# Patient Record
Sex: Male | Born: 1945
Health system: Southern US, Community
[De-identification: ages and names within clinical notes are randomized; demographics above are authoritative.]

## PROBLEM LIST (undated history)

## (undated) DIAGNOSIS — Z8739 Personal history of other diseases of the musculoskeletal system and connective tissue: Secondary | ICD-10-CM

## (undated) DIAGNOSIS — R109 Unspecified abdominal pain: Secondary | ICD-10-CM

## (undated) DIAGNOSIS — I10 Essential (primary) hypertension: Secondary | ICD-10-CM

## (undated) DIAGNOSIS — M5136 Other intervertebral disc degeneration, lumbar region: Secondary | ICD-10-CM

## (undated) DIAGNOSIS — C801 Malignant (primary) neoplasm, unspecified: Secondary | ICD-10-CM

## (undated) DIAGNOSIS — K219 Gastro-esophageal reflux disease without esophagitis: Secondary | ICD-10-CM

## (undated) DIAGNOSIS — I2510 Atherosclerotic heart disease of native coronary artery without angina pectoris: Secondary | ICD-10-CM

## (undated) DIAGNOSIS — N201 Calculus of ureter: Secondary | ICD-10-CM

## (undated) DIAGNOSIS — H9190 Unspecified hearing loss, unspecified ear: Secondary | ICD-10-CM

## (undated) DIAGNOSIS — M545 Low back pain, unspecified: Secondary | ICD-10-CM

## (undated) DIAGNOSIS — H269 Unspecified cataract: Secondary | ICD-10-CM

## (undated) DIAGNOSIS — R0781 Pleurodynia: Secondary | ICD-10-CM

## (undated) DIAGNOSIS — M1990 Unspecified osteoarthritis, unspecified site: Secondary | ICD-10-CM

## (undated) DIAGNOSIS — Z973 Presence of spectacles and contact lenses: Secondary | ICD-10-CM

## (undated) DIAGNOSIS — K649 Unspecified hemorrhoids: Secondary | ICD-10-CM

## (undated) DIAGNOSIS — D649 Anemia, unspecified: Secondary | ICD-10-CM

## (undated) DIAGNOSIS — Z87442 Personal history of urinary calculi: Secondary | ICD-10-CM

## (undated) DIAGNOSIS — G7000 Myasthenia gravis without (acute) exacerbation: Secondary | ICD-10-CM

## (undated) DIAGNOSIS — N200 Calculus of kidney: Secondary | ICD-10-CM

## (undated) DIAGNOSIS — E782 Mixed hyperlipidemia: Secondary | ICD-10-CM

## (undated) DIAGNOSIS — I119 Hypertensive heart disease without heart failure: Secondary | ICD-10-CM

## (undated) DIAGNOSIS — K921 Melena: Secondary | ICD-10-CM

## (undated) DIAGNOSIS — E785 Hyperlipidemia, unspecified: Secondary | ICD-10-CM

## (undated) DIAGNOSIS — M51369 Other intervertebral disc degeneration, lumbar region without mention of lumbar back pain or lower extremity pain: Secondary | ICD-10-CM

## (undated) DIAGNOSIS — I4891 Unspecified atrial fibrillation: Secondary | ICD-10-CM

## (undated) DIAGNOSIS — Z8619 Personal history of other infectious and parasitic diseases: Secondary | ICD-10-CM

## (undated) DIAGNOSIS — M722 Plantar fascial fibromatosis: Secondary | ICD-10-CM

## (undated) HISTORY — PX: VASECTOMY: SHX75

## (undated) HISTORY — DX: Unspecified atrial fibrillation: I48.91

## (undated) HISTORY — DX: Plantar fascial fibromatosis: M72.2

## (undated) HISTORY — PX: JOINT REPLACEMENT: SHX530

## (undated) HISTORY — PX: COLONOSCOPY: SHX174

## (undated) HISTORY — PX: OTHER SURGICAL HISTORY: SHX169

## (undated) HISTORY — DX: Low back pain: M54.5

## (undated) HISTORY — DX: Hypertensive heart disease without heart failure: I11.9

## (undated) HISTORY — DX: Myasthenia gravis without (acute) exacerbation: G70.00

## (undated) HISTORY — DX: Pleurodynia: R07.81

## (undated) HISTORY — DX: Mixed hyperlipidemia: E78.2

## (undated) HISTORY — PX: TOTAL KNEE ARTHROPLASTY: SHX125

## (undated) HISTORY — PX: BACK SURGERY: SHX140

## (undated) HISTORY — DX: Melena: K92.1

## (undated) HISTORY — DX: Personal history of other infectious and parasitic diseases: Z86.19

## (undated) HISTORY — DX: Essential (primary) hypertension: I10

## (undated) HISTORY — PX: LITHOTRIPSY: SUR834

## (undated) HISTORY — DX: Atherosclerotic heart disease of native coronary artery without angina pectoris: I25.10

## (undated) HISTORY — DX: Low back pain, unspecified: M54.50

---
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
## 1998-09-06 ENCOUNTER — Ambulatory Visit (HOSPITAL_COMMUNITY): Admission: RE | Admit: 1998-09-06 | Discharge: 1998-09-06 | Payer: Self-pay | Admitting: *Deleted

## 1999-12-24 ENCOUNTER — Ambulatory Visit (HOSPITAL_COMMUNITY): Admission: RE | Admit: 1999-12-24 | Discharge: 1999-12-24 | Payer: Self-pay | Admitting: Urology

## 1999-12-24 ENCOUNTER — Encounter: Payer: Self-pay | Admitting: Urology

## 2001-04-13 ENCOUNTER — Encounter: Payer: Self-pay | Admitting: Orthopedic Surgery

## 2001-04-18 ENCOUNTER — Inpatient Hospital Stay (HOSPITAL_COMMUNITY): Admission: RE | Admit: 2001-04-18 | Discharge: 2001-04-28 | Payer: Self-pay | Admitting: Orthopedic Surgery

## 2001-04-18 HISTORY — PX: TOTAL KNEE ARTHROPLASTY: SHX125

## 2001-04-23 ENCOUNTER — Encounter: Payer: Self-pay | Admitting: Orthopedic Surgery

## 2001-04-24 ENCOUNTER — Encounter: Payer: Self-pay | Admitting: Interventional Cardiology

## 2001-05-15 ENCOUNTER — Encounter: Admission: RE | Admit: 2001-05-15 | Discharge: 2001-06-14 | Payer: Self-pay | Admitting: Orthopedic Surgery

## 2004-10-26 ENCOUNTER — Ambulatory Visit (HOSPITAL_COMMUNITY): Admission: RE | Admit: 2004-10-26 | Discharge: 2004-10-26 | Payer: Self-pay | Admitting: Internal Medicine

## 2004-10-26 ENCOUNTER — Ambulatory Visit: Payer: Self-pay | Admitting: Internal Medicine

## 2005-01-13 ENCOUNTER — Ambulatory Visit (HOSPITAL_COMMUNITY): Admission: RE | Admit: 2005-01-13 | Discharge: 2005-01-13 | Payer: Self-pay | Admitting: Pulmonary Disease

## 2005-10-18 ENCOUNTER — Encounter: Admission: RE | Admit: 2005-10-18 | Discharge: 2005-10-18 | Payer: Self-pay | Admitting: Surgery

## 2009-07-09 ENCOUNTER — Ambulatory Visit (HOSPITAL_COMMUNITY): Admission: RE | Admit: 2009-07-09 | Discharge: 2009-07-09 | Payer: Self-pay | Admitting: Pulmonary Disease

## 2010-09-06 ENCOUNTER — Encounter: Payer: Self-pay | Admitting: Pulmonary Disease

## 2011-01-01 NOTE — Op Note (Signed)
[place]  Patient:    Brandon [name], MESA Visit Number: [id number] MRN: [id number]          Service Type: SUR Location: RCRM 2550 03 Attending Physician:  Cornell Barman Proc. Date: 04/18/01 Admit Date:  04/18/2001                             Operative Report  PREOPERATIVE DIAGNOSIS:  Severe osteoarthritis left knee.  POSTOPERATIVE DIAGNOSIS:  Severe osteoarthritis left knee.  PROCEDURE PERFORMED:  Left total knee replacement on the left using an LCS large size femoral component with a size 5 cemented keel tray with a 10 mm poly insert and a three peg cemented rotating patella large size.  SURGEON:  Lenard Galloway. Mortenson, M.D.  ASSISTANT:  ANESTHESIA:  General.  DESCRIPTION OF PROCEDURE:  After satisfactory general anesthesia, the patient was placed on the operating table in the supine position with a pneumatic tourniquet to the left upper thigh.  The entire left lower extremity was prepped with Duraprep and draped in the usual manner.  The leg was wrapped with an Esmarch and the tourniquet elevated.  A straight incision was made starting above the patella and carried down to the tibial tubercle.  Skin edges were retracted.  Bleeders were coagulated.  A long medium parapatellar incision was made with the Bovie.  The patella was everted and bone spurs removed from both femoral condyles and enlarged inside the patella.  The knee was then flexed at 90 degrees.  Both medial and lateral meniscus were excised. A tibial guide #1 was passed over the anterior aspect of the tibia and this was felt to be the proper cutting level.  The cutting guide was then fixed with fixation pins.  The external tunneler was applied and excellent alignment on the tibia was achieved.  At this point, the capture guide was used and the proximal tibia was amputated.  Excellent cut of the tibia was achieved.  All bone was removed.  The meniscus, anterior, and  posterior cruciate ligaments were totally released.  Excellent access was achieved.  Attention was then turned to the femur.  A local edge synovectomy with severe pouch was done.  The notch was cut over the anterior aspect of the femur and a femoral guide #1 was placed over the anterior aspect of the femur with the distal end.  This was held in position and a drill hole was made.  The intermedullary rod was inserted.  A C clamp was inserted and the knee flexed to 90 degrees and this set up a proper rotation of the femoral component. This was then locked into position with fixation pins.  Using the capture guide anterior and posterior cuts on the distal femur was achieved.  At this point, a 10 mm spacer block was inserted between the femur and the tibia with the knee flexed and this seated very nicely with excellent balancing of both medial and lateral collateral ligaments.  Guide #2 was placed over the distal end of the femur and held in position with the intermedullary rod.  Once this was seated, fixation pins were inserted.  The intermedullary rod was removed, the knee flexed, the capture guide applied and the femur was amputated.  With the knee fully extended, a spacer block was inserted and in full extension, there was excellent balancing of both the medial and lateral collateral ligaments, and they balanced nicely with the ligaments in  flexion.  Femoral guide #3 was placed over the distal end of the femur, fixed in place with fixation pins, and Chamfer cuts were made and drill holes made.  The guide was then removed and all loose bone was removed.  A McKale retractor was then used to sublux the tibia forward and placed laterally.  This gave excellent access to the proximal tibia.  A size 5 tibial tray seemed to fit very nicely.  This was held in position and fixed with fixation pins.  Power was applied and impacted.  Using the verse drill, a hole was made for the keel.  The  tunneler was removed and this Finn device was inserted and placement made before the finn.  The trial poly insert was inserted in place.  A femoral trial was placed over this to end the femur and this seated very nicely.  The knee was then put through a full range of motion.  There was excellent stability, flexion, and extension.  No drawer was noted.  Excellent balance of the femoral ligaments.  On the posterior aspect of the patella, which was amputated using the patella guide.  Three drill holes were placed in the posterior aspect of the patella and patella trial was inserted.  The knee was then put through a range of motion once again.  A lateral release was not done as the patella tracked very nicely and there was no lateral tilt.  All the components were removed.  Saline lavage was used in the anterior solution. All debris was removed.  Superficial bleeders were coagulated.  Glue was mixed.  Each component was then sequentially inserted starting with the tibia then trial tibial tray and glue was placed over the distal end of the femur and the femoral component was inserted.  All excess glue was removed.  With the fully bended, glue was placed on the posterior aspect of the patella.  The patella prosthesis was placed with the clamp.  All excess glue was removed from all the components.  Once the glue had hardened, a small osteotome was used to remove excess glue.  Excellent position and balancing of all the ligaments and the components was achieved.  The trial poly was then removed. Tourniquet dropped and all bleeders coagulated.  The knee was then irrigated again with pulse saline lavage and antibiotic solution.  The final poly was then snap fitted in place and the knee put through a full range of motion.  a Hemovac drain was inserted.  With the knee flexed 90 degrees, the long median parapatellar incision was closed with interrupted Ethibond sutures.  Vicryl was used to close the  subcutaneous tissue and stainless steel staples were used to close the skin.  Drains were one Hemovac.  Complications none. Attending Physician:  Cornell Barman DD:  04/18/01 TD:  04/18/01 Job: [id number]  [id number]
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
## 2011-01-08 ENCOUNTER — Other Ambulatory Visit: Payer: Self-pay | Admitting: Neurosurgery

## 2011-01-08 DIAGNOSIS — M47816 Spondylosis without myelopathy or radiculopathy, lumbar region: Secondary | ICD-10-CM

## 2011-01-13 ENCOUNTER — Ambulatory Visit
Admission: RE | Admit: 2011-01-13 | Discharge: 2011-01-13 | Disposition: A | Discharge status: Home / Self Care | Payer: Medicare Other | Source: Ambulatory Visit | Attending: Neurosurgery | Admitting: Neurosurgery

## 2011-01-13 DIAGNOSIS — M47816 Spondylosis without myelopathy or radiculopathy, lumbar region: Secondary | ICD-10-CM

## 2011-03-09 ENCOUNTER — Other Ambulatory Visit: Payer: Self-pay | Admitting: Neurosurgery

## 2011-03-09 DIAGNOSIS — M47816 Spondylosis without myelopathy or radiculopathy, lumbar region: Secondary | ICD-10-CM

## 2011-04-26 ENCOUNTER — Ambulatory Visit
Admission: RE | Admit: 2011-04-26 | Discharge: 2011-04-26 | Disposition: A | Discharge status: Home / Self Care | Payer: Medicare Other | Source: Ambulatory Visit | Attending: Neurosurgery | Admitting: Neurosurgery

## 2011-04-26 DIAGNOSIS — M47816 Spondylosis without myelopathy or radiculopathy, lumbar region: Secondary | ICD-10-CM

## 2011-04-26 MED ORDER — IOHEXOL 180 MG/ML  SOLN
1.0000 mL | Freq: Once | INTRAMUSCULAR | Status: AC | PRN
  Administered 2011-04-26: 1 mL via EPIDURAL

## 2011-04-26 MED ORDER — METHYLPREDNISOLONE ACETATE 40 MG/ML INJ SUSP (RADIOLOG
120.0000 mg | Freq: Once | INTRAMUSCULAR | Status: AC
  Administered 2011-04-26: 120 mg via EPIDURAL

## 2011-08-25 DIAGNOSIS — Z0000 Encounter for general adult medical examination without abnormal findings: Secondary | ICD-10-CM | POA: Diagnosis not present

## 2011-08-25 DIAGNOSIS — Z125 Encounter for screening for malignant neoplasm of prostate: Secondary | ICD-10-CM | POA: Diagnosis not present

## 2011-08-30 DIAGNOSIS — M25539 Pain in unspecified wrist: Secondary | ICD-10-CM | POA: Diagnosis not present

## 2011-08-30 DIAGNOSIS — M542 Cervicalgia: Secondary | ICD-10-CM | POA: Diagnosis not present

## 2011-08-30 DIAGNOSIS — G5600 Carpal tunnel syndrome, unspecified upper limb: Secondary | ICD-10-CM | POA: Diagnosis not present

## 2011-09-07 DIAGNOSIS — M47817 Spondylosis without myelopathy or radiculopathy, lumbosacral region: Secondary | ICD-10-CM | POA: Diagnosis not present

## 2011-09-07 DIAGNOSIS — M4310 Spondylolisthesis, site unspecified: Secondary | ICD-10-CM | POA: Diagnosis not present

## 2011-09-16 DIAGNOSIS — G5600 Carpal tunnel syndrome, unspecified upper limb: Secondary | ICD-10-CM | POA: Diagnosis not present

## 2011-11-09 DIAGNOSIS — M25519 Pain in unspecified shoulder: Secondary | ICD-10-CM | POA: Diagnosis not present

## 2011-11-09 DIAGNOSIS — S4350XA Sprain of unspecified acromioclavicular joint, initial encounter: Secondary | ICD-10-CM | POA: Diagnosis not present

## 2011-11-10 DIAGNOSIS — S4350XA Sprain of unspecified acromioclavicular joint, initial encounter: Secondary | ICD-10-CM | POA: Diagnosis not present

## 2011-11-15 ENCOUNTER — Encounter (HOSPITAL_COMMUNITY): Payer: Self-pay

## 2011-11-23 NOTE — Pre-Procedure Instructions (Signed)
20 Brandon [name]   11/23/2011   Your procedure is scheduled on:  Friday, April 12th   Report to [facility] at  10:30  AM.   Call this number if you have problems the morning of surgery: [phone]   Remember:   Do not eat food:After Midnight Thursday .  May have clear liquids: up to 4 Hours before arrival time.Marland Kitchen6:30 AM.  Clear liquids include soda, tea, black coffee, apple or grape juice, broth.   Take these medicines the morning of surgery with A SIP OF WATER:  Lopressor, omeprazole   Do not wear jewelry, make-up or nail polish.   Do not wear lotions, powders, or perfumes. You may wear deodorant.  Do not shave 48 hours prior to surgery.  Do not bring valuables to the hospital.   Contacts, dentures or bridgework may not be worn into surgery.  Leave suitcase in the car. After surgery it may be brought to your room.  For patients admitted to the hospital, checkout time is 11:00 AM the day of discharge.   Patients discharged the day of surgery will not be allowed to drive home.  Name and phone number of your driver:   Special Instructions: CHG Shower Use Special Wash: 1/2 bottle night before surgery and 1/2 bottle morning of surgery.   Please read over the following fact sheets that you were given: Pain Booklet, MRSA Information and Surgical Site Infection Prevention

## 2011-11-23 NOTE — H&P (Signed)
CC: left shoulder pain and weakness HPI: 66 y/o male with worsening left shoulder pain and weakness due to torn rotator cuff, pt has elected for a left shoulder scope and attempt for cuff repair PMH: hypertension, gout, kidney stones, GERD Allergy: NKDA Meds: HCTZ, lopressor, pravachol, ompeprazole, vitamins, aspirin, fish oil, potassium, allopurinol, mobic, tylenol  Family: cancer, chf, coronary heart disease Social: occasional etoh, no illicit drugs, former tobacco ROS: pain and weakness left shoulder s/p cuff tear PE: alert and appropriate 65 y/o male in no acute distress Cervical: full rom cranial nerves 2-12 intact Left shoulder: FF 90 ER 30 nv intact distally, weakness ER 3/5 as compared to right  X-rays: left shoulder rotator cuff tear Assessment: left shoulder rotator cuff tear Plan: left shoulder scope and attempt at rotator cuff repair

## 2011-11-24 ENCOUNTER — Encounter (HOSPITAL_COMMUNITY)
Admission: RE | Admit: 2011-11-24 | Discharge: 2011-11-24 | Disposition: A | Discharge status: Home / Self Care | Payer: Medicare Other | Source: Ambulatory Visit | Attending: Orthopedic Surgery | Admitting: Orthopedic Surgery

## 2011-11-24 ENCOUNTER — Encounter (HOSPITAL_COMMUNITY): Payer: Self-pay

## 2011-11-24 ENCOUNTER — Encounter (HOSPITAL_COMMUNITY)
Admission: RE | Admit: 2011-11-24 | Discharge: 2011-11-24 | Disposition: A | Discharge status: Home / Self Care | Payer: Medicare Other | Source: Ambulatory Visit | Attending: Anesthesiology | Admitting: Anesthesiology

## 2011-11-24 DIAGNOSIS — Z01812 Encounter for preprocedural laboratory examination: Secondary | ICD-10-CM | POA: Diagnosis not present

## 2011-11-24 DIAGNOSIS — Z01818 Encounter for other preprocedural examination: Secondary | ICD-10-CM | POA: Diagnosis not present

## 2011-11-24 DIAGNOSIS — M25819 Other specified joint disorders, unspecified shoulder: Secondary | ICD-10-CM | POA: Diagnosis not present

## 2011-11-24 DIAGNOSIS — IMO0001 Reserved for inherently not codable concepts without codable children: Secondary | ICD-10-CM | POA: Diagnosis not present

## 2011-11-24 DIAGNOSIS — M719 Bursopathy, unspecified: Secondary | ICD-10-CM | POA: Diagnosis not present

## 2011-11-24 DIAGNOSIS — M67919 Unspecified disorder of synovium and tendon, unspecified shoulder: Secondary | ICD-10-CM | POA: Diagnosis not present

## 2011-11-24 DIAGNOSIS — Z01810 Encounter for preprocedural cardiovascular examination: Secondary | ICD-10-CM | POA: Diagnosis not present

## 2011-11-24 DIAGNOSIS — Z5333 Arthroscopic surgical procedure converted to open procedure: Secondary | ICD-10-CM | POA: Diagnosis not present

## 2011-11-24 DIAGNOSIS — M19019 Primary osteoarthritis, unspecified shoulder: Secondary | ICD-10-CM | POA: Diagnosis not present

## 2011-11-24 DIAGNOSIS — I10 Essential (primary) hypertension: Secondary | ICD-10-CM | POA: Diagnosis not present

## 2011-11-24 DIAGNOSIS — Z01811 Encounter for preprocedural respiratory examination: Secondary | ICD-10-CM | POA: Diagnosis not present

## 2011-11-24 DIAGNOSIS — K219 Gastro-esophageal reflux disease without esophagitis: Secondary | ICD-10-CM | POA: Diagnosis not present

## 2011-11-24 DIAGNOSIS — I7090 Unspecified atherosclerosis: Secondary | ICD-10-CM | POA: Diagnosis not present

## 2011-11-24 HISTORY — DX: Other intervertebral disc degeneration, lumbar region: M51.36

## 2011-11-24 HISTORY — DX: Unspecified osteoarthritis, unspecified site: M19.90

## 2011-11-24 HISTORY — DX: Other intervertebral disc degeneration, lumbar region without mention of lumbar back pain or lower extremity pain: M51.369

## 2011-11-24 HISTORY — DX: Gastro-esophageal reflux disease without esophagitis: K21.9

## 2011-11-24 LAB — BASIC METABOLIC PANEL
BUN: 21 mg/dL (ref 6–23)
CO2: 28 mEq/L (ref 19–32)
Calcium: 9.6 mg/dL (ref 8.4–10.5)
Chloride: 103 mEq/L (ref 96–112)
Creatinine, Ser: 0.8 mg/dL (ref 0.50–1.35)
GFR calc Af Amer: >90 mL/min (ref >90)
GFR calc non Af Amer: >90 mL/min (ref >90)
Glucose, Bld: 109 mg/dL — ABNORMAL HIGH (ref 70–99)
Potassium: 3.2 mEq/L — ABNORMAL LOW (ref 3.5–5.1)
Sodium: 141 mEq/L (ref 135–145)

## 2011-11-24 LAB — CBC
HCT: 44.1 % (ref 39.0–52.0)
Hemoglobin: 15.1 g/dL (ref 13.0–17.0)
MCH: 31 pg (ref 26.0–34.0)
MCHC: 34.2 g/dL (ref 30.0–36.0)
MCV: 90.6 fL (ref 78.0–100.0)
Platelets: 188 10*3/uL (ref 150–400)
RBC: 4.87 MIL/uL (ref 4.22–5.81)
RDW: 13.5 % (ref 11.5–15.5)
WBC: 5.3 10*3/uL (ref 4.0–10.5)

## 2011-11-24 LAB — SURGICAL PCR SCREEN
MRSA, PCR: NEGATIVE
Staphylococcus aureus: POSITIVE — AB

## 2011-11-24 MED ORDER — CHLORHEXIDINE GLUCONATE 4 % EX LIQD: 60.0000 mL | Freq: Once | CUTANEOUS | Status: DC

## 2011-11-25 MED ORDER — CEFAZOLIN SODIUM-DEXTROSE 2-3 GM-% IV SOLR
2.0000 g | INTRAVENOUS | Status: DC
  Filled 2011-11-25: qty 50

## 2011-11-25 NOTE — Consult Note (Addendum)
Anesthesia:  Patient is a 66 year old male scheduled for a left shoulder arthroscopy, rotator cuff repair on 11/26/11. History includes former smoker, CKD (with normal BUN/CR 11/24/11), kidney stones, HTN, GERD, DDD, arthritis.  Labs acceptable.  CXR from 11/24/11 shows: No acute infiltrate or pleural effusion. No pulmonary edema.  There is probable pleural scarring or rib calcification in the  right upper lobe. I cannot exclude prior injury of the right first  rib. Clinical correlation is necessary. Follow-up examination in  2 months is recommended to assure stability. Mild atherosclerotic calcifications of the thoracic aorta. Defer instructions for further evaluation to Dr. Ranell Patrick.  EKG from 11/24/11 shows SB @ 52bpm, possible inferior infarct.  Inferior changes have been present since at least 2002, and had a normal stress test at that time, EF 58%.  No CV symptoms reported.  No documented history of CAD/CHF or DM.  Anticipate he can proceed as planned.

## 2011-11-26 ENCOUNTER — Encounter (HOSPITAL_COMMUNITY)
Admission: RE | Disposition: A | Discharge status: Home / Self Care | Payer: Self-pay | Source: Ambulatory Visit | Attending: Orthopedic Surgery

## 2011-11-26 ENCOUNTER — Encounter (HOSPITAL_COMMUNITY): Payer: Self-pay | Admitting: Vascular Surgery

## 2011-11-26 ENCOUNTER — Ambulatory Visit (HOSPITAL_COMMUNITY): Payer: Medicare Other | Admitting: Vascular Surgery

## 2011-11-26 ENCOUNTER — Ambulatory Visit (HOSPITAL_COMMUNITY)
Admission: RE | Admit: 2011-11-26 | Discharge: 2011-11-26 | Disposition: A | Discharge status: Home / Self Care | Payer: Medicare Other | Source: Ambulatory Visit | Attending: Orthopedic Surgery | Admitting: Orthopedic Surgery

## 2011-11-26 DIAGNOSIS — X58XXXS Exposure to other specified factors, sequela: Secondary | ICD-10-CM | POA: Insufficient documentation

## 2011-11-26 DIAGNOSIS — M719 Bursopathy, unspecified: Secondary | ICD-10-CM | POA: Diagnosis not present

## 2011-11-26 DIAGNOSIS — M19019 Primary osteoarthritis, unspecified shoulder: Secondary | ICD-10-CM | POA: Insufficient documentation

## 2011-11-26 DIAGNOSIS — Z01811 Encounter for preprocedural respiratory examination: Secondary | ICD-10-CM | POA: Insufficient documentation

## 2011-11-26 DIAGNOSIS — Z5333 Arthroscopic surgical procedure converted to open procedure: Secondary | ICD-10-CM | POA: Insufficient documentation

## 2011-11-26 DIAGNOSIS — Z01810 Encounter for preprocedural cardiovascular examination: Secondary | ICD-10-CM | POA: Insufficient documentation

## 2011-11-26 DIAGNOSIS — Z01812 Encounter for preprocedural laboratory examination: Secondary | ICD-10-CM | POA: Insufficient documentation

## 2011-11-26 DIAGNOSIS — M75110 Incomplete rotator cuff tear or rupture of unspecified shoulder, not specified as traumatic: Secondary | ICD-10-CM | POA: Diagnosis not present

## 2011-11-26 DIAGNOSIS — S43439A Superior glenoid labrum lesion of unspecified shoulder, initial encounter: Secondary | ICD-10-CM | POA: Diagnosis not present

## 2011-11-26 DIAGNOSIS — G8918 Other acute postprocedural pain: Secondary | ICD-10-CM | POA: Diagnosis not present

## 2011-11-26 DIAGNOSIS — K219 Gastro-esophageal reflux disease without esophagitis: Secondary | ICD-10-CM | POA: Insufficient documentation

## 2011-11-26 DIAGNOSIS — Z01818 Encounter for other preprocedural examination: Secondary | ICD-10-CM | POA: Insufficient documentation

## 2011-11-26 DIAGNOSIS — M67919 Unspecified disorder of synovium and tendon, unspecified shoulder: Secondary | ICD-10-CM | POA: Diagnosis not present

## 2011-11-26 DIAGNOSIS — M7500 Adhesive capsulitis of unspecified shoulder: Secondary | ICD-10-CM | POA: Diagnosis not present

## 2011-11-26 DIAGNOSIS — M25819 Other specified joint disorders, unspecified shoulder: Secondary | ICD-10-CM | POA: Insufficient documentation

## 2011-11-26 DIAGNOSIS — IMO0001 Reserved for inherently not codable concepts without codable children: Secondary | ICD-10-CM | POA: Insufficient documentation

## 2011-11-26 DIAGNOSIS — M66329 Spontaneous rupture of flexor tendons, unspecified upper arm: Secondary | ICD-10-CM | POA: Diagnosis not present

## 2011-11-26 DIAGNOSIS — I10 Essential (primary) hypertension: Secondary | ICD-10-CM | POA: Insufficient documentation

## 2011-11-26 HISTORY — PX: BICEPT TENODESIS: SHX5116

## 2011-11-26 SURGERY — SHOULDER ARTHROSCOPY WITH OPEN ROTATOR CUFF REPAIR AND DISTAL CLAVICLE ACROMINECTOMY
Anesthesia: General | Site: Shoulder | Laterality: Left | Wound class: Clean

## 2011-11-26 MED ORDER — NEOSTIGMINE METHYLSULFATE 1 MG/ML IJ SOLN
INTRAMUSCULAR | Status: DC | PRN
  Administered 2011-11-26: 2.5 mg via INTRAVENOUS

## 2011-11-26 MED ORDER — BUPIVACAINE-EPINEPHRINE 0.25% -1:200000 IJ SOLN
INTRAMUSCULAR | Status: DC | PRN
  Administered 2011-11-26: 6 mL

## 2011-11-26 MED ORDER — METHOCARBAMOL 500 MG PO TABS: 500.0000 mg | ORAL_TABLET | Freq: Three times a day (TID) | ORAL | Status: AC | PRN

## 2011-11-26 MED ORDER — LACTATED RINGERS IV SOLN
INTRAVENOUS | Status: DC
  Administered 2011-11-26: 12:00:00 via INTRAVENOUS

## 2011-11-26 MED ORDER — FENTANYL CITRATE 0.05 MG/ML IJ SOLN
INTRAMUSCULAR | Status: AC
  Filled 2011-11-26: qty 2

## 2011-11-26 MED ORDER — ONDANSETRON HCL 4 MG/2ML IJ SOLN
INTRAMUSCULAR | Status: DC | PRN
  Administered 2011-11-26: 4 mg via INTRAVENOUS

## 2011-11-26 MED ORDER — SODIUM CHLORIDE 0.9 % IV SOLN: INTRAVENOUS | Status: DC

## 2011-11-26 MED ORDER — SUFENTANIL CITRATE 50 MCG/ML IV SOLN
INTRAVENOUS | Status: DC | PRN
  Administered 2011-11-26: 10 ug via INTRAVENOUS
  Administered 2011-11-26: 20 ug via INTRAVENOUS

## 2011-11-26 MED ORDER — SODIUM CHLORIDE 0.9 % IR SOLN
Status: DC | PRN
  Administered 2011-11-26 (×2): 3000 mL
  Administered 2011-11-26: 1000 mL
  Administered 2011-11-26: 3000 mL

## 2011-11-26 MED ORDER — MIDAZOLAM HCL 2 MG/2ML IJ SOLN
INTRAMUSCULAR | Status: AC
  Filled 2011-11-26: qty 2

## 2011-11-26 MED ORDER — HYDROMORPHONE HCL PF 1 MG/ML IJ SOLN: 0.2500 mg | INTRAMUSCULAR | Status: DC | PRN

## 2011-11-26 MED ORDER — ONDANSETRON HCL 4 MG/2ML IJ SOLN: 4.0000 mg | Freq: Once | INTRAMUSCULAR | Status: DC | PRN

## 2011-11-26 MED ORDER — ROCURONIUM BROMIDE 100 MG/10ML IV SOLN
INTRAVENOUS | Status: DC | PRN
  Administered 2011-11-26: 50 mg via INTRAVENOUS

## 2011-11-26 MED ORDER — HYDRALAZINE HCL 20 MG/ML IJ SOLN
10.0000 mg | Freq: Once | INTRAMUSCULAR | Status: AC
  Administered 2011-11-26: 10 mg via INTRAVENOUS
  Filled 2011-11-26 (×2): qty 0.5

## 2011-11-26 MED ORDER — PHENYLEPHRINE HCL 10 MG/ML IJ SOLN
INTRAMUSCULAR | Status: DC | PRN
  Administered 2011-11-26 (×2): 40 ug via INTRAVENOUS
  Administered 2011-11-26: 80 ug via INTRAVENOUS

## 2011-11-26 MED ORDER — EPHEDRINE SULFATE 50 MG/ML IJ SOLN
INTRAMUSCULAR | Status: DC | PRN
  Administered 2011-11-26: 5 mg via INTRAVENOUS
  Administered 2011-11-26: 15 mg via INTRAVENOUS
  Administered 2011-11-26 (×2): 5 mg via INTRAVENOUS

## 2011-11-26 MED ORDER — PROPOFOL 10 MG/ML IV EMUL
INTRAVENOUS | Status: DC | PRN
  Administered 2011-11-26: 150 mg via INTRAVENOUS
  Administered 2011-11-26: 50 mg via INTRAVENOUS

## 2011-11-26 MED ORDER — MIDAZOLAM HCL 2 MG/2ML IJ SOLN
1.0000 mg | INTRAMUSCULAR | Status: DC | PRN
  Administered 2011-11-26: 2 mg via INTRAVENOUS

## 2011-11-26 MED ORDER — OXYCODONE-ACETAMINOPHEN 5-325 MG PO TABS: 1.0000 | ORAL_TABLET | ORAL | Status: AC | PRN

## 2011-11-26 MED ORDER — FENTANYL CITRATE 0.05 MG/ML IJ SOLN
50.0000 ug | INTRAMUSCULAR | Status: DC | PRN
  Administered 2011-11-26: 100 ug via INTRAVENOUS

## 2011-11-26 MED ORDER — GLYCOPYRROLATE 0.2 MG/ML IJ SOLN
INTRAMUSCULAR | Status: DC | PRN
  Administered 2011-11-26: .4 mg via INTRAVENOUS

## 2011-11-26 MED ORDER — CEFAZOLIN SODIUM 1-5 GM-% IV SOLN
INTRAVENOUS | Status: DC | PRN
  Administered 2011-11-26: 2 g via INTRAVENOUS

## 2011-11-26 MED ORDER — LACTATED RINGERS IV SOLN
INTRAVENOUS | Status: DC | PRN
  Administered 2011-11-26 (×3): via INTRAVENOUS

## 2011-11-26 SURGICAL SUPPLY — 75 items
ANCH SUT 1.4 1 LD SFT TIS (Anchor) ×1 IMPLANT
ANCHOR JUGGERKNOT SZ1 (Anchor) ×1 IMPLANT
ANCHOR SUT CROSSFT 4.5 #2 (Anchor) ×1 IMPLANT
BLADE LONG MED 31X9 (MISCELLANEOUS) ×1 IMPLANT
BLADE SURG 11 STRL SS (BLADE) ×2 IMPLANT
BUR OVAL 4.0 (BURR) ×1 IMPLANT
CLOTH BEACON ORANGE TIMEOUT ST (SAFETY) ×2 IMPLANT
CLSR STERI-STRIP ANTIMIC 1/2X4 (GAUZE/BANDAGES/DRESSINGS) ×2 IMPLANT
COVER SURGICAL LIGHT HANDLE (MISCELLANEOUS) ×2 IMPLANT
DRAPE INCISE IOBAN 66X45 STRL (DRAPES) ×2 IMPLANT
DRAPE STERI 35X30 U-POUCH (DRAPES) ×2 IMPLANT
DRAPE U-SHAPE 47X51 STRL (DRAPES) ×2 IMPLANT
DRILL BIT 5/64 (BIT) ×2 IMPLANT
DRSG EMULSION OIL 3X3 NADH (GAUZE/BANDAGES/DRESSINGS) ×3 IMPLANT
DRSG PAD ABDOMINAL 8X10 ST (GAUZE/BANDAGES/DRESSINGS) ×4 IMPLANT
DURAPREP 26ML APPLICATOR (WOUND CARE) ×2 IMPLANT
ELECT NDL TIP 2.8 STRL (NEEDLE) ×1 IMPLANT
ELECT NEEDLE TIP 2.8 STRL (NEEDLE) ×2 IMPLANT
ELECT REM PT RETURN 9FT ADLT (ELECTROSURGICAL) ×2
ELECTRODE REM PT RTRN 9FT ADLT (ELECTROSURGICAL) IMPLANT
FLUID NSS /IRRIG 3000 ML XXX (IV SOLUTION) ×3 IMPLANT
GLOVE BIOGEL PI ORTHO PRO 7.5 (GLOVE) ×1
GLOVE BIOGEL PI ORTHO PRO SZ7 (GLOVE) ×1
GLOVE BIOGEL PI ORTHO PRO SZ8 (GLOVE) ×2
GLOVE ORTHO TXT STRL SZ7.5 (GLOVE) ×2 IMPLANT
GLOVE PI ORTHO PRO STRL 7.5 (GLOVE) ×1 IMPLANT
GLOVE PI ORTHO PRO STRL SZ7 (GLOVE) IMPLANT
GLOVE PI ORTHO PRO STRL SZ8 (GLOVE) ×1 IMPLANT
GLOVE SURG ORTHO 8.5 STRL (GLOVE) ×2 IMPLANT
GLOVE SURG SS PI 7.0 STRL IVOR (GLOVE) ×1 IMPLANT
GOWN STRL NON-REIN LRG LVL3 (GOWN DISPOSABLE) ×2 IMPLANT
GOWN STRL REIN XL XLG (GOWN DISPOSABLE) ×5 IMPLANT
KIT BASIN OR (CUSTOM PROCEDURE TRAY) ×2 IMPLANT
KIT JUGGERKNOT DISP 2.9MM (KITS) IMPLANT
KIT ROOM TURNOVER OR (KITS) ×2 IMPLANT
MANIFOLD NEPTUNE II (INSTRUMENTS) ×2 IMPLANT
NDL 1/2 CIR MAYO (NEEDLE) IMPLANT
NDL HYPO 25GX1X1/2 BEV (NEEDLE) ×1 IMPLANT
NDL SPNL 18GX3.5 QUINCKE PK (NEEDLE) ×1 IMPLANT
NDL SUT 2 .5 CRC MAYO 1.732X (NEEDLE) IMPLANT
NDL SUT 6 .5 CRC .975X.05 MAYO (NEEDLE) IMPLANT
NEEDLE 1/2 CIR MAYO (NEEDLE) ×2 IMPLANT
NEEDLE HYPO 25GX1X1/2 BEV (NEEDLE) ×2 IMPLANT
NEEDLE MAYO TAPER (NEEDLE) ×4
NEEDLE SPNL 18GX3.5 QUINCKE PK (NEEDLE) ×2 IMPLANT
NS IRRIG 1000ML POUR BTL (IV SOLUTION) ×2 IMPLANT
PACK SHOULDER (CUSTOM PROCEDURE TRAY) ×2 IMPLANT
PAD ARMBOARD 7.5X6 YLW CONV (MISCELLANEOUS) ×4 IMPLANT
RESECTOR FULL RADIUS 4.2MM (BLADE) ×1 IMPLANT
SET ARTHROSCOPY TUBING (MISCELLANEOUS) ×2
SET ARTHROSCOPY TUBING LN (MISCELLANEOUS) ×1 IMPLANT
SET JUGGERKNOT DISP 1.4MM ×1 IMPLANT
SLING ARM FOAM STRAP LRG (SOFTGOODS) ×1 IMPLANT
SLING ARM FOAM STRAP MED (SOFTGOODS) IMPLANT
SPONGE GAUZE 4X4 12PLY (GAUZE/BANDAGES/DRESSINGS) ×2 IMPLANT
SPONGE LAP 4X18 X RAY DECT (DISPOSABLE) ×1 IMPLANT
STRIP CLOSURE SKIN 1/2X4 (GAUZE/BANDAGES/DRESSINGS) ×1 IMPLANT
SUCTION FRAZIER TIP 10 FR DISP (SUCTIONS) ×2 IMPLANT
SUT BONE WAX W31G (SUTURE) IMPLANT
SUT FIBERWIRE #2 38 T-5 BLUE (SUTURE)
SUT MNCRL AB 4-0 PS2 18 (SUTURE) ×2 IMPLANT
SUT VIC AB 0 CT1 27 (SUTURE)
SUT VIC AB 0 CT1 27XBRD ANBCTR (SUTURE) IMPLANT
SUT VIC AB 0 CT2 27 (SUTURE) ×1 IMPLANT
SUT VIC AB 2-0 CT1 27 (SUTURE) ×2
SUT VIC AB 2-0 CT1 TAPERPNT 27 (SUTURE) IMPLANT
SUT VICRYL 0 CT 1 36IN (SUTURE) ×6 IMPLANT
SUTURE FIBERWR #2 38 T-5 BLUE (SUTURE) IMPLANT
SYR CONTROL 10ML LL (SYRINGE) ×2 IMPLANT
TAPE CLOTH SURG 4X10 WHT LF (GAUZE/BANDAGES/DRESSINGS) ×1 IMPLANT
TOWEL OR 17X24 6PK STRL BLUE (TOWEL DISPOSABLE) ×2 IMPLANT
TOWEL OR 17X26 10 PK STRL BLUE (TOWEL DISPOSABLE) ×2 IMPLANT
TUBE CONNECTING 12X1/4 (SUCTIONS) ×2 IMPLANT
WAND 90 DEG TURBOVAC W/CORD (SURGICAL WAND) ×2 IMPLANT
WATER STERILE IRR 1000ML POUR (IV SOLUTION) ×2 IMPLANT

## 2011-11-26 NOTE — Anesthesia Preprocedure Evaluation (Signed)
Anesthesia Evaluation  Patient identified by MRN, date of birth, ID band Patient awake    Airway Mallampati: II      Dental  (+) Teeth Intact   Pulmonary  breath sounds clear to auscultation        Cardiovascular Rhythm:Regular Rate:Normal     Neuro/Psych    GI/Hepatic   Endo/Other    Renal/GU      Musculoskeletal   Abdominal   Peds  Hematology   Anesthesia Other Findings   Reproductive/Obstetrics                           Anesthesia Physical Anesthesia Plan  ASA: II  Anesthesia Plan: General   Post-op Pain Management:    Induction: Intravenous  Airway Management Planned: Oral ETT  Additional Equipment:   Intra-op Plan:   Post-operative Plan: Extubation in OR  Informed Consent: I have reviewed the patients History and Physical, chart, labs and discussed the procedure including the risks, benefits and alternatives for the proposed anesthesia with the patient or authorized representative who has indicated his/her understanding and acceptance.   Dental advisory given  Plan Discussed with: CRNA and Surgeon  Anesthesia Plan Comments:         Anesthesia Quick Evaluation

## 2011-11-26 NOTE — Anesthesia Procedure Notes (Addendum)
Anesthesia Regional Block:  Interscalene brachial plexus block  Pre-Anesthetic Checklist: ,, timeout performed, Correct Patient, Correct Site, Correct Laterality, Correct Procedure, Correct Position, site marked, Risks and benefits discussed,  Surgical consent,  Pre-op evaluation,  At surgeon's request and post-op pain management  Laterality: Left  Prep: Maximum Sterile Barrier Precautions used and chloraprep       Needles:  Injection technique: Single-shot  Needle Type: Stimulator Needle - 40      Needle Gauge: 22 and 22 G    Additional Needles:  Procedures: ultrasound guided Interscalene brachial plexus block Narrative:  Start time: 11/26/2011 12:20 PM End time: 11/26/2011 12:30 PM  Performed by: Personally   Additional Notes: 30 cc 0.5% marcaine 1:200 Epi injected without difficulty.  Kipp Brood, MD   Anesthesia Regional Block:  Interscalene brachial plexus blockInterscalene brachial plexus block Narrative:    Anesthesia Regional Block:  Interscalene brachial plexus block  Pre-Anesthetic Checklist: ,, timeout performed, Correct Patient, Correct Site, Correct Laterality, Correct Procedure, Correct Position, site marked, Risks and benefits discussed,  Surgical consent,  Pre-op evaluation,  At surgeon's request and post-op pain management  Laterality: Left  Prep: chloraprep       Needles:  Injection technique: Single-shot  Needle Type: Echogenic Stimulator Needle      Needle Gauge: 22 and 22 G    Additional Needles:  Procedures: ultrasound guided and nerve stimulator Interscalene brachial plexus block Narrative:  Start time: 11/26/2011 12:20 PM End time: 11/26/2011 12:30 PM  Performed by: Personally   Additional Notes: 30 cc 0.5% Marcaine with 1:200 Epi injected without difficulty.  Kipp Brood, MD

## 2011-11-26 NOTE — Progress Notes (Signed)
Orthopedic Tech Progress Note Patient Details:  Brandon [name] 1946-03-25 [id number]  Other Ortho Devices Type of Ortho Device: Shoulder abduction pillow Ortho Device Interventions: Application   Brandon [name] 11/26/2011, 3:27 PM

## 2011-11-26 NOTE — Brief Op Note (Signed)
11/26/2011  4:51 PM  PATIENT:  Brandon [name]  66 y.o. male  PRE-OPERATIVE DIAGNOSIS:  left rotator cuff tear  POST-OPERATIVE DIAGNOSIS:  left rotator cuff tear, partially repairable, SLAP lesion, AC DJD  PROCEDURE:  Procedure(s) (LRB): SHOULDER ARTHROSCOPY WITH OPEN ROTATOR CUFF REPAIR, SAD, BICEPS TENODESIS AND DISTAL CLAVICLE EXCISION, LEFT BICEPT TENODESIS (Left)  SURGEON:  Surgeon(s) and Role:    * Verlee Rossetti, MD - Primary  PHYSICIAN ASSISTANT:   ASSISTANTS: Thea Gist, PA-C   ANESTHESIA:   regional and general  EBL:  Total I/O In: 2000 [I.V.:2000] Out: -   BLOOD ADMINISTERED:none  DRAINS: none   LOCAL MEDICATIONS USED:  MARCAINE     SPECIMEN:  No Specimen  DISPOSITION OF SPECIMEN:  N/A  COUNTS:  YES  TOURNIQUET:  * No tourniquets in log *  DICTATION: .Other Dictation: Dictation Number [id number]  PLAN OF CARE: Discharge to home after PACU  PATIENT DISPOSITION:  PACU - hemodynamically stable.   Delay start of Pharmacological VTE agent (>24hrs) due to surgical blood loss or risk of bleeding: not applicable

## 2011-11-26 NOTE — Anesthesia Postprocedure Evaluation (Signed)
  Anesthesia Post-op Note  [name]  Procedure(s) Performed: Procedure(s) (LRB): SHOULDER ARTHROSCOPY WITH OPEN ROTATOR CUFF REPAIR AND DISTAL CLAVICLE ACROMINECTOMY (Left) BICEPT TENODESIS (Left)  Patient Location: PACU  Anesthesia Type: General and GA combined with regional for post-op pain  Level of Consciousness: awake, alert  and oriented  Airway and Oxygen Therapy: Patient Spontanous Breathing  Post-op Pain: none  Post-op Assessment: Post-op Vital signs reviewed and Patient's Cardiovascular Status Stable  Post-op Vital Signs: stable  Complications: No apparent anesthesia complications

## 2011-11-26 NOTE — Interval H&P Note (Signed)
History and Physical Interval Note:  11/26/2011 12:55 PM  Brandon [name]  has presented today for surgery, with the diagnosis of left rotator cuff tear  The various methods of treatment have been discussed with the patient and family. After consideration of risks, benefits and other options for treatment, the patient has consented to  Procedure(s) (LRB): SHOULDER ARTHROSCOPY WITH OPEN ROTATOR CUFF REPAIR AND DISTAL CLAVICLE ACROMINECTOMY (Left) BICEPT TENODESIS (Left) as a surgical intervention .  The patients' history has been reviewed, patient examined, no change in status, stable for surgery.  I have reviewed the patients' chart and labs.  Questions were answered to the patient's satisfaction.     Blossie Raffel,STEVEN R

## 2011-11-26 NOTE — Preoperative (Signed)
Beta Blockers   Reason not to administer Beta Blockers:Not Applicable 

## 2011-11-26 NOTE — Discharge Instructions (Signed)
Keep arm propped on pillow(away from side).  Use pillow sling at night and when out of the home.  Absolutely no lifting, pushing, or pulling with the left arm.  Ice to the left shoulder constantly.  Change bandage to bandaids on Sunday.  May get wound wet on Wednesday next week in shower.  Do exercises every hour to prevent stiffness: Pendulums, lap slides, internal and external rotation exercises.

## 2011-11-26 NOTE — Transfer of Care (Signed)
Immediate Anesthesia Transfer of Care Note  [name]  Procedure(s) Performed: Procedure(s) (LRB): SHOULDER ARTHROSCOPY WITH OPEN ROTATOR CUFF REPAIR AND DISTAL CLAVICLE ACROMINECTOMY (Left) BICEPT TENODESIS (Left)  Patient Location: PACU  Anesthesia Type: GA combined with regional for post-op pain  Level of Consciousness: awake and sedated  Airway & Oxygen Therapy: Patient connected to nasal cannula oxygen  Post-op Assessment: Report given to PACU RN and Post -op Vital signs reviewed and stable  Post vital signs: Reviewed and stable  Complications: No apparent anesthesia complications

## 2011-11-27 NOTE — Op Note (Signed)
NAME:  MENACHEM, URBANEK NO.:  [id number]  MEDICAL RECORD NO.:  [id number]  LOCATION:  [facility]                         FACILITY:  [facility]  PHYSICIAN:  Almedia Balls. Ranell Patrick, M.D. DATE OF BIRTH:  1946-06-19  DATE OF PROCEDURE:  11/26/2011 DATE OF DISCHARGE:  11/26/2011                              OPERATIVE REPORT   PREOPERATIVE DIAGNOSIS:  Left shoulder rotator cuff tear.  POSTOPERATIVE DIAGNOSES: 1. Left shoulder rotator cuff tear, partially repairable. 2. Left shoulder superior labrum anterior and posterior lesion with     biceps tear. 3. Left shoulder impingement. 4. Left shoulder acromioclavicular joint arthritis, symptomatic.  PROCEDURE PERFORMED:  Left shoulder arthroscopy with extensive intra- articular debridement of torn superior labrum anterior and posterior arthroscopic biceps tenotomy, arthroscopic subacromial decompression followed by mini open rotator cuff repair, partial (infraspinatus) and also biceps tenodesis in the groove and open distal clavicle resection.  ATTENDING SURGEON:  Almedia Balls. Ranell Patrick, MD  ASSISTANT:  Donnie Coffin. Dixon, PA-C  ANESTHESIA:  General anesthesia was used plus interscalene block.  ESTIMATED BLOOD LOSS:  Minimal.  FLUID REPLACEMENT:  1200 mL of crystalloid.  INSTRUMENT COUNT:  Correct.  COMPLICATIONS:  There were no complications.  Perioperative antibiotics were given.  INDICATIONS:  The patient is a 66 year old male with worsening left shoulder pain and function following an injury to his shoulder.  The patient had fallen and stated that he had profound functional loss after that fall.  Of note, the patient's wife reports an injury over a year ago, in which, he sought orthopedic care and wanted to having an injection for his shoulder and never really did great after that injury and has had problems intermittently with left shoulder, it was just better functionally, but still had some pain in it from that event a year ago.   He presents now with profound weakness and functional loss, and an MRI documented severe rotator cuff tear involving supraspinatus and infraspinatus with retraction to the glenoid.  The patient presents for attempted repair and debridement of his shoulder.  Informed consent obtained.  DESCRIPTION OF PROCEDURE:  After an adequate level of anesthesia was achieved, the patient was positioned in the modified beach-chair position.  Left shoulder was sterilely prepped and draped in usual manner.  Time-out called.  We then entered the shoulder using a standard portals including anterior, posterior, and lateral portals.  We identified pretty significant synovitis of his shoulder joint as well as torn superior labrum anterior-posterior with the biceps that was torn about 25% on the way through.  We performed a biceps tenotomy and labral debridement, this was essentially a pan-labral tear, which we debrided using basket forceps and motorized shaver.  Subscapularis rolled eyes look normal.  Articular cartilage had some wear and tear centrally and down low near the margins as grade 2 and grade 3 chondromalacia. Rotator cuff was torn.  There was a cuff of tendon still at the supraspinatus.  The rest was retracted back to the level of the glenoid and was shattered posteriorly.  In the interspinous region, the tear was dramatic and retracted as well.  The teres minor was intact.  Following inspection of the joint and debridement and release  of the biceps tendon, we placed the scope in subacromial space.  Thorough bursectomy and acromioplasty was performed creating a type 1 acromial shape, visualizing the rotator cuff tear, which was a large gaping hole.  At this point, having completely decompressed the rotator cuff outlet, we then concluded the arthroscopic portion of the surgery.  We then made a saber incision overlying the AC joint.  Dissection down through the subcutaneous tissues using the Bovie.   Deltotrapezial fascia was identified and divided in line with distal clavicle.  Subperiosteal dissection of the distal clavicle performed followed by excision of distal 3-4 mm of bone.  Next, we went and thoroughly irrigated the AC interval, applied bone wax to cut in the clavicle.  We then repaired the deltotrapezial fascia anatomically with 0 Vicryl suture followed by 2-0 Vicryl for subcutaneous closure, 4-0 Monocryl for skin.  We then approached the rotator cuff and biceps through a single mini open incision.  We started the anterolateral border of the acromion and extending distally about 4 cm.  Dissection was down through the subcutaneous tissues using the Bovie.  I identified the deltoid raphe between the anterior and lateral heads of the deltoid.  We developed that with a needle-tip Bovie.  I placed an Arthrex retractor between the anterior and lateral heads.  We identified the biceps groove.  We opened that up and delivered the tendon out of the wound.  We whipstitch that and anchored it, mid-tension with the elbow at 90 degrees to the floor of the bicipital groove using a Biomet juggernaut suture anchor and we had a low profile repair oversewed with 0 Vicryl suture to reinforce the repair.  Once that was done, we directed our attention towards the rotator cuff, this was a severe tear with retraction.  I was able to free up the infraspinatus tendon and get that mobilized and back into an anatomic position using suture through bone and a single 4.5 ConMed Linvatec corkscrew anchor with #2 Hi-Fi suture double loaded, and we were able to do some side-to-side posteriorly and affected a nice repair and reconstruction of that posterior aspect of the tendon. Unfortunately, the supraspinatus tendon was friable and basically completely gone and degenerated, and there was left a good triangular- shaped defect anteriorly where the supraspinatus footprint normally would be.  The  subscapularis was intact, rolled edge all the way up to the insertion on the lesser tuberosity in the biceps groove, but there simply was just not enough supraspinatus tendon to work with.  What I could see was retracted and fixed back around the glenoid and no amount of freeing and pulling with the cuff grasper and freeing with the Cobb elevator with coaxing that tendon forward, not to think that was likely predated this most recent injury.  We thoroughly irrigated the wound. We felt good about getting that infraspinatus back into position and had a nice anchor at that leading edge of supraspinatus to prevent peel back.  We will need to keep the patient abducted during the recovery process likely 8-10 weeks of abduction just to protect that shoulder. We then closed the deltoid to itself anatomically with 0 Vicryl suture side-to-side followed by 2-0 Vicryl subcutaneous closure and 4-0 Monocryl for skin and portals.  Steri-Strips were applied followed by sterile dressing.  The patient tolerated the surgery well.     Almedia Balls. Ranell Patrick, M.D.    SRN/MEDQ  D:  11/26/2011  T:  11/27/2011  Job:  [id number]

## 2011-11-29 ENCOUNTER — Encounter (HOSPITAL_COMMUNITY): Payer: Self-pay | Admitting: Orthopedic Surgery

## 2011-11-29 DIAGNOSIS — M75120 Complete rotator cuff tear or rupture of unspecified shoulder, not specified as traumatic: Secondary | ICD-10-CM | POA: Diagnosis not present

## 2011-11-29 DIAGNOSIS — M25519 Pain in unspecified shoulder: Secondary | ICD-10-CM | POA: Diagnosis not present

## 2011-11-30 DIAGNOSIS — M25519 Pain in unspecified shoulder: Secondary | ICD-10-CM | POA: Diagnosis not present

## 2011-11-30 DIAGNOSIS — M75120 Complete rotator cuff tear or rupture of unspecified shoulder, not specified as traumatic: Secondary | ICD-10-CM | POA: Diagnosis not present

## 2011-12-04 HISTORY — PX: OTHER SURGICAL HISTORY: SHX169

## 2011-12-07 DIAGNOSIS — M47817 Spondylosis without myelopathy or radiculopathy, lumbosacral region: Secondary | ICD-10-CM | POA: Diagnosis not present

## 2011-12-07 DIAGNOSIS — M75110 Incomplete rotator cuff tear or rupture of unspecified shoulder, not specified as traumatic: Secondary | ICD-10-CM | POA: Diagnosis not present

## 2011-12-08 DIAGNOSIS — M25519 Pain in unspecified shoulder: Secondary | ICD-10-CM | POA: Diagnosis not present

## 2011-12-08 DIAGNOSIS — M75120 Complete rotator cuff tear or rupture of unspecified shoulder, not specified as traumatic: Secondary | ICD-10-CM | POA: Diagnosis not present

## 2011-12-09 DIAGNOSIS — M25519 Pain in unspecified shoulder: Secondary | ICD-10-CM | POA: Diagnosis not present

## 2011-12-09 DIAGNOSIS — M75120 Complete rotator cuff tear or rupture of unspecified shoulder, not specified as traumatic: Secondary | ICD-10-CM | POA: Diagnosis not present

## 2011-12-14 DIAGNOSIS — M75120 Complete rotator cuff tear or rupture of unspecified shoulder, not specified as traumatic: Secondary | ICD-10-CM | POA: Diagnosis not present

## 2011-12-14 DIAGNOSIS — M25519 Pain in unspecified shoulder: Secondary | ICD-10-CM | POA: Diagnosis not present

## 2011-12-16 DIAGNOSIS — M75120 Complete rotator cuff tear or rupture of unspecified shoulder, not specified as traumatic: Secondary | ICD-10-CM | POA: Diagnosis not present

## 2011-12-16 DIAGNOSIS — M25519 Pain in unspecified shoulder: Secondary | ICD-10-CM | POA: Diagnosis not present

## 2011-12-21 DIAGNOSIS — M25519 Pain in unspecified shoulder: Secondary | ICD-10-CM | POA: Diagnosis not present

## 2011-12-21 DIAGNOSIS — M75120 Complete rotator cuff tear or rupture of unspecified shoulder, not specified as traumatic: Secondary | ICD-10-CM | POA: Diagnosis not present

## 2011-12-23 DIAGNOSIS — M25519 Pain in unspecified shoulder: Secondary | ICD-10-CM | POA: Diagnosis not present

## 2011-12-23 DIAGNOSIS — M75120 Complete rotator cuff tear or rupture of unspecified shoulder, not specified as traumatic: Secondary | ICD-10-CM | POA: Diagnosis not present

## 2011-12-28 DIAGNOSIS — M25519 Pain in unspecified shoulder: Secondary | ICD-10-CM | POA: Diagnosis not present

## 2011-12-28 DIAGNOSIS — M75120 Complete rotator cuff tear or rupture of unspecified shoulder, not specified as traumatic: Secondary | ICD-10-CM | POA: Diagnosis not present

## 2011-12-30 DIAGNOSIS — M25519 Pain in unspecified shoulder: Secondary | ICD-10-CM | POA: Diagnosis not present

## 2011-12-30 DIAGNOSIS — M75120 Complete rotator cuff tear or rupture of unspecified shoulder, not specified as traumatic: Secondary | ICD-10-CM | POA: Diagnosis not present

## 2012-01-04 DIAGNOSIS — M25519 Pain in unspecified shoulder: Secondary | ICD-10-CM | POA: Diagnosis not present

## 2012-01-04 DIAGNOSIS — M75120 Complete rotator cuff tear or rupture of unspecified shoulder, not specified as traumatic: Secondary | ICD-10-CM | POA: Diagnosis not present

## 2012-01-12 DIAGNOSIS — M25519 Pain in unspecified shoulder: Secondary | ICD-10-CM | POA: Diagnosis not present

## 2012-01-12 DIAGNOSIS — M75120 Complete rotator cuff tear or rupture of unspecified shoulder, not specified as traumatic: Secondary | ICD-10-CM | POA: Diagnosis not present

## 2012-01-19 DIAGNOSIS — M75120 Complete rotator cuff tear or rupture of unspecified shoulder, not specified as traumatic: Secondary | ICD-10-CM | POA: Diagnosis not present

## 2012-01-19 DIAGNOSIS — M25519 Pain in unspecified shoulder: Secondary | ICD-10-CM | POA: Diagnosis not present

## 2012-01-25 DIAGNOSIS — M75120 Complete rotator cuff tear or rupture of unspecified shoulder, not specified as traumatic: Secondary | ICD-10-CM | POA: Diagnosis not present

## 2012-01-25 DIAGNOSIS — M25519 Pain in unspecified shoulder: Secondary | ICD-10-CM | POA: Diagnosis not present

## 2012-02-08 DIAGNOSIS — M25529 Pain in unspecified elbow: Secondary | ICD-10-CM | POA: Diagnosis not present

## 2012-02-11 DIAGNOSIS — H26499 Other secondary cataract, unspecified eye: Secondary | ICD-10-CM | POA: Diagnosis not present

## 2012-02-11 DIAGNOSIS — Z961 Presence of intraocular lens: Secondary | ICD-10-CM | POA: Diagnosis not present

## 2012-02-11 DIAGNOSIS — H2510 Age-related nuclear cataract, unspecified eye: Secondary | ICD-10-CM | POA: Diagnosis not present

## 2012-03-15 DIAGNOSIS — M47817 Spondylosis without myelopathy or radiculopathy, lumbosacral region: Secondary | ICD-10-CM | POA: Diagnosis not present

## 2012-04-04 DIAGNOSIS — M25519 Pain in unspecified shoulder: Secondary | ICD-10-CM | POA: Diagnosis not present

## 2012-07-05 DIAGNOSIS — M47817 Spondylosis without myelopathy or radiculopathy, lumbosacral region: Secondary | ICD-10-CM | POA: Diagnosis not present

## 2012-07-25 DIAGNOSIS — Z23 Encounter for immunization: Secondary | ICD-10-CM | POA: Diagnosis not present

## 2012-08-14 DIAGNOSIS — Z125 Encounter for screening for malignant neoplasm of prostate: Secondary | ICD-10-CM | POA: Diagnosis not present

## 2012-08-14 DIAGNOSIS — R7989 Other specified abnormal findings of blood chemistry: Secondary | ICD-10-CM | POA: Diagnosis not present

## 2012-08-14 DIAGNOSIS — E785 Hyperlipidemia, unspecified: Secondary | ICD-10-CM | POA: Diagnosis not present

## 2012-08-14 DIAGNOSIS — I10 Essential (primary) hypertension: Secondary | ICD-10-CM | POA: Diagnosis not present

## 2012-08-25 DIAGNOSIS — Z125 Encounter for screening for malignant neoplasm of prostate: Secondary | ICD-10-CM | POA: Diagnosis not present

## 2012-08-25 DIAGNOSIS — Z0000 Encounter for general adult medical examination without abnormal findings: Secondary | ICD-10-CM | POA: Diagnosis not present

## 2012-08-25 DIAGNOSIS — Z23 Encounter for immunization: Secondary | ICD-10-CM | POA: Diagnosis not present

## 2012-10-05 DIAGNOSIS — M47817 Spondylosis without myelopathy or radiculopathy, lumbosacral region: Secondary | ICD-10-CM | POA: Diagnosis not present

## 2013-01-11 DIAGNOSIS — M47817 Spondylosis without myelopathy or radiculopathy, lumbosacral region: Secondary | ICD-10-CM | POA: Diagnosis not present

## 2013-02-09 DIAGNOSIS — E785 Hyperlipidemia, unspecified: Secondary | ICD-10-CM | POA: Diagnosis not present

## 2013-02-09 DIAGNOSIS — R7989 Other specified abnormal findings of blood chemistry: Secondary | ICD-10-CM | POA: Diagnosis not present

## 2013-02-09 DIAGNOSIS — I10 Essential (primary) hypertension: Secondary | ICD-10-CM | POA: Diagnosis not present

## 2013-02-13 DIAGNOSIS — Z961 Presence of intraocular lens: Secondary | ICD-10-CM | POA: Diagnosis not present

## 2013-02-13 DIAGNOSIS — H2510 Age-related nuclear cataract, unspecified eye: Secondary | ICD-10-CM | POA: Diagnosis not present

## 2013-02-13 DIAGNOSIS — H26499 Other secondary cataract, unspecified eye: Secondary | ICD-10-CM | POA: Diagnosis not present

## 2013-02-22 DIAGNOSIS — I10 Essential (primary) hypertension: Secondary | ICD-10-CM | POA: Diagnosis not present

## 2013-02-22 DIAGNOSIS — E785 Hyperlipidemia, unspecified: Secondary | ICD-10-CM | POA: Diagnosis not present

## 2013-02-22 DIAGNOSIS — M1990 Unspecified osteoarthritis, unspecified site: Secondary | ICD-10-CM | POA: Diagnosis not present

## 2013-02-22 DIAGNOSIS — R7989 Other specified abnormal findings of blood chemistry: Secondary | ICD-10-CM | POA: Diagnosis not present

## 2013-04-20 DIAGNOSIS — H9319 Tinnitus, unspecified ear: Secondary | ICD-10-CM | POA: Diagnosis not present

## 2013-04-20 DIAGNOSIS — H903 Sensorineural hearing loss, bilateral: Secondary | ICD-10-CM | POA: Diagnosis not present

## 2013-05-24 DIAGNOSIS — D239 Other benign neoplasm of skin, unspecified: Secondary | ICD-10-CM | POA: Diagnosis not present

## 2013-05-24 DIAGNOSIS — L570 Actinic keratosis: Secondary | ICD-10-CM | POA: Diagnosis not present

## 2013-05-24 DIAGNOSIS — B356 Tinea cruris: Secondary | ICD-10-CM | POA: Diagnosis not present

## 2013-05-24 DIAGNOSIS — L821 Other seborrheic keratosis: Secondary | ICD-10-CM | POA: Diagnosis not present

## 2013-05-30 DIAGNOSIS — M1710 Unilateral primary osteoarthritis, unspecified knee: Secondary | ICD-10-CM | POA: Diagnosis not present

## 2013-06-21 DIAGNOSIS — L570 Actinic keratosis: Secondary | ICD-10-CM | POA: Diagnosis not present

## 2013-07-05 DIAGNOSIS — L570 Actinic keratosis: Secondary | ICD-10-CM | POA: Diagnosis not present

## 2013-07-19 DIAGNOSIS — M47817 Spondylosis without myelopathy or radiculopathy, lumbosacral region: Secondary | ICD-10-CM | POA: Diagnosis not present

## 2013-07-26 DIAGNOSIS — M1710 Unilateral primary osteoarthritis, unspecified knee: Secondary | ICD-10-CM | POA: Diagnosis not present

## 2013-07-27 DIAGNOSIS — Z23 Encounter for immunization: Secondary | ICD-10-CM | POA: Diagnosis not present

## 2013-08-13 DIAGNOSIS — L570 Actinic keratosis: Secondary | ICD-10-CM | POA: Diagnosis not present

## 2013-08-16 HISTORY — PX: OTHER SURGICAL HISTORY: SHX169

## 2013-08-20 DIAGNOSIS — R0789 Other chest pain: Secondary | ICD-10-CM | POA: Diagnosis not present

## 2013-08-20 DIAGNOSIS — R21 Rash and other nonspecific skin eruption: Secondary | ICD-10-CM | POA: Diagnosis not present

## 2013-08-20 DIAGNOSIS — I10 Essential (primary) hypertension: Secondary | ICD-10-CM | POA: Diagnosis not present

## 2013-08-20 DIAGNOSIS — E785 Hyperlipidemia, unspecified: Secondary | ICD-10-CM | POA: Diagnosis not present

## 2013-08-24 DIAGNOSIS — R0789 Other chest pain: Secondary | ICD-10-CM | POA: Diagnosis not present

## 2013-08-24 DIAGNOSIS — R21 Rash and other nonspecific skin eruption: Secondary | ICD-10-CM | POA: Diagnosis not present

## 2013-08-27 DIAGNOSIS — L259 Unspecified contact dermatitis, unspecified cause: Secondary | ICD-10-CM | POA: Diagnosis not present

## 2013-08-27 DIAGNOSIS — L570 Actinic keratosis: Secondary | ICD-10-CM | POA: Diagnosis not present

## 2013-09-10 DIAGNOSIS — Z0000 Encounter for general adult medical examination without abnormal findings: Secondary | ICD-10-CM | POA: Diagnosis not present

## 2013-09-17 DIAGNOSIS — E782 Mixed hyperlipidemia: Secondary | ICD-10-CM | POA: Insufficient documentation

## 2013-09-17 DIAGNOSIS — Z8739 Personal history of other diseases of the musculoskeletal system and connective tissue: Secondary | ICD-10-CM | POA: Insufficient documentation

## 2013-09-18 ENCOUNTER — Encounter (INDEPENDENT_AMBULATORY_CARE_PROVIDER_SITE_OTHER): Payer: Self-pay

## 2013-09-18 ENCOUNTER — Encounter: Payer: Self-pay | Admitting: Cardiology

## 2013-09-18 ENCOUNTER — Ambulatory Visit (INDEPENDENT_AMBULATORY_CARE_PROVIDER_SITE_OTHER): Payer: Medicare Other | Admitting: Cardiology

## 2013-09-18 VITALS — BP 160/90 | HR 58 | Ht 73.0 in | Wt 206.0 lb

## 2013-09-18 DIAGNOSIS — Z8249 Family history of ischemic heart disease and other diseases of the circulatory system: Secondary | ICD-10-CM | POA: Diagnosis not present

## 2013-09-18 DIAGNOSIS — I208 Other forms of angina pectoris: Secondary | ICD-10-CM

## 2013-09-18 DIAGNOSIS — I209 Angina pectoris, unspecified: Secondary | ICD-10-CM | POA: Diagnosis not present

## 2013-09-18 DIAGNOSIS — I10 Essential (primary) hypertension: Secondary | ICD-10-CM | POA: Diagnosis not present

## 2013-09-18 NOTE — Progress Notes (Signed)
Clinical Summary  [name] is a 68 y.o.male referred for cardiology consultation by Dr. Luan Pulling. He is here with his wife today. He reports at least a six-month history of exertional indigestion and chest tightness. He notices this when he walks quickly and goes up hills. Wife states that he sometimes has to stop due to his symptoms. He also has reflux symptoms at baseline that are similar, although he does not have the tightness.  Recent lab work from January 2015 showed hemoglobin 15.6, platelets 226, potassium 4.2, BUN 18, creatinine 0.7, normal LFTs, cholesterol 175, triglycerides 143, HDL 68, LDL 78. He tolerates Pravachol.  Remote myocardial perfusion study from 2002 reported normal perfusion with no ischemia, LVEF 58%. He has had no followup ischemic testing since that time.  Recent ECG reviewed showing normal sinus rhythm.  Patient's mother has a history of hypertrophic obstructive cardiomyopathy. He and his siblings had echocardiograms previously that did not demonstrate cardiomyopathy. This has been several years ago.  No Known Allergies  Current Outpatient Prescriptions  Medication Sig Dispense Refill  . acetaminophen (TYLENOL) 500 MG tablet Take 1,000 mg by mouth every 6 (six) hours as needed. For pain      . allopurinol (ZYLOPRIM) 300 MG tablet Take 300 mg by mouth daily.      Marland Kitchen aspirin EC 81 MG tablet Take 81 mg by mouth daily.      . Glucosamine-Chondroitin (OSTEO BI-FLEX REGULAR STRENGTH PO) Take 1 tablet by mouth daily.      . hydrochlorothiazide (HYDRODIURIL) 50 MG tablet Take 50 mg by mouth daily.      . meloxicam (MOBIC) 7.5 MG tablet Take 7.5 mg by mouth at bedtime.      . metoprolol (LOPRESSOR) 50 MG tablet Take 25 mg by mouth daily.      . Multiple Vitamin (MULITIVITAMIN WITH MINERALS) TABS Take 1 tablet by mouth daily.      . Omega-3 Fatty Acids (FISH OIL) 1200 MG CAPS Take 1 capsule by mouth daily.      Marland Kitchen omeprazole (PRILOSEC) 20 MG capsule Take 20 mg by mouth  daily.      . potassium chloride (K-DUR,KLOR-CON) 10 MEQ tablet Take 10 mEq by mouth daily.      . pravastatin (PRAVACHOL) 40 MG tablet Take 40 mg by mouth daily.       No current facility-administered medications for this visit.    Past Medical History  Diagnosis Date  . Essential hypertension, benign   . Nephrolithiasis   . GERD (gastroesophageal reflux disease)   . Arthritis   . DDD (degenerative disc disease), lumbar     Dr. Carloyn Manner  . Mixed hyperlipidemia   . Lumbago     Past Surgical History  Procedure Laterality Date  . Left knee replacement    . Bicept tenodesis  11/26/2011    Procedure: BICEPT TENODESIS;  Surgeon: Augustin Schooling, MD;  Location: [facility];  Service: Orthopedics;  Laterality: Left;    Family History  Problem Relation Age of Onset  . Lung cancer Father   . Heart failure Mother   . Hypertension Sister     Social History Mr. Powley reports that he quit smoking about 29 years ago. His smoking use included Cigarettes. He has a 60 pack-year smoking history. He has quit using smokeless tobacco. His smokeless tobacco use included Chew. Mr. Orrego reports that he drinks alcohol.  Review of Systems No palpitations, dizziness, syncope. No orthopnea or PND. No obvious claudication. Otherwise as  outlined above.  Physical Examination Filed Vitals:   09/18/13 0857  BP: 160/90  Pulse: 58   Filed Weights   09/18/13 0857  Weight: 206 lb (93.441 kg)   Patient appears comfortable at rest. HEENT: Conjunctiva and lids normal, oropharynx clear. Neck: Supple, no elevated JVP or carotid bruits, no thyromegaly. Lungs: Clear to auscultation, nonlabored breathing at rest. Cardiac: Regular rate and rhythm, no S3 or significant systolic murmur, no pericardial rub. Abdomen: Soft, nontender, bowel sounds present. Extremities: No pitting edema, distal pulses 2+. Skin: Warm and dry. Musculoskeletal: No kyphosis. Neuropsychiatric: Alert and oriented x3, affect grossly  appropriate.   Problem List and Plan   Exertional angina Symptoms consistent with exertional angina, although he does have reflux symptoms that are similar. Personal cardiac risk factors include age and gender, hypertension, prior documented borderline type 2 diabetes mellitus. Baseline ECG is normal. We discussed options for further objective ischemic testing including noninvasive and invasive techniques. At this point he is comfortable pursuing a stress test, we will proceed with an exercise Cardiolite. Lopressor will be held for the test. We will bring him back to the office for followup.  Family history of hypertrophic cardiomyopathy History in the patient's mother. He reports having a remote echocardiogram that was reportedly normal. ECG shows borderline increased voltage. No significant murmur on examination. To be clear, echocardiogram will be obtained.  Essential hypertension, benign Long-standing history, managed by Dr. Luan Pulling.  Mixed hyperlipidemia Recent lipid panel showed good LDL control on Pravachol.    Satira Sark, M.D., F.A.C.C.

## 2013-09-18 NOTE — Patient Instructions (Addendum)
Your physician wants you to follow-up in:  3 weeks  Your physician has requested that you have an echocardiogram. Echocardiography is a painless test that uses sound waves to create images of your heart. It provides your doctor with information about the size and shape of your heart and how well your heart's chambers and valves are working. This procedure takes approximately one hour. There are no restrictions for this procedure.  Your physician has requested that you have an exercise tolerance test. For further information please visit [email or web address]. Please also follow instruction sheet, as given. Please HOLD LOPRESSOR day of tes    Thanks for choosing [facility] !

## 2013-09-18 NOTE — Assessment & Plan Note (Signed)
History in the patient's mother. He reports having a remote echocardiogram that was reportedly normal. ECG shows borderline increased voltage. No significant murmur on examination. To be clear, echocardiogram will be obtained.

## 2013-09-18 NOTE — Assessment & Plan Note (Signed)
Recent lipid panel showed good LDL control on Pravachol.

## 2013-09-18 NOTE — Assessment & Plan Note (Signed)
Long-standing history, managed by Dr. Luan Pulling.

## 2013-09-18 NOTE — Assessment & Plan Note (Signed)
Symptoms consistent with exertional angina, although he does have reflux symptoms that are similar. Personal cardiac risk factors include age and gender, hypertension, prior documented borderline type 2 diabetes mellitus. Baseline ECG is normal. We discussed options for further objective ischemic testing including noninvasive and invasive techniques. At this point he is comfortable pursuing a stress test, we will proceed with an exercise Cardiolite. Lopressor will be held for the test. We will bring him back to the office for followup.

## 2013-09-19 ENCOUNTER — Ambulatory Visit (HOSPITAL_COMMUNITY)
Admission: RE | Admit: 2013-09-19 | Discharge: 2013-09-19 | Disposition: A | Discharge status: Home / Self Care | Payer: Medicare Other | Source: Ambulatory Visit | Attending: Cardiology | Admitting: Cardiology

## 2013-09-19 DIAGNOSIS — E785 Hyperlipidemia, unspecified: Secondary | ICD-10-CM | POA: Insufficient documentation

## 2013-09-19 DIAGNOSIS — I209 Angina pectoris, unspecified: Secondary | ICD-10-CM | POA: Insufficient documentation

## 2013-09-19 DIAGNOSIS — I517 Cardiomegaly: Secondary | ICD-10-CM | POA: Insufficient documentation

## 2013-09-19 DIAGNOSIS — Z8489 Family history of other specified conditions: Secondary | ICD-10-CM | POA: Diagnosis not present

## 2013-09-19 DIAGNOSIS — Z8249 Family history of ischemic heart disease and other diseases of the circulatory system: Secondary | ICD-10-CM

## 2013-09-19 DIAGNOSIS — I10 Essential (primary) hypertension: Secondary | ICD-10-CM | POA: Insufficient documentation

## 2013-09-19 DIAGNOSIS — I208 Other forms of angina pectoris: Secondary | ICD-10-CM

## 2013-09-19 NOTE — Progress Notes (Signed)
*  PRELIMINARY RESULTS* Echocardiogram 2D Echocardiogram has been performed.  [place], [place] 09/19/2013, 2:35 PM

## 2013-09-20 ENCOUNTER — Telehealth: Payer: Self-pay

## 2013-09-20 NOTE — Telephone Encounter (Signed)
Results advised to pt

## 2013-09-24 ENCOUNTER — Ambulatory Visit (HOSPITAL_COMMUNITY): Admission: RE | Admit: 2013-09-24 | Payer: Medicare Other | Source: Ambulatory Visit

## 2013-09-24 NOTE — Addendum Note (Signed)
Addended by: Truett Main[place] on: 09/24/2013 08:47 AM   Modules accepted: Orders

## 2013-10-01 ENCOUNTER — Encounter (HOSPITAL_COMMUNITY)
Admission: RE | Admit: 2013-10-01 | Discharge: 2013-10-01 | Disposition: A | Discharge status: Home / Self Care | Payer: Medicare Other | Source: Ambulatory Visit | Attending: Cardiology | Admitting: Cardiology

## 2013-10-01 ENCOUNTER — Encounter (HOSPITAL_COMMUNITY): Payer: Self-pay

## 2013-10-01 DIAGNOSIS — I208 Other forms of angina pectoris: Secondary | ICD-10-CM

## 2013-10-01 DIAGNOSIS — I209 Angina pectoris, unspecified: Secondary | ICD-10-CM | POA: Insufficient documentation

## 2013-10-01 DIAGNOSIS — R079 Chest pain, unspecified: Secondary | ICD-10-CM | POA: Diagnosis not present

## 2013-10-01 MED ORDER — NITROGLYCERIN 0.4 MG SL SUBL
SUBLINGUAL_TABLET | SUBLINGUAL | Status: AC
  Administered 2013-10-01: 0.4 mg via SUBLINGUAL
  Filled 2013-10-01: qty 25

## 2013-10-01 MED ORDER — TECHNETIUM TC 99M SESTAMIBI - CARDIOLITE
10.0000 | Freq: Once | INTRAVENOUS | Status: AC | PRN
  Administered 2013-10-01: 10 via INTRAVENOUS

## 2013-10-01 MED ORDER — SODIUM CHLORIDE 0.9 % IJ SOLN
INTRAMUSCULAR | Status: AC
  Administered 2013-10-01: 10 mL via INTRAVENOUS
  Filled 2013-10-01: qty 10

## 2013-10-01 MED ORDER — REGADENOSON 0.4 MG/5ML IV SOLN
INTRAVENOUS | Status: AC
  Administered 2013-10-01: 0.4 mg via INTRAVENOUS
  Filled 2013-10-01: qty 5

## 2013-10-01 MED ORDER — TECHNETIUM TC 99M SESTAMIBI GENERIC - CARDIOLITE
30.0000 | Freq: Once | INTRAVENOUS | Status: AC | PRN
  Administered 2013-10-01: 31 via INTRAVENOUS

## 2013-10-01 NOTE — Progress Notes (Signed)
Stress Lab Nurses Notes - Brandon [name]  Brandon [name] 10/01/2013 Reason for doing test: Exertional angina Type of test: Test Changed due to chest tightness. [name] given Nurse performing test: Gerrit Halls, RN Nuclear Medicine Tech: Melburn Hake Echo Tech: Not Applicable MD performing test: Branch/K.Lawrence NP Family MD: Luan Pulling Test explained and consent signed: yes IV started: 22g jelco, Saline lock flushed, No redness or edema and Saline lock started in radiology Symptoms: Chest tightness # 7 on pain scale. NTG given SL Treatment/Intervention: None Reason test stopped: protocol completed After recovery IV was: Discontinued via X-ray tech and No redness or edema Patient to return to [facility]. Med at : 10:25 Patient discharged: Home Patient's Condition upon discharge was: stable Comments: During test BP 213/103 & HR 121.  Recovery BP 139/96 & HR 81.  Symptoms resolved in recovery. Chest tightness relieved. Geanie Cooley T

## 2013-10-03 ENCOUNTER — Telehealth: Payer: Self-pay

## 2013-10-03 NOTE — Telephone Encounter (Signed)
Message copied by Bernita Raisin on Wed Oct 03, 2013 12:48 PM ------      Message from: Satira Sark      Created: Tue Oct 02, 2013  5:07 PM       Reviewed report. Please let him know that this study is consistent with symptomatic ischemic heart disease (CAD). Would recommend cardiac catheterization as a next step, and we should get this scheduled soon. I can see him this week in [place], or he can see Curt Bears this week to get it set up. ------

## 2013-10-03 NOTE — Telephone Encounter (Signed)
Pt will see Dr.McDowell this week to discuss results of recent lexiscan

## 2013-10-04 ENCOUNTER — Ambulatory Visit (INDEPENDENT_AMBULATORY_CARE_PROVIDER_SITE_OTHER): Payer: Medicare Other | Admitting: Cardiology

## 2013-10-04 ENCOUNTER — Encounter: Payer: Self-pay | Admitting: Cardiology

## 2013-10-04 ENCOUNTER — Encounter: Payer: Self-pay | Admitting: *Deleted

## 2013-10-04 ENCOUNTER — Telehealth: Payer: Self-pay | Admitting: Cardiology

## 2013-10-04 VITALS — BP 139/89 | HR 54 | Ht 73.0 in | Wt 208.0 lb

## 2013-10-04 DIAGNOSIS — E782 Mixed hyperlipidemia: Secondary | ICD-10-CM | POA: Diagnosis not present

## 2013-10-04 DIAGNOSIS — I209 Angina pectoris, unspecified: Secondary | ICD-10-CM

## 2013-10-04 DIAGNOSIS — Z8249 Family history of ischemic heart disease and other diseases of the circulatory system: Secondary | ICD-10-CM

## 2013-10-04 DIAGNOSIS — R9430 Abnormal result of cardiovascular function study, unspecified: Secondary | ICD-10-CM | POA: Insufficient documentation

## 2013-10-04 DIAGNOSIS — I10 Essential (primary) hypertension: Secondary | ICD-10-CM | POA: Diagnosis not present

## 2013-10-04 NOTE — Assessment & Plan Note (Signed)
Echocardiogram does show severe LVH but preserved LVEF and no outflow obstruction. Will focus on volume and blood pressure control.

## 2013-10-04 NOTE — Assessment & Plan Note (Signed)
Continues on Pravachol. 

## 2013-10-04 NOTE — Patient Instructions (Signed)
Your physician recommends that you continue on your current medications as directed. Please refer to the Current Medication list given to you today. Your physician has requested that you have a cardiac catheterization. Cardiac catheterization is used to diagnose and/or treat various heart conditions. Doctors may recommend this procedure for a number of different reasons. The most common reason is to evaluate chest pain. Chest pain can be a symptom of coronary artery disease (CAD), and cardiac catheterization can show whether plaque is narrowing or blocking your heart's arteries. This procedure is also used to evaluate the valves, as well as measure the blood flow and oxygen levels in different parts of your heart. For further information please visit www.cardiosmart.org. Please follow instruction sheet, as given.   

## 2013-10-04 NOTE — Assessment & Plan Note (Signed)
Recent exercise Cardiolite showed abnormal ST segment changes in the presence of angina, apical and inferoseptal ischemia. Plan is to proceed with a diagnostic cardiac catheterization to define coronary anatomy and determine if any exacerbation options are necessary. Procedure is being scheduled with Dr. Angelena Form for early next week.

## 2013-10-04 NOTE — Assessment & Plan Note (Signed)
No change to current regimen. 

## 2013-10-04 NOTE — Progress Notes (Signed)
Clinical Summary [name] is a 68 y.o.male seen recently in consultation for symptoms of exertional angina in the setting of hypertension and borderline type 2 diabetes mellitus. He also had family history of hypertrophic cardiomyopathy (mother). Followup cardiac testing was undertaken for further evaluation.  Echocardiogram in February 2015 showed severe LVH with LVEF 20-25%, grade 1 diastolic dysfunction, no evidence of SAM or obvious outflow obstruction, mild left atrial enlargement, no major valvular abnormalities. Exercise Cardiolite also performed in February 2015 showed a large area of ischemia in the apical and inferoseptal myocardium, LVEF 57%, ST segment abnormalities by ECG.  He is here with his wife today, we discussed the results of his testing. We reviewed risks and benefits of proceeding to a diagnostic cardiac catheterization to better understand his coronary anatomy and determine if any revascularization options need to be considered. He is in agreement to proceed.   No Known Allergies  Current Outpatient Prescriptions  Medication Sig Dispense Refill  . acetaminophen (TYLENOL) 500 MG tablet Take 1,000 mg by mouth every 6 (six) hours as needed. For pain      . allopurinol (ZYLOPRIM) 300 MG tablet Take 300 mg by mouth daily.      Marland Kitchen aspirin EC 81 MG tablet Take 81 mg by mouth daily.      . Glucosamine-Chondroitin (OSTEO BI-FLEX REGULAR STRENGTH PO) Take 1 tablet by mouth daily.      . hydrochlorothiazide (HYDRODIURIL) 50 MG tablet Take 50 mg by mouth daily.      . meloxicam (MOBIC) 7.5 MG tablet Take 7.5 mg by mouth at bedtime.      . metoprolol (LOPRESSOR) 50 MG tablet Take 25 mg by mouth daily.      . Multiple Vitamin (MULITIVITAMIN WITH MINERALS) TABS Take 1 tablet by mouth daily.      . Omega-3 Fatty Acids (FISH OIL) 1200 MG CAPS Take 1 capsule by mouth daily.      Marland Kitchen omeprazole (PRILOSEC) 20 MG capsule Take 20 mg by mouth daily.      . potassium chloride  (K-DUR,KLOR-CON) 10 MEQ tablet Take 10 mEq by mouth daily.      . pravastatin (PRAVACHOL) 40 MG tablet Take 40 mg by mouth daily.       No current facility-administered medications for this visit.    Past Medical History  Diagnosis Date  . Essential hypertension, benign   . Nephrolithiasis   . GERD (gastroesophageal reflux disease)   . Arthritis   . DDD (degenerative disc disease), lumbar     Dr. Carloyn Manner  . Mixed hyperlipidemia   . Lumbago     Past Surgical History  Procedure Laterality Date  . Left knee replacement    . Bicept tenodesis  11/26/2011    Procedure: BICEPT TENODESIS;  Surgeon: Augustin Schooling, MD;  Location: [facility];  Service: Orthopedics;  Laterality: Left;    Family History  Problem Relation Age of Onset  . Lung cancer Father   . Heart failure Mother   . Hypertension Sister     Social History Brandon [name] reports that he quit smoking about 29 years ago. His smoking use included Cigarettes. He has a 60 pack-year smoking history. He has quit using smokeless tobacco. His smokeless tobacco use included Chew. Brandon [name] reports that he drinks alcohol.  Review of Systems No palpitations, dizziness, syncope. No orthopnea or PND. No obvious claudication.  Physical Examination Filed Vitals:   10/04/13 1321  BP: 139/89  Pulse: 54   Filed  Weights   10/04/13 1321  Weight: 208 lb (94.348 kg)   Patient appears comfortable at rest.  HEENT: Conjunctiva and lids normal, oropharynx clear.  Neck: Supple, no elevated JVP or carotid bruits, no thyromegaly.  Lungs: Clear to auscultation, nonlabored breathing at rest.  Cardiac: Regular rate and rhythm, no S3 or significant systolic murmur, no pericardial rub.  Abdomen: Soft, nontender, bowel sounds present.  Extremities: No pitting edema, distal pulses 2+.  Skin: Warm and dry.  Musculoskeletal: No kyphosis.  Neuropsychiatric: Alert and oriented x3, affect grossly appropriate.   Problem List and Plan   Abnormal  cardiovascular function study Recent exercise Cardiolite showed abnormal ST segment changes in the presence of angina, apical and inferoseptal ischemia. Plan is to proceed with a diagnostic cardiac catheterization to define coronary anatomy and determine if any exacerbation options are necessary. Procedure is being scheduled with Dr. Angelena Form for early next week.  Essential hypertension, benign No change to current regimen.  Family history of hypertrophic cardiomyopathy Echocardiogram does show severe LVH but preserved LVEF and no outflow obstruction. Will focus on volume and blood pressure control.  Mixed hyperlipidemia Continues on Pravachol.    Satira Sark, M.D., F.A.C.C.

## 2013-10-04 NOTE — Telephone Encounter (Signed)
Left heart cath possible PCI 10/09/13 @10 :30 am with Dr. Angelena Form BC:[id number] cardiolite CHECKING PERCERT

## 2013-10-05 ENCOUNTER — Encounter (HOSPITAL_COMMUNITY): Payer: Self-pay | Admitting: Pharmacy Technician

## 2013-10-05 DIAGNOSIS — L570 Actinic keratosis: Secondary | ICD-10-CM | POA: Diagnosis not present

## 2013-10-05 DIAGNOSIS — L259 Unspecified contact dermatitis, unspecified cause: Secondary | ICD-10-CM | POA: Diagnosis not present

## 2013-10-05 DIAGNOSIS — D485 Neoplasm of uncertain behavior of skin: Secondary | ICD-10-CM | POA: Diagnosis not present

## 2013-10-05 DIAGNOSIS — L219 Seborrheic dermatitis, unspecified: Secondary | ICD-10-CM | POA: Diagnosis not present

## 2013-10-06 NOTE — Telephone Encounter (Signed)
Pt has Medicare and [employer] MCR supplement.  No precert required.

## 2013-10-08 DIAGNOSIS — C4441 Basal cell carcinoma of skin of scalp and neck: Secondary | ICD-10-CM | POA: Diagnosis not present

## 2013-10-08 DIAGNOSIS — D2360 Other benign neoplasm of skin of unspecified upper limb, including shoulder: Secondary | ICD-10-CM | POA: Diagnosis not present

## 2013-10-09 ENCOUNTER — Ambulatory Visit (HOSPITAL_COMMUNITY): Payer: Medicare Other

## 2013-10-09 ENCOUNTER — Ambulatory Visit: Payer: Medicare Other | Admitting: Cardiology

## 2013-10-09 ENCOUNTER — Other Ambulatory Visit: Payer: Self-pay | Admitting: *Deleted

## 2013-10-09 ENCOUNTER — Ambulatory Visit (HOSPITAL_COMMUNITY)
Admission: RE | Admit: 2013-10-09 | Discharge: 2013-10-10 | Disposition: A | Discharge status: Home / Self Care | Payer: Medicare Other | Source: Ambulatory Visit | Attending: Cardiovascular Disease | Admitting: Cardiovascular Disease

## 2013-10-09 ENCOUNTER — Encounter (HOSPITAL_COMMUNITY): Payer: Medicare Other

## 2013-10-09 ENCOUNTER — Encounter (HOSPITAL_COMMUNITY): Payer: Self-pay

## 2013-10-09 ENCOUNTER — Encounter (HOSPITAL_COMMUNITY)
Admission: RE | Disposition: A | Discharge status: Home / Self Care | Payer: Self-pay | Source: Ambulatory Visit | Attending: Cardiovascular Disease

## 2013-10-09 DIAGNOSIS — I2510 Atherosclerotic heart disease of native coronary artery without angina pectoris: Secondary | ICD-10-CM

## 2013-10-09 DIAGNOSIS — IMO0002 Reserved for concepts with insufficient information to code with codable children: Secondary | ICD-10-CM | POA: Insufficient documentation

## 2013-10-09 DIAGNOSIS — I10 Essential (primary) hypertension: Secondary | ICD-10-CM | POA: Diagnosis not present

## 2013-10-09 DIAGNOSIS — Z87891 Personal history of nicotine dependence: Secondary | ICD-10-CM | POA: Diagnosis not present

## 2013-10-09 DIAGNOSIS — Z96659 Presence of unspecified artificial knee joint: Secondary | ICD-10-CM | POA: Insufficient documentation

## 2013-10-09 DIAGNOSIS — E782 Mixed hyperlipidemia: Secondary | ICD-10-CM | POA: Diagnosis present

## 2013-10-09 DIAGNOSIS — I200 Unstable angina: Secondary | ICD-10-CM | POA: Diagnosis not present

## 2013-10-09 DIAGNOSIS — Z87442 Personal history of urinary calculi: Secondary | ICD-10-CM | POA: Diagnosis not present

## 2013-10-09 DIAGNOSIS — K219 Gastro-esophageal reflux disease without esophagitis: Secondary | ICD-10-CM | POA: Diagnosis not present

## 2013-10-09 DIAGNOSIS — Z79899 Other long term (current) drug therapy: Secondary | ICD-10-CM | POA: Insufficient documentation

## 2013-10-09 DIAGNOSIS — Z7982 Long term (current) use of aspirin: Secondary | ICD-10-CM | POA: Diagnosis not present

## 2013-10-09 DIAGNOSIS — E119 Type 2 diabetes mellitus without complications: Secondary | ICD-10-CM | POA: Diagnosis not present

## 2013-10-09 DIAGNOSIS — R9430 Abnormal result of cardiovascular function study, unspecified: Secondary | ICD-10-CM

## 2013-10-09 DIAGNOSIS — I208 Other forms of angina pectoris: Secondary | ICD-10-CM | POA: Diagnosis present

## 2013-10-09 DIAGNOSIS — R079 Chest pain, unspecified: Secondary | ICD-10-CM | POA: Diagnosis not present

## 2013-10-09 HISTORY — PX: LEFT HEART CATHETERIZATION WITH CORONARY ANGIOGRAM: SHX5451

## 2013-10-09 LAB — CBC
HCT: 48.1 % (ref 39.0–52.0)
Hemoglobin: 16.9 g/dL (ref 13.0–17.0)
MCH: 31.9 pg (ref 26.0–34.0)
MCHC: 35.1 g/dL (ref 30.0–36.0)
MCV: 90.8 fL (ref 78.0–100.0)
Platelets: 215 10*3/uL (ref 150–400)
RBC: 5.3 MIL/uL (ref 4.22–5.81)
RDW: 13.2 % (ref 11.5–15.5)
WBC: 7.1 10*3/uL (ref 4.0–10.5)

## 2013-10-09 LAB — URINE MICROSCOPIC-ADD ON

## 2013-10-09 LAB — BASIC METABOLIC PANEL
BUN: 19 mg/dL (ref 6–23)
CO2: 25 mEq/L (ref 19–32)
Calcium: 9.5 mg/dL (ref 8.4–10.5)
Chloride: 99 mEq/L (ref 96–112)
Creatinine, Ser: 0.75 mg/dL (ref 0.50–1.35)
GFR calc Af Amer: >90 mL/min (ref >90)
GFR calc non Af Amer: >90 mL/min (ref >90)
Glucose, Bld: 108 mg/dL — ABNORMAL HIGH (ref 70–99)
Potassium: 3.9 mEq/L (ref 3.7–5.3)
Sodium: 140 mEq/L (ref 137–147)

## 2013-10-09 LAB — URINALYSIS, ROUTINE W REFLEX MICROSCOPIC
Bilirubin Urine: NEGATIVE
Glucose, UA: NEGATIVE mg/dL
Hgb urine dipstick: NEGATIVE
Ketones, ur: NEGATIVE mg/dL
Nitrite: NEGATIVE
Protein, ur: NEGATIVE mg/dL
Specific Gravity, Urine: 1.043 — ABNORMAL HIGH (ref 1.005–1.030)
Urobilinogen, UA: 1 mg/dL (ref 0.0–1.0)
pH: 6.5 (ref 5.0–8.0)

## 2013-10-09 LAB — PROTIME-INR
INR: 0.91 (ref 0.00–1.49)
Prothrombin Time: 12.1 seconds (ref 11.6–15.2)

## 2013-10-09 SURGERY — LEFT HEART CATHETERIZATION WITH CORONARY ANGIOGRAM
Anesthesia: LOCAL

## 2013-10-09 MED ORDER — FENTANYL CITRATE 0.05 MG/ML IJ SOLN
INTRAMUSCULAR | Status: AC
Start: 2013-10-09 — End: 2013-10-09
  Filled 2013-10-09: qty 2

## 2013-10-09 MED ORDER — METOPROLOL TARTRATE 25 MG PO TABS
25.0000 mg | ORAL_TABLET | Freq: Every day | ORAL | Status: DC
  Administered 2013-10-10: 25 mg via ORAL
  Filled 2013-10-09: qty 1

## 2013-10-09 MED ORDER — MELOXICAM 7.5 MG PO TABS
7.5000 mg | ORAL_TABLET | Freq: Every day | ORAL | Status: DC
  Administered 2013-10-09: 7.5 mg via ORAL
  Filled 2013-10-09 (×2): qty 1

## 2013-10-09 MED ORDER — SODIUM CHLORIDE 0.9 % IJ SOLN: 3.0000 mL | Freq: Two times a day (BID) | INTRAMUSCULAR | Status: DC

## 2013-10-09 MED ORDER — SODIUM CHLORIDE 0.9 % IV SOLN: INTRAVENOUS | Status: AC

## 2013-10-09 MED ORDER — ADULT MULTIVITAMIN W/MINERALS CH
1.0000 | ORAL_TABLET | Freq: Every day | ORAL | Status: DC
  Administered 2013-10-10: 1 via ORAL
  Filled 2013-10-09 (×2): qty 1

## 2013-10-09 MED ORDER — SODIUM CHLORIDE 0.9 % IV SOLN: INTRAVENOUS | Status: DC

## 2013-10-09 MED ORDER — MIDAZOLAM HCL 2 MG/2ML IJ SOLN
INTRAMUSCULAR | Status: AC
Start: 2013-10-09 — End: 2013-10-09
  Filled 2013-10-09: qty 2

## 2013-10-09 MED ORDER — SIMVASTATIN 20 MG PO TABS
20.0000 mg | ORAL_TABLET | Freq: Every day | ORAL | Status: DC
  Administered 2013-10-09 – 2013-10-10 (×2): 20 mg via ORAL
  Filled 2013-10-09 (×2): qty 1

## 2013-10-09 MED ORDER — PANTOPRAZOLE SODIUM 40 MG PO TBEC
40.0000 mg | DELAYED_RELEASE_TABLET | Freq: Every day | ORAL | Status: DC
  Administered 2013-10-10: 40 mg via ORAL
  Filled 2013-10-09: qty 1

## 2013-10-09 MED ORDER — SODIUM CHLORIDE 0.9 % IJ SOLN: 3.0000 mL | INTRAMUSCULAR | Status: DC | PRN

## 2013-10-09 MED ORDER — SODIUM CHLORIDE 0.9 % IV SOLN: 250.0000 mL | INTRAVENOUS | Status: DC | PRN

## 2013-10-09 MED ORDER — HEPARIN (PORCINE) IN NACL 2-0.9 UNIT/ML-% IJ SOLN
INTRAMUSCULAR | Status: AC
  Filled 2013-10-09: qty 1000

## 2013-10-09 MED ORDER — ALLOPURINOL 300 MG PO TABS
300.0000 mg | ORAL_TABLET | Freq: Every day | ORAL | Status: DC
  Administered 2013-10-10: 300 mg via ORAL
  Filled 2013-10-09 (×2): qty 1

## 2013-10-09 MED ORDER — HYDROCHLOROTHIAZIDE 50 MG PO TABS
50.0000 mg | ORAL_TABLET | Freq: Every day | ORAL | Status: DC
  Administered 2013-10-10: 50 mg via ORAL
  Filled 2013-10-09 (×2): qty 1

## 2013-10-09 MED ORDER — ASPIRIN EC 81 MG PO TBEC
81.0000 mg | DELAYED_RELEASE_TABLET | Freq: Every day | ORAL | Status: DC
  Administered 2013-10-10: 81 mg via ORAL
  Filled 2013-10-09 (×2): qty 1

## 2013-10-09 MED ORDER — ASPIRIN 81 MG PO CHEW: 81.0000 mg | CHEWABLE_TABLET | ORAL | Status: DC

## 2013-10-09 MED ORDER — OXYCODONE-ACETAMINOPHEN 5-325 MG PO TABS: 1.0000 | ORAL_TABLET | ORAL | Status: DC | PRN

## 2013-10-09 MED ORDER — OMEGA-3-ACID ETHYL ESTERS 1 G PO CAPS
1.0000 g | ORAL_CAPSULE | Freq: Every day | ORAL | Status: DC
  Administered 2013-10-10: 1 g via ORAL
  Filled 2013-10-09 (×2): qty 1

## 2013-10-09 MED ORDER — LIDOCAINE HCL (PF) 1 % IJ SOLN
INTRAMUSCULAR | Status: AC
Start: 2013-10-09 — End: 2013-10-09
  Filled 2013-10-09: qty 30

## 2013-10-09 MED ORDER — NITROGLYCERIN 0.2 MG/ML ON CALL CATH LAB
INTRAVENOUS | Status: AC
  Filled 2013-10-09: qty 1

## 2013-10-09 MED ORDER — MORPHINE SULFATE 2 MG/ML IJ SOLN: 2.0000 mg | INTRAMUSCULAR | Status: DC | PRN

## 2013-10-09 NOTE — Consult Note (Signed)
[address]       [place] [address]             [phone]          CARDIOTHORACIC SURGERY CONSULTATION REPORT  PCP is HAWKINS,EDWARD L, MD Referring Provider is Burnell Blanks, MD Primary Cardiologist is MCDOWELL, Aloha Gell, MD   Reason for consultation:  Severe 3-vessel CAD  HPI:  Patient is a 68 year old white male from [place] with no previous history of coronary artery disease but risk factors notable for history of hypertension, hyperlipidemia, and borderline type 2 diabetes mellitus. The patient describes a six-month history of exertional chest discomfort consistent with classic stable angina pectoris. Approximate 6 months ago he noticed that while walking at a brisk pace or up an incline he would develop substernal chest pressure radiating across his chest. Oftentimes this would be associated with a feeling that he needed to belch. Symptoms always resolve with stopping to rest. Over the past 6 months the symptoms have remained essentially stable, and he denies any increase in frequency or severity. He has never had any chest discomfort at rest or at night. He underwent an echocardiogram demonstrating the presence of severe left ventricular hypertrophy with normal left ventricular systolic function, ejection fraction estimated 60-65%. There was no evidence for SAM or dynamic left ventricular outflow tract obstruction. Nuclear stress Cardiolite exam was performed demonstrating a large area of reversible ischemia, and the patient had both symptoms and ST segment changes on EKG during peak stress. He subsequently underwent elective diagnostic cardiac catheterization earlier today by Dr. Angelena Form. This demonstrates severe three-vessel coronary artery disease with coronary anatomy relatively unfavorable for percutaneous coronary intervention.  The patient has been referred for surgical consultation.  The patient lives with his wife and works full-time in a  Systems developer. This requires fairly strenuous physical activity. He remains physically active otherwise and has not had other significant physical limitations.  He describes a six-month history of exertional chest pain as noted. He denies any history of nocturnal chest pain or shortness of breath. He denies any episodes of chest pain at rest. He denies any history of exertional shortness of breath, PND, orthopnea, or lower extremity edema. He has had some dizzy spells and near syncope in the remote past, but nothing recently.  Past Medical History  Diagnosis Date  . Essential hypertension, benign   . Nephrolithiasis   . GERD (gastroesophageal reflux disease)   . Arthritis   . DDD (degenerative disc disease), lumbar     Dr. Carloyn Manner  . Mixed hyperlipidemia   . Lumbago     Past Surgical History  Procedure Laterality Date  . Total knee arthroplasty Left   . Bicept tenodesis  11/26/2011    Procedure: BICEPT TENODESIS;  Surgeon: Augustin Schooling, MD;  Location: [facility];  Service: Orthopedics;  Laterality: Left;    Family History  Problem Relation Age of Onset  . Lung cancer Father   . Heart failure Mother   . Hypertension Sister     History   Social History  . Marital Status: Married    Spouse Name: N/A    Number of Children: N/A  . Years of Education: N/A   Occupational History  . lumber mills    Social History Main Topics  . Smoking status: Former Smoker -- 2.00 packs/day for 30 years    Types: Cigarettes    Quit date: 11/24/1983  . Smokeless tobacco: Former Systems developer  Types: Chew  . Alcohol Use: Yes     Comment: Occassional  . Drug Use: No  . Sexual Activity: Yes   Other Topics Concern  . Not on file   Social History Narrative  . No narrative on file    Prior to Admission medications   Medication Sig Start Date End Date Taking? Authorizing Provider  acetaminophen (TYLENOL) 500 MG tablet Take 1,000 mg by mouth every 6 (six) hours as needed. For pain   Yes Historical  Provider, MD  allopurinol (ZYLOPRIM) 300 MG tablet Take 300 mg by mouth daily.   Yes Historical Provider, MD  aspirin EC 81 MG tablet Take 81 mg by mouth daily.   Yes Historical Provider, MD  Glucosamine-Chondroitin (OSTEO BI-FLEX REGULAR STRENGTH PO) Take 1 tablet by mouth daily.   Yes Historical Provider, MD  hydrochlorothiazide (HYDRODIURIL) 50 MG tablet Take 50 mg by mouth daily.   Yes Historical Provider, MD  meloxicam (MOBIC) 7.5 MG tablet Take 7.5 mg by mouth at bedtime.   Yes Historical Provider, MD  metoprolol (LOPRESSOR) 50 MG tablet Take 25 mg by mouth daily.   Yes Historical Provider, MD  Multiple Vitamin (MULITIVITAMIN WITH MINERALS) TABS Take 1 tablet by mouth daily.   Yes Historical Provider, MD  Omega-3 Fatty Acids (FISH OIL) 1200 MG CAPS Take 1 capsule by mouth daily.   Yes Historical Provider, MD  omeprazole (PRILOSEC) 20 MG capsule Take 20 mg by mouth daily.   Yes Historical Provider, MD  potassium chloride (K-DUR,KLOR-CON) 10 MEQ tablet Take 10 mEq by mouth daily.   Yes Historical Provider, MD  pravastatin (PRAVACHOL) 40 MG tablet Take 40 mg by mouth daily.   Yes Historical Provider, MD  triamcinolone cream (KENALOG) 0.1 % Apply 1 application topically 2 (two) times daily.   Yes Historical Provider, MD    Current Facility-Administered Medications  Medication Dose Route Frequency Provider Last Rate Last Dose  . 0.9 %  sodium chloride infusion   Intravenous Continuous Burnell Blanks, MD 50 mL/hr at 10/09/13 1224    . allopurinol (ZYLOPRIM) tablet 300 mg  300 mg Oral Daily Burnell Blanks, MD      . aspirin EC tablet 81 mg  81 mg Oral Daily Burnell Blanks, MD      . hydrochlorothiazide (HYDRODIURIL) tablet 50 mg  50 mg Oral Daily Burnell Blanks, MD      . meloxicam (MOBIC) tablet 7.5 mg  7.5 mg Oral QHS Burnell Blanks, MD      . Derrill Memo ON 10/10/2013] metoprolol tartrate (LOPRESSOR) tablet 25 mg  25 mg Oral Daily Burnell Blanks, MD       . morphine 2 MG/ML injection 2 mg  2 mg Intravenous Q1H PRN Burnell Blanks, MD      . multivitamin with minerals tablet 1 tablet  1 tablet Oral Daily Burnell Blanks, MD      . omega-3 acid ethyl esters (LOVAZA) capsule 1 g  1 g Oral Daily Burnell Blanks, MD      . oxyCODONE-acetaminophen (PERCOCET/ROXICET) 5-325 MG per tablet 1-2 tablet  1-2 tablet Oral Q4H PRN Burnell Blanks, MD      . pantoprazole (PROTONIX) EC tablet 40 mg  40 mg Oral Daily Burnell Blanks, MD      . simvastatin (ZOCOR) tablet 20 mg  20 mg Oral q1800 Burnell Blanks, MD        No Known Allergies    Review of Systems:  General:  normal appetite, normal energy, no weight gain, no weight loss, no fever  Cardiac:  + chest pain with exertion, no chest pain at rest, no SOB with exertion, no resting SOB, no PND, no orthopnea, no palpitations, no arrhythmia, no atrial fibrillation, no LE edema, occasional dizzy spells, + near syncope in the remote past  Respiratory:  no shortness of breath, no home oxygen, no productive cough, no dry cough, no bronchitis, no wheezing, no hemoptysis, no asthma, no pain with inspiration or cough, no sleep apnea, no CPAP at night  GI:   no difficulty swallowing, no reflux, no frequent heartburn, no hiatal hernia, no abdominal pain, no constipation, no diarrhea, no hematochezia, no hematemesis, no melena  GU:   no dysuria,  + frequency, no urinary tract infection, no hematuria, no enlarged prostate, + kidney stones, no kidney disease  Vascular:  no pain suggestive of claudication, no pain in feet, no leg cramps, no varicose veins, no DVT, no non-healing foot ulcer  Neuro:   no stroke, no TIA's, no seizures, no headaches, no temporary blindness one eye,  no slurred speech, no peripheral neuropathy, no chronic pain, no instability of gait, no memory/cognitive dysfunction  Musculoskeletal: + arthritis primarily in lower back, no joint swelling, no myalgias,  no difficulty walking, normal mobility   Skin:   no rash, no itching, no skin infections, no pressure sores or ulcerations  Psych:   no anxiety, no depression, no nervousness, no unusual recent stress  Eyes:   no blurry vision, no floaters, no recent vision changes, + wears glasses or contacts  ENT:   + hearing loss, no loose or painful teeth, no dentures, last saw dentist   Hematologic:  no easy bruising, no abnormal bleeding, no clotting disorder, no frequent epistaxis  Endocrine:  + borderline diabetes, does not check CBG's at home     Physical Exam:   BP 132/85  Pulse 53  Temp(Src) 98.2 F (36.8 C) (Oral)  Resp 20  Ht 6\' 1"  (1.854 m)  Wt 92.987 kg (205 lb)  BMI 27.05 kg/m2  SpO2 95%  General:    well-appearing  HEENT:  Unremarkable   Neck:   no JVD, no bruits, no adenopathy   Chest:   clear to auscultation, symmetrical breath sounds, no wheezes, no rhonchi   CV:   RRR, no  murmur   Abdomen:  soft, non-tender, no masses   Extremities:  warm, well-perfused, pulses palpable, no lower extremity edema  Rectal/GU  Deferred  Neuro:   Grossly non-focal and symmetrical throughout  Skin:   Clean and dry, no rashes, no breakdown  Diagnostic Tests:  Transthoracic Echocardiography  Patient:    Brandon [name], Brandon [name] MR #:       [id number] Study Date: 09/19/2013 Gender:     M Age:        18 Height:     185.4cm Weight:     93.4kg BSA:        2.30m^2 Pt. Status: Room:    ATTENDING    Buel Ream, Lisabeth Devoid  SONOGRAPHER  Lina Sar, RDCS  PERFORMING   Chmg, Forestine Na cc:  ------------------------------------------------------------ LV EF: 60% -   65%  ------------------------------------------------------------ Indications:      Angina - stable 413.9.  ------------------------------------------------------------ History:   Risk factors:  GERD, family history of hypertrophic obstructive cardiomyopathy  Hypertension. Dyslipidemia.  ------------------------------------------------------------ Study Conclusions  - Left ventricle: Normal  left ventricular outflow tract   gradient. Wall thickness was increased in a pattern of   severe LVH. Systolic function was normal. The estimated   ejection fraction was in the range of 60% to 65%. Wall   motion was normal; there were no regional wall motion   abnormalities. There was an increased relative   contribution of atrial contraction to ventricular filling.   Doppler parameters are consistent with abnormal left   ventricular relaxation (grade 1 diastolic dysfunction).   There was no evidence of elevated ventricular filling   pressure by Doppler parameters. - Aortic valve: Trileaflet; mildly thickened leaflets. There   was no stenosis. - Mitral valve: No systolic motion of the anterior mitral   leaflet was noted. A small torn chord was appreciated,   with no evidence of mitral regurgitation. Calcified   annulus. - Left atrium: The atrium was mildly dilated. Transthoracic echocardiography.  M-mode, complete 2D, spectral Doppler, and color Doppler.  Height:  Height: 185.4cm. Height: 73in.  Weight:  Weight: 93.4kg. Weight: 205.6lb.  Body mass index:  BMI: 27.2kg/m^2.  Body surface area:    BSA: 2.82m^2.  Blood pressure:     160/90.  Patient status:  Outpatient.  Location:  Echo laboratory.  ------------------------------------------------------------  ------------------------------------------------------------ Left ventricle:  Normal left ventricular outflow tract gradient.  Wall thickness was increased in a pattern of severe LVH.   Systolic function was normal. The estimated ejection fraction was in the range of 60% to 65%. Wall motion was normal; there were no regional wall motion abnormalities. There was an increased relative contribution of atrial contraction to ventricular filling. Doppler parameters are consistent with abnormal left  ventricular relaxation (grade 1 diastolic dysfunction). There was no evidence of elevated ventricular filling pressure by Doppler parameters.  ------------------------------------------------------------ Aortic valve:   Trileaflet; mildly thickened leaflets. Doppler:   There was no stenosis.    No regurgitation.  ------------------------------------------------------------ Aorta:  Aortic root: The aortic root was normal in size.  ------------------------------------------------------------ Mitral valve:  No systolic motion of the anterior mitral leaflet was noted. A small torn chord was appreciated with some redundance of the anterior leaflet, with no evidence of mitral regurgitation.  Calcified annulus. Leaflet separation was normal.  Doppler:  Transvalvular velocity was within the normal range. There was no evidence for stenosis.  No regurgitation.  ------------------------------------------------------------ Left atrium:  The atrium was mildly dilated.  ------------------------------------------------------------ Atrial septum:  No defect or patent foramen ovale was identified.  ------------------------------------------------------------ Right ventricle:  The cavity size was normal. Wall thickness was normal. Systolic function was normal.  ------------------------------------------------------------ Pulmonic valve:    The valve appears to be grossly normal.  Doppler:   No significant regurgitation.  ------------------------------------------------------------ Tricuspid valve:   Structurally normal valve.   Leaflet separation was normal.  Doppler:  Transvalvular velocity was within the normal range.  Trivial regurgitation.  ------------------------------------------------------------ Right atrium:  The atrium was normal in size.  ------------------------------------------------------------ Pericardium:  There was no pericardial  effusion.  ------------------------------------------------------------ Systemic veins: Inferior vena cava: The vessel was normal in size; the respirophasic diameter changes were in the normal range (= 50%); findings are consistent with normal central venous pressure.  ------------------------------------------------------------  2D measurements        Normal  Doppler measurements   Normal Left ventricle                 Main pulmonary LVID ED,   34.6 mm     43-52   artery chord,  Pressure, S    24 mm   =30 PLAX                                             Hg LVID ES,   17.5 mm     23-38   Left ventricle chord,                         Ea, lat ann,  7.9 cm/s ------ PLAX                           tiss DP FS, chord,   49 %      >29     E/Ea, lat     6.5      ------ PLAX                           ann, tiss DP    6 LVPW, ED   18.5 mm     ------  Ea, med ann,  5.2 cm/s ------ IVS/LVPW   1.05        <1.3    tiss DP ratio, ED                      E/Ea, med     9.9      ------ Ventricular septum             ann, tiss DP    6 IVS, ED    19.5 mm     ------  LVOT Aorta                          Peak vel, S   112 cm/s ------ Root diam,   33 mm     ------  VTI, S        26. cm   ------ ED                                             9 Left atrium                    Peak            5 mm   ------ AP dim       43 mm     ------  gradient, S       Hg AP dim     1.97 cm/m^2 <2.2    Mitral valve index                          Peak E vel    51. cm/s ------                                                8                                Peak  A vel    57. cm/s ------                                                4                                Deceleration  216 ms   150-23                                time                   0                                Peak E/A      0.9      ------                                ratio                                Tricuspid valve                                 Regurg peak   229 cm/s ------                                vel                                Peak RV-RA     21 mm   ------                                gradient, S       Hg                                Systemic veins                                Estimated CVP   3 mm   ------                                                  Hg                                Right ventricle                                Pressure, S  24 mm   <30                                                  Hg                                Sa vel, lat   10. cm/s ------                                ann, tiss DP    8   ------------------------------------------------------------ Prepared and Electronically Authenticated by  Kate Sable, MD 2015-02-04T14:[id number]    MYOCARDIAL IMAGING WITH SPECT (REST AND EXERCISE)   GATED LEFT VENTRICULAR WALL MOTION STUDY   LEFT VENTRICULAR EJECTION FRACTION   TECHNIQUE: Standard myocardial SPECT imaging was performed after resting intravenous injection of 10 mCi Tc-26m sestamibi. Subsequently, exercise tolerance test was performed by the patient under the supervision of the Cardiology staff. At peak-stress, 30 mCi Tc-74m sestamibi was injected intravenously and standard myocardial SPECT imaging was performed. Quantitative gated imaging was also performed to evaluate left ventricular wall motion, and estimate left ventricular ejection fraction.   COMPARISON:  None.   FINDINGS: The patient was stressed according to the Bruce protocol for 6 min and 39 seconds, achieving a work level of 9 Mets. The resting heart rate of 51 beats per min rose to a maximal heart rate of 121 beats per min. This value represents 79% of the maximal, age predicted heart rate. The resting blood pressure of 146/97 rose to 213/103. The stress test was stopped due to chest discomfort and dynamic ST segment changes. Sublingual nitroglycerin was administered to  the patient with alleviation of symptoms.   The resting ECG shows sinus bradycardia, 52 beats per min. With stress, there were nonspecific ST segment changes in the inferior leads, less than 1 mm horizontal ST segment depression in V4, and 1.5 mm horizontal and then downsloping ST segment depressions in V5-6. PAC's and isolated PVC's were also noted.   Analysis of the raw data showed no significant extracardiac radiotracer uptake. Analysis of the perfusion stress images showed moderate sized, moderate to severe in intensity, apical anterior, apical anteroseptal, apical inferior, and inferoseptal defects with normalized perfusion on the resting images. This is indicative of a significant degree of ischemia in the LAD distribution. There is also a moderate-sized, mild to moderate intensity, fixed mid inferior wall defect, indicative of myocardial scar with some degree of soft tissue attenuation. The reversible abnormalities demonstrated hypokinesis. Left ventricular systolic function was normal, calculated LV EF 57%. The summed difference score was 9.   IMPRESSION: 1. Abnormal exercise Cardiolite stress test with complaints of chest pain reported, relieved with SL nitroglycerin.   2. A large area of ischemia is noted in the apical and inferoseptal walls.   3. Myocardial scar with overlying soft tissue attenuation is noted in the mid-inferior wall.   4. Left ventricular systolic function is normal, calculated LVEF 57%. The ischemic regions demonstrated hypokinesis.   5.  Significant ST depressions also noted as detailed above.   6. This represents a high-risk study for future cardiovascular events.     Electronically Signed   By: Kate Sable   On: 10/01/2013 18:57  Cardiac Catheterization Operative Report  LENUS DEMATTEO [id number] 2/24/201511:34 AM Alonza Bogus, MD  Procedure Performed:   1. Left Heart Catheterization 2. Selective Coronary  Angiography 3. Left ventricular angiogram  Operator: Lauree Chandler, MD  Arterial access site:  Right radial artery.   Indication: 68 yo male with history of HTN, DM, severe LVH, HLD, former tobacco abuse with recent exertional chest pain c/w unstable angina. Stress myoview with anterior ischemia.                                       Procedure Details: The risks, benefits, complications, treatment options, and expected outcomes were discussed with the patient. The patient and/or family concurred with the proposed plan, giving informed consent. The patient was brought to the cath lab after IV hydration was begun and oral premedication was given. The patient was further sedated with Versed and Fentanyl. The right wrist was assessed with an Allens test which was positive. The right wrist was prepped and draped in a sterile fashion. 1% lidocaine was used for local anesthesia. Using the modified Seldinger access technique, a 5 French sheath was placed in the right radial artery. 3 mg Verapamil was given through the sheath. 4500 units IV heparin was given. Standard diagnostic catheters were used to perform selective coronary angiography. A pigtail catheter was used to perform a left ventricular angiogram. The sheath was removed from the right radial artery and a Terumo hemostasis band was applied at the arteriotomy site on the right wrist.   There were no immediate complications. The patient was taken to the recovery area in stable condition.   Hemodynamic Findings: Central aortic pressure: 124/68 Left ventricular pressure: 122/6/12  Angiographic Findings:  Left main: No obstructive disease  Left Anterior Descending Artery: Large caliber vessel that courses to the apex. The ostium has a hazy 90% stenosis. The proximal vessel is heavily calcified with diffuse 40% stenosis. The mid vessel has a focal 95% stenosis at the takeoff of a moderate caliber septal perforating vessel. There is another  long area of 40-50% stenosis in the mid LAD. The distal LAD has 30% stenosis. There are several very small caliber diagonal branches.    Circumflex Artery: Large caliber vessel with large caliber intermediate branch. The intermediate branch has a hazy 95% ostial stenosis. The Circumflex has mild proximal plaque then gives off a large obtuse marginal branch. The obtuse marginal branch has mild plaque. The AV groove Circumflex has 60% stenosis just after the takeoff of the OM branch.    Right Coronary Artery: Large dominant vessel with heavy calcification noted in the proximal, mid and distal vessel. The proximal and mid vessel has diffuse 30% stenosis. The distal vessel has long 80%, hazy stenosis. The PDA and posterolateral branch has mild plaque.   Left Ventricular Angiogram: LVEF=65%  Impression: 1. Triple vessel CAD 2. Preserved LV systolic function with LVH 3. Unstable angina.    Recommendations: He has multi-vessel CAD with severe disease in the ostium of the LAD and the ostium of the large intermediate branch as well as severe disease in the distal RCA. His anatomy is not favorable for PCI. Will consult CT surgery for CABG.          Complications:  None. The patient tolerated the procedure well.                    Impression:  The  patient has severe three-vessel coronary artery disease including complex ostial stenosis of the left anterior descending coronary artery with coronary anatomy relatively unfavorable for percutaneous coronary intervention.  Left ventricular systolic function is preserved although the patient does have severe left ventricular hypertrophy and likely significant diastolic dysfunction.  He presents with stable symptoms of exertional angina. I agree he would best be treated with surgical revascularization.    Plan:  I have reviewed the indications, risks, and potential benefits of coronary artery bypass grafting with the patient and his wife this evening.   Alternative treatment strategies have been discussed.  The patient understands and accepts all potential associated risks of surgery including but not limited to risk of death, stroke or other neurologic complication, myocardial infarction, congestive heart failure, respiratory failure, renal failure, bleeding requiring blood transfusion and/or reexploration, aortic dissection or other major vascular complication, arrhythmia, heart block or bradycardia requiring permanent pacemaker, pneumonia, pleural effusion, wound infection, pulmonary embolus or other thromboembolic complication, chronic pain or other delayed complications related to median sternotomy, or the late recurrence of symptomatic ischemic heart disease and/or congestive heart failure.  The importance of long term risk modification have been emphasized.  All questions answered.  We tentatively plan for surgery on Friday, 10/19/2013.  I have emphasized to the patient the fact that he should avoid all strenuous physical activity between now and the time of surgery.    I spent in excess of 110 minutes during the conduct of this hospital consultation and >50% of this time involved direct face-to-face encounter for counseling and/or coordination of the patient's care.  Valentina Gu. Roxy Manns, MD 10/09/2013 5:47 PM

## 2013-10-09 NOTE — Interval H&P Note (Signed)
History and Physical Interval Note:  10/09/2013 10:46 AM  Brandon [name]  has presented today for cardiac cath with the diagnosis of chest pain  The various methods of treatment have been discussed with the patient and family. After consideration of risks, benefits and other options for treatment, the patient has consented to  Procedure(s): LEFT HEART CATHETERIZATION WITH CORONARY ANGIOGRAM (N/A) as a surgical intervention .  The patient's history has been reviewed, patient examined, no change in status, stable for surgery.  I have reviewed the patient's chart and labs.  Questions were answered to the patient's satisfaction.    Cath Lab Visit (complete for each Cath Lab visit)  Clinical Evaluation Leading to the Procedure:   ACS: no  Non-ACS:    Anginal Classification: CCS III  Anti-ischemic medical therapy: Minimal Therapy (1 class of medications)  Non-Invasive Test Results: High-risk stress test findings: cardiac mortality >3%/year  Prior CABG: No previous CABG        Brandon [name]

## 2013-10-09 NOTE — CV Procedure (Signed)
      Cardiac Catheterization Operative Report  Brandon [name] [id number] 2/24/201511:34 AM Alonza Bogus, MD  Procedure Performed:  1. Left Heart Catheterization 2. Selective Coronary Angiography 3. Left ventricular angiogram  Operator: Lauree Chandler, MD  Arterial access site:  Right radial artery.   Indication: 68 yo male with history of HTN, DM, severe LVH, HLD, former tobacco abuse with recent exertional chest pain c/w unstable angina. Stress myoview with anterior ischemia.                                       Procedure Details: The risks, benefits, complications, treatment options, and expected outcomes were discussed with the patient. The patient and/or family concurred with the proposed plan, giving informed consent. The patient was brought to the cath lab after IV hydration was begun and oral premedication was given. The patient was further sedated with Versed and Fentanyl. The right wrist was assessed with an Allens test which was positive. The right wrist was prepped and draped in a sterile fashion. 1% lidocaine was used for local anesthesia. Using the modified Seldinger access technique, a 5 French sheath was placed in the right radial artery. 3 mg Verapamil was given through the sheath. 4500 units IV heparin was given. Standard diagnostic catheters were used to perform selective coronary angiography. A pigtail catheter was used to perform a left ventricular angiogram. The sheath was removed from the right radial artery and a Terumo hemostasis band was applied at the arteriotomy site on the right wrist.   There were no immediate complications. The patient was taken to the recovery area in stable condition.   Hemodynamic Findings: Central aortic pressure: 124/68 Left ventricular pressure: 122/6/12  Angiographic Findings:  Left main: No obstructive disease  Left Anterior Descending Artery: Large caliber vessel that courses to the apex. The ostium has a hazy 90%  stenosis. The proximal vessel is heavily calcified with diffuse 40% stenosis. The mid vessel has a focal 95% stenosis at the takeoff of a moderate caliber septal perforating vessel. There is another long area of 40-50% stenosis in the mid LAD. The distal LAD has 30% stenosis. There are several very small caliber diagonal branches.   Circumflex Artery: Large caliber vessel with large caliber intermediate branch. The intermediate branch has a hazy 95% ostial stenosis. The Circumflex has mild proximal plaque then gives off a large obtuse marginal branch. The obtuse marginal branch has mild plaque. The AV groove Circumflex has 60% stenosis just after the takeoff of the OM branch.   Right Coronary Artery: Large dominant vessel with heavy calcification noted in the proximal, mid and distal vessel. The proximal and mid vessel has diffuse 30% stenosis. The distal vessel has long 80%, hazy stenosis. The PDA and posterolateral branch has mild plaque.   Left Ventricular Angiogram: LVEF=65%  Impression: 1. Triple vessel CAD 2. Preserved LV systolic function with LVH 3. Unstable angina.   Recommendations: He has multi-vessel CAD with severe disease in the ostium of the LAD and the ostium of the large intermediate branch as well as severe disease in the distal RCA. His anatomy is not favorable for PCI. Will consult CT surgery for CABG.        Complications:  None. The patient tolerated the procedure well.

## 2013-10-09 NOTE — Progress Notes (Signed)
UR Completed Cloie Wooden Graves-Bigelow, RN,BSN [phone]  

## 2013-10-09 NOTE — H&P (View-Only) (Signed)
Clinical Summary [name] is a 68 y.o.male seen recently in consultation for symptoms of exertional angina in the setting of hypertension and borderline type 2 diabetes mellitus. He also had family history of hypertrophic cardiomyopathy (mother). Followup cardiac testing was undertaken for further evaluation.  Echocardiogram in February 2015 showed severe LVH with LVEF 20-25%, grade 1 diastolic dysfunction, no evidence of SAM or obvious outflow obstruction, mild left atrial enlargement, no major valvular abnormalities. Exercise Cardiolite also performed in February 2015 showed a large area of ischemia in the apical and inferoseptal myocardium, LVEF 57%, ST segment abnormalities by ECG.  He is here with his wife today, we discussed the results of his testing. We reviewed risks and benefits of proceeding to a diagnostic cardiac catheterization to better understand his coronary anatomy and determine if any revascularization options need to be considered. He is in agreement to proceed.   No Known Allergies  Current Outpatient Prescriptions  Medication Sig Dispense Refill  . acetaminophen (TYLENOL) 500 MG tablet Take 1,000 mg by mouth every 6 (six) hours as needed. For pain      . allopurinol (ZYLOPRIM) 300 MG tablet Take 300 mg by mouth daily.      Marland Kitchen aspirin EC 81 MG tablet Take 81 mg by mouth daily.      . Glucosamine-Chondroitin (OSTEO BI-FLEX REGULAR STRENGTH PO) Take 1 tablet by mouth daily.      . hydrochlorothiazide (HYDRODIURIL) 50 MG tablet Take 50 mg by mouth daily.      . meloxicam (MOBIC) 7.5 MG tablet Take 7.5 mg by mouth at bedtime.      . metoprolol (LOPRESSOR) 50 MG tablet Take 25 mg by mouth daily.      . Multiple Vitamin (MULITIVITAMIN WITH MINERALS) TABS Take 1 tablet by mouth daily.      . Omega-3 Fatty Acids (FISH OIL) 1200 MG CAPS Take 1 capsule by mouth daily.      Marland Kitchen omeprazole (PRILOSEC) 20 MG capsule Take 20 mg by mouth daily.      . potassium chloride  (K-DUR,KLOR-CON) 10 MEQ tablet Take 10 mEq by mouth daily.      . pravastatin (PRAVACHOL) 40 MG tablet Take 40 mg by mouth daily.       No current facility-administered medications for this visit.    Past Medical History  Diagnosis Date  . Essential hypertension, benign   . Nephrolithiasis   . GERD (gastroesophageal reflux disease)   . Arthritis   . DDD (degenerative disc disease), lumbar     Dr. Carloyn Manner  . Mixed hyperlipidemia   . Lumbago     Past Surgical History  Procedure Laterality Date  . Left knee replacement    . Bicept tenodesis  11/26/2011    Procedure: BICEPT TENODESIS;  Surgeon: Augustin Schooling, MD;  Location: [facility];  Service: Orthopedics;  Laterality: Left;    Family History  Problem Relation Age of Onset  . Lung cancer Father   . Heart failure Mother   . Hypertension Sister     Social History Brandon [name] reports that he quit smoking about 29 years ago. His smoking use included Cigarettes. He has a 60 pack-year smoking history. He has quit using smokeless tobacco. His smokeless tobacco use included Chew. Brandon [name] reports that he drinks alcohol.  Review of Systems No palpitations, dizziness, syncope. No orthopnea or PND. No obvious claudication.  Physical Examination Filed Vitals:   10/04/13 1321  BP: 139/89  Pulse: 54   Filed  Weights   10/04/13 1321  Weight: 208 lb (94.348 kg)   Patient appears comfortable at rest.  HEENT: Conjunctiva and lids normal, oropharynx clear.  Neck: Supple, no elevated JVP or carotid bruits, no thyromegaly.  Lungs: Clear to auscultation, nonlabored breathing at rest.  Cardiac: Regular rate and rhythm, no S3 or significant systolic murmur, no pericardial rub.  Abdomen: Soft, nontender, bowel sounds present.  Extremities: No pitting edema, distal pulses 2+.  Skin: Warm and dry.  Musculoskeletal: No kyphosis.  Neuropsychiatric: Alert and oriented x3, affect grossly appropriate.   Problem List and Plan   Abnormal  cardiovascular function study Recent exercise Cardiolite showed abnormal ST segment changes in the presence of angina, apical and inferoseptal ischemia. Plan is to proceed with a diagnostic cardiac catheterization to define coronary anatomy and determine if any exacerbation options are necessary. Procedure is being scheduled with Dr. McAlhany for early next week.  Essential hypertension, benign No change to current regimen.  Family history of hypertrophic cardiomyopathy Echocardiogram does show severe LVH but preserved LVEF and no outflow obstruction. Will focus on volume and blood pressure control.  Mixed hyperlipidemia Continues on Pravachol.    Samuel G. McDowell, M.D., F.A.C.C.   

## 2013-10-10 ENCOUNTER — Ambulatory Visit (HOSPITAL_COMMUNITY): Payer: Medicare Other

## 2013-10-10 ENCOUNTER — Other Ambulatory Visit: Payer: Self-pay | Admitting: *Deleted

## 2013-10-10 DIAGNOSIS — R9430 Abnormal result of cardiovascular function study, unspecified: Secondary | ICD-10-CM

## 2013-10-10 DIAGNOSIS — E119 Type 2 diabetes mellitus without complications: Secondary | ICD-10-CM | POA: Diagnosis not present

## 2013-10-10 DIAGNOSIS — I200 Unstable angina: Secondary | ICD-10-CM | POA: Diagnosis not present

## 2013-10-10 DIAGNOSIS — I2510 Atherosclerotic heart disease of native coronary artery without angina pectoris: Secondary | ICD-10-CM | POA: Diagnosis not present

## 2013-10-10 DIAGNOSIS — E782 Mixed hyperlipidemia: Secondary | ICD-10-CM | POA: Diagnosis not present

## 2013-10-10 DIAGNOSIS — Z01810 Encounter for preprocedural cardiovascular examination: Secondary | ICD-10-CM

## 2013-10-10 LAB — PULMONARY FUNCTION TEST
DL/VA % pred: 110 %
DL/VA: 5.28 ml/min/mmHg/L
DLCO cor % pred: 94 %
DLCO cor: 34.34 ml/min/mmHg
DLCO unc % pred: 100 %
DLCO unc: 36.38 ml/min/mmHg
FEF 25-75 Post: 3.49 L/sec
FEF 25-75 Pre: 3.06 L/sec
FEF2575-%Change-Post: 13 %
FEF2575-%Pred-Post: 122 %
FEF2575-%Pred-Pre: 107 %
FEV1-%Change-Post: 4 %
FEV1-%Pred-Post: 92 %
FEV1-%Pred-Pre: 88 %
FEV1-Post: 3.45 L
FEV1-Pre: 3.31 L
FEV1FVC-%Change-Post: -1 %
FEV1FVC-%Pred-Pre: 105 %
FEV6-%Change-Post: 5 %
FEV6-%Pred-Post: 94 %
FEV6-%Pred-Pre: 88 %
FEV6-Post: 4.48 L
FEV6-Pre: 4.23 L
FEV6FVC-%Change-Post: 0 %
FEV6FVC-%Pred-Post: 104 %
FEV6FVC-%Pred-Pre: 105 %
FVC-%Change-Post: 5 %
FVC-%Pred-Post: 89 %
FVC-%Pred-Pre: 84 %
FVC-Post: 4.49 L
FVC-Pre: 4.24 L
Post FEV1/FVC ratio: 77 %
Post FEV6/FVC ratio: 100 %
Pre FEV1/FVC ratio: 78 %
Pre FEV6/FVC Ratio: 100 %
RV % pred: 129 %
RV: 3.34 L
TLC % pred: 100 %
TLC: 7.69 L

## 2013-10-10 MED ORDER — ALBUTEROL SULFATE (2.5 MG/3ML) 0.083% IN NEBU
2.5000 mg | INHALATION_SOLUTION | Freq: Once | RESPIRATORY_TRACT | Status: AC
  Administered 2013-10-10: 2.5 mg via RESPIRATORY_TRACT

## 2013-10-10 MED ORDER — NITROGLYCERIN 0.4 MG SL SUBL: 0.4000 mg | SUBLINGUAL_TABLET | SUBLINGUAL | Status: DC | PRN

## 2013-10-10 NOTE — Progress Notes (Signed)
Reviewed discharge instructions with patient and wife. They stated their understanding.  Reviewed radial site care and IS/C/D with patient and wife.  Patient given instructions to return for presurgery.  Sanda Linger

## 2013-10-10 NOTE — Discharge Summary (Signed)
Physician Discharge Summary  Patient ID: Brandon [name] MRN: [id number] DOB/AGE: 02-22-1946 68 y.o.  Admit date: 10/09/2013 Discharge date: 10/10/2013  Admission Diagnoses: Exertional Angina  Discharge Diagnoses:  Principal Problem:   Exertional angina Active Problems:   Mixed hyperlipidemia   Essential hypertension, benign   Discharged Condition: stable  Hospital Course: [name] is a 68 y.o. male seen recently in consultation for symptoms of exertional angina in the setting of hypertension and borderline type 2 diabetes mellitus. He also had family history of hypertrophic cardiomyopathy (mother). Followup cardiac testing was undertaken for further evaluation.  Echocardiogram in February 2015 showed severe LVH with LVEF 75-64%, grade 1 diastolic dysfunction, no evidence of SAM or obvious outflow obstruction, mild left atrial enlargement, no major valvular abnormalities. Exercise Cardiolite also performed in February 2015 showed a large area of ischemia in the apical and inferoseptal myocardium, LVEF 57%, ST segment abnormalities by ECG. A left heart catheterization was recommended.   He presented to [facility] on 10/09/13 for the planned procedure. It was performed by Dr. Julianne Handler via the right radial artery. Complete angiographic data is listed below. Essentially, he was found to have multi-vessel CAD with severe disease in the ostium of the LAD and the ostium of the large intermediate branch as well as severe disease in the distal RCA. His anatomy is not favorable for PCI.  CT surgery was consulted for CABG. He was seen by Dr. Roxy Manns who agreed that he would be best treated with surgical revascularization. He is tentatively scheduled for CABG with Dr. Roxy Manns on 10/19/13.  Dr. Clydene Laming emphasized to the patient the fact that he should avoid all strenuous physical activity between now and the time of surgery. The patient was last seen and examined by Dr. Percival Spanish who determined he was stable for discharge,  as he had no post cath complications. He will return on 10/19/13 for CABG.    Consults: CT Surgery  Significant Diagnostic Studies:   [facility] 10/09/13 Hemodynamic Findings:  Central aortic pressure: 124/68  Left ventricular pressure: 122/6/12  Angiographic Findings:  Left main: No obstructive disease  Left Anterior Descending Artery: Large caliber vessel that courses to the apex. The ostium has a hazy 90% stenosis. The proximal vessel is heavily calcified with diffuse 40% stenosis. The mid vessel has a focal 95% stenosis at the takeoff of a moderate caliber septal perforating vessel. There is another long area of 40-50% stenosis in the mid LAD. The distal LAD has 30% stenosis. There are several very small caliber diagonal branches.  Circumflex Artery: Large caliber vessel with large caliber intermediate branch. The intermediate branch has a hazy 95% ostial stenosis. The Circumflex has mild proximal plaque then gives off a large obtuse marginal branch. The obtuse marginal branch has mild plaque. The AV groove Circumflex has 60% stenosis just after the takeoff of the OM branch.  Right Coronary Artery: Large dominant vessel with heavy calcification noted in the proximal, mid and distal vessel. The proximal and mid vessel has diffuse 30% stenosis. The distal vessel has long 80%, hazy stenosis. The PDA and posterolateral branch has mild plaque.  Left Ventricular Angiogram: LVEF=65%    Treatments: See Hospital Course  Discharge Exam: Blood pressure 151/90, pulse 61, temperature 98.1 F (36.7 C), temperature source Oral, resp. rate 20, height 6\' 1"  (1.854 m), weight 204 lb 5.1 oz (92.678 kg), SpO2 97.00%.   Disposition: 01-Home or Self Care      Discharge Orders   Future Appointments Provider Department Dept Phone  10/18/2013 2:00 PM [facility] [phone]   10/18/2013 3:30 PM Rexene Alberts, MD [facility]  [phone]   Future Orders Complete By Expires   Diet - low sodium heart healthy  As directed    Increase activity slowly  As directed        Medication List         acetaminophen 500 MG tablet  Commonly known as:  TYLENOL  Take 1,000 mg by mouth every 6 (six) hours as needed. For pain     allopurinol 300 MG tablet  Commonly known as:  ZYLOPRIM  Take 300 mg by mouth daily.     aspirin EC 81 MG tablet  Take 81 mg by mouth daily.     Fish Oil 1200 MG Caps  Take 1 capsule by mouth daily.     hydrochlorothiazide 50 MG tablet  Commonly known as:  HYDRODIURIL  Take 50 mg by mouth daily.     meloxicam 7.5 MG tablet  Commonly known as:  MOBIC  Take 7.5 mg by mouth at bedtime.     metoprolol 50 MG tablet  Commonly known as:  LOPRESSOR  Take 25 mg by mouth daily.     multivitamin with minerals Tabs tablet  Take 1 tablet by mouth daily.     nitroGLYCERIN 0.4 MG SL tablet  Commonly known as:  NITROSTAT  Place 1 tablet (0.4 mg total) under the tongue every 5 (five) minutes as needed for chest pain.     omeprazole 20 MG capsule  Commonly known as:  PRILOSEC  Take 20 mg by mouth daily.     OSTEO BI-FLEX REGULAR STRENGTH PO  Take 1 tablet by mouth daily.     potassium chloride 10 MEQ tablet  Commonly known as:  K-DUR,KLOR-CON  Take 10 mEq by mouth daily.     pravastatin 40 MG tablet  Commonly known as:  PRAVACHOL  Take 40 mg by mouth daily.     triamcinolone cream 0.1 %  Commonly known as:  KENALOG  Apply 1 application topically 2 (two) times daily.       TIME SPENT ON DISCHARGE, INCLUDING PHYSICIAN TIME: >30 MINUTES  Signed: Lyda Jester 10/10/2013, 11:49 AM   Patient seen and examined.  Plan as discussed in my rounding note for today and outlined above. Jeneen Rinks [facility]  10/10/2013  6:05 PM

## 2013-10-10 NOTE — Discharge Instructions (Signed)
Coronary Artery Disease Coronary artery disease (CAD) is a process in which the heart (coronary) arteries narrow or become blocked from the development of atherosclerosis. Atherosclerosis is a disease in which plaque builds up on the inside of the heart arteries (coronary arteries). Plaque is made up of fats (lipids), cholesterol, calcium, and fibrous tissue. CAD can lead to a heart attack (myocardial infarction, MI). An MI can lead to heart failure, cardiogenic shock, or sudden cardiac death. CAD can cause an MI through:  Plaque buildup that can severely narrow or block the coronary arteries and diminish blood flow.  Plaque that can become unstable and "rupture." Unstable plaque that ruptures within a coronary artery can form a clot and cause a sudden (acute) blockage. RISK FACTORS Many risk factors contribute to the development of CAD. These include:  High cholesterol (dyslipidemia) levels.  High blood pressure (hypertension).  Smoking.  Diabetes.  Age.  Gender. Men can develop CAD earlier in life than women.  Family history.  Inactivity or lack of regular physical or aerobic exercise.  A diet high in saturated fats.  Chronic kidney disease. SYMPTOMS  When a coronary artery is narrowed or blocked, an MI can occur. MI symptoms can include:  Chest pain (agina). Angina can occur by itself or it can also occur with pain in the neck, arm, jaw, or in the upper, middle back (mid-scapular pain).  Profuse sweating (diaphoresis) without physical activity or movement.  Shortness of breath (dyspnea).  Irregular heartbeats (palpitations) that feel like your heart is skipping beats or is beating very fast.  Nausea.  Epigastric pain. Epigastric pain may occur as "heartburn."  Tiredness (malaise). This can especially be present in the elderly. Women can have different (atypical) symptoms other than classic angina.  DIAGNOSIS  The diagnosis of CAD may include:  An electrocardiography  (ECG). An ECG does not diagnose CAD, but it is usefull in the detection of a sudden (acute) MI or as a marker for a previous MI. Depending on which heart (coronary) artery may be blocked, an ECG may not pick up an MI pattern.  Exercise stress test. A stress test can be performed at rest for people who are unable to do an exercise stress test. A stress test will only be abnormal if one or more of the large coronary arteries is significantly blocked.  Blood tests. Tests may include samples to detect heart muscle damage (such as troponin levels). Other tests may include cholesterol checks and an inflammation test (high-sensitivity C-reactive protein, hs-CRP).  Coronary angiography.  Screening people who have peripheral vascular disease (PAD). These people often times have CAD. TREATMENT  The treatment of CAD includes the following:  Lifestyle changes such as:  Following a heart-healthy diet. A registered dietitian can you help educate you on healthy food options and changes.  Quiting smoking.  Following an exercise program approved by your caregiver.  Maintaining a healthy weight. Lose weight as approved by your caregiver.  Medicines to help control your blood pressure, cholesterol level, angina, and blood clotting. Medicines may include beta-blockers, ACE inhibitors, statins, nitrates, and anti-platelet medicines.  If you have a heart stent and are taking anti-platelet medicine, it is important to not suddenly stop taking this medicine. Suddenly stopping anti-platelet medicine can result in an MI. Talk with your caregiver before stopping medicine or if you cannot afford your medicine.  If the coronary arteries are significantly blocked, surgery may be needed. This can include:  Percutaneous coronary intervention (PCI) with or without stent placement.  Coronary artery bypass graft surgery (CABG). SEEK IMMEDIATE MEDICAL CARE IF:   You develop MI symptoms. This is a medical emergency. Get  help at once. Call your local emergency service (911 in the [place] immediately. Do not drive yourself to the clinic or hospital. MI symptoms can include:  Angina or pain that occurs in the neck, arm, jaw, or in the upper middle back.  Profuse sweating without cause.  Shortness of breath or difficulty breathing without cause.  Unexplained nausea or epigastric pain that feels like heartburn. Document Released: 10/25/2011 Document Reviewed: 10/25/2011 [employer] Patient Information 2014 [employer].  

## 2013-10-10 NOTE — Progress Notes (Signed)
SUBJECTIVE:  No chest pain.  Anxious to go home.    PHYSICAL EXAM Filed Vitals:   10/09/13 1430 10/09/13 1500 10/09/13 2015 10/10/13 0424  BP: 128/75 132/85 160/95 151/90  Pulse: 51 53 61 61  Temp:   98.3 F (36.8 C) 98.1 F (36.7 C)  TempSrc:   Oral Oral  Resp:   20 20  Height:      Weight:    204 lb 5.1 oz (92.678 kg)  SpO2: 97% 95% 95% 97%   General:  No distress Lungs:  Clear Heart:  RRR Abdomen:  Positive bowel sounds, no rebound no guarding Extremities:  Right wrist without bruising or bleeding.   LABS: No results found for this basename: TROPONINI   Results for orders placed during the hospital encounter of 10/09/13 (from the past 24 hour(s))  BASIC METABOLIC PANEL     Status: Abnormal   Collection Time    10/09/13  9:58 AM      Result Value Ref Range   Sodium 140  137 - 147 mEq/L   Potassium 3.9  3.7 - 5.3 mEq/L   Chloride 99  96 - 112 mEq/L   CO2 25  19 - 32 mEq/L   Glucose, Bld 108 (*) 70 - 99 mg/dL   BUN 19  6 - 23 mg/dL   Creatinine, Ser 0.75  0.50 - 1.35 mg/dL   Calcium 9.5  8.4 - 10.5 mg/dL   GFR calc non Af Amer >90  >90 mL/min   GFR calc Af Amer >90  >90 mL/min  CBC     Status: None   Collection Time    10/09/13  9:58 AM      Result Value Ref Range   WBC 7.1  4.0 - 10.5 K/uL   RBC 5.30  4.22 - 5.81 MIL/uL   Hemoglobin 16.9  13.0 - 17.0 g/dL   HCT 48.1  39.0 - 52.0 %   MCV 90.8  78.0 - 100.0 fL   MCH 31.9  26.0 - 34.0 pg   MCHC 35.1  30.0 - 36.0 g/dL   RDW 13.2  11.5 - 15.5 %   Platelets 215  150 - 400 K/uL  PROTIME-INR     Status: None   Collection Time    10/09/13  9:58 AM      Result Value Ref Range   Prothrombin Time 12.1  11.6 - 15.2 seconds   INR 0.91  0.00 - 1.49  URINALYSIS, ROUTINE W REFLEX MICROSCOPIC     Status: Abnormal   Collection Time    10/09/13  8:01 PM      Result Value Ref Range   Color, Urine YELLOW  YELLOW   APPearance CLEAR  CLEAR   Specific Gravity, Urine 1.043 (*) 1.005 - 1.030   pH 6.5  5.0 - 8.0   Glucose, UA NEGATIVE  NEGATIVE mg/dL   Hgb urine dipstick NEGATIVE  NEGATIVE   Bilirubin Urine NEGATIVE  NEGATIVE   Ketones, ur NEGATIVE  NEGATIVE mg/dL   Protein, ur NEGATIVE  NEGATIVE mg/dL   Urobilinogen, UA 1.0  0.0 - 1.0 mg/dL   Nitrite NEGATIVE  NEGATIVE   Leukocytes, UA SMALL (*) NEGATIVE  URINE MICROSCOPIC-ADD ON     Status: None   Collection Time    10/09/13  8:01 PM      Result Value Ref Range   Squamous Epithelial / LPF RARE  RARE   WBC, UA 0-2  <3 WBC/hpf   RBC / HPF  0-2  <3 RBC/hpf   Bacteria, UA RARE  RARE   Urine-Other AMORPHOUS URATES/PHOSPHATES      Intake/Output Summary (Last 24 hours) at 10/10/13 0830 Last data filed at 10/10/13 0700  Gross per 24 hour  Intake    600 ml  Output      0 ml  Net    600 ml     ASSESSMENT AND PLAN:  CAD:  3 vessel CAD on cath.  Surgery is schedule for 3/6.  Home today.  Give a prescription for SLNTG.  Otherwise home on meds as on MAR.   HTN:  BP mildly elevated.  This will be addressed further as an outpatient and with his upcoming surgery.   HYPERLIPIDEMIA:  I don't see a recent statin.  I will defer to Dr. Domenic Polite.     Jeneen Rinks [facility] 10/10/2013 8:30 AM

## 2013-10-10 NOTE — Progress Notes (Signed)
Pre-op Cardiac Surgery  Carotid Findings:  Bilateral:  1-39% ICA stenosis.  Vertebral artery flow is antegrade.      Upper Extremity Right Left  Brachial Pressures 141 150  Radial Waveforms Tri Tri  Ulnar Waveforms Tri Tri  Palmar Arch (Allen's Test) Obliterates with radial compression, normal with ulnar compression Normal .   Findings:   Palpable pedal pulses.   Landry Mellow, RDMS, RVT 10/10/2013

## 2013-10-18 ENCOUNTER — Encounter (HOSPITAL_COMMUNITY)
Admission: RE | Admit: 2013-10-18 | Discharge: 2013-10-18 | Disposition: A | Discharge status: Home / Self Care | Payer: Medicare Other | Source: Ambulatory Visit | Attending: Thoracic Surgery (Cardiothoracic Vascular Surgery) | Admitting: Thoracic Surgery (Cardiothoracic Vascular Surgery)

## 2013-10-18 ENCOUNTER — Ambulatory Visit (INDEPENDENT_AMBULATORY_CARE_PROVIDER_SITE_OTHER): Payer: Medicare Other | Admitting: Thoracic Surgery (Cardiothoracic Vascular Surgery)

## 2013-10-18 ENCOUNTER — Encounter: Payer: Self-pay | Admitting: Thoracic Surgery (Cardiothoracic Vascular Surgery)

## 2013-10-18 ENCOUNTER — Encounter (HOSPITAL_COMMUNITY): Payer: Self-pay

## 2013-10-18 ENCOUNTER — Encounter (HOSPITAL_COMMUNITY): Payer: Self-pay | Admitting: Thoracic Surgery (Cardiothoracic Vascular Surgery)

## 2013-10-18 VITALS — BP 140/82 | HR 80 | Resp 20 | Ht 73.0 in | Wt 206.0 lb

## 2013-10-18 VITALS — BP 131/83 | HR 65 | Temp 98.2°F | Resp 20 | Ht 73.0 in | Wt 206.8 lb

## 2013-10-18 DIAGNOSIS — Z01812 Encounter for preprocedural laboratory examination: Secondary | ICD-10-CM

## 2013-10-18 DIAGNOSIS — I498 Other specified cardiac arrhythmias: Secondary | ICD-10-CM | POA: Diagnosis present

## 2013-10-18 DIAGNOSIS — Z96659 Presence of unspecified artificial knee joint: Secondary | ICD-10-CM | POA: Diagnosis not present

## 2013-10-18 DIAGNOSIS — Z01810 Encounter for preprocedural cardiovascular examination: Secondary | ICD-10-CM

## 2013-10-18 DIAGNOSIS — R918 Other nonspecific abnormal finding of lung field: Secondary | ICD-10-CM | POA: Diagnosis not present

## 2013-10-18 DIAGNOSIS — F17200 Nicotine dependence, unspecified, uncomplicated: Secondary | ICD-10-CM | POA: Diagnosis present

## 2013-10-18 DIAGNOSIS — J90 Pleural effusion, not elsewhere classified: Secondary | ICD-10-CM | POA: Diagnosis not present

## 2013-10-18 DIAGNOSIS — I2510 Atherosclerotic heart disease of native coronary artery without angina pectoris: Secondary | ICD-10-CM | POA: Insufficient documentation

## 2013-10-18 DIAGNOSIS — I208 Other forms of angina pectoris: Secondary | ICD-10-CM

## 2013-10-18 DIAGNOSIS — I10 Essential (primary) hypertension: Secondary | ICD-10-CM | POA: Diagnosis not present

## 2013-10-18 DIAGNOSIS — Z79899 Other long term (current) drug therapy: Secondary | ICD-10-CM | POA: Diagnosis not present

## 2013-10-18 DIAGNOSIS — I200 Unstable angina: Secondary | ICD-10-CM | POA: Diagnosis present

## 2013-10-18 DIAGNOSIS — D62 Acute posthemorrhagic anemia: Secondary | ICD-10-CM | POA: Diagnosis not present

## 2013-10-18 DIAGNOSIS — E782 Mixed hyperlipidemia: Secondary | ICD-10-CM | POA: Diagnosis present

## 2013-10-18 DIAGNOSIS — D696 Thrombocytopenia, unspecified: Secondary | ICD-10-CM | POA: Diagnosis not present

## 2013-10-18 DIAGNOSIS — J9819 Other pulmonary collapse: Secondary | ICD-10-CM | POA: Diagnosis not present

## 2013-10-18 DIAGNOSIS — M51379 Other intervertebral disc degeneration, lumbosacral region without mention of lumbar back pain or lower extremity pain: Secondary | ICD-10-CM | POA: Diagnosis present

## 2013-10-18 DIAGNOSIS — Z4682 Encounter for fitting and adjustment of non-vascular catheter: Secondary | ICD-10-CM | POA: Diagnosis not present

## 2013-10-18 DIAGNOSIS — K219 Gastro-esophageal reflux disease without esophagitis: Secondary | ICD-10-CM | POA: Diagnosis not present

## 2013-10-18 DIAGNOSIS — E8779 Other fluid overload: Secondary | ICD-10-CM | POA: Diagnosis not present

## 2013-10-18 DIAGNOSIS — I4891 Unspecified atrial fibrillation: Secondary | ICD-10-CM | POA: Diagnosis not present

## 2013-10-18 DIAGNOSIS — I209 Angina pectoris, unspecified: Secondary | ICD-10-CM | POA: Diagnosis not present

## 2013-10-18 DIAGNOSIS — H9190 Unspecified hearing loss, unspecified ear: Secondary | ICD-10-CM | POA: Diagnosis present

## 2013-10-18 DIAGNOSIS — Z8249 Family history of ischemic heart disease and other diseases of the circulatory system: Secondary | ICD-10-CM | POA: Diagnosis not present

## 2013-10-18 DIAGNOSIS — M109 Gout, unspecified: Secondary | ICD-10-CM | POA: Diagnosis present

## 2013-10-18 DIAGNOSIS — I2584 Coronary atherosclerosis due to calcified coronary lesion: Secondary | ICD-10-CM | POA: Diagnosis present

## 2013-10-18 DIAGNOSIS — Z452 Encounter for adjustment and management of vascular access device: Secondary | ICD-10-CM | POA: Diagnosis not present

## 2013-10-18 DIAGNOSIS — R7309 Other abnormal glucose: Secondary | ICD-10-CM | POA: Diagnosis present

## 2013-10-18 DIAGNOSIS — Z7982 Long term (current) use of aspirin: Secondary | ICD-10-CM | POA: Diagnosis not present

## 2013-10-18 DIAGNOSIS — M5137 Other intervertebral disc degeneration, lumbosacral region: Secondary | ICD-10-CM | POA: Diagnosis not present

## 2013-10-18 DIAGNOSIS — Z801 Family history of malignant neoplasm of trachea, bronchus and lung: Secondary | ICD-10-CM | POA: Diagnosis not present

## 2013-10-18 DIAGNOSIS — M129 Arthropathy, unspecified: Secondary | ICD-10-CM | POA: Diagnosis present

## 2013-10-18 HISTORY — DX: Unspecified cataract: H26.9

## 2013-10-18 HISTORY — DX: Unspecified hemorrhoids: K64.9

## 2013-10-18 HISTORY — DX: Personal history of other diseases of the musculoskeletal system and connective tissue: Z87.39

## 2013-10-18 HISTORY — DX: Unspecified hearing loss, unspecified ear: H91.90

## 2013-10-18 LAB — SURGICAL PCR SCREEN
MRSA, PCR: NEGATIVE
Staphylococcus aureus: POSITIVE — AB

## 2013-10-18 LAB — BLOOD GAS, ARTERIAL
Acid-Base Excess: 1 mmol/L (ref 0.0–2.0)
Bicarbonate: 24.9 mEq/L — ABNORMAL HIGH (ref 20.0–24.0)
Drawn by: 206361
FIO2: 0.21 %
O2 Saturation: 97.1 %
Patient temperature: 98.6
TCO2: 26.1 mmol/L (ref 0–100)
pCO2 arterial: 38.4 mmHg (ref 35.0–45.0)
pH, Arterial: 7.428 (ref 7.350–7.450)
pO2, Arterial: 86.1 mmHg (ref 80.0–100.0)

## 2013-10-18 LAB — COMPREHENSIVE METABOLIC PANEL
ALT: 24 U/L (ref 0–53)
AST: 29 U/L (ref 0–37)
Albumin: 4 g/dL (ref 3.5–5.2)
Alkaline Phosphatase: 68 U/L (ref 39–117)
BUN: 19 mg/dL (ref 6–23)
CO2: 21 mEq/L (ref 19–32)
Calcium: 9.4 mg/dL (ref 8.4–10.5)
Chloride: 98 mEq/L (ref 96–112)
Creatinine, Ser: 1 mg/dL (ref 0.50–1.35)
GFR calc Af Amer: 88 mL/min — ABNORMAL LOW (ref >90)
GFR calc non Af Amer: 76 mL/min — ABNORMAL LOW (ref >90)
Glucose, Bld: 129 mg/dL — ABNORMAL HIGH (ref 70–99)
Potassium: 3.5 mEq/L — ABNORMAL LOW (ref 3.7–5.3)
Sodium: 136 mEq/L — ABNORMAL LOW (ref 137–147)
Total Bilirubin: 0.5 mg/dL (ref 0.3–1.2)
Total Protein: 7.1 g/dL (ref 6.0–8.3)

## 2013-10-18 LAB — CBC
HCT: 43.6 % (ref 39.0–52.0)
Hemoglobin: 15.6 g/dL (ref 13.0–17.0)
MCH: 32.2 pg (ref 26.0–34.0)
MCHC: 35.8 g/dL (ref 30.0–36.0)
MCV: 90.1 fL (ref 78.0–100.0)
Platelets: 196 10*3/uL (ref 150–400)
RBC: 4.84 MIL/uL (ref 4.22–5.81)
RDW: 13.1 % (ref 11.5–15.5)
WBC: 9.5 10*3/uL (ref 4.0–10.5)

## 2013-10-18 LAB — ABO/RH: ABO/RH(D): O POS

## 2013-10-18 LAB — URINALYSIS, ROUTINE W REFLEX MICROSCOPIC
Bilirubin Urine: NEGATIVE
Glucose, UA: NEGATIVE mg/dL
Hgb urine dipstick: NEGATIVE
Ketones, ur: NEGATIVE mg/dL
Nitrite: NEGATIVE
Protein, ur: NEGATIVE mg/dL
Specific Gravity, Urine: 1.025 (ref 1.005–1.030)
Urobilinogen, UA: 0.2 mg/dL (ref 0.0–1.0)
pH: 5.5 (ref 5.0–8.0)

## 2013-10-18 LAB — HEMOGLOBIN A1C
Hgb A1c MFr Bld: 5.9 % — ABNORMAL HIGH (ref <5.7)
Mean Plasma Glucose: 123 mg/dL — ABNORMAL HIGH (ref <117)

## 2013-10-18 LAB — PROTIME-INR
INR: 0.85 (ref 0.00–1.49)
Prothrombin Time: 11.5 seconds — ABNORMAL LOW (ref 11.6–15.2)

## 2013-10-18 LAB — TYPE AND SCREEN
ABO/RH(D): O POS
Antibody Screen: NEGATIVE

## 2013-10-18 LAB — APTT: aPTT: 31 seconds (ref 24–37)

## 2013-10-18 LAB — URINE MICROSCOPIC-ADD ON

## 2013-10-18 MED ORDER — VANCOMYCIN HCL 1000 MG IV SOLR
INTRAVENOUS | Status: AC
  Administered 2013-10-19: 09:00:00
  Filled 2013-10-18: qty 1000

## 2013-10-18 MED ORDER — DEXMEDETOMIDINE HCL IN NACL 400 MCG/100ML IV SOLN
0.1000 ug/kg/h | INTRAVENOUS | Status: DC
  Filled 2013-10-18: qty 100

## 2013-10-18 MED ORDER — SODIUM CHLORIDE 0.9 % IV SOLN
INTRAVENOUS | Status: AC
  Administered 2013-10-19: 1 [IU]/h via INTRAVENOUS
  Filled 2013-10-18: qty 1

## 2013-10-18 MED ORDER — DEXTROSE 5 % IV SOLN
30.0000 ug/min | INTRAVENOUS | Status: DC
  Filled 2013-10-18: qty 2

## 2013-10-18 MED ORDER — SODIUM CHLORIDE 0.9 % IV SOLN
INTRAVENOUS | Status: AC
  Administered 2013-10-19: 70 mL/h via INTRAVENOUS
  Filled 2013-10-18: qty 40

## 2013-10-18 MED ORDER — EPINEPHRINE HCL 1 MG/ML IJ SOLN
0.5000 ug/min | INTRAVENOUS | Status: DC
  Filled 2013-10-18: qty 4

## 2013-10-18 MED ORDER — SODIUM CHLORIDE 0.9 % IV SOLN
1500.0000 mg | INTRAVENOUS | Status: AC
  Administered 2013-10-19: 1500 mg via INTRAVENOUS
  Filled 2013-10-18 (×2): qty 1500

## 2013-10-18 MED ORDER — MAGNESIUM SULFATE 50 % IJ SOLN
40.0000 meq | INTRAMUSCULAR | Status: DC
  Filled 2013-10-18: qty 10

## 2013-10-18 MED ORDER — DOPAMINE-DEXTROSE 3.2-5 MG/ML-% IV SOLN
2.0000 ug/kg/min | INTRAVENOUS | Status: DC
  Filled 2013-10-18: qty 250

## 2013-10-18 MED ORDER — DOPAMINE-DEXTROSE 3.2-5 MG/ML-% IV SOLN
2.0000 ug/kg/min | INTRAVENOUS | Status: DC
Start: 2013-10-19 — End: 2013-10-18

## 2013-10-18 MED ORDER — PAPAVERINE HCL 30 MG/ML IJ SOLN
INTRAMUSCULAR | Status: AC
  Administered 2013-10-19: 09:00:00
  Filled 2013-10-18: qty 2.5

## 2013-10-18 MED ORDER — NITROGLYCERIN IN D5W 200-5 MCG/ML-% IV SOLN
2.0000 ug/min | INTRAVENOUS | Status: AC
  Administered 2013-10-19: 10 ug/min via INTRAVENOUS
  Filled 2013-10-18: qty 250

## 2013-10-18 MED ORDER — DEXMEDETOMIDINE HCL IN NACL 400 MCG/100ML IV SOLN: 0.1000 ug/kg/h | INTRAVENOUS | Status: DC

## 2013-10-18 MED ORDER — DEXTROSE 5 % IV SOLN
1.5000 g | INTRAVENOUS | Status: AC
  Administered 2013-10-19: 1.5 g via INTRAVENOUS
  Administered 2013-10-19: .75 g via INTRAVENOUS
  Filled 2013-10-18 (×2): qty 1.5

## 2013-10-18 MED ORDER — CEFUROXIME SODIUM 750 MG IJ SOLR
750.0000 mg | INTRAMUSCULAR | Status: DC
  Filled 2013-10-18: qty 750

## 2013-10-18 MED ORDER — POTASSIUM CHLORIDE 2 MEQ/ML IV SOLN
80.0000 meq | INTRAVENOUS | Status: DC
  Filled 2013-10-18: qty 40

## 2013-10-18 MED ORDER — SODIUM CHLORIDE 0.9 % IV SOLN
INTRAVENOUS | Status: DC
  Filled 2013-10-18: qty 30

## 2013-10-18 MED ORDER — CHLORHEXIDINE GLUCONATE 4 % EX LIQD: 30.0000 mL | CUTANEOUS | Status: DC

## 2013-10-18 NOTE — Progress Notes (Addendum)
Cardiologist is Dr.McDowell   Echo report in epic from 2015  Stress test reports in epic from 2002 and 2015  Heart cath report in epic from 2015  PFT and Dopplers in epic from 10-10-13  CXR in epic from 10-09-13  Medical Md is Dr.Edward Luan Pulling

## 2013-10-18 NOTE — Progress Notes (Signed)
[address]       [place] [address]             [phone]     CARDIOTHORACIC SURGERY OFFICE NOTE  Referring Provider is Lauree Chandler D*MD PCP is HAWKINS,EDWARD L, MD   HPI:  Patient returns for followup of severe three-vessel coronary artery disease with exertional angina. He was originally seen in consultation following his cardiac catheterization on 10/09/2013. He returns to the office today with plans to proceed with elective coronary artery bypass grafting tomorrow. He reports that over the past week he has done well. He has not had any symptoms of chest discomfort whatsoever. [name]. He has not had any fevers chills or productive cough. He feels well and is eager to proceed with surgery.   Current Outpatient Prescriptions  Medication Sig Dispense Refill  . acetaminophen (TYLENOL) 500 MG tablet Take 1,000 mg by mouth every 6 (six) hours as needed. For pain      . allopurinol (ZYLOPRIM) 300 MG tablet Take 300 mg by mouth daily.      Marland Kitchen aspirin EC 81 MG tablet Take 81 mg by mouth daily.      . Glucosamine-Chondroitin (OSTEO BI-FLEX REGULAR STRENGTH PO) Take 1 tablet by mouth daily.      . hydrochlorothiazide (HYDRODIURIL) 50 MG tablet Take 50 mg by mouth daily.      . meloxicam (MOBIC) 7.5 MG tablet Take 7.5 mg by mouth at bedtime.      . metoprolol (LOPRESSOR) 50 MG tablet Take 25 mg by mouth daily.      . Multiple Vitamin (MULITIVITAMIN WITH MINERALS) TABS Take 1 tablet by mouth daily.      . nitroGLYCERIN (NITROSTAT) 0.4 MG SL tablet Place 1 tablet (0.4 mg total) under the tongue every 5 (five) minutes as needed for chest pain.  25 tablet  2  . Omega-3 Fatty Acids (FISH OIL) 1200 MG CAPS Take 1 capsule by mouth daily.      Marland Kitchen omeprazole (PRILOSEC) 20 MG capsule Take 20 mg by mouth daily.      . potassium chloride (K-DUR,KLOR-CON) 10 MEQ tablet Take 10 mEq by mouth daily.      . pravastatin (PRAVACHOL) 40 MG tablet Take  40 mg by mouth daily.      Marland Kitchen triamcinolone cream (KENALOG) 0.1 % Apply 1 application topically 2 (two) times daily.       No current facility-administered medications for this visit.   Facility-Administered Medications Ordered in Other Visits  Medication Dose Route Frequency Provider Last Rate Last Dose  . [START ON 10/19/2013] aminocaproic acid (AMICAR) 10 g in sodium chloride 0.9 % 100 mL infusion   Intravenous To OR Lauren Bajbus, RPH      . [START ON 10/19/2013] cefUROXime (ZINACEF) 1.5 g in dextrose 5 % 50 mL IVPB  1.5 g Intravenous To OR Lauren Bajbus, RPH      . [START ON 10/19/2013] cefUROXime (ZINACEF) 750 mg in dextrose 5 % 50 mL IVPB  750 mg Intravenous To OR Lauren Bajbus, RPH      . chlorhexidine (HIBICLENS) 4 % liquid 2 application  30 mL Topical UD Rexene Alberts, MD      . Derrill Memo ON 10/19/2013] dexmedetomidine (PRECEDEX) 400 MCG/100ML infusion  0.1-0.7 mcg/kg/hr Intravenous To OR Rexene Alberts, MD      . Derrill Memo ON 10/19/2013] DOPamine (INTROPIN) 800 mg in dextrose 5 % 250 mL infusion  2-20 mcg/kg/min Intravenous To OR Rexene Alberts, MD      . Derrill Memo ON 10/19/2013] EPINEPHrine (ADRENALIN) 4,000 mcg in dextrose 5 % 250 mL infusion  0.5-20 mcg/min Intravenous To OR Lauren Bajbus, RPH      . [START ON 10/19/2013] heparin 2,500 Units, papaverine 30 mg in electrolyte-148 (PLASMALYTE-148) 500 mL irrigation   Irrigation To OR Lauren Bajbus, RPH      . [START ON 10/19/2013] heparin 30,000 units/NS 1000 mL solution for CELLSAVER   Other To OR Lauren Bajbus, RPH      . [START ON 10/19/2013] insulin regular (NOVOLIN R,HUMULIN R) 1 Units/mL in sodium chloride 0.9 % 100 mL infusion   Intravenous To OR Lauren Bajbus, RPH      . [START ON 10/19/2013] magnesium sulfate (IV Push/IM) injection 40 mEq  40 mEq Other To OR Lauren Bajbus, RPH      . [START ON 10/19/2013] nitroGLYCERIN 0.2 mg/mL in dextrose 5 % infusion  2-200 mcg/min Intravenous To OR Lauren Bajbus, RPH      . [START ON 10/19/2013] phenylephrine  (NEO-SYNEPHRINE) 20 mg in dextrose 5 % 250 mL infusion  30-200 mcg/min Intravenous To OR Lauren Bajbus, RPH      . [START ON 10/19/2013] potassium chloride injection 80 mEq  80 mEq Other To OR Lauren Bajbus, RPH      . [START ON 10/19/2013] vancomycin (VANCOCIN) 1,000 mg in sodium chloride 0.9 % 1,000 mL irrigation   Irrigation To OR Lauren Bajbus, RPH      . [START ON 10/19/2013] vancomycin (VANCOCIN) 1,500 mg in sodium chloride 0.9 % 250 mL IVPB  1,500 mg Intravenous To OR Lauren Bajbus, RPH          Physical Exam:   BP 140/82  Pulse 80  Resp 20  Ht 6\' 1"  (1.854 m)  Wt 206 lb (93.441 kg)  BMI 27.18 kg/m2  SpO2 97%  General:  Well-appearing  Chest:   Clear to auscultation  CV:   Regular rate and rhythm  Incisions:  n/a  Abdomen:  Soft and nontender  Extremities:  Warm and well-perfused  Diagnostic Tests:  n/a   Impression:  The patient has severe three-vessel coronary artery disease including complex ostial stenosis of the left anterior descending coronary artery with coronary anatomy relatively unfavorable for percutaneous coronary intervention. Left ventricular systolic function is preserved although the patient does have severe left ventricular hypertrophy and likely significant diastolic dysfunction. He presents with stable symptoms of exertional angina.    Plan:  I have again reviewed the indications, risks, and potential benefits of coronary artery bypass grafting with the patient and his wife. Alternative treatment strategies have been discussed. The patient understands and accepts all potential associated risks of surgery including but not limited to risk of death, stroke or other neurologic complication, myocardial infarction, congestive heart failure, respiratory failure, renal failure, bleeding requiring blood transfusion and/or reexploration, aortic dissection or other major vascular complication, arrhythmia, heart block or bradycardia requiring permanent pacemaker,  pneumonia, pleural effusion, wound infection, pulmonary embolus or other thromboembolic complication, chronic pain or other delayed complications related to median sternotomy, or the late recurrence of symptomatic ischemic heart disease and/or congestive heart failure. The importance of long term risk modification have been emphasized. All questions answered. We tentatively plan for surgery tomorrow morning.  I spent in excess of 40 minutes during the conduct of this office consultation and >50% of this time involved direct face-to-face encounter with the patient for counseling and/or coordination of  their care.   Valentina Gu. Roxy Manns, MD 10/18/2013 3:47 PM

## 2013-10-18 NOTE — Progress Notes (Signed)
Took a Nitroglycerin 1tab while doing stress test

## 2013-10-18 NOTE — H&P (Signed)
[address]       [place] [address]             [phone]          CARDIOTHORACIC SURGERY HISTORY AND PHYSICAL EXAM  PCP is HAWKINS,EDWARD L, MD Referring Provider is Burnell Blanks, MD Primary Cardiologist is MCDOWELL, Aloha Gell, MD   Reason for consultation:  Severe 3-vessel CAD  HPI:  Patient is a 68 year old white male from [place] with no previous history of coronary artery disease but risk factors notable for history of hypertension, hyperlipidemia, and borderline type 2 diabetes mellitus. The patient describes a six-month history of exertional chest discomfort consistent with classic stable angina pectoris. Approximate 6 months ago he noticed that while walking at a brisk pace or up an incline he would develop substernal chest pressure radiating across his chest. Oftentimes this would be associated with a feeling that he needed to belch. Symptoms always resolve with stopping to rest. Over the past 6 months the symptoms have remained essentially stable, and he denies any increase in frequency or severity. He has never had any chest discomfort at rest or at night. He underwent an echocardiogram demonstrating the presence of severe left ventricular hypertrophy with normal left ventricular systolic function, ejection fraction estimated 60-65%. There was no evidence for SAM or dynamic left ventricular outflow tract obstruction. Nuclear stress Cardiolite exam was performed demonstrating a large area of reversible ischemia, and the patient had both symptoms and ST segment changes on EKG during peak stress. He subsequently underwent elective diagnostic cardiac catheterization by Dr. Angelena Form. This demonstrates severe three-vessel coronary artery disease with coronary anatomy relatively unfavorable for percutaneous coronary intervention.  The patient was referred for surgical consultation.  He was originally seen in consultation following his cardiac  catheterization on 10/09/2013. He returns to the office today with plans to proceed with elective coronary artery bypass grafting tomorrow.   The patient lives with his wife and works full-time in a Systems developer. This requires fairly strenuous physical activity. He remains physically active otherwise and has not had other significant physical limitations.  He describes a six-month history of exertional chest pain as noted. He denies any history of nocturnal chest pain or shortness of breath. He denies any episodes of chest pain at rest. He denies any history of exertional shortness of breath, PND, orthopnea, or lower extremity edema. He has had some dizzy spells and near syncope in the remote past, but nothing recently.   Past Medical History  Diagnosis Date  . Arthritis   . DDD (degenerative disc disease), lumbar     Dr. Carloyn Manner  . Mixed hyperlipidemia     takes Pravastatin daily  . Lumbago   . History of gout     takes Allopurinol daily  . GERD (gastroesophageal reflux disease)     takes Omeprazole daily  . Essential hypertension, benign     takes Metoprolol and HCTZ daily  . Coronary artery disease   . Back pain     DDD and takes Mobic nightly  . Joint pain   . [facility] spotted fever     Dec 2014-uses creme as needed  . Hemorrhoids   . Cataract     right but immature  . Hard of hearing     but doesn't wear hearing aids  . Diabetes mellitus type 2, diet-controlled     "borderline"    Past Surgical History  Procedure Laterality Date  .  Total knee arthroplasty Left   . Bicept tenodesis  11/26/2011    Procedure: BICEPT TENODESIS;  Surgeon: Augustin Schooling, MD;  Location: [place];  Service: Orthopedics;  Laterality: Left;  . Left shoulder rotator cuff repair    . Lithotripsy    . Cataract surgery Left   . Vasectomy    . Colonoscopy      Family History  Problem Relation Age of Onset  . Lung cancer Father   . Heart failure Mother   . Hypertension Sister     Social  History History  Substance Use Topics  . Smoking status: Heavy Tobacco Smoker -- 2.00 packs/day for 30 years    Types: Cigarettes  . Smokeless tobacco: Former Systems developer    Types: Chew     Comment: quit smoking 77yrs ago  . Alcohol Use: Yes     Comment: Occassional    Prior to Admission medications   Medication Sig Start Date End Date Taking? Authorizing Provider  acetaminophen (TYLENOL) 500 MG tablet Take 1,000 mg by mouth every 6 (six) hours as needed. For pain    Historical Provider, MD  allopurinol (ZYLOPRIM) 300 MG tablet Take 300 mg by mouth daily.    Historical Provider, MD  aspirin EC 81 MG tablet Take 81 mg by mouth daily.    Historical Provider, MD  Glucosamine-Chondroitin (OSTEO BI-FLEX REGULAR STRENGTH PO) Take 1 tablet by mouth daily.    Historical Provider, MD  hydrochlorothiazide (HYDRODIURIL) 50 MG tablet Take 50 mg by mouth daily.    Historical Provider, MD  meloxicam (MOBIC) 7.5 MG tablet Take 7.5 mg by mouth at bedtime.    Historical Provider, MD  metoprolol (LOPRESSOR) 50 MG tablet Take 25 mg by mouth daily.    Historical Provider, MD  Multiple Vitamin (MULITIVITAMIN WITH MINERALS) TABS Take 1 tablet by mouth daily.    Historical Provider, MD  nitroGLYCERIN (NITROSTAT) 0.4 MG SL tablet Place 1 tablet (0.4 mg total) under the tongue every 5 (five) minutes as needed for chest pain. 10/10/13   Brittainy Simmons, PA-C  Omega-3 Fatty Acids (FISH OIL) 1200 MG CAPS Take 1 capsule by mouth daily.    Historical Provider, MD  omeprazole (PRILOSEC) 20 MG capsule Take 20 mg by mouth daily.    Historical Provider, MD  potassium chloride (K-DUR,KLOR-CON) 10 MEQ tablet Take 10 mEq by mouth daily.    Historical Provider, MD  pravastatin (PRAVACHOL) 40 MG tablet Take 40 mg by mouth daily.    Historical Provider, MD  triamcinolone cream (KENALOG) 0.1 % Apply 1 application topically 2 (two) times daily.    Historical Provider, MD    No Known Allergies   Review of Systems:               General:                      normal appetite, normal energy, no weight gain, no weight loss, no fever             Cardiac:                      + chest pain with exertion, no chest pain at rest, no SOB with exertion, no resting SOB, no PND, no orthopnea, no palpitations, no arrhythmia, no atrial fibrillation, no LE edema, occasional dizzy spells, + near syncope in the remote past             Respiratory:  no shortness of breath, no home oxygen, no productive cough, no dry cough, no bronchitis, no wheezing, no hemoptysis, no asthma, no pain with inspiration or cough, no sleep apnea, no CPAP at night             GI:                                no difficulty swallowing, no reflux, no frequent heartburn, no hiatal hernia, no abdominal pain, no constipation, no diarrhea, no hematochezia, no hematemesis, no melena             GU:                              no dysuria,  + frequency, no urinary tract infection, no hematuria, no enlarged prostate, + kidney stones, no kidney disease             Vascular:                     no pain suggestive of claudication, no pain in feet, no leg cramps, no varicose veins, no DVT, no non-healing foot ulcer             Neuro:                         no stroke, no TIA's, no seizures, no headaches, no temporary blindness one eye,  no slurred speech, no peripheral neuropathy, no chronic pain, no instability of gait, no memory/cognitive dysfunction             Musculoskeletal:         + arthritis primarily in lower back, no joint swelling, no myalgias, no difficulty walking, normal mobility               Skin:                            no rash, no itching, no skin infections, no pressure sores or ulcerations             Psych:                         no anxiety, no depression, no nervousness, no unusual recent stress             Eyes:                           no blurry vision, no floaters, no recent vision changes, + wears glasses or contacts             ENT:                             + hearing loss, no loose or painful teeth, no dentures, last saw dentist               Hematologic:               no easy bruising, no abnormal bleeding, no clotting disorder, no frequent epistaxis             Endocrine:                   +  borderline diabetes, does not check CBG's at home                           Physical Exam:              BP 132/85  Pulse 53  Temp(Src) 98.2 F (36.8 C) (Oral)  Resp 20  Ht 6\' 1"  (1.854 m)  Wt 92.987 kg (205 lb)  BMI 27.05 kg/m2  SpO2 95%             General:                        well-appearing             HEENT:                       Unremarkable               Neck:                           no JVD, no bruits, no adenopathy               Chest:                         clear to auscultation, symmetrical breath sounds, no wheezes, no rhonchi               CV:                              RRR, no  murmur               Abdomen:                    soft, non-tender, no masses               Extremities:                 warm, well-perfused, pulses palpable, no lower extremity edema             Rectal/GU                   Deferred             Neuro:                         Grossly non-focal and symmetrical throughout             Skin:                            Clean and dry, no rashes, no breakdown  Diagnostic Tests:  Transthoracic Echocardiography  Patient:    Juelz, Newsum MR #:       [id number] Study Date: 09/19/2013 Gender:     M Age:        60 Height:     185.4cm Weight:     93.4kg BSA:        2.17m^2 Pt. Status: Room:    ATTENDING    Buel Ream, Lisabeth Devoid  SONOGRAPHER  Lina Sar, RDCS  PERFORMING   Chmg, Forestine Na  cc:  ------------------------------------------------------------ LV EF: 60% -   65%  ------------------------------------------------------------ Indications:      Angina - stable  413.9.  ------------------------------------------------------------ History:   Risk factors:  GERD, family history of hypertrophic obstructive cardiomyopathy Hypertension. Dyslipidemia.  ------------------------------------------------------------ Study Conclusions  - Left ventricle: Normal left ventricular outflow tract   gradient. Wall thickness was increased in a pattern of   severe LVH. Systolic function was normal. The estimated   ejection fraction was in the range of 60% to 65%. Wall   motion was normal; there were no regional wall motion   abnormalities. There was an increased relative   contribution of atrial contraction to ventricular filling.   Doppler parameters are consistent with abnormal left   ventricular relaxation (grade 1 diastolic dysfunction).   There was no evidence of elevated ventricular filling   pressure by Doppler parameters. - Aortic valve: Trileaflet; mildly thickened leaflets. There   was no stenosis. - Mitral valve: No systolic motion of the anterior mitral   leaflet was noted. A small torn chord was appreciated,   with no evidence of mitral regurgitation. Calcified   annulus. - Left atrium: The atrium was mildly dilated. Transthoracic echocardiography.  M-mode, complete 2D, spectral Doppler, and color Doppler.  Height:  Height: 185.4cm. Height: 73in.  Weight:  Weight: 93.4kg. Weight: 205.6lb.  Body mass index:  BMI: 27.2kg/m^2.  Body surface area:    BSA: 2.102m^2.  Blood pressure:     160/90.  Patient status:  Outpatient.  Location:  Echo laboratory.  ------------------------------------------------------------  ------------------------------------------------------------ Left ventricle:  Normal left ventricular outflow tract gradient.  Wall thickness was increased in a pattern of severe LVH.   Systolic function was normal. The estimated ejection fraction was in the range of 60% to 65%. Wall motion was normal; there were no regional wall  motion abnormalities. There was an increased relative contribution of atrial contraction to ventricular filling. Doppler parameters are consistent with abnormal left ventricular relaxation (grade 1 diastolic dysfunction). There was no evidence of elevated ventricular filling pressure by Doppler parameters.  ------------------------------------------------------------ Aortic valve:   Trileaflet; mildly thickened leaflets. Doppler:   There was no stenosis.    No regurgitation.  ------------------------------------------------------------ Aorta:  Aortic root: The aortic root was normal in size.  ------------------------------------------------------------ Mitral valve:  No systolic motion of the anterior mitral leaflet was noted. A small torn chord was appreciated with some redundance of the anterior leaflet, with no evidence of mitral regurgitation.  Calcified annulus. Leaflet separation was normal.  Doppler:  Transvalvular velocity was within the normal range. There was no evidence for stenosis.  No regurgitation.  ------------------------------------------------------------ Left atrium:  The atrium was mildly dilated.  ------------------------------------------------------------ Atrial septum:  No defect or patent foramen ovale was identified.  ------------------------------------------------------------ Right ventricle:  The cavity size was normal. Wall thickness was normal. Systolic function was normal.  ------------------------------------------------------------ Pulmonic valve:    The valve appears to be grossly normal.  Doppler:   No significant regurgitation.  ------------------------------------------------------------ Tricuspid valve:   Structurally normal valve.   Leaflet separation was normal.  Doppler:  Transvalvular velocity was within the normal range.  Trivial regurgitation.  ------------------------------------------------------------ Right atrium:  The  atrium was normal in size.  ------------------------------------------------------------ Pericardium:  There was no pericardial effusion.  ------------------------------------------------------------ Systemic veins: Inferior vena cava: The vessel was normal in size; the respirophasic diameter changes were in the normal range (= 50%); findings are consistent with normal central venous pressure.  ------------------------------------------------------------  2D measurements  Normal  Doppler measurements   Normal Left ventricle                 Main pulmonary LVID ED,   34.6 mm     43-52   artery chord,                         Pressure, S    24 mm   =30 PLAX                                             Hg LVID ES,   17.5 mm     23-38   Left ventricle chord,                         Ea, lat ann,  7.9 cm/s ------ PLAX                           tiss DP FS, chord,   49 %      >29     E/Ea, lat     6.5      ------ PLAX                           ann, tiss DP    6 LVPW, ED   18.5 mm     ------  Ea, med ann,  5.2 cm/s ------ IVS/LVPW   1.05        <1.3    tiss DP ratio, ED                      E/Ea, med     9.9      ------ Ventricular septum             ann, tiss DP    6 IVS, ED    19.5 mm     ------  LVOT Aorta                          Peak vel, S   112 cm/s ------ Root diam,   33 mm     ------  VTI, S        26. cm   ------ ED                                             9 Left atrium                    Peak            5 mm   ------ AP dim       43 mm     ------  gradient, S       Hg AP dim     1.97 cm/m^2 <2.2    Mitral valve index                          Peak E vel    51. cm/s ------  8                                Peak A vel    57. cm/s ------                                                4                                Deceleration  216 ms   150-23                                time                   0                                 Peak E/A      0.9      ------                                ratio                                Tricuspid valve                                Regurg peak   229 cm/s ------                                vel                                Peak RV-RA     21 mm   ------                                gradient, S       Hg                                Systemic veins                                Estimated CVP   3 mm   ------                                                  Hg                                Right ventricle  Pressure, S    24 mm   <30                                                  Hg                                Sa vel, lat   10. cm/s ------                                ann, tiss DP    8   ------------------------------------------------------------ Prepared and Electronically Authenticated by  Kate Sable, MD [id number]:[id number]    MYOCARDIAL IMAGING WITH SPECT (REST AND EXERCISE)   GATED LEFT VENTRICULAR WALL MOTION STUDY   LEFT VENTRICULAR EJECTION FRACTION   TECHNIQUE: Standard myocardial SPECT imaging was performed after resting intravenous injection of 10 mCi Tc-40m sestamibi. Subsequently, exercise tolerance test was performed by the patient under the supervision of the Cardiology staff. At peak-stress, 30 mCi Tc-89m sestamibi was injected intravenously and standard myocardial SPECT imaging was performed. Quantitative gated imaging was also performed to evaluate left ventricular wall motion, and estimate left ventricular ejection fraction.   COMPARISON:  None.   FINDINGS: The patient was stressed according to the Bruce protocol for 6 min and 39 seconds, achieving a work level of 9 Mets. The resting heart rate of 51 beats per min rose to a maximal heart rate of 121 beats per min. This value represents 79% of the maximal, age predicted heart rate. The resting blood pressure of 146/97 rose to  213/103. The stress test was stopped due to chest discomfort and dynamic ST segment changes. Sublingual nitroglycerin was administered to the patient with alleviation of symptoms.   The resting ECG shows sinus bradycardia, 52 beats per min. With stress, there were nonspecific ST segment changes in the inferior leads, less than 1 mm horizontal ST segment depression in V4, and 1.5 mm horizontal and then downsloping ST segment depressions in V5-6. PAC's and isolated PVC's were also noted.   Analysis of the raw data showed no significant extracardiac radiotracer uptake. Analysis of the perfusion stress images showed moderate sized, moderate to severe in intensity, apical anterior, apical anteroseptal, apical inferior, and inferoseptal defects with normalized perfusion on the resting images. This is indicative of a significant degree of ischemia in the LAD distribution. There is also a moderate-sized, mild to moderate intensity, fixed mid inferior wall defect, indicative of myocardial scar with some degree of soft tissue attenuation. The reversible abnormalities demonstrated hypokinesis. Left ventricular systolic function was normal, calculated LV EF 57%. The summed difference score was 9.   IMPRESSION: 1. Abnormal exercise Cardiolite stress test with complaints of chest pain reported, relieved with SL nitroglycerin.   2. A large area of ischemia is noted in the apical and inferoseptal walls.   3. Myocardial scar with overlying soft tissue attenuation is noted in the mid-inferior wall.   4. Left ventricular systolic function is normal, calculated LVEF 57%. The ischemic regions demonstrated hypokinesis.   5.  Significant ST depressions also noted as detailed above.   6. This represents a high-risk study for future cardiovascular events.     Electronically Signed   By: Kate Sable   On:  10/01/2013 18:57     Cardiac Catheterization Operative Report  DELMUS SCHLENDER [id number] 2/24/201511:34 AM Alonza Bogus, MD  Procedure Performed:   1. Left Heart Catheterization 2. Selective Coronary Angiography 3. Left ventricular angiogram  Operator: Lauree Chandler, MD  Arterial access site:  Right radial artery.   Indication: 68 yo male with history of HTN, DM, severe LVH, HLD, former tobacco abuse with recent exertional chest pain c/w unstable angina. Stress myoview with anterior ischemia.                                       Procedure Details: The risks, benefits, complications, treatment options, and expected outcomes were discussed with the patient. The patient and/or family concurred with the proposed plan, giving informed consent. The patient was brought to the cath lab after IV hydration was begun and oral premedication was given. The patient was further sedated with Versed and Fentanyl. The right wrist was assessed with an Allens test which was positive. The right wrist was prepped and draped in a sterile fashion. 1% lidocaine was used for local anesthesia. Using the modified Seldinger access technique, a 5 French sheath was placed in the right radial artery. 3 mg Verapamil was given through the sheath. 4500 units IV heparin was given. Standard diagnostic catheters were used to perform selective coronary angiography. A pigtail catheter was used to perform a left ventricular angiogram. The sheath was removed from the right radial artery and a Terumo hemostasis band was applied at the arteriotomy site on the right wrist.   There were no immediate complications. The patient was taken to the recovery area in stable condition.   Hemodynamic Findings: Central aortic pressure: 124/68 Left ventricular pressure: 122/6/12  Angiographic Findings:  Left main: No obstructive disease  Left Anterior Descending Artery: Large caliber vessel that courses to the apex. The ostium has a hazy 90% stenosis. The proximal vessel is heavily calcified with  diffuse 40% stenosis. The mid vessel has a focal 95% stenosis at the takeoff of a moderate caliber septal perforating vessel. There is another long area of 40-50% stenosis in the mid LAD. The distal LAD has 30% stenosis. There are several very small caliber diagonal branches.    Circumflex Artery: Large caliber vessel with large caliber intermediate branch. The intermediate branch has a hazy 95% ostial stenosis. The Circumflex has mild proximal plaque then gives off a large obtuse marginal branch. The obtuse marginal branch has mild plaque. The AV groove Circumflex has 60% stenosis just after the takeoff of the OM branch.    Right Coronary Artery: Large dominant vessel with heavy calcification noted in the proximal, mid and distal vessel. The proximal and mid vessel has diffuse 30% stenosis. The distal vessel has long 80%, hazy stenosis. The PDA and posterolateral branch has mild plaque.   Left Ventricular Angiogram: LVEF=65%  Impression: 1. Triple vessel CAD 2. Preserved LV systolic function with LVH 3. Unstable angina.    Recommendations: He has multi-vessel CAD with severe disease in the ostium of the LAD and the ostium of the large intermediate branch as well as severe disease in the distal RCA. His anatomy is not favorable for PCI. Will consult CT surgery for CABG.          Complications:  None. The patient tolerated the procedure well.  Impression:  The patient has severe three-vessel coronary artery disease including complex ostial stenosis of the left anterior descending coronary artery with coronary anatomy relatively unfavorable for percutaneous coronary intervention.  Left ventricular systolic function is preserved although the patient does have severe left ventricular hypertrophy and likely significant diastolic dysfunction.  He presents with stable symptoms of exertional angina.    Plan:  I have again reviewed the indications, risks, and potential benefits  of coronary artery bypass grafting with the patient and his wife. Alternative treatment strategies have been discussed. The patient understands and accepts all potential associated risks of surgery including but not limited to risk of death, stroke or other neurologic complication, myocardial infarction, congestive heart failure, respiratory failure, renal failure, bleeding requiring blood transfusion and/or reexploration, aortic dissection or other major vascular complication, arrhythmia, heart block or bradycardia requiring permanent pacemaker, pneumonia, pleural effusion, wound infection, pulmonary embolus or other thromboembolic complication, chronic pain or other delayed complications related to median sternotomy, or the late recurrence of symptomatic ischemic heart disease and/or congestive heart failure. The importance of long term risk modification have been emphasized. All questions answered. We tentatively plan for surgery tomorrow morning.    Valentina Gu. Roxy Manns, MD 10/18/2013 3:47 PM

## 2013-10-18 NOTE — Progress Notes (Signed)
Pt notified of positive staph. He states he thinks he still has Mupirocin ointment and will start treatment tonight. He states he will call me back if he doesn't have it. I told him if he didn't have it, we could just start treatment in the morning when he gets here for his surgery. He voiced understanding.

## 2013-10-18 NOTE — Pre-Procedure Instructions (Signed)
Brandon [name]  10/18/2013   Your procedure is scheduled on:  Fri, Mar 6 @ 7:30 AM  Report to [name] Stay Entrance A  at 5:30 AM.  Call this number if you have problems the morning of surgery: [phone]   Remember:   Do not eat food or drink liquids after midnight.   Take these medicines the morning of surgery with A SIP OF WATER: Metoprolol(Lopressor) and Omeprazole(Prilosec)              No Goody's,BC's,Aleve,Ibuprofen,Fish Oil,or any Herbal Medications   Do not wear jewelry  Do not wear lotions, powders, or colognes. You may wear deodorant.  Men may shave face and neck.  Do not bring valuables to the hospital.  [facility] is not responsible                  for any belongings or valuables.               Contacts, dentures or bridgework may not be worn into surgery.  Leave suitcase in the car. After surgery it may be brought to your room.  For patients admitted to the hospital, discharge time is determined by your                treatment team.                Special Instructions:  [place] - Preparing for Surgery  Before surgery, you can play an important role.  Because skin is not sterile, your skin needs to be as free of germs as possible.  You can reduce the number of germs on you skin by washing with CHG (chlorahexidine gluconate) soap before surgery.  CHG is an antiseptic cleaner which kills germs and bonds with the skin to continue killing germs even after washing.  Please DO NOT use if you have an allergy to CHG or antibacterial soaps.  If your skin becomes reddened/irritated stop using the CHG and inform your nurse when you arrive at Short Stay.  Do not shave (including legs and underarms) for at least 48 hours prior to the first CHG shower.  You may shave your face.  Please follow these instructions carefully:   1.  Shower with CHG Soap the night before surgery and the                                morning of Surgery.  2.  If you choose to wash your  hair, wash your hair first as usual with your       normal shampoo.  3.  After you shampoo, rinse your hair and body thoroughly to remove the                      Shampoo.  4.  Use CHG as you would any other liquid soap.  You can apply chg directly       to the skin and wash gently with scrungie or a clean washcloth.  5.  Apply the CHG Soap to your body ONLY FROM THE NECK DOWN.        Do not use on open wounds or open sores.  Avoid contact with your eyes,       ears, mouth and genitals (private parts).  Wash genitals (private parts)       with your normal soap.  6.  Wash thoroughly, paying  special attention to the area where your surgery        will be performed.  7.  Thoroughly rinse your body with warm water from the neck down.  8.  DO NOT shower/wash with your normal soap after using and rinsing off       the CHG Soap.  9.  Pat yourself dry with a clean towel.            10.  Wear clean pajamas.            11.  Place clean sheets on your bed the night of your first shower and do not        sleep with pets.  Day of Surgery  Do not apply any lotions/deoderants the morning of surgery.  Please wear clean clothes to the hospital/surgery center.     Please read over the following fact sheets that you were given: Pain Booklet, Coughing and Deep Breathing, Blood Transfusion Information, MRSA Information and Surgical Site Infection Prevention

## 2013-10-19 ENCOUNTER — Inpatient Hospital Stay (HOSPITAL_COMMUNITY)
Admission: RE | Admit: 2013-10-19 | Discharge: 2013-10-23 | DRG: 236 | Disposition: A | Discharge status: Home / Self Care | Payer: Medicare Other | Source: Ambulatory Visit | Attending: Thoracic Surgery (Cardiothoracic Vascular Surgery) | Admitting: Thoracic Surgery (Cardiothoracic Vascular Surgery)

## 2013-10-19 ENCOUNTER — Encounter (HOSPITAL_COMMUNITY): Payer: Medicare Other | Admitting: Anesthesiology

## 2013-10-19 ENCOUNTER — Encounter (HOSPITAL_COMMUNITY)
Admission: RE | Disposition: A | Discharge status: Home / Self Care | Payer: Medicare Other | Source: Ambulatory Visit | Attending: Thoracic Surgery (Cardiothoracic Vascular Surgery)

## 2013-10-19 ENCOUNTER — Inpatient Hospital Stay (HOSPITAL_COMMUNITY): Payer: Medicare Other

## 2013-10-19 ENCOUNTER — Inpatient Hospital Stay (HOSPITAL_COMMUNITY): Payer: Medicare Other | Admitting: Anesthesiology

## 2013-10-19 ENCOUNTER — Encounter (HOSPITAL_COMMUNITY): Payer: Self-pay | Admitting: *Deleted

## 2013-10-19 DIAGNOSIS — M5137 Other intervertebral disc degeneration, lumbosacral region: Secondary | ICD-10-CM | POA: Diagnosis present

## 2013-10-19 DIAGNOSIS — I4891 Unspecified atrial fibrillation: Secondary | ICD-10-CM | POA: Diagnosis not present

## 2013-10-19 DIAGNOSIS — M129 Arthropathy, unspecified: Secondary | ICD-10-CM | POA: Diagnosis present

## 2013-10-19 DIAGNOSIS — I200 Unstable angina: Secondary | ICD-10-CM | POA: Diagnosis present

## 2013-10-19 DIAGNOSIS — I2510 Atherosclerotic heart disease of native coronary artery without angina pectoris: Principal | ICD-10-CM

## 2013-10-19 DIAGNOSIS — Z96659 Presence of unspecified artificial knee joint: Secondary | ICD-10-CM | POA: Diagnosis present

## 2013-10-19 DIAGNOSIS — Z951 Presence of aortocoronary bypass graft: Secondary | ICD-10-CM

## 2013-10-19 DIAGNOSIS — E782 Mixed hyperlipidemia: Secondary | ICD-10-CM | POA: Diagnosis present

## 2013-10-19 DIAGNOSIS — Z7982 Long term (current) use of aspirin: Secondary | ICD-10-CM

## 2013-10-19 DIAGNOSIS — H9190 Unspecified hearing loss, unspecified ear: Secondary | ICD-10-CM | POA: Diagnosis present

## 2013-10-19 DIAGNOSIS — Z8249 Family history of ischemic heart disease and other diseases of the circulatory system: Secondary | ICD-10-CM | POA: Diagnosis present

## 2013-10-19 DIAGNOSIS — D696 Thrombocytopenia, unspecified: Secondary | ICD-10-CM | POA: Diagnosis not present

## 2013-10-19 DIAGNOSIS — Z01810 Encounter for preprocedural cardiovascular examination: Secondary | ICD-10-CM

## 2013-10-19 DIAGNOSIS — E8779 Other fluid overload: Secondary | ICD-10-CM | POA: Diagnosis not present

## 2013-10-19 DIAGNOSIS — M51379 Other intervertebral disc degeneration, lumbosacral region without mention of lumbar back pain or lower extremity pain: Secondary | ICD-10-CM | POA: Diagnosis present

## 2013-10-19 DIAGNOSIS — J9819 Other pulmonary collapse: Secondary | ICD-10-CM | POA: Diagnosis not present

## 2013-10-19 DIAGNOSIS — I498 Other specified cardiac arrhythmias: Secondary | ICD-10-CM | POA: Diagnosis present

## 2013-10-19 DIAGNOSIS — M109 Gout, unspecified: Secondary | ICD-10-CM | POA: Diagnosis present

## 2013-10-19 DIAGNOSIS — I10 Essential (primary) hypertension: Secondary | ICD-10-CM | POA: Diagnosis present

## 2013-10-19 DIAGNOSIS — Z801 Family history of malignant neoplasm of trachea, bronchus and lung: Secondary | ICD-10-CM | POA: Diagnosis present

## 2013-10-19 DIAGNOSIS — F17200 Nicotine dependence, unspecified, uncomplicated: Secondary | ICD-10-CM | POA: Diagnosis present

## 2013-10-19 DIAGNOSIS — Z79899 Other long term (current) drug therapy: Secondary | ICD-10-CM

## 2013-10-19 DIAGNOSIS — K219 Gastro-esophageal reflux disease without esophagitis: Secondary | ICD-10-CM | POA: Diagnosis present

## 2013-10-19 DIAGNOSIS — D62 Acute posthemorrhagic anemia: Secondary | ICD-10-CM | POA: Diagnosis not present

## 2013-10-19 DIAGNOSIS — I2584 Coronary atherosclerosis due to calcified coronary lesion: Secondary | ICD-10-CM | POA: Diagnosis present

## 2013-10-19 DIAGNOSIS — Z01812 Encounter for preprocedural laboratory examination: Secondary | ICD-10-CM

## 2013-10-19 DIAGNOSIS — R7309 Other abnormal glucose: Secondary | ICD-10-CM | POA: Diagnosis present

## 2013-10-19 HISTORY — PX: CORONARY ARTERY BYPASS GRAFT: SHX141

## 2013-10-19 HISTORY — PX: INTRAOPERATIVE TRANSESOPHAGEAL ECHOCARDIOGRAM: SHX5062

## 2013-10-19 LAB — POCT I-STAT, CHEM 8
BUN: 12 mg/dL (ref 6–23)
Calcium, Ion: 1.09 mmol/L — ABNORMAL LOW (ref 1.13–1.30)
Chloride: 107 mEq/L (ref 96–112)
Creatinine, Ser: 0.7 mg/dL (ref 0.50–1.35)
Glucose, Bld: 140 mg/dL — ABNORMAL HIGH (ref 70–99)
HCT: 40 % (ref 39.0–52.0)
Hemoglobin: 13.6 g/dL (ref 13.0–17.0)
Potassium: 3.7 mEq/L (ref 3.7–5.3)
Sodium: 141 mEq/L (ref 137–147)
TCO2: 21 mmol/L (ref 0–100)

## 2013-10-19 LAB — POCT I-STAT 4, (NA,K, GLUC, HGB,HCT)
Glucose, Bld: 127 mg/dL — ABNORMAL HIGH (ref 70–99)
Glucose, Bld: 147 mg/dL — ABNORMAL HIGH (ref 70–99)
Glucose, Bld: 190 mg/dL — ABNORMAL HIGH (ref 70–99)
Glucose, Bld: 194 mg/dL — ABNORMAL HIGH (ref 70–99)
Glucose, Bld: 136 mg/dL — ABNORMAL HIGH (ref 70–99)
HCT: 40 % (ref 39.0–52.0)
HCT: 29 % — ABNORMAL LOW (ref 39.0–52.0)
HCT: 27 % — ABNORMAL LOW (ref 39.0–52.0)
HCT: 30 % — ABNORMAL LOW (ref 39.0–52.0)
HCT: 39 % (ref 39.0–52.0)
Hemoglobin: 13.6 g/dL (ref 13.0–17.0)
Hemoglobin: 9.9 g/dL — ABNORMAL LOW (ref 13.0–17.0)
Hemoglobin: 10.2 g/dL — ABNORMAL LOW (ref 13.0–17.0)
Hemoglobin: 9.2 g/dL — ABNORMAL LOW (ref 13.0–17.0)
Hemoglobin: 13.3 g/dL (ref 13.0–17.0)
Potassium: 3.4 mEq/L — ABNORMAL LOW (ref 3.7–5.3)
Potassium: 5.4 mEq/L — ABNORMAL HIGH (ref 3.7–5.3)
Potassium: 4.6 mEq/L (ref 3.7–5.3)
Potassium: 5.1 mEq/L (ref 3.7–5.3)
Potassium: 3.6 mEq/L — ABNORMAL LOW (ref 3.7–5.3)
Sodium: 140 mEq/L (ref 137–147)
Sodium: 136 mEq/L — ABNORMAL LOW (ref 137–147)
Sodium: 133 mEq/L — ABNORMAL LOW (ref 137–147)
Sodium: 137 mEq/L (ref 137–147)
Sodium: 138 mEq/L (ref 137–147)

## 2013-10-19 LAB — POCT I-STAT 3, ART BLOOD GAS (G3+)
Acid-base deficit: 2 mmol/L (ref 0.0–2.0)
Acid-base deficit: 3 mmol/L — ABNORMAL HIGH (ref 0.0–2.0)
Acid-base deficit: 3 mmol/L — ABNORMAL HIGH (ref 0.0–2.0)
Acid-base deficit: 4 mmol/L — ABNORMAL HIGH (ref 0.0–2.0)
Bicarbonate: 26 mEq/L — ABNORMAL HIGH (ref 20.0–24.0)
Bicarbonate: 23.4 mEq/L (ref 20.0–24.0)
Bicarbonate: 23.1 mEq/L (ref 20.0–24.0)
Bicarbonate: 22.6 mEq/L (ref 20.0–24.0)
Bicarbonate: 21.4 mEq/L (ref 20.0–24.0)
O2 Saturation: 100 %
O2 Saturation: 100 %
O2 Saturation: 94 %
O2 Saturation: 99 %
O2 Saturation: 95 %
Patient temperature: 36.2
Patient temperature: 36.9
Patient temperature: 37.4
TCO2: 27 mmol/L (ref 0–100)
TCO2: 25 mmol/L (ref 0–100)
TCO2: 24 mmol/L (ref 0–100)
TCO2: 24 mmol/L (ref 0–100)
TCO2: 23 mmol/L (ref 0–100)
pCO2 arterial: 46.4 mmHg — ABNORMAL HIGH (ref 35.0–45.0)
pCO2 arterial: 44 mmHg (ref 35.0–45.0)
pCO2 arterial: 41.3 mmHg (ref 35.0–45.0)
pCO2 arterial: 40.8 mmHg (ref 35.0–45.0)
pCO2 arterial: 40.2 mmHg (ref 35.0–45.0)
pH, Arterial: 7.356 (ref 7.350–7.450)
pH, Arterial: 7.335 — ABNORMAL LOW (ref 7.350–7.450)
pH, Arterial: 7.352 (ref 7.350–7.450)
pH, Arterial: 7.351 (ref 7.350–7.450)
pH, Arterial: 7.336 — ABNORMAL LOW (ref 7.350–7.450)
pO2, Arterial: 292 mmHg — ABNORMAL HIGH (ref 80.0–100.0)
pO2, Arterial: 258 mmHg — ABNORMAL HIGH (ref 80.0–100.0)
pO2, Arterial: 71 mmHg — ABNORMAL LOW (ref 80.0–100.0)
pO2, Arterial: 138 mmHg — ABNORMAL HIGH (ref 80.0–100.0)
pO2, Arterial: 80 mmHg (ref 80.0–100.0)

## 2013-10-19 LAB — CBC
HCT: 40.2 % (ref 39.0–52.0)
HCT: 40.6 % (ref 39.0–52.0)
Hemoglobin: 13.9 g/dL (ref 13.0–17.0)
Hemoglobin: 13.8 g/dL (ref 13.0–17.0)
MCH: 31.2 pg (ref 26.0–34.0)
MCH: 31 pg (ref 26.0–34.0)
MCHC: 34.6 g/dL (ref 30.0–36.0)
MCHC: 34 g/dL (ref 30.0–36.0)
MCV: 90.3 fL (ref 78.0–100.0)
MCV: 91.2 fL (ref 78.0–100.0)
Platelets: 142 10*3/uL — ABNORMAL LOW (ref 150–400)
Platelets: 164 10*3/uL (ref 150–400)
RBC: 4.45 MIL/uL (ref 4.22–5.81)
RBC: 4.45 MIL/uL (ref 4.22–5.81)
RDW: 13.2 % (ref 11.5–15.5)
RDW: 13.2 % (ref 11.5–15.5)
WBC: 12.8 10*3/uL — ABNORMAL HIGH (ref 4.0–10.5)
WBC: 12.8 10*3/uL — ABNORMAL HIGH (ref 4.0–10.5)

## 2013-10-19 LAB — CREATININE, SERUM
Creatinine, Ser: 0.69 mg/dL (ref 0.50–1.35)
GFR calc Af Amer: >90 mL/min (ref >90)
GFR calc non Af Amer: >90 mL/min (ref >90)

## 2013-10-19 LAB — GLUCOSE, CAPILLARY
Glucose-Capillary: 117 mg/dL — ABNORMAL HIGH (ref 70–99)
Glucose-Capillary: 125 mg/dL — ABNORMAL HIGH (ref 70–99)
Glucose-Capillary: 119 mg/dL — ABNORMAL HIGH (ref 70–99)
Glucose-Capillary: 124 mg/dL — ABNORMAL HIGH (ref 70–99)
Glucose-Capillary: 122 mg/dL — ABNORMAL HIGH (ref 70–99)
Glucose-Capillary: 130 mg/dL — ABNORMAL HIGH (ref 70–99)
Glucose-Capillary: 129 mg/dL — ABNORMAL HIGH (ref 70–99)
Glucose-Capillary: 150 mg/dL — ABNORMAL HIGH (ref 70–99)
Glucose-Capillary: 132 mg/dL — ABNORMAL HIGH (ref 70–99)
Glucose-Capillary: 121 mg/dL — ABNORMAL HIGH (ref 70–99)

## 2013-10-19 LAB — PROTIME-INR
INR: 1.27 (ref 0.00–1.49)
Prothrombin Time: 15.6 seconds — ABNORMAL HIGH (ref 11.6–15.2)

## 2013-10-19 LAB — POCT I-STAT GLUCOSE
Glucose, Bld: 161 mg/dL — ABNORMAL HIGH (ref 70–99)
Operator id: 3406

## 2013-10-19 LAB — MAGNESIUM: Magnesium: 2.7 mg/dL — ABNORMAL HIGH (ref 1.5–2.5)

## 2013-10-19 LAB — APTT: aPTT: 30 seconds (ref 24–37)

## 2013-10-19 LAB — HEMOGLOBIN AND HEMATOCRIT, BLOOD
HCT: 30 % — ABNORMAL LOW (ref 39.0–52.0)
Hemoglobin: 10.3 g/dL — ABNORMAL LOW (ref 13.0–17.0)

## 2013-10-19 LAB — PLATELET COUNT: Platelets: 173 10*3/uL (ref 150–400)

## 2013-10-19 SURGERY — CORONARY ARTERY BYPASS GRAFTING (CABG)
Anesthesia: General | Site: Esophagus

## 2013-10-19 MED ORDER — DOCUSATE SODIUM 100 MG PO CAPS
200.0000 mg | ORAL_CAPSULE | Freq: Every day | ORAL | Status: DC
  Administered 2013-10-20 – 2013-10-22 (×3): 200 mg via ORAL
  Filled 2013-10-19 (×4): qty 2

## 2013-10-19 MED ORDER — SODIUM CHLORIDE 0.9 % IJ SOLN
INTRAMUSCULAR | Status: AC
  Filled 2013-10-19: qty 10

## 2013-10-19 MED ORDER — MIDAZOLAM HCL 10 MG/2ML IJ SOLN
INTRAMUSCULAR | Status: AC
  Filled 2013-10-19: qty 2

## 2013-10-19 MED ORDER — SODIUM CHLORIDE 0.9 % IV SOLN: INTRAVENOUS | Status: DC

## 2013-10-19 MED ORDER — BISACODYL 10 MG RE SUPP: 10.0000 mg | Freq: Every day | RECTAL | Status: DC

## 2013-10-19 MED ORDER — FENTANYL CITRATE 0.05 MG/ML IJ SOLN
INTRAMUSCULAR | Status: AC
  Filled 2013-10-19: qty 5

## 2013-10-19 MED ORDER — MORPHINE SULFATE 2 MG/ML IJ SOLN
2.0000 mg | INTRAMUSCULAR | Status: DC | PRN
  Administered 2013-10-19: 2 mg via INTRAVENOUS
  Administered 2013-10-19: 4 mg via INTRAVENOUS
  Administered 2013-10-19 (×3): 2 mg via INTRAVENOUS
  Administered 2013-10-19 – 2013-10-20 (×3): 4 mg via INTRAVENOUS
  Filled 2013-10-19 (×2): qty 2
  Filled 2013-10-19: qty 1
  Filled 2013-10-19 (×2): qty 2
  Filled 2013-10-19: qty 1

## 2013-10-19 MED ORDER — LACTATED RINGERS IV SOLN: INTRAVENOUS | Status: DC

## 2013-10-19 MED ORDER — ASPIRIN 81 MG PO CHEW: 324.0000 mg | CHEWABLE_TABLET | Freq: Every day | ORAL | Status: DC

## 2013-10-19 MED ORDER — MIDAZOLAM HCL 2 MG/2ML IJ SOLN: 2.0000 mg | INTRAMUSCULAR | Status: DC | PRN

## 2013-10-19 MED ORDER — MIDAZOLAM HCL 5 MG/5ML IJ SOLN
INTRAMUSCULAR | Status: DC | PRN
  Administered 2013-10-19 (×2): 2 mg via INTRAVENOUS
  Administered 2013-10-19: 4 mg via INTRAVENOUS
  Administered 2013-10-19: 3 mg via INTRAVENOUS
  Administered 2013-10-19 (×2): 2 mg via INTRAVENOUS
  Administered 2013-10-19: 5 mg via INTRAVENOUS

## 2013-10-19 MED ORDER — METOPROLOL TARTRATE 25 MG/10 ML ORAL SUSPENSION
12.5000 mg | Freq: Two times a day (BID) | ORAL | Status: DC
  Filled 2013-10-19 (×3): qty 5

## 2013-10-19 MED ORDER — DEXTROSE 5 % IV SOLN
INTRAVENOUS | Status: DC | PRN
  Administered 2013-10-19: 08:00:00 via INTRAVENOUS

## 2013-10-19 MED ORDER — DEXMEDETOMIDINE HCL IN NACL 200 MCG/50ML IV SOLN
0.1000 ug/kg/h | INTRAVENOUS | Status: DC
  Administered 2013-10-19: 0.4 ug/kg/h via INTRAVENOUS
  Filled 2013-10-19: qty 50

## 2013-10-19 MED ORDER — METOPROLOL TARTRATE 12.5 MG HALF TABLET: 12.5000 mg | ORAL_TABLET | Freq: Once | ORAL | Status: DC

## 2013-10-19 MED ORDER — LACTATED RINGERS IV SOLN
INTRAVENOUS | Status: DC | PRN
  Administered 2013-10-19: 07:00:00 via INTRAVENOUS

## 2013-10-19 MED ORDER — 0.9 % SODIUM CHLORIDE (POUR BTL) OPTIME
TOPICAL | Status: DC | PRN
  Administered 2013-10-19: 6000 mL

## 2013-10-19 MED ORDER — SODIUM CHLORIDE 0.9 % IV SOLN
INTRAVENOUS | Status: DC | PRN
  Administered 2013-10-19: 08:00:00 via INTRAVENOUS

## 2013-10-19 MED ORDER — VECURONIUM BROMIDE 10 MG IV SOLR
INTRAVENOUS | Status: DC | PRN
  Administered 2013-10-19 (×4): 5 mg via INTRAVENOUS

## 2013-10-19 MED ORDER — ACETAMINOPHEN 650 MG RE SUPP
650.0000 mg | Freq: Once | RECTAL | Status: AC
  Administered 2013-10-19: 650 mg via RECTAL

## 2013-10-19 MED ORDER — FAMOTIDINE IN NACL 20-0.9 MG/50ML-% IV SOLN
20.0000 mg | Freq: Two times a day (BID) | INTRAVENOUS | Status: AC
  Administered 2013-10-19: 20 mg via INTRAVENOUS

## 2013-10-19 MED ORDER — ALBUMIN HUMAN 5 % IV SOLN
250.0000 mL | INTRAVENOUS | Status: AC | PRN
  Administered 2013-10-19 (×2): 250 mL via INTRAVENOUS
  Filled 2013-10-19 (×2): qty 250

## 2013-10-19 MED ORDER — SODIUM CHLORIDE 0.9 % IV SOLN
20.0000 mg | INTRAVENOUS | Status: DC | PRN
  Administered 2013-10-19: 10 ug/min via INTRAVENOUS

## 2013-10-19 MED ORDER — PROTAMINE SULFATE 10 MG/ML IV SOLN
INTRAVENOUS | Status: DC | PRN
  Administered 2013-10-19: 250 mg via INTRAVENOUS

## 2013-10-19 MED ORDER — STERILE WATER FOR INJECTION IJ SOLN
INTRAMUSCULAR | Status: AC
  Filled 2013-10-19: qty 10

## 2013-10-19 MED ORDER — SODIUM CHLORIDE 0.9 % IJ SOLN
3.0000 mL | Freq: Two times a day (BID) | INTRAMUSCULAR | Status: DC
  Administered 2013-10-20 – 2013-10-22 (×5): 3 mL via INTRAVENOUS

## 2013-10-19 MED ORDER — ASPIRIN EC 325 MG PO TBEC
325.0000 mg | DELAYED_RELEASE_TABLET | Freq: Every day | ORAL | Status: DC
  Administered 2013-10-20 – 2013-10-22 (×3): 325 mg via ORAL
  Filled 2013-10-19 (×3): qty 1

## 2013-10-19 MED ORDER — ARTIFICIAL TEARS OP OINT
TOPICAL_OINTMENT | OPHTHALMIC | Status: AC
  Filled 2013-10-19: qty 3.5

## 2013-10-19 MED ORDER — VANCOMYCIN HCL IN DEXTROSE 1-5 GM/200ML-% IV SOLN
1000.0000 mg | Freq: Once | INTRAVENOUS | Status: AC
  Administered 2013-10-19: 1000 mg via INTRAVENOUS
  Filled 2013-10-19: qty 200

## 2013-10-19 MED ORDER — DEXTROSE 5 % IV SOLN
1.5000 g | Freq: Two times a day (BID) | INTRAVENOUS | Status: AC
  Administered 2013-10-19 – 2013-10-21 (×4): 1.5 g via INTRAVENOUS
  Filled 2013-10-19 (×4): qty 1.5

## 2013-10-19 MED ORDER — ROCURONIUM BROMIDE 100 MG/10ML IV SOLN
INTRAVENOUS | Status: DC | PRN
  Administered 2013-10-19: 50 mg via INTRAVENOUS

## 2013-10-19 MED ORDER — HEPARIN SODIUM (PORCINE) 1000 UNIT/ML IJ SOLN
INTRAMUSCULAR | Status: AC
  Filled 2013-10-19: qty 1

## 2013-10-19 MED ORDER — SODIUM CHLORIDE 0.9 % IJ SOLN
OROMUCOSAL | Status: DC | PRN
  Administered 2013-10-19 (×3): via TOPICAL

## 2013-10-19 MED ORDER — ACETAMINOPHEN 160 MG/5ML PO SOLN: 650.0000 mg | Freq: Once | ORAL | Status: AC

## 2013-10-19 MED ORDER — EPHEDRINE SULFATE 50 MG/ML IJ SOLN
INTRAMUSCULAR | Status: AC
  Filled 2013-10-19: qty 1

## 2013-10-19 MED ORDER — LIDOCAINE HCL (CARDIAC) 20 MG/ML IV SOLN
INTRAVENOUS | Status: DC | PRN
  Administered 2013-10-19: 40 mg via INTRAVENOUS

## 2013-10-19 MED ORDER — POTASSIUM CHLORIDE 10 MEQ/50ML IV SOLN
10.0000 meq | INTRAVENOUS | Status: AC
  Administered 2013-10-19 (×3): 10 meq via INTRAVENOUS

## 2013-10-19 MED ORDER — FENTANYL CITRATE 0.05 MG/ML IJ SOLN
INTRAMUSCULAR | Status: DC | PRN
  Administered 2013-10-19: 400 ug via INTRAVENOUS
  Administered 2013-10-19 (×2): 250 ug via INTRAVENOUS
  Administered 2013-10-19: 100 ug via INTRAVENOUS
  Administered 2013-10-19 (×3): 250 ug via INTRAVENOUS

## 2013-10-19 MED ORDER — METOPROLOL TARTRATE 1 MG/ML IV SOLN: 2.5000 mg | INTRAVENOUS | Status: DC | PRN

## 2013-10-19 MED ORDER — LACTATED RINGERS IV SOLN: 500.0000 mL | Freq: Once | INTRAVENOUS | Status: AC | PRN

## 2013-10-19 MED ORDER — EPHEDRINE SULFATE 50 MG/ML IJ SOLN
INTRAMUSCULAR | Status: DC | PRN
  Administered 2013-10-19: 2.5 mg via INTRAVENOUS

## 2013-10-19 MED ORDER — MAGNESIUM SULFATE 4000MG/100ML IJ SOLN
4.0000 g | Freq: Once | INTRAMUSCULAR | Status: AC
  Administered 2013-10-19: 4 g via INTRAVENOUS
  Filled 2013-10-19: qty 100

## 2013-10-19 MED ORDER — SODIUM CHLORIDE 0.45 % IV SOLN
INTRAVENOUS | Status: DC
  Administered 2013-10-19: 13:00:00 via INTRAVENOUS

## 2013-10-19 MED ORDER — NITROGLYCERIN IN D5W 200-5 MCG/ML-% IV SOLN: 0.0000 ug/min | INTRAVENOUS | Status: DC

## 2013-10-19 MED ORDER — PROTAMINE SULFATE 10 MG/ML IV SOLN
INTRAVENOUS | Status: AC
  Filled 2013-10-19: qty 25

## 2013-10-19 MED ORDER — POTASSIUM CHLORIDE 10 MEQ/50ML IV SOLN
10.0000 meq | INTRAVENOUS | Status: AC | PRN
  Administered 2013-10-19 (×3): 10 meq via INTRAVENOUS

## 2013-10-19 MED ORDER — ONDANSETRON HCL 4 MG/2ML IJ SOLN
4.0000 mg | Freq: Four times a day (QID) | INTRAMUSCULAR | Status: DC | PRN
  Administered 2013-10-20 – 2013-10-21 (×2): 4 mg via INTRAVENOUS
  Filled 2013-10-19 (×4): qty 2

## 2013-10-19 MED ORDER — SODIUM CHLORIDE 0.9 % IV SOLN: 250.0000 mL | INTRAVENOUS | Status: AC

## 2013-10-19 MED ORDER — OXYCODONE HCL 5 MG PO TABS
5.0000 mg | ORAL_TABLET | ORAL | Status: DC | PRN
  Administered 2013-10-19 – 2013-10-20 (×4): 10 mg via ORAL
  Filled 2013-10-19 (×4): qty 2

## 2013-10-19 MED ORDER — METOPROLOL TARTRATE 12.5 MG HALF TABLET
12.5000 mg | ORAL_TABLET | Freq: Two times a day (BID) | ORAL | Status: DC
  Administered 2013-10-20 – 2013-10-21 (×4): 12.5 mg via ORAL
  Filled 2013-10-19 (×7): qty 1

## 2013-10-19 MED ORDER — HEPARIN SODIUM (PORCINE) 1000 UNIT/ML IJ SOLN
INTRAMUSCULAR | Status: DC | PRN
  Administered 2013-10-19: 3000 [IU] via INTRAVENOUS
  Administered 2013-10-19: 27000 [IU] via INTRAVENOUS

## 2013-10-19 MED ORDER — BISACODYL 5 MG PO TBEC
10.0000 mg | DELAYED_RELEASE_TABLET | Freq: Every day | ORAL | Status: DC
  Administered 2013-10-20 – 2013-10-21 (×2): 10 mg via ORAL
  Filled 2013-10-19 (×2): qty 2

## 2013-10-19 MED ORDER — INSULIN REGULAR HUMAN 100 UNIT/ML IJ SOLN
INTRAMUSCULAR | Status: DC
  Administered 2013-10-19: 3.5 [IU]/h via INTRAVENOUS
  Filled 2013-10-19 (×2): qty 1

## 2013-10-19 MED ORDER — MORPHINE SULFATE 2 MG/ML IJ SOLN
1.0000 mg | INTRAMUSCULAR | Status: AC | PRN
  Filled 2013-10-19: qty 2

## 2013-10-19 MED ORDER — PHENYLEPHRINE HCL 10 MG/ML IJ SOLN
0.0000 ug/min | INTRAVENOUS | Status: DC
  Filled 2013-10-19: qty 2

## 2013-10-19 MED ORDER — PANTOPRAZOLE SODIUM 40 MG PO TBEC
40.0000 mg | DELAYED_RELEASE_TABLET | Freq: Every day | ORAL | Status: DC
  Administered 2013-10-21 – 2013-10-23 (×3): 40 mg via ORAL
  Filled 2013-10-19 (×4): qty 1

## 2013-10-19 MED ORDER — ACETAMINOPHEN 500 MG PO TABS
1000.0000 mg | ORAL_TABLET | Freq: Four times a day (QID) | ORAL | Status: DC
  Administered 2013-10-19 – 2013-10-23 (×12): 1000 mg via ORAL
  Filled 2013-10-19 (×20): qty 2

## 2013-10-19 MED ORDER — PHENYLEPHRINE 40 MCG/ML (10ML) SYRINGE FOR IV PUSH (FOR BLOOD PRESSURE SUPPORT)
PREFILLED_SYRINGE | INTRAVENOUS | Status: AC
  Filled 2013-10-19: qty 20

## 2013-10-19 MED ORDER — PROTAMINE SULFATE 10 MG/ML IV SOLN
INTRAVENOUS | Status: AC
  Filled 2013-10-19: qty 10

## 2013-10-19 MED ORDER — PROPOFOL 10 MG/ML IV BOLUS
INTRAVENOUS | Status: DC | PRN
  Administered 2013-10-19: 50 mg via INTRAVENOUS

## 2013-10-19 MED ORDER — INSULIN REGULAR BOLUS VIA INFUSION
0.0000 [IU] | Freq: Three times a day (TID) | INTRAVENOUS | Status: DC
  Administered 2013-10-20: 5 [IU] via INTRAVENOUS
  Filled 2013-10-19: qty 10

## 2013-10-19 MED ORDER — DEXMEDETOMIDINE HCL IN NACL 400 MCG/100ML IV SOLN
INTRAVENOUS | Status: DC | PRN
  Administered 2013-10-19: 0.2 ug/kg/h via INTRAVENOUS

## 2013-10-19 MED ORDER — ARTIFICIAL TEARS OP OINT
TOPICAL_OINTMENT | OPHTHALMIC | Status: DC | PRN
  Administered 2013-10-19: 1 via OPHTHALMIC

## 2013-10-19 MED ORDER — SODIUM CHLORIDE 0.9 % IJ SOLN: 3.0000 mL | INTRAMUSCULAR | Status: DC | PRN

## 2013-10-19 MED ORDER — ACETAMINOPHEN 160 MG/5ML PO SOLN: 1000.0000 mg | Freq: Four times a day (QID) | ORAL | Status: DC

## 2013-10-19 MED FILL — Heparin Sodium (Porcine) Inj 1000 Unit/ML: INTRAMUSCULAR | Qty: 10 | Status: AC

## 2013-10-19 MED FILL — Electrolyte-R (PH 7.4) Solution: INTRAVENOUS | Qty: 1000 | Status: AC

## 2013-10-19 MED FILL — Mannitol IV Soln 20%: INTRAVENOUS | Qty: 500 | Status: AC

## 2013-10-19 MED FILL — Sodium Chloride IV Soln 0.9%: INTRAVENOUS | Qty: 1000 | Status: AC

## 2013-10-19 MED FILL — Sodium Bicarbonate IV Soln 8.4%: INTRAVENOUS | Qty: 50 | Status: AC

## 2013-10-19 MED FILL — Lidocaine HCl IV Inj 20 MG/ML: INTRAVENOUS | Qty: 5 | Status: AC

## 2013-10-19 SURGICAL SUPPLY — 130 items
ADAPTER CARDIO PERF ANTE/RETRO (ADAPTER) IMPLANT
ADH SKN CLS APL DERMABOND .7 (GAUZE/BANDAGES/DRESSINGS) ×2
ADPR PRFSN 84XANTGRD RTRGD (ADAPTER)
APL SKNCLS STERI-STRIP NONHPOA (GAUZE/BANDAGES/DRESSINGS) ×2
APPLIER CLIP 9.375 MED OPEN (MISCELLANEOUS)
APPLIER CLIP 9.375 SM OPEN (CLIP)
APR CLP MED 9.3 20 MLT OPN (MISCELLANEOUS)
APR CLP SM 9.3 20 MLT OPN (CLIP)
ATTRACTOMAT 16X20 MAGNETIC DRP (DRAPES) ×3 IMPLANT
BAG DECANTER FOR FLEXI CONT (MISCELLANEOUS) ×3 IMPLANT
BANDAGE ELASTIC 4 VELCRO ST LF (GAUZE/BANDAGES/DRESSINGS) ×4 IMPLANT
BANDAGE ELASTIC 6 VELCRO ST LF (GAUZE/BANDAGES/DRESSINGS) ×3 IMPLANT
BANDAGE GAUZE ELAST BULKY 4 IN (GAUZE/BANDAGES/DRESSINGS) ×3 IMPLANT
BASKET HEART (ORDER IN 25'S) (MISCELLANEOUS) ×1
BASKET HEART (ORDER IN 25S) (MISCELLANEOUS) ×2 IMPLANT
BENZOIN TINCTURE PRP APPL 2/3 (GAUZE/BANDAGES/DRESSINGS) ×3 IMPLANT
BLADE STERNUM SYSTEM 6 (BLADE) ×3 IMPLANT
BLADE SURG 11 STRL SS (BLADE) ×1 IMPLANT
BLADE SURG ROTATE 9660 (MISCELLANEOUS) IMPLANT
BNDG GAUZE ELAST 4 BULKY (GAUZE/BANDAGES/DRESSINGS) ×1 IMPLANT
CANISTER SUCTION 2500CC (MISCELLANEOUS) ×3 IMPLANT
CANNULA EZ GLIDE AORTIC 21FR (CANNULA) ×3 IMPLANT
CANNULA GUNDRY RCSP 15FR (MISCELLANEOUS) IMPLANT
CANNULA VENOUS LOW PROF 34X46 (CANNULA) ×3 IMPLANT
CARDIAC SUCTION (MISCELLANEOUS) ×3 IMPLANT
CATH CPB KIT OWEN (MISCELLANEOUS) ×3 IMPLANT
CATH THORACIC 28FR (CATHETERS) IMPLANT
CATH THORACIC 28FR RT ANG (CATHETERS) IMPLANT
CATH THORACIC 36FR (CATHETERS) ×3 IMPLANT
CATH THORACIC 36FR RT ANG (CATHETERS) ×2 IMPLANT
CLIP APPLIE 9.375 MED OPEN (MISCELLANEOUS) IMPLANT
CLIP APPLIE 9.375 SM OPEN (CLIP) IMPLANT
CLIP FOGARTY SPRING 6M (CLIP) IMPLANT
CLIP RETRACTION 3.0MM CORONARY (MISCELLANEOUS) ×1 IMPLANT
CLIP TI MEDIUM 24 (CLIP) IMPLANT
CLIP TI WIDE RED SMALL 24 (CLIP) IMPLANT
CONN ST 1/4X3/8  BEN (MISCELLANEOUS) ×2
CONN ST 1/4X3/8 BEN (MISCELLANEOUS) IMPLANT
CONN Y 3/8X3/8X3/8  BEN (MISCELLANEOUS)
CONN Y 3/8X3/8X3/8 BEN (MISCELLANEOUS) IMPLANT
COVER SURGICAL LIGHT HANDLE (MISCELLANEOUS) ×3 IMPLANT
CRADLE DONUT ADULT HEAD (MISCELLANEOUS) ×3 IMPLANT
DERMABOND ADVANCED (GAUZE/BANDAGES/DRESSINGS) ×1
DERMABOND ADVANCED .7 DNX12 (GAUZE/BANDAGES/DRESSINGS) IMPLANT
DRAIN CHANNEL 32F RND 10.7 FF (WOUND CARE) ×6 IMPLANT
DRAPE CARDIOVASCULAR INCISE (DRAPES) ×3
DRAPE INCISE IOBAN 66X45 STRL (DRAPES) ×1 IMPLANT
DRAPE SLUSH/WARMER DISC (DRAPES) ×3 IMPLANT
DRAPE SRG 135X102X78XABS (DRAPES) ×2 IMPLANT
DRSG COVADERM 4X14 (GAUZE/BANDAGES/DRESSINGS) ×3 IMPLANT
ELECT BLADE 4.0 EZ CLEAN MEGAD (MISCELLANEOUS) ×3
ELECT REM PT RETURN 9FT ADLT (ELECTROSURGICAL) ×6
ELECTRODE BLDE 4.0 EZ CLN MEGD (MISCELLANEOUS) IMPLANT
ELECTRODE REM PT RTRN 9FT ADLT (ELECTROSURGICAL) ×4 IMPLANT
GLOVE BIO SURGEON STRL SZ 6 (GLOVE) IMPLANT
GLOVE BIO SURGEON STRL SZ 6.5 (GLOVE) IMPLANT
GLOVE BIO SURGEON STRL SZ7 (GLOVE) IMPLANT
GLOVE BIO SURGEON STRL SZ7.5 (GLOVE) IMPLANT
GLOVE BIOGEL PI IND STRL 6 (GLOVE) IMPLANT
GLOVE BIOGEL PI IND STRL 6.5 (GLOVE) IMPLANT
GLOVE BIOGEL PI IND STRL 7.0 (GLOVE) IMPLANT
GLOVE BIOGEL PI INDICATOR 6 (GLOVE)
GLOVE BIOGEL PI INDICATOR 6.5 (GLOVE)
GLOVE BIOGEL PI INDICATOR 7.0 (GLOVE)
GLOVE EUDERMIC 7 POWDERFREE (GLOVE) IMPLANT
GLOVE ORTHO TXT STRL SZ7.5 (GLOVE) ×6 IMPLANT
GOWN STRL REUS W/ TWL LRG LVL3 (GOWN DISPOSABLE) ×8 IMPLANT
GOWN STRL REUS W/TWL LRG LVL3 (GOWN DISPOSABLE) ×12
HEMOSTAT POWDER SURGIFOAM 1G (HEMOSTASIS) ×9 IMPLANT
INSERT FOGARTY 61MM (MISCELLANEOUS) IMPLANT
INSERT FOGARTY XLG (MISCELLANEOUS) ×3 IMPLANT
KIT BASIN OR (CUSTOM PROCEDURE TRAY) ×3 IMPLANT
KIT ROOM TURNOVER OR (KITS) ×3 IMPLANT
KIT SUCTION CATH 14FR (SUCTIONS) ×15 IMPLANT
KIT VASOVIEW W/TROCAR VH 2000 (KITS) ×3 IMPLANT
LEAD PACING MYOCARDI (MISCELLANEOUS) ×3 IMPLANT
MARKER GRAFT CORONARY BYPASS (MISCELLANEOUS) ×9 IMPLANT
NS IRRIG 1000ML POUR BTL (IV SOLUTION) ×15 IMPLANT
PACK OPEN HEART (CUSTOM PROCEDURE TRAY) ×3 IMPLANT
PAD ARMBOARD 7.5X6 YLW CONV (MISCELLANEOUS) ×3 IMPLANT
PAD ELECT DEFIB RADIOL ZOLL (MISCELLANEOUS) ×3 IMPLANT
PENCIL BUTTON HOLSTER BLD 10FT (ELECTRODE) ×3 IMPLANT
PUNCH AORTIC ROTATE 4.0MM (MISCELLANEOUS) ×1 IMPLANT
PUNCH AORTIC ROTATE 4.5MM 8IN (MISCELLANEOUS) IMPLANT
PUNCH AORTIC ROTATE 5MM 8IN (MISCELLANEOUS) IMPLANT
SET CARDIOPLEGIA MPS 5001102 (MISCELLANEOUS) ×1 IMPLANT
SOLUTION ANTI FOG 6CC (MISCELLANEOUS) IMPLANT
SPONGE GAUZE 4X4 12PLY (GAUZE/BANDAGES/DRESSINGS) ×6 IMPLANT
SPONGE LAP 18X18 X RAY DECT (DISPOSABLE) IMPLANT
SPONGE LAP 4X18 X RAY DECT (DISPOSABLE) ×1 IMPLANT
SUT BONE WAX W31G (SUTURE) ×3 IMPLANT
SUT ETHIBOND X763 2 0 SH 1 (SUTURE) ×6 IMPLANT
SUT MNCRL AB 3-0 PS2 18 (SUTURE) ×6 IMPLANT
SUT MNCRL AB 4-0 PS2 18 (SUTURE) ×1 IMPLANT
SUT PDS AB 1 CTX 36 (SUTURE) ×6 IMPLANT
SUT PROLENE 2 0 SH DA (SUTURE) IMPLANT
SUT PROLENE 3 0 SH DA (SUTURE) ×4 IMPLANT
SUT PROLENE 3 0 SH1 36 (SUTURE) IMPLANT
SUT PROLENE 4 0 RB 1 (SUTURE)
SUT PROLENE 4 0 SH DA (SUTURE) IMPLANT
SUT PROLENE 4-0 RB1 .5 CRCL 36 (SUTURE) IMPLANT
SUT PROLENE 5 0 C 1 36 (SUTURE) IMPLANT
SUT PROLENE 6 0 C 1 30 (SUTURE) ×1 IMPLANT
SUT PROLENE 7.0 RB 3 (SUTURE) ×11 IMPLANT
SUT PROLENE 8 0 BV175 6 (SUTURE) IMPLANT
SUT PROLENE BLUE 7 0 (SUTURE) ×3 IMPLANT
SUT PROLENE POLY MONO (SUTURE) ×4 IMPLANT
SUT SILK  1 MH (SUTURE) ×4
SUT SILK 1 MH (SUTURE) ×2 IMPLANT
SUT STEEL 6MS V (SUTURE) IMPLANT
SUT STEEL STERNAL CCS#1 18IN (SUTURE) ×1 IMPLANT
SUT STEEL SZ 6 DBL 3X14 BALL (SUTURE) ×2 IMPLANT
SUT VIC AB 1 CTX 36 (SUTURE)
SUT VIC AB 1 CTX36XBRD ANBCTR (SUTURE) IMPLANT
SUT VIC AB 2-0 CT1 27 (SUTURE) ×3
SUT VIC AB 2-0 CT1 TAPERPNT 27 (SUTURE) IMPLANT
SUT VIC AB 2-0 CTX 27 (SUTURE) IMPLANT
SUT VIC AB 3-0 SH 27 (SUTURE)
SUT VIC AB 3-0 SH 27X BRD (SUTURE) IMPLANT
SUT VIC AB 3-0 X1 27 (SUTURE) IMPLANT
SUT VICRYL 4-0 PS2 18IN ABS (SUTURE) IMPLANT
SUTURE E-PAK OPEN HEART (SUTURE) ×3 IMPLANT
SYSTEM SAHARA CHEST DRAIN ATS (WOUND CARE) ×4 IMPLANT
TAPE CLOTH SURG 4X10 WHT LF (GAUZE/BANDAGES/DRESSINGS) ×1 IMPLANT
TOWEL OR 17X24 6PK STRL BLUE (TOWEL DISPOSABLE) ×6 IMPLANT
TOWEL OR 17X26 10 PK STRL BLUE (TOWEL DISPOSABLE) ×6 IMPLANT
TRAY FOLEY IC TEMP SENS 16FR (CATHETERS) ×3 IMPLANT
TUBING INSUFFLATION 10FT LAP (TUBING) ×3 IMPLANT
UNDERPAD 30X30 INCONTINENT (UNDERPADS AND DIAPERS) ×3 IMPLANT
WATER STERILE IRR 1000ML POUR (IV SOLUTION) ×6 IMPLANT

## 2013-10-19 NOTE — CV Procedure (Signed)
Intra-operative Transesophageal Echocardiography Report:  [name] is a 68 year-old white male with hypertension, hyperlipidemia, and borderline type 2 diabetes mellitus but no previous history of coronary artery disease who presented with a six-month history of exertional chest discomfort.  He then  underwent a nuclear stress test which showed a large area of reversible ischemia. Cardiac catheterization revealed severe three-vessel coronary artery disease with normal left ventricular function. He is now scheduled to undergo coronary artery bypass grafting by Dr. Roxy Manns. Intraoperative transesophageal echocardiography was requested to evaluate the left and right ventricular function, determine if any valvular pathology was present, monitor for intraoperative air, and to serve as a monitor for intraoperative volume status.  The patient was brought to the operating room and [facility] and general anesthesia was induced without difficulty. Following endotracheal intubation and orogastric suctioning, the transesophageal echocardiography probe was inserted into the esophagus without difficulty.  Imression: Pre-bypass findings:  1. Aortic valve: Aortic valve was trileaflet and the leaflets were mildly thickened but opened normally.  There was no aortic insufficiency.  2. Mitral valve: There was moderate mitral annular calcification. The leaflets opened normally without obstruction and there was trace mitral insufficiency. There were no flail or prolapsing segments noted.   3. Left ventricle: There was mild left concentric left ventricular hypertrophy. Left ventricular wall thickness measured 1.05-1.10 cm at end diastole at the mid-papillary level in the transgastric short axis view. There was normal contractility in all segments interrogated and the ejection fraction was estimated at 55-60%. Left ventricular end-diastolic diameter measured 50 mm which is consistent with the upper limits of  normal in size.  4. A right ventricle: The right ventricular size was normal and there was normal contractility the right ventricular free wall and normal appearing right ventricular function.  5. Tricuspid valve: The tricuspid leaflets appeared structurally normal and there was 1+ tricuspid insufficiency  6. Interatrial septum: The interatrial septum was intact without evidence of patent foramen ovale or atrial septal defect by color Doppler or bubble study.  7. Left atrium: There was no thrombus noted within the left atrium or left atrial appendage  8. Ascending aorta: The  ascending aorta showed moderate intimal thickening without any severe atheromatous disease appreciated. There was a well-defined aortic root and sinotubular ridge without effacement.  9. Descending aorta: The descending aorta was not enlarged and measured 2.6 cm in diameter. There was grade 3-4 atheromatous disease scattered within the wall the descending aorta.  Post-bypass findings:  1. Aortic valve: The aortic valve was unchanged from the pre-bypass study. The leaflets opened normally and there was no aortic insufficiency.  2. Mitral valve: The mitral leaflets again opened normally without obstruction. There was trace mitral insufficiency.  3. Left ventricle: The left systolic function appeared normal with an ejection fraction estimated at 55-60%. There was mild concentric left ventricular hypertrophy which is unchanged in the pre-bypass study. There were no regional wall motion abnormalities noted.  4. Right ventricle: The right ventricular function appeared normal in the post-bypass views with normal contractility of the right ventricular free wall.  5. Tricuspid valve: The tricuspid leaflets appeared structurally and normal and there was trace tricuspid insufficiency.  Roberts Gaudy, M.D.

## 2013-10-19 NOTE — Procedures (Signed)
Extubation Procedure Note  Patient Details:   Name: PAGE LANCON DOB: 1946/07/02 MRN: [id number]   Airway Documentation:     Evaluation  O2 sats: stable throughout Complications: No apparent complications Patient did tolerate procedure well. Bilateral Breath Sounds: Clear   Yes, Pt. able to lift/hold head off bed on command, (+) cuff leak, NIF(-20), FVC-(.900cc), ABG drawn prior to extubation-WNL, placed on 2lpm n/c, I/S started by RN 750cc, RT to monitor.   Winferd Humphrey 10/19/2013, 5:01 PM

## 2013-10-19 NOTE — Progress Notes (Signed)
  Echocardiogram Echocardiogram Transesophageal has been performed.  Philipp Deputy 10/19/2013, 9:51 AM

## 2013-10-19 NOTE — Op Note (Addendum)
CARDIOTHORACIC SURGERY OPERATIVE NOTE  Date of Procedure: 10/19/2013  Preoperative Diagnosis: Severe 3-vessel Coronary Artery Disease  Postoperative Diagnosis: Same  Procedure:    Coronary Artery Bypass Grafting x 4   Left Internal Mammary Artery to Distal Left Anterior Descending Coronary Artery  Saphenous Vein Graft to Posterior Descending Coronary Artery  Saphenous Vein Graft to Intermediate Branch Coronary Artery with  Sequential Saphenous Vein Graft to Obtuse Marginal Branch of Left Circumflex Coronary Artery  Endoscopic Vein Harvest from Left Thigh  Surgeon: Valentina Gu. Roxy Manns, MD  Assistant: Nani Skillern, PA-C  Anesthesia: Roberts Gaudy, MD  Operative Findings: Normal LV function  Good quality LIMA conduit for grafting  Good quality SVG conduit for grafting  Diffuse CAD but relatively good quality target vessels for grafting    BRIEF CLINICAL NOTE AND INDICATIONS FOR SURGERY  Patient is a 68 year old white male from [place] with no previous history of coronary artery disease but risk factors notable for history of hypertension, hyperlipidemia, and borderline type 2 diabetes mellitus. The patient describes a six-month history of exertional chest discomfort consistent with classic stable angina pectoris. Approximate 6 months ago he noticed that while walking at a brisk pace or up an incline he would develop substernal chest pressure radiating across his chest. Oftentimes this would be associated with a feeling that he needed to belch. Symptoms always resolve with stopping to rest. Over the past 6 months the symptoms have remained essentially stable, and he denies any increase in frequency or severity. He has never had any chest discomfort at rest or at night. He underwent an echocardiogram demonstrating the presence of severe left ventricular hypertrophy with normal left ventricular systolic function, ejection fraction estimated 60-65%. There was no evidence for SAM or  dynamic left ventricular outflow tract obstruction. Nuclear stress Cardiolite exam was performed demonstrating a large area of reversible ischemia, and the patient had both symptoms and ST segment changes on EKG during peak stress. He subsequently underwent elective diagnostic cardiac catheterization by Dr. Angelena Form. This demonstrates severe three-vessel coronary artery disease with coronary anatomy relatively unfavorable for percutaneous coronary intervention. The patient has been seen in consultation and counseled at length regarding the indications, risks and potential benefits of surgery.  All questions have been answered, and the patient provides full informed consent for the operation as described.    DETAILS OF THE OPERATIVE PROCEDURE  Preparation:  The patient is brought to the operating room on the above mentioned date and central monitoring was established by the anesthesia team including placement of Swan-Ganz catheter and radial arterial line. The patient is placed in the supine position on the operating table.  Intravenous antibiotics are administered. General endotracheal anesthesia is induced uneventfully. A Foley catheter is placed.  Baseline transesophageal echocardiogram was performed.  Findings were notable for normal LV function.  There was mild LV hypertrophy.  The patient's chest, abdomen, both groins, and both lower extremities are prepared and draped in a sterile manner. A time out procedure is performed.   Surgical Approach and Conduit Harvest:  A median sternotomy incision was performed and the left internal mammary artery is dissected from the chest wall and prepared for bypass grafting. The left internal mammary artery is notably good quality conduit. Simultaneously, saphenous vein is obtained from the patient's left thigh using endoscopic vein harvest technique. Initially a small incision was made in the distal right thigh but the vein could not be localized.  The  saphenous vein removed from the left thigh is notably good  quality conduit. After removal of the saphenous vein, the small surgical incisions in the lower extremity are closed with absorbable suture. Following systemic heparinization, the left internal mammary artery was transected distally noted to have excellent flow.   Extracorporeal Cardiopulmonary Bypass and Myocardial Protection:  The pericardium is opened. The ascending aorta is normal in appearance. The ascending aorta and the right atrium are cannulated for cardioplegia bypass.  Adequate heparinization is verified.     The entire pre-bypass portion of the operation was notable for stable hemodynamics.  Cardiopulmonary bypass was begun and the surface of the heart is inspected. Distal target vessels are selected for coronary artery bypass grafting. A cardioplegia cannula is placed in the ascending aorta.  A temperature probe was placed in the interventricular septum.  The patient is allowed to cool passively to [facility] systemic temperature.  The aortic cross clamp is applied and cold blood cardioplegia is delivered initially in an antegrade fashion through the aortic root.  Iced saline slush is applied for topical hypothermia.  The initial cardioplegic arrest is rapid with early diastolic arrest.  Repeat doses of cardioplegia are administered intermittently throughout the entire cross clamp portion of the operation through the aortic root and through subsequently placed vein grafts in order to maintain completely flat electrocardiogram and septal myocardial temperature below 15C.  Myocardial protection was felt to be excellent.  Coronary Artery Bypass Grafting:   The posterior descending branch of the right coronary artery was grafted using a reversed saphenous vein graft in an end-to-side fashion.  At the site of distal anastomosis the target vessel was good quality and measured approximately 2.0 mm in diameter.  The ramus intermediate  branch coronary artery was grafted using a reversed saphenous vein graft in an side-to-side fashion.  At the site of distal anastomosis the target vessel was diffusely diseased and only fair quality, although it measured approximately 2.0 mm in diameter.  The obtuse marginal branch of the left circumflex coronary artery was grafted in and end-to-side fashion using a sequential saphenous vein graft off of the distal segment of the vein graft placed to the intermediate branch coronary artery.  At the site of distal anastomosis the target vessel was good quality and measured approximately 1.8 mm in diameter.  The distal left anterior coronary artery was grafted with the left internal mammary artery in an end-to-side fashion.  At the site of distal anastomosis the target vessel was diffusely diseased but fairly good quality and measured approximately 2.0 mm in diameter.  All proximal vein graft anastomoses were placed directly to the ascending aorta prior to removal of the aortic cross clamp.  The septal myocardial temperature rose rapidly after reperfusion of the left internal mammary artery graft.  The aortic cross clamp was removed after a total cross clamp time of 74 minutes.   Procedure Completion:  All proximal and distal coronary anastomoses were inspected for hemostasis and appropriate graft orientation. Epicardial pacing wires are fixed to the right ventricular outflow tract and to the right atrial appendage. The patient is rewarmed to 37C temperature. The patient is weaned and disconnected from cardiopulmonary bypass.  The patient's rhythm at separation from bypass was sinus.  The patient was weaned from cardioplegic bypass without any inotropic support. Total cardiopulmonary bypass time for the operation was 88 minutes.  Followup transesophageal echocardiogram performed after separation from bypass revealed no changes from the preoperative exam.  The aortic and venous cannula were removed  uneventfully. Protamine was administered to reverse the anticoagulation. The  mediastinum and pleural space were inspected for hemostasis and irrigated with saline solution. The mediastinum and both pleural spaces were drained using 4 chest tubes placed through separate stab incisions inferiorly.  The soft tissues anterior to the aorta were reapproximated loosely. The sternum is closed with double strength sternal wire. The soft tissues anterior to the sternum were closed in multiple layers and the skin is closed with a running subcuticular skin closure.  The post-bypass portion of the operation was notable for stable rhythm and hemodynamics.  No blood products were administered during the operation.   Disposition:  The patient tolerated the procedure well and is transported to the surgical intensive care in stable condition. There are no intraoperative complications. All sponge instrument and needle counts are verified correct at completion of the operation.    Valentina Gu. Roxy Manns MD 10/19/2013 12:10 PM

## 2013-10-19 NOTE — Addendum Note (Signed)
Addendum created 10/19/13 1734 by Roberts Gaudy, MD   Modules edited: Clinical Notes   Clinical Notes:  File: [id number]; [place]: [phone]; [place]: [phone]; [place]: [phone]

## 2013-10-19 NOTE — Anesthesia Procedure Notes (Signed)
Procedure Name: Intubation Date/Time: 10/19/2013 7:48 AM Performed by: Jacquiline Doe A Pre-anesthesia Checklist: Patient identified, Timeout performed, Emergency Drugs available, Suction available and Patient being monitored Patient Re-evaluated:Patient Re-evaluated prior to inductionOxygen Delivery Method: Circle system utilized Preoxygenation: Pre-oxygenation with 100% oxygen Intubation Type: IV induction and Cricoid Pressure applied Ventilation: Mask ventilation without difficulty and Oral airway inserted - appropriate to patient size Laryngoscope Size: Mac and 4 Grade View: Grade II Tube type: Oral Tube size: 7.5 mm Number of attempts: 1 Airway Equipment and Method: Stylet Placement Confirmation: ETT inserted through vocal cords under direct vision,  breath sounds checked- equal and bilateral and positive ETCO2 Secured at: 23 cm Tube secured with: Tape Dental Injury: Teeth and Oropharynx as per pre-operative assessment

## 2013-10-19 NOTE — Transfer of Care (Signed)
Immediate Anesthesia Transfer of Care Note  [name]  Procedure(s) Performed: Procedure(s) with comments: CORONARY ARTERY BYPASS GRAFTING (CABG) (N/A) - CABG times four using left internal mammary artery and left leg saphenous vein, incision made on right leg but no vein removed INTRAOPERATIVE TRANSESOPHAGEAL ECHOCARDIOGRAM (N/A)  Patient Location: PACU  Anesthesia Type:General  Level of Consciousness: sedated and Patient remains intubated per anesthesia plan  Airway & Oxygen Therapy: Patient remains intubated per anesthesia plan and Patient placed on Ventilator (see vital sign flow sheet for setting)  Post-op Assessment: Report given to SICU RN .  Post vital signs: Reviewed and stable  Complications: No apparent anesthesia complications

## 2013-10-19 NOTE — Anesthesia Postprocedure Evaluation (Signed)
  Anesthesia Post-op Note  Patient: ADIL TUGWELL  Procedure(s) Performed: Procedure(s) with comments: CORONARY ARTERY BYPASS GRAFTING (CABG) (N/A) - CABG times four using left internal mammary artery and left leg saphenous vein, incision made on right leg but no vein removed INTRAOPERATIVE TRANSESOPHAGEAL ECHOCARDIOGRAM (N/A)  Patient Location: SICU  Anesthesia Type:General  Level of Consciousness: Sedated on Vent  Airway and Oxygen Therapy: Patient remains intubated per anesthesia plan and Patient placed on Ventilator (see vital sign flow sheet for setting)  Post-op Pain: none  Post-op Assessment: Post-op Vital signs reviewed, Patient's Cardiovascular Status Stable, Respiratory Function Stable, Patent Airway and Pain level controlled  Post-op Vital Signs: stable  Complications: No apparent anesthesia complications

## 2013-10-19 NOTE — Anesthesia Preprocedure Evaluation (Signed)
Anesthesia Evaluation    Airway        Dental   Pulmonary Current Smoker,          Cardiovascular hypertension,     Neuro/Psych    GI/Hepatic   Endo/Other  diabetes  Renal/GU      Musculoskeletal   Abdominal   Peds  Hematology   Anesthesia Other Findings   Reproductive/Obstetrics                             Anesthesia Physical Anesthesia Plan  ASA: III  Anesthesia Plan:    Post-op Pain Management:    Induction:   Airway Management Planned:   Additional Equipment:   Intra-op Plan:   Post-operative Plan:   Informed Consent:   Plan Discussed with:   Anesthesia Plan Comments:         Anesthesia Quick Evaluation  

## 2013-10-19 NOTE — Brief Op Note (Addendum)
10/19/2013  10:46 AM  PATIENT:  Brandon [name]  68 y.o. male  PRE-OPERATIVE DIAGNOSIS:  CAD  POST-OPERATIVE DIAGNOSIS:  CAD  PROCEDURE: INTRAOPERATIVE TRANSESOPHAGEAL ECHOCARDIOGRAM, CORONARY ARTERY BYPASS GRAFTING (CABG) x 4 (LIMA to LAD, SVG SEQUENTIALLY to OM and RAMUS INTERMEDIATE, and SVG to PDA) with EVH from the left thigh.   SURGEON:    Rexene Alberts, MD  ASSISTANTS:  Nani Skillern, PA-C  ANESTHESIA:   Roberts Gaudy, MD  CROSSCLAMP TIME:   74'  CARDIOPULMONARY BYPASS TIME: 67'  FINDINGS:  Normal LV function  Good quality LIMA conduit for grafting  Good quality SVG conduit for grafting  Diffuse CAD but relatively good quality target vessels for grafting  COMPLICATIONS: None  BASELINE WEIGHT: 94 kg  PATIENT DISPOSITION:   TO SICU IN STABLE CONDITION  [name],[name] 10/19/2013 12:07 PM

## 2013-10-19 NOTE — Interval H&P Note (Signed)
History and Physical Interval Note:  10/19/2013 6:13 AM  Brandon [name]  has presented today for surgery, with the diagnosis of CAD  The various methods of treatment have been discussed with the patient and family. After consideration of risks, benefits and other options for treatment, the patient has consented to  Procedure(s): CORONARY ARTERY BYPASS GRAFTING (CABG) (N/A) INTRAOPERATIVE TRANSESOPHAGEAL ECHOCARDIOGRAM (N/A) as a surgical intervention .  The patient's history has been reviewed, patient examined, no change in status, stable for surgery.  I have reviewed the patient's chart and labs.  Questions were answered to the patient's satisfaction.     Yeimy Brabant H

## 2013-10-19 NOTE — OR Nursing (Signed)
0850 chest incision

## 2013-10-19 NOTE — Preoperative (Signed)
Beta Blockers   Reason not to administer Beta Blockers:Metoprolol PO@0600  ON 10/19/2013

## 2013-10-19 NOTE — Progress Notes (Addendum)
[facility] BRIEF SICU PROGRESS NOTE  Day of Surgery  S/P Procedure(s) (LRB): CORONARY ARTERY BYPASS GRAFTING (CABG) (N/A) INTRAOPERATIVE TRANSESOPHAGEAL ECHOCARDIOGRAM (N/A)   Extubated uneventfully NSR - AAI paced w/ stable hemodynamics on low dose Neo drip O2 sats 98% on O2 2 L/min via [place] UOP adequate Chest tube output low Labs okay  Plan: Continue routine early post op  Rexene Alberts 10/19/2013 6:34 PM

## 2013-10-20 ENCOUNTER — Inpatient Hospital Stay (HOSPITAL_COMMUNITY): Payer: Medicare Other

## 2013-10-20 LAB — GLUCOSE, CAPILLARY
Glucose-Capillary: 100 mg/dL — ABNORMAL HIGH (ref 70–99)
Glucose-Capillary: 109 mg/dL — ABNORMAL HIGH (ref 70–99)
Glucose-Capillary: 112 mg/dL — ABNORMAL HIGH (ref 70–99)
Glucose-Capillary: 114 mg/dL — ABNORMAL HIGH (ref 70–99)
Glucose-Capillary: 115 mg/dL — ABNORMAL HIGH (ref 70–99)
Glucose-Capillary: 120 mg/dL — ABNORMAL HIGH (ref 70–99)
Glucose-Capillary: 120 mg/dL — ABNORMAL HIGH (ref 70–99)
Glucose-Capillary: 121 mg/dL — ABNORMAL HIGH (ref 70–99)
Glucose-Capillary: 123 mg/dL — ABNORMAL HIGH (ref 70–99)
Glucose-Capillary: 123 mg/dL — ABNORMAL HIGH (ref 70–99)
Glucose-Capillary: 126 mg/dL — ABNORMAL HIGH (ref 70–99)
Glucose-Capillary: 127 mg/dL — ABNORMAL HIGH (ref 70–99)
Glucose-Capillary: 132 mg/dL — ABNORMAL HIGH (ref 70–99)
Glucose-Capillary: 87 mg/dL (ref 70–99)
Glucose-Capillary: 94 mg/dL (ref 70–99)
Glucose-Capillary: 99 mg/dL (ref 70–99)

## 2013-10-20 LAB — POCT I-STAT, CHEM 8
BUN: 16 mg/dL (ref 6–23)
Calcium, Ion: 1.16 mmol/L (ref 1.13–1.30)
Chloride: 101 mEq/L (ref 96–112)
Creatinine, Ser: 0.9 mg/dL (ref 0.50–1.35)
Glucose, Bld: 119 mg/dL — ABNORMAL HIGH (ref 70–99)
HCT: 38 % — ABNORMAL LOW (ref 39.0–52.0)
Hemoglobin: 12.9 g/dL — ABNORMAL LOW (ref 13.0–17.0)
Potassium: 3.7 mEq/L (ref 3.7–5.3)
Sodium: 138 mEq/L (ref 137–147)
TCO2: 25 mmol/L (ref 0–100)

## 2013-10-20 LAB — CBC
HCT: 38.8 % — ABNORMAL LOW (ref 39.0–52.0)
HCT: 37.7 % — ABNORMAL LOW (ref 39.0–52.0)
Hemoglobin: 13.2 g/dL (ref 13.0–17.0)
Hemoglobin: 12.5 g/dL — ABNORMAL LOW (ref 13.0–17.0)
MCH: 31.6 pg (ref 26.0–34.0)
MCH: 31.3 pg (ref 26.0–34.0)
MCHC: 34 g/dL (ref 30.0–36.0)
MCHC: 33.2 g/dL (ref 30.0–36.0)
MCV: 92.8 fL (ref 78.0–100.0)
MCV: 94.3 fL (ref 78.0–100.0)
Platelets: 134 10*3/uL — ABNORMAL LOW (ref 150–400)
Platelets: 135 10*3/uL — ABNORMAL LOW (ref 150–400)
RBC: 4.18 MIL/uL — ABNORMAL LOW (ref 4.22–5.81)
RBC: 4 MIL/uL — ABNORMAL LOW (ref 4.22–5.81)
RDW: 13.6 % (ref 11.5–15.5)
RDW: 13.8 % (ref 11.5–15.5)
WBC: 12.8 10*3/uL — ABNORMAL HIGH (ref 4.0–10.5)
WBC: 14.3 10*3/uL — ABNORMAL HIGH (ref 4.0–10.5)

## 2013-10-20 LAB — BASIC METABOLIC PANEL
BUN: 15 mg/dL (ref 6–23)
CO2: 22 mEq/L (ref 19–32)
Calcium: 7.9 mg/dL — ABNORMAL LOW (ref 8.4–10.5)
Chloride: 106 mEq/L (ref 96–112)
Creatinine, Ser: 0.7 mg/dL (ref 0.50–1.35)
GFR calc Af Amer: >90 mL/min (ref >90)
GFR calc non Af Amer: >90 mL/min (ref >90)
Glucose, Bld: 132 mg/dL — ABNORMAL HIGH (ref 70–99)
Potassium: 4 mEq/L (ref 3.7–5.3)
Sodium: 140 mEq/L (ref 137–147)

## 2013-10-20 LAB — POCT I-STAT 3, ART BLOOD GAS (G3+)
Acid-base deficit: 4 mmol/L — ABNORMAL HIGH (ref 0.0–2.0)
Bicarbonate: 22.2 mEq/L (ref 20.0–24.0)
O2 Saturation: 93 %
Patient temperature: 37.2
TCO2: 23 mmol/L (ref 0–100)
pCO2 arterial: 43 mmHg (ref 35.0–45.0)
pH, Arterial: 7.322 — ABNORMAL LOW (ref 7.350–7.450)
pO2, Arterial: 73 mmHg — ABNORMAL LOW (ref 80.0–100.0)

## 2013-10-20 LAB — MAGNESIUM
Magnesium: 2.5 mg/dL (ref 1.5–2.5)
Magnesium: 2.3 mg/dL (ref 1.5–2.5)

## 2013-10-20 LAB — CREATININE, SERUM
Creatinine, Ser: 0.77 mg/dL (ref 0.50–1.35)
GFR calc Af Amer: >90 mL/min (ref >90)
GFR calc non Af Amer: >90 mL/min (ref >90)

## 2013-10-20 MED ORDER — SODIUM CHLORIDE 0.9 % IV SOLN: 250.0000 mL | INTRAVENOUS | Status: DC | PRN

## 2013-10-20 MED ORDER — KETOROLAC TROMETHAMINE 15 MG/ML IJ SOLN
15.0000 mg | Freq: Four times a day (QID) | INTRAMUSCULAR | Status: AC
  Administered 2013-10-20 – 2013-10-21 (×4): 15 mg via INTRAVENOUS
  Filled 2013-10-20 (×5): qty 1

## 2013-10-20 MED ORDER — POTASSIUM CHLORIDE 10 MEQ/50ML IV SOLN
10.0000 meq | INTRAVENOUS | Status: AC
  Administered 2013-10-20 (×3): 10 meq via INTRAVENOUS
  Filled 2013-10-20 (×5): qty 50

## 2013-10-20 MED ORDER — TRAMADOL HCL 50 MG PO TABS
50.0000 mg | ORAL_TABLET | ORAL | Status: DC | PRN
  Administered 2013-10-20 – 2013-10-21 (×2): 50 mg via ORAL
  Filled 2013-10-20 (×2): qty 1

## 2013-10-20 MED ORDER — SIMVASTATIN 20 MG PO TABS
20.0000 mg | ORAL_TABLET | Freq: Every day | ORAL | Status: DC
  Administered 2013-10-20 – 2013-10-22 (×3): 20 mg via ORAL
  Filled 2013-10-20 (×4): qty 1

## 2013-10-20 MED ORDER — INSULIN ASPART 100 UNIT/ML ~~LOC~~ SOLN
0.0000 [IU] | SUBCUTANEOUS | Status: DC
  Administered 2013-10-20 – 2013-10-22 (×5): 2 [IU] via SUBCUTANEOUS

## 2013-10-20 MED ORDER — INSULIN DETEMIR 100 UNIT/ML ~~LOC~~ SOLN
20.0000 [IU] | Freq: Once | SUBCUTANEOUS | Status: AC
  Administered 2013-10-20: 20 [IU] via SUBCUTANEOUS
  Filled 2013-10-20: qty 0.2

## 2013-10-20 MED ORDER — SODIUM CHLORIDE 0.9 % IJ SOLN
3.0000 mL | Freq: Two times a day (BID) | INTRAMUSCULAR | Status: DC
  Administered 2013-10-20 – 2013-10-22 (×4): 3 mL via INTRAVENOUS

## 2013-10-20 MED ORDER — MORPHINE SULFATE 2 MG/ML IJ SOLN
2.0000 mg | INTRAMUSCULAR | Status: DC | PRN
  Administered 2013-10-20: 2 mg via INTRAVENOUS
  Filled 2013-10-20: qty 1

## 2013-10-20 MED ORDER — SODIUM CHLORIDE 0.9 % IJ SOLN: 3.0000 mL | INTRAMUSCULAR | Status: DC | PRN

## 2013-10-20 MED ORDER — MOVING RIGHT ALONG BOOK
Freq: Once | Status: AC
  Administered 2013-10-20: 1
  Filled 2013-10-20: qty 1

## 2013-10-20 MED ORDER — MIDAZOLAM HCL 2 MG/2ML IJ SOLN
2.0000 mg | Freq: Once | INTRAMUSCULAR | Status: AC
  Administered 2013-10-20: 2 mg via INTRAVENOUS
  Filled 2013-10-20: qty 2

## 2013-10-20 MED ORDER — FUROSEMIDE 10 MG/ML IJ SOLN
20.0000 mg | Freq: Four times a day (QID) | INTRAMUSCULAR | Status: AC
  Administered 2013-10-20 (×3): 20 mg via INTRAVENOUS
  Filled 2013-10-20 (×4): qty 2

## 2013-10-20 MED ORDER — FUROSEMIDE 40 MG PO TABS
40.0000 mg | ORAL_TABLET | Freq: Every day | ORAL | Status: AC
  Administered 2013-10-21 – 2013-10-23 (×3): 40 mg via ORAL
  Filled 2013-10-20 (×3): qty 1

## 2013-10-20 MED ORDER — ALLOPURINOL 300 MG PO TABS
300.0000 mg | ORAL_TABLET | Freq: Every day | ORAL | Status: DC
  Administered 2013-10-20 – 2013-10-23 (×4): 300 mg via ORAL
  Filled 2013-10-20 (×4): qty 1

## 2013-10-20 MED ORDER — POTASSIUM CHLORIDE CRYS ER 20 MEQ PO TBCR
20.0000 meq | EXTENDED_RELEASE_TABLET | Freq: Every day | ORAL | Status: AC
  Administered 2013-10-21 – 2013-10-23 (×3): 20 meq via ORAL
  Filled 2013-10-20 (×3): qty 1

## 2013-10-20 NOTE — Addendum Note (Signed)
Addendum created 10/20/13 1820 by Roberts Gaudy, MD   Modules edited: Clinical Notes   Clinical Notes:  File: [id number]; [place]: [phone]

## 2013-10-20 NOTE — Progress Notes (Signed)
RN called 2S after last run of potassium was done. 2S RN came over and pulled pts sleeve. RN held pressure for approximately 10 minutes. Pt instructed to lay flat in bed for thirty minutes and notify RN if he feels any bleeding or moisture. Pt verbalized understanding. Pt currently resting in bed. Will continue to monitor closely.  Coolidge Breeze, RN

## 2013-10-20 NOTE — Progress Notes (Signed)
      [address]       [place] [address]             [phone]        CARDIOTHORACIC SURGERY PROGRESS NOTE   R1 Day Post-Op Procedure(s) (LRB): CORONARY ARTERY BYPASS GRAFTING (CABG) (N/A) INTRAOPERATIVE TRANSESOPHAGEAL ECHOCARDIOGRAM (N/A)  Subjective: Looks good.  Mild soreness in chest.  No nausea.  No SOB  Objective: Vital signs: BP Readings from Last 1 Encounters:  10/20/13 118/72   Pulse Readings from Last 1 Encounters:  10/20/13 80   Resp Readings from Last 1 Encounters:  10/20/13 19   Temp Readings from Last 1 Encounters:  10/20/13 98.8 F (37.1 C)     Hemodynamics: PAP: (14-34)/(3-20) 32/12 mmHg CO:  [4.1 L/min-6.2 L/min] 6.2 L/min CI:  [1.8 L/min/m2-2.8 L/min/m2] 2.8 L/min/m2  Physical Exam:  Rhythm:   sinus  Breath sounds: clear  Heart sounds:  RRR  Incisions:  Dressings dry, intact  Abdomen:  Soft, non-distended, non-tender  Extremities:  Warm, well-perfused    Intake/Output from previous day: 03/06 0701 - 03/07 0700 In: 5462.7 [P.O.:200; I.V.:6183.2; Blood:1060; IV Piggyback:1250] Out: 4860 [Urine:2635; Blood:1625; Chest Tube:600] Intake/Output this shift: Total I/O In: 205.7 [P.O.:120; I.V.:85.7] Out: 185 [Urine:110; Chest Tube:75]  Lab Results:  CBC: Recent Labs  10/19/13 1850 10/20/13 0405  WBC 12.8* 12.8*  HGB 13.8  13.6 13.2  HCT 40.6  40.0 38.8*  PLT 164 134*    BMET:  Recent Labs  10/18/13 1434  10/19/13 1850 10/20/13 0405  NA 136*  < > 141 140  K 3.5*  < > 3.7 4.0  CL 98  --  107 106  CO2 21  --   --  22  GLUCOSE 129*  < > 140* 132*  BUN 19  --  12 15  CREATININE 1.00  --  0.69  0.70 0.70  CALCIUM 9.4  --   --  7.9*  < > = values in this interval not displayed.   CBG (last 3)   Recent Labs  10/20/13 0458 10/20/13 0604 10/20/13 0653  GLUCAP 121* 120* 115*    ABG    Component Value Date/Time   PHART 7.322* 10/20/2013 0401   PCO2ART 43.0 10/20/2013 0401   PO2ART 73.0* 10/20/2013  0401   HCO3 22.2 10/20/2013 0401   TCO2 23 10/20/2013 0401   ACIDBASEDEF 4.0* 10/20/2013 0401   O2SAT 93.0 10/20/2013 0401    CXR: PORTABLE CHEST - 1 VIEW  COMPARISON: DG CHEST 1V PORT dated 10/19/2013; DG CHEST 2 VIEW dated  10/09/2013  FINDINGS:  Endotracheal and nasogastric tubes have been removed.  Swan-Ganz catheter tip remains in the right pulmonary artery. Stable  mild cardiomegaly with atherosclerotic calcification of the aortic  arch and mild aortic tortuosity.  Mild perihilar atelectasis appears improved. No overt edema. No  pneumothorax.  IMPRESSION:  1. Endotracheal and nasogastric tubes have been removed.  2. Improved aeration bilaterally.  Electronically Signed  By: Sherryl Barters M.D.  On: 10/20/2013 08:17    Assessment/Plan: S/P Procedure(s) (LRB): CORONARY ARTERY BYPASS GRAFTING (CABG) (N/A) INTRAOPERATIVE TRANSESOPHAGEAL ECHOCARDIOGRAM (N/A)  Doing well POD1 Maintaining NSR w/ stable hemodynamics on no drips Expected post op acute blood loss anemia, very mild Expected post op volume excess, mild Expected post op atelectasis, mild   Mobilize  D/C tubes and lines  Diuresis   [name],[name] 10/20/2013 10:13 AM

## 2013-10-20 NOTE — Progress Notes (Signed)
Pt transferred to 2W23 via wheelchair. Pt tolerated transfer well. Pt's wife present. VSS. No complaints from pt. Pt's belongings transferred with him.   Pt's sleeve left in for potassium replacement-to be removed after replacement finished. Dr. Roxy Manns aware.  Fulton Reek, RN

## 2013-10-20 NOTE — Progress Notes (Signed)
Anesthesiology Follow-up:  Awake and alert, sitting up in bed with family present. Moved to 2W this afternoon.   VS: T 36.4 BP 106/64 RR 16 HR 72 (SR) O2 Sat 94%  K-3.7 glucose 123 BUN/Cr 16/0.90 H/H 12.9/38 Plts 22,66  68 year old male, one day  S/P CABG X 4. Extubated 4 hours post-op. Doing well.  Roberts Gaudy , MD

## 2013-10-21 ENCOUNTER — Inpatient Hospital Stay (HOSPITAL_COMMUNITY): Payer: Medicare Other

## 2013-10-21 LAB — CBC
HCT: 35.7 % — ABNORMAL LOW (ref 39.0–52.0)
Hemoglobin: 11.8 g/dL — ABNORMAL LOW (ref 13.0–17.0)
MCH: 31.3 pg (ref 26.0–34.0)
MCHC: 33.1 g/dL (ref 30.0–36.0)
MCV: 94.7 fL (ref 78.0–100.0)
Platelets: 120 10*3/uL — ABNORMAL LOW (ref 150–400)
RBC: 3.77 MIL/uL — ABNORMAL LOW (ref 4.22–5.81)
RDW: 13.8 % (ref 11.5–15.5)
WBC: 12.9 10*3/uL — ABNORMAL HIGH (ref 4.0–10.5)

## 2013-10-21 LAB — BASIC METABOLIC PANEL
BUN: 19 mg/dL (ref 6–23)
CO2: 28 mEq/L (ref 19–32)
Calcium: 8.2 mg/dL — ABNORMAL LOW (ref 8.4–10.5)
Chloride: 97 mEq/L (ref 96–112)
Creatinine, Ser: 0.77 mg/dL (ref 0.50–1.35)
GFR calc Af Amer: >90 mL/min (ref >90)
GFR calc non Af Amer: >90 mL/min (ref >90)
Glucose, Bld: 111 mg/dL — ABNORMAL HIGH (ref 70–99)
Potassium: 4.1 mEq/L (ref 3.7–5.3)
Sodium: 133 mEq/L — ABNORMAL LOW (ref 137–147)

## 2013-10-21 LAB — GLUCOSE, CAPILLARY
Glucose-Capillary: 122 mg/dL — ABNORMAL HIGH (ref 70–99)
Glucose-Capillary: 133 mg/dL — ABNORMAL HIGH (ref 70–99)
Glucose-Capillary: 127 mg/dL — ABNORMAL HIGH (ref 70–99)
Glucose-Capillary: 126 mg/dL — ABNORMAL HIGH (ref 70–99)
Glucose-Capillary: 116 mg/dL — ABNORMAL HIGH (ref 70–99)

## 2013-10-21 MED ORDER — AMIODARONE IV BOLUS ONLY 150 MG/100ML
150.0000 mg | Freq: Once | INTRAVENOUS | Status: AC
  Administered 2013-10-21: 150 mg via INTRAVENOUS
  Filled 2013-10-21: qty 100

## 2013-10-21 MED ORDER — AMIODARONE HCL 200 MG PO TABS
200.0000 mg | ORAL_TABLET | Freq: Two times a day (BID) | ORAL | Status: DC
  Administered 2013-10-21 – 2013-10-23 (×5): 200 mg via ORAL
  Filled 2013-10-21 (×6): qty 1

## 2013-10-21 NOTE — Progress Notes (Signed)
Pt family member requested sleep medication for pt. RN paged MD. No new orders given. MD stated it was too early for sleeping agent. Will continue to monitor.   Coolidge Breeze, RN

## 2013-10-21 NOTE — Progress Notes (Signed)
Pt ambulating independently in hall with family.

## 2013-10-21 NOTE — Progress Notes (Signed)
Pt went up to bathroom and converted back into a-fib. Pts heart rate went up to 146. RN paged Dr. Roxy Manns. Orders given for a 150 mg bolus of amiodarone. Will continue to monitor.   Coolidge Breeze, RN

## 2013-10-21 NOTE — Progress Notes (Addendum)
[address]       ,[place] [address]             [phone]          2 Days Post-Op Procedure(s) (LRB): CORONARY ARTERY BYPASS GRAFTING (CABG) (N/A) INTRAOPERATIVE TRANSESOPHAGEAL ECHOCARDIOGRAM (N/A)  Subjective: In an out of AF overnight. Pt asymptomatic.  Some nausea earlier, but feels better after eating breakfast.   Objective: Vital signs in last 24 hours: Patient Vitals for the past 24 hrs:  BP Temp Temp src Pulse Resp SpO2 Weight  10/21/13 0622 105/68 mmHg 98.9 F (37.2 C) Oral 75 - 93 % -  10/21/13 0500 - - - - - - 214 lb 8 oz (97.297 kg)  10/21/13 0053 99/68 mmHg - - 123 - 92 % -  10/20/13 2100 104/61 mmHg 98.4 F (36.9 C) Oral 72 - 91 % -  10/20/13 1648 105/64 mmHg 97.6 F (36.4 C) Oral 60 16 94 % -  10/20/13 1600 104/64 mmHg - - 72 15 97 % -  10/20/13 1535 - 97.7 F (36.5 C) Oral - - - -  10/20/13 1500 104/67 mmHg - - 69 16 97 % -  10/20/13 1431 - - - - - 94 % -  10/20/13 1400 100/70 mmHg - - 72 19 99 % -  10/20/13 1300 - - - 87 21 97 % -  10/20/13 1200 113/70 mmHg - - 80 21 99 % -  10/20/13 1137 - 98 F (36.7 C) Oral - - - -  10/20/13 1100 119/72 mmHg - - 79 13 97 % -  10/20/13 1030 - 98.8 F (37.1 C) - 76 16 98 % -  10/20/13 1000 115/68 mmHg 98.8 F (37.1 C) Core 80 16 97 % -  10/20/13 0900 - 98.8 F (37.1 C) - 80 19 97 % -   Current Weight  10/21/13 214 lb 8 oz (97.297 kg)  BASELINE WEIGHT: 94 kg    Intake/Output from previous day: 03/07 0701 - 03/08 0700 In: 853.4 [P.O.:480; I.V.:273.4; IV Piggyback:100] Out: 1720 [Urine:1605; Chest Tube:115]  CBGs 132-133-122-111     PHYSICAL EXAM:  Heart: Irr irr, rates around 100 Lungs: Clear Wound: Clean and dry Extremities: Mild LE edema    Lab Results: CBC: Recent Labs  10/20/13 1510 10/20/13 1512 10/21/13 0520  WBC 14.3*  --  12.9*  HGB 12.5* 12.9* 11.8*  HCT 37.7* 38.0* 35.7*  PLT 135*  --  120*   BMET:  Recent Labs  10/20/13 0405  10/20/13 1512  10/21/13 0520  NA 140  --  138 133*  K 4.0  --  3.7 4.1  CL 106  --  101 97  CO2 22  --   --  28  GLUCOSE 132*  --  119* 111*  BUN 15  --  16 19  CREATININE 0.70  < > 0.90 0.77  CALCIUM 7.9*  --   --  8.2*  < > = values in this interval not displayed.  PT/INR:  Recent Labs  10/19/13 1300  LABPROT 15.6*  INR 1.27   CXR: small L pleural effusion/atx   Assessment/Plan: S/P Procedure(s) (LRB): CORONARY ARTERY BYPASS GRAFTING (CABG) (N/A) INTRAOPERATIVE TRANSESOPHAGEAL ECHOCARDIOGRAM (N/A)  CV- Intermittent AF overnight, currently rates around 100.  SBPs borderline 90-100, so I am hesitant to increase Lopressor, and he only has peripheral IV access, so I do not want to start IV Amio.  Will start po Amio and  watch.  Vol overload- diurese.  Elevated CBGs (A1C=5.9) - stable, will decrease Levemir, continue SSI and watch.  CRPI, pulm toilet.   LOS: 2 days    COLLINS,GINA H 10/21/2013  I have seen and examined the patient and agree with the assessment and plan as outlined.  [name],[name] 10/21/2013 11:57 AM

## 2013-10-22 LAB — GLUCOSE, CAPILLARY
Glucose-Capillary: 114 mg/dL — ABNORMAL HIGH (ref 70–99)
Glucose-Capillary: 134 mg/dL — ABNORMAL HIGH (ref 70–99)
Glucose-Capillary: 104 mg/dL — ABNORMAL HIGH (ref 70–99)

## 2013-10-22 MED ORDER — METOPROLOL TARTRATE 25 MG PO TABS
25.0000 mg | ORAL_TABLET | Freq: Two times a day (BID) | ORAL | Status: DC
  Administered 2013-10-22 – 2013-10-23 (×2): 25 mg via ORAL
  Filled 2013-10-22 (×3): qty 1

## 2013-10-22 MED ORDER — METOPROLOL TARTRATE 25 MG PO TABS: 25.0000 mg | ORAL_TABLET | Freq: Two times a day (BID) | ORAL | Status: DC

## 2013-10-22 MED ORDER — POTASSIUM CHLORIDE CRYS ER 10 MEQ PO TBCR: 10.0000 meq | EXTENDED_RELEASE_TABLET | Freq: Every day | ORAL | Status: DC

## 2013-10-22 MED ORDER — METOPROLOL TARTRATE 25 MG PO TABS
25.0000 mg | ORAL_TABLET | Freq: Two times a day (BID) | ORAL | Status: DC
  Administered 2013-10-22: 25 mg via ORAL
  Filled 2013-10-22 (×2): qty 1

## 2013-10-22 MED ORDER — ASPIRIN EC 325 MG PO TBEC
325.0000 mg | DELAYED_RELEASE_TABLET | Freq: Every day | ORAL | Status: DC
  Administered 2013-10-22 – 2013-10-23 (×2): 325 mg via ORAL
  Filled 2013-10-22 (×2): qty 1

## 2013-10-22 MED ORDER — HYDROCHLOROTHIAZIDE 50 MG PO TABS: 50.0000 mg | ORAL_TABLET | Freq: Every day | ORAL | Status: DC

## 2013-10-22 MED FILL — Potassium Chloride Inj 2 mEq/ML: INTRAVENOUS | Qty: 40 | Status: AC

## 2013-10-22 MED FILL — Heparin Sodium (Porcine) Inj 1000 Unit/ML: INTRAMUSCULAR | Qty: 30 | Status: AC

## 2013-10-22 MED FILL — Magnesium Sulfate Inj 50%: INTRAMUSCULAR | Qty: 10 | Status: AC

## 2013-10-22 NOTE — Progress Notes (Signed)
Removed epicardial wires per order. 3 intact.  Pt tolerated procedure well.  Pt instructed to remain on bedrest for one hour.  Frequent vitals will be taken and documented. Pt resting with call bell within reach. Fredrica Capano McClintock   

## 2013-10-22 NOTE — Progress Notes (Signed)
CARDIAC REHAB PHASE I   PRE:  Rate/Rhythm: 82SR  BP:  Supine:   Sitting: 124/78  Standing:    SaO2: 94%RA  MODE:  Ambulation: 950 ft   POST:  Rate/Rhythm: 92SR  BP:  Supine:   Sitting: 164/90  Standing:    SaO2: 95%RA 0825-0847 Pt walked 950 ft on RA with rolling walker and asst x 1 with steady gait. Does not need walker for home. BP elevated after walk but otherwise tolerated well. To recliner after walk.   Graylon Good, RN BSN  10/22/2013 8:43 AM

## 2013-10-22 NOTE — Discharge Instructions (Signed)
Activity: 1.May walk up steps °               2.No lifting more than ten pounds for four weeks.  °               3.No driving for four weeks. °               4.Stop any activity that causes chest pain, shortness of breath, dizziness,                            sweating or excessive weakness. °               5.Avoid straining. °               6.Continue with your breathing exercises daily. ° °Diet: Diabetic diet and Low fat, Low salt  diet ° °Wound Care: May shower.  Clean wounds with mild soap and water daily. Contact the office at [phone] if any problems arise. ° °Coronary Artery Bypass Grafting, Care After °Refer to this sheet in the next few weeks. These instructions provide you with information on caring for yourself after your procedure. Your health care provider may also give you more specific instructions. Your treatment has been planned according to current medical practices, but problems sometimes occur. Call your health care provider if you have any problems or questions after your procedure. °WHAT TO EXPECT AFTER THE PROCEDURE °Recovery from surgery will be different for everyone. Some people feel well after 3 or 4 weeks, while for others it takes longer. After your procedure, it is typical to have the following: °· Nausea and a lack of appetite.   °· Constipation. °· Weakness and fatigue.   °· Depression or irritability.   °· Pain or discomfort at your incision site. °HOME CARE INSTRUCTIONS °· Only take over-the-counter or prescription medicines as directed by your health care provider. Take all medicines exactly as directed. Do not stop taking medicines or start any new medicines without first checking with your health care provider.   °· Take your pulse as directed by your health care provider. °· Perform deep breathing as directed by your health care provider. If you were given a device called an incentive spirometer, use it to practice deep breathing several times a day. Support your chest with  a pillow or your arms when you take deep breaths or cough. °· Keep incision areas clean, dry, and protected. Remove or change any bandages (dressings) only as directed by your health care provider. You may have skin adhesive strips over the incision areas. Do not take the strips off. They will fall off on their own. °· Check incision areas daily for any swelling, redness, or drainage. °· If incisions were made in your legs, do the following: °· Avoid crossing your legs.   °· Avoid sitting for long periods of time. Change positions every 30 minutes.   °· Elevate your legs when you are sitting.   °· Wear compression stockings as directed by your health care provider. These stockings help keep blood clots from forming in your legs. °· Take showers once your health care provider approves. Until then, only take sponge baths. Pat incisions dry. Do not rub incisions with a washcloth or towel. Do not take tub baths or go swimming until your health care provider approves. °· Eat foods that are high in fiber, such as raw fruits and vegetables, whole grains, beans, and nuts. Meats should be lean cut. Avoid   canned, processed, and fried foods. °· Drink enough fluids to keep your urine clear or pale yellow. °· Weigh yourself every day. This helps identify if you are retaining fluid that may make your heart and lungs work harder.   °· Rest and limit activity as directed by your health care provider. You may be instructed to: °· Stop any activity at once if you have chest pain, shortness of breath, irregular heartbeats, or dizziness. Get help right away if you have any of these symptoms. °· Move around frequently for short periods or take short walks as directed by your health care provider. Increase your activities gradually. You may need physical therapy or cardiac rehabilitation to help strengthen your muscles and build your endurance. °· Avoid lifting, pushing, or pulling anything heavier than 10 lb (4.5 kg) for at least 6  weeks after surgery. °· Do not drive until your health care provider approves.  °· Ask your health care provider when you may return to work and resume sexual activity. °· Follow up with your health care provider as directed.   °SEEK MEDICAL CARE IF: °· You have swelling, redness, increasing pain, or drainage at the site of an incision.   °· You develop a fever.   °· You have swelling in your ankles or legs.   °· You have pain in your legs.   °· You have weight gain of 2 or more pounds a day. °· You are nauseous or vomit. °· You have diarrhea.  °SEEK IMMEDIATE MEDICAL CARE IF: °· You have chest pain that goes to your jaw or arms. °· You have shortness of breath.   °· You have a fast or irregular heartbeat.   °· You notice a "clicking" in your breastbone (sternum) when you move.   °· You have numbness or weakness in your arms or legs. °· You feel dizzy or lightheaded.   °MAKE SURE YOU: °· Understand these instructions. °· Will watch your condition. °· Will get help right away if you are not doing well or get worse. °Document Released: 02/19/2005 Document Revised: 04/04/2013 Document Reviewed: 01/09/2013 °[employer]® Patient Information ©2014 [employer]. ° ° ° °

## 2013-10-22 NOTE — Progress Notes (Addendum)
      [address]       [place] [address]             [phone]        3 Days Post-Op Procedure(s) (LRB): CORONARY ARTERY BYPASS GRAFTING (CABG) (N/A) INTRAOPERATIVE TRANSESOPHAGEAL ECHOCARDIOGRAM (N/A)  Subjective: Patient's only complaint is incisional pain.  Objective: Vital signs in last 24 hours: Temp:  [97.9 F (36.6 C)-98.8 F (37.1 C)] 98.8 F (37.1 C) (03/09 0504) Pulse Rate:  [73-82] 77 (03/09 0504) Cardiac Rhythm:  [-] Normal sinus rhythm (03/08 2037) Resp:  [18-20] 20 (03/09 0504) BP: (116-133)/(68-80) 133/80 mmHg (03/09 0504) SpO2:  [95 %-97 %] 97 % (03/09 0504) Weight:  [96.8 kg (213 lb 6.5 oz)] 96.8 kg (213 lb 6.5 oz) (03/09 0500)  Pre op weight 94 kg Current Weight  10/22/13 96.8 kg (213 lb 6.5 oz)      Intake/Output from previous day: 03/08 0701 - 03/09 0700 In: 240 [P.O.:240] Out: -    Physical Exam:  Cardiovascular: RRR, no murmurs, gallops, or rubs. Pulmonary: Clear to auscultation bilaterally; no rales, wheezes, or rhonchi. Abdomen: Soft, non tender, bowel sounds present. Extremities: Trace bilateral lower extremity edema. Wounds: Clean and dry.  No erythema or signs of infection.  Lab Results: CBC: Recent Labs  10/20/13 1510 10/20/13 1512 10/21/13 0520  WBC 14.3*  --  12.9*  HGB 12.5* 12.9* 11.8*  HCT 37.7* 38.0* 35.7*  PLT 135*  --  120*   BMET:  Recent Labs  10/20/13 0405  10/20/13 1512 10/21/13 0520  NA 140  --  138 133*  K 4.0  --  3.7 4.1  CL 106  --  101 97  CO2 22  --   --  28  GLUCOSE 132*  --  119* 111*  BUN 15  --  16 19  CREATININE 0.70  < > 0.90 0.77  CALCIUM 7.9*  --   --  8.2*  < > = values in this interval not displayed.  PT/INR:  Lab Results  Component Value Date   INR 1.27 10/19/2013   INR 0.85 10/18/2013   INR 0.91 10/09/2013   ABG:  INR: Will add last result for INR, ABG once components are confirmed Will add last 4 CBG results once components are  confirmed  Assessment/Plan:  1. CV - PAF over weekend.SR in the 80's this am. On Amiodarone 200 bid and Lopressor 12.5 bid 2.  Pulmonary - Encourage incentive spirometer 3. Volume Overload - On Lasix 40 daily 4.  Acute blood loss anemia - Last H and H 11.8 and 35.7 5. CBGs 114/134/104. Pre op HGA1C 5.9. He is  pre diabetic. Will need continued follow up with medical doctor. 6. Mild thrombocytopenia-last platelet count 120,000 7. Remove EPW in am 8. Possible discharge in am  ZIMMERMAN,DONIELLE MPA-C 10/22/2013,7:56 AM  I have seen and examined the patient and agree with the assessment and plan as outlined.  [name],[name] 10/22/2013 9:35 AM

## 2013-10-22 NOTE — Discharge Summary (Signed)
Physician Discharge Summary       [facility]       [place] [address]             [phone]    Patient ID: Brandon [name] MRN: [id number] DOB/AGE: 12/08/1945 68 y.o.  Admit date: 10/19/2013 Discharge date: 10/23/2013  Admission Diagnoses: 1. Multivessel CAD 2. History of mixed hyperlipidemia 3. History of hypertension 4. History of borderline type II DM 5.History of tobacco abuse 6. History of GERD 7. History DDD  Discharge Diagnoses:  1. Multivessel CAD 2. History of mixed hyperlipidemia 3. History of hypertension 4. History of borderline type II DM 5.History of tobacco abuse 6. History of GERD 7. History DDD 8. Mild thrombocytopenia 9. ABL anemia 10. Post op a fib (converted to sinus rhythm)  Procedure (s):  Coronary Artery Bypass Grafting x 4  Left Internal Mammary Artery to Distal Left Anterior Descending Coronary Artery aphenous Vein Graft to Posterior Descending Coronary Artery  Saphenous Vein Graft to Intermediate Branch Coronary Artery with Sequential Saphenous Vein Graft to Obtuse Marginal Branch of Left Circumflex Coronary Artery  Endoscopic Vein Harvest from Left Thigh by Dr. Roxy Manns on 10/19/2013.  History of Presenting Illness: This is a 68 year old white male from [place] with no previous history of coronary artery disease but risk factors notable for history of hypertension, hyperlipidemia, and borderline type 2 diabetes mellitus. The patient describes a six-month history of exertional chest discomfort consistent with classic stable angina pectoris. Approximately 6 months ago, he noticed that while walking at a brisk pace or up an incline he would develop substernal chest pressure radiating across his chest. Oftentimes this would be associated with a feeling that he needed to belch. Symptoms always resolve with stopping to rest. Over the past 6 months the symptoms have remained essentially stable, and he denies any increase in frequency or  severity. He has never had any chest discomfort at rest or at night. He underwent an echocardiogram demonstrating the presence of severe left ventricular hypertrophy with normal left ventricular systolic function, ejection fraction estimated 60-65%. There was no evidence for SAM or dynamic left ventricular outflow tract obstruction. Nuclear stress Cardiolite exam was performed demonstrating a large area of reversible ischemia, and the patient had both symptoms and ST segment changes on EKG during peak stress. He subsequently underwent elective diagnostic cardiac catheterization by Dr. Angelena Form. This demonstrates severe three-vessel coronary artery disease with coronary anatomy relatively unfavorable for percutaneous coronary intervention. The patient was referred for surgical consultation. He was originally seen in consultation following his cardiac catheterization on 10/09/2013. He returns to the office today with plans to proceed with elective coronary artery bypass grafting tomorrow.  The patient lives with his wife and works full-time in a Systems [employer]. This requires fairly strenuous physical activity. He remains physically active otherwise and has not had other significant physical limitations. He describes a six-month history of exertional chest pain as noted. He denies any history of nocturnal chest pain or shortness of breath. He denies any episodes of chest pain at rest. He denies any history of exertional shortness of breath, PND, orthopnea, or lower extremity edema. He has had some dizzy spells and near syncope in the remote past, but nothing recently. Dr. Roxy Manns discussed the need for coronary artery bypass grafting surgery. Potential risks, benefits, and complications were discussed with the patient and he agreed to proceed with surgery.Pre operative carotid duplex US showed bilateral 1-39% ICA stenosis. He underwent a CABG x 4 on  10/19/2013.  Brief Hospital Course:  The patient was extubated the  evening of surgery without difficulty. He remained afebrile and hemodynamically stable. Gordy Councilman, a line, chest tubes, and foley were removed early in the post operative course. Lopressor was started and titrated accordingly.  He did have PAF post op. He was put on Amiodarone. He was volume over loaded and diuresed. He  was weaned off the insulin drip. The patient's HGA1C pre op was 5.9.He will need continued surveillance with medical doctor after discharge.Marland KitchenHe was surgically stable for transfer from the ICU to [facility] for further convalescence on 10/20/2013. He continues to progress with cardiac rehab. He was ambulating on room air. He has been tolerating a diet and has had a bowel movement. Epicardial pacing wires and chest tube sutures will be removed prior to discharge. Provided the patient remains afebrile, hemodynamically stable, and pending morning round evaluation, He/she will be surgically stable for discharge on 10/23/2012.    Latest Vital Signs: Blood pressure 141/82, pulse 68, temperature 98.5 F (36.9 C), temperature source Oral, resp. rate 18, height 6\' 1"  (1.854 m), weight 94.6 kg (208 lb 8.9 oz), SpO2 97.00%.  Physical Exam: Cardiovascular: RRR, no murmurs, gallops, or rubs.  Pulmonary: Clear to auscultation bilaterally; no rales, wheezes, or rhonchi.  Abdomen: Soft, non tender, bowel sounds present.  Extremities: Trace bilateral lower extremity edema.  Wounds: Clean and dry. No erythema or signs of infection.   Discharge Condition:Stable  Recent laboratory studies:  Lab Results  Component Value Date   WBC 12.9* 10/21/2013   HGB 11.8* 10/21/2013   HCT 35.7* 10/21/2013   MCV 94.7 10/21/2013   PLT 120* 10/21/2013   Lab Results  Component Value Date   NA 133* 10/21/2013   K 4.1 10/21/2013   CL 97 10/21/2013   CO2 28 10/21/2013   CREATININE 0.77 10/21/2013   GLUCOSE 111* 10/21/2013      Diagnostic Studies: Dg Chest 2 View  10/21/2013   CLINICAL DATA:  Status post CABG.  EXAM: CHEST - 2  VIEW  COMPARISON:  DG CHEST 1V PORT dated 10/20/2013; DG CHEST 1V PORT dated 10/19/2013; DG CHEST 2 VIEW dated 10/09/2013  FINDINGS: Swan-Ganz catheter, mediastinal drain and right chest tube have been removed. There may be a tiny amount of air in the right minor fissure. No significant pneumothorax is present. The heart size and mediastinal contours are stable. Lungs show mild bilateral lower lobe atelectasis, left greater than right. There are small bilateral pleural effusions.  IMPRESSION: Bilateral lower lobe atelectasis, left greater than right. Small bilateral pleural effusions. No significant pneumothorax. Probable tiny amount of air is visualized in the minor fissure on the right.   Electronically Signed   By: Aletta Edouard M.D.   On: 10/21/2013 08:28   Dg Chest 2 View  10/09/2013   CLINICAL DATA:  Chest pain.  EXAM: CHEST  2 VIEW  COMPARISON:  Chest radiograph performed 11/24/2011  FINDINGS: The lungs are well-aerated and clear. There is no evidence of focal opacification, pleural effusion or pneumothorax.  The heart is normal in size; the mediastinal contour is within normal limits. No acute osseous abnormalities are seen.  IMPRESSION: No acute cardiopulmonary process seen.   Electronically Signed   By: Garald Balding M.D.   On: 10/09/2013 23:40   Nm Myocar Single W/spect W/wall Motion And Ef  10/01/2013   CLINICAL DATA:  The patient is a 68 year old male with a history of hypertension and hyperlipidemia who has been experiencing exertional  indigestion and chest tightness. He is referred for an ischemic evaluation.  EXAM: MYOCARDIAL IMAGING WITH SPECT (REST AND EXERCISE)  GATED LEFT VENTRICULAR WALL MOTION STUDY  LEFT VENTRICULAR EJECTION FRACTION  TECHNIQUE: Standard myocardial SPECT imaging was performed after resting intravenous injection of 10 mCi Tc-57m sestamibi. Subsequently, exercise tolerance test was performed by the patient under the supervision of the Cardiology staff. At peak-stress, 30 mCi  Tc-36m sestamibi was injected intravenously and standard myocardial SPECT imaging was performed. Quantitative gated imaging was also performed to evaluate left ventricular wall motion, and estimate left ventricular ejection fraction.  COMPARISON:  None.  FINDINGS: The patient was stressed according to the Bruce protocol for 6 min and 39 seconds, achieving a work level of 9 Mets. The resting heart rate of 51 beats per min rose to a maximal heart rate of 121 beats per min. This value represents 79% of the maximal, age predicted heart rate. The resting blood pressure of 146/97 rose to 213/103. The stress test was stopped due to chest discomfort and dynamic ST segment changes. Sublingual nitroglycerin was administered to the patient with alleviation of symptoms.  The resting ECG shows sinus bradycardia, 52 beats per min. With stress, there were nonspecific ST segment changes in the inferior leads, less than 1 mm horizontal ST segment depression in V4, and 1.5 mm horizontal and then downsloping ST segment depressions in V5-6. PAC's and isolated PVC's were also noted.  Analysis of the raw data showed no significant extracardiac radiotracer uptake. Analysis of the perfusion stress images showed moderate sized, moderate to severe in intensity, apical anterior, apical anteroseptal, apical inferior, and inferoseptal defects with normalized perfusion on the resting images. This is indicative of a significant degree of ischemia in the LAD distribution. There is also a moderate-sized, mild to moderate intensity, fixed mid inferior wall defect, indicative of myocardial scar with some degree of soft tissue attenuation. The reversible abnormalities demonstrated hypokinesis. Left ventricular systolic function was normal, calculated LV EF 57%. The summed difference score was 9.  IMPRESSION: 1. Abnormal exercise Cardiolite stress test with complaints of chest pain reported, relieved with SL nitroglycerin.  2. A large area of ischemia  is noted in the apical and inferoseptal walls.  3. Myocardial scar with overlying soft tissue attenuation is noted in the mid-inferior wall.  4. Left ventricular systolic function is normal, calculated LVEF 57%. The ischemic regions demonstrated hypokinesis.  5.  Significant ST depressions also noted as detailed above.  6. This represents a high-risk study for future cardiovascular events.   Electronically Signed   By: Kate Sable   On: 10/01/2013 18:57     Future Appointments Provider Department Dept Phone   11/09/2013 11:40 AM Satira Sark, MD [place] [phone]   11/12/2013 3:00 PM Rexene Alberts, MD [facility] [phone]      Discharge Medications:   Medication List    STOP taking these medications       meloxicam 7.5 MG tablet  Commonly known as:  MOBIC     nitroGLYCERIN 0.4 MG SL tablet  Commonly known as:  NITROSTAT     pravastatin 40 MG tablet  Commonly known as:  PRAVACHOL  Replaced by:  simvastatin 20 MG tablet      TAKE these medications       acetaminophen 500 MG tablet  Commonly known as:  TYLENOL  Take 1,000 mg by mouth every 6 (six) hours as needed. For pain  allopurinol 300 MG tablet  Commonly known as:  ZYLOPRIM  Take 300 mg by mouth daily.     amiodarone 200 MG tablet  Commonly known as:  PACERONE  Take 1 tablet (200 mg total) by mouth daily.     aspirin 325 MG EC tablet  Take 1 tablet (325 mg total) by mouth daily.     Fish Oil 1200 MG Caps  Take 1 capsule by mouth daily.     hydrochlorothiazide 50 MG tablet  Commonly known as:  HYDRODIURIL  Take 50 mg by mouth daily.     metoprolol tartrate 25 MG tablet  Commonly known as:  LOPRESSOR  Take 1 tablet (25 mg total) by mouth 2 (two) times daily.     multivitamin with minerals Tabs tablet  Take 1 tablet by mouth daily.     omeprazole 20 MG capsule  Commonly known as:  PRILOSEC  Take 20 mg by mouth daily.     OSTEO  BI-FLEX REGULAR STRENGTH PO  Take 1 tablet by mouth daily.     oxyCODONE 5 MG immediate release tablet  Commonly known as:  Oxy IR/ROXICODONE  Take 1 tablet (5 mg total) by mouth every 4 (four) hours as needed for moderate pain.     potassium chloride 10 MEQ tablet  Commonly known as:  K-DUR,KLOR-CON  Take 10 mEq by mouth daily.     simvastatin 20 MG tablet  Commonly known as:  ZOCOR  Take 1 tablet (20 mg total) by mouth daily at 6 PM.     triamcinolone cream 0.1 %  Commonly known as:  KENALOG  Apply 1 application topically 2 (two) times daily.         The patient has been discharged on:   1.Beta Blocker:  Yes [ x  ]                              No   [   ]                              If No, reason:  2.Ace Inhibitor/ARB: Yes [   ]                                     No  [  x  ]                                     If No, reason: Labile blood pressure  3.Statin:   Yes [ x  ]                  No  [   ]                  If No, reason:  4.Ecasa:  Yes  [ x  ]                  No   [   ]                  If No, reason:  Follow Up Appointments: Follow-up Information   Follow up with Rexene Alberts, MD On 11/12/2013. (PA/LAT CXR to be taken (at [facility]  which is in the same building as Dr. Leonarda Salon office) on 11/12/2013 at 2:00 pm;Appointment time is at 3:00 pm)    Specialty:  Cardiothoracic Surgery   Contact information:   [address] [place] [address] [phone]       Follow up with Rozann Lesches, MD On 11/09/2013. (Appointment time is at 11:30 am)    Specialty:  Cardiology   Contact information:   [place] [address] [phone]       Follow up with HAWKINS,EDWARD L, MD. (Call regarding further surveillance of HGA1C 5.9)    Specialty:  Pulmonary Disease   Contact information:   [place] [address] [phone]       Signed: ZIMMERMAN,DONIELLE MPA-C 10/23/2013,  7:57 AM

## 2013-10-23 ENCOUNTER — Encounter (HOSPITAL_COMMUNITY): Payer: Self-pay | Admitting: Thoracic Surgery (Cardiothoracic Vascular Surgery)

## 2013-10-23 MED ORDER — SIMVASTATIN 20 MG PO TABS: 20.0000 mg | ORAL_TABLET | Freq: Every day | ORAL | Status: DC

## 2013-10-23 MED ORDER — ASPIRIN 325 MG PO TBEC: 325.0000 mg | DELAYED_RELEASE_TABLET | Freq: Every day | ORAL | Status: DC

## 2013-10-23 MED ORDER — METOPROLOL TARTRATE 25 MG PO TABS: 25.0000 mg | ORAL_TABLET | Freq: Two times a day (BID) | ORAL | Status: DC

## 2013-10-23 MED ORDER — AMIODARONE HCL 200 MG PO TABS: 200.0000 mg | ORAL_TABLET | Freq: Every day | ORAL | Status: DC

## 2013-10-23 MED ORDER — OXYCODONE HCL 5 MG PO TABS: 5.0000 mg | ORAL_TABLET | ORAL | Status: DC | PRN

## 2013-10-23 MED ORDER — LISINOPRIL 2.5 MG PO TABS
2.5000 mg | ORAL_TABLET | Freq: Every day | ORAL | Status: DC
  Filled 2013-10-23: qty 1

## 2013-10-23 NOTE — Progress Notes (Signed)
1000-1040 Education completed with pt and wife. Understanding voiced. Gave diabetic and heart healthy diets since pt states prediabetic and has been informed to watch carbs. Discussed CRP 2 and permission given to refer to [facility] program. Put on discharge video when leaving for them to view. Graylon Good RN BSN 10/23/2013 10:40 AM

## 2013-10-23 NOTE — Progress Notes (Addendum)
      [address]       [place] [address]             [phone]        4 Days Post-Op Procedure(s) (LRB): CORONARY ARTERY BYPASS GRAFTING (CABG) (N/A) INTRAOPERATIVE TRANSESOPHAGEAL ECHOCARDIOGRAM (N/A)  Subjective: Patient hopes to go home today.  Objective: Vital signs in last 24 hours: Temp:  [98.5 F (36.9 C)-99 F (37.2 C)] 98.5 F (36.9 C) (03/10 0420) Pulse Rate:  [68-80] 68 (03/10 0420) Cardiac Rhythm:  [-] Normal sinus rhythm (03/09 1920) Resp:  [18] 18 (03/10 0420) BP: (107-144)/(64-92) 141/82 mmHg (03/10 0420) SpO2:  [97 %] 97 % (03/10 0420) Weight:  [94.6 kg (208 lb 8.9 oz)] 94.6 kg (208 lb 8.9 oz) (03/10 0420)  Pre op weight 94 kg Current Weight  10/23/13 94.6 kg (208 lb 8.9 oz)      Intake/Output from previous day: 03/09 0701 - 03/10 0700 In: 120 [P.O.:120] Out: 2625 [Urine:2625]   Physical Exam:  Cardiovascular: RRR, no murmurs, gallops, or rubs. Pulmonary: Clear to auscultation bilaterally; no rales, wheezes, or rhonchi. Abdomen: Soft, non tender, bowel sounds present. Extremities: Trace bilateral lower extremity edema. Wounds: Clean and dry.  No erythema or signs of infection.  Lab Results: CBC:  Recent Labs  10/20/13 1510 10/20/13 1512 10/21/13 0520  WBC 14.3*  --  12.9*  HGB 12.5* 12.9* 11.8*  HCT 37.7* 38.0* 35.7*  PLT 135*  --  120*   BMET:   Recent Labs  10/20/13 1512 10/21/13 0520  NA 138 133*  K 3.7 4.1  CL 101 97  CO2  --  28  GLUCOSE 119* 111*  BUN 16 19  CREATININE 0.90 0.77  CALCIUM  --  8.2*    PT/INR:  Lab Results  Component Value Date   INR 1.27 10/19/2013   INR 0.85 10/18/2013   INR 0.91 10/09/2013   ABG:  INR: Will add last result for INR, ABG once components are confirmed Will add last 4 CBG results once components are confirmed  Assessment/Plan:  1. CV - PAF over weekend.SR in the 70's this am. On Amiodarone 200 bid, HCTZ 50 daily, and Lopressor 25 bid.Will decrease  Amiodarone to 200 daily at discharge. 2.  Pulmonary - Encourage incentive spirometer 3. Volume Overload - On Lasix 40 daily 4.  Acute blood loss anemia - Last H and H 11.8 and 35.7 5. CBGs 114/134/104. Pre op HGA1C 5.9. He is  pre diabetic. Will need continued follow up with medical doctor. 6. Mild thrombocytopenia-last platelet count 120,000 7. Regarding statin at discharge, was on Pravastatin, but put on Zocor while in hospital. I gave a prescription for Zocor as is better for him s/p CABG. 8. Discharge  ZIMMERMAN,DONIELLE MPA-C 10/23/2013,7:32 AM    I have seen and examined the patient and agree with the assessment and plan as outlined.  [name],[name] 10/23/2013 8:23 AM

## 2013-11-09 ENCOUNTER — Ambulatory Visit (INDEPENDENT_AMBULATORY_CARE_PROVIDER_SITE_OTHER): Payer: Medicare Other | Admitting: Cardiology

## 2013-11-09 ENCOUNTER — Encounter: Payer: Self-pay | Admitting: Cardiology

## 2013-11-09 VITALS — BP 116/81 | HR 64

## 2013-11-09 DIAGNOSIS — I4891 Unspecified atrial fibrillation: Secondary | ICD-10-CM | POA: Diagnosis not present

## 2013-11-09 DIAGNOSIS — E782 Mixed hyperlipidemia: Secondary | ICD-10-CM | POA: Diagnosis not present

## 2013-11-09 DIAGNOSIS — I10 Essential (primary) hypertension: Secondary | ICD-10-CM

## 2013-11-09 DIAGNOSIS — I9789 Other postprocedural complications and disorders of the circulatory system, not elsewhere classified: Secondary | ICD-10-CM

## 2013-11-09 DIAGNOSIS — I2510 Atherosclerotic heart disease of native coronary artery without angina pectoris: Secondary | ICD-10-CM

## 2013-11-09 NOTE — Assessment & Plan Note (Signed)
Blood pressure is normal today. No changes made. 

## 2013-11-09 NOTE — Patient Instructions (Signed)
Your physician wants you to follow-up in: 1 month    Your physician recommends that you continue on your current medications as directed. Please refer to the Current Medication list given to you today.    Thank you for choosing [facility] !

## 2013-11-09 NOTE — Assessment & Plan Note (Signed)
Continue amiodarone, likely 4-6 week course and then discontinue. We can observe for any recurrences, otherwise continue aspirin for now.

## 2013-11-09 NOTE — Assessment & Plan Note (Signed)
Multivessel status post CABG as outlined above. Patient is recuperating well at this point, fairly early on. He will be seeing Dr. Roxy Manns on Monday with a chest x-ray. Hopefully will be able to get started with cardiac rehabilitation soon.

## 2013-11-09 NOTE — Assessment & Plan Note (Signed)
Now on Zocor, will plan to followup FLP and LFT in a few months.

## 2013-11-09 NOTE — Progress Notes (Signed)
Clinical Summary Brandon [name] is a 68 y.o.male last seen in the office in February of this year. At that time he was referred for a diagnostic cardiac catheterization which revealed multivessel CAD. He was seen in consultation by Dr. Roxy Manns and ultimately underwent CABG with LIMA to the LAD, SVG to the PDA, SVG to the intermediate, and SVG to the circumflex and obtuse marginal. He did have postoperative PAF which was treated with amiodarone.  He is here with his wife today, looks to be doing very well. Reports his appetite is returning, he has been walking regularly each day. Improving postoperative thoracic discomfort. He will be seeing Dr. Roxy Manns on Monday with chest x-ray.  Recent lab work showed potassium 4.1, BUN 19, creatinine 0.7, hemoglobin 11.8, platelets 120.  He is in sinus rhythm on examination today, no palpitations. We did discuss cardiac rehabilitation which he would like to do in [place].  No Known Allergies  Current Outpatient Prescriptions  Medication Sig Dispense Refill  . acetaminophen (TYLENOL) 500 MG tablet Take 1,000 mg by mouth every 6 (six) hours as needed. For pain      . allopurinol (ZYLOPRIM) 300 MG tablet Take 300 mg by mouth daily.      Marland Kitchen amiodarone (PACERONE) 200 MG tablet Take 1 tablet (200 mg total) by mouth daily.  30 tablet  1  . aspirin EC 325 MG EC tablet Take 1 tablet (325 mg total) by mouth daily.  30 tablet  0  . Glucosamine-Chondroitin (OSTEO BI-FLEX REGULAR STRENGTH PO) Take 1 tablet by mouth daily.      . hydrochlorothiazide (HYDRODIURIL) 50 MG tablet Take 50 mg by mouth daily.      . metoprolol (LOPRESSOR) 25 MG tablet Take 1 tablet (25 mg total) by mouth 2 (two) times daily.  60 tablet  1  . Multiple Vitamin (MULITIVITAMIN WITH MINERALS) TABS Take 1 tablet by mouth daily.      . Omega-3 Fatty Acids (FISH OIL) 1200 MG CAPS Take 1 capsule by mouth daily.      Marland Kitchen omeprazole (PRILOSEC) 20 MG capsule Take 20 mg by mouth daily.      Marland Kitchen oxyCODONE (OXY  IR/ROXICODONE) 5 MG immediate release tablet Take 1 tablet (5 mg total) by mouth every 4 (four) hours as needed for moderate pain.  30 tablet  0  . potassium chloride (K-DUR,KLOR-CON) 10 MEQ tablet Take 10 mEq by mouth daily.      . simvastatin (ZOCOR) 20 MG tablet Take 1 tablet (20 mg total) by mouth daily at 6 PM.  30 tablet  1  . triamcinolone cream (KENALOG) 0.1 % Apply 1 application topically 2 (two) times daily.       No current facility-administered medications for this visit.    Past Medical History  Diagnosis Date  . DDD (degenerative disc disease), lumbar     Dr. Carloyn Manner  . Mixed hyperlipidemia   . Lumbago   . History of gout   . GERD (gastroesophageal reflux disease)   . Essential hypertension, benign   . Coronary atherosclerosis of native coronary artery     Multivessel status post CABG March 2015  . History of [facility] spotted fever   . Hemorrhoids   . Cataract   . Hard of hearing   . Diabetes mellitus type 2, diet-controlled     Past Surgical History  Procedure Laterality Date  . Total knee arthroplasty Left   . Bicept tenodesis  11/26/2011    Procedure: BICEPT TENODESIS;  Surgeon: Augustin Schooling, MD;  Location: [facility];  Service: Orthopedics;  Laterality: Left;  . Left shoulder rotator cuff repair    . Lithotripsy    . Cataract surgery Left   . Vasectomy    . Colonoscopy    . Coronary artery bypass graft N/A 10/19/2013    Procedure: CORONARY ARTERY BYPASS GRAFTING (CABG);  Surgeon: Rexene Alberts, MD;  Location: [facility];  Service: Open Heart Surgery;  Laterality: N/A;  CABG times four using left internal mammary artery and left leg saphenous vein, incision made on right leg but no vein removed  . Intraoperative transesophageal echocardiogram N/A 10/19/2013    Procedure: INTRAOPERATIVE TRANSESOPHAGEAL ECHOCARDIOGRAM;  Surgeon: Rexene Alberts, MD;  Location: [facility];  Service: Open Heart Surgery;  Laterality: N/A;    Social History Mr. Jarnigan reports that he has quit  smoking. His smoking use included Cigarettes. He has a 60 pack-year smoking history. He has quit using smokeless tobacco. His smokeless tobacco use included Chew. Mr. Raatz reports that he drinks alcohol.  Review of Systems Has had some constipation. No orthopnea or PND. No cough. No fevers or chills. Otherwise negative.  Physical Examination Filed Vitals:   11/09/13 1150  BP: 116/81  Pulse: 64   Patient appears cough will rest. HEENT: Conjunctiva and lids normal, oropharynx clear. Neck: Supple, no elevated JVP or carotid bruits, no thyromegaly. Lungs: Clear to auscultation, nonlabored breathing at rest. Thorax: Well-healing sternal incision. Cardiac: Regular rate and rhythm, no S3 or significant systolic murmur, no pericardial rub. Abdomen: Soft, nontender, bowel sounds present. Extremities: No pitting edema, distal pulses 2+. Skin: Warm and dry. Musculoskeletal: No kyphosis. Neuropsychiatric: Alert and oriented x3, affect grossly appropriate.   Problem List and Plan   Coronary atherosclerosis of native coronary artery Multivessel status post CABG as outlined above. Patient is recuperating well at this point, fairly early on. He will be seeing Dr. Roxy Manns on Monday with a chest x-ray. Hopefully will be able to get started with cardiac rehabilitation soon.  Essential hypertension, benign Blood pressure is normal today. No changes made.  Mixed hyperlipidemia Now on Zocor, will plan to followup FLP and LFT in a few months.  Postoperative atrial fibrillation Continue amiodarone, likely 4-6 week course and then discontinue. We can observe for any recurrences, otherwise continue aspirin for now.    Satira Sark, M.D., F.A.C.C.

## 2013-11-12 ENCOUNTER — Encounter: Payer: Self-pay | Admitting: Thoracic Surgery (Cardiothoracic Vascular Surgery)

## 2013-11-12 ENCOUNTER — Ambulatory Visit
Admission: RE | Admit: 2013-11-12 | Discharge: 2013-11-12 | Disposition: A | Discharge status: Home / Self Care | Payer: Medicare Other | Source: Ambulatory Visit | Attending: Thoracic Surgery (Cardiothoracic Vascular Surgery) | Admitting: Thoracic Surgery (Cardiothoracic Vascular Surgery)

## 2013-11-12 ENCOUNTER — Other Ambulatory Visit: Payer: Self-pay | Admitting: *Deleted

## 2013-11-12 ENCOUNTER — Ambulatory Visit (INDEPENDENT_AMBULATORY_CARE_PROVIDER_SITE_OTHER): Payer: Self-pay | Admitting: Thoracic Surgery (Cardiothoracic Vascular Surgery)

## 2013-11-12 VITALS — BP 119/76 | HR 65 | Resp 20 | Ht 73.0 in | Wt 208.0 lb

## 2013-11-12 DIAGNOSIS — I2510 Atherosclerotic heart disease of native coronary artery without angina pectoris: Secondary | ICD-10-CM

## 2013-11-12 DIAGNOSIS — Z951 Presence of aortocoronary bypass graft: Secondary | ICD-10-CM

## 2013-11-12 DIAGNOSIS — J9819 Other pulmonary collapse: Secondary | ICD-10-CM | POA: Diagnosis not present

## 2013-11-12 NOTE — Patient Instructions (Signed)
Stop taking amiodarone when your current prescription runs out  The patient should continue to avoid any heavy lifting or strenuous use of arms or shoulders for at least a total of three months from the time of surgery.  The patient may return to driving an automobile as long as they are no longer requiring oral narcotic pain relievers during the daytime.  It would be wise to start driving only short distances during the daylight and gradually increase from there as they feel comfortable.  The patient is encouraged to enroll and participate in the outpatient cardiac rehab program beginning as soon as practical.

## 2013-11-12 NOTE — Progress Notes (Signed)
[address]       [place] [address]             [phone]     CARDIOTHORACIC SURGERY OFFICE NOTE  Referring Provider is Satira Sark, MD PCP is Alonza Bogus, MD   HPI:  Patient returns for routine followup status post coronary artery bypass grafting x4 on 10/19/2013. His early postoperative recovery in the hospital was notable for some transient postoperative atrial fibrillation. He was discharged from the hospital in sinus rhythm on oral amiodarone. Since hospital discharge she has done exceptionally well. He has had very little postoperative pain and he has not been taking any sort of oral narcotic pain relievers. His exercise tolerance has gradually improved and he denies any shortness of breath. He has not had any tachypalpitations or dizzy spells. His appetite is been gradually improving. Overall he has no complaints. He was seen in followup by Dr. Domenic Polite last week and plans to enroll in the outpatient cardiac rehabilitation program.   Current Outpatient Prescriptions  Medication Sig Dispense Refill  . acetaminophen (TYLENOL) 500 MG tablet Take 1,000 mg by mouth every 6 (six) hours as needed. For pain      . allopurinol (ZYLOPRIM) 300 MG tablet Take 300 mg by mouth daily.      Marland Kitchen amiodarone (PACERONE) 200 MG tablet Take 1 tablet (200 mg total) by mouth daily.  30 tablet  1  . aspirin EC 325 MG EC tablet Take 1 tablet (325 mg total) by mouth daily.  30 tablet  0  . Glucosamine-Chondroitin (OSTEO BI-FLEX REGULAR STRENGTH PO) Take 1 tablet by mouth daily.      . hydrochlorothiazide (HYDRODIURIL) 50 MG tablet Take 50 mg by mouth daily.      . metoprolol (LOPRESSOR) 25 MG tablet Take 1 tablet (25 mg total) by mouth 2 (two) times daily.  60 tablet  1  . Multiple Vitamin (MULITIVITAMIN WITH MINERALS) TABS Take 1 tablet by mouth daily.      . Omega-3 Fatty Acids (FISH OIL) 1200 MG CAPS Take 1 capsule by mouth daily.      Marland Kitchen omeprazole (PRILOSEC) 20 MG  capsule Take 20 mg by mouth daily.      . potassium chloride (K-DUR,KLOR-CON) 10 MEQ tablet Take 10 mEq by mouth daily.      . simvastatin (ZOCOR) 20 MG tablet Take 1 tablet (20 mg total) by mouth daily at 6 PM.  30 tablet  1  . triamcinolone cream (KENALOG) 0.1 % Apply 1 application topically 2 (two) times daily.       No current facility-administered medications for this visit.      Physical Exam:   BP 119/76  Pulse 65  Resp 20  Ht 6\' 1"  (1.854 m)  Wt 208 lb (94.348 kg)  BMI 27.45 kg/m2  SpO2 98%  General:  Well-appearing  Chest:   Clear to auscultation  CV:   Regular rate and rhythm without murmur  Incisions:  Clean and dry and healing nicely, sternum is stable  Abdomen:  Soft and nontender  Extremities:  Warm and well-perfused  Diagnostic Tests:  CHEST - 2 VIEW  COMPARISON: DG CHEST 2 VIEW dated 10/21/2013; DG CHEST 1V PORT dated  10/19/2013; DG CHEST 1V PORT dated 10/20/2013  FINDINGS:  The heart size is normal postoperatively. The aorta shows stable  mild ectasia. Pulmonary aeration is now normal with resolution of  atelectasis bilaterally. There is no evidence of pulmonary edema,  consolidation, pneumothorax, nodule or pleural fluid. The bony  thorax is unremarkable.  IMPRESSION:  Normal chest x-ray with resolution of bilateral atelectasis. No  pneumothorax or pleural effusion.  Electronically Signed  By: Aletta Edouard M.D.  On: 11/12/2013 13:54    Impression:  Patient is doing exceptionally well just 4 weeks status post coronary artery bypass grafting.  Plan:  I've encouraged patient to continue to increase his physical activity as tolerated with his primary limitation remaining that he refrain him any heavy lifting or strenuous use of his arms or shoulders for least another 2 months. I think he may resume driving an automobile. I've encouraged him to go ahead and get start an outpatient cardiac rehabilitation program. I've instructed him to stop taking  amiodarone when his current prescription runs out. All of his questions been addressed. We will have him return in one year for routine followup.   Valentina Gu. Roxy Manns, MD 11/12/2013 2:29 PM

## 2013-11-19 DIAGNOSIS — C4441 Basal cell carcinoma of skin of scalp and neck: Secondary | ICD-10-CM | POA: Diagnosis not present

## 2013-11-21 DIAGNOSIS — E785 Hyperlipidemia, unspecified: Secondary | ICD-10-CM | POA: Diagnosis not present

## 2013-11-21 DIAGNOSIS — I259 Chronic ischemic heart disease, unspecified: Secondary | ICD-10-CM | POA: Diagnosis not present

## 2013-11-21 DIAGNOSIS — I10 Essential (primary) hypertension: Secondary | ICD-10-CM | POA: Diagnosis not present

## 2013-11-21 DIAGNOSIS — M1990 Unspecified osteoarthritis, unspecified site: Secondary | ICD-10-CM | POA: Diagnosis not present

## 2013-12-03 DIAGNOSIS — L570 Actinic keratosis: Secondary | ICD-10-CM | POA: Diagnosis not present

## 2013-12-05 ENCOUNTER — Encounter: Payer: Self-pay | Admitting: Cardiology

## 2013-12-05 ENCOUNTER — Ambulatory Visit (INDEPENDENT_AMBULATORY_CARE_PROVIDER_SITE_OTHER): Payer: Medicare Other | Admitting: Cardiology

## 2013-12-05 VITALS — BP 118/83 | HR 65 | Ht 73.0 in | Wt 207.8 lb

## 2013-12-05 DIAGNOSIS — I10 Essential (primary) hypertension: Secondary | ICD-10-CM | POA: Diagnosis not present

## 2013-12-05 DIAGNOSIS — I2510 Atherosclerotic heart disease of native coronary artery without angina pectoris: Secondary | ICD-10-CM

## 2013-12-05 DIAGNOSIS — I4891 Unspecified atrial fibrillation: Secondary | ICD-10-CM | POA: Diagnosis not present

## 2013-12-05 DIAGNOSIS — I9789 Other postprocedural complications and disorders of the circulatory system, not elsewhere classified: Secondary | ICD-10-CM

## 2013-12-05 DIAGNOSIS — E782 Mixed hyperlipidemia: Secondary | ICD-10-CM

## 2013-12-05 NOTE — Assessment & Plan Note (Signed)
No obvious recurrence, now off amiodarone.

## 2013-12-05 NOTE — Patient Instructions (Signed)
Your physician recommends that you schedule a follow-up appointment in: 3 months with Dr Domenic Polite  Your physician recommends that you return for lab work this week. Lipid/Liver

## 2013-12-05 NOTE — Progress Notes (Signed)
Clinical Summary Mr. Pryor is a 68 y.o.male last seen in March status post CABG as detailed in the previous note. He saw Dr. Roxy Manns in the interim. He is here with his wife today. Doing very well overall. He did not pursue cardiac rehabilitation, has preferred exercise on is on at home. He does seem to be walking regularly, 2 or 3 miles at a time at this point. He does this slowly. Stamina has improved. Still observing lifting restrictions.  He was on Zocor since hospitalization but changed back to Pravachol by Dr. Luan Pulling, due for followup FLP and LFT.  He is very interested in returning to work. 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 Usually has to walk up and down hills, uses a chain saw. Can be physically demanding. I recommended that he not return to work at this point, continue to increase his stamina, and wait until we have further relax lifting restrictions. He might be able to get back to work in June if things continue to go well.  No Known Allergies  Current Outpatient Prescriptions  Medication Sig Dispense Refill  . acetaminophen (TYLENOL) 500 MG tablet Take 1,000 mg by mouth every 6 (six) hours as needed. For pain      . allopurinol (ZYLOPRIM) 300 MG tablet Take 300 mg by mouth daily.      Marland Kitchen aspirin EC 325 MG EC tablet Take 1 tablet (325 mg total) by mouth daily.  30 tablet  0  . Glucosamine-Chondroitin (OSTEO BI-FLEX REGULAR STRENGTH PO) Take 1 tablet by mouth daily.      . hydrochlorothiazide (HYDRODIURIL) 50 MG tablet Take 50 mg by mouth daily.      . metoprolol (LOPRESSOR) 25 MG tablet Take 1 tablet (25 mg total) by mouth 2 (two) times daily.  60 tablet  1  . Multiple Vitamin (MULITIVITAMIN WITH MINERALS) TABS Take 1 tablet by mouth daily.      . Omega-3 Fatty Acids (FISH OIL) 1200 MG CAPS Take 1 capsule by mouth daily.      Marland Kitchen omeprazole (PRILOSEC) 20 MG capsule Take 20 mg by mouth daily.      . potassium chloride (K-DUR,KLOR-CON) 10 MEQ tablet Take 10 mEq by mouth daily.      .  pravastatin (PRAVACHOL) 40 MG tablet Take 40 mg by mouth daily.      Marland Kitchen triamcinolone cream (KENALOG) 0.1 % Apply 1 application topically as needed.        No current facility-administered medications for this visit.    Past Medical History  Diagnosis Date  . DDD (degenerative disc disease), lumbar     Dr. Carloyn Manner  . Mixed hyperlipidemia   . Lumbago   . History of gout   . GERD (gastroesophageal reflux disease)   . Essential hypertension, benign   . Coronary atherosclerosis of native coronary artery     Multivessel status post CABG March 2015  . History of [facility] spotted fever   . Hemorrhoids   . Cataract   . Hard of hearing   . Diabetes mellitus type 2, diet-controlled     Past Surgical History  Procedure Laterality Date  . Total knee arthroplasty Left   . Bicept tenodesis  11/26/2011    Procedure: BICEPT TENODESIS;  Surgeon: Augustin Schooling, MD;  Location: [facility];  Service: Orthopedics;  Laterality: Left;  . Left shoulder rotator cuff repair    . Lithotripsy    . Cataract surgery Left   . Vasectomy    .  Colonoscopy    . Coronary artery bypass graft N/A 10/19/2013    Procedure: CORONARY ARTERY BYPASS GRAFTING (CABG);  Surgeon: Rexene Alberts, MD;  Location: [facility];  Service: Open Heart Surgery;  Laterality: N/A;  CABG times four using left internal mammary artery and left leg saphenous vein, incision made on right leg but no vein removed  . Intraoperative transesophageal echocardiogram N/A 10/19/2013    Procedure: INTRAOPERATIVE TRANSESOPHAGEAL ECHOCARDIOGRAM;  Surgeon: Rexene Alberts, MD;  Location: [facility];  Service: Open Heart Surgery;  Laterality: N/A;    Social History Mr. Mcelhiney reports that he has quit smoking. His smoking use included Cigarettes. He has a 60 pack-year smoking history. He has quit using smokeless tobacco. His smokeless tobacco use included Chew. Mr. Sax reports that he drinks alcohol.  Review of Systems Negative except as outlined.  Physical  Examination Filed Vitals:   12/05/13 1144  BP: 118/83  Pulse: 65   Filed Weights   12/05/13 1144  Weight: 207 lb 12.8 oz (94.257 kg)    Patient appears cough will rest.  HEENT: Conjunctiva and lids normal, oropharynx clear.  Neck: Supple, no elevated JVP or carotid bruits, no thyromegaly.  Lungs: Clear to auscultation, nonlabored breathing at rest.  Thorax: Well-healing sternal incision.  Cardiac: Regular rate and rhythm, no S3 or significant systolic murmur, no pericardial rub.  Abdomen: Soft, nontender, bowel sounds present.  Extremities: No pitting edema, distal pulses 2+.  Skin: Warm and dry.  Musculoskeletal: No kyphosis.  Neuropsychiatric: Alert and oriented x3, affect grossly appropriate.   Problem List and Plan   Coronary atherosclerosis of native coronary artery Multivessel disease status post CABG in March. He is doing very well. Recommended he continue to increase his stamina through walking. He is doing well on medical therapy. No changes were made today. He may be able to get back to work with gradual return to regular activity beginning in June.  Mixed hyperlipidemia Now on Pravachol. Have ordered FLP and LFT.  Essential hypertension, benign Blood pressure is normal today.  Postoperative atrial fibrillation No obvious recurrence, now off amiodarone.      Satira Sark, M.D., F.A.C.C.

## 2013-12-05 NOTE — Assessment & Plan Note (Signed)
Now on Pravachol. Have ordered FLP and LFT.

## 2013-12-05 NOTE — Assessment & Plan Note (Signed)
Blood pressure is normal today. 

## 2013-12-05 NOTE — Assessment & Plan Note (Signed)
Multivessel disease status post CABG in March. He is doing very well. Recommended he continue to increase his stamina through walking. He is doing well on medical therapy. No changes were made today. He may be able to get back to work with gradual return to regular activity beginning in June.

## 2013-12-06 DIAGNOSIS — I2510 Atherosclerotic heart disease of native coronary artery without angina pectoris: Secondary | ICD-10-CM | POA: Diagnosis not present

## 2013-12-06 LAB — HEPATIC FUNCTION PANEL
ALT: 22 U/L (ref 0–53)
AST: 23 U/L (ref 0–37)
Albumin: 4.4 g/dL (ref 3.5–5.2)
Alkaline Phosphatase: 74 U/L (ref 39–117)
Bilirubin, Direct: 0.2 mg/dL (ref 0.0–0.3)
Indirect Bilirubin: 0.5 mg/dL (ref 0.2–1.2)
Total Bilirubin: 0.7 mg/dL (ref 0.2–1.2)
Total Protein: 7 g/dL (ref 6.0–8.3)

## 2013-12-06 LAB — LIPID PANEL
Cholesterol: 141 mg/dL (ref 0–200)
HDL: 58 mg/dL (ref >39)
LDL Cholesterol: 65 mg/dL (ref 0–99)
Total CHOL/HDL Ratio: 2.4 Ratio
Triglycerides: 92 mg/dL (ref <150)
VLDL: 18 mg/dL (ref 0–40)

## 2013-12-13 ENCOUNTER — Encounter (HOSPITAL_COMMUNITY): Payer: Medicare Other

## 2013-12-18 DIAGNOSIS — L259 Unspecified contact dermatitis, unspecified cause: Secondary | ICD-10-CM | POA: Diagnosis not present

## 2013-12-19 DIAGNOSIS — L259 Unspecified contact dermatitis, unspecified cause: Secondary | ICD-10-CM | POA: Diagnosis not present

## 2013-12-25 ENCOUNTER — Other Ambulatory Visit: Payer: Self-pay | Admitting: Physician Assistant

## 2013-12-27 ENCOUNTER — Other Ambulatory Visit: Payer: Self-pay | Admitting: Physician Assistant

## 2014-01-17 DIAGNOSIS — M47817 Spondylosis without myelopathy or radiculopathy, lumbosacral region: Secondary | ICD-10-CM | POA: Diagnosis not present

## 2014-01-21 DIAGNOSIS — Z85828 Personal history of other malignant neoplasm of skin: Secondary | ICD-10-CM | POA: Diagnosis not present

## 2014-01-21 DIAGNOSIS — L570 Actinic keratosis: Secondary | ICD-10-CM | POA: Diagnosis not present

## 2014-01-22 DIAGNOSIS — M545 Low back pain, unspecified: Secondary | ICD-10-CM | POA: Diagnosis not present

## 2014-01-22 DIAGNOSIS — I119 Hypertensive heart disease without heart failure: Secondary | ICD-10-CM | POA: Diagnosis not present

## 2014-01-22 DIAGNOSIS — M1990 Unspecified osteoarthritis, unspecified site: Secondary | ICD-10-CM | POA: Diagnosis not present

## 2014-01-22 DIAGNOSIS — E785 Hyperlipidemia, unspecified: Secondary | ICD-10-CM | POA: Diagnosis not present

## 2014-02-18 DIAGNOSIS — Z961 Presence of intraocular lens: Secondary | ICD-10-CM | POA: Diagnosis not present

## 2014-02-18 DIAGNOSIS — H01029 Squamous blepharitis unspecified eye, unspecified eyelid: Secondary | ICD-10-CM | POA: Diagnosis not present

## 2014-02-18 DIAGNOSIS — H2510 Age-related nuclear cataract, unspecified eye: Secondary | ICD-10-CM | POA: Diagnosis not present

## 2014-02-18 DIAGNOSIS — H1045 Other chronic allergic conjunctivitis: Secondary | ICD-10-CM | POA: Diagnosis not present

## 2014-02-27 ENCOUNTER — Ambulatory Visit (INDEPENDENT_AMBULATORY_CARE_PROVIDER_SITE_OTHER): Payer: Medicare Other | Admitting: Cardiology

## 2014-02-27 ENCOUNTER — Encounter: Payer: Self-pay | Admitting: Cardiology

## 2014-02-27 VITALS — BP 128/92 | HR 60 | Ht 73.0 in | Wt 207.0 lb

## 2014-02-27 DIAGNOSIS — I2510 Atherosclerotic heart disease of native coronary artery without angina pectoris: Secondary | ICD-10-CM

## 2014-02-27 DIAGNOSIS — I10 Essential (primary) hypertension: Secondary | ICD-10-CM | POA: Diagnosis not present

## 2014-02-27 DIAGNOSIS — I4891 Unspecified atrial fibrillation: Secondary | ICD-10-CM | POA: Diagnosis not present

## 2014-02-27 DIAGNOSIS — I9789 Other postprocedural complications and disorders of the circulatory system, not elsewhere classified: Secondary | ICD-10-CM

## 2014-02-27 NOTE — Progress Notes (Signed)
Clinical Summary [name] is a 68 y.o.male last seen in April. He is here with his wife. He reports no angina symptoms, has not been walking as regularly due to plantar fasciitis. He has returned to work with his [employer]. I reminded him about lifting restrictions.  Lab work in April showed normal LFTs, cholesterol 141, triglycerides 92, HDL 58, LDL 65.  He reports no changes in his medications. Appetite has been good, he states he has been eating "too much."   No Known Allergies  Current Outpatient Prescriptions  Medication Sig Dispense Refill  . acetaminophen (TYLENOL) 500 MG tablet Take 1,000 mg by mouth every 6 (six) hours as needed. For pain      . allopurinol (ZYLOPRIM) 300 MG tablet Take 300 mg by mouth daily.      Marland Kitchen aspirin EC 325 MG EC tablet Take 1 tablet (325 mg total) by mouth daily.  30 tablet  0  . Glucosamine-Chondroitin (OSTEO BI-FLEX REGULAR STRENGTH PO) Take 1 tablet by mouth daily.      . hydrochlorothiazide (HYDRODIURIL) 50 MG tablet Take 50 mg by mouth daily.      . metoprolol tartrate (LOPRESSOR) 25 MG tablet TAKE ONE TABLET BY MOUTH TWICE DAILY  60 tablet  0  . Multiple Vitamin (MULITIVITAMIN WITH MINERALS) TABS Take 1 tablet by mouth daily.      . Omega-3 Fatty Acids (FISH OIL) 1200 MG CAPS Take 1 capsule by mouth daily.      Marland Kitchen omeprazole (PRILOSEC) 20 MG capsule Take 20 mg by mouth daily.      . potassium chloride (K-DUR,KLOR-CON) 10 MEQ tablet Take 10 mEq by mouth daily.      . pravastatin (PRAVACHOL) 40 MG tablet Take 40 mg by mouth daily.      Marland Kitchen triamcinolone cream (KENALOG) 0.1 % Apply 1 application topically as needed.        No current facility-administered medications for this visit.    Past Medical History  Diagnosis Date  . DDD (degenerative disc disease), lumbar     Dr. Carloyn Manner  . Mixed hyperlipidemia   . Lumbago   . History of gout   . GERD (gastroesophageal reflux disease)   . Essential hypertension, benign   . Coronary  atherosclerosis of native coronary artery     Multivessel status post CABG March 2015  . History of [facility] spotted fever   . Hemorrhoids   . Cataract   . Hard of hearing   . Diabetes mellitus type 2, diet-controlled     Past Surgical History  Procedure Laterality Date  . Total knee arthroplasty Left   . Bicept tenodesis  11/26/2011    Procedure: BICEPT TENODESIS;  Surgeon: Augustin Schooling, MD;  Location: [facility];  Service: Orthopedics;  Laterality: Left;  . Left shoulder rotator cuff repair    . Lithotripsy    . Cataract surgery Left   . Vasectomy    . Colonoscopy    . Coronary artery bypass graft N/A 10/19/2013    Procedure: CORONARY ARTERY BYPASS GRAFTING (CABG);  Surgeon: Rexene Alberts, MD;  Location: [facility];  Service: Open Heart Surgery;  Laterality: N/A;  CABG times four using left internal mammary artery and left leg saphenous vein, incision made on right leg but no vein removed  . Intraoperative transesophageal echocardiogram N/A 10/19/2013    Procedure: INTRAOPERATIVE TRANSESOPHAGEAL ECHOCARDIOGRAM;  Surgeon: Rexene Alberts, MD;  Location: [facility];  Service: Open Heart Surgery;  Laterality: N/A;  Social History [name] reports that he has quit smoking. His smoking use included Cigarettes. He has a 60 pack-year smoking history. He has quit using smokeless tobacco. His smokeless tobacco use included Chew. [name] reports that he drinks alcohol.  Review of Systems No palpitations, dizziness, syncope. Other systems reviewed and negative.  Physical Examination Filed Vitals:   02/27/14 1022  BP: 128/92  Pulse: 60   Filed Weights   02/27/14 1022  Weight: 207 lb (93.895 kg)    Patient appears cough will rest.  HEENT: Conjunctiva and lids normal, oropharynx clear.  Neck: Supple, no elevated JVP or carotid bruits, no thyromegaly.  Lungs: Clear to auscultation, nonlabored breathing at rest.  Thorax: Well-healing sternal incision.  Cardiac: Regular rate and  rhythm, no S3 or significant systolic murmur, no pericardial rub.  Abdomen: Soft, nontender, bowel sounds present.  Extremities: No pitting edema, distal pulses 2+.  Skin: Warm and dry.  Musculoskeletal: No kyphosis.  Neuropsychiatric: Alert and oriented x3, affect grossly appropriate.   Problem List and Plan   Coronary atherosclerosis of native coronary artery Symptomatically stable with multivessel disease status post CABG as outlined above. Continue medical therapy and observation. Followup arranged for 6 months.  Postoperative atrial fibrillation No recurrence, continue observation.  Essential hypertension, benign No change to current regimen. Recommended that he get back to his walking regimen.    Satira Sark, M.D., F.A.C.C.

## 2014-02-27 NOTE — Assessment & Plan Note (Signed)
No change to current regimen. Recommended that he get back to his walking regimen.

## 2014-02-27 NOTE — Assessment & Plan Note (Signed)
Symptomatically stable with multivessel disease status post CABG as outlined above. Continue medical therapy and observation. Followup arranged for 6 months.

## 2014-02-27 NOTE — Assessment & Plan Note (Signed)
No recurrence, continue observation.

## 2014-02-27 NOTE — Patient Instructions (Signed)
Your physician recommends that you schedule a follow-up appointment in: 6 months with Dr McDowell You will receive a reminder letter two months in advance reminding you to call and schedule your appointment. If you don't receive this letter, please contact our office.  Your physician recommends that you continue on your current medications as directed. Please refer to the Current Medication list given to you today.   

## 2014-03-06 ENCOUNTER — Other Ambulatory Visit: Payer: Self-pay | Admitting: Physician Assistant

## 2014-03-07 DIAGNOSIS — L570 Actinic keratosis: Secondary | ICD-10-CM | POA: Diagnosis not present

## 2014-03-26 DIAGNOSIS — M1710 Unilateral primary osteoarthritis, unspecified knee: Secondary | ICD-10-CM | POA: Diagnosis not present

## 2014-03-27 ENCOUNTER — Encounter (HOSPITAL_COMMUNITY): Payer: Self-pay | Admitting: Emergency Medicine

## 2014-03-27 ENCOUNTER — Ambulatory Visit (HOSPITAL_COMMUNITY)
Admission: EM | Admit: 2014-03-27 | Discharge: 2014-03-27 | Disposition: A | Discharge status: Home / Self Care | Payer: Medicare Other | Attending: Emergency Medicine | Admitting: Emergency Medicine

## 2014-03-27 ENCOUNTER — Emergency Department (HOSPITAL_COMMUNITY): Payer: Medicare Other

## 2014-03-27 ENCOUNTER — Encounter (HOSPITAL_COMMUNITY): Payer: Medicare Other | Admitting: Anesthesiology

## 2014-03-27 ENCOUNTER — Emergency Department (HOSPITAL_COMMUNITY): Payer: Medicare Other | Admitting: Anesthesiology

## 2014-03-27 ENCOUNTER — Encounter (HOSPITAL_COMMUNITY)
Admission: EM | Disposition: A | Discharge status: Home / Self Care | Payer: Self-pay | Source: Home / Self Care | Attending: Emergency Medicine

## 2014-03-27 DIAGNOSIS — I2510 Atherosclerotic heart disease of native coronary artery without angina pectoris: Secondary | ICD-10-CM | POA: Insufficient documentation

## 2014-03-27 DIAGNOSIS — IMO0002 Reserved for concepts with insufficient information to code with codable children: Secondary | ICD-10-CM | POA: Insufficient documentation

## 2014-03-27 DIAGNOSIS — E119 Type 2 diabetes mellitus without complications: Secondary | ICD-10-CM | POA: Insufficient documentation

## 2014-03-27 DIAGNOSIS — Z79899 Other long term (current) drug therapy: Secondary | ICD-10-CM | POA: Insufficient documentation

## 2014-03-27 DIAGNOSIS — I10 Essential (primary) hypertension: Secondary | ICD-10-CM | POA: Diagnosis not present

## 2014-03-27 DIAGNOSIS — Z87891 Personal history of nicotine dependence: Secondary | ICD-10-CM | POA: Insufficient documentation

## 2014-03-27 DIAGNOSIS — Z951 Presence of aortocoronary bypass graft: Secondary | ICD-10-CM | POA: Diagnosis not present

## 2014-03-27 DIAGNOSIS — Z7982 Long term (current) use of aspirin: Secondary | ICD-10-CM | POA: Diagnosis not present

## 2014-03-27 DIAGNOSIS — E782 Mixed hyperlipidemia: Secondary | ICD-10-CM | POA: Diagnosis not present

## 2014-03-27 DIAGNOSIS — W312XXA Contact with powered woodworking and forming machines, initial encounter: Secondary | ICD-10-CM | POA: Diagnosis not present

## 2014-03-27 DIAGNOSIS — Z791 Long term (current) use of non-steroidal anti-inflammatories (NSAID): Secondary | ICD-10-CM | POA: Insufficient documentation

## 2014-03-27 DIAGNOSIS — S6990XA Unspecified injury of unspecified wrist, hand and finger(s), initial encounter: Secondary | ICD-10-CM | POA: Diagnosis not present

## 2014-03-27 DIAGNOSIS — W298XXA Contact with other powered powered hand tools and household machinery, initial encounter: Secondary | ICD-10-CM | POA: Insufficient documentation

## 2014-03-27 DIAGNOSIS — K2100 Gastro-esophageal reflux disease with esophagitis, without bleeding: Secondary | ICD-10-CM | POA: Diagnosis not present

## 2014-03-27 DIAGNOSIS — S62639B Displaced fracture of distal phalanx of unspecified finger, initial encounter for open fracture: Secondary | ICD-10-CM | POA: Diagnosis not present

## 2014-03-27 DIAGNOSIS — S6980XA Other specified injuries of unspecified wrist, hand and finger(s), initial encounter: Secondary | ICD-10-CM | POA: Diagnosis not present

## 2014-03-27 DIAGNOSIS — K219 Gastro-esophageal reflux disease without esophagitis: Secondary | ICD-10-CM | POA: Insufficient documentation

## 2014-03-27 DIAGNOSIS — S61209A Unspecified open wound of unspecified finger without damage to nail, initial encounter: Secondary | ICD-10-CM | POA: Diagnosis not present

## 2014-03-27 DIAGNOSIS — Z01818 Encounter for other preprocedural examination: Secondary | ICD-10-CM | POA: Diagnosis not present

## 2014-03-27 HISTORY — PX: AMPUTATION: SHX166

## 2014-03-27 LAB — CBC WITH DIFFERENTIAL/PLATELET
Basophils Absolute: 0 10*3/uL (ref 0.0–0.1)
Basophils Relative: 0 % (ref 0–1)
Eosinophils Absolute: 0 10*3/uL (ref 0.0–0.7)
Eosinophils Relative: 0 % (ref 0–5)
HCT: 43 % (ref 39.0–52.0)
Hemoglobin: 14.2 g/dL (ref 13.0–17.0)
Lymphocytes Relative: 11 % — ABNORMAL LOW (ref 12–46)
Lymphs Abs: 1.3 10*3/uL (ref 0.7–4.0)
MCH: 29.7 pg (ref 26.0–34.0)
MCHC: 33 g/dL (ref 30.0–36.0)
MCV: 90 fL (ref 78.0–100.0)
Monocytes Absolute: 0.9 10*3/uL (ref 0.1–1.0)
Monocytes Relative: 8 % (ref 3–12)
Neutro Abs: 9.7 10*3/uL — ABNORMAL HIGH (ref 1.7–7.7)
Neutrophils Relative %: 81 % — ABNORMAL HIGH (ref 43–77)
Platelets: 239 10*3/uL (ref 150–400)
RBC: 4.78 MIL/uL (ref 4.22–5.81)
RDW: 14.1 % (ref 11.5–15.5)
WBC: 11.9 10*3/uL — ABNORMAL HIGH (ref 4.0–10.5)

## 2014-03-27 LAB — GLUCOSE, CAPILLARY: Glucose-Capillary: 109 mg/dL — ABNORMAL HIGH (ref 70–99)

## 2014-03-27 LAB — BASIC METABOLIC PANEL
Anion gap: 15 (ref 5–15)
BUN: 33 mg/dL — ABNORMAL HIGH (ref 6–23)
CO2: 25 mEq/L (ref 19–32)
Calcium: 9.3 mg/dL (ref 8.4–10.5)
Chloride: 101 mEq/L (ref 96–112)
Creatinine, Ser: 0.79 mg/dL (ref 0.50–1.35)
GFR calc Af Amer: >90 mL/min (ref >90)
GFR calc non Af Amer: >90 mL/min (ref >90)
Glucose, Bld: 125 mg/dL — ABNORMAL HIGH (ref 70–99)
Potassium: 4 mEq/L (ref 3.7–5.3)
Sodium: 141 mEq/L (ref 137–147)

## 2014-03-27 SURGERY — AMPUTATION DIGIT
Anesthesia: Monitor Anesthesia Care | Laterality: Right

## 2014-03-27 MED ORDER — CHLORHEXIDINE GLUCONATE 4 % EX LIQD
60.0000 mL | Freq: Once | CUTANEOUS | Status: DC
  Filled 2014-03-27: qty 60

## 2014-03-27 MED ORDER — MIDAZOLAM HCL 5 MG/5ML IJ SOLN
INTRAMUSCULAR | Status: DC | PRN
  Administered 2014-03-27 (×2): 1 mg via INTRAVENOUS

## 2014-03-27 MED ORDER — CEFAZOLIN SODIUM-DEXTROSE 2-3 GM-% IV SOLR
2.0000 g | INTRAVENOUS | Status: AC
  Administered 2014-03-27: 2 g via INTRAVENOUS

## 2014-03-27 MED ORDER — FENTANYL CITRATE 0.05 MG/ML IJ SOLN
INTRAMUSCULAR | Status: AC
  Filled 2014-03-27: qty 5

## 2014-03-27 MED ORDER — LIDOCAINE HCL (PF) 1 % IJ SOLN
INTRAMUSCULAR | Status: AC
  Filled 2014-03-27: qty 30

## 2014-03-27 MED ORDER — ONDANSETRON HCL 4 MG/2ML IJ SOLN: 4.0000 mg | Freq: Once | INTRAMUSCULAR | Status: DC | PRN

## 2014-03-27 MED ORDER — HYDROMORPHONE HCL PF 1 MG/ML IJ SOLN: 0.2500 mg | INTRAMUSCULAR | Status: DC | PRN

## 2014-03-27 MED ORDER — DOCUSATE SODIUM 100 MG PO CAPS: 100.0000 mg | ORAL_CAPSULE | Freq: Two times a day (BID) | ORAL | Status: DC

## 2014-03-27 MED ORDER — BUPIVACAINE HCL (PF) 0.25 % IJ SOLN
INTRAMUSCULAR | Status: DC | PRN
  Administered 2014-03-27: 7 mL

## 2014-03-27 MED ORDER — OXYCODONE-ACETAMINOPHEN 5-325 MG PO TABS
1.0000 | ORAL_TABLET | Freq: Once | ORAL | Status: AC
  Administered 2014-03-27: 1 via ORAL
  Filled 2014-03-27: qty 1

## 2014-03-27 MED ORDER — PROPOFOL 10 MG/ML IV BOLUS
INTRAVENOUS | Status: DC | PRN
  Administered 2014-03-27: 10 mg via INTRAVENOUS
  Administered 2014-03-27: 20 mg via INTRAVENOUS

## 2014-03-27 MED ORDER — PROPOFOL 10 MG/ML IV BOLUS
INTRAVENOUS | Status: AC
  Filled 2014-03-27: qty 20

## 2014-03-27 MED ORDER — OXYCODONE-ACETAMINOPHEN 5-325 MG PO TABS: 1.0000 | ORAL_TABLET | ORAL | Status: DC | PRN

## 2014-03-27 MED ORDER — LACTATED RINGERS IV SOLN
INTRAVENOUS | Status: DC
  Administered 2014-03-27: 17:00:00 via INTRAVENOUS

## 2014-03-27 MED ORDER — CEFAZOLIN SODIUM 1-5 GM-% IV SOLN
1.0000 g | Freq: Once | INTRAVENOUS | Status: AC
  Administered 2014-03-27: 1 g via INTRAVENOUS
  Filled 2014-03-27: qty 50

## 2014-03-27 MED ORDER — MIDAZOLAM HCL 2 MG/2ML IJ SOLN
INTRAMUSCULAR | Status: AC
  Filled 2014-03-27: qty 2

## 2014-03-27 MED ORDER — LACTATED RINGERS IV SOLN
INTRAVENOUS | Status: DC | PRN
  Administered 2014-03-27: 18:00:00 via INTRAVENOUS

## 2014-03-27 MED ORDER — LIDOCAINE HCL (PF) 1 % IJ SOLN
INTRAMUSCULAR | Status: DC | PRN
  Administered 2014-03-27: 7 mL

## 2014-03-27 MED ORDER — FENTANYL CITRATE 0.05 MG/ML IJ SOLN
INTRAMUSCULAR | Status: DC | PRN
  Administered 2014-03-27 (×3): 50 ug via INTRAVENOUS

## 2014-03-27 MED ORDER — CEPHALEXIN 500 MG PO CAPS: 500.0000 mg | ORAL_CAPSULE | Freq: Four times a day (QID) | ORAL | Status: DC

## 2014-03-27 MED ORDER — BUPIVACAINE HCL (PF) 0.25 % IJ SOLN
INTRAMUSCULAR | Status: AC
  Filled 2014-03-27: qty 30

## 2014-03-27 SURGICAL SUPPLY — 44 items
BANDAGE ELASTIC 3 VELCRO ST LF (GAUZE/BANDAGES/DRESSINGS) IMPLANT
BANDAGE ELASTIC 4 VELCRO ST LF (GAUZE/BANDAGES/DRESSINGS) IMPLANT
BNDG CMPR MD 5X2 ELC HKLP STRL (GAUZE/BANDAGES/DRESSINGS) ×1
BNDG COHESIVE 1X5 TAN STRL LF (GAUZE/BANDAGES/DRESSINGS) ×2 IMPLANT
BNDG CONFORM 2 STRL LF (GAUZE/BANDAGES/DRESSINGS) ×1 IMPLANT
BNDG ELASTIC 2 VLCR STRL LF (GAUZE/BANDAGES/DRESSINGS) ×2 IMPLANT
BNDG GAUZE ELAST 4 BULKY (GAUZE/BANDAGES/DRESSINGS) ×2 IMPLANT
CORDS BIPOLAR (ELECTRODE) ×2 IMPLANT
COVER SURGICAL LIGHT HANDLE (MISCELLANEOUS) ×2 IMPLANT
CUFF TOURNIQUET SINGLE 18IN (TOURNIQUET CUFF) ×2 IMPLANT
CUFF TOURNIQUET SINGLE 24IN (TOURNIQUET CUFF) IMPLANT
DRAPE SURG 17X23 STRL (DRAPES) ×2 IMPLANT
DRSG ADAPTIC 3X8 NADH LF (GAUZE/BANDAGES/DRESSINGS) ×2 IMPLANT
GAUZE SPONGE 2X2 8PLY STRL LF (GAUZE/BANDAGES/DRESSINGS) IMPLANT
GAUZE SPONGE 4X4 12PLY STRL (GAUZE/BANDAGES/DRESSINGS) IMPLANT
GLOVE BIOGEL PI IND STRL 8.5 (GLOVE) ×1 IMPLANT
GLOVE BIOGEL PI INDICATOR 8.5 (GLOVE) ×1
GLOVE SURG ORTHO 8.0 STRL STRW (GLOVE) ×2 IMPLANT
GOWN STRL REUS W/ TWL LRG LVL3 (GOWN DISPOSABLE) ×2 IMPLANT
GOWN STRL REUS W/ TWL XL LVL3 (GOWN DISPOSABLE) ×1 IMPLANT
GOWN STRL REUS W/TWL LRG LVL3 (GOWN DISPOSABLE) ×4
GOWN STRL REUS W/TWL XL LVL3 (GOWN DISPOSABLE) ×2
KIT BASIN OR (CUSTOM PROCEDURE TRAY) ×2 IMPLANT
KIT ROOM TURNOVER OR (KITS) ×2 IMPLANT
MANIFOLD NEPTUNE II (INSTRUMENTS) ×2 IMPLANT
NDL HYPO 25GX1X1/2 BEV (NEEDLE) IMPLANT
NEEDLE HYPO 25GX1X1/2 BEV (NEEDLE) IMPLANT
NS IRRIG 1000ML POUR BTL (IV SOLUTION) ×2 IMPLANT
PACK ORTHO EXTREMITY (CUSTOM PROCEDURE TRAY) ×2 IMPLANT
PAD ARMBOARD 7.5X6 YLW CONV (MISCELLANEOUS) ×4 IMPLANT
PAD CAST 4YDX4 CTTN HI CHSV (CAST SUPPLIES) IMPLANT
PADDING CAST COTTON 4X4 STRL (CAST SUPPLIES)
SOAP 2 % CHG 4 OZ (WOUND CARE) ×2 IMPLANT
SPECIMEN JAR SMALL (MISCELLANEOUS) ×2 IMPLANT
SPONGE GAUZE 2X2 STER 10/PKG (GAUZE/BANDAGES/DRESSINGS) ×1
SUCTION FRAZIER TIP 10 FR DISP (SUCTIONS) IMPLANT
SUT CHROMIC 5 0 P 3 (SUTURE) ×2 IMPLANT
SUT MERSILENE 4 0 P 3 (SUTURE) IMPLANT
SUT PROLENE 4 0 PS 2 18 (SUTURE) IMPLANT
SYR CONTROL 10ML LL (SYRINGE) IMPLANT
TOWEL OR 17X24 6PK STRL BLUE (TOWEL DISPOSABLE) ×2 IMPLANT
TOWEL OR 17X26 10 PK STRL BLUE (TOWEL DISPOSABLE) ×2 IMPLANT
TUBE CONNECTING 12X1/4 (SUCTIONS) IMPLANT
WATER STERILE IRR 1000ML POUR (IV SOLUTION) ×2 IMPLANT

## 2014-03-27 NOTE — ED Notes (Signed)
Pt amputated the tip of right index finger while using a saw, bleeding controlled.

## 2014-03-27 NOTE — Anesthesia Postprocedure Evaluation (Signed)
Anesthesia Post Note  [name]  Procedure(s) Performed: Procedure(s) (LRB): DEBRIDEMENT AND CLOSURE RIGHT INDEX FINGER (Right)  Anesthesia type: MAC  Patient location: PACU  Post pain: Pain level controlled and Adequate analgesia  Post assessment: Post-op Vital signs reviewed, Patient's Cardiovascular Status Stable and Respiratory Function Stable  Last Vitals:  Filed Vitals:   03/27/14 1945  BP: 156/89  Pulse: 48  Temp: 36.5 C  Resp: 12    Post vital signs: Reviewed and stable  Level of consciousness: awake, alert  and oriented  Complications: No apparent anesthesia complications

## 2014-03-27 NOTE — Brief Op Note (Signed)
03/27/2014  5:35 PM  PATIENT:  Brandon [name]  68 y.o. male  PRE-OPERATIVE DIAGNOSIS:  Traumatic Amputation of Right Index Finger  POST-OPERATIVE DIAGNOSIS:  Traumatic Amputation of Right Index Finger  PROCEDURE:  Procedure(s): DEBRIDEMENT AND CLOSURE RIGHT INDEX FINGER (Right)  SURGEON:  Surgeon(s) and Role:    * [name], MD - Primary  PHYSICIAN ASSISTANT:   ASSISTANTS: none   ANESTHESIA:   general  EBL:     BLOOD ADMINISTERED:none  DRAINS: none   LOCAL MEDICATIONS USED:  MARCAINE     SPECIMEN:  No Specimen  DISPOSITION OF SPECIMEN:  N/A  COUNTS:  YES  TOURNIQUET:    DICTATION: [id number]  PLAN OF CARE: Discharge to home after PACU  PATIENT DISPOSITION:  PACU - hemodynamically stable.   Delay start of Pharmacological VTE agent (>24hrs) due to surgical blood loss or risk of bleeding: not applicable

## 2014-03-27 NOTE — Anesthesia Preprocedure Evaluation (Addendum)
Anesthesia Evaluation  Patient identified by MRN, date of birth, ID band Patient awake    Reviewed: Allergy & Precautions, H&P , NPO status , Patient's Chart, lab work & pertinent test results, reviewed documented beta blocker date and time   Airway Mallampati: II TM Distance: >3 FB Neck ROM: Full    Dental  (+) Teeth Intact, Dental Advisory Given   Pulmonary former smoker,  breath sounds clear to auscultation        Cardiovascular hypertension, Pt. on medications + CAD and + CABG Rhythm:Regular Rate:Normal     Neuro/Psych    GI/Hepatic GERD-  ,  Endo/Other  diabetes  Renal/GU      Musculoskeletal   Abdominal   Peds  Hematology   Anesthesia Other Findings   Reproductive/Obstetrics                         Anesthesia Physical Anesthesia Plan  ASA: III  Anesthesia Plan: MAC   Post-op Pain Management:    Induction: Intravenous  Airway Management Planned: Natural Airway and Simple Face Mask  Additional Equipment:   Intra-op Plan:   Post-operative Plan:   Informed Consent: I have reviewed the patients History and Physical, chart, labs and discussed the procedure including the risks, benefits and alternatives for the proposed anesthesia with the patient or authorized representative who has indicated his/her understanding and acceptance.   Dental advisory given  Plan Discussed with: CRNA and Anesthesiologist  Anesthesia Plan Comments:         Anesthesia Quick Evaluation

## 2014-03-27 NOTE — Transfer of Care (Signed)
Immediate Anesthesia Transfer of Care Note  [name]  Procedure(s) Performed: Procedure(s): DEBRIDEMENT AND CLOSURE RIGHT INDEX FINGER (Right)  Patient Location: PACU  Anesthesia Type:MAC  Level of Consciousness: awake, alert  and oriented  Airway & Oxygen Therapy: Patient Spontanous Breathing  Post-op Assessment: Report given to PACU RN and Post -op Vital signs reviewed and stable  Post vital signs: Reviewed and stable  Complications: No apparent anesthesia complications

## 2014-03-27 NOTE — Discharge Instructions (Signed)
KEEP BANDAGE [facility] AND DRY CALL OFFICE FOR F/U APPT [id number] in 8 days CALL FOR THERAPY APPOINTMENT IN 8 DAYS ALSO [id number] EXT 61 KEEP HAND ELEVATED ABOVE HEART OK TO APPLY ICE TO OPERATIVE AREA CONTACT OFFICE IF ANY WORSENING PAIN OR CONCERNS.

## 2014-03-27 NOTE — ED Provider Notes (Signed)
CSN: [id number]     Arrival date & time 03/27/14  1256 History   First MD Initiated Contact with Patient 03/27/14 1435     Chief Complaint  Patient presents with  . Finger Injury     (Consider location/radiation/quality/duration/timing/severity/associated sxs/prior Treatment) Patient is a 68 y.o. male presenting with hand pain. The history is provided by the patient.  Hand Pain This is a new problem. Episode onset: 3 hrs ago. The problem occurs constantly. The problem has not changed since onset.Pertinent negatives include no chest pain, no abdominal pain, no headaches and no shortness of breath. Nothing aggravates the symptoms. Nothing relieves the symptoms. He has tried nothing for the symptoms. The treatment provided moderate relief.    Past Medical History  Diagnosis Date  . DDD (degenerative disc disease), lumbar     Dr. Carloyn Manner  . Mixed hyperlipidemia   . Lumbago   . History of gout   . GERD (gastroesophageal reflux disease)   . Essential hypertension, benign   . Coronary atherosclerosis of native coronary artery     Multivessel status post CABG March 2015  . History of [facility] spotted fever   . Hemorrhoids   . Cataract   . Hard of hearing   . Diabetes mellitus type 2, diet-controlled    Past Surgical History  Procedure Laterality Date  . Total knee arthroplasty Left   . Bicept tenodesis  11/26/2011    Procedure: BICEPT TENODESIS;  Surgeon: Augustin Schooling, MD;  Location: [place];  Service: Orthopedics;  Laterality: Left;  . Left shoulder rotator cuff repair    . Lithotripsy    . Cataract surgery Left   . Vasectomy    . Colonoscopy    . Coronary artery bypass graft N/A 10/19/2013    Procedure: CORONARY ARTERY BYPASS GRAFTING (CABG);  Surgeon: Rexene Alberts, MD;  Location: [place];  Service: Open Heart Surgery;  Laterality: N/A;  CABG times four using left internal mammary artery and left leg saphenous vein, incision made on right leg but no vein removed  .  Intraoperative transesophageal echocardiogram N/A 10/19/2013    Procedure: INTRAOPERATIVE TRANSESOPHAGEAL ECHOCARDIOGRAM;  Surgeon: Rexene Alberts, MD;  Location: [place];  Service: Open Heart Surgery;  Laterality: N/A;   Family History  Problem Relation Age of Onset  . Lung cancer Father   . Heart failure Mother   . Hypertension Sister    History  Substance Use Topics  . Smoking status: Former Smoker -- 2.00 packs/day for 30 years    Types: Cigarettes  . Smokeless tobacco: Former Systems developer    Types: Chew     Comment: quit smoking 22yrs ago  . Alcohol Use: Yes     Comment: Occassional    Review of Systems  Constitutional: Negative for fever.  HENT: Negative for drooling and rhinorrhea.   Eyes: Negative for pain.  Respiratory: Negative for cough and shortness of breath.   Cardiovascular: Negative for chest pain and leg swelling.  Gastrointestinal: Negative for nausea, vomiting, abdominal pain and diarrhea.  Genitourinary: Negative for dysuria and hematuria.  Musculoskeletal: Negative for gait problem and neck pain.  Skin: Negative for color change.  Neurological: Negative for numbness and headaches.  Hematological: Negative for adenopathy.  Psychiatric/Behavioral: Negative for behavioral problems.  All other systems reviewed and are negative.     Allergies  Review of patient's allergies indicates no known allergies.  Home Medications   Prior to Admission medications   Medication Sig Start Date End Date  Taking? Authorizing Provider  acetaminophen (TYLENOL) 500 MG tablet Take 1,000 mg by mouth every 6 (six) hours as needed for mild pain. For pain   Yes Historical Provider, MD  allopurinol (ZYLOPRIM) 300 MG tablet Take 300 mg by mouth daily.   Yes Historical Provider, MD  aspirin EC 325 MG EC tablet Take 1 tablet (325 mg total) by mouth daily. 10/23/13  Yes Donielle Liston Alba, PA-C  Glucosamine-Chondroitin (OSTEO BI-FLEX REGULAR STRENGTH PO) Take 1 tablet by mouth daily.   Yes  Historical Provider, MD  hydrochlorothiazide (HYDRODIURIL) 50 MG tablet Take 50 mg by mouth daily.   Yes Historical Provider, MD  meloxicam (MOBIC) 7.5 MG tablet Take 7.5 mg by mouth every evening.   Yes Historical Provider, MD  metoprolol tartrate (LOPRESSOR) 25 MG tablet Take 25 mg by mouth 2 (two) times daily.   Yes Historical Provider, MD  Multiple Vitamin (MULITIVITAMIN WITH MINERALS) TABS Take 1 tablet by mouth daily.   Yes Historical Provider, MD  Omega-3 Fatty Acids (FISH OIL) 1200 MG CAPS Take 1 capsule by mouth daily.   Yes Historical Provider, MD  omeprazole (PRILOSEC) 20 MG capsule Take 20 mg by mouth daily.   Yes Historical Provider, MD  potassium chloride (K-DUR,KLOR-CON) 10 MEQ tablet Take 10 mEq by mouth daily.   Yes Historical Provider, MD  pravastatin (PRAVACHOL) 40 MG tablet Take 40 mg by mouth every evening.    Yes Historical Provider, MD  triamcinolone cream (KENALOG) 0.1 % Apply 1 application topically daily as needed (rash).    Yes Historical Provider, MD   BP 150/100  Pulse 60  Temp(Src) 98 F (36.7 C) (Oral)  Resp 16  SpO2 98% Physical Exam  Nursing note and vitals reviewed. Constitutional: He is oriented to person, place, and time. He appears well-developed and well-nourished.  HENT:  Head: Normocephalic and atraumatic.  Right Ear: External ear normal.  Left Ear: External ear normal.  Nose: Nose normal.  Mouth/Throat: Oropharynx is clear and moist. No oropharyngeal exudate.  Eyes: Conjunctivae and EOM are normal. Pupils are equal, round, and reactive to light.  Neck: Normal range of motion. Neck supple.  Cardiovascular: Normal rate, regular rhythm, normal heart sounds and intact distal pulses.  Exam reveals no gallop and no friction rub.   No murmur heard. Pulmonary/Chest: Effort normal and breath sounds normal. No respiratory distress. He has no wheezes.  Abdominal: Soft. Bowel sounds are normal. He exhibits no distension. There is no tenderness. There is no  rebound and no guarding.  Musculoskeletal: Normal range of motion. He exhibits no edema and no tenderness.  Amputation of the distal phalanx of the right index finger. The wound is hemostatic. The tuft of the distal phalanx is just barely visible. The patient otherwise has normal sensation and normal motor skills in the right hand and right upper extremity. 2+ distal and proximal pulses in the right upper extremity.  Neurological: He is alert and oriented to person, place, and time.  Skin: Skin is warm and dry.  Psychiatric: He has a normal mood and affect. His behavior is normal.    ED Course  Procedures (including critical care time) Labs Review Labs Reviewed  CBC WITH DIFFERENTIAL - Abnormal; Notable for the following:    WBC 11.9 (*)    Neutrophils Relative % 81 (*)    Neutro Abs 9.7 (*)    Lymphocytes Relative 11 (*)    All other components within normal limits  BASIC METABOLIC PANEL - Abnormal; Notable for the  following:    Glucose, Bld 125 (*)    BUN 33 (*)    All other components within normal limits  GLUCOSE, CAPILLARY - Abnormal; Notable for the following:    Glucose-Capillary 109 (*)    All other components within normal limits    Imaging Review Dg Chest 2 View  03/27/2014   CLINICAL DATA:  Preoperative exam prior to finger surgery ; history of CABG and previous tobacco use  EXAM: CHEST  2 VIEW  COMPARISON:  PA and lateral chest of November 12, 2013  FINDINGS: The lungs are adequately inflated. There is no focal infiltrate. The heart and pulmonary vascularity are normal. The patient has undergone previous median sternotomy. Two bypass graft rings are visible. There is tortuosity of the descending thoracic aorta. The bony thorax exhibits no acute abnormalities.  IMPRESSION: There is no acute cardiopulmonary abnormality.   Electronically Signed   By: David  Martinique   On: 03/27/2014 17:25   Dg Finger Index Right  03/27/2014   CLINICAL DATA:  Subtle plate injury of the distal  phalanx of the right index finger  EXAM: RIGHT INDEX FINGER 2+V  COMPARISON:  None.  FINDINGS: The patient has sustained amputation of the soft tissues of the tip of the index finger. The tuft of the distal phalanx may be minimally involved but the distal phalanx is largely intact. More proximally the digit is unremarkable.  IMPRESSION: There is partial amputation of the soft tissues of the tip of the right index finger with possible minimal involvement of the tuft. This would be considered an open fracture.   Electronically Signed   By: David  Martinique   On: 03/27/2014 13:41     EKG Interpretation   Date/Time:  Wednesday March 27 2014 15:56:36 EDT Ventricular Rate:  49 PR Interval:  183 QRS Duration: 112 QT Interval:  473 QTC Calculation: 427 R Axis:   36 Text Interpretation:  Sinus bradycardia Left ventricular hypertrophy  Inferior infarct, old No significant change since last tracing Confirmed  by Jaeli Grubb  MD, Krisann Mckenna ([id number]) on 03/27/2014 4:03:02 PM            MDM   Final diagnoses:  Traumatic amputation of other finger(s) (complete) (partial), without mention of complication    4:58 PM 68 y.o. male w hx of CAD s/p CABG on ASA who presents with a right hand injury which occurred prior to arrival. He states that at approximately noon today he was using a saw when he accidentally got his finger to close to it. He suffered an amputation of the distal phalanx of the right index finger. He is otherwise neurovascularly intact on exam. Plain film shows possible minimal involvement of the tuft. Will tx with Ancef. His last Tdap was in January of 2014. Pain is controlled after a Percocet. Will touch base with the hand surgeon on call. Pt is right handed.   Dr. Apolonio Schneiders to take the pt to the OR from the ED.     Pamella Pert, MD 03/28/14 [phone]

## 2014-03-27 NOTE — H&P (Signed)
Brandon [name] is an 68 y.o. male.   Chief Complaint: Right index finger circular saw injury  HPI: ED notes reviewed and document history Pt with open circular saw injury Pt here for surgery on right index finger No prior surgery to right index finger  Past Medical History  Diagnosis Date  . DDD (degenerative disc disease), lumbar     Dr. Carloyn Manner  . Mixed hyperlipidemia   . Lumbago   . History of gout   . GERD (gastroesophageal reflux disease)   . Essential hypertension, benign   . Coronary atherosclerosis of native coronary artery     Multivessel status post CABG March 2015  . History of [facility] spotted fever   . Hemorrhoids   . Cataract   . Hard of hearing   . Diabetes mellitus type 2, diet-controlled     Past Surgical History  Procedure Laterality Date  . Total knee arthroplasty Left   . Bicept tenodesis  11/26/2011    Procedure: BICEPT TENODESIS;  Surgeon: Augustin Schooling, MD;  Location: [place];  Service: Orthopedics;  Laterality: Left;  . Left shoulder rotator cuff repair    . Lithotripsy    . Cataract surgery Left   . Vasectomy    . Colonoscopy    . Coronary artery bypass graft N/A 10/19/2013    Procedure: CORONARY ARTERY BYPASS GRAFTING (CABG);  Surgeon: Rexene Alberts, MD;  Location: [place];  Service: Open Heart Surgery;  Laterality: N/A;  CABG times four using left internal mammary artery and left leg saphenous vein, incision made on right leg but no vein removed  . Intraoperative transesophageal echocardiogram N/A 10/19/2013    Procedure: INTRAOPERATIVE TRANSESOPHAGEAL ECHOCARDIOGRAM;  Surgeon: Rexene Alberts, MD;  Location: [place];  Service: Open Heart Surgery;  Laterality: N/A;    Family History  Problem Relation Age of Onset  . Lung cancer Father   . Heart failure Mother   . Hypertension Sister    Social History:  reports that he has quit smoking. His smoking use included Cigarettes. He has a 60 pack-year smoking history. He has quit using smokeless tobacco.  His smokeless tobacco use included Chew. He reports that he drinks alcohol. He reports that he does not use illicit drugs.  Allergies: No Known Allergies   (Not in a hospital admission)  No results found for this or any previous visit (from the past 48 hour(s)). Dg Finger Index Right  03/27/2014   CLINICAL DATA:  Subtle plate injury of the distal phalanx of the right index finger  EXAM: RIGHT INDEX FINGER 2+V  COMPARISON:  None.  FINDINGS: The patient has sustained amputation of the soft tissues of the tip of the index finger. The tuft of the distal phalanx may be minimally involved but the distal phalanx is largely intact. More proximally the digit is unremarkable.  IMPRESSION: There is partial amputation of the soft tissues of the tip of the right index finger with possible minimal involvement of the tuft. This would be considered an open fracture.   Electronically Signed   By: David  Martinique   On: 03/27/2014 13:41    ROS NO RECENT ILLNESSES OR COMPLICATIONS FROM HEART BYPASS SURGERY IN [facility]  Blood pressure 150/100, pulse 60, temperature 98 F (36.7 C), temperature source Oral, resp. rate 16, SpO2 98.00%. Physical Exam  General Appearance:  Alert, cooperative, no distress, appears stated age  Head:  Normocephalic, without obvious abnormality, atraumatic  Eyes:  Pupils equal, conjunctiva/corneas clear,  Throat: Lips, mucosa, and tongue normal; teeth and gums normal  Neck: No visible masses     Lungs:   respirations unlabored  Chest Wall:  No tenderness or deformity  Heart:  Regular rate and rhythm,  Abdomen:   Soft, non-tender,         Extremities: RIGHT HAND NO WOUNDS TO LONG/RING/SMALL FINGERS. INDEX FINGER WOUND PHOTOS IN ED NOTES  Pulses: 2+ and symmetric  Skin: Skin color, texture, turgor normal, no rashes or lesions     Neurologic: Normal      Assessment/Plan RIGHT INDEX FINGER OPEN DISTAL PHALANX FRACTURE AND SOFT TISSUE LOSS  RIGHT INDEX FINGER DEBRIDEMENT  AND ADVANCEMENT FLAP CLOSURE  R/B/A DISCUSSED WITH PT IN HOSPITAL.  PT VOICED UNDERSTANDING OF PLAN CONSENT SIGNED DAY OF SURGERY PT SEEN AND EXAMINED PRIOR TO OPERATIVE PROCEDURE/DAY OF SURGERY SITE MARKED. QUESTIONS ANSWERED WILL GO HOME FOLLOWING SURGERY  WE ARE PLANNING SURGERY FOR YOUR UPPER EXTREMITY. THE RISKS AND BENEFITS OF SURGERY INCLUDE BUT NOT LIMITED TO BLEEDING INFECTION, DAMAGE TO NEARBY NERVES ARTERIES TENDONS, FAILURE OF SURGERY TO ACCOMPLISH ITS INTENDED GOALS, PERSISTENT SYMPTOMS AND NEED FOR FURTHER SURGICAL INTERVENTION. WITH THIS IN MIND WE WILL PROCEED. I HAVE DISCUSSED WITH THE PATIENT THE PRE AND POSTOPERATIVE REGIMEN AND THE DOS AND DON'TS. PT VOICED UNDERSTANDING AND INFORMED CONSENT SIGNED.  [name] 03/27/2014, 4:09 PM

## 2014-03-28 ENCOUNTER — Encounter (HOSPITAL_COMMUNITY): Payer: Self-pay | Admitting: Orthopedic Surgery

## 2014-03-28 NOTE — Op Note (Signed)
NAME:  GERRARD, CRYSTAL NO.:  [id number]  MEDICAL RECORD NO.:  [id number]  LOCATION:  [facility]                         FACILITY:  [place]  PHYSICIAN:  Melrose Nakayama, MD  DATE OF BIRTH:  [birth year]  DATE OF PROCEDURE:  03/27/2014 DATE OF DISCHARGE:  03/27/2014                              OPERATIVE REPORT   PREOPERATIVE DIAGNOSIS:  Right index finger traumatic open distal phalanx fracture.  POSTOPERATIVE DIAGNOSIS:  Right index finger traumatic open distal phalanx fracture.  ATTENDING PHYSICIAN:  Melrose Nakayama, MD, who scrubbed and present for the entire procedure.  ASSISTANT SURGEON:  None.  ANESTHESIA:  1% Xylocaine, 4% Marcaine, local block.  PROCEDURE: 1. Open treatment of right index finger distal phalanx fracture     without an internal fixation. 2. Open debridement of skin, subcutaneous tissue, and bone associated     with open distal phalangeal fracture, right index finger. 3. Right index finger advancement flap closure.  V-Y advancement flap.  SURGICAL INDICATIONS:  Mr. Molyneux is a right-hand-dominant gentleman who was working on a circular saw sustaining a closed injury to his right index finger.  The patient was seen and evaluated in the hospital and recommended to undergo the above procedure.  Risks, benefits, and alternatives were discussed in detail with the patient.  Signed informed consent was obtained.  Risks include, but not limited to bleeding, infection, damage to nearby nerves, arteries, or tendons, loss of motion of wrist and digits, incomplete relief of symptoms, and need for further surgical intervention.  DESCRIPTION OF PROCEDURE:  The patient was properly identified in the preoperative holding area and marked with a permanent marker around right index finger to indicate the correct operative site.  The patient was then brought back to the operating room, placed supine on the anesthesia table.  General anesthesia was  administered.  Already IV sedation was administered.  A well-padded tourniquet placed on the right forearm and sealed with 1000-drape.  The right upper extremity was then prepped and draped in normal sterile fashion after the local anesthetic had been administered.  Time-out was called, correct site was identified, and procedure then begun.  Attention was then turned to the right index finger.  Shortening of the bone was then carried out from that open distal phalanx fracture.  Excisional debridement was then carried out sharply with nice rongeurs and a small curettes.  Excisional debridement was then carried out circumferentially of skin, subcutaneous tissue, and bone.  After excisional debridement and shortening of the bone, attention was then turned to the closure of the open defect.  V-Y volar advancement flap was then carried out all the way to the distal interphalangeal joint.  This was brought and advanced volarly from a volar to dorsal direction.  This was then closed using simple chromic sutures.  Adaptic dressing, sterile compressive bandage then applied. The patient was placed in a small finger dressing.  Taken to recovery room in good condition.  POSTPROCEDURE PLAN:  The patient was discharged home, seen back in the office approximately 8 days for wound check, suture check, a small tip protector splint, x-rays, and then begin a therapy regimen.  X-rays at the  first visit.     Melrose Nakayama, MD     FWO/MEDQ  D:  03/27/2014  T:  03/28/2014  Job:  [id number]

## 2014-04-04 DIAGNOSIS — S5290XD Unspecified fracture of unspecified forearm, subsequent encounter for closed fracture with routine healing: Secondary | ICD-10-CM | POA: Diagnosis not present

## 2014-04-04 DIAGNOSIS — Z5189 Encounter for other specified aftercare: Secondary | ICD-10-CM | POA: Diagnosis not present

## 2014-04-25 DIAGNOSIS — Z23 Encounter for immunization: Secondary | ICD-10-CM | POA: Diagnosis not present

## 2014-04-25 DIAGNOSIS — I259 Chronic ischemic heart disease, unspecified: Secondary | ICD-10-CM | POA: Diagnosis not present

## 2014-04-25 DIAGNOSIS — M1990 Unspecified osteoarthritis, unspecified site: Secondary | ICD-10-CM | POA: Diagnosis not present

## 2014-04-25 DIAGNOSIS — F528 Other sexual dysfunction not due to a substance or known physiological condition: Secondary | ICD-10-CM | POA: Diagnosis not present

## 2014-04-25 DIAGNOSIS — E785 Hyperlipidemia, unspecified: Secondary | ICD-10-CM | POA: Diagnosis not present

## 2014-05-17 DIAGNOSIS — M25461 Effusion, right knee: Secondary | ICD-10-CM | POA: Diagnosis not present

## 2014-05-17 DIAGNOSIS — M25561 Pain in right knee: Secondary | ICD-10-CM | POA: Diagnosis not present

## 2014-05-17 DIAGNOSIS — M1711 Unilateral primary osteoarthritis, right knee: Secondary | ICD-10-CM | POA: Diagnosis not present

## 2014-05-22 DIAGNOSIS — N200 Calculus of kidney: Secondary | ICD-10-CM | POA: Diagnosis not present

## 2014-05-22 DIAGNOSIS — M545 Low back pain: Secondary | ICD-10-CM | POA: Diagnosis not present

## 2014-05-23 ENCOUNTER — Other Ambulatory Visit: Payer: Self-pay | Admitting: Urology

## 2014-05-23 DIAGNOSIS — N281 Cyst of kidney, acquired: Secondary | ICD-10-CM

## 2014-05-25 DIAGNOSIS — M25561 Pain in right knee: Secondary | ICD-10-CM | POA: Diagnosis not present

## 2014-06-06 ENCOUNTER — Other Ambulatory Visit: Payer: Self-pay | Admitting: Urology

## 2014-06-06 ENCOUNTER — Ambulatory Visit (HOSPITAL_COMMUNITY)
Admission: RE | Admit: 2014-06-06 | Discharge: 2014-06-06 | Disposition: A | Discharge status: Home / Self Care | Payer: Medicare Other | Source: Ambulatory Visit | Attending: Urology | Admitting: Urology

## 2014-06-06 DIAGNOSIS — K760 Fatty (change of) liver, not elsewhere classified: Secondary | ICD-10-CM | POA: Insufficient documentation

## 2014-06-06 DIAGNOSIS — N289 Disorder of kidney and ureter, unspecified: Secondary | ICD-10-CM | POA: Diagnosis present

## 2014-06-06 DIAGNOSIS — N281 Cyst of kidney, acquired: Secondary | ICD-10-CM | POA: Diagnosis not present

## 2014-06-06 LAB — POCT I-STAT CREATININE: Creatinine, Ser: 0.8 mg/dL (ref 0.50–1.35)

## 2014-06-06 MED ORDER — GADOBENATE DIMEGLUMINE 529 MG/ML IV SOLN
20.0000 mL | Freq: Once | INTRAVENOUS | Status: AC | PRN
  Administered 2014-06-06: 19 mL via INTRAVENOUS

## 2014-06-12 DIAGNOSIS — Z86018 Personal history of other benign neoplasm: Secondary | ICD-10-CM | POA: Diagnosis not present

## 2014-06-12 DIAGNOSIS — L821 Other seborrheic keratosis: Secondary | ICD-10-CM | POA: Diagnosis not present

## 2014-06-12 DIAGNOSIS — D239 Other benign neoplasm of skin, unspecified: Secondary | ICD-10-CM | POA: Diagnosis not present

## 2014-06-12 DIAGNOSIS — L570 Actinic keratosis: Secondary | ICD-10-CM | POA: Diagnosis not present

## 2014-06-12 DIAGNOSIS — Z85828 Personal history of other malignant neoplasm of skin: Secondary | ICD-10-CM | POA: Diagnosis not present

## 2014-06-12 DIAGNOSIS — L309 Dermatitis, unspecified: Secondary | ICD-10-CM | POA: Diagnosis not present

## 2014-06-20 DIAGNOSIS — M23351 Other meniscus derangements, posterior horn of lateral meniscus, right knee: Secondary | ICD-10-CM | POA: Diagnosis not present

## 2014-06-20 DIAGNOSIS — M23321 Other meniscus derangements, posterior horn of medial meniscus, right knee: Secondary | ICD-10-CM | POA: Diagnosis not present

## 2014-06-25 DIAGNOSIS — L570 Actinic keratosis: Secondary | ICD-10-CM | POA: Diagnosis not present

## 2014-07-17 DIAGNOSIS — M47816 Spondylosis without myelopathy or radiculopathy, lumbar region: Secondary | ICD-10-CM | POA: Diagnosis not present

## 2014-07-25 ENCOUNTER — Encounter (HOSPITAL_COMMUNITY): Payer: Self-pay | Admitting: Cardiovascular Disease

## 2014-07-31 DIAGNOSIS — M5136 Other intervertebral disc degeneration, lumbar region: Secondary | ICD-10-CM | POA: Diagnosis not present

## 2014-07-31 DIAGNOSIS — M5137 Other intervertebral disc degeneration, lumbosacral region: Secondary | ICD-10-CM | POA: Diagnosis not present

## 2014-07-31 DIAGNOSIS — M4316 Spondylolisthesis, lumbar region: Secondary | ICD-10-CM | POA: Diagnosis not present

## 2014-07-31 DIAGNOSIS — M47816 Spondylosis without myelopathy or radiculopathy, lumbar region: Secondary | ICD-10-CM | POA: Diagnosis not present

## 2014-07-31 DIAGNOSIS — M4806 Spinal stenosis, lumbar region: Secondary | ICD-10-CM | POA: Diagnosis not present

## 2014-08-08 ENCOUNTER — Encounter (HOSPITAL_COMMUNITY): Payer: Self-pay

## 2014-08-08 ENCOUNTER — Emergency Department (HOSPITAL_COMMUNITY): Payer: Medicare Other

## 2014-08-08 ENCOUNTER — Emergency Department (HOSPITAL_COMMUNITY)
Admission: EM | Admit: 2014-08-08 | Discharge: 2014-08-08 | Disposition: A | Discharge status: Home / Self Care | Payer: Medicare Other | Attending: Emergency Medicine | Admitting: Emergency Medicine

## 2014-08-08 DIAGNOSIS — Z8739 Personal history of other diseases of the musculoskeletal system and connective tissue: Secondary | ICD-10-CM | POA: Diagnosis not present

## 2014-08-08 DIAGNOSIS — S0990XA Unspecified injury of head, initial encounter: Secondary | ICD-10-CM

## 2014-08-08 DIAGNOSIS — K219 Gastro-esophageal reflux disease without esophagitis: Secondary | ICD-10-CM | POA: Diagnosis not present

## 2014-08-08 DIAGNOSIS — Z792 Long term (current) use of antibiotics: Secondary | ICD-10-CM | POA: Diagnosis not present

## 2014-08-08 DIAGNOSIS — S0181XA Laceration without foreign body of other part of head, initial encounter: Secondary | ICD-10-CM | POA: Insufficient documentation

## 2014-08-08 DIAGNOSIS — Z87891 Personal history of nicotine dependence: Secondary | ICD-10-CM | POA: Diagnosis not present

## 2014-08-08 DIAGNOSIS — Z951 Presence of aortocoronary bypass graft: Secondary | ICD-10-CM | POA: Diagnosis not present

## 2014-08-08 DIAGNOSIS — Z8619 Personal history of other infectious and parasitic diseases: Secondary | ICD-10-CM | POA: Diagnosis not present

## 2014-08-08 DIAGNOSIS — Z7952 Long term (current) use of systemic steroids: Secondary | ICD-10-CM | POA: Diagnosis not present

## 2014-08-08 DIAGNOSIS — Y9289 Other specified places as the place of occurrence of the external cause: Secondary | ICD-10-CM | POA: Insufficient documentation

## 2014-08-08 DIAGNOSIS — I252 Old myocardial infarction: Secondary | ICD-10-CM | POA: Insufficient documentation

## 2014-08-08 DIAGNOSIS — Z87442 Personal history of urinary calculi: Secondary | ICD-10-CM | POA: Diagnosis not present

## 2014-08-08 DIAGNOSIS — Y9389 Activity, other specified: Secondary | ICD-10-CM | POA: Diagnosis not present

## 2014-08-08 DIAGNOSIS — E119 Type 2 diabetes mellitus without complications: Secondary | ICD-10-CM | POA: Diagnosis not present

## 2014-08-08 DIAGNOSIS — Z9889 Other specified postprocedural states: Secondary | ICD-10-CM | POA: Insufficient documentation

## 2014-08-08 DIAGNOSIS — Y998 Other external cause status: Secondary | ICD-10-CM | POA: Insufficient documentation

## 2014-08-08 DIAGNOSIS — Z791 Long term (current) use of non-steroidal anti-inflammatories (NSAID): Secondary | ICD-10-CM | POA: Diagnosis not present

## 2014-08-08 DIAGNOSIS — IMO0002 Reserved for concepts with insufficient information to code with codable children: Secondary | ICD-10-CM

## 2014-08-08 DIAGNOSIS — E782 Mixed hyperlipidemia: Secondary | ICD-10-CM | POA: Diagnosis not present

## 2014-08-08 DIAGNOSIS — S0081XA Abrasion of other part of head, initial encounter: Secondary | ICD-10-CM | POA: Diagnosis not present

## 2014-08-08 DIAGNOSIS — Z7982 Long term (current) use of aspirin: Secondary | ICD-10-CM | POA: Insufficient documentation

## 2014-08-08 DIAGNOSIS — I10 Essential (primary) hypertension: Secondary | ICD-10-CM | POA: Diagnosis not present

## 2014-08-08 DIAGNOSIS — W19XXXA Unspecified fall, initial encounter: Secondary | ICD-10-CM

## 2014-08-08 DIAGNOSIS — F10120 Alcohol abuse with intoxication, uncomplicated: Secondary | ICD-10-CM | POA: Insufficient documentation

## 2014-08-08 DIAGNOSIS — W01198A Fall on same level from slipping, tripping and stumbling with subsequent striking against other object, initial encounter: Secondary | ICD-10-CM | POA: Diagnosis not present

## 2014-08-08 DIAGNOSIS — Z87448 Personal history of other diseases of urinary system: Secondary | ICD-10-CM | POA: Diagnosis not present

## 2014-08-08 DIAGNOSIS — H9190 Unspecified hearing loss, unspecified ear: Secondary | ICD-10-CM | POA: Diagnosis not present

## 2014-08-08 DIAGNOSIS — M109 Gout, unspecified: Secondary | ICD-10-CM | POA: Insufficient documentation

## 2014-08-08 HISTORY — DX: Calculus of kidney: N20.0

## 2014-08-08 MED ORDER — BACITRACIN ZINC 500 UNIT/GM EX OINT
TOPICAL_OINTMENT | CUTANEOUS | Status: AC
  Filled 2014-08-08: qty 1.8

## 2014-08-08 MED ORDER — LIDOCAINE-EPINEPHRINE (PF) 2 %-1:200000 IJ SOLN
INTRAMUSCULAR | Status: AC
  Filled 2014-08-08: qty 20

## 2014-08-08 NOTE — ED Notes (Signed)
Fell down 3-4 hardwood steps. No LOC fall witnessed by wife. Has been drinking alcohol tonight. Laceration noted to forehead

## 2014-08-08 NOTE — Discharge Instructions (Signed)
Facial Laceration ° A facial laceration is a cut on the face. These injuries can be painful and cause bleeding. Lacerations usually heal quickly, but they need special care to reduce scarring. °DIAGNOSIS  °Your health care provider will take a medical history, ask for details about how the injury occurred, and examine the wound to determine how deep the cut is. °TREATMENT  °Some facial lacerations may not require closure. Others may not be able to be closed because of an increased risk of infection. The risk of infection and the chance for successful closure will depend on various factors, including the amount of time since the injury occurred. °The wound may be cleaned to help prevent infection. If closure is appropriate, pain medicines may be given if needed. Your health care provider will use stitches (sutures), wound glue (adhesive), or skin adhesive strips to repair the laceration. These tools bring the skin edges together to allow for faster healing and a better cosmetic outcome. If needed, you may also be given a tetanus shot. °HOME CARE INSTRUCTIONS °· Only take over-the-counter or prescription medicines as directed by your health care provider. °· Follow your health care provider's instructions for wound care. These instructions will vary depending on the technique used for closing the wound. °For Sutures: °· Keep the wound clean and dry.   °· If you were given a bandage (dressing), you should change it at least once a day. Also change the dressing if it becomes wet or dirty, or as directed by your health care provider.   °· Wash the wound with soap and water 2 times a day. Rinse the wound off with water to remove all soap. Pat the wound dry with a clean towel.   °· After cleaning, apply a thin layer of the antibiotic ointment recommended by your health care provider. This will help prevent infection and keep the dressing from sticking.   °· You may shower as usual after the first 24 hours. Do not soak the  wound in water until the sutures are removed.   °· Get your sutures removed as directed by your health care provider. With facial lacerations, sutures should usually be taken out after 4-5 days to avoid stitch marks.   °· Wait a few days after your sutures are removed before applying any makeup. °For Skin Adhesive Strips: °· Keep the wound clean and dry.   °· Do not get the skin adhesive strips wet. You may bathe carefully, using caution to keep the wound dry.   °· If the wound gets wet, pat it dry with a clean towel.   °· Skin adhesive strips will fall off on their own. You may trim the strips as the wound heals. Do not remove skin adhesive strips that are still stuck to the wound. They will fall off in time.   °For Wound Adhesive: °· You may briefly wet your wound in the shower or bath. Do not soak or scrub the wound. Do not swim. Avoid periods of heavy sweating until the skin adhesive has fallen off on its own. After showering or bathing, gently pat the wound dry with a clean towel.   °· Do not apply liquid medicine, cream medicine, ointment medicine, or makeup to your wound while the skin adhesive is in place. This may loosen the film before your wound is healed.   °· If a dressing is placed over the wound, be careful not to apply tape directly over the skin adhesive. This may cause the adhesive to be pulled off before the wound is healed.   °· Avoid   prolonged exposure to sunlight or tanning lamps while the skin adhesive is in place. °· The skin adhesive will usually remain in place for 5-10 days, then naturally fall off the skin. Do not pick at the adhesive film.   °After Healing: °Once the wound has healed, cover the wound with sunscreen during the day for 1 full year. This can help minimize scarring. Exposure to ultraviolet light in the first year will darken the scar. It can take 1-2 years for the scar to lose its redness and to heal completely.  °SEEK IMMEDIATE MEDICAL CARE IF: °· You have redness, pain, or  swelling around the wound.   °· You see a yellowish-white fluid (pus) coming from the wound.   °· You have chills or a fever.   °MAKE SURE YOU: °· Understand these instructions. °· Will watch your condition. °· Will get help right away if you are not doing well or get worse. °Document Released: 09/09/2004 Document Revised: 05/23/2013 Document Reviewed: 03/15/2013 °Exit[employer]® Patient Information ©2015 [employer]. This information is not intended to replace advice given to you by your health care provider. Make sure you discuss any questions you have with your health care provider. °Head Injury °You have received a head injury. It does not appear serious at this time. Headaches and vomiting are common following head injury. It should be easy to awaken from sleeping. Sometimes it is necessary for you to stay in the emergency department for a while for observation. Sometimes admission to the hospital may be needed. After injuries such as yours, most problems occur within the first 24 hours, but side effects may occur up to 7-10 days after the injury. It is important for you to carefully monitor your condition and contact your health care provider or seek immediate medical care if there is a change in your condition. °WHAT ARE THE TYPES OF HEAD INJURIES? °Head injuries can be as minor as a bump. Some head injuries can be more severe. More severe head injuries include: °· A jarring injury to the brain (concussion). °· A bruise of the brain (contusion). This mean there is bleeding in the brain that can cause swelling. °· A cracked skull (skull fracture). °· Bleeding in the brain that collects, clots, and forms a bump (hematoma). °WHAT CAUSES A HEAD INJURY? °A serious head injury is most likely to happen to someone who is in a car wreck and is not wearing a seat belt. Other causes of major head injuries include bicycle or motorcycle accidents, sports injuries, and falls. °HOW ARE HEAD INJURIES DIAGNOSED? °A complete  history of the event leading to the injury and your current symptoms will be helpful in diagnosing head injuries. Many times, pictures of the brain, such as CT or MRI are needed to see the extent of the injury. Often, an overnight hospital stay is necessary for observation.  °WHEN SHOULD I SEEK IMMEDIATE MEDICAL CARE?  °You should get help right away if: °· You have confusion or drowsiness. °· You feel sick to your stomach (nauseous) or have continued, forceful vomiting. °· You have dizziness or unsteadiness that is getting worse. °· You have severe, continued headaches not relieved by medicine. Only take over-the-counter or prescription medicines for pain, fever, or discomfort as directed by your health care provider. °· You do not have normal function of the arms or legs or are unable to walk. °· You notice changes in the black spots in the center of the colored part of your eye (pupil). °· You have a clear or   bloody fluid coming from your nose or ears. °· You have a loss of vision. °During the next 24 hours after the injury, you must stay with someone who can watch you for the warning signs. This person should contact local emergency services (911 in the [place] if you have seizures, you become unconscious, or you are unable to wake up. °HOW CAN I PREVENT A HEAD INJURY IN THE FUTURE? °The most important factor for preventing major head injuries is avoiding motor vehicle accidents.  To minimize the potential for damage to your head, it is crucial to wear seat belts while riding in motor vehicles. Wearing helmets while bike riding and playing collision sports (like football) is also helpful. Also, avoiding dangerous activities around the house will further help reduce your risk of head injury.  °WHEN CAN I RETURN TO NORMAL ACTIVITIES AND ATHLETICS? °You should be reevaluated by your health care provider before returning to these activities. If you have any of the following symptoms, you should not return to  activities or contact sports until 1 week after the symptoms have stopped: °· Persistent headache. °· Dizziness or vertigo. °· Poor attention and concentration. °· Confusion. °· Memory problems. °· Nausea or vomiting. °· Fatigue or tire easily. °· Irritability. °· Intolerant of bright lights or loud noises. °· Anxiety or depression. °· Disturbed sleep. °MAKE SURE YOU:  °· Understand these instructions. °· Will watch your condition. °· Will get help right away if you are not doing well or get worse. °Document Released: 08/02/2005 Document Revised: 08/07/2013 Document Reviewed: 04/09/2013 °ExitCare® Patient Information ©2015 [employer]. This information is not intended to replace advice given to you by your health care provider. Make sure you discuss any questions you have with your health care provider. ° °

## 2014-08-08 NOTE — ED Provider Notes (Signed)
CSN: [id number]     Arrival date & time 08/08/14  0003 History   First MD Initiated Contact with Patient 08/08/14 0019     Chief Complaint  Patient presents with  . Facial Laceration     (Consider location/radiation/quality/duration/timing/severity/associated sxs/prior Treatment) The history is provided by the patient and the spouse.   Brandon [name] is a 68 y.o. male presenting with fall and forehead laceration.  He is currently intoxicated,  Wife at bedside states he has been drinking etoh tonight.  They were both walking down their basement steps when he sped up going down the last 4, tripped and hit his forehead either on the step edge or the hardwood floor, she is unsure.  He did not have any loc.  He was able to ambulate after the event.  He denies other injury at this time, denies neck, back, chest and hip pain.  He had one episode of emesis after arrival here.  Pressure was applied to the wound and hemostasis was obtained.  He is current with his tetanus.     Past Medical History  Diagnosis Date  . DDD (degenerative disc disease), lumbar     Dr. Carloyn Manner  . Mixed hyperlipidemia   . Lumbago   . History of gout   . GERD (gastroesophageal reflux disease)   . Essential hypertension, benign   . Coronary atherosclerosis of native coronary artery     Multivessel status post CABG March 2015  . History of [facility] spotted fever   . Hemorrhoids   . Cataract   . Hard of hearing   . Diabetes mellitus type 2, diet-controlled   . Renal disorder   . Renal calculus or stone 05/22/14   Past Surgical History  Procedure Laterality Date  . Total knee arthroplasty Left   . Bicept tenodesis  11/26/2011    Procedure: BICEPT TENODESIS;  Surgeon: Augustin Schooling, MD;  Location: [place];  Service: Orthopedics;  Laterality: Left;  . Left shoulder rotator cuff repair    . Lithotripsy    . Cataract surgery Left   . Vasectomy    . Colonoscopy    . Coronary artery bypass graft N/A 10/19/2013     Procedure: CORONARY ARTERY BYPASS GRAFTING (CABG);  Surgeon: Rexene Alberts, MD;  Location: [place];  Service: Open Heart Surgery;  Laterality: N/A;  CABG times four using left internal mammary artery and left leg saphenous vein, incision made on right leg but no vein removed  . Intraoperative transesophageal echocardiogram N/A 10/19/2013    Procedure: INTRAOPERATIVE TRANSESOPHAGEAL ECHOCARDIOGRAM;  Surgeon: Rexene Alberts, MD;  Location: [place];  Service: Open Heart Surgery;  Laterality: N/A;  . Amputation Right 03/27/2014    Procedure: DEBRIDEMENT AND CLOSURE RIGHT INDEX FINGER;  Surgeon: [name], MD;  Location: [place];  Service: Orthopedics;  Laterality: Right;  . Left heart catheterization with coronary angiogram N/A 10/09/2013    Procedure: LEFT HEART CATHETERIZATION WITH CORONARY ANGIOGRAM;  Surgeon: Burnell Blanks, MD;  Location: [facility] CATH LAB;  Service: Cardiovascular;  Laterality: N/A;  . Joint replacement     Family History  Problem Relation Age of Onset  . Lung cancer Father   . Heart failure Mother   . Hypertension Sister    History  Substance Use Topics  . Smoking status: Former Smoker -- 2.00 packs/day for 30 years    Types: Cigarettes  . Smokeless tobacco: Former Systems developer    Types: Chew     Comment:  quit smoking 86yrs ago  . Alcohol Use: Yes     Comment: Occassional    Review of Systems  Constitutional: Negative for fever.  HENT: Negative for congestion and sore throat.   Eyes: Negative.   Respiratory: Negative for chest tightness and shortness of breath.   Cardiovascular: Negative for chest pain.  Gastrointestinal: Positive for nausea and vomiting. Negative for abdominal pain.  Genitourinary: Negative.   Musculoskeletal: Negative for joint swelling, arthralgias and neck pain.  Skin: Positive for wound. Negative for rash.  Neurological: Positive for headaches. Negative for dizziness, weakness, light-headedness and numbness.  Psychiatric/Behavioral: Negative.        Allergies  Review of patient's allergies indicates no known allergies.  Home Medications   Prior to Admission medications   Medication Sig Start Date End Date Taking? Authorizing Provider  acetaminophen (TYLENOL) 500 MG tablet Take 1,000 mg by mouth every 6 (six) hours as needed for mild pain. For pain    Historical Provider, MD  allopurinol (ZYLOPRIM) 300 MG tablet Take 300 mg by mouth daily.    Historical Provider, MD  aspirin EC 325 MG EC tablet Take 1 tablet (325 mg total) by mouth daily. 10/23/13   Donielle Liston Alba, PA-C  cephALEXin (KEFLEX) 500 MG capsule Take 1 capsule (500 mg total) by mouth 4 (four) times daily. 03/27/14   [name], MD  docusate sodium (COLACE) 100 MG capsule Take 1 capsule (100 mg total) by mouth 2 (two) times daily. 03/27/14   [name], MD  Glucosamine-Chondroitin (OSTEO BI-FLEX REGULAR STRENGTH PO) Take 1 tablet by mouth daily.    Historical Provider, MD  hydrochlorothiazide (HYDRODIURIL) 50 MG tablet Take 50 mg by mouth daily.    Historical Provider, MD  meloxicam (MOBIC) 7.5 MG tablet Take 7.5 mg by mouth every evening.    Historical Provider, MD  metoprolol tartrate (LOPRESSOR) 25 MG tablet Take 25 mg by mouth 2 (two) times daily.    Historical Provider, MD  Multiple Vitamin (MULITIVITAMIN WITH MINERALS) TABS Take 1 tablet by mouth daily.    Historical Provider, MD  Omega-3 Fatty Acids (FISH OIL) 1200 MG CAPS Take 1 capsule by mouth daily.    Historical Provider, MD  omeprazole (PRILOSEC) 20 MG capsule Take 20 mg by mouth daily.    Historical Provider, MD  oxyCODONE-acetaminophen (ROXICET) 5-325 MG per tablet Take 1 tablet by mouth every 4 (four) hours as needed for severe pain. 03/27/14   [name], MD  potassium chloride (K-DUR,KLOR-CON) 10 MEQ tablet Take 10 mEq by mouth daily.    Historical Provider, MD  pravastatin (PRAVACHOL) 40 MG tablet Take 40 mg by mouth every evening.     Historical Provider, MD  triamcinolone cream (KENALOG)  0.1 % Apply 1 application topically daily as needed (rash).     Historical Provider, MD   BP 155/85 mmHg  Pulse 76  Temp(Src) 97.8 F (36.6 C) (Oral)  Resp 17  Ht 6' (1.829 m)  Wt 210 lb (95.255 kg)  BMI 28.47 kg/m2  SpO2 96% Physical Exam  Constitutional: He appears well-developed and well-nourished.  HENT:  Head: Normocephalic and atraumatic.  Right Ear: No hemotympanum.  Left Ear: No hemotympanum.  Nose: Nose normal.  Mouth/Throat: Oropharynx is clear and moist.  Eyes: Conjunctivae and EOM are normal. Pupils are equal, round, and reactive to light.  Neck: Normal range of motion.  Cardiovascular: Normal rate, regular rhythm, normal heart sounds and intact distal pulses.   Pulmonary/Chest: Effort normal and breath  sounds normal. He has no wheezes. He exhibits no tenderness.  Abdominal: Soft. Bowel sounds are normal. There is no tenderness.  Musculoskeletal: Normal range of motion.  Neurological: He is alert. He has normal strength. No cranial nerve deficit or sensory deficit.  Unsteady gait due to intoxication.    Skin: Skin is warm and dry.  5 cm laceration, stellate, left forehead, hemostatic, subcutaneous.  Psychiatric: He has a normal mood and affect.  Nursing note and vitals reviewed.   ED Course  Procedures (including critical care time)  LACERATION REPAIR Performed by: Evalee Jefferson Authorized by: Evalee Jefferson Consent: Verbal consent obtained. Risks and benefits: risks, benefits and alternatives were discussed Consent given by: patient Patient identity confirmed: provided demographic data Prepped and Draped in normal sterile fashion Wound explored  Laceration Location: forehead  Laceration Length: 5cm  No Foreign Bodies seen or palpated  Anesthesia: local infiltration  Local anesthetic: lidocaine 2% with epinephrine  Anesthetic total: 4 ml  Irrigation method: syringe Amount of cleaning: standard  Skin closure: vicryl rapide for subc sutures.  ethilon  6-0 for superficial sutures  Number of sutures: #2 subc stitches,  #11 superficial sutures  Technique: simple interrupted.  Patient tolerance: Patient tolerated the procedure well with no immediate complications.  Labs Review Labs Reviewed - No data to display  Imaging Review Ct Head Wo Contrast  08/08/2014   CLINICAL DATA:  Fall, forehead abrasions  EXAM: CT HEAD WITHOUT CONTRAST  CT CERVICAL SPINE WITHOUT CONTRAST  TECHNIQUE: Multidetector CT imaging of the head and cervical spine was performed following the standard protocol without intravenous contrast. Multiplanar CT image reconstructions of the cervical spine were also generated.  COMPARISON:  None.  FINDINGS: CT HEAD FINDINGS  Mild diffuse cortical volume loss noted with proportional ventricular prominence. Mild motion artifact noted at the skull vertex. Mild periventricular white matter hypodensity is compatible with small vessel ischemic change. Vertebrobasilar dolichoectasia is noted. No acute hemorrhage, infarct, or mass lesion is identified. Mild ethmoid mucoperiosteal thickening noted. Left forehead laceration and soft tissue swelling noted. No underlying skull fracture.  CT CERVICAL SPINE FINDINGS  C1 through the cervicothoracic junction is visualized in its entirety. No precervical soft tissue widening. Mild central endplate loss of height is noted at multiple levels without overt compression deformity. Mild disc degenerative change noted at C6-C7 without significant neural foraminal narrowing. No fracture or dislocation. Mild mid cervical facet osteoarthritic change.  IMPRESSION: Left forehead scalp laceration/swelling without acute underlying intracranial abnormality.  No acute cervical spine fracture or dislocation.   Electronically Signed   By: Conchita Paris M.D.   On: 08/08/2014 01:14   Ct Cervical Spine Wo Contrast  08/08/2014   CLINICAL DATA:  Fall, forehead abrasions  EXAM: CT HEAD WITHOUT CONTRAST  CT CERVICAL SPINE  WITHOUT CONTRAST  TECHNIQUE: Multidetector CT imaging of the head and cervical spine was performed following the standard protocol without intravenous contrast. Multiplanar CT image reconstructions of the cervical spine were also generated.  COMPARISON:  None.  FINDINGS: CT HEAD FINDINGS  Mild diffuse cortical volume loss noted with proportional ventricular prominence. Mild motion artifact noted at the skull vertex. Mild periventricular white matter hypodensity is compatible with small vessel ischemic change. Vertebrobasilar dolichoectasia is noted. No acute hemorrhage, infarct, or mass lesion is identified. Mild ethmoid mucoperiosteal thickening noted. Left forehead laceration and soft tissue swelling noted. No underlying skull fracture.  CT CERVICAL SPINE FINDINGS  C1 through the cervicothoracic junction is visualized in its entirety. No precervical soft tissue  widening. Mild central endplate loss of height is noted at multiple levels without overt compression deformity. Mild disc degenerative change noted at C6-C7 without significant neural foraminal narrowing. No fracture or dislocation. Mild mid cervical facet osteoarthritic change.  IMPRESSION: Left forehead scalp laceration/swelling without acute underlying intracranial abnormality.  No acute cervical spine fracture or dislocation.   Electronically Signed   By: Conchita Paris M.D.   On: 08/08/2014 01:14     EKG Interpretation   Date/Time:  Thursday August 08 2014 00:26:22 EST Ventricular Rate:  71 PR Interval:  214 QRS Duration: 113 QT Interval:  414 QTC Calculation: 450 R Axis:   37 Text Interpretation:  Sinus rhythm Borderline prolonged PR interval  Inferior infarct, age indeterminate Baseline wander in lead(s) V6 When  compared with ECG of 03/27/2014, No significant change was found Confirmed  by [facility]  MD, DAVID ([id number]) on 08/08/2014 12:29:55 AM      MDM   Final diagnoses:  Fall  Minor head injury without loss of consciousness,  initial encounter  Facial laceration, initial encounter  Intoxication    Pt has wife and son here who will assist patient home and feel comfortable doing this.  He was given minor head injury instructions.  Wound care instructions given.  Pt advised to have sutures removed in 5 days,  Return here sooner for any signs of infection including redness, swelling, worse pain or drainage of pus.  Pt vomited once upon arrival, no persistent vomiting.  No nausea at time of dc.       Evalee Jefferson, PA-C 54/65/03 [id number]  David Glick, MD 68/12/75 1700

## 2014-08-12 DIAGNOSIS — M5400 Panniculitis affecting regions of neck and back, site unspecified: Secondary | ICD-10-CM | POA: Diagnosis not present

## 2014-08-12 DIAGNOSIS — M4316 Spondylolisthesis, lumbar region: Secondary | ICD-10-CM | POA: Diagnosis not present

## 2014-08-12 DIAGNOSIS — F209 Schizophrenia, unspecified: Secondary | ICD-10-CM | POA: Diagnosis not present

## 2014-08-12 DIAGNOSIS — S0181XA Laceration without foreign body of other part of head, initial encounter: Secondary | ICD-10-CM | POA: Diagnosis not present

## 2014-08-12 DIAGNOSIS — M47816 Spondylosis without myelopathy or radiculopathy, lumbar region: Secondary | ICD-10-CM | POA: Diagnosis not present

## 2014-08-26 ENCOUNTER — Encounter: Payer: Self-pay | Admitting: Cardiology

## 2014-08-26 ENCOUNTER — Ambulatory Visit (INDEPENDENT_AMBULATORY_CARE_PROVIDER_SITE_OTHER): Payer: Medicare Other | Admitting: Cardiology

## 2014-08-26 VITALS — BP 148/82 | HR 56 | Ht 73.0 in | Wt 216.0 lb

## 2014-08-26 DIAGNOSIS — E782 Mixed hyperlipidemia: Secondary | ICD-10-CM

## 2014-08-26 DIAGNOSIS — I2510 Atherosclerotic heart disease of native coronary artery without angina pectoris: Secondary | ICD-10-CM | POA: Diagnosis not present

## 2014-08-26 DIAGNOSIS — I10 Essential (primary) hypertension: Secondary | ICD-10-CM | POA: Diagnosis not present

## 2014-08-26 NOTE — Assessment & Plan Note (Signed)
Due for follow-up lab work with Dr. Luan Pulling. He continues on Pravachol on omega-3 supplements.

## 2014-08-26 NOTE — Assessment & Plan Note (Signed)
Symptomatically stable on medical therapy status post CABG in March 2015. No changes made to current regimen. Follow-up arranged in 6 months , possibly go to annual visit after that.

## 2014-08-26 NOTE — Progress Notes (Signed)
Reason for visit: CAD,  hyperlipidemia  Clinical Summary Brandon [name] is a 69 y.o.male last seen in July 2015.  He presents for a routine visit. Reports no angina symptoms, stable NYHA class II dyspnea. ECG from December 2015 showed sinus rhythm with old inferior infarct pattern and nonspecific ST changes.  We reviewed his medications.  He continues on aspirin, Lopressor , Pravachol , omega-3 supplements , HCTZ, and potassium supplements. He states that he will be following up with Dr. Roxy Manns in February.  He is due for follow-up lipid panel.  He will be seeing Dr. Luan Pulling soon for a physical with lab work.  He continues to work full time, physical labor with logging.  No Known Allergies  Current Outpatient Prescriptions  Medication Sig Dispense Refill  . acetaminophen (TYLENOL) 500 MG tablet Take 1,000 mg by mouth every 6 (six) hours as needed for mild pain. For pain    . allopurinol (ZYLOPRIM) 300 MG tablet Take 300 mg by mouth daily.    Marland Kitchen aspirin EC 325 MG EC tablet Take 1 tablet (325 mg total) by mouth daily. 30 tablet 0  . docusate sodium (COLACE) 100 MG capsule Take 1 capsule (100 mg total) by mouth 2 (two) times daily. 10 capsule 0  . Glucosamine-Chondroitin (OSTEO BI-FLEX REGULAR STRENGTH PO) Take 1 tablet by mouth daily.    . hydrochlorothiazide (HYDRODIURIL) 50 MG tablet Take 50 mg by mouth daily.    . meloxicam (MOBIC) 7.5 MG tablet Take 7.5 mg by mouth every evening.    . metoprolol tartrate (LOPRESSOR) 25 MG tablet Take 25 mg by mouth 2 (two) times daily.    . Multiple Vitamin (MULITIVITAMIN WITH MINERALS) TABS Take 1 tablet by mouth daily.    . Omega-3 Fatty Acids (FISH OIL) 1200 MG CAPS Take 1 capsule by mouth daily.    Marland Kitchen omeprazole (PRILOSEC) 20 MG capsule Take 20 mg by mouth daily.    Marland Kitchen oxyCODONE-acetaminophen (ROXICET) 5-325 MG per tablet Take 1 tablet by mouth every 4 (four) hours as needed for severe pain. 40 tablet 0  . potassium chloride (K-DUR,KLOR-CON) 10 MEQ tablet  Take 10 mEq by mouth daily.    . pravastatin (PRAVACHOL) 40 MG tablet Take 40 mg by mouth every evening.     . triamcinolone cream (KENALOG) 0.1 % Apply 1 application topically daily as needed (rash).      No current facility-administered medications for this visit.    Past Medical History  Diagnosis Date  . DDD (degenerative disc disease), lumbar     Dr. Carloyn Manner  . Mixed hyperlipidemia   . Lumbago   . History of gout   . GERD (gastroesophageal reflux disease)   . Essential hypertension, benign   . Coronary atherosclerosis of native coronary artery     Multivessel status post CABG March 2015  . History of [facility] spotted fever   . Hemorrhoids   . Cataract   . Hard of hearing   . Diabetes mellitus type 2, diet-controlled   . Renal calculus or stone 05/22/14    Past Surgical History  Procedure Laterality Date  . Total knee arthroplasty Left   . Bicept tenodesis  11/26/2011    Procedure: BICEPT TENODESIS;  Surgeon: Augustin Schooling, MD;  Location: [place];  Service: Orthopedics;  Laterality: Left;  . Left shoulder rotator cuff repair    . Lithotripsy    . Cataract surgery Left   . Vasectomy    . Colonoscopy    .  Coronary artery bypass graft N/A 10/19/2013    Procedure: CORONARY ARTERY BYPASS GRAFTING (CABG);  Surgeon: Rexene Alberts, MD;  Location: [place];  Service: Open Heart Surgery;  Laterality: N/A;  CABG times four using left internal mammary artery and left leg saphenous vein, incision made on right leg but no vein removed  . Intraoperative transesophageal echocardiogram N/A 10/19/2013    Procedure: INTRAOPERATIVE TRANSESOPHAGEAL ECHOCARDIOGRAM;  Surgeon: Rexene Alberts, MD;  Location: [place];  Service: Open Heart Surgery;  Laterality: N/A;  . Amputation Right 03/27/2014    Procedure: DEBRIDEMENT AND CLOSURE RIGHT INDEX FINGER;  Surgeon: [name], MD;  Location: [place];  Service: Orthopedics;  Laterality: Right;  . Left heart catheterization with coronary angiogram N/A  10/09/2013    Procedure: LEFT HEART CATHETERIZATION WITH CORONARY ANGIOGRAM;  Surgeon: Burnell Blanks, MD;  Location: [facility] CATH LAB;  Service: Cardiovascular;  Laterality: N/A;  . Joint replacement      Social History Brandon [name] reports that he has quit smoking. His smoking use included Cigarettes. He has a 60 pack-year smoking history. He has quit using smokeless tobacco. His smokeless tobacco use included Chew. Brandon [name] reports that he drinks alcohol.  Review of Systems Complete review of systems negative except as otherwise outlined in the clinical summary and also the following.  No palpitations or dizziness. States that he slipped walking on steps in socks around the holiday and had a laceration on his forehead that required sutures.  Physical Examination Filed Vitals:   08/26/14 0819  BP: 148/82  Pulse: 56   Filed Weights   08/26/14 0819  Weight: 216 lb (97.977 kg)    Patient appears comfortable at rest.  HEENT: Conjunctiva and lids normal, healing left forehead laceration, oropharynx clear.  Neck: Supple, no elevated JVP or carotid bruits, no thyromegaly.  Lungs: Clear to auscultation, nonlabored breathing at rest.  Thorax: Well-healing sternal incision.  Cardiac: Regular rate and rhythm, no S3 or significant systolic murmur, no pericardial rub.  Abdomen: Soft, nontender, bowel sounds present.  Extremities: No pitting edema, distal pulses 2+.  Skin: Warm and dry.  Musculoskeletal: No kyphosis.  Neuropsychiatric: Alert and oriented x3, affect grossly appropriate.   Problem List and Plan   Coronary atherosclerosis of native coronary artery  Symptomatically stable on medical therapy status post CABG in March 2015. No changes made to current regimen. Follow-up arranged in 6 months , possibly go to annual visit after that.  Mixed hyperlipidemia Due for follow-up lab work with Dr. Luan Pulling. He continues on Pravachol on omega-3 supplements.  Essential  hypertension, benign Blood pressure mildly elevated today. Continue medical therapy and keep follow-up with Dr. Luan Pulling.    Satira Sark, M.D., F.A.C.C.

## 2014-08-26 NOTE — Assessment & Plan Note (Signed)
Blood pressure mildly elevated today. Continue medical therapy and keep follow-up with Dr. Luan Pulling.

## 2014-08-26 NOTE — Patient Instructions (Signed)
Your physician wants you to follow-up in: 6 months with Dr.McDowell You will receive a reminder letter in the mail two months in advance. If you don't receive a letter, please call our office to schedule the follow-up appointment.     Your physician recommends that you continue on your current medications as directed. Please refer to the Current Medication list given to you today.     Thank you for choosing [facility] !        

## 2014-09-26 DIAGNOSIS — L570 Actinic keratosis: Secondary | ICD-10-CM | POA: Diagnosis not present

## 2014-09-26 DIAGNOSIS — D485 Neoplasm of uncertain behavior of skin: Secondary | ICD-10-CM | POA: Diagnosis not present

## 2014-09-26 DIAGNOSIS — L821 Other seborrheic keratosis: Secondary | ICD-10-CM | POA: Diagnosis not present

## 2014-09-27 DIAGNOSIS — M25351 Other instability, right hip: Secondary | ICD-10-CM | POA: Diagnosis not present

## 2014-09-27 DIAGNOSIS — M1711 Unilateral primary osteoarthritis, right knee: Secondary | ICD-10-CM | POA: Diagnosis not present

## 2014-09-27 DIAGNOSIS — M23321 Other meniscus derangements, posterior horn of medial meniscus, right knee: Secondary | ICD-10-CM | POA: Diagnosis not present

## 2014-09-27 DIAGNOSIS — M25521 Pain in right elbow: Secondary | ICD-10-CM | POA: Diagnosis not present

## 2014-09-30 ENCOUNTER — Encounter: Payer: Self-pay | Admitting: Thoracic Surgery (Cardiothoracic Vascular Surgery)

## 2014-09-30 ENCOUNTER — Ambulatory Visit: Payer: Medicare Other | Admitting: Thoracic Surgery (Cardiothoracic Vascular Surgery)

## 2014-09-30 ENCOUNTER — Ambulatory Visit (INDEPENDENT_AMBULATORY_CARE_PROVIDER_SITE_OTHER): Payer: Medicare Other | Admitting: Thoracic Surgery (Cardiothoracic Vascular Surgery)

## 2014-09-30 VITALS — BP 149/85 | HR 60 | Resp 20 | Ht 73.0 in | Wt 216.0 lb

## 2014-09-30 DIAGNOSIS — I2510 Atherosclerotic heart disease of native coronary artery without angina pectoris: Secondary | ICD-10-CM

## 2014-09-30 DIAGNOSIS — Z951 Presence of aortocoronary bypass graft: Secondary | ICD-10-CM | POA: Diagnosis not present

## 2014-09-30 NOTE — Progress Notes (Signed)
[address]       [place] [address]             [phone]     CARDIOTHORACIC SURGERY OFFICE NOTE  Referring Provider is Satira Sark, MD PCP is Alonza Bogus, MD   HPI:  Patient returns for routine followup nearly 1 year status post coronary artery bypass grafting x4 on 10/19/2013.  He was last seen here in our office on 11/12/2013 at which time he was recovering uneventfully.  Since then he has been followed carefully by Dr. Domenic Polite who saw him most recently on 08/26/2014. The patient has continued to do very well from a cardiac standpoint.  He is back at work and back to normal physical activity. He is limited primarily from chronic pain in his knee related to degenerative arthritis. He has not experienced any type of exertional chest pain or chest tightness. He reports that he only gets short of breath with relatively strenuous activity.    Current Outpatient Prescriptions  Medication Sig Dispense Refill  . acetaminophen (TYLENOL) 500 MG tablet Take 1,000 mg by mouth every 6 (six) hours as needed for mild pain. For pain    . allopurinol (ZYLOPRIM) 300 MG tablet Take 300 mg by mouth daily.    Marland Kitchen aspirin EC 325 MG EC tablet Take 1 tablet (325 mg total) by mouth daily. 30 tablet 0  . Glucosamine-Chondroitin (OSTEO BI-FLEX REGULAR STRENGTH PO) Take 1 tablet by mouth daily.    . hydrochlorothiazide (HYDRODIURIL) 50 MG tablet Take 50 mg by mouth daily.    . meloxicam (MOBIC) 7.5 MG tablet Take 7.5 mg by mouth every evening.    . metoprolol tartrate (LOPRESSOR) 25 MG tablet Take 25 mg by mouth 2 (two) times daily.    . Multiple Vitamin (MULITIVITAMIN WITH MINERALS) TABS Take 1 tablet by mouth daily.    . Omega-3 Fatty Acids (FISH OIL) 1200 MG CAPS Take 1 capsule by mouth daily.    Marland Kitchen omeprazole (PRILOSEC) 20 MG capsule Take 20 mg by mouth daily.    . potassium chloride (K-DUR,KLOR-CON) 10 MEQ tablet Take 10 mEq by mouth daily.    . pravastatin  (PRAVACHOL) 40 MG tablet Take 40 mg by mouth every evening.     . triamcinolone cream (KENALOG) 0.1 % Apply 1 application topically daily as needed (rash).      No current facility-administered medications for this visit.      Physical Exam:   BP 149/85 mmHg  Pulse 60  Resp 20  Ht 6\' 1"  (1.854 m)  Wt 216 lb (97.977 kg)  BMI 28.50 kg/m2  SpO2 98%  General:  Well-appearing  Chest:   Clear  CV:   Regular rate and rhythm  Incisions:  Completely healed, sternum is stable  Abdomen:  Soft and nontender  Extremities:  Warm and well-perfused  Diagnostic Tests:  n/a   Impression:  Patient is doing very well approximately one year status post coronary artery bypass grafting 3. He is back to normal physical activity without any significant symptoms of exertional chest pain or chest tightness to suggest the presence of angina pectoris. He describes very mild symptoms of exertional shortness of breath which only occur with strenuous activity. His exercise tolerance is fairly good and he feels much better than he did prior to surgery. His primary limitation at this point stems from chronic pain in his knee related to degenerative arthritis.  Plan:  In the future the patient  will call and return to see [place] as needed. He will continue to follow-up with Dr. Luan Pulling and Dr. Domenic Polite for long-term surveillance. The importance of risk factor modification has been emphasized.  I spent in excess of 10 minutes during the conduct of this office consultation and >50% of this time involved direct face-to-face encounter with the patient for counseling and/or coordination of their care.   Valentina Gu. Roxy Manns, MD 09/30/2014 4:08 PM

## 2014-10-07 ENCOUNTER — Encounter (INDEPENDENT_AMBULATORY_CARE_PROVIDER_SITE_OTHER): Payer: Self-pay | Admitting: *Deleted

## 2014-10-17 ENCOUNTER — Other Ambulatory Visit (INDEPENDENT_AMBULATORY_CARE_PROVIDER_SITE_OTHER): Payer: Self-pay | Admitting: *Deleted

## 2014-10-17 DIAGNOSIS — Z1211 Encounter for screening for malignant neoplasm of colon: Secondary | ICD-10-CM

## 2014-10-18 DIAGNOSIS — M1990 Unspecified osteoarthritis, unspecified site: Secondary | ICD-10-CM | POA: Diagnosis not present

## 2014-10-18 DIAGNOSIS — I259 Chronic ischemic heart disease, unspecified: Secondary | ICD-10-CM | POA: Diagnosis not present

## 2014-10-18 DIAGNOSIS — E785 Hyperlipidemia, unspecified: Secondary | ICD-10-CM | POA: Diagnosis not present

## 2014-10-18 DIAGNOSIS — I10 Essential (primary) hypertension: Secondary | ICD-10-CM | POA: Diagnosis not present

## 2014-10-24 DIAGNOSIS — I119 Hypertensive heart disease without heart failure: Secondary | ICD-10-CM | POA: Diagnosis not present

## 2014-10-24 DIAGNOSIS — J019 Acute sinusitis, unspecified: Secondary | ICD-10-CM | POA: Diagnosis not present

## 2014-10-24 DIAGNOSIS — I10 Essential (primary) hypertension: Secondary | ICD-10-CM | POA: Diagnosis not present

## 2014-10-24 DIAGNOSIS — M1990 Unspecified osteoarthritis, unspecified site: Secondary | ICD-10-CM | POA: Diagnosis not present

## 2014-10-30 DIAGNOSIS — M1711 Unilateral primary osteoarthritis, right knee: Secondary | ICD-10-CM | POA: Diagnosis not present

## 2014-10-30 DIAGNOSIS — M7710 Lateral epicondylitis, unspecified elbow: Secondary | ICD-10-CM | POA: Diagnosis not present

## 2014-11-06 DIAGNOSIS — M1711 Unilateral primary osteoarthritis, right knee: Secondary | ICD-10-CM | POA: Diagnosis not present

## 2014-11-14 DIAGNOSIS — M1711 Unilateral primary osteoarthritis, right knee: Secondary | ICD-10-CM | POA: Diagnosis not present

## 2014-11-15 ENCOUNTER — Telehealth (INDEPENDENT_AMBULATORY_CARE_PROVIDER_SITE_OTHER): Payer: Self-pay | Admitting: *Deleted

## 2014-11-15 DIAGNOSIS — Z1211 Encounter for screening for malignant neoplasm of colon: Secondary | ICD-10-CM

## 2014-11-15 MED ORDER — PEG 3350-KCL-NA BICARB-NACL 420 G PO SOLR: 4000.0000 mL | Freq: Once | ORAL | Status: DC

## 2014-11-15 NOTE — Telephone Encounter (Signed)
Patient needs trilyte 

## 2014-11-25 ENCOUNTER — Telehealth (INDEPENDENT_AMBULATORY_CARE_PROVIDER_SITE_OTHER): Payer: Self-pay | Admitting: *Deleted

## 2014-11-25 NOTE — Telephone Encounter (Signed)
Referring MD/PCP: hawkins   Procedure: tcs  Reason/Indication:  screening  Has patient had this procedure before?  Yes, 2006 -- epic  If so, when, by whom and where?    Is there a family history of colon cancer?  no  Who?  What age when diagnosed?    Is patient diabetic?   no      Does patient have prosthetic heart valve?  no  Do you have a pacemaker?  no  Has patient ever had endocarditis? no  Has patient had joint replacement within last 12 months?  no  Does patient tend to be constipated or take laxatives? no  Is patient on Coumadin, Plavix and/or Aspirin? yes  Medications: asa 325 mg daily, hctz 50 mg daily, metoprolol 25 mg bid, pravastatin 40 mg daily, allopurinol 300 mg daily, klor-con daily, omeprazole 20 mg daily, meloxicam 7.5 mg daily, multi vit,  Tylenol 50 mg bid, osteo bio flex, vit c, fish oil  Allergies: nkda  Medication Adjustment: asa 2 days  Procedure date & time: 12/26/14 at 730

## 2014-11-26 NOTE — Telephone Encounter (Signed)
agree

## 2014-12-26 ENCOUNTER — Encounter (HOSPITAL_COMMUNITY): Payer: Self-pay | Admitting: *Deleted

## 2014-12-26 ENCOUNTER — Ambulatory Visit (HOSPITAL_COMMUNITY)
Admission: RE | Admit: 2014-12-26 | Discharge: 2014-12-26 | Disposition: A | Discharge status: Home / Self Care | Payer: Medicare Other | Source: Ambulatory Visit | Attending: Internal Medicine | Admitting: Internal Medicine

## 2014-12-26 ENCOUNTER — Encounter (HOSPITAL_COMMUNITY)
Admission: RE | Disposition: A | Discharge status: Home / Self Care | Payer: Self-pay | Source: Ambulatory Visit | Attending: Internal Medicine

## 2014-12-26 DIAGNOSIS — Z1211 Encounter for screening for malignant neoplasm of colon: Secondary | ICD-10-CM | POA: Diagnosis not present

## 2014-12-26 DIAGNOSIS — D125 Benign neoplasm of sigmoid colon: Secondary | ICD-10-CM | POA: Insufficient documentation

## 2014-12-26 DIAGNOSIS — Z951 Presence of aortocoronary bypass graft: Secondary | ICD-10-CM | POA: Diagnosis not present

## 2014-12-26 DIAGNOSIS — Z791 Long term (current) use of non-steroidal anti-inflammatories (NSAID): Secondary | ICD-10-CM | POA: Diagnosis not present

## 2014-12-26 DIAGNOSIS — M109 Gout, unspecified: Secondary | ICD-10-CM | POA: Insufficient documentation

## 2014-12-26 DIAGNOSIS — Z96652 Presence of left artificial knee joint: Secondary | ICD-10-CM | POA: Diagnosis not present

## 2014-12-26 DIAGNOSIS — Z79899 Other long term (current) drug therapy: Secondary | ICD-10-CM | POA: Insufficient documentation

## 2014-12-26 DIAGNOSIS — Z7952 Long term (current) use of systemic steroids: Secondary | ICD-10-CM | POA: Insufficient documentation

## 2014-12-26 DIAGNOSIS — K5730 Diverticulosis of large intestine without perforation or abscess without bleeding: Secondary | ICD-10-CM | POA: Insufficient documentation

## 2014-12-26 DIAGNOSIS — K219 Gastro-esophageal reflux disease without esophagitis: Secondary | ICD-10-CM | POA: Diagnosis not present

## 2014-12-26 DIAGNOSIS — K648 Other hemorrhoids: Secondary | ICD-10-CM | POA: Diagnosis not present

## 2014-12-26 DIAGNOSIS — I10 Essential (primary) hypertension: Secondary | ICD-10-CM | POA: Diagnosis not present

## 2014-12-26 DIAGNOSIS — Z7982 Long term (current) use of aspirin: Secondary | ICD-10-CM | POA: Diagnosis not present

## 2014-12-26 DIAGNOSIS — E782 Mixed hyperlipidemia: Secondary | ICD-10-CM | POA: Diagnosis not present

## 2014-12-26 DIAGNOSIS — D127 Benign neoplasm of rectosigmoid junction: Secondary | ICD-10-CM | POA: Diagnosis not present

## 2014-12-26 DIAGNOSIS — I2510 Atherosclerotic heart disease of native coronary artery without angina pectoris: Secondary | ICD-10-CM | POA: Insufficient documentation

## 2014-12-26 DIAGNOSIS — Z87891 Personal history of nicotine dependence: Secondary | ICD-10-CM | POA: Diagnosis not present

## 2014-12-26 DIAGNOSIS — L648 Other androgenic alopecia: Secondary | ICD-10-CM | POA: Diagnosis not present

## 2014-12-26 HISTORY — PX: COLONOSCOPY: SHX5424

## 2014-12-26 SURGERY — COLONOSCOPY
Anesthesia: Moderate Sedation

## 2014-12-26 MED ORDER — MIDAZOLAM HCL 5 MG/5ML IJ SOLN
INTRAMUSCULAR | Status: AC
  Filled 2014-12-26: qty 10

## 2014-12-26 MED ORDER — MEPERIDINE HCL 50 MG/ML IJ SOLN
INTRAMUSCULAR | Status: AC
  Filled 2014-12-26: qty 1

## 2014-12-26 MED ORDER — SODIUM CHLORIDE 0.9 % IV SOLN
INTRAVENOUS | Status: DC
  Administered 2014-12-26: 1000 mL via INTRAVENOUS

## 2014-12-26 MED ORDER — STERILE WATER FOR IRRIGATION IR SOLN
Status: DC | PRN
  Administered 2014-12-26: 07:00:00

## 2014-12-26 MED ORDER — MEPERIDINE HCL 50 MG/ML IJ SOLN
INTRAMUSCULAR | Status: DC | PRN
  Administered 2014-12-26 (×2): 25 mg via INTRAVENOUS

## 2014-12-26 MED ORDER — MIDAZOLAM HCL 5 MG/5ML IJ SOLN
INTRAMUSCULAR | Status: DC | PRN
  Administered 2014-12-26: 1 mg via INTRAVENOUS
  Administered 2014-12-26 (×2): 2 mg via INTRAVENOUS

## 2014-12-26 NOTE — Discharge Instructions (Signed)
Resume aspirin on 12/30/2014 and meloxicam in one week. Resume other medications as before. Resume usual diet. No driving for 24 hours. Physician will call with biopsy results.

## 2014-12-26 NOTE — H&P (Signed)
Brandon [name] is an 69 y.o. male.   Chief Complaint: Patient is here for colonoscopy. HPI: Patient is 69 year old Caucasian male who is here for screening colonoscopy. He denies abdominal pain change in bowel habits or rectal bleeding. Last colonoscopy was 10 years ago. Family history is negative for CRC. He had coronary artery bypass graft in March last year and has done well.  Past Medical History  Diagnosis Date  . DDD (degenerative disc disease), lumbar     Dr. Carloyn Manner  . Mixed hyperlipidemia   . Lumbago   . History of gout   . GERD (gastroesophageal reflux disease)   . Essential hypertension, benign   . Coronary atherosclerosis of native coronary artery     Multivessel status post CABG March 2015  . History of [facility] spotted fever   . Hemorrhoids   . Cataract   . Hard of hearing   . Renal calculus or stone 05/22/14    Past Surgical History  Procedure Laterality Date  . Total knee arthroplasty Left   . Bicept tenodesis  11/26/2011    Procedure: BICEPT TENODESIS;  Surgeon: Augustin Schooling, MD;  Location: [facility];  Service: Orthopedics;  Laterality: Left;  . Left shoulder rotator cuff repair    . Lithotripsy    . Cataract surgery Left   . Vasectomy    . Colonoscopy    . Coronary artery bypass graft N/A 10/19/2013    Procedure: CORONARY ARTERY BYPASS GRAFTING (CABG);  Surgeon: Rexene Alberts, MD;  Location: [place];  Service: Open Heart Surgery;  Laterality: N/A;  CABG times four using left internal mammary artery and left leg saphenous vein, incision made on right leg but no vein removed  . Intraoperative transesophageal echocardiogram N/A 10/19/2013    Procedure: INTRAOPERATIVE TRANSESOPHAGEAL ECHOCARDIOGRAM;  Surgeon: Rexene Alberts, MD;  Location: [place];  Service: Open Heart Surgery;  Laterality: N/A;  . Amputation Right 03/27/2014    Procedure: DEBRIDEMENT AND CLOSURE RIGHT INDEX FINGER;  Surgeon: [name], MD;  Location: [facility];  Service: Orthopedics;  Laterality:  Right;  . Left heart catheterization with coronary angiogram N/A 10/09/2013    Procedure: LEFT HEART CATHETERIZATION WITH CORONARY ANGIOGRAM;  Surgeon: Burnell Blanks, MD;  Location: [facility] CATH LAB;  Service: Cardiovascular;  Laterality: N/A;  . Joint replacement      Family History  Problem Relation Age of Onset  . Lung cancer Father   . Heart failure Mother   . Hypertension Sister    Social History:  reports that he has quit smoking. His smoking use included Cigarettes. He has a 60 pack-year smoking history. He has quit using smokeless tobacco. His smokeless tobacco use included Chew. He reports that he drinks alcohol. He reports that he does not use illicit drugs.  Allergies: No Known Allergies  Medications Prior to Admission  Medication Sig Dispense Refill  . acetaminophen (TYLENOL) 500 MG tablet Take 1,000 mg by mouth daily. For pain    . allopurinol (ZYLOPRIM) 300 MG tablet Take 300 mg by mouth daily.    . Ascorbic Acid (VITAMIN C PO) Take 1 tablet by mouth daily.    Marland Kitchen aspirin EC 325 MG EC tablet Take 1 tablet (325 mg total) by mouth daily. 30 tablet 0  . Glucosamine-Chondroitin (OSTEO BI-FLEX REGULAR STRENGTH PO) Take 1 tablet by mouth daily.    Marland Kitchen guaiFENesin (MUCINEX) 600 MG 12 hr tablet Take 600 mg by mouth daily as needed for cough or to loosen  phlegm.    . hydrochlorothiazide (HYDRODIURIL) 50 MG tablet Take 50 mg by mouth daily.    Marland Kitchen loratadine (CLARITIN) 10 MG tablet Take 10 mg by mouth daily as needed for allergies.    . meloxicam (MOBIC) 7.5 MG tablet Take 7.5 mg by mouth every evening.    . metoprolol tartrate (LOPRESSOR) 25 MG tablet Take 25 mg by mouth 2 (two) times daily.    . Multiple Vitamin (MULITIVITAMIN WITH MINERALS) TABS Take 1 tablet by mouth daily.    . Omega-3 Fatty Acids (FISH OIL) 1200 MG CAPS Take 1 capsule by mouth daily.    Marland Kitchen omeprazole (PRILOSEC) 20 MG capsule Take 20 mg by mouth daily.    . polyethylene glycol-electrolytes (NULYTELY/GOLYTELY) 420  G solution Take 4,000 mLs by mouth once. 4000 mL 0  . potassium chloride (K-DUR,KLOR-CON) 10 MEQ tablet Take 10 mEq by mouth daily.    . pravastatin (PRAVACHOL) 40 MG tablet Take 40 mg by mouth every evening.     . triamcinolone cream (KENALOG) 0.1 % Apply 1 application topically daily as needed (rash).       No results found for this or any previous visit (from the past 48 hour(s)). No results found.  ROS  Blood pressure 155/91, pulse 56, temperature 97.5 F (36.4 C), temperature source Oral, resp. rate 17, height 6\' 1"  (1.854 m), weight 210 lb (95.255 kg), SpO2 95 %. Physical Exam  Constitutional: He appears well-developed and well-nourished.  HENT:  Mouth/Throat: Oropharynx is clear and moist.  Eyes: Conjunctivae are normal. No scleral icterus.  Neck: No thyromegaly present.  Cardiovascular: Normal rate, regular rhythm and normal heart sounds.   No murmur heard. Respiratory: Effort normal and breath sounds normal.  GI: Soft. He exhibits no distension and no mass. There is no tenderness.  Musculoskeletal: He exhibits no edema.  Lymphadenopathy:    He has no cervical adenopathy.  Neurological: He is alert.  Skin: Skin is warm.     Assessment/Plan Average risk screening colonoscopy.  Geneveive Furness U 12/26/2014, 7:31 AM

## 2014-12-26 NOTE — Op Note (Signed)
COLONOSCOPY PROCEDURE REPORT  PATIENT:  Brandon [name]  MR#:  [id number] Birthdate:  [birth year], 69 y.o., male Endoscopist:  Dr. Rogene Houston, MD Referred By:  Dr. Alonza Bogus, MD Procedure Date: 12/26/2014  Procedure:   Colonoscopy  Indications:  Patient is 69 year old Caucasian male who is undergoing average risk screening colonoscopy. His last exam was 10 years ago.  Informed Consent:  The procedure and risks were reviewed with the patient and informed consent was obtained.  Medications:  Demerol 50 mg IV Versed 5 mg IV  Description of procedure:  After a digital rectal exam was performed, that colonoscope was advanced from the anus through the rectum and colon to the area of the cecum, ileocecal valve and appendiceal orifice. The cecum was deeply intubated. These structures were well-seen and photographed for the record. From the level of the cecum and ileocecal valve, the scope was slowly and cautiously withdrawn. The mucosal surfaces were carefully surveyed utilizing scope tip to flexion to facilitate fold flattening as needed. The scope was pulled down into the rectum where a thorough exam including retroflexion was performed.  Findings:   Prep excellent. Two small diverticula noted at hepatic flexure. 5 mm polyp cold snared from proximal sigmoid colon. 7 mm polyp cold snared from rectosigmoid junction. Residual polyp ablated with snare tip. Both polyps were submitted together. Normal rectal mucosa. Small hemorrhoids above the dentate line.   Therapeutic/Diagnostic Maneuvers Performed:  See above  Complications:  None  Cecal Withdrawal Time:  22 minutes  Impression:  Examination performed to cecum. 2 diverticula at hepatic flexure. 5 mm polyp cold snared from proximal sigmoid colon. 7 mm polyp cold snared from rectosigmoid junction. Residual polyp quadrant with snare tip. Both polyps were submitted together. Small internal hemorrhoids.  Recommendations:   Standard instructions given. Patient will resume aspirin on 12/30/2014. I will contact patient with biopsy results and further recommendations.  Crew Goren U  12/26/2014 8:20 AM  CC: Dr. Alonza Bogus, MD & Dr. Rayne Du ref. provider found

## 2014-12-27 ENCOUNTER — Encounter (HOSPITAL_COMMUNITY): Payer: Self-pay | Admitting: Internal Medicine

## 2014-12-31 DIAGNOSIS — G5602 Carpal tunnel syndrome, left upper limb: Secondary | ICD-10-CM | POA: Diagnosis not present

## 2014-12-31 DIAGNOSIS — M1711 Unilateral primary osteoarthritis, right knee: Secondary | ICD-10-CM | POA: Diagnosis not present

## 2015-01-01 ENCOUNTER — Telehealth (INDEPENDENT_AMBULATORY_CARE_PROVIDER_SITE_OTHER): Payer: Self-pay | Admitting: *Deleted

## 2015-01-01 NOTE — Telephone Encounter (Signed)
[name], patient's wife, said Dr. Laural Golden told him not to start back on Meloxicam 7.5 mg and Asprin until Thursday, 01/02/15. He takes the Meloxicamf or his back and has been in a lot of pain. She has not noticed any bleeding and would like to know if he could start back on it tonight? The return phone number is [phone].

## 2015-01-01 NOTE — Telephone Encounter (Signed)
Start the Meloxicam tomorrow. I do not want him to bleed. Start ASA tomorrow. May take Tylenol for pain

## 2015-01-06 ENCOUNTER — Encounter (INDEPENDENT_AMBULATORY_CARE_PROVIDER_SITE_OTHER): Payer: Self-pay | Admitting: *Deleted

## 2015-01-28 DIAGNOSIS — M4316 Spondylolisthesis, lumbar region: Secondary | ICD-10-CM | POA: Diagnosis not present

## 2015-01-28 DIAGNOSIS — M47816 Spondylosis without myelopathy or radiculopathy, lumbar region: Secondary | ICD-10-CM | POA: Diagnosis not present

## 2015-02-06 DIAGNOSIS — G5602 Carpal tunnel syndrome, left upper limb: Secondary | ICD-10-CM | POA: Diagnosis not present

## 2015-02-14 DIAGNOSIS — H2511 Age-related nuclear cataract, right eye: Secondary | ICD-10-CM | POA: Diagnosis not present

## 2015-02-14 DIAGNOSIS — Z961 Presence of intraocular lens: Secondary | ICD-10-CM | POA: Diagnosis not present

## 2015-02-14 DIAGNOSIS — H10413 Chronic giant papillary conjunctivitis, bilateral: Secondary | ICD-10-CM | POA: Diagnosis not present

## 2015-02-15 DIAGNOSIS — G5602 Carpal tunnel syndrome, left upper limb: Secondary | ICD-10-CM | POA: Diagnosis not present

## 2015-02-27 ENCOUNTER — Encounter: Payer: Self-pay | Admitting: Cardiology

## 2015-02-27 ENCOUNTER — Ambulatory Visit (INDEPENDENT_AMBULATORY_CARE_PROVIDER_SITE_OTHER): Payer: Medicare Other | Admitting: Cardiology

## 2015-02-27 VITALS — BP 130/74 | HR 63 | Ht 73.0 in | Wt 213.0 lb

## 2015-02-27 DIAGNOSIS — I10 Essential (primary) hypertension: Secondary | ICD-10-CM

## 2015-02-27 DIAGNOSIS — I2510 Atherosclerotic heart disease of native coronary artery without angina pectoris: Secondary | ICD-10-CM | POA: Diagnosis not present

## 2015-02-27 DIAGNOSIS — E782 Mixed hyperlipidemia: Secondary | ICD-10-CM | POA: Diagnosis not present

## 2015-02-27 DIAGNOSIS — Z01810 Encounter for preprocedural cardiovascular examination: Secondary | ICD-10-CM | POA: Diagnosis not present

## 2015-02-27 NOTE — Progress Notes (Signed)
Cardiology Office Note  Date: 02/27/2015   ID: Brandon [name], DOB 04-07-1946, MRN [id number]  PCP: Alonza Bogus, MD  Primary Cardiologist: Rozann Lesches, MD   Chief Complaint  Patient presents with  . Coronary Artery Disease  . Hyperlipidemia    History of Present Illness: Brandon [name] is a 69 y.o. male last seen in January. Interval follow-up with Dr. Roxy Manns noted. He is here today with his wife for a routine follow-up visit. Doing well from a cardiac perspective, no angina or limiting shortness of breath. 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  We reviewed his medications which are outlined below. He reports no problems or side effects. He is due for a follow-up lipid panel.  He states that he will be undergoing carpal tunnel release in August, may also need to have back surgery with Dr. Carloyn Manner. Unless his clinical situation changes, I doubt that he will need any further cardiac testing prior to either of these procedures. He is over one year out from CABG in March 2015.   Past Medical History  Diagnosis Date  . DDD (degenerative disc disease), lumbar     Dr. Carloyn Manner  . Mixed hyperlipidemia   . Lumbago   . History of gout   . GERD (gastroesophageal reflux disease)   . Essential hypertension, benign   . Coronary atherosclerosis of native coronary artery     Multivessel status post CABG March 2015  . History of [facility] spotted fever   . Hemorrhoids   . Cataract   . Hard of hearing   . Renal calculus or stone 05/22/14    Past Surgical History  Procedure Laterality Date  . Total knee arthroplasty Left   . Bicept tenodesis  11/26/2011    Procedure: BICEPT TENODESIS;  Surgeon: Augustin Schooling, MD;  Location: [place];  Service: Orthopedics;  Laterality: Left;  . Left shoulder rotator cuff repair    . Lithotripsy    . Cataract surgery Left   . Vasectomy    . Colonoscopy    . Coronary artery bypass graft N/A 10/19/2013    Procedure: CORONARY ARTERY BYPASS GRAFTING  (CABG);  Surgeon: Rexene Alberts, MD;  Location: [place];  Service: Open Heart Surgery;  Laterality: N/A;  CABG times four using left internal mammary artery and left leg saphenous vein, incision made on right leg but no vein removed  . Intraoperative transesophageal echocardiogram N/A 10/19/2013    Procedure: INTRAOPERATIVE TRANSESOPHAGEAL ECHOCARDIOGRAM;  Surgeon: Rexene Alberts, MD;  Location: [facility];  Service: Open Heart Surgery;  Laterality: N/A;  . Amputation Right 03/27/2014    Procedure: DEBRIDEMENT AND CLOSURE RIGHT INDEX FINGER;  Surgeon: [name], MD;  Location: [place];  Service: Orthopedics;  Laterality: Right;  . Left heart catheterization with coronary angiogram N/A 10/09/2013    Procedure: LEFT HEART CATHETERIZATION WITH CORONARY ANGIOGRAM;  Surgeon: Burnell Blanks, MD;  Location: [facility] CATH LAB;  Service: Cardiovascular;  Laterality: N/A;  . Joint replacement    . Colonoscopy N/A 12/26/2014    Procedure: COLONOSCOPY;  Surgeon: Rogene Houston, MD;  [facility];  Service: Endoscopy;  Laterality: N/A;  730    Current Outpatient Prescriptions  Medication Sig Dispense Refill  . acetaminophen (TYLENOL) 500 MG tablet Take 1,000 mg by mouth daily. For pain    . allopurinol (ZYLOPRIM) 300 MG tablet Take 300 mg by mouth daily.    . Ascorbic Acid (VITAMIN C PO) Take 1 tablet  by mouth daily.    . Glucosamine-Chondroitin (OSTEO BI-FLEX REGULAR STRENGTH PO) Take 1 tablet by mouth daily.    . hydrochlorothiazide (HYDRODIURIL) 50 MG tablet Take 50 mg by mouth daily.    . metoprolol tartrate (LOPRESSOR) 25 MG tablet Take 25 mg by mouth 2 (two) times daily.    . Multiple Vitamin (MULITIVITAMIN WITH MINERALS) TABS Take 1 tablet by mouth daily.    . Omega-3 Fatty Acids (FISH OIL) 1200 MG CAPS Take 1 capsule by mouth daily.    Marland Kitchen omeprazole (PRILOSEC) 20 MG capsule Take 20 mg by mouth daily.    . potassium chloride (K-DUR,KLOR-CON) 10 MEQ tablet Take 10 mEq by mouth daily.    .  pravastatin (PRAVACHOL) 40 MG tablet Take 40 mg by mouth every evening.      No current facility-administered medications for this visit.    Allergies:  Review of patient's allergies indicates no known allergies.   Social History: The patient  reports that he quit smoking about 31 years ago. His smoking use included Cigarettes. He started smoking about 56 years ago. He has a 60 pack-year smoking history. He has quit using smokeless tobacco. His smokeless tobacco use included Chew. He reports that he drinks alcohol. He reports that he does not use illicit drugs.   ROS:  Please see the history of present illness. Otherwise, complete review of systems is positive for chronic back pain and knee pain.  All other systems are reviewed and negative.   Physical Exam: VS:  BP 130/74 mmHg  Pulse 63  Ht 6\' 1"  (1.854 m)  Wt 213 lb (96.616 kg)  BMI 28.11 kg/m2  SpO2 98%, BMI Body mass index is 28.11 kg/(m^2).  Wt Readings from Last 3 Encounters:  02/27/15 213 lb (96.616 kg)  12/26/14 210 lb (95.255 kg)  09/30/14 216 lb (97.977 kg)     Patient appears comfortable at rest.  HEENT: Conjunctiva and lids normal, healing left forehead laceration, oropharynx clear.  Neck: Supple, no elevated JVP or carotid bruits, no thyromegaly.  Lungs: Clear to auscultation, nonlabored breathing at rest.  Thorax: Well-healing sternal incision.  Cardiac: Regular rate and rhythm, no S3 or significant systolic murmur, no pericardial rub.  Abdomen: Soft, nontender, bowel sounds present.  Extremities: No pitting edema, distal pulses 2+.  Skin: Warm and dry.  Musculoskeletal: No kyphosis.  Neuropsychiatric: Alert and oriented x3, affect grossly appropriate.  ECG: ECG is not ordered today.  Recent Labwork: 03/27/2014: BUN 33*; Hemoglobin 14.2; Platelets 239; Potassium 4.0; Sodium 141 06/06/2014: Creatinine, Ser 0.80     Component Value Date/Time   CHOL 141 12/06/2013 0757   TRIG 92 12/06/2013 0757   HDL  58 12/06/2013 0757   CHOLHDL 2.4 12/06/2013 0757   VLDL 18 12/06/2013 0757   LDLCALC 65 12/06/2013 0757    Other Studies Reviewed Today:  Echocardiogram 09/19/2013: Study Conclusions  - Left ventricle: Normal left ventricular outflow tract gradient. Wall thickness was increased in a pattern of severe LVH. Systolic function was normal. The estimated ejection fraction was in the range of 60% to 65%. Wall motion was normal; there were no regional wall motion abnormalities. There was an increased relative contribution of atrial contraction to ventricular filling. Doppler parameters are consistent with abnormal left ventricular relaxation (grade 1 diastolic dysfunction). There was no evidence of elevated ventricular filling pressure by Doppler parameters. - Aortic valve: Trileaflet; mildly thickened leaflets. There was no stenosis. - Mitral valve: No systolic motion of the anterior mitral leaflet was  noted. A small torn chord was appreciated, with no evidence of mitral regurgitation. Calcified annulus. - Left atrium: The atrium was mildly dilated.  ASSESSMENT AND PLAN:  1. CAD status post CABG in March 2015. He is symptomatically stable on medical therapy, doing well at this time. No changes were made today and his medicine regimen.  2. Hyperlipidemia, on Pravachol. LDL was 65 in April 2015. Follow-up LFT and FLP.  3. Essential hypertension, blood pressure is reasonable today.  4. Preoperative evaluation. Patient states that he will be undergoing carpal tunnel release in August. He is clinically stable from a cardiac perspective, should be able to proceed with overall low risk. No further cardiac testing anticipated at this time.  Current medicines were reviewed at length with the patient today.   Orders Placed This Encounter  Procedures  . Lipid Profile  . Hepatic function panel    Disposition: FU with me in 6 months.   Signed, Satira Sark, MD, [facility] 02/27/2015 8:25 AM    [facility] at [facility] [address] [place], [place] [address] Phone: [phone]; Fax: [phone]

## 2015-02-27 NOTE — Patient Instructions (Signed)
Your physician wants you to follow-up in: 6 months with Dr. Domenic Polite. You will receive a reminder letter in the mail two months in advance. If you don't receive a letter, please call our office to schedule the follow-up appointment.  Your physician recommends that you continue on your current medications as directed. Please refer to the Current Medication list given to you today.   Your physician recommends that you return for lab work in: FASTING   Thanks for [facility]!!!

## 2015-04-01 DIAGNOSIS — M47816 Spondylosis without myelopathy or radiculopathy, lumbar region: Secondary | ICD-10-CM | POA: Diagnosis not present

## 2015-04-01 DIAGNOSIS — M4316 Spondylolisthesis, lumbar region: Secondary | ICD-10-CM | POA: Diagnosis not present

## 2015-04-04 DIAGNOSIS — G5602 Carpal tunnel syndrome, left upper limb: Secondary | ICD-10-CM | POA: Diagnosis not present

## 2015-04-04 HISTORY — PX: OTHER SURGICAL HISTORY: SHX169

## 2015-04-17 DIAGNOSIS — G5602 Carpal tunnel syndrome, left upper limb: Secondary | ICD-10-CM | POA: Diagnosis not present

## 2015-05-01 DIAGNOSIS — M19021 Primary osteoarthritis, right elbow: Secondary | ICD-10-CM | POA: Diagnosis not present

## 2015-05-01 DIAGNOSIS — Z4789 Encounter for other orthopedic aftercare: Secondary | ICD-10-CM | POA: Diagnosis not present

## 2015-06-30 DIAGNOSIS — Z23 Encounter for immunization: Secondary | ICD-10-CM | POA: Diagnosis not present

## 2015-07-14 DIAGNOSIS — Z01818 Encounter for other preprocedural examination: Secondary | ICD-10-CM | POA: Diagnosis not present

## 2015-07-17 DIAGNOSIS — M47817 Spondylosis without myelopathy or radiculopathy, lumbosacral region: Secondary | ICD-10-CM | POA: Diagnosis not present

## 2015-07-17 DIAGNOSIS — M4317 Spondylolisthesis, lumbosacral region: Secondary | ICD-10-CM | POA: Diagnosis not present

## 2015-07-17 DIAGNOSIS — I10 Essential (primary) hypertension: Secondary | ICD-10-CM | POA: Diagnosis not present

## 2015-07-17 DIAGNOSIS — E7800 Pure hypercholesterolemia, unspecified: Secondary | ICD-10-CM | POA: Diagnosis not present

## 2015-07-17 DIAGNOSIS — M4807 Spinal stenosis, lumbosacral region: Secondary | ICD-10-CM | POA: Diagnosis not present

## 2015-07-17 DIAGNOSIS — Z951 Presence of aortocoronary bypass graft: Secondary | ICD-10-CM | POA: Diagnosis not present

## 2015-07-17 DIAGNOSIS — Z7982 Long term (current) use of aspirin: Secondary | ICD-10-CM | POA: Diagnosis not present

## 2015-07-17 DIAGNOSIS — M4326 Fusion of spine, lumbar region: Secondary | ICD-10-CM | POA: Diagnosis not present

## 2015-07-17 DIAGNOSIS — Z79899 Other long term (current) drug therapy: Secondary | ICD-10-CM | POA: Diagnosis not present

## 2015-07-17 DIAGNOSIS — M4316 Spondylolisthesis, lumbar region: Secondary | ICD-10-CM | POA: Diagnosis not present

## 2015-07-17 DIAGNOSIS — M47816 Spondylosis without myelopathy or radiculopathy, lumbar region: Secondary | ICD-10-CM | POA: Diagnosis not present

## 2015-07-17 DIAGNOSIS — M4806 Spinal stenosis, lumbar region: Secondary | ICD-10-CM | POA: Diagnosis not present

## 2015-07-17 DIAGNOSIS — I2510 Atherosclerotic heart disease of native coronary artery without angina pectoris: Secondary | ICD-10-CM | POA: Diagnosis not present

## 2015-07-17 DIAGNOSIS — K5900 Constipation, unspecified: Secondary | ICD-10-CM | POA: Diagnosis not present

## 2015-07-17 DIAGNOSIS — R110 Nausea: Secondary | ICD-10-CM | POA: Diagnosis not present

## 2015-07-17 DIAGNOSIS — Z96652 Presence of left artificial knee joint: Secondary | ICD-10-CM | POA: Diagnosis present

## 2015-07-17 DIAGNOSIS — M109 Gout, unspecified: Secondary | ICD-10-CM | POA: Diagnosis not present

## 2015-07-24 DIAGNOSIS — Z981 Arthrodesis status: Secondary | ICD-10-CM | POA: Diagnosis not present

## 2015-07-24 DIAGNOSIS — M47816 Spondylosis without myelopathy or radiculopathy, lumbar region: Secondary | ICD-10-CM | POA: Diagnosis not present

## 2015-09-01 ENCOUNTER — Encounter: Payer: Self-pay | Admitting: Cardiology

## 2015-09-01 ENCOUNTER — Ambulatory Visit (INDEPENDENT_AMBULATORY_CARE_PROVIDER_SITE_OTHER): Payer: Medicare Other | Admitting: Cardiology

## 2015-09-01 VITALS — BP 142/88 | HR 70 | Ht 73.0 in | Wt 219.0 lb

## 2015-09-01 DIAGNOSIS — E782 Mixed hyperlipidemia: Secondary | ICD-10-CM

## 2015-09-01 DIAGNOSIS — I2510 Atherosclerotic heart disease of native coronary artery without angina pectoris: Secondary | ICD-10-CM

## 2015-09-01 DIAGNOSIS — I10 Essential (primary) hypertension: Secondary | ICD-10-CM

## 2015-09-01 NOTE — Patient Instructions (Signed)
Your physician wants you to follow-up in: 28 MONTHS with Dr Ferne Reus will receive a reminder letter in the mail two months in advance. If you don't receive a letter, please call our office to schedule the follow-up appointment.     Your physician recommends that you continue on your current medications as directed. Please refer to the Current Medication list given to you today..     If you need a refill on your cardiac medications before your next appointment, please call your pharmacy.     Thank you for choosing [facility] !

## 2015-09-01 NOTE — Progress Notes (Signed)
Cardiology Office Note  Date: 09/01/2015   ID: Brandon [name], DOB [birth year], MRN [id number]  PCP: Alonza Bogus, MD  Primary Cardiologist: Rozann Lesches, MD   Chief Complaint  Patient presents with  . Coronary Artery Disease    History of Present Illness: Brandon [name] is a 70 y.o. male last seen in July 2016. He is here today with his wife for a follow-up visit. He underwent lumbar spine surgery with Dr. Carloyn Manner back in December 2016 at [facility]. He has a brace in place, seems to be recuperating fairly well. He is having a hard time with the relative inactivity however. He does not endorse any angina symptoms or unusual shortness of breath.  Blood pressure was elevated today, rechecked by me and coming down. He reports good blood pressure control at home on his current regimen.  ECG today shows normal sinus rhythm with old inferior infarct pattern and nonspecific ST changes.  He states he will be having a physical with lab work with Dr. Luan Pulling in March.   Past Medical History  Diagnosis Date  . DDD (degenerative disc disease), lumbar     Dr. Carloyn Manner  . Mixed hyperlipidemia   . Lumbago   . History of gout   . GERD (gastroesophageal reflux disease)   . Essential hypertension, benign   . Coronary atherosclerosis of native coronary artery     Multivessel status post CABG March 2015  . History of [facility] spotted fever   . Hemorrhoids   . Cataract   . Hard of hearing   . Renal calculus or stone 05/22/14    Current Outpatient Prescriptions  Medication Sig Dispense Refill  . acetaminophen (TYLENOL) 500 MG tablet Take 1,000 mg by mouth daily. For pain    . allopurinol (ZYLOPRIM) 300 MG tablet Take 300 mg by mouth daily.    . Ascorbic Acid (VITAMIN C PO) Take 1 tablet by mouth daily.    . Glucosamine-Chondroitin (OSTEO BI-FLEX REGULAR STRENGTH PO) Take 1 tablet by mouth daily.    . hydrochlorothiazide (HYDRODIURIL) 50 MG tablet Take 50 mg by mouth daily.    .  metoprolol tartrate (LOPRESSOR) 25 MG tablet Take 25 mg by mouth 2 (two) times daily.    . Multiple Vitamin (MULITIVITAMIN WITH MINERALS) TABS Take 1 tablet by mouth daily.    . Omega-3 Fatty Acids (FISH OIL) 1200 MG CAPS Take 1 capsule by mouth daily.    . potassium chloride (K-DUR,KLOR-CON) 10 MEQ tablet Take 10 mEq by mouth daily.    . pravastatin (PRAVACHOL) 40 MG tablet Take 40 mg by mouth every evening.     . ranitidine (ZANTAC) 75 MG tablet Take 75 mg by mouth 2 (two) times daily.     No current facility-administered medications for this visit.   Allergies:  Review of patient's allergies indicates no known allergies.   Social History: The patient  reports that he quit smoking about 32 years ago. His smoking use included Cigarettes. He started smoking about 57 years ago. He has a 60 pack-year smoking history. He has quit using smokeless tobacco. His smokeless tobacco use included Chew. He reports that he drinks alcohol. He reports that he does not use illicit drugs.   ROS:  Please see the history of present illness. Otherwise, complete review of systems is positive for none.  All other systems are reviewed and negative.   Physical Exam: VS:  BP 142/88 mmHg  Pulse 70  Ht 6\' 1"  (1.854 m)  Wt 219 lb (99.338 kg)  BMI 28.90 kg/m2  SpO2 95%, BMI Body mass index is 28.9 kg/(m^2).  Wt Readings from Last 3 Encounters:  09/01/15 219 lb (99.338 kg)  02/27/15 213 lb (96.616 kg)  12/26/14 210 lb (95.255 kg)    Patient appears comfortable at rest.  HEENT: Conjunctiva and lids normal, oropharynx clear.  Neck: Supple, no elevated JVP or carotid bruits, no thyromegaly.  Lungs: Clear to auscultation, nonlabored breathing at rest.  Cardiac: Regular rate and rhythm, no S3 or significant systolic murmur, no pericardial rub.  Abdomen: Brace in place. Extremities: No pitting edema, distal pulses 2+.   ECG: Tracing from 08/08/2014 showed sinus rhythm with prolonged PR interval and old  inferior infarct pattern.  Recent Labwork: No results found for requested labs within last 365 days.     Component Value Date/Time   CHOL 141 12/06/2013 0757   TRIG 92 12/06/2013 0757   HDL 58 12/06/2013 0757   CHOLHDL 2.4 12/06/2013 0757   VLDL 18 12/06/2013 0757   LDLCALC 65 12/06/2013 0757    Other Studies Reviewed Today:  Echocardiogram 09/19/2013: Study Conclusions  - Left ventricle: Normal left ventricular outflow tract gradient. Wall thickness was increased in a pattern of severe LVH. Systolic function was normal. The estimated ejection fraction was in the range of 60% to 65%. Wall motion was normal; there were no regional wall motion abnormalities. There was an increased relative contribution of atrial contraction to ventricular filling. Doppler parameters are consistent with abnormal left ventricular relaxation (grade 1 diastolic dysfunction). There was no evidence of elevated ventricular filling pressure by Doppler parameters. - Aortic valve: Trileaflet; mildly thickened leaflets. There was no stenosis. - Mitral valve: No systolic motion of the anterior mitral leaflet was noted. A small torn chord was appreciated, with no evidence of mitral regurgitation. Calcified annulus. - Left atrium: The atrium was mildly dilated.  Assessment and Plan:  1. Symptomatically stable CAD status post CABG in March 2015. ECG reviewed. No changes made to current regimen, we will continue observation.  2. Hyperlipidemia, on Pravachol. Due for follow-up lab work with Dr. Luan Pulling in March.  3. Essential hypertension, blood pressure was elevated today. I asked him to keep an eye on this with his home blood pressure checks. No changes made to regimen for now.  Current medicines were reviewed with the patient today.   Orders Placed This Encounter  Procedures  . EKG 12-Lead    Disposition: FU with me in 6 months.   Signed, Satira Sark, MD,  [facility] 09/01/2015 8:38 AM    [facility] [place], [place], [place] [address] Phone: [phone]; Fax: [phone]

## 2015-10-30 DIAGNOSIS — Z23 Encounter for immunization: Secondary | ICD-10-CM | POA: Diagnosis not present

## 2015-10-30 DIAGNOSIS — D225 Melanocytic nevi of trunk: Secondary | ICD-10-CM | POA: Diagnosis not present

## 2015-10-30 DIAGNOSIS — Z85828 Personal history of other malignant neoplasm of skin: Secondary | ICD-10-CM | POA: Diagnosis not present

## 2015-10-30 DIAGNOSIS — L570 Actinic keratosis: Secondary | ICD-10-CM | POA: Diagnosis not present

## 2015-10-30 DIAGNOSIS — Z86018 Personal history of other benign neoplasm: Secondary | ICD-10-CM | POA: Diagnosis not present

## 2015-10-30 DIAGNOSIS — D485 Neoplasm of uncertain behavior of skin: Secondary | ICD-10-CM | POA: Diagnosis not present

## 2015-10-30 DIAGNOSIS — L821 Other seborrheic keratosis: Secondary | ICD-10-CM | POA: Diagnosis not present

## 2015-10-30 DIAGNOSIS — D0461 Carcinoma in situ of skin of right upper limb, including shoulder: Secondary | ICD-10-CM | POA: Diagnosis not present

## 2015-11-12 DIAGNOSIS — L570 Actinic keratosis: Secondary | ICD-10-CM | POA: Diagnosis not present

## 2015-11-28 DIAGNOSIS — I119 Hypertensive heart disease without heart failure: Secondary | ICD-10-CM | POA: Diagnosis not present

## 2015-11-28 DIAGNOSIS — E785 Hyperlipidemia, unspecified: Secondary | ICD-10-CM | POA: Diagnosis not present

## 2015-11-28 DIAGNOSIS — Z125 Encounter for screening for malignant neoplasm of prostate: Secondary | ICD-10-CM | POA: Diagnosis not present

## 2015-11-28 DIAGNOSIS — I2510 Atherosclerotic heart disease of native coronary artery without angina pectoris: Secondary | ICD-10-CM | POA: Diagnosis not present

## 2015-11-28 DIAGNOSIS — M1990 Unspecified osteoarthritis, unspecified site: Secondary | ICD-10-CM | POA: Diagnosis not present

## 2015-11-28 DIAGNOSIS — I10 Essential (primary) hypertension: Secondary | ICD-10-CM | POA: Diagnosis not present

## 2015-11-28 DIAGNOSIS — Z0000 Encounter for general adult medical examination without abnormal findings: Secondary | ICD-10-CM | POA: Diagnosis not present

## 2015-12-11 DIAGNOSIS — D485 Neoplasm of uncertain behavior of skin: Secondary | ICD-10-CM | POA: Diagnosis not present

## 2015-12-11 DIAGNOSIS — L570 Actinic keratosis: Secondary | ICD-10-CM | POA: Diagnosis not present

## 2015-12-11 DIAGNOSIS — D0461 Carcinoma in situ of skin of right upper limb, including shoulder: Secondary | ICD-10-CM | POA: Diagnosis not present

## 2015-12-25 DIAGNOSIS — M4316 Spondylolisthesis, lumbar region: Secondary | ICD-10-CM | POA: Diagnosis not present

## 2015-12-25 DIAGNOSIS — M47816 Spondylosis without myelopathy or radiculopathy, lumbar region: Secondary | ICD-10-CM | POA: Diagnosis not present

## 2016-02-23 DIAGNOSIS — M19029 Primary osteoarthritis, unspecified elbow: Secondary | ICD-10-CM | POA: Diagnosis not present

## 2016-02-24 DIAGNOSIS — H2511 Age-related nuclear cataract, right eye: Secondary | ICD-10-CM | POA: Diagnosis not present

## 2016-02-24 DIAGNOSIS — Z961 Presence of intraocular lens: Secondary | ICD-10-CM | POA: Diagnosis not present

## 2016-03-08 NOTE — Progress Notes (Signed)
Cardiology Office Note  Date: 03/09/2016   ID: JESSUS TWOHIG, DOB 1945/11/19, MRN [id number]  PCP: Alonza Bogus, MD  Primary Cardiologist: Rozann Lesches, MD   Chief Complaint  Patient presents with  . Coronary Artery Disease    History of Present Illness: Brandon [name] is a 70 y.o. male last seen in January. He presents for a routine follow-up visit. Overall doing very well, still works full-time in [employer]. He has been spending a lot of time outdoors. Does not report any angina symptoms and has NYHA class II dyspnea.  I reviewed his medications. Cardiac regimen includes aspirin, Pravachol, Lopressor, HCTZ, and potassium. He reports compliance in no obvious intolerances.  Reports improved leg and back pain after undergoing surgery with Dr. Carloyn Manner back in December 2016.  He had follow-up lab work done in April, outlined below. Lipids are well controlled on Pravachol.  Past Medical History:  Diagnosis Date  . Cataract   . Coronary atherosclerosis of native coronary artery    Multivessel status post CABG March 2015  . DDD (degenerative disc disease), lumbar    Dr. Carloyn Manner  . Essential hypertension, benign   . GERD (gastroesophageal reflux disease)   . Hard of hearing   . Hemorrhoids   . History of gout   . History of [facility] spotted fever   . Lumbago   . Mixed hyperlipidemia   . Renal calculus or stone 05/22/14    Past Surgical History:  Procedure Laterality Date  . AMPUTATION Right 03/27/2014   Procedure: DEBRIDEMENT AND CLOSURE RIGHT INDEX FINGER;  Surgeon: [name], MD;  Location: [facility];  Service: Orthopedics;  Laterality: Right;  . BICEPT TENODESIS  11/26/2011   Procedure: BICEPT TENODESIS;  Surgeon: Augustin Schooling, MD;  Location: [facility];  Service: Orthopedics;  Laterality: Left;  . Cataract surgery Left   . COLONOSCOPY    . COLONOSCOPY N/A 12/26/2014   Procedure: COLONOSCOPY;  Surgeon: Rogene Houston, MD;  [facility];   Service: Endoscopy;  Laterality: N/A;  730  . CORONARY ARTERY BYPASS GRAFT N/A 10/19/2013   Procedure: CORONARY ARTERY BYPASS GRAFTING (CABG);  Surgeon: Rexene Alberts, MD;  Location: [facility];  Service: Open Heart Surgery;  Laterality: N/A;  CABG times four using left internal mammary artery and left leg saphenous vein, incision made on right leg but no vein removed  . INTRAOPERATIVE TRANSESOPHAGEAL ECHOCARDIOGRAM N/A 10/19/2013   Procedure: INTRAOPERATIVE TRANSESOPHAGEAL ECHOCARDIOGRAM;  Surgeon: Rexene Alberts, MD;  Location: [facility];  Service: Open Heart Surgery;  Laterality: N/A;  . JOINT REPLACEMENT    . LEFT HEART CATHETERIZATION WITH CORONARY ANGIOGRAM N/A 10/09/2013   Procedure: LEFT HEART CATHETERIZATION WITH CORONARY ANGIOGRAM;  Surgeon: Burnell Blanks, MD;  Location: [facility] CATH LAB;  Service: Cardiovascular;  Laterality: N/A;  . Left shoulder rotator cuff repair    . LITHOTRIPSY    . TOTAL KNEE ARTHROPLASTY Left   . VASECTOMY      Current Outpatient Prescriptions  Medication Sig Dispense Refill  . acetaminophen (TYLENOL) 500 MG tablet Take 1,000 mg by mouth daily. For pain    . allopurinol (ZYLOPRIM) 300 MG tablet Take 300 mg by mouth daily.    . Ascorbic Acid (VITAMIN C PO) Take 1 tablet by mouth daily.    Marland Kitchen aspirin 325 MG EC tablet Take 325 mg by mouth daily.    . Glucosamine-Chondroitin (OSTEO BI-FLEX REGULAR STRENGTH PO) Take 1 tablet by mouth daily.    Marland Kitchen  hydrochlorothiazide (HYDRODIURIL) 50 MG tablet Take 50 mg by mouth daily.    . metoprolol tartrate (LOPRESSOR) 25 MG tablet Take 25 mg by mouth 2 (two) times daily.    . Multiple Vitamin (MULITIVITAMIN WITH MINERALS) TABS Take 1 tablet by mouth daily.    . Omega-3 Fatty Acids (FISH OIL) 1200 MG CAPS Take 1 capsule by mouth daily.    . potassium chloride (K-DUR,KLOR-CON) 10 MEQ tablet Take 10 mEq by mouth daily.    . pravastatin (PRAVACHOL) 40 MG tablet Take 40 mg by mouth every evening.     . ranitidine (ZANTAC) 75 MG tablet  Take 75 mg by mouth 2 (two) times daily.     No current facility-administered medications for this visit.    Allergies:  Review of patient's allergies indicates no known allergies.   Social History: The patient  reports that he quit smoking about 32 years ago. His smoking use included Cigarettes. He started smoking about 57 years ago. He has a 60.00 pack-year smoking history. He has quit using smokeless tobacco. His smokeless tobacco use included Chew. He reports that he drinks alcohol. He reports that he does not use drugs.   ROS:  Please see the history of present illness. Otherwise, complete review of systems is positive for mild arthritic stiffness.  All other systems are reviewed and negative.   Physical Exam: VS:  BP (!) 148/82   Pulse 60   Ht 6\' 1"  (1.854 m)   Wt 215 lb (97.5 kg)   SpO2 97%   BMI 28.37 kg/m , BMI Body mass index is 28.37 kg/m.  Wt Readings from Last 3 Encounters:  03/09/16 215 lb (97.5 kg)  09/01/15 219 lb (99.3 kg)  02/27/15 213 lb (96.6 kg)    Patient appears comfortable at rest.  HEENT: Conjunctiva and lids normal, oropharynx clear.  Neck: Supple, no elevated JVP or carotid bruits, no thyromegaly.  Lungs: Clear to auscultation, nonlabored breathing at rest.  Cardiac: Regular rate and rhythm, no S3 or significant systolic murmur, no pericardial rub.  Abdomen: Brace in place. Extremities: No pitting edema, distal pulses 2+. Skin: Warm and dry. Musculoskeletal: No kyphosis. Neuropsychiatric: Alert and oriented 3, affect appropriate.  ECG: I personally reviewed the tracing from 09/01/2015 which showed sinus rhythm with old inferior infarct pattern.  Recent Labwork:  March 2016: Cholesterol 161, triglycerides 86, HDL 57, LDL 87, AST 26, ALT 10 December 2015: Hgb 15.2, platelets 210, potassium 3.9, BUN 22, creatinine 0.8, cholesterol 142, triglycerides 123, LDL 69, HDL 48  Other Studies Reviewed Today:  Echocardiogram 09/19/2013: Study  Conclusions  - Left ventricle: Normal left ventricular outflow tract gradient. Wall thickness was increased in a pattern of severe LVH. Systolic function was normal. The estimated ejection fraction was in the range of 60% to 65%. Wall motion was normal; there were no regional wall motion abnormalities. There was an increased relative contribution of atrial contraction to ventricular filling. Doppler parameters are consistent with abnormal left ventricular relaxation (grade 1 diastolic dysfunction). There was no evidence of elevated ventricular filling pressure by Doppler parameters. - Aortic valve: Trileaflet; mildly thickened leaflets. There was no stenosis. - Mitral valve: No systolic motion of the anterior mitral leaflet was noted. A small torn chord was appreciated, with no evidence of mitral regurgitation. Calcified annulus. - Left atrium: The atrium was mildly dilated.  Assessment and Plan:  1. Multivessel CAD status post CABG in 2015. LVEF normal range. Patient is doing well at this time on medical therapy  without significant angina symptoms.  2. Essential hypertension, continue current regimen and keep follow-up with Dr. Luan Pulling. Encouraged regular activity and sodium restriction.  3. Hyperlipidemia, maintains on Pravachol with good lipid control, last LDL 69.  Current medicines were reviewed with the patient today.  Disposition: Follow-up with me in one year.  Signed, Satira Sark, MD, [facility] 03/09/2016 8:22 AM    [facility] at [facility] [address], [place], [place] [address] Phone: [phone]; Fax: [phone]

## 2016-03-09 ENCOUNTER — Ambulatory Visit (INDEPENDENT_AMBULATORY_CARE_PROVIDER_SITE_OTHER): Payer: Medicare Other | Admitting: Cardiology

## 2016-03-09 ENCOUNTER — Encounter: Payer: Self-pay | Admitting: Cardiology

## 2016-03-09 ENCOUNTER — Encounter (INDEPENDENT_AMBULATORY_CARE_PROVIDER_SITE_OTHER): Payer: Self-pay

## 2016-03-09 VITALS — BP 148/82 | HR 60 | Ht 73.0 in | Wt 215.0 lb

## 2016-03-09 DIAGNOSIS — I2510 Atherosclerotic heart disease of native coronary artery without angina pectoris: Secondary | ICD-10-CM

## 2016-03-09 DIAGNOSIS — I10 Essential (primary) hypertension: Secondary | ICD-10-CM

## 2016-03-09 DIAGNOSIS — E782 Mixed hyperlipidemia: Secondary | ICD-10-CM

## 2016-03-09 NOTE — Patient Instructions (Signed)
Your physician wants you to follow-up in: 1 year You will receive a reminder letter in the mail two months in advance. If you don't receive a letter, please call our office to schedule the follow-up appointment.    Your physician recommends that you continue on your current medications as directed. Please refer to the Current Medication list given to you today.     Thank you for choosing [facility] !  

## 2016-03-30 DIAGNOSIS — M47816 Spondylosis without myelopathy or radiculopathy, lumbar region: Secondary | ICD-10-CM | POA: Diagnosis not present

## 2016-04-30 DIAGNOSIS — Z0000 Encounter for general adult medical examination without abnormal findings: Secondary | ICD-10-CM | POA: Diagnosis not present

## 2016-04-30 DIAGNOSIS — Z23 Encounter for immunization: Secondary | ICD-10-CM | POA: Diagnosis not present

## 2016-06-14 DIAGNOSIS — M19021 Primary osteoarthritis, right elbow: Secondary | ICD-10-CM | POA: Diagnosis not present

## 2016-06-14 DIAGNOSIS — M25521 Pain in right elbow: Secondary | ICD-10-CM | POA: Diagnosis not present

## 2016-07-06 DIAGNOSIS — M47816 Spondylosis without myelopathy or radiculopathy, lumbar region: Secondary | ICD-10-CM | POA: Diagnosis not present

## 2016-07-12 DIAGNOSIS — N202 Calculus of kidney with calculus of ureter: Secondary | ICD-10-CM | POA: Diagnosis not present

## 2016-07-12 DIAGNOSIS — N132 Hydronephrosis with renal and ureteral calculous obstruction: Secondary | ICD-10-CM | POA: Diagnosis not present

## 2016-07-26 DIAGNOSIS — N201 Calculus of ureter: Secondary | ICD-10-CM | POA: Diagnosis not present

## 2016-08-10 DIAGNOSIS — N202 Calculus of kidney with calculus of ureter: Secondary | ICD-10-CM | POA: Diagnosis not present

## 2016-08-10 DIAGNOSIS — N201 Calculus of ureter: Secondary | ICD-10-CM | POA: Diagnosis not present

## 2016-08-13 ENCOUNTER — Other Ambulatory Visit: Payer: Self-pay | Admitting: Urology

## 2016-08-18 ENCOUNTER — Encounter (HOSPITAL_COMMUNITY): Payer: Self-pay

## 2016-08-18 NOTE — Patient Instructions (Addendum)
Brandon [name]  08/18/2016   Your procedure is scheduled on: 08/24/2016    Report to [facility] Main  Entrance take [facility]  elevators to 3rd floor to  [place] at   530 AM.  Call this number if you have problems the morning of surgery [phone]   Remember: ONLY 1 PERSON MAY GO WITH YOU TO SHORT STAY TO GET  READY MORNING OF [place].  Do not eat food or drink liquids :After Midnight.     Take these medicines the morning of surgery with A SIP OF WATER: Allopurinol, Metoprolol ( Lopressor), Zantac, Flomax                                 You may not have any metal on your body including hair pins and              piercings  Do not wear jewelry, , lotions, powders or perfumes, deodorant                         Men may shave face and neck.   Do not bring valuables to the hospital. [place].  Contacts, dentures or bridgework may not be worn into surgery.       Patients discharged the day of surgery will not be allowed to drive home.  Name and phone number of your driver:  Special Instructions: N/A              Please read over the following fact sheets you were given: _____________________________________________________________________             [facility] - Preparing for Surgery Before surgery, you can play an important role.  Because skin is not sterile, your skin needs to be as free of germs as possible.  You can reduce the number of germs on your skin by washing with CHG (chlorahexidine gluconate) soap before surgery.  CHG is an antiseptic cleaner which kills germs and bonds with the skin to continue killing germs even after washing. Please DO NOT use if you have an allergy to CHG or antibacterial soaps.  If your skin becomes reddened/irritated stop using the CHG and inform your nurse when you arrive at Short Stay. Do not shave (including legs and underarms) for at least 48 hours prior to  the first CHG shower.  You may shave your face/neck. Please follow these instructions carefully:  1.  Shower with CHG Soap the night before surgery and the  morning of Surgery.  2.  If you choose to wash your hair, wash your hair first as usual with your  normal  shampoo.  3.  After you shampoo, rinse your hair and body thoroughly to remove the  shampoo.                           4.  Use CHG as you would any other liquid soap.  You can apply chg directly  to the skin and wash                       Gently with a scrungie or clean washcloth.  5.  Apply the CHG Soap to your body ONLY FROM THE NECK DOWN.   Do not use on face/ open                           Wound or open sores. Avoid contact with eyes, ears mouth and genitals (private parts).                       Wash face,  Genitals (private parts) with your normal soap.             6.  Wash thoroughly, paying special attention to the area where your surgery  will be performed.  7.  Thoroughly rinse your body with warm water from the neck down.  8.  DO NOT shower/wash with your normal soap after using and rinsing off  the CHG Soap.                9.  Pat yourself dry with a clean towel.            10.  Wear clean pajamas.            11.  Place clean sheets on your bed the night of your first shower and do not  sleep with pets. Day of Surgery : Do not apply any lotions/deodorants the morning of surgery.  Please wear clean clothes to the hospital/surgery center.  FAILURE TO FOLLOW THESE INSTRUCTIONS MAY RESULT IN THE CANCELLATION OF YOUR SURGERY PATIENT SIGNATURE_________________________________  NURSE SIGNATURE__________________________________  ________________________________________________________________________

## 2016-08-20 ENCOUNTER — Encounter (HOSPITAL_COMMUNITY)
Admission: RE | Admit: 2016-08-20 | Discharge: 2016-08-20 | Disposition: A | Discharge status: Home / Self Care | Payer: Medicare Other | Source: Ambulatory Visit | Attending: Urology | Admitting: Urology

## 2016-08-20 ENCOUNTER — Encounter (HOSPITAL_COMMUNITY): Payer: Self-pay

## 2016-08-20 DIAGNOSIS — Z01818 Encounter for other preprocedural examination: Secondary | ICD-10-CM | POA: Diagnosis not present

## 2016-08-20 HISTORY — DX: Personal history of urinary calculi: Z87.442

## 2016-08-20 LAB — BASIC METABOLIC PANEL
Anion gap: 11 (ref 5–15)
BUN: 18 mg/dL (ref 6–20)
CO2: 25 mmol/L (ref 22–32)
Calcium: 9.4 mg/dL (ref 8.9–10.3)
Chloride: 102 mmol/L (ref 101–111)
Creatinine, Ser: 0.87 mg/dL (ref 0.61–1.24)
GFR calc Af Amer: >60 mL/min (ref >60)
GFR calc non Af Amer: >60 mL/min (ref >60)
Glucose, Bld: 111 mg/dL — ABNORMAL HIGH (ref 65–99)
Potassium: 4 mmol/L (ref 3.5–5.1)
Sodium: 138 mmol/L (ref 135–145)

## 2016-08-20 LAB — CBC
HCT: 44.7 % (ref 39.0–52.0)
Hemoglobin: 15.2 g/dL (ref 13.0–17.0)
MCH: 30.6 pg (ref 26.0–34.0)
MCHC: 34 g/dL (ref 30.0–36.0)
MCV: 89.9 fL (ref 78.0–100.0)
Platelets: 241 10*3/uL (ref 150–400)
RBC: 4.97 MIL/uL (ref 4.22–5.81)
RDW: 13.7 % (ref 11.5–15.5)
WBC: 6.8 10*3/uL (ref 4.0–10.5)

## 2016-08-20 NOTE — Progress Notes (Signed)
EKG 09/01/15 epic, 03/09/16 LOV Card- epic

## 2016-08-23 NOTE — H&P (Signed)
CC/HPI: Mr Brandon [name] returns today for follow-up evaluation regarding the passage of a left ureteral calculi. He continues to have intermittent symptoms of increased frequency, urgency, left lower quadrant abdominal pain radiating into the left lower back, and changes in urine stream. He has not passed any stone material. He is currently without symptoms but that varies from day to day. Denies fevers or other signs and symptoms of infection.     CC: I have kidney stones.  HPI: Brandon [name] is a 71 year-old male established patient who is here for renal calculi.  The problem is on the left side. He first stated noticing pain on approximately 07/05/2016. This is not his first kidney stone. He is not currently having flank pain, back pain, groin pain, nausea, vomiting, fever or chills. He has not caught a stone in his urine strainer since his symptoms began.   He has had eswl, ureteral stent, and ureteroscopy for treatment of his stones in the past.     ALLERGIES: No Allergies    MEDICATIONS: Tamsulosin Hcl 0.4 mg capsule, ext release 24 hr 1 capsule PO Daily  Allopurinol 300 MG Oral Tablet Oral  Aspirin 325 MG Oral Tablet Oral  Fish Oil CAPS Oral  HydroCHLOROthiazide 50 MG Oral Tablet Oral  Hydromorphone Hcl 2 mg tablet 1 tablet PO Q 6 H  Klor-Con 10 10 MEQ Oral Tablet Extended Release Oral  Mobic 7.5 MG Oral Tablet Oral  Multi-Vitamin TABS Oral  Osteo Bi-Flex Regular Strength TABS Oral  Pravachol 40 MG Oral Tablet Oral  Tylenol  Vitamin C TABS Oral  Zantac 150 mg tablet     GU PSH: Cystoscopy Ureteroscopy - 2015 Renal ESWL - 2010      PSH Notes: Cystoscopy With Ureteroscopy, Hand Surgery, CABG (CABG), Knee Replacement, Lithotripsy   NON-GU PSH: Back Surgery (Unspecified) Carpal Tunnel Surgery Coronary Artery Bypass Grafting (cabg) - 2015 Revise Knee Joint - 2015    GU PMH: Calculus Ureter, Left - 07/12/2016, Calculus of ureter, - 2015 Kidney Stone, Bilateral kidney stones -  2015 Low back pain, Left-sided low back pain without sciatica - 2015 Bladder-neck obstruction, Bladder neck contracture - 2014 Personal Hx urinary calculi, Nephrolithiasis - 2014 Renal Cysts, Simple, Renal cyst, acquired - 2014      PMH Notes:  2009-01-16 10:23:44 - Note: Arthritis   NON-GU PMH: Encounter for general adult medical examination without abnormal findings, Encounter for preventive health examination - 2015 Personal history of other diseases of the musculoskeletal system and connective tissue, History of gout - 2015 Personal history of other diseases of the circulatory system, History of hypertension - 2014 Personal history of other diseases of the digestive system, History of esophageal reflux - 2014 Personal history of other endocrine, nutritional and metabolic disease, History of hypercholesterolemia - 2014    FAMILY HISTORY: Cardiac Failure - Mother Death In The Family Father - Father Death In The Family Mother - Mother Family Health Status Number - Runs In Family Heart Disease - Runs In Family Hypertension - Runs In Family Lung Cancer - Father nephrolithiasis - Runs In Family   SOCIAL HISTORY: Marital Status: Married Current Smoking Status: Patient does not smoke anymore.  Social Drinker.  Drinks 3 caffeinated drinks per day. 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     Notes: Occupation:, Caffeine Use, Marital History - Currently Married, Previous History Of Smoking, Alcohol Use   REVIEW OF SYSTEMS:    GU Review Male:   Patient reports frequent urination, hard to postpone urination,  and get up at night to urinate. Patient denies burning/ pain with urination, leakage of urine, stream starts and stops, trouble starting your stream, have to strain to urinate , erection problems, and penile pain.  Gastrointestinal (Upper):   Patient denies nausea, vomiting, and indigestion/ heartburn.  Gastrointestinal (Lower):   Patient denies diarrhea and constipation.   Constitutional:   Patient denies fever, night sweats, weight loss, and fatigue.  Skin:   Patient denies skin rash/ lesion and itching.  Eyes:   Patient denies blurred vision and double vision.  Ears/ Nose/ Throat:   Patient denies sore throat and sinus problems.  Hematologic/Lymphatic:   Patient denies swollen glands and easy bruising.  Cardiovascular:   Patient denies leg swelling and chest pains.  Respiratory:   Patient denies cough and shortness of breath.  Endocrine:   Patient denies excessive thirst.  Musculoskeletal:   Patient denies joint pain and back pain.  Neurological:   Patient denies headaches and dizziness.  Psychologic:   Patient denies depression and anxiety.   VITAL SIGNS:      08/10/2016 09:18 AM  BP 136/86 mmHg  Pulse 57 /min  Temperature 97.9 F / 37 C   MULTI-SYSTEM PHYSICAL EXAMINATION:    Constitutional: Well-nourished. No physical deformities. Normally developed. Good grooming.  Respiratory: No labored breathing, no use of accessory muscles. CTA  Cardiovascular: Normal temperature, RRR without murmur  Skin: No paleness, no jaundice, no cyanosis. No lesion, no ulcer, no rash.  Neurologic / Psychiatric: Oriented to time, oriented to place, oriented to person. No depression, no anxiety, no agitation.  Gastrointestinal: Obese abdomen. No hernia. No mass, no tenderness, no rigidity. No flank tenderness.  Musculoskeletal: Spine, ribs, pelvis no bilateral tenderness. Normal gait and station of head and neck.     PAST DATA REVIEWED:  Source Of History:  Patient  Records Review:   Previous Patient Records  Urine Test Review:   Urinalysis  X-Ray Review: KUB: Reviewed Films.  C.T. Stone Protocol: Reviewed Films.     11/20/04  PSA  Total PSA 0.61     08/10/16  Urinalysis  Urine Appearance Clear   Urine Color Yellow   Urine Glucose Neg   Urine Bilirubin Neg   Urine Ketones Trace   Urine Specific Gravity 1.025   Urine Blood Neg   Urine pH 6.0   Urine  Protein Trace   Urine Urobilinogen 0.2   Urine Nitrites Neg   Urine Leukocyte Esterase Trace   Urine WBC/hpf 0 - 5/hpf   Urine RBC/hpf 0 - 2/hpf   Urine Epithelial Cells NS (Not Seen)   Urine Bacteria NS (Not Seen)   Urine Mucous Not Present   Urine Yeast NS (Not Seen)   Urine Trichomonas Not Present   Urine Cystals Amorph Urates   Urine Casts NS (Not Seen)   Urine Sperm Not Present    PROCEDURES:         C.T. Urogram - 74176               KUB - 74000  A single view of the abdomen is obtained.      Imaging remains grossly unchanged from previous office visit. There remains a questionable calculi within the anatomical expected tracts of the distal left ureter and mid ureter with my suspicion being for the former in the distal ureter and then the latter in the mid ureter. The bilateral contours of the renal shadows contained nonobstructing calculi. The anatomical expected tract of the right ureter appears  clear. Bowel gas pattern appears normal. There are stable phlebolith present. He has lower lumbar spinal hardware. Bladder appears free of obstruction.         Urinalysis w/Scope Dipstick Dipstick Cont'd Micro  Color: Yellow Bilirubin: Neg WBC/hpf: 0 - 5/hpf  Appearance: Clear Ketones: Trace RBC/hpf: 0 - 2/hpf  Specific Gravity: 1.025 Blood: Neg Bacteria: NS (Not Seen)  pH: 6.0 Protein: Trace Cystals: Amorph Urates  Glucose: Neg Urobilinogen: 0.2 Casts: NS (Not Seen)    Nitrites: Neg Trichomonas: Not Present    Leukocyte Esterase: Trace Mucous: Not Present      Epithelial Cells: NS (Not Seen)      Yeast: NS (Not Seen)      Sperm: Not Present    ASSESSMENT:      ICD-10 Details  1 GU:   Calculus Ureter - N20.1 Left   PLAN:           Orders Labs Urine Culture and Sensitivity  X-Rays: C.T. Stone Protocol Without Contrast  X-Ray Notes: History:  Hematuria: Yes/No  Patient to see MD after exam: Yes/No  Previous exam: CT / IVP/ US/ KUB/  None  When:  Where:  Diabetic: Yes/ No  BUN/ Creatinine:  Date of last BUN Creatinine:  Weight in pounds:  Allergy- IV Contrast: Yes/ No  Conflicting diabetic meds: Yes/ No  Diabetic Meds:  Prior Authorization #: [id number]          Schedule Return Visit/Planned Activity: 1-2 Weeks - Follow up MD, Schedule Surgery          Document Letter(s):  Created for Patient: Clinical Summary         Notes:   Calculus noted in the distal left ureter. Seen on KUB and CT imaging. Minimal-mild hydroureter present. He has additional bilateral nonobstructive calculi.   Patient understands risks and benefits of both ESWL vs URS vs MET. Having not passed a stone and still with intermittent symptoms he is eager to proceed with a definitive procedure. He has had ESWL and URS in the past. His preference is URS. I will discuss with Dr Jeffie Pollock and f/u with the patient.

## 2016-08-24 ENCOUNTER — Ambulatory Visit (HOSPITAL_COMMUNITY)
Admission: RE | Admit: 2016-08-24 | Discharge: 2016-08-24 | Disposition: A | Discharge status: Home / Self Care | Payer: Medicare Other | Source: Ambulatory Visit | Attending: Urology | Admitting: Urology

## 2016-08-24 ENCOUNTER — Encounter (HOSPITAL_COMMUNITY)
Admission: RE | Disposition: A | Discharge status: Home / Self Care | Payer: Self-pay | Source: Ambulatory Visit | Attending: Urology

## 2016-08-24 ENCOUNTER — Encounter (HOSPITAL_COMMUNITY): Payer: Self-pay

## 2016-08-24 ENCOUNTER — Ambulatory Visit (HOSPITAL_COMMUNITY): Payer: Medicare Other | Admitting: Certified Registered Nurse Anesthetist

## 2016-08-24 ENCOUNTER — Ambulatory Visit (HOSPITAL_COMMUNITY): Payer: Medicare Other

## 2016-08-24 DIAGNOSIS — I10 Essential (primary) hypertension: Secondary | ICD-10-CM | POA: Insufficient documentation

## 2016-08-24 DIAGNOSIS — N134 Hydroureter: Secondary | ICD-10-CM | POA: Insufficient documentation

## 2016-08-24 DIAGNOSIS — E7800 Pure hypercholesterolemia, unspecified: Secondary | ICD-10-CM | POA: Diagnosis not present

## 2016-08-24 DIAGNOSIS — Z96659 Presence of unspecified artificial knee joint: Secondary | ICD-10-CM | POA: Insufficient documentation

## 2016-08-24 DIAGNOSIS — N201 Calculus of ureter: Secondary | ICD-10-CM | POA: Diagnosis not present

## 2016-08-24 DIAGNOSIS — Z79891 Long term (current) use of opiate analgesic: Secondary | ICD-10-CM | POA: Diagnosis not present

## 2016-08-24 DIAGNOSIS — I4891 Unspecified atrial fibrillation: Secondary | ICD-10-CM | POA: Diagnosis not present

## 2016-08-24 DIAGNOSIS — M109 Gout, unspecified: Secondary | ICD-10-CM | POA: Diagnosis not present

## 2016-08-24 DIAGNOSIS — Z791 Long term (current) use of non-steroidal anti-inflammatories (NSAID): Secondary | ICD-10-CM | POA: Insufficient documentation

## 2016-08-24 DIAGNOSIS — Z79899 Other long term (current) drug therapy: Secondary | ICD-10-CM | POA: Diagnosis not present

## 2016-08-24 DIAGNOSIS — Z87891 Personal history of nicotine dependence: Secondary | ICD-10-CM | POA: Insufficient documentation

## 2016-08-24 DIAGNOSIS — Z7982 Long term (current) use of aspirin: Secondary | ICD-10-CM | POA: Diagnosis not present

## 2016-08-24 DIAGNOSIS — I429 Cardiomyopathy, unspecified: Secondary | ICD-10-CM | POA: Diagnosis not present

## 2016-08-24 DIAGNOSIS — Z951 Presence of aortocoronary bypass graft: Secondary | ICD-10-CM | POA: Insufficient documentation

## 2016-08-24 HISTORY — PX: CYSTOSCOPY WITH RETROGRADE PYELOGRAM, URETEROSCOPY AND STENT PLACEMENT: SHX5789

## 2016-08-24 SURGERY — CYSTOURETEROSCOPY, WITH RETROGRADE PYELOGRAM AND STENT INSERTION
Anesthesia: General | Laterality: Left

## 2016-08-24 MED ORDER — EPHEDRINE 5 MG/ML INJ
INTRAVENOUS | Status: AC
  Filled 2016-08-24: qty 10

## 2016-08-24 MED ORDER — OXYCODONE HCL 5 MG PO TABS: 5.0000 mg | ORAL_TABLET | ORAL | Status: DC | PRN

## 2016-08-24 MED ORDER — LIDOCAINE HCL 2 % EX GEL
CUTANEOUS | Status: AC
  Filled 2016-08-24: qty 5

## 2016-08-24 MED ORDER — SODIUM CHLORIDE 0.9 % IR SOLN
Status: DC | PRN
  Administered 2016-08-24: 3000 mL

## 2016-08-24 MED ORDER — IOHEXOL 300 MG/ML  SOLN
INTRAMUSCULAR | Status: DC | PRN
  Administered 2016-08-24: 2 mL

## 2016-08-24 MED ORDER — ONDANSETRON HCL 4 MG/2ML IJ SOLN
INTRAMUSCULAR | Status: DC | PRN
  Administered 2016-08-24: 4 mg via INTRAVENOUS

## 2016-08-24 MED ORDER — CEFAZOLIN SODIUM-DEXTROSE 2-4 GM/100ML-% IV SOLN
INTRAVENOUS | Status: AC
  Filled 2016-08-24: qty 100

## 2016-08-24 MED ORDER — LACTATED RINGERS IV SOLN
INTRAVENOUS | Status: DC | PRN
  Administered 2016-08-24: 07:00:00 via INTRAVENOUS

## 2016-08-24 MED ORDER — FENTANYL CITRATE (PF) 100 MCG/2ML IJ SOLN: 25.0000 ug | INTRAMUSCULAR | Status: DC | PRN

## 2016-08-24 MED ORDER — FENTANYL CITRATE (PF) 100 MCG/2ML IJ SOLN
INTRAMUSCULAR | Status: DC | PRN
  Administered 2016-08-24: 50 ug via INTRAVENOUS
  Administered 2016-08-24: 25 ug via INTRAVENOUS

## 2016-08-24 MED ORDER — LIDOCAINE HCL (CARDIAC) 20 MG/ML IV SOLN
INTRAVENOUS | Status: DC | PRN
  Administered 2016-08-24: 60 mg via INTRAVENOUS

## 2016-08-24 MED ORDER — ONDANSETRON HCL 4 MG/2ML IJ SOLN: 4.0000 mg | Freq: Once | INTRAMUSCULAR | Status: DC | PRN

## 2016-08-24 MED ORDER — CEFAZOLIN SODIUM-DEXTROSE 2-4 GM/100ML-% IV SOLN
2.0000 g | INTRAVENOUS | Status: AC
  Administered 2016-08-24: 2 g via INTRAVENOUS
  Filled 2016-08-24: qty 100

## 2016-08-24 MED ORDER — 0.9 % SODIUM CHLORIDE (POUR BTL) OPTIME
TOPICAL | Status: DC | PRN
  Administered 2016-08-24: 1000 mL

## 2016-08-24 MED ORDER — LIDOCAINE 2% (20 MG/ML) 5 ML SYRINGE
INTRAMUSCULAR | Status: AC
  Filled 2016-08-24: qty 5

## 2016-08-24 MED ORDER — FENTANYL CITRATE (PF) 100 MCG/2ML IJ SOLN
INTRAMUSCULAR | Status: AC
  Filled 2016-08-24: qty 2

## 2016-08-24 MED ORDER — OXYCODONE HCL 5 MG/5ML PO SOLN: 5.0000 mg | Freq: Once | ORAL | Status: DC | PRN

## 2016-08-24 MED ORDER — ACETAMINOPHEN 325 MG PO TABS: 650.0000 mg | ORAL_TABLET | ORAL | Status: DC | PRN

## 2016-08-24 MED ORDER — DEXAMETHASONE SODIUM PHOSPHATE 10 MG/ML IJ SOLN
INTRAMUSCULAR | Status: AC
  Filled 2016-08-24: qty 1

## 2016-08-24 MED ORDER — OXYCODONE HCL 5 MG PO TABS: 5.0000 mg | ORAL_TABLET | Freq: Once | ORAL | Status: DC | PRN

## 2016-08-24 MED ORDER — SODIUM CHLORIDE 0.9% FLUSH: 3.0000 mL | INTRAVENOUS | Status: DC | PRN

## 2016-08-24 MED ORDER — ONDANSETRON HCL 4 MG/2ML IJ SOLN
INTRAMUSCULAR | Status: AC
  Filled 2016-08-24: qty 2

## 2016-08-24 MED ORDER — SODIUM CHLORIDE 0.9 % IV SOLN: 250.0000 mL | INTRAVENOUS | Status: DC | PRN

## 2016-08-24 MED ORDER — EPHEDRINE SULFATE 50 MG/ML IJ SOLN
INTRAMUSCULAR | Status: DC | PRN
  Administered 2016-08-24: 5 mg via INTRAVENOUS

## 2016-08-24 MED ORDER — ACETAMINOPHEN 650 MG RE SUPP
650.0000 mg | RECTAL | Status: DC | PRN
  Filled 2016-08-24: qty 1

## 2016-08-24 MED ORDER — PROPOFOL 10 MG/ML IV BOLUS
INTRAVENOUS | Status: AC
  Filled 2016-08-24: qty 20

## 2016-08-24 MED ORDER — BELLADONNA ALKALOIDS-OPIUM 16.2-60 MG RE SUPP
RECTAL | Status: DC | PRN
  Administered 2016-08-24: 1 via RECTAL

## 2016-08-24 MED ORDER — HYDROCODONE-ACETAMINOPHEN 5-325 MG PO TABS: 1.0000 | ORAL_TABLET | Freq: Four times a day (QID) | ORAL | 0 refills | Status: DC | PRN

## 2016-08-24 MED ORDER — BELLADONNA ALKALOIDS-OPIUM 16.2-60 MG RE SUPP
RECTAL | Status: AC
  Filled 2016-08-24: qty 1

## 2016-08-24 MED ORDER — PROPOFOL 10 MG/ML IV BOLUS
INTRAVENOUS | Status: DC | PRN
  Administered 2016-08-24: 20 mg via INTRAVENOUS
  Administered 2016-08-24: 150 mg via INTRAVENOUS
  Administered 2016-08-24: 30 mg via INTRAVENOUS

## 2016-08-24 MED ORDER — SODIUM CHLORIDE 0.9% FLUSH: 3.0000 mL | Freq: Two times a day (BID) | INTRAVENOUS | Status: DC

## 2016-08-24 MED ORDER — DEXAMETHASONE SODIUM PHOSPHATE 10 MG/ML IJ SOLN
INTRAMUSCULAR | Status: DC | PRN
  Administered 2016-08-24: 10 mg via INTRAVENOUS

## 2016-08-24 SURGICAL SUPPLY — 24 items
BAG URO CATCHER STRL LF (MISCELLANEOUS) ×2 IMPLANT
BASKET LASER NITINOL 1.9FR (BASKET) IMPLANT
BASKET STONE NCOMPASS (UROLOGICAL SUPPLIES) IMPLANT
BASKET ZERO TIP NITINOL 2.4FR (BASKET) ×1 IMPLANT
BSKT STON RTRVL 120 1.9FR (BASKET)
BSKT STON RTRVL ZERO TP 2.4FR (BASKET) ×1
CATH URET 5FR 28IN OPEN ENDED (CATHETERS) ×1 IMPLANT
CATH URET DUAL LUMEN 6-10FR 50 (CATHETERS) ×1 IMPLANT
CLOTH BEACON ORANGE TIMEOUT ST (SAFETY) ×2 IMPLANT
FIBER LASER FLEXIVA 1000 (UROLOGICAL SUPPLIES) IMPLANT
FIBER LASER FLEXIVA 365 (UROLOGICAL SUPPLIES) IMPLANT
FIBER LASER FLEXIVA 550 (UROLOGICAL SUPPLIES) IMPLANT
FIBER LASER TRAC TIP (UROLOGICAL SUPPLIES) IMPLANT
GLOVE SURG SS PI 8.0 STRL IVOR (GLOVE) IMPLANT
GOWN STRL REUS W/TWL XL LVL3 (GOWN DISPOSABLE) ×2 IMPLANT
GUIDEWIRE STR DUAL SENSOR (WIRE) ×2 IMPLANT
IV NS 1000ML (IV SOLUTION) ×2
IV NS 1000ML BAXH (IV SOLUTION) ×1 IMPLANT
IV NS IRRIG 3000ML ARTHROMATIC (IV SOLUTION) ×2 IMPLANT
MANIFOLD NEPTUNE II (INSTRUMENTS) ×2 IMPLANT
PACK CYSTO (CUSTOM PROCEDURE TRAY) ×2 IMPLANT
SHEATH ACCESS URETERAL 38CM (SHEATH) ×2 IMPLANT
SHEATH URET ACCESS 10/12FR (MISCELLANEOUS) IMPLANT
TUBING CONNECTING 10 (TUBING) ×2 IMPLANT

## 2016-08-24 NOTE — Brief Op Note (Signed)
08/24/2016  8:05 AM  PATIENT:  Brandon [name]  71 y.o. male  PRE-OPERATIVE DIAGNOSIS:  LEFT IRETERAL STONE  POST-OPERATIVE DIAGNOSIS:  Left Ureteral Stone  PROCEDURE:  Procedure(s): CYSTOSCOPY WITH LEFT RETROGRADE PYELOGRAM, LEFT URETEROSCOPY, BASKET EXTRACTION LEFT URETERAL STONE (Left)  SURGEON:  Surgeon(s) and Role:    * Irine Seal, MD - Primary  PHYSICIAN ASSISTANT:   ASSISTANTS: none   ANESTHESIA:   general  EBL:  No intake/output data recorded.  BLOOD ADMINISTERED:none  DRAINS: none   LOCAL MEDICATIONS USED:  NONE  SPECIMEN:  Source of Specimen:  left ureteral stone  DISPOSITION OF SPECIMEN:  to patient to bring to office.   COUNTS:  YES  TOURNIQUET:  * No tourniquets in log *  DICTATION: .Other Dictation: Dictation Number [id number]  PLAN OF CARE: Discharge to home after PACU  PATIENT DISPOSITION:  PACU - hemodynamically stable.   Delay start of Pharmacological VTE agent (>24hrs) due to surgical blood loss or risk of bleeding: not applicable

## 2016-08-24 NOTE — Anesthesia Postprocedure Evaluation (Addendum)
Anesthesia Post Note  [name]  Procedure(s) Performed: Procedure(s) (LRB): CYSTOSCOPY WITH LEFT RETROGRADE PYELOGRAM, LEFT URETEROSCOPY, BASKET EXTRACTION LEFT URETERAL STONE (Left)  Patient location during evaluation: PACU Anesthesia Type: General Level of consciousness: awake, awake and alert and oriented Pain management: pain level controlled Vital Signs Assessment: post-procedure vital signs reviewed and stable Respiratory status: spontaneous breathing, nonlabored ventilation and respiratory function stable Cardiovascular status: blood pressure returned to baseline Anesthetic complications: no       Last Vitals:  Vitals:   08/24/16 0843 08/24/16 0908  BP: 131/72 128/80  Pulse: 60 (!) 58  Resp: 15 18  Temp: 36.7 C 36.4 C    Last Pain:  Vitals:   08/24/16 0908  TempSrc: Oral  PainSc:                  Enjoli Tidd COKER

## 2016-08-24 NOTE — Anesthesia Procedure Notes (Signed)
Procedure Name: LMA Insertion Date/Time: 08/24/2016 7:39 AM Performed by: Glory Buff Pre-anesthesia Checklist: Patient identified, Emergency Drugs available, Suction available and Patient being monitored Patient Re-evaluated:Patient Re-evaluated prior to inductionOxygen Delivery Method: Circle system utilized Preoxygenation: Pre-oxygenation with 100% oxygen Intubation Type: IV induction LMA: LMA inserted LMA Size: 4.0 Number of attempts: 1 Placement Confirmation: positive ETCO2 Tube secured with: Tape Dental Injury: Teeth and Oropharynx as per pre-operative assessment

## 2016-08-24 NOTE — Transfer of Care (Signed)
Immediate Anesthesia Transfer of Care Note  [name]  Procedure(s) Performed: Procedure(s): CYSTOSCOPY WITH LEFT RETROGRADE PYELOGRAM, LEFT URETEROSCOPY, BASKET EXTRACTION LEFT URETERAL STONE (Left)  Patient Location: PACU  Anesthesia Type:General  Level of Consciousness: awake, alert  and oriented  Airway & Oxygen Therapy: Patient Spontanous Breathing and Patient connected to face mask oxygen  Post-op Assessment: Report given to RN and Post -op Vital signs reviewed and stable  Post vital signs: Reviewed and stable  Last Vitals:  Vitals:   08/24/16 0527  BP: (!) 167/89  Pulse: 66  Resp: 18  Temp: 36.4 C    Last Pain:  Vitals:   08/24/16 0548  TempSrc:   PainSc: 1       Patients Stated Pain Goal: 3 ([id number])  Complications: No apparent anesthesia complications

## 2016-08-24 NOTE — Discharge Instructions (Signed)
CYSTOSCOPY HOME CARE INSTRUCTIONS  Activity: Rest for the remainder of the day.  Do not drive or operate equipment today.  You may resume normal activities in one to two days as instructed by your physician.   Meals: Drink plenty of liquids and eat light foods such as gelatin or soup this evening.  You may return to a normal meal plan tomorrow.  Return to Work: You may return to work in one to two days or as instructed by your physician.  Special Instructions / Symptoms: Call your physician if any of these symptoms occur:   -persistent or heavy bleeding  -bleeding which continues after first few urination  -large blood clots that are difficult to pass  -urine stream diminishes or stops completely  -fever equal to or higher than 101 degrees Farenheit.  -cloudy urine with a strong, foul odor  -severe pain  Females should always wipe from front to back after elimination.  You may feel some burning pain when you urinate.  This should disappear with time.  Applying moist heat to the lower abdomen or a hot tub bath may help relieve the pain. \  Please bring your stone to the office at f/u for analysis.   Patient Signature:  ________________________________________________________  Nurse's Signature:  ________________________________________________________

## 2016-08-24 NOTE — Interval H&P Note (Signed)
History and Physical Interval Note:  He continues to have some discomfort. 08/24/2016 7:28 AM  Francene Finders  has presented today for surgery, with the diagnosis of LEFT IRETERAL STONE  The various methods of treatment have been discussed with the patient and family. After consideration of risks, benefits and other options for treatment, the patient has consented to  Procedure(s): CYSTOSCOPY WITH LEFT RETROGRADE PYELOGRAM, LEFTVURETEROSCOPY AND STENT PLACEMENT BASKET EXTRACTION HOLMIUM LASER (Left) HOLMIUM LASER APPLICATION (Left) as a surgical intervention .  The patient's history has been reviewed, patient examined, no change in status, stable for surgery.  I have reviewed the patient's chart and labs.  Questions were answered to the patient's satisfaction.     Brendyn Mclaren J

## 2016-08-24 NOTE — OR Nursing (Signed)
Left Ureteral stone sent with Dr. Jeffie Pollock

## 2016-08-24 NOTE — Anesthesia Preprocedure Evaluation (Addendum)
Anesthesia Evaluation  Patient identified by MRN, date of birth, ID band Patient awake    Reviewed: Allergy & Precautions, NPO status , Patient's Chart, lab work & pertinent test results  Airway Mallampati: II  TM Distance: >3 FB Neck ROM: Full    Dental  (+) Teeth Intact, Dental Advisory Given   Pulmonary former smoker,  breath sounds clear to auscultation        Cardiovascular hypertension, Rhythm:Regular Rate:Normal     Neuro/Psych    GI/Hepatic   Endo/Other    Renal/GU      Musculoskeletal   Abdominal   Peds  Hematology   Anesthesia Other Findings   Reproductive/Obstetrics                             Anesthesia Physical Anesthesia Plan  ASA: III  Anesthesia Plan: General   Post-op Pain Management:    Induction: Intravenous  Airway Management Planned: LMA  Additional Equipment:   Intra-op Plan:   Post-operative Plan:   Informed Consent: I have reviewed the patients History and Physical, chart, labs and discussed the procedure including the risks, benefits and alternatives for the proposed anesthesia with the patient or authorized representative who has indicated his/her understanding and acceptance.   Dental advisory given  Plan Discussed with: CRNA and Anesthesiologist  Anesthesia Plan Comments:         Anesthesia Quick Evaluation  

## 2016-08-25 NOTE — Op Note (Signed)
NAME:  NIKOLI, GROENING NO.:  [id number]  MEDICAL RECORD NO.:  [id number]  LOCATION:  [facility]                         FACILITY:  [facility]  PHYSICIAN:  Marshall Cork. Jeffie Pollock, M.D.    DATE OF BIRTH:  June 21, 1946  DATE OF PROCEDURE:  08/24/2016 DATE OF DISCHARGE:                              OPERATIVE REPORT   Patient of Dr. Irine Seal.  PROCEDURE: 1. Cystoscopy with left retrograde pyelogram and interpretation. 2. Left ureteroscopic stone extraction.  PREOPERATIVE DIAGNOSIS:  Left distal ureteral stone.  POSTOPERATIVE DIAGNOSIS:  Left distal ureteral stone.  SURGEON:  Marshall Cork. Jeffie Pollock, M.D.  ANESTHESIA:  General.  SPECIMEN:  Stone.  DRAINS:  None.  BLOOD LOSS:  None.  COMPLICATIONS:  None.  INDICATIONS:  Mr. Facemire is a 71 year old white male with a left distal ureteral stone approximately 5-6 mm, who has failed to pass the stone and has elected ureteroscopy.  FINDINGS OF PROCEDURE:  He was taken to the operating room where he was given Ancef.  A general anesthetic was induced.  He was placed in lithotomy position.  His perineum and genitalia were prepped with Betadine solution.  He was draped in usual sterile fashion.  Cystoscopy was performed using the 23-French scope with 30-degree lens. Examination revealed normal urethra.  The external sphincter was intact. The prostatic urethra had bilobar hyperplasia with mild obstruction, was approximately 3 cm in length.  The bladder wall had mild trabeculation. No tumors or stones were noted.  Ureteral orifices were in the normal anatomic position.  The right ureteral orifice was effluxing clear urine.  The left ureteral orifice actually effluxed a little debris.  A 5-French open-end catheter was used to cannulate the left ureteral orifice and contrast was instilled.  Left retrograde pyelogram revealed some minor narrowing of the intramural ureter.  There was a filling defect just proximal to this consistent with a  stone and proximal dilation to the kidney.  Once the retrograde pyelogram was performed, a guidewire was passed to the kidney and the cystoscope was removed.  A 12, 14, 38 cm access sheath was used initially with just the inner core and then the assembled sheath And used to dilate the distal ureter without difficulty.  A 6.5-French dual-lumen semi-rigid ureteroscope was then passed alongside the wire.  The stone was visualized and partially crumbled with the dilation and it was removed with a Nitinol basket.  Final inspection revealed no significant residual stone fragments.  No active bleeding or significant urethral mucosal wall injury and it was felt a stent was not indicated.  The bladder was partially drained.  The cystoscope was removed after the wire had been removed, and B and O suppository was placed.  The patient was taken down from lithotomy position.  His anesthetic was reversed. He was moved to recovery room in stable condition.  There were no complications.  The stone was given to the patient's wife to bring to the office.     Marshall Cork. Jeffie Pollock, M.D.     JJW/MEDQ  D:  08/24/2016  T:  08/25/2016  Job:  [id number]

## 2016-08-31 DIAGNOSIS — N201 Calculus of ureter: Secondary | ICD-10-CM | POA: Diagnosis not present

## 2016-08-31 DIAGNOSIS — N200 Calculus of kidney: Secondary | ICD-10-CM | POA: Diagnosis not present

## 2016-09-03 DIAGNOSIS — M722 Plantar fascial fibromatosis: Secondary | ICD-10-CM | POA: Diagnosis not present

## 2016-09-03 DIAGNOSIS — I10 Essential (primary) hypertension: Secondary | ICD-10-CM | POA: Diagnosis not present

## 2016-10-22 DIAGNOSIS — N202 Calculus of kidney with calculus of ureter: Secondary | ICD-10-CM | POA: Diagnosis not present

## 2016-11-01 DIAGNOSIS — M25521 Pain in right elbow: Secondary | ICD-10-CM | POA: Diagnosis not present

## 2016-11-01 DIAGNOSIS — M19021 Primary osteoarthritis, right elbow: Secondary | ICD-10-CM | POA: Diagnosis not present

## 2016-11-22 DIAGNOSIS — N401 Enlarged prostate with lower urinary tract symptoms: Secondary | ICD-10-CM | POA: Diagnosis not present

## 2016-11-22 DIAGNOSIS — R3915 Urgency of urination: Secondary | ICD-10-CM | POA: Diagnosis not present

## 2016-11-22 DIAGNOSIS — N200 Calculus of kidney: Secondary | ICD-10-CM | POA: Diagnosis not present

## 2016-11-22 DIAGNOSIS — R350 Frequency of micturition: Secondary | ICD-10-CM | POA: Diagnosis not present

## 2016-12-06 DIAGNOSIS — L814 Other melanin hyperpigmentation: Secondary | ICD-10-CM | POA: Diagnosis not present

## 2016-12-06 DIAGNOSIS — D225 Melanocytic nevi of trunk: Secondary | ICD-10-CM | POA: Diagnosis not present

## 2016-12-06 DIAGNOSIS — Z85828 Personal history of other malignant neoplasm of skin: Secondary | ICD-10-CM | POA: Diagnosis not present

## 2016-12-06 DIAGNOSIS — L570 Actinic keratosis: Secondary | ICD-10-CM | POA: Diagnosis not present

## 2016-12-06 DIAGNOSIS — D1801 Hemangioma of skin and subcutaneous tissue: Secondary | ICD-10-CM | POA: Diagnosis not present

## 2016-12-06 DIAGNOSIS — D485 Neoplasm of uncertain behavior of skin: Secondary | ICD-10-CM | POA: Diagnosis not present

## 2016-12-06 DIAGNOSIS — L309 Dermatitis, unspecified: Secondary | ICD-10-CM | POA: Diagnosis not present

## 2016-12-06 DIAGNOSIS — L821 Other seborrheic keratosis: Secondary | ICD-10-CM | POA: Diagnosis not present

## 2016-12-06 DIAGNOSIS — Z86018 Personal history of other benign neoplasm: Secondary | ICD-10-CM | POA: Diagnosis not present

## 2016-12-31 DIAGNOSIS — M19021 Primary osteoarthritis, right elbow: Secondary | ICD-10-CM | POA: Diagnosis not present

## 2016-12-31 DIAGNOSIS — M7021 Olecranon bursitis, right elbow: Secondary | ICD-10-CM | POA: Diagnosis not present

## 2017-03-01 DIAGNOSIS — H2511 Age-related nuclear cataract, right eye: Secondary | ICD-10-CM | POA: Diagnosis not present

## 2017-03-03 DIAGNOSIS — I10 Essential (primary) hypertension: Secondary | ICD-10-CM | POA: Diagnosis not present

## 2017-03-03 DIAGNOSIS — M1990 Unspecified osteoarthritis, unspecified site: Secondary | ICD-10-CM | POA: Diagnosis not present

## 2017-03-03 DIAGNOSIS — I2510 Atherosclerotic heart disease of native coronary artery without angina pectoris: Secondary | ICD-10-CM | POA: Diagnosis not present

## 2017-03-03 DIAGNOSIS — E785 Hyperlipidemia, unspecified: Secondary | ICD-10-CM | POA: Diagnosis not present

## 2017-03-04 ENCOUNTER — Other Ambulatory Visit: Payer: Self-pay

## 2017-03-11 NOTE — Progress Notes (Signed)
Cardiology Office Note  Date: 03/14/2017   ID: Brandon [name], DOB 01/01/1946, MRN [id number]  PCP: Brandon Du, MD  Primary Cardiologist: Brandon Lesches, MD   Chief Complaint  Patient presents with  . Coronary Artery Disease    History of Present Illness: Brandon [name] is a 71 y.o. male last seen in July 2017. He presents for a routine follow-up visit today with his wife. States that he has been doing well, continues to work outdoors regularly. Does not report any angina symptoms, no longer has a fresh bottle of nitroglycerin which we will refill.  He continues to follow with Dr. Luan [name]. He states that he will be having a physical with lab work soon. I reviewed his medications which are stable from a cardiac perspective and outlined below. He reports no intolerances with Pravachol.  I personally reviewed his ECG today which shows sinus bradycardia with nonspecific ST-T changes and probable old inferior infarct pattern.  Past Medical History:  Diagnosis Date  . Cataract   . Coronary atherosclerosis of native coronary artery    Multivessel status post CABG March 2015  . DDD (degenerative disc disease), lumbar    Dr. Carloyn [name]  . Essential hypertension, benign   . GERD (gastroesophageal reflux disease)   . Hard of hearing   . Hemorrhoids   . History of gout   . History of kidney stones   . History of [facility] spotted fever   . Lumbago   . Mixed hyperlipidemia   . Renal calculus or stone 05/22/14    Past Surgical History:  Procedure Laterality Date  . AMPUTATION Right 03/27/2014   Procedure: DEBRIDEMENT AND CLOSURE RIGHT INDEX FINGER;  Surgeon: [name], MD;  Location: [place];  Service: Orthopedics;  Laterality: Right;  . BACK SURGERY    . BICEPT TENODESIS  11/26/2011   Procedure: BICEPT TENODESIS;  Surgeon: Augustin Schooling, MD;  Location: [place];  Service: Orthopedics;  Laterality: Left;  . Cataract surgery Left   . COLONOSCOPY    . COLONOSCOPY N/A  12/26/2014   Procedure: COLONOSCOPY;  Surgeon: Brandon Houston, MD;  [facility];  Service: Endoscopy;  Laterality: N/A;  730  . CORONARY ARTERY BYPASS GRAFT N/A 10/19/2013   Procedure: CORONARY ARTERY BYPASS GRAFTING (CABG);  Surgeon: Rexene Alberts, MD;  Location: [place];  Service: Open Heart Surgery;  Laterality: N/A;  CABG times four using left internal mammary artery and left leg saphenous vein, incision made on right leg but no vein removed  . CYSTOSCOPY WITH RETROGRADE PYELOGRAM, URETEROSCOPY AND STENT PLACEMENT Left 08/24/2016   Procedure: CYSTOSCOPY WITH LEFT RETROGRADE PYELOGRAM, LEFT URETEROSCOPY, BASKET EXTRACTION LEFT URETERAL STONE;  Surgeon: Irine Seal, MD;  Location: [facility];  Service: Urology;  Laterality: Left;  . INTRAOPERATIVE TRANSESOPHAGEAL ECHOCARDIOGRAM N/A 10/19/2013   Procedure: INTRAOPERATIVE TRANSESOPHAGEAL ECHOCARDIOGRAM;  Surgeon: Rexene Alberts, MD;  Location: [place];  Service: Open Heart Surgery;  Laterality: N/A;  . JOINT REPLACEMENT    . LEFT HEART CATHETERIZATION WITH CORONARY ANGIOGRAM N/A 10/09/2013   Procedure: LEFT HEART CATHETERIZATION WITH CORONARY ANGIOGRAM;  Surgeon: Burnell Blanks, MD;  Location: [facility] CATH LAB;  Service: Cardiovascular;  Laterality: N/A;  . Left shoulder rotator cuff repair    . LITHOTRIPSY    . TOTAL KNEE ARTHROPLASTY Left   . VASECTOMY      Current Outpatient Prescriptions  Medication Sig Dispense Refill  . acetaminophen (TYLENOL) 500 MG tablet Take 500-1,000 mg by mouth  every 6 (six) hours as needed (for pain.).    Marland Kitchen allopurinol (ZYLOPRIM) 300 MG tablet Take 300 mg by mouth daily.    . Ascorbic Acid (VITAMIN C) 1000 MG tablet Take 1,000 mg by mouth daily.    Marland Kitchen aspirin EC 325 MG tablet Take 325 mg by mouth daily.    . Glucosamine-Chondroitin (OSTEO BI-FLEX REGULAR STRENGTH PO) Take 1 tablet by mouth daily.    . hydrochlorothiazide (HYDRODIURIL) 50 MG tablet Take 50 mg by mouth daily.    . meloxicam (MOBIC) 7.5 MG tablet  Take 7.5 mg by mouth at bedtime.    . metoprolol tartrate (LOPRESSOR) 25 MG tablet Take 25 mg by mouth 2 (two) times daily.    . Multiple Vitamin (MULITIVITAMIN WITH MINERALS) TABS Take 1 tablet by mouth daily.    . Omega-3 Fatty Acids (FISH OIL) 1200 MG CAPS Take 1,200 mg by mouth daily.     . potassium chloride (K-DUR,KLOR-CON) 10 MEQ tablet Take 10 mEq by mouth daily.    . pravastatin (PRAVACHOL) 40 MG tablet Take 40 mg by mouth every evening.     . ranitidine (ZANTAC) 150 MG tablet Take 150 mg by mouth daily.    . tamsulosin (FLOMAX) 0.4 MG CAPS capsule Take 0.4 mg by mouth daily.    . nitroGLYCERIN (NITROSTAT) 0.4 MG SL tablet Place 1 tablet (0.4 mg total) under the tongue every 5 (five) minutes as needed. 25 tablet 3   No current facility-administered medications for this visit.    Allergies:  Patient has no known allergies.   Social History: The patient  reports that he quit smoking about 33 years ago. His smoking use included Cigarettes. He started smoking about 58 years ago. He has a 60.00 pack-year smoking history. He has quit using smokeless tobacco. His smokeless tobacco use included Chew. He reports that he drinks alcohol. He reports that he does not use drugs.   ROS:  Please see the history of present illness. Otherwise, complete review of systems is positive for none.  All other systems are reviewed and negative.   Physical Exam: VS:  BP 140/72   Pulse (!) 53   Ht 6\' 1"  (1.854 m)   Wt 214 lb (97.1 kg)   SpO2 97%   BMI 28.23 kg/m , BMI Body mass index is 28.23 kg/m.  Wt Readings from Last 3 Encounters:  03/14/17 214 lb (97.1 kg)  08/24/16 212 lb (96.2 kg)  08/20/16 212 lb (96.2 kg)    Overweight male, appears comfortable at rest.  HEENT: Conjunctiva and lids normal, oropharynx clear.  Neck: Supple, no elevated JVP or carotid bruits, no thyromegaly.  Lungs: Clear to auscultation, nonlabored breathing at rest.  Cardiac: Regular rate and rhythm, no S3 or  significant systolic murmur, no pericardial rub.  Abdomen: Soft, nontender, bowel sounds present. Extremities: No pitting edema, distal pulses 2+. Skin: Warm and dry. Musculoskeletal: No kyphosis. Neuropsychiatric: Alert and oriented 3, affect appropriate.  ECG: I personally reviewed the tracing from 09/01/2015 which showed sinus rhythm with old inferior infarct pattern.  Recent Labwork: 08/20/2016: BUN 18; Creatinine, Ser 0.87; Hemoglobin 15.2; Platelets 241; Potassium 4.0; Sodium 138  April 2017: Hgb 15.2, platelets 210, potassium 3.9, BUN 22, creatinine 0.8, cholesterol 142, triglycerides 123, LDL 69, HDL 48  Other Studies Reviewed Today:  Echocardiogram 09/19/2013: Study Conclusions  - Left ventricle: Normal left ventricular outflow tract gradient. Wall thickness was increased in a pattern of severe LVH. Systolic function was normal. The estimated ejection  fraction was in the range of 60% to 65%. Wall motion was normal; there were no regional wall motion abnormalities. There was an increased relative contribution of atrial contraction to ventricular filling. Doppler parameters are consistent with abnormal left ventricular relaxation (grade 1 diastolic dysfunction). There was no evidence of elevated ventricular filling pressure by Doppler parameters. - Aortic valve: Trileaflet; mildly thickened leaflets. There was no stenosis. - Mitral valve: No systolic motion of the anterior mitral leaflet was noted. A small torn chord was appreciated, with no evidence of mitral regurgitation. Calcified annulus. - Left atrium: The atrium was mildly dilated.  Assessment and Plan:  1. Multivessel CAD status post CABG in March 2015. He remains symptomatically stable on medical therapy. Refill provided for as needed nitroglycerin. ECG reviewed. Continue observation.  2. Hyperlipidemia on Pravachol. He will have lab work soon with Dr. Luan [name].  3. Essential  hypertension, systolic blood pressure 997 today. No changes made in regimen. We did discuss weight loss.  4. GERD, on Zantac. No progressive symptoms and tolerating aspirin.  Current medicines were reviewed with the patient today.   Orders Placed This Encounter  Procedures  . EKG 12-Lead    Disposition: Follow-up in one year.  Signed, Satira Sark, MD, [facility] 03/14/2017 8:57 AM    [place] at [facility] [address], [place], [place] [address] Phone: [phone]; Fax: [phone]

## 2017-03-14 ENCOUNTER — Ambulatory Visit (INDEPENDENT_AMBULATORY_CARE_PROVIDER_SITE_OTHER): Payer: Medicare Other | Admitting: Cardiology

## 2017-03-14 ENCOUNTER — Encounter: Payer: Self-pay | Admitting: Cardiology

## 2017-03-14 VITALS — BP 140/72 | HR 53 | Ht 73.0 in | Wt 214.0 lb

## 2017-03-14 DIAGNOSIS — I2510 Atherosclerotic heart disease of native coronary artery without angina pectoris: Secondary | ICD-10-CM | POA: Diagnosis not present

## 2017-03-14 DIAGNOSIS — I10 Essential (primary) hypertension: Secondary | ICD-10-CM

## 2017-03-14 DIAGNOSIS — E782 Mixed hyperlipidemia: Secondary | ICD-10-CM | POA: Diagnosis not present

## 2017-03-14 DIAGNOSIS — K219 Gastro-esophageal reflux disease without esophagitis: Secondary | ICD-10-CM

## 2017-03-14 MED ORDER — NITROGLYCERIN 0.4 MG SL SUBL: 0.4000 mg | SUBLINGUAL_TABLET | SUBLINGUAL | 3 refills | Status: DC | PRN

## 2017-03-14 NOTE — Patient Instructions (Signed)
Your physician wants you to follow-up in: 1 year Dr.McDowell You will receive a reminder letter in the mail two months in advance. If you don't receive a letter, please call our office to schedule the follow-up appointment.    Your physician recommends that you continue on your current medications as directed. Please refer to the Current Medication list given to you today.     If you need a refill on your cardiac medications before your next appointment, please call your pharmacy.      No lab work or tests ordered today.      Thank you for choosing [facility] !        

## 2017-04-07 NOTE — Addendum Note (Signed)
Addendum  created 04/07/17 0926 by Roberts Gaudy, MD   Sign clinical note

## 2017-04-29 DIAGNOSIS — R0781 Pleurodynia: Secondary | ICD-10-CM | POA: Diagnosis not present

## 2017-04-29 DIAGNOSIS — I10 Essential (primary) hypertension: Secondary | ICD-10-CM | POA: Diagnosis not present

## 2017-05-23 DIAGNOSIS — N200 Calculus of kidney: Secondary | ICD-10-CM | POA: Diagnosis not present

## 2017-05-23 DIAGNOSIS — N281 Cyst of kidney, acquired: Secondary | ICD-10-CM | POA: Diagnosis not present

## 2017-06-28 DIAGNOSIS — M1990 Unspecified osteoarthritis, unspecified site: Secondary | ICD-10-CM | POA: Diagnosis not present

## 2017-06-28 DIAGNOSIS — E785 Hyperlipidemia, unspecified: Secondary | ICD-10-CM | POA: Diagnosis not present

## 2017-06-28 DIAGNOSIS — I119 Hypertensive heart disease without heart failure: Secondary | ICD-10-CM | POA: Diagnosis not present

## 2017-06-28 DIAGNOSIS — I10 Essential (primary) hypertension: Secondary | ICD-10-CM | POA: Diagnosis not present

## 2017-06-30 ENCOUNTER — Ambulatory Visit (HOSPITAL_COMMUNITY)
Admission: RE | Admit: 2017-06-30 | Discharge: 2017-06-30 | Disposition: A | Discharge status: Home / Self Care | Payer: Medicare Other | Source: Ambulatory Visit | Attending: Pulmonary Disease | Admitting: Pulmonary Disease

## 2017-06-30 ENCOUNTER — Other Ambulatory Visit (HOSPITAL_COMMUNITY): Payer: Self-pay | Admitting: Pulmonary Disease

## 2017-06-30 DIAGNOSIS — M542 Cervicalgia: Secondary | ICD-10-CM

## 2017-06-30 DIAGNOSIS — M47812 Spondylosis without myelopathy or radiculopathy, cervical region: Secondary | ICD-10-CM | POA: Insufficient documentation

## 2017-06-30 DIAGNOSIS — M4802 Spinal stenosis, cervical region: Secondary | ICD-10-CM | POA: Diagnosis not present

## 2017-06-30 DIAGNOSIS — Z23 Encounter for immunization: Secondary | ICD-10-CM | POA: Diagnosis not present

## 2017-06-30 DIAGNOSIS — Z0000 Encounter for general adult medical examination without abnormal findings: Secondary | ICD-10-CM | POA: Diagnosis not present

## 2017-07-05 DIAGNOSIS — Z6828 Body mass index (BMI) 28.0-28.9, adult: Secondary | ICD-10-CM | POA: Diagnosis not present

## 2017-07-05 DIAGNOSIS — M47812 Spondylosis without myelopathy or radiculopathy, cervical region: Secondary | ICD-10-CM | POA: Diagnosis not present

## 2017-07-05 DIAGNOSIS — M47816 Spondylosis without myelopathy or radiculopathy, lumbar region: Secondary | ICD-10-CM | POA: Diagnosis not present

## 2017-09-06 DIAGNOSIS — Z961 Presence of intraocular lens: Secondary | ICD-10-CM | POA: Diagnosis not present

## 2017-09-06 DIAGNOSIS — H2511 Age-related nuclear cataract, right eye: Secondary | ICD-10-CM | POA: Diagnosis not present

## 2017-11-01 DIAGNOSIS — M25511 Pain in right shoulder: Secondary | ICD-10-CM | POA: Diagnosis not present

## 2017-11-01 DIAGNOSIS — M19011 Primary osteoarthritis, right shoulder: Secondary | ICD-10-CM | POA: Diagnosis not present

## 2017-11-02 DIAGNOSIS — M19021 Primary osteoarthritis, right elbow: Secondary | ICD-10-CM | POA: Diagnosis not present

## 2017-11-14 DIAGNOSIS — N200 Calculus of kidney: Secondary | ICD-10-CM | POA: Diagnosis not present

## 2017-11-14 DIAGNOSIS — N281 Cyst of kidney, acquired: Secondary | ICD-10-CM | POA: Diagnosis not present

## 2017-11-14 DIAGNOSIS — R351 Nocturia: Secondary | ICD-10-CM | POA: Diagnosis not present

## 2017-11-14 DIAGNOSIS — N401 Enlarged prostate with lower urinary tract symptoms: Secondary | ICD-10-CM | POA: Diagnosis not present

## 2017-12-08 DIAGNOSIS — L821 Other seborrheic keratosis: Secondary | ICD-10-CM | POA: Diagnosis not present

## 2017-12-08 DIAGNOSIS — Z86018 Personal history of other benign neoplasm: Secondary | ICD-10-CM | POA: Diagnosis not present

## 2017-12-08 DIAGNOSIS — L814 Other melanin hyperpigmentation: Secondary | ICD-10-CM | POA: Diagnosis not present

## 2017-12-08 DIAGNOSIS — L570 Actinic keratosis: Secondary | ICD-10-CM | POA: Diagnosis not present

## 2017-12-08 DIAGNOSIS — Z85828 Personal history of other malignant neoplasm of skin: Secondary | ICD-10-CM | POA: Diagnosis not present

## 2017-12-08 DIAGNOSIS — D1801 Hemangioma of skin and subcutaneous tissue: Secondary | ICD-10-CM | POA: Diagnosis not present

## 2017-12-08 DIAGNOSIS — D225 Melanocytic nevi of trunk: Secondary | ICD-10-CM | POA: Diagnosis not present

## 2017-12-15 DIAGNOSIS — Z6828 Body mass index (BMI) 28.0-28.9, adult: Secondary | ICD-10-CM | POA: Diagnosis not present

## 2017-12-15 DIAGNOSIS — M47812 Spondylosis without myelopathy or radiculopathy, cervical region: Secondary | ICD-10-CM | POA: Diagnosis not present

## 2017-12-15 DIAGNOSIS — M47816 Spondylosis without myelopathy or radiculopathy, lumbar region: Secondary | ICD-10-CM | POA: Diagnosis not present

## 2017-12-22 DIAGNOSIS — E785 Hyperlipidemia, unspecified: Secondary | ICD-10-CM | POA: Diagnosis not present

## 2017-12-22 DIAGNOSIS — I2510 Atherosclerotic heart disease of native coronary artery without angina pectoris: Secondary | ICD-10-CM | POA: Diagnosis not present

## 2017-12-22 DIAGNOSIS — M1990 Unspecified osteoarthritis, unspecified site: Secondary | ICD-10-CM | POA: Diagnosis not present

## 2017-12-22 DIAGNOSIS — I10 Essential (primary) hypertension: Secondary | ICD-10-CM | POA: Diagnosis not present

## 2018-03-27 NOTE — Progress Notes (Signed)
Cardiology Office Note  Date: 03/28/2018   ID: Brandon [name], DOB 1946/01/22, MRN [id number]  PCP: Sinda Du, MD  Primary Cardiologist: Rozann Lesches, MD   Chief Complaint  Patient presents with  . Coronary Artery Disease    History of Present Illness: Brandon [name] is a 72 y.o. male last seen in July 2018.  He is here today with his wife.  Continues to work with his [employer].  States that he really enjoys being outside and does not plan to quit anytime soon.  He does not report any angina or nitroglycerin use.  Reviewed his medications which are outlined below.  We discussed reducing aspirin to 81 mg daily.  He is due for follow-up lipids with Dr. Luan Pulling, reports compliance with Pravachol.  He is 4 years out from CABG. we discussed considering a stress test at 5 years out.  I personally reviewed his ECG today which shows sinus bradycardia with old inferior infarct pattern and nonspecific T wave changes.  Past Medical History:  Diagnosis Date  . Cataract   . Coronary atherosclerosis of native coronary artery    Multivessel status post CABG March 2015  . DDD (degenerative disc disease), lumbar    Dr. Carloyn Manner  . Essential hypertension, benign   . GERD (gastroesophageal reflux disease)   . Hard of hearing   . Hemorrhoids   . History of gout   . History of kidney stones   . History of [facility] spotted fever   . Lumbago   . Mixed hyperlipidemia   . Renal calculus or stone 05/22/14    Past Surgical History:  Procedure Laterality Date  . AMPUTATION Right 03/27/2014   Procedure: DEBRIDEMENT AND CLOSURE RIGHT INDEX FINGER;  Surgeon: [name], MD;  Location: [place];  Service: Orthopedics;  Laterality: Right;  . BACK SURGERY    . BICEPT TENODESIS  11/26/2011   Procedure: BICEPT TENODESIS;  Surgeon: Augustin Schooling, MD;  Location: [place];  Service: Orthopedics;  Laterality: Left;  . Cataract surgery Left   . COLONOSCOPY    . COLONOSCOPY N/A 12/26/2014     Procedure: COLONOSCOPY;  Surgeon: Rogene Houston, MD;  [facility];  Service: Endoscopy;  Laterality: N/A;  730  . CORONARY ARTERY BYPASS GRAFT N/A 10/19/2013   Procedure: CORONARY ARTERY BYPASS GRAFTING (CABG);  Surgeon: Rexene Alberts, MD;  Location: [place];  Service: Open Heart Surgery;  Laterality: N/A;  CABG times four using left internal mammary artery and left leg saphenous vein, incision made on right leg but no vein removed  . CYSTOSCOPY WITH RETROGRADE PYELOGRAM, URETEROSCOPY AND STENT PLACEMENT Left 08/24/2016   Procedure: CYSTOSCOPY WITH LEFT RETROGRADE PYELOGRAM, LEFT URETEROSCOPY, BASKET EXTRACTION LEFT URETERAL STONE;  Surgeon: Irine Seal, MD;  [facility];  Service: Urology;  Laterality: Left;  . INTRAOPERATIVE TRANSESOPHAGEAL ECHOCARDIOGRAM N/A 10/19/2013   Procedure: INTRAOPERATIVE TRANSESOPHAGEAL ECHOCARDIOGRAM;  Surgeon: Rexene Alberts, MD;  Location: [place];  Service: Open Heart Surgery;  Laterality: N/A;  . JOINT REPLACEMENT    . LEFT HEART CATHETERIZATION WITH CORONARY ANGIOGRAM N/A 10/09/2013   Procedure: LEFT HEART CATHETERIZATION WITH CORONARY ANGIOGRAM;  Surgeon: Burnell Blanks, MD;  Location: [facility] CATH LAB;  Service: Cardiovascular;  Laterality: N/A;  . Left shoulder rotator cuff repair    . LITHOTRIPSY    . TOTAL KNEE ARTHROPLASTY Left   . VASECTOMY      Current Outpatient Medications  Medication Sig Dispense Refill  .  acetaminophen (TYLENOL) 500 MG tablet Take 500-1,000 mg by mouth every 6 (six) hours as needed (for pain.).    Marland Kitchen allopurinol (ZYLOPRIM) 300 MG tablet Take 300 mg by mouth daily.    . Ascorbic Acid (VITAMIN C) 1000 MG tablet Take 1,000 mg by mouth daily.    Marland Kitchen aspirin EC 325 MG tablet Take 325 mg by mouth daily.    . Glucosamine-Chondroitin (OSTEO BI-FLEX REGULAR STRENGTH PO) Take 1 tablet by mouth daily.    . hydrochlorothiazide (HYDRODIURIL) 50 MG tablet Take 50 mg by mouth daily.    . meloxicam (MOBIC) 7.5 MG tablet Take 7.5 mg  by mouth at bedtime.    . metoprolol tartrate (LOPRESSOR) 25 MG tablet Take 25 mg by mouth 2 (two) times daily.    . Multiple Vitamin (MULITIVITAMIN WITH MINERALS) TABS Take 1 tablet by mouth daily.    . nitroGLYCERIN (NITROSTAT) 0.4 MG SL tablet Place 1 tablet (0.4 mg total) under the tongue every 5 (five) minutes as needed. 25 tablet 3  . Omega-3 Fatty Acids (FISH OIL) 1200 MG CAPS Take 1,200 mg by mouth daily.     . potassium chloride (K-DUR,KLOR-CON) 10 MEQ tablet Take 10 mEq by mouth daily.    . pravastatin (PRAVACHOL) 40 MG tablet Take 40 mg by mouth every evening.     . ranitidine (ZANTAC) 150 MG tablet Take 150 mg by mouth daily.    . tamsulosin (FLOMAX) 0.4 MG CAPS capsule Take 0.4 mg by mouth daily.     No current facility-administered medications for this visit.    Allergies:  Patient has no known allergies.   Social History: The patient  reports that he quit smoking about 34 years ago. His smoking use included cigarettes. He started smoking about 59 years ago. He has a 60.00 pack-year smoking history. He has quit using smokeless tobacco.  His smokeless tobacco use included chew. He reports that he drinks alcohol. He reports that he does not use drugs.   ROS:  Please see the history of present illness. Otherwise, complete review of systems is positive for hearing loss.  All other systems are reviewed and negative.   Physical Exam: VS:  BP 138/86   Pulse (!) 54   Ht 6\' 1"  (1.854 m)   Wt 217 lb (98.4 kg)   SpO2 96% Comment: on room air  BMI 28.63 kg/m , BMI Body mass index is 28.63 kg/m.  Wt Readings from Last 3 Encounters:  03/28/18 217 lb (98.4 kg)  03/14/17 214 lb (97.1 kg)  08/24/16 212 lb (96.2 kg)    General: Patient appears comfortable at rest. HEENT: Conjunctiva and lids normal, oropharynx clear. Neck: Supple, no elevated JVP or carotid bruits, no thyromegaly. Lungs: Clear to auscultation, nonlabored breathing at rest. Cardiac: Regular rate and rhythm, no S3 or  significant systolic murmur, no pericardial rub. Abdomen: Soft, nontender, bowel sounds present. Extremities: No pitting edema, distal pulses 2+. Skin: Warm and dry. Musculoskeletal: No kyphosis. Neuropsychiatric: Alert and oriented x3, affect grossly appropriate.  ECG: I personally reviewed the tracing from 03/14/2017 which showed sinus bradycardia with nonspecific ST-T changes and probable old inferior infarct pattern.  Recent Labwork:    Component Value Date/Time   CHOL 141 12/06/2013 0757   TRIG 92 12/06/2013 0757   HDL 58 12/06/2013 0757   CHOLHDL 2.4 12/06/2013 0757   VLDL 18 12/06/2013 0757   LDLCALC 65 12/06/2013 0757  January 2018: Hemoglobin 15.2, platelets 241, potassium 4.0, BUN 18, creatinine 0.87  Other Studies Reviewed Today:  Echocardiogram 09/19/2013: Study Conclusions  - Left ventricle: Normal left ventricular outflow tract gradient. Wall thickness was increased in a pattern of severe LVH. Systolic function was normal. The estimated ejection fraction was in the range of 60% to 65%. Wall motion was normal; there were no regional wall motion abnormalities. There was an increased relative contribution of atrial contraction to ventricular filling. Doppler parameters are consistent with abnormal left ventricular relaxation (grade 1 diastolic dysfunction). There was no evidence of elevated ventricular filling pressure by Doppler parameters. - Aortic valve: Trileaflet; mildly thickened leaflets. There was no stenosis. - Mitral valve: No systolic motion of the anterior mitral leaflet was noted. A small torn chord was appreciated, with no evidence of mitral regurgitation. Calcified annulus. - Left atrium: The atrium was mildly dilated.  Assessment and Plan:  1.  Symptomatically stable, multivessel CAD status post CABG in March 2015.  ECG reviewed.  Plan to continue present medical regimen and observation at this point.  2.  Mixed  hyperlipidemia, he continues on Pravachol.  His wife plans to bring by pending lab work once completed by Dr. Luan Pulling.  3.  Essential hypertension, no changes made to present medications.  Keep follow-up with Dr. Luan Pulling.   Current medicines were reviewed with the patient today.   Orders Placed This Encounter  Procedures  . EKG 12-Lead    Disposition: Follow-up in 1 year.   Signed, Satira Sark, MD, [facility] 03/28/2018 8:51 AM    [place] at [facility] [address], [place], [place] [address] Phone: [phone]; Fax: [phone]

## 2018-03-28 ENCOUNTER — Encounter: Payer: Self-pay | Admitting: Cardiology

## 2018-03-28 ENCOUNTER — Ambulatory Visit (INDEPENDENT_AMBULATORY_CARE_PROVIDER_SITE_OTHER): Payer: Medicare Other | Admitting: Cardiology

## 2018-03-28 VITALS — BP 138/86 | HR 54 | Ht 73.0 in | Wt 217.0 lb

## 2018-03-28 DIAGNOSIS — E782 Mixed hyperlipidemia: Secondary | ICD-10-CM

## 2018-03-28 DIAGNOSIS — I10 Essential (primary) hypertension: Secondary | ICD-10-CM

## 2018-03-28 DIAGNOSIS — I25119 Atherosclerotic heart disease of native coronary artery with unspecified angina pectoris: Secondary | ICD-10-CM | POA: Diagnosis not present

## 2018-03-28 MED ORDER — ASPIRIN EC 81 MG PO TBEC: 81.0000 mg | DELAYED_RELEASE_TABLET | Freq: Every day | ORAL | 3 refills | Status: DC

## 2018-03-28 NOTE — Patient Instructions (Addendum)
Your physician wants you to follow-up in:  with Dr.McDowell You will receive a reminder letter in the mail two months in advance. If you don't receive a letter, please call our office to schedule the follow-up appointment.    DECREASE Aspirin to 81 mg daily  If you need a refill on your cardiac medications before your next appointment, please call your pharmacy.    No labs or tests ordered today.       Thank you for choosing [place] !

## 2018-04-11 ENCOUNTER — Other Ambulatory Visit (HOSPITAL_COMMUNITY): Payer: Self-pay | Admitting: Pulmonary Disease

## 2018-04-11 DIAGNOSIS — I2510 Atherosclerotic heart disease of native coronary artery without angina pectoris: Secondary | ICD-10-CM | POA: Diagnosis not present

## 2018-04-11 DIAGNOSIS — E785 Hyperlipidemia, unspecified: Secondary | ICD-10-CM | POA: Diagnosis not present

## 2018-04-11 DIAGNOSIS — I119 Hypertensive heart disease without heart failure: Secondary | ICD-10-CM | POA: Diagnosis not present

## 2018-04-11 DIAGNOSIS — R109 Unspecified abdominal pain: Secondary | ICD-10-CM | POA: Diagnosis not present

## 2018-04-12 ENCOUNTER — Ambulatory Visit (HOSPITAL_COMMUNITY)
Admission: RE | Admit: 2018-04-12 | Discharge: 2018-04-12 | Disposition: A | Discharge status: Home / Self Care | Payer: Medicare Other | Source: Ambulatory Visit | Attending: Pulmonary Disease | Admitting: Pulmonary Disease

## 2018-04-12 DIAGNOSIS — R1013 Epigastric pain: Secondary | ICD-10-CM | POA: Diagnosis not present

## 2018-04-12 DIAGNOSIS — N200 Calculus of kidney: Secondary | ICD-10-CM | POA: Insufficient documentation

## 2018-04-12 DIAGNOSIS — R109 Unspecified abdominal pain: Secondary | ICD-10-CM | POA: Diagnosis present

## 2018-04-12 DIAGNOSIS — R110 Nausea: Secondary | ICD-10-CM | POA: Diagnosis not present

## 2018-04-12 DIAGNOSIS — I700 Atherosclerosis of aorta: Secondary | ICD-10-CM | POA: Insufficient documentation

## 2018-04-12 DIAGNOSIS — I70203 Unspecified atherosclerosis of native arteries of extremities, bilateral legs: Secondary | ICD-10-CM | POA: Insufficient documentation

## 2018-04-12 LAB — POCT I-STAT CREATININE: Creatinine, Ser: 1 mg/dL (ref 0.61–1.24)

## 2018-04-12 MED ORDER — IOPAMIDOL (ISOVUE-300) INJECTION 61%
100.0000 mL | Freq: Once | INTRAVENOUS | Status: AC | PRN
  Administered 2018-04-12: 100 mL via INTRAVENOUS

## 2018-04-13 ENCOUNTER — Ambulatory Visit (HOSPITAL_COMMUNITY): Payer: Medicare Other

## 2018-05-01 ENCOUNTER — Other Ambulatory Visit (HOSPITAL_COMMUNITY): Payer: Self-pay | Admitting: Pulmonary Disease

## 2018-05-01 DIAGNOSIS — R1011 Right upper quadrant pain: Secondary | ICD-10-CM

## 2018-05-03 ENCOUNTER — Other Ambulatory Visit (HOSPITAL_COMMUNITY): Payer: Medicare Other

## 2018-05-03 DIAGNOSIS — M19021 Primary osteoarthritis, right elbow: Secondary | ICD-10-CM | POA: Diagnosis not present

## 2018-05-08 ENCOUNTER — Encounter (HOSPITAL_COMMUNITY): Payer: Self-pay

## 2018-05-08 ENCOUNTER — Ambulatory Visit (HOSPITAL_COMMUNITY)
Admission: RE | Admit: 2018-05-08 | Discharge: 2018-05-08 | Disposition: A | Discharge status: Home / Self Care | Payer: Medicare Other | Source: Ambulatory Visit | Attending: Pulmonary Disease | Admitting: Pulmonary Disease

## 2018-05-08 DIAGNOSIS — R109 Unspecified abdominal pain: Secondary | ICD-10-CM | POA: Diagnosis not present

## 2018-05-08 DIAGNOSIS — R1011 Right upper quadrant pain: Secondary | ICD-10-CM | POA: Insufficient documentation

## 2018-05-08 DIAGNOSIS — R112 Nausea with vomiting, unspecified: Secondary | ICD-10-CM | POA: Diagnosis not present

## 2018-05-08 MED ORDER — TECHNETIUM TC 99M MEBROFENIN IV KIT
5.0000 | PACK | Freq: Once | INTRAVENOUS | Status: AC | PRN
  Administered 2018-05-08: 5.12 via INTRAVENOUS

## 2018-06-05 ENCOUNTER — Encounter (INDEPENDENT_AMBULATORY_CARE_PROVIDER_SITE_OTHER): Payer: Self-pay | Admitting: Internal Medicine

## 2018-06-05 ENCOUNTER — Ambulatory Visit (INDEPENDENT_AMBULATORY_CARE_PROVIDER_SITE_OTHER): Payer: Medicare Other | Admitting: Internal Medicine

## 2018-06-05 ENCOUNTER — Encounter (INDEPENDENT_AMBULATORY_CARE_PROVIDER_SITE_OTHER): Payer: Self-pay | Admitting: *Deleted

## 2018-06-05 VITALS — BP 134/80 | HR 68 | Temp 98.1°F | Ht 73.0 in | Wt 212.3 lb

## 2018-06-05 DIAGNOSIS — R110 Nausea: Secondary | ICD-10-CM | POA: Insufficient documentation

## 2018-06-05 DIAGNOSIS — I25119 Atherosclerotic heart disease of native coronary artery with unspecified angina pectoris: Secondary | ICD-10-CM

## 2018-06-05 NOTE — Progress Notes (Signed)
Subjective:    Patient ID: Brandon [name], male    DOB: 12-11-1945, 72 y.o.   MRN: [id number]  HPI Referred by Dr. Luan Pulling for abdominal pain, nausea, bloating. He had eaten beef and his symptoms started.  CT scan 04/12/2018 (epigastric pain, swelling since 04/08/2018. Hx of kidney stones, GERD, hypertension, CAD, post CABG).  IMPRESSION: 1. No explanation for patient's epigastric abdominal pain and nausea. Specifically, no evidence of enteric or urinary obstruction. 2. Bilateral nonobstructing nephrolithiasis - overall stone burden appears similar to the 07/2016 examination. 3.  Aortic Atherosclerosis (ICD10-I70.0). 4. Potential hemodynamically significant stenoses involving the bilateral common femoral arteries, right greater than left, incompletely evaluated. Correlation for symptoms of PAD is advised. Further evaluation with the acquisition of ABIs could be performed as indicated. 05/08/2018 HIDA scan, abdominal pain following ingestion of red meat. N and V. Evaluate for biliary dyskinesia.  IMPRESSION: No scintigraphic evidence of acute cholecystitis or biliary dyskinesia. Normal gallbladder ejection fraction.  93/2019 Alpha Gal Panel  Galactose Ige positive.  Saw Dr. Luan Pulling 04/18/2018 for abdominal pain. Has had multiple ticks bites over the summer. Has not eaten red meat 04/17/2018. He continues to have nausea which is better. The nausea comes and goes. The nausea usually occurs after meals but sometimes before. His appetite is okay.  He has lost about 8 pounds.  No GERD. He has a BM every day. No melena or BRRB. Sometimes has loose stools.   His nausea has improved. Has not eaten any beef since September.    Last colonoscopy in 12/2014: Examination performed to cecum. 2 diverticula at hepatic flexure. 5 mm polyp cold snared from proximal sigmoid colon. 7 mm polyp cold snared from rectosigmoid junction. Residual polyp quadrant with snare tip. Both polyps were submitted  together. Small internal hemorrhoids.  Both polyps are tubular adenomas.   Hx of CABG.  13   Previous hx of RMSF.   04/18/2018 RMSF titer not detected., Lyme disease negative.     Review of Systems Past Medical History:  Diagnosis Date  . Cataract   . Coronary atherosclerosis of native coronary artery    Multivessel status post CABG March 2015  . DDD (degenerative disc disease), lumbar    Dr. Carloyn Manner  . Essential hypertension, benign   . GERD (gastroesophageal reflux disease)   . Hard of hearing   . Hemorrhoids   . History of gout   . History of kidney stones   . History of [facility] spotted fever   . Lumbago   . Mixed hyperlipidemia   . Renal calculus or stone 05/22/14    Past Surgical History:  Procedure Laterality Date  . AMPUTATION Right 03/27/2014   Procedure: DEBRIDEMENT AND CLOSURE RIGHT INDEX FINGER;  Surgeon: [name], MD;  Location: [place];  Service: Orthopedics;  Laterality: Right;  . BACK SURGERY    . BICEPT TENODESIS  11/26/2011   Procedure: BICEPT TENODESIS;  Surgeon: Augustin Schooling, MD;  Location: [place];  Service: Orthopedics;  Laterality: Left;  . Cataract surgery Left   . COLONOSCOPY    . COLONOSCOPY N/A 12/26/2014   Procedure: COLONOSCOPY;  Surgeon: Rogene Houston, MD;  [facility];  Service: Endoscopy;  Laterality: N/A;  730  . CORONARY ARTERY BYPASS GRAFT N/A 10/19/2013   Procedure: CORONARY ARTERY BYPASS GRAFTING (CABG);  Surgeon: Rexene Alberts, MD;  Location: [place];  Service: Open Heart Surgery;  Laterality: N/A;  CABG times four using left internal mammary artery and  left leg saphenous vein, incision made on right leg but no vein removed  . CYSTOSCOPY WITH RETROGRADE PYELOGRAM, URETEROSCOPY AND STENT PLACEMENT Left 08/24/2016   Procedure: CYSTOSCOPY WITH LEFT RETROGRADE PYELOGRAM, LEFT URETEROSCOPY, BASKET EXTRACTION LEFT URETERAL STONE;  Surgeon: Irine Seal, MD;  [facility];  Service: Urology;  Laterality: Left;  .  INTRAOPERATIVE TRANSESOPHAGEAL ECHOCARDIOGRAM N/A 10/19/2013   Procedure: INTRAOPERATIVE TRANSESOPHAGEAL ECHOCARDIOGRAM;  Surgeon: Rexene Alberts, MD;  Location: [place];  Service: Open Heart Surgery;  Laterality: N/A;  . JOINT REPLACEMENT    . LEFT HEART CATHETERIZATION WITH CORONARY ANGIOGRAM N/A 10/09/2013   Procedure: LEFT HEART CATHETERIZATION WITH CORONARY ANGIOGRAM;  Surgeon: Burnell Blanks, MD;  Location: [facility] CATH LAB;  Service: Cardiovascular;  Laterality: N/A;  . Left shoulder rotator cuff repair    . LITHOTRIPSY    . TOTAL KNEE ARTHROPLASTY Left   . VASECTOMY      No Known Allergies  Current Outpatient Medications on File Prior to Visit  Medication Sig Dispense Refill  . acetaminophen (TYLENOL) 500 MG tablet Take 500-1,000 mg by mouth every 6 (six) hours as needed (for pain.).    Marland Kitchen allopurinol (ZYLOPRIM) 300 MG tablet Take 300 mg by mouth daily.    . Ascorbic Acid (VITAMIN C) 1000 MG tablet Take 1,000 mg by mouth daily.    Marland Kitchen aspirin EC 81 MG tablet Take 1 tablet (81 mg total) by mouth daily. 90 tablet 3  . Glucosamine-Chondroitin (OSTEO BI-FLEX REGULAR STRENGTH PO) Take 1 tablet by mouth daily.    . hydrochlorothiazide (HYDRODIURIL) 50 MG tablet Take 50 mg by mouth daily.    . meloxicam (MOBIC) 7.5 MG tablet Take 7.5 mg by mouth at bedtime.    . metoprolol tartrate (LOPRESSOR) 25 MG tablet Take 25 mg by mouth at bedtime.     . Misc Natural Products (OSTEO BI-FLEX JOINT SHIELD) TABS Take by mouth.    . Multiple Vitamin (MULITIVITAMIN WITH MINERALS) TABS Take 1 tablet by mouth daily.    . nitroGLYCERIN (NITROSTAT) 0.4 MG SL tablet Place 1 tablet (0.4 mg total) under the tongue every 5 (five) minutes as needed. 25 tablet 3  . potassium chloride (K-DUR,KLOR-CON) 10 MEQ tablet Take 10 mEq by mouth daily.    . pravastatin (PRAVACHOL) 40 MG tablet Take 40 mg by mouth every evening.     . ranitidine (ZANTAC) 150 MG tablet Take 150 mg by mouth daily.    . tamsulosin (FLOMAX) 0.4 MG  CAPS capsule Take 0.4 mg by mouth daily.     No current facility-administered medications on file prior to visit.         Objective:   Physical Exam Blood pressure 134/80, pulse 68, temperature 98.1 F (36.7 C), height 6\' 1"  (1.854 m), weight 212 lb 4.8 oz (96.3 kg).  Alert and oriented. Skin warm and dry. Oral mucosa is moist.   . Sclera anicteric, conjunctivae is pink. Thyroid not enlarged. No cervical lymphadenopathy. Lungs clear. Heart regular rate and rhythm.  Abdomen is soft. Bowel sounds are positive. No hepatomegaly. No abdominal masses felt. No tenderness.  No edema to lower extremities.          Assessment & Plan:  Nausea. EGD. GERD needs to be ruled. The risks of bleeding, perforation and infection were reviewed with patient.

## 2018-06-05 NOTE — Patient Instructions (Signed)
The risks of bleeding, perforation and infection were reviewed with patient.  

## 2018-06-26 DIAGNOSIS — Z0000 Encounter for general adult medical examination without abnormal findings: Secondary | ICD-10-CM | POA: Diagnosis not present

## 2018-06-26 DIAGNOSIS — Z23 Encounter for immunization: Secondary | ICD-10-CM | POA: Diagnosis not present

## 2018-06-27 DIAGNOSIS — Z125 Encounter for screening for malignant neoplasm of prostate: Secondary | ICD-10-CM | POA: Diagnosis not present

## 2018-06-27 DIAGNOSIS — I10 Essential (primary) hypertension: Secondary | ICD-10-CM | POA: Diagnosis not present

## 2018-06-27 DIAGNOSIS — M722 Plantar fascial fibromatosis: Secondary | ICD-10-CM | POA: Diagnosis not present

## 2018-06-27 DIAGNOSIS — I2510 Atherosclerotic heart disease of native coronary artery without angina pectoris: Secondary | ICD-10-CM | POA: Diagnosis not present

## 2018-06-27 DIAGNOSIS — Z0000 Encounter for general adult medical examination without abnormal findings: Secondary | ICD-10-CM | POA: Diagnosis not present

## 2018-06-27 DIAGNOSIS — Z8739 Personal history of other diseases of the musculoskeletal system and connective tissue: Secondary | ICD-10-CM | POA: Diagnosis not present

## 2018-06-27 DIAGNOSIS — I119 Hypertensive heart disease without heart failure: Secondary | ICD-10-CM | POA: Diagnosis not present

## 2018-06-27 DIAGNOSIS — M1990 Unspecified osteoarthritis, unspecified site: Secondary | ICD-10-CM | POA: Diagnosis not present

## 2018-06-27 DIAGNOSIS — E785 Hyperlipidemia, unspecified: Secondary | ICD-10-CM | POA: Diagnosis not present

## 2018-08-03 ENCOUNTER — Encounter (HOSPITAL_COMMUNITY): Payer: Self-pay | Admitting: *Deleted

## 2018-08-03 ENCOUNTER — Encounter (HOSPITAL_COMMUNITY)
Admission: RE | Disposition: A | Discharge status: Home / Self Care | Payer: Self-pay | Source: Home / Self Care | Attending: Internal Medicine

## 2018-08-03 ENCOUNTER — Other Ambulatory Visit: Payer: Self-pay

## 2018-08-03 ENCOUNTER — Ambulatory Visit (HOSPITAL_COMMUNITY)
Admission: RE | Admit: 2018-08-03 | Discharge: 2018-08-03 | Disposition: A | Discharge status: Home / Self Care | Payer: Medicare Other | Attending: Internal Medicine | Admitting: Internal Medicine

## 2018-08-03 DIAGNOSIS — M109 Gout, unspecified: Secondary | ICD-10-CM | POA: Diagnosis not present

## 2018-08-03 DIAGNOSIS — Z87891 Personal history of nicotine dependence: Secondary | ICD-10-CM | POA: Insufficient documentation

## 2018-08-03 DIAGNOSIS — Z7982 Long term (current) use of aspirin: Secondary | ICD-10-CM | POA: Diagnosis not present

## 2018-08-03 DIAGNOSIS — Z951 Presence of aortocoronary bypass graft: Secondary | ICD-10-CM | POA: Diagnosis not present

## 2018-08-03 DIAGNOSIS — R110 Nausea: Secondary | ICD-10-CM | POA: Insufficient documentation

## 2018-08-03 DIAGNOSIS — Z79899 Other long term (current) drug therapy: Secondary | ICD-10-CM | POA: Diagnosis not present

## 2018-08-03 DIAGNOSIS — K210 Gastro-esophageal reflux disease with esophagitis: Secondary | ICD-10-CM | POA: Insufficient documentation

## 2018-08-03 DIAGNOSIS — K3189 Other diseases of stomach and duodenum: Secondary | ICD-10-CM | POA: Insufficient documentation

## 2018-08-03 DIAGNOSIS — R1013 Epigastric pain: Secondary | ICD-10-CM | POA: Diagnosis not present

## 2018-08-03 DIAGNOSIS — I2510 Atherosclerotic heart disease of native coronary artery without angina pectoris: Secondary | ICD-10-CM | POA: Insufficient documentation

## 2018-08-03 DIAGNOSIS — M5136 Other intervertebral disc degeneration, lumbar region: Secondary | ICD-10-CM | POA: Diagnosis not present

## 2018-08-03 DIAGNOSIS — E782 Mixed hyperlipidemia: Secondary | ICD-10-CM | POA: Diagnosis not present

## 2018-08-03 DIAGNOSIS — K228 Other specified diseases of esophagus: Secondary | ICD-10-CM | POA: Insufficient documentation

## 2018-08-03 DIAGNOSIS — I10 Essential (primary) hypertension: Secondary | ICD-10-CM | POA: Insufficient documentation

## 2018-08-03 DIAGNOSIS — Z791 Long term (current) use of non-steroidal anti-inflammatories (NSAID): Secondary | ICD-10-CM | POA: Diagnosis not present

## 2018-08-03 DIAGNOSIS — K449 Diaphragmatic hernia without obstruction or gangrene: Secondary | ICD-10-CM | POA: Diagnosis not present

## 2018-08-03 DIAGNOSIS — Z87442 Personal history of urinary calculi: Secondary | ICD-10-CM | POA: Insufficient documentation

## 2018-08-03 HISTORY — PX: BIOPSY: SHX5522

## 2018-08-03 HISTORY — PX: ESOPHAGOGASTRODUODENOSCOPY: SHX5428

## 2018-08-03 SURGERY — EGD (ESOPHAGOGASTRODUODENOSCOPY)
Anesthesia: Moderate Sedation

## 2018-08-03 MED ORDER — MIDAZOLAM HCL 5 MG/5ML IJ SOLN
INTRAMUSCULAR | Status: AC
  Filled 2018-08-03: qty 10

## 2018-08-03 MED ORDER — LIDOCAINE VISCOUS HCL 2 % MT SOLN
OROMUCOSAL | Status: DC | PRN
  Administered 2018-08-03: 4 mL via OROMUCOSAL

## 2018-08-03 MED ORDER — MIDAZOLAM HCL 5 MG/5ML IJ SOLN
INTRAMUSCULAR | Status: DC | PRN
  Administered 2018-08-03 (×2): 1 mg via INTRAVENOUS
  Administered 2018-08-03: 2 mg via INTRAVENOUS

## 2018-08-03 MED ORDER — SODIUM CHLORIDE 0.9 % IV SOLN
INTRAVENOUS | Status: DC
  Administered 2018-08-03: 1000 mL via INTRAVENOUS

## 2018-08-03 MED ORDER — MEPERIDINE HCL 50 MG/ML IJ SOLN
INTRAMUSCULAR | Status: DC | PRN
  Administered 2018-08-03 (×2): 25 mg via INTRAVENOUS

## 2018-08-03 MED ORDER — MEPERIDINE HCL 50 MG/ML IJ SOLN
INTRAMUSCULAR | Status: AC
  Filled 2018-08-03: qty 1

## 2018-08-03 MED ORDER — ESOMEPRAZOLE MAGNESIUM 40 MG PO CPDR: 40.0000 mg | DELAYED_RELEASE_CAPSULE | Freq: Every day | ORAL | 5 refills | Status: DC

## 2018-08-03 MED ORDER — LIDOCAINE VISCOUS HCL 2 % MT SOLN
OROMUCOSAL | Status: AC
  Filled 2018-08-03: qty 15

## 2018-08-03 MED ORDER — STERILE WATER FOR IRRIGATION IR SOLN
Status: DC | PRN
  Administered 2018-08-03: 100 mL

## 2018-08-03 NOTE — Discharge Instructions (Signed)
Resume aspirin on 08/04/2018. If possible take meloxicam on as-needed basis and take it after meals and not at bedtime. Esomeprazole milligrams by mouth 30 minutes before breakfast daily. Famotidine OTC 20 mg by mouth daily at bedtime. Resume other medications as before. Resume usual diet. No driving for 24 hours. Physician will call with biopsy results.  Gastrointestinal Endoscopy, Care After Refer to this sheet in the next few weeks. These instructions provide you with information about caring for yourself after your procedure. Your health care provider may also give you more specific instructions. Your treatment has been planned according to current medical practices, but problems sometimes occur. Call your health care provider if you have any problems or questions after your procedure. What can I expect after the procedure? After your procedure, it is common to feel:  Bloated.  Soreness in your throat.  Sleepy.  Follow these instructions at home:  Do not drive for 24 hours if you received a if you received a medicine to help you relax (sedative).  Avoid drinking warm beverages and alcohol for the first 24 hours after the procedure.  Take over-the-counter and prescription medicines only as told by your health care provider.  Drink enough fluids to keep your urine clear or pale yellow.  If you feel bloated, try going for a walk. Walking may help the feeling go away.  If your throat is sore, try gargling with salt water. Get help right away if:  You have severe nausea or vomiting.  You have severe abdominal pain, abdominal cramps that last longer than 6 hours, or abdominal swelling.  You have severe shoulder or back pain.  You have trouble swallowing.  You have shortness of breath, your breathing is shallow, or you breathing is faster than normal.  You have a fever.  Your heart is beating very fast.  You vomit blood or material that looks like coffee grounds.  You  have bloody, black, or tarry stools. This information is not intended to replace advice given to you by your health care provider. Make sure you discuss any questions you have with your health care provider.   Gastroesophageal Reflux Disease, Adult Gastroesophageal reflux (GER) happens when acid from the stomach flows up into the tube that connects the mouth and the stomach (esophagus). Normally, food travels down the esophagus and stays in the stomach to be digested. However, when a person has GER, food and stomach acid sometimes move back up into the esophagus. If this becomes a more serious problem, the person may be diagnosed with a disease called gastroesophageal reflux disease (GERD). GERD occurs when the reflux:  Happens often.  Causes frequent or severe symptoms.  Causes problems such as damage to the esophagus. When stomach acid comes in contact with the esophagus, the acid may cause soreness (inflammation) in the esophagus. Over time, GERD may create small holes (ulcers) in the lining of the esophagus. What are the causes? This condition is caused by a problem with the muscle between the esophagus and the stomach (lower esophageal sphincter, or LES). Normally, the LES muscle closes after food passes through the esophagus to the stomach. When the LES is weakened or abnormal, it does not close properly, and that allows food and stomach acid to go back up into the esophagus. The LES can be weakened by certain dietary substances, medicines, and medical conditions, including:  Tobacco use.  Pregnancy.  Having a hiatal hernia.  Alcohol use.  Certain foods and beverages, such as coffee, chocolate, onions,  and peppermint. What increases the risk? You are more likely to develop this condition if you:  Have an increased body weight.  Have a connective tissue disorder.  Use NSAID medicines. What are the signs or symptoms? Symptoms of this condition  include:  Heartburn.  Difficult or painful swallowing.  The feeling of having a lump in the throat.  Abitter taste in the mouth.  Bad breath.  Having a large amount of saliva.  Having an upset or bloated stomach.  Belching.  Chest pain. Different conditions can cause chest pain. Make sure you see your health care provider if you experience chest pain.  Shortness of breath or wheezing.  Ongoing (chronic) cough or a night-time cough.  Wearing away of tooth enamel.  Weight loss. How is this diagnosed? Your health care provider will take a medical history and perform a physical exam. To determine if you have mild or severe GERD, your health care provider may also monitor how you respond to treatment. You may also have tests, including:  A test to examine your stomach and esophagus with a small camera (endoscopy).  A test thatmeasures the acidity level in your esophagus.  A test thatmeasures how much pressure is on your esophagus.  A barium swallow or modified barium swallow test to show the shape, size, and functioning of your esophagus. How is this treated? The goal of treatment is to help relieve your symptoms and to prevent complications. Treatment for this condition may vary depending on how severe your symptoms are. Your health care provider may recommend:  Changes to your diet.  Medicine.  Surgery. Follow these instructions at home: Eating and drinking   Follow a diet as recommended by your health care provider. This may involve avoiding foods and drinks such as: ? Coffee and tea (with or without caffeine). ? Drinks that containalcohol. ? Energy drinks and sports drinks. ? Carbonated drinks or sodas. ? Chocolate and cocoa. ? Peppermint and mint flavorings. ? Garlic and onions. ? Horseradish. ? Spicy and acidic foods, including peppers, chili powder, curry powder, vinegar, hot sauces, and barbecue sauce. ? Citrus fruit juices and citrus fruits, such as  oranges, lemons, and limes. ? Tomato-based foods, such as red sauce, chili, salsa, and pizza with red sauce. ? Fried and fatty foods, such as donuts, french fries, potato chips, and high-fat dressings. ? High-fat meats, such as hot dogs and fatty cuts of red and white meats, such as rib eye steak, sausage, ham, and bacon. ? High-fat dairy items, such as whole milk, butter, and cream cheese.  Eat small, frequent meals instead of large meals.  Avoid drinking large amounts of liquid with your meals.  Avoid eating meals during the 2-3 hours before bedtime.  Avoid lying down right after you eat.  Do not exercise right after you eat. Lifestyle   Do not use any products that contain nicotine or tobacco, such as cigarettes, e-cigarettes, and chewing tobacco. If you need help quitting, ask your health care provider.  Try to reduce your stress by using methods such as yoga or meditation. If you need help reducing stress, ask your health care provider.  If you are overweight, reduce your weight to an amount that is healthy for you. Ask your health care provider for guidance about a safe weight loss goal. General instructions  Pay attention to any changes in your symptoms.  Take over-the-counter and prescription medicines only as told by your health care provider. Do not take aspirin, ibuprofen, or other  NSAIDs unless your health care provider told you to do so.  Wear loose-fitting clothing. Do not wear anything tight around your waist that causes pressure on your abdomen.  Raise (elevate) the head of your bed about 6 inches (15 cm).  Avoid bending over if this makes your symptoms worse.  Keep all follow-up visits as told by your health care provider. This is important. Contact a health care provider if:  You have: ? New symptoms. ? Unexplained weight loss. ? Difficulty swallowing or it hurts to swallow. ? Wheezing or a persistent cough. ? A hoarse voice.  Your symptoms do not  improve with treatment. Get help right away if you:  Have pain in your arms, neck, jaw, teeth, or back.  Feel sweaty, dizzy, or light-headed.  Have chest pain or shortness of breath.  Vomit and your vomit looks like blood or coffee grounds.  Faint.  Have stool that is bloody or black.  Cannot swallow, drink, or eat. Summary  Gastroesophageal reflux happens when acid from the stomach flows up into the esophagus. GERD is a disease in which the reflux happens often, causes frequent or severe symptoms, or causes problems such as damage to the esophagus.  Treatment for this condition may vary depending on how severe your symptoms are. Your health care provider may recommend diet and lifestyle changes, medicine, or surgery.  Contact a health care provider if you have new or worsening symptoms.  Take over-the-counter and prescription medicines only as told by your health care provider. Do not take aspirin, ibuprofen, or other NSAIDs unless your health care provider told you to do so.  Keep all follow-up visits as told by your health care provider. This is important. This information is not intended to replace advice given to you by your health care provider. Make sure you discuss any questions you have with your health care provider.

## 2018-08-03 NOTE — H&P (Signed)
Brandon [name] is an 72 y.o. male.   Chief Complaint: Patient is here for esophagogastroduodenoscopy. HPI: Patient is 72 year old Caucasian male who presents with 81-month history of epigastric pain nausea and bloating.  He said symptoms started after he ate meat at a restaurant.  He was checked for [facility] tick allergy and the test was positive.  He does not eat meat anymore.  He is slowly feeling better but not back to his baseline.  But he had acute symptoms he lost about 12 pounds.  He may have gained 2 pounds back.  He underwent abdominal pelvic CT in August and HIDA scan in September 2019 and no abnormality noted to account for his symptoms. He has not had any vomiting melena or rectal bleeding.  He is on low-dose aspirin.  Last dose was 3 days ago.  He is also on meloxicam at bedtime and he has not taken the last 3 days.  Past Medical History:  Diagnosis Date  . Cataract   . Coronary atherosclerosis of native coronary artery    Multivessel status post CABG March 2015  . DDD (degenerative disc disease), lumbar    Dr. Carloyn Manner  . Essential hypertension, benign   . GERD (gastroesophageal reflux disease)   . Hard of hearing   . Hemorrhoids   . History of gout   . History of kidney stones   . History of [facility] spotted fever   . Lumbago   . Mixed hyperlipidemia   . Renal calculus or stone 05/22/14    Past Surgical History:  Procedure Laterality Date  . AMPUTATION Right 03/27/2014   Procedure: DEBRIDEMENT AND CLOSURE RIGHT INDEX FINGER;  Surgeon: [name], MD;  Location: [place];  Service: Orthopedics;  Laterality: Right;  . BACK SURGERY    . BICEPT TENODESIS  11/26/2011   Procedure: BICEPT TENODESIS;  Surgeon: Augustin Schooling, MD;  Location: [place];  Service: Orthopedics;  Laterality: Left;  . Cataract surgery Left   . COLONOSCOPY    . COLONOSCOPY N/A 12/26/2014   Procedure: COLONOSCOPY;  Surgeon: Rogene Houston, MD;  [facility];  Service: Endoscopy;  Laterality:  N/A;  730  . CORONARY ARTERY BYPASS GRAFT N/A 10/19/2013   Procedure: CORONARY ARTERY BYPASS GRAFTING (CABG);  Surgeon: Rexene Alberts, MD;  Location: [place];  Service: Open Heart Surgery;  Laterality: N/A;  CABG times four using left internal mammary artery and left leg saphenous vein, incision made on right leg but no vein removed  . CYSTOSCOPY WITH RETROGRADE PYELOGRAM, URETEROSCOPY AND STENT PLACEMENT Left 08/24/2016   Procedure: CYSTOSCOPY WITH LEFT RETROGRADE PYELOGRAM, LEFT URETEROSCOPY, BASKET EXTRACTION LEFT URETERAL STONE;  Surgeon: Irine Seal, MD;  [facility];  Service: Urology;  Laterality: Left;  . INTRAOPERATIVE TRANSESOPHAGEAL ECHOCARDIOGRAM N/A 10/19/2013   Procedure: INTRAOPERATIVE TRANSESOPHAGEAL ECHOCARDIOGRAM;  Surgeon: Rexene Alberts, MD;  Location: [place];  Service: Open Heart Surgery;  Laterality: N/A;  . JOINT REPLACEMENT    . LEFT HEART CATHETERIZATION WITH CORONARY ANGIOGRAM N/A 10/09/2013   Procedure: LEFT HEART CATHETERIZATION WITH CORONARY ANGIOGRAM;  Surgeon: Burnell Blanks, MD;  Location: [facility] CATH LAB;  Service: Cardiovascular;  Laterality: N/A;  . Left shoulder rotator cuff repair    . LITHOTRIPSY    . TOTAL KNEE ARTHROPLASTY Left   . VASECTOMY      Family History  Problem Relation Age of Onset  . Lung cancer Father   . Heart failure Mother   . Hypertension Sister  Social History:  reports that he quit smoking about 34 years ago. His smoking use included cigarettes. He started smoking about 60 years ago. He has a 60.00 pack-year smoking history. He has quit using smokeless tobacco.  His smokeless tobacco use included chew. He reports current alcohol use. He reports that he does not use drugs.  Allergies: No Known Allergies  Medications Prior to Admission  Medication Sig Dispense Refill  . acetaminophen (TYLENOL) 500 MG tablet Take 500-1,000 mg by mouth every 6 (six) hours as needed (for pain.). Take 2 tablets (1000 mg) by mouth in the morning    .  allopurinol (ZYLOPRIM) 300 MG tablet Take 300 mg by mouth daily.    . Ascorbic Acid (VITAMIN C) 1000 MG tablet Take 1,000 mg by mouth daily.    Marland Kitchen aspirin EC 81 MG tablet Take 1 tablet (81 mg total) by mouth daily. 90 tablet 3  . famotidine (PEPCID) 20 MG tablet Take 20 mg by mouth daily.    . Glucosamine-Chondroitin (OSTEO BI-FLEX REGULAR STRENGTH PO) Take 1 tablet by mouth daily.    . hydrochlorothiazide (HYDRODIURIL) 50 MG tablet Take 50 mg by mouth daily.    . meloxicam (MOBIC) 7.5 MG tablet Take 7.5 mg by mouth at bedtime.    . metoprolol tartrate (LOPRESSOR) 25 MG tablet Take 25 mg by mouth 2 (two) times daily.     . Multiple Vitamin (MULITIVITAMIN WITH MINERALS) TABS Take 1 tablet by mouth daily.    . Omega-3 Fatty Acids (FISH OIL) 1200 MG CAPS Take 1,200 mg by mouth daily.    . potassium chloride (K-DUR,KLOR-CON) 10 MEQ tablet Take 10 mEq by mouth daily.    . pravastatin (PRAVACHOL) 40 MG tablet Take 40 mg by mouth every evening.     . tamsulosin (FLOMAX) 0.4 MG CAPS capsule Take 0.4 mg by mouth at bedtime.     . nitroGLYCERIN (NITROSTAT) 0.4 MG SL tablet Place 1 tablet (0.4 mg total) under the tongue every 5 (five) minutes as needed. 25 tablet 3    No results found for this or any previous visit (from the past 48 hour(s)). No results found.  ROS  Blood pressure 120/78, pulse (!) 53, temperature 98.3 F (36.8 C), temperature source Oral, resp. rate 14, height 6\' 1"  (1.854 m), weight 95.3 kg, SpO2 97 %. Physical Exam  Constitutional: He appears well-developed and well-nourished.  HENT:  Mouth/Throat: Oropharynx is clear and moist.  Eyes: Conjunctivae are normal. No scleral icterus.  Neck: No thyromegaly present.  Cardiovascular: Normal rate, regular rhythm and normal heart sounds.  No murmur heard. Respiratory: Effort normal and breath sounds normal.  Midsternal scar.  GI: Soft. He exhibits no distension and no mass. There is no abdominal tenderness.  Musculoskeletal:         General: No edema.  Lymphadenopathy:    He has no cervical adenopathy.  Neurological: He is alert.  Skin: Skin is warm and dry.     Assessment/Plan Epigastric pain nausea and bloating associated with weight loss. Diagnostic EGD.  Hildred Laser, MD 08/03/2018, 3:18 PM

## 2018-08-03 NOTE — Op Note (Signed)
[facility] Patient Name: Brandon [name] Procedure Date: 08/03/2018 2:55 PM MRN: [id number] Date of Birth: [birth year] Attending MD: Hildred Laser , MD CSN: [id number] Age: 72 Admit Type: Outpatient Procedure:                Upper GI endoscopy Indications:              Epigastric abdominal pain, Nausea Providers:                Hildred Laser, MD, Rosina Lowenstein, RN, Aram Candela Referring MD:             Jasper Loser. Luan Pulling, MD Medicines:                Lidocaine spray, Meperidine 50 mg IV, Midazolam 4                            mg IV Complications:            No immediate complications. Estimated Blood Loss:     Estimated blood loss was minimal. Procedure:                Pre-Anesthesia Assessment:                           - Prior to the procedure, a History and Physical                            was performed, and patient medications and                            allergies were reviewed. The patient's tolerance of                            previous anesthesia was also reviewed. The risks                            and benefits of the procedure and the sedation                            options and risks were discussed with the patient.                            All questions were answered, and informed consent                            was obtained. Prior Anticoagulants: The patient                            last took aspirin 3 days and previous NSAID                            medication 3 days prior to the procedure. ASA Grade                            Assessment: II - A patient with mild systemic  disease. After reviewing the risks and benefits,                            the patient was deemed in satisfactory condition to                            undergo the procedure.                           After obtaining informed consent, the endoscope was                            passed under direct vision. Throughout the   procedure, the patient's blood pressure, pulse, and                            oxygen saturations were monitored continuously. The                            [id number]-H190 ([id number]) scope was introduced through the                            mouth, and advanced to the second part of duodenum.                            The upper GI endoscopy was accomplished without                            difficulty. The patient tolerated the procedure                            well. Scope In: 3:29:23 PM Scope Out: 3:36:50 PM Total Procedure Duration: 0 hours 7 minutes 27 seconds  Findings:      The mid esophagus and distal esophagus were normal.      LA Grade B (one or more mucosal breaks greater than 5 mm, not extending       between the tops of two mucosal folds) esophagitis with no bleeding was       found 38 to 39 cm from the incisors.      The Z-line was irregular and was found 39 cm from the incisors.      A 3 cm hiatal hernia was present.      Multiple erosions were found in the gastric antrum and in the prepyloric       region of the stomach. Biopsies were taken with a cold forceps for       histology.      The exam of the stomach was otherwise normal.      The duodenal bulb and second portion of the duodenum were normal. Impression:               - Normal mid esophagus and distal esophagus.                           - LA Grade B reflux esophagitis.                           -  Z-line irregular, 39 cm from the incisors.                           - 3 cm hiatal hernia.                           - Erosive gastropathy. Biopsied.                           - Normal duodenal bulb and second portion of the                            duodenum. Moderate Sedation:      Moderate (conscious) sedation was administered by the endoscopy nurse       and supervised by the endoscopist. The following parameters were       monitored: oxygen saturation, heart rate, blood pressure, CO2       capnography and  response to care. Total physician intraservice time was       13 minutes. Recommendation:           - Patient has a contact number available for                            emergencies. The signs and symptoms of potential                            delayed complications were discussed with the                            patient. Return to normal activities tomorrow.                            Written discharge instructions were provided to the                            patient.                           - Resume previous diet today.                           - Continue present medications but take Famotidine                            at bedtime.                           - Esomeprazole 40 mg po qam.                           - Await pathology results.                           - Return to GI clinic in 8 weeks. Procedure Code(s):        --- Professional ---  40981, Esophagogastroduodenoscopy, flexible,                            transoral; with biopsy, single or multiple                           G0500, Moderate sedation services provided by the                            same physician or other qualified health care                            professional performing a gastrointestinal                            endoscopic service that sedation supports,                            requiring the presence of an independent trained                            observer to assist in the monitoring of the                            patient's level of consciousness and physiological                            status; initial 15 minutes of intra-service time;                            patient age 21 years or older (additional time may                            be reported with [id number], as appropriate) Diagnosis Code(s):        --- Professional ---                           K21.0, Gastro-esophageal reflux disease with                            esophagitis                            K22.8, Other specified diseases of esophagus                           K44.9, Diaphragmatic hernia without obstruction or                            gangrene                           K31.89, Other diseases of stomach and duodenum                           R10.13, Epigastric pain  R11.0, Nausea CPT copyright 2018 American [employer] Association. All rights reserved. The codes documented in this report are preliminary and upon coder review may  be revised to meet current compliance requirements. Hildred Laser, MD Hildred Laser, MD 08/03/2018 3:53:46 PM This report has been signed electronically. Number of Addenda: 0

## 2018-08-11 ENCOUNTER — Encounter (HOSPITAL_COMMUNITY): Payer: Self-pay | Admitting: Internal Medicine

## 2018-08-23 ENCOUNTER — Telehealth (INDEPENDENT_AMBULATORY_CARE_PROVIDER_SITE_OTHER): Payer: Self-pay | Admitting: Internal Medicine

## 2018-08-23 NOTE — Telephone Encounter (Signed)
Dr.Rehman states that he talked with the patient's wife giving her the results. He advised her that Brandon [name] will need a OV with him in the next 8 - 10 weeks. A call will be returned to the patient on 08/24/18.

## 2018-08-23 NOTE — Telephone Encounter (Signed)
Spouse called stated patient had a procedure last week and  Dr Laural Golden requested patient to call office - would like a call back at [phone]

## 2018-08-24 NOTE — Telephone Encounter (Signed)
Talked with [name] , patient's wife. She was calling to give [place] a progress report. Nexium is not helping the patient. He still has nausea , bloating. He is having headaches an runny nose, she relates this to the [facility] star tick bite. Bowels are loose to regular and when he has to go it is with urgency.  Meloxicam - patient is taking but not every day, he has only taken 1 this week. His arthritis is really bothering him.  He has appointment with Karna Christmas on February 18/2020. Will address with Dr.Rehman and call his wife back with any further recommendations. Cell number - [phone].

## 2018-08-28 ENCOUNTER — Other Ambulatory Visit (INDEPENDENT_AMBULATORY_CARE_PROVIDER_SITE_OTHER): Payer: Self-pay | Admitting: *Deleted

## 2018-08-28 DIAGNOSIS — R195 Other fecal abnormalities: Secondary | ICD-10-CM

## 2018-08-28 DIAGNOSIS — R110 Nausea: Secondary | ICD-10-CM

## 2018-08-28 DIAGNOSIS — R140 Abdominal distension (gaseous): Secondary | ICD-10-CM

## 2018-08-28 NOTE — Telephone Encounter (Signed)
Forwarded to Ann Tilley 

## 2018-08-28 NOTE — Telephone Encounter (Signed)
Per Dr.Rehman - will order a [place] U/S. Patient wife will be called to be made aware that referral coordinator will call with additional information. Order has been put in.

## 2018-08-28 NOTE — Telephone Encounter (Signed)
[place] sch'd 08/31/18 at 1130 (11:15), npo 8 hrs, patient aware

## 2018-08-31 ENCOUNTER — Ambulatory Visit (HOSPITAL_COMMUNITY)
Admission: RE | Admit: 2018-08-31 | Discharge: 2018-08-31 | Disposition: A | Discharge status: Home / Self Care | Payer: Medicare Other | Source: Ambulatory Visit | Attending: Internal Medicine | Admitting: Internal Medicine

## 2018-08-31 DIAGNOSIS — R195 Other fecal abnormalities: Secondary | ICD-10-CM | POA: Diagnosis not present

## 2018-08-31 DIAGNOSIS — R140 Abdominal distension (gaseous): Secondary | ICD-10-CM | POA: Insufficient documentation

## 2018-08-31 DIAGNOSIS — R110 Nausea: Secondary | ICD-10-CM | POA: Insufficient documentation

## 2018-08-31 DIAGNOSIS — K8020 Calculus of gallbladder without cholecystitis without obstruction: Secondary | ICD-10-CM | POA: Diagnosis not present

## 2018-09-03 ENCOUNTER — Encounter (INDEPENDENT_AMBULATORY_CARE_PROVIDER_SITE_OTHER): Payer: Self-pay | Admitting: Internal Medicine

## 2018-09-04 NOTE — Telephone Encounter (Signed)
This encounter was created in error - please disregard.

## 2018-09-06 DIAGNOSIS — M47812 Spondylosis without myelopathy or radiculopathy, cervical region: Secondary | ICD-10-CM | POA: Diagnosis not present

## 2018-09-06 DIAGNOSIS — M47816 Spondylosis without myelopathy or radiculopathy, lumbar region: Secondary | ICD-10-CM | POA: Diagnosis not present

## 2018-09-06 DIAGNOSIS — Z6828 Body mass index (BMI) 28.0-28.9, adult: Secondary | ICD-10-CM | POA: Diagnosis not present

## 2018-09-07 ENCOUNTER — Telehealth (INDEPENDENT_AMBULATORY_CARE_PROVIDER_SITE_OTHER): Payer: Self-pay | Admitting: Internal Medicine

## 2018-09-07 NOTE — Telephone Encounter (Signed)
Wife left message stated patient had a [place] on the 16th and Dr Laural Golden called and left message but they did not get all of the message - stated the phone cut off - would like a call back at [phone]

## 2018-09-08 NOTE — Telephone Encounter (Signed)
Dr.Rehman states that he will call the patient back. [name] was called and made aware. She ask that he call her cell number. This will be shared with Dr.Rehman. [phone].

## 2018-09-11 NOTE — Telephone Encounter (Signed)
See other note

## 2018-09-12 DIAGNOSIS — H259 Unspecified age-related cataract: Secondary | ICD-10-CM | POA: Diagnosis not present

## 2018-09-13 DIAGNOSIS — M19021 Primary osteoarthritis, right elbow: Secondary | ICD-10-CM | POA: Diagnosis not present

## 2018-09-14 ENCOUNTER — Ambulatory Visit (INDEPENDENT_AMBULATORY_CARE_PROVIDER_SITE_OTHER): Payer: Medicare Other | Admitting: General Surgery

## 2018-09-14 ENCOUNTER — Encounter: Payer: Self-pay | Admitting: General Surgery

## 2018-09-14 VITALS — BP 117/76 | HR 60 | Temp 98.0°F | Resp 18 | Wt 214.8 lb

## 2018-09-14 DIAGNOSIS — K8020 Calculus of gallbladder without cholecystitis without obstruction: Secondary | ICD-10-CM | POA: Diagnosis not present

## 2018-09-14 NOTE — Progress Notes (Signed)
[facility] History and Physical  Reason for Referral: Gallbladder / Abdominal Pain  Referring Physician:  Dr. Hermina Barters [name] is a 73 y.o. male.  HPI: [name] is a 73 yo with a history of epigastric pain and nausea/ bloating that started back in the summer. He was being worked up and noted that this always occurred with red meat. He was then diagnosed with the Tick Allergy per his report, and has stopped eating red meat since April 17, 2018.  He says that this pain in the epigastric region has continued since that time. He says the pain is cramping and does radiate out into the right side and sometimes up into his chest. He has been worked up with a CT scan in August without any etiology found and a HIDA scan in October that was normal. He underwent an EGD with Dr. Laural [name] that showed some esophagitis and gastritis and he has been on PPI therapy. He says that he continues to have this pain and that he feels constantly bloated. He ultimately has gotten an US of the RUQ and was found to have some gallstones.   He has a history of CABG in 2015 and denies any recent chest pain or SOB. He has been seen by Dr. Domenic Polite in August.  He says that he is active and works at [employer] daily.   EGD 07/2018 Normal mid esophagus and distal esophagus. - LA Grade B reflux esophagitis. - Z-line irregular, 39 cm from the incisors. - 3 cm hiatal hernia. - Erosive gastropathy. Biopsied. - Normal duodenal bulb and second portion of the duodenum  Past Medical History:  Diagnosis Date  . Cataract   . Coronary atherosclerosis of native coronary artery    Multivessel status post CABG March 2015  . DDD (degenerative disc disease), lumbar    Dr. Carloyn Manner  . Essential hypertension, benign   . GERD (gastroesophageal reflux disease)   . Hard of hearing   . Hemorrhoids   . History of gout   . History of kidney stones   . History of [facility] spotted fever   . Lumbago   .  Mixed hyperlipidemia   . Renal calculus or stone 05/22/14    Past Surgical History:  Procedure Laterality Date  . AMPUTATION Right 03/27/2014   Procedure: DEBRIDEMENT AND CLOSURE RIGHT INDEX FINGER;  Surgeon: [name], MD;  Location: [place];  Service: Orthopedics;  Laterality: Right;  . BACK SURGERY    . BICEPT TENODESIS  11/26/2011   Procedure: BICEPT TENODESIS;  Surgeon: Augustin Schooling, MD;  Location: [place];  Service: Orthopedics;  Laterality: Left;  . BIOPSY  08/03/2018   Procedure: BIOPSY;  Surgeon: Rogene Houston, MD;  [facility];  Service: Endoscopy;;  antral  . Cataract surgery Left   . COLONOSCOPY    . COLONOSCOPY N/A 12/26/2014   Procedure: COLONOSCOPY;  Surgeon: Rogene Houston, MD;  [facility];  Service: Endoscopy;  Laterality: N/A;  730  . CORONARY ARTERY BYPASS GRAFT N/A 10/19/2013   Procedure: CORONARY ARTERY BYPASS GRAFTING (CABG);  Surgeon: Rexene Alberts, MD;  Location: [place];  Service: Open Heart Surgery;  Laterality: N/A;  CABG times four using left internal mammary artery and left leg saphenous vein, incision made on right leg but no vein removed  . CYSTOSCOPY WITH RETROGRADE PYELOGRAM, URETEROSCOPY AND STENT PLACEMENT Left 08/24/2016   Procedure: CYSTOSCOPY WITH LEFT RETROGRADE PYELOGRAM, LEFT  URETEROSCOPY, BASKET EXTRACTION LEFT URETERAL STONE;  Surgeon: Irine Seal, MD;  Location: [facility];  Service: Urology;  Laterality: Left;  . ESOPHAGOGASTRODUODENOSCOPY N/A 08/03/2018   Procedure: ESOPHAGOGASTRODUODENOSCOPY (EGD);  Surgeon: Rogene Houston, MD;  [facility];  Service: Endoscopy;  Laterality: N/A;  2:55  . INTRAOPERATIVE TRANSESOPHAGEAL ECHOCARDIOGRAM N/A 10/19/2013   Procedure: INTRAOPERATIVE TRANSESOPHAGEAL ECHOCARDIOGRAM;  Surgeon: Rexene Alberts, MD;  Location: [place];  Service: Open Heart Surgery;  Laterality: N/A;  . JOINT REPLACEMENT    . LEFT HEART CATHETERIZATION WITH CORONARY ANGIOGRAM N/A 10/09/2013   Procedure: LEFT  HEART CATHETERIZATION WITH CORONARY ANGIOGRAM;  Surgeon: Burnell Blanks, MD;  Location: [facility];  Service: Cardiovascular;  Laterality: N/A;  . Left shoulder rotator cuff repair    . LITHOTRIPSY    . TOTAL KNEE ARTHROPLASTY Left   . VASECTOMY      Family History  Problem Relation Age of Onset  . Lung cancer Father   . Heart failure Mother   . Hypertension Sister     Social History   Tobacco Use  . Smoking status: Former Smoker    Packs/day: 2.00    Years: 30.00    Pack years: 60.00    Types: Cigarettes    Start date: 08/16/1958    Last attempt to quit: 08/17/1983    Years since quitting: 35.1  . Smokeless tobacco: Former Systems developer    Types: Chew  . Tobacco comment: quit smoking 47yrs ago  Substance Use Topics  . Alcohol use: Yes    Alcohol/week: 0.0 standard drinks    Comment: Occassional  . Drug use: No    Medications: I have reviewed the patient's current medications. Allergies as of 09/14/2018   No Known Allergies     Medication List       Accurate as of September 14, 2018  3:18 PM. Always use your most recent med list.        acetaminophen 500 MG tablet Commonly known as:  TYLENOL Take 500-1,000 mg by mouth every 6 (six) hours as needed (for pain.). Take 2 tablets (1000 mg) by mouth in the morning   allopurinol 300 MG tablet Commonly known as:  ZYLOPRIM Take 300 mg by mouth daily.   aspirin EC 81 MG tablet Take 1 tablet (81 mg total) by mouth daily.   esomeprazole 40 MG capsule Commonly known as:  NEXIUM Take 1 capsule (40 mg total) by mouth daily before breakfast.   famotidine 20 MG tablet Commonly known as:  PEPCID Take 1 tablet (20 mg total) by mouth at bedtime.   Fish Oil 1200 MG Caps Take 1,200 mg by mouth daily.   hydrochlorothiazide 50 MG tablet Commonly known as:  HYDRODIURIL Take 50 mg by mouth daily.   meloxicam 7.5 MG tablet Commonly known as:  MOBIC Take 7.5 mg by mouth at bedtime.   metoprolol tartrate 25 MG  tablet Commonly known as:  LOPRESSOR Take 25 mg by mouth 2 (two) times daily.   multivitamin with minerals Tabs tablet Take 1 tablet by mouth daily.   nitroGLYCERIN 0.4 MG SL tablet Commonly known as:  NITROSTAT Place 1 tablet (0.4 mg total) under the tongue every 5 (five) minutes as needed.   OSTEO BI-FLEX REGULAR STRENGTH PO Take 1 tablet by mouth daily.   potassium chloride 10 MEQ tablet Commonly known as:  K-DUR,KLOR-CON Take 10 mEq by mouth daily.   pravastatin 40 MG tablet Commonly known as:  PRAVACHOL Take 40 mg by mouth  every evening.   tamsulosin 0.4 MG Caps capsule Commonly known as:  FLOMAX Take 0.4 mg by mouth at bedtime.   vitamin C 1000 MG tablet Take 1,000 mg by mouth daily.        ROS:  A comprehensive review of systems was negative except for: Gastrointestinal: positive for abdominal pain, nausea and bloating Musculoskeletal: positive for back pain, neck pain and stiff joints  Blood pressure 117/76, pulse 60, temperature 98 F (36.7 C), temperature source Temporal, resp. rate 18, weight 214 lb 12.8 oz (97.4 kg). Physical Exam Vitals signs reviewed.  Constitutional:      Appearance: Normal appearance.  HENT:     Head: Normocephalic and atraumatic.     Nose: Nose normal.     Mouth/Throat:     Mouth: Mucous membranes are moist.  Eyes:     Pupils: Pupils are equal, round, and reactive to light.  Neck:     Musculoskeletal: Normal range of motion.  Cardiovascular:     Rate and Rhythm: Normal rate and regular rhythm.  Pulmonary:     Effort: Pulmonary effort is normal.     Breath sounds: Normal breath sounds.  Abdominal:     General: There is no distension.     Palpations: Abdomen is soft.     Tenderness: There is no abdominal tenderness.  Musculoskeletal: Normal range of motion.        General: No swelling.  Skin:    General: Skin is warm and dry.  Neurological:     General: No focal deficit present.     Mental Status: He is alert and  oriented to person, place, and time.  Psychiatric:        Mood and Affect: Mood normal.        Behavior: Behavior normal.        Thought Content: Thought content normal.        Judgment: Judgment normal.     Results: RUQ [place] 08/31/2018 IMPRESSION: 1. Minimal cholelithiasis without evidence of cholecystitis. 2. Diffuse hepatic steatosis. 3. 8 mm nonobstructing right renal calculus.  HIDA 04/2018 IMPRESSION: No scintigraphic evidence of acute cholecystitis or biliary dyskinesia. Normal gallbladder ejection fraction.    Assessment & Plan:  Brandon [name] is a 72 y.o. male with symptomatic cholelithiasis based on his symptoms. I think he likely has had some issues with the gallbladder all along and I am not sure how the tick allergy plays into the symptoms.  He continues to have some nausea and bloating despite his diet changes.  He had an EGD and has been on appropriate therapy without relief. He has no cardiac symptoms, and I sent Dr. Domenic Polite an Epic message to confirm he did not any concerns with [place] proceeding with surgery.  He felt that the patient was good to proceed without further testing as he has been stable and has no new symptoms.   PLAN: I counseled the patient about the indication, risks and benefits of laparoscopic cholecystectomy.  He understands there is a very small chance for bleeding, infection, injury to normal structures (including common bile duct), conversion to open surgery, persistent symptoms, evolution of postcholecystectomy diarrhea, need for secondary interventions, anesthesia reaction, cardiopulmonary issues and other risks not specifically detailed here. I described the expected recovery, the plan for follow-up and the restrictions during the recovery phase.  All questions were answered.   Virl Cagey 09/14/2018, 3:18 PM

## 2018-09-14 NOTE — Patient Instructions (Signed)
Laparoscopic Cholecystectomy Laparoscopic cholecystectomy is surgery to remove the gallbladder. The gallbladder is a pear-shaped organ that lies beneath the liver on the right side of the body. The gallbladder stores bile, which is a fluid that helps the body to digest fats. Cholecystectomy is often done for inflammation of the gallbladder (cholecystitis). This condition is usually caused by a buildup of gallstones (cholelithiasis) in the gallbladder. Gallstones can block the flow of bile, which can result in inflammation and pain. In severe cases, emergency surgery may be required. This procedure is done though small incisions in your abdomen (laparoscopic surgery). A thin scope with a camera (laparoscope) is inserted through one incision. Thin surgical instruments are inserted through the other incisions. In some cases, a laparoscopic procedure may be turned into a type of surgery that is done through a larger incision (open surgery). Tell a health care provider about:  Any allergies you have.  All medicines you are taking, including vitamins, herbs, eye drops, creams, and over-the-counter medicines.  Any problems you or family members have had with anesthetic medicines.  Any blood disorders you have.  Any surgeries you have had.  Any medical conditions you have.  Whether you are pregnant or may be pregnant. What are the risks? Generally, this is a safe procedure. However, problems may occur, including:  Infection.  Bleeding.  Allergic reactions to medicines.  Damage to other structures or organs.  A stone remaining in the common bile duct. The common bile duct carries bile from the gallbladder into the small intestine.  A bile leak from the cyst duct that is clipped when your gallbladder is removed. Medicines  Ask your health care provider about: ? Changing or stopping your regular medicines. This is especially important if you are taking diabetes medicines or blood  thinners. ? Taking medicines such as aspirin and ibuprofen. These medicines can thin your blood. Do not take these medicines before your procedure if your health care provider instructs you not to.  You may be given antibiotic medicine to help prevent infection. General instructions  Let your health care provider know if you develop a cold or an infection before surgery.  Plan to have someone take you home from the hospital or clinic.  Ask your health care provider how your surgical site will be marked or identified. What happens during the procedure?   To reduce your risk of infection: ? Your health care team will wash or sanitize their hands. ? Your skin will be washed with soap. ? Hair may be removed from the surgical area.  An IV tube may be inserted into one of your veins.  You will be given one or more of the following: ? A medicine to help you relax (sedative). ? A medicine to make you fall asleep (general anesthetic).  A breathing tube will be placed in your mouth.  Your surgeon will make several small cuts (incisions) in your abdomen.  The laparoscope will be inserted through one of the small incisions. The camera on the laparoscope will send images to a TV screen (monitor) in the operating room. This lets your surgeon see inside your abdomen.  Air-like gas will be pumped into your abdomen. This will expand your abdomen to give the surgeon more room to perform the surgery.  Other tools that are needed for the procedure will be inserted through the other incisions. The gallbladder will be removed through one of the incisions.  Your common bile duct may be examined. If stones are found  in the common bile duct, they may be removed.  After your gallbladder has been removed, the incisions will be closed with stitches (sutures), staples, or skin glue.  Your incisions may be covered with a bandage (dressing). The procedure may vary among health care providers and  hospitals. What happens after the procedure?  Your blood pressure, heart rate, breathing rate, and blood oxygen level will be monitored until the medicines you were given have worn off.  You will be given medicines as needed to control your pain.  Do not drive for 24 hours if you were given a sedative. This information is not intended to replace advice given to you by your health care provider. Make sure you discuss any questions you have with your health care provider. Document Released: 08/02/2005 Document Revised: 06/30/2017 Document Reviewed: 01/19/2016 Elsevier Interactive Patient Education  2019 [employer]. Cholelithiasis  Cholelithiasis is also called "gallstones." It is a kind of gallbladder disease. The gallbladder is an organ that stores a liquid (bile) that helps you digest fat. Gallstones may not cause symptoms (may be silent gallstones) until they cause a blockage, and then they can cause pain (gallbladder attack). Follow these instructions at home:  Take over-the-counter and prescription medicines only as told by your doctor.  Stay at a healthy weight.  Eat healthy foods. This includes: ? Eating fewer fatty foods, like fried foods. ? Eating fewer refined carbs (refined carbohydrates). Refined carbs are breads and grains that are highly processed, like white bread and white rice. Instead, choose whole grains like whole-wheat bread and brown rice. ? Eating more fiber. Almonds, fresh fruit, and beans are healthy sources of fiber.  Keep all follow-up visits as told by your doctor. This is important. Contact a doctor if:  You have sudden pain in the upper right side of your belly (abdomen). Pain might spread to your right shoulder or your chest. This may be a sign of a gallbladder attack.  You feel sick to your stomach (are nauseous).  You throw up (vomit).  You have been diagnosed with gallstones that have no symptoms and you get: ? Belly pain. ? Discomfort, burning,  or fullness in the upper part of your belly (indigestion). Get help right away if:  You have sudden pain in the upper right side of your belly, and it lasts for more than 2 hours.  You have belly pain that lasts for more than 5 hours.  You have a fever or chills.  You keep feeling sick to your stomach or you keep throwing up.  Your skin or the whites of your eyes turn yellow (jaundice).  You have dark-colored pee (urine).  You have light-colored poop (stool). Summary  Cholelithiasis is also called "gallstones."  The gallbladder is an organ that stores a liquid (bile) that helps you digest fat.  Silent gallstones are gallstones that do not cause symptoms.  A gallbladder attack may cause sudden pain in the upper right side of your belly. Pain might spread to your right shoulder or your chest. If this happens, contact your doctor.  If you have sudden pain in the upper right side of your belly that lasts for more than 2 hours, get help right away. This information is not intended to replace advice given to you by your health care provider. Make sure you discuss any questions you have with your health care provider. Document Released: 01/19/2008 Document Revised: 04/18/2016 Document Reviewed: 04/18/2016 Elsevier Interactive Patient Education  2019 [employer].

## 2018-09-15 NOTE — H&P (Signed)
[facility] History and Physical  Reason for Referral: Gallbladder / Abdominal Pain  Referring Physician:  Dr. Hermina [name] Brandon [name] is a 73 y.o. male.  HPI: [name] is a 73 yo with a history of epigastric pain and nausea/ bloating that started back in the summer. He was being worked up and noted that this always occurred with red meat. He was then diagnosed with the Tick Allergy per his report, and has stopped eating red meat since April 17, 2018.  He says that this pain in the epigastric region has continued since that time. He says the pain is cramping and does radiate out into the right side and sometimes up into his chest. He has been worked up with a CT scan in August without any etiology found and a HIDA scan in October that was normal. He underwent an EGD with Dr. Laural Golden that showed some esophagitis and gastritis and he has been on PPI therapy. He says that he continues to have this pain and that he feels constantly bloated. He ultimately has gotten an US of the RUQ and was found to have some gallstones.   He has a history of CABG in 2015 and denies any recent chest pain or SOB. He has been seen by Dr. Domenic Polite in August.  He says that he is active and works at [employer] daily.   EGD 07/2018 Normal mid esophagus and distal esophagus. - LA Grade B reflux esophagitis. - Z-line irregular, 39 cm from the incisors. - 3 cm hiatal hernia. - Erosive gastropathy. Biopsied. - Normal duodenal bulb and second portion of the duodenum      Past Medical History:  Diagnosis Date  . Cataract   . Coronary atherosclerosis of native coronary artery    Multivessel status post CABG March 2015  . DDD (degenerative disc disease), lumbar    Dr. Carloyn Manner  . Essential hypertension, benign   . GERD (gastroesophageal reflux disease)   . Hard of hearing   . Hemorrhoids   . History of gout   . History of kidney stones   . History of [facility] spotted fever    . Lumbago   . Mixed hyperlipidemia   . Renal calculus or stone 05/22/14         Past Surgical History:  Procedure Laterality Date  . AMPUTATION Right 03/27/2014   Procedure: DEBRIDEMENT AND CLOSURE RIGHT INDEX FINGER;  Surgeon: [name], MD;  Location: [place];  Service: Orthopedics;  Laterality: Right;  . BACK SURGERY    . BICEPT TENODESIS  11/26/2011   Procedure: BICEPT TENODESIS;  Surgeon: Augustin Schooling, MD;  Location: [place];  Service: Orthopedics;  Laterality: Left;  . BIOPSY  08/03/2018   Procedure: BIOPSY;  Surgeon: Rogene Houston, MD;  [facility];  Service: Endoscopy;;  antral  . Cataract surgery Left   . COLONOSCOPY    . COLONOSCOPY N/A 12/26/2014   Procedure: COLONOSCOPY;  Surgeon: Rogene Houston, MD;  [facility];  Service: Endoscopy;  Laterality: N/A;  730  . CORONARY ARTERY BYPASS GRAFT N/A 10/19/2013   Procedure: CORONARY ARTERY BYPASS GRAFTING (CABG);  Surgeon: Rexene Alberts, MD;  Location: [place];  Service: Open Heart Surgery;  Laterality: N/A;  CABG times four using left internal mammary artery and left leg saphenous vein, incision made on right leg but no vein removed  . CYSTOSCOPY WITH RETROGRADE PYELOGRAM, URETEROSCOPY AND STENT PLACEMENT Left 08/24/2016  Procedure: CYSTOSCOPY WITH LEFT RETROGRADE PYELOGRAM, LEFT URETEROSCOPY, BASKET EXTRACTION LEFT URETERAL STONE;  Surgeon: Irine Seal, MD;  Location: [facility];  Service: Urology;  Laterality: Left;  . ESOPHAGOGASTRODUODENOSCOPY N/A 08/03/2018   Procedure: ESOPHAGOGASTRODUODENOSCOPY (EGD);  Surgeon: Rogene Houston, MD;  [facility];  Service: Endoscopy;  Laterality: N/A;  2:55  . INTRAOPERATIVE TRANSESOPHAGEAL ECHOCARDIOGRAM N/A 10/19/2013   Procedure: INTRAOPERATIVE TRANSESOPHAGEAL ECHOCARDIOGRAM;  Surgeon: Rexene Alberts, MD;  Location: [place];  Service: Open Heart Surgery;  Laterality: N/A;  . JOINT REPLACEMENT    . LEFT HEART CATHETERIZATION WITH CORONARY  ANGIOGRAM N/A 10/09/2013   Procedure: LEFT HEART CATHETERIZATION WITH CORONARY ANGIOGRAM;  Surgeon: Burnell Blanks, MD;  Location: [facility];  Service: Cardiovascular;  Laterality: N/A;  . Left shoulder rotator cuff repair    . LITHOTRIPSY    . TOTAL KNEE ARTHROPLASTY Left   . VASECTOMY           Family History  Problem Relation Age of Onset  . Lung cancer Father   . Heart failure Mother   . Hypertension Sister     Social History        Tobacco Use  . Smoking status: Former Smoker    Packs/day: 2.00    Years: 30.00    Pack years: 60.00    Types: Cigarettes    Start date: 08/16/1958    Last attempt to quit: 08/17/1983    Years since quitting: 35.1  . Smokeless tobacco: Former Systems developer    Types: Chew  . Tobacco comment: quit smoking 64yrs ago  Substance Use Topics  . Alcohol use: Yes    Alcohol/week: 0.0 standard drinks    Comment: Occassional  . Drug use: No    Medications: I have reviewed the patient's current medications. Allergies as of 09/14/2018   No Known Allergies        Medication List       Accurate as of September 14, 2018  3:18 PM. Always use your most recent med list.        acetaminophen 500 MG tablet Commonly known as:  TYLENOL Take 500-1,000 mg by mouth every 6 (six) hours as needed (for pain.). Take 2 tablets (1000 mg) by mouth in the morning   allopurinol 300 MG tablet Commonly known as:  ZYLOPRIM Take 300 mg by mouth daily.   aspirin EC 81 MG tablet Take 1 tablet (81 mg total) by mouth daily.   esomeprazole 40 MG capsule Commonly known as:  NEXIUM Take 1 capsule (40 mg total) by mouth daily before breakfast.   famotidine 20 MG tablet Commonly known as:  PEPCID Take 1 tablet (20 mg total) by mouth at bedtime.   Fish Oil 1200 MG Caps Take 1,200 mg by mouth daily.   hydrochlorothiazide 50 MG tablet Commonly known as:  HYDRODIURIL Take 50 mg by mouth daily.   meloxicam 7.5 MG  tablet Commonly known as:  MOBIC Take 7.5 mg by mouth at bedtime.   metoprolol tartrate 25 MG tablet Commonly known as:  LOPRESSOR Take 25 mg by mouth 2 (two) times daily.   multivitamin with minerals Tabs tablet Take 1 tablet by mouth daily.   nitroGLYCERIN 0.4 MG SL tablet Commonly known as:  NITROSTAT Place 1 tablet (0.4 mg total) under the tongue every 5 (five) minutes as needed.   OSTEO BI-FLEX REGULAR STRENGTH PO Take 1 tablet by mouth daily.   potassium chloride 10 MEQ tablet Commonly known as:  K-DUR,KLOR-CON Take 10  mEq by mouth daily.   pravastatin 40 MG tablet Commonly known as:  PRAVACHOL Take 40 mg by mouth every evening.   tamsulosin 0.4 MG Caps capsule Commonly known as:  FLOMAX Take 0.4 mg by mouth at bedtime.   vitamin C 1000 MG tablet Take 1,000 mg by mouth daily.        ROS:  A comprehensive review of systems was negative except for: Gastrointestinal: positive for abdominal pain, nausea and bloating Musculoskeletal: positive for back pain, neck pain and stiff joints  Blood pressure 117/76, pulse 60, temperature 98 F (36.7 C), temperature source Temporal, resp. rate 18, weight 214 lb 12.8 oz (97.4 kg). Physical Exam Vitals signs reviewed.  Constitutional:      Appearance: Normal appearance.  HENT:     Head: Normocephalic and atraumatic.     Nose: Nose normal.     Mouth/Throat:     Mouth: Mucous membranes are moist.  Eyes:     Pupils: Pupils are equal, round, and reactive to light.  Neck:     Musculoskeletal: Normal range of motion.  Cardiovascular:     Rate and Rhythm: Normal rate and regular rhythm.  Pulmonary:     Effort: Pulmonary effort is normal.     Breath sounds: Normal breath sounds.  Abdominal:     General: There is no distension.     Palpations: Abdomen is soft.     Tenderness: There is no abdominal tenderness.  Musculoskeletal: Normal range of motion.        General: No swelling.  Skin:    General: Skin is  warm and dry.  Neurological:     General: No focal deficit present.     Mental Status: He is alert and oriented to person, place, and time.  Psychiatric:        Mood and Affect: Mood normal.        Behavior: Behavior normal.        Thought Content: Thought content normal.        Judgment: Judgment normal.     Results: RUQ [place] 08/31/2018 IMPRESSION: 1. Minimal cholelithiasis without evidence of cholecystitis. 2. Diffuse hepatic steatosis. 3. 8 mm nonobstructing right renal calculus.  HIDA 04/2018 IMPRESSION: No scintigraphic evidence of acute cholecystitis or biliary dyskinesia. Normal gallbladder ejection fraction.    Assessment & Plan:  HUSEIN GUEDES is a 73 y.o. male with symptomatic cholelithiasis based on his symptoms. I think he likely has had some issues with the gallbladder all along and I am not sure how the tick allergy plays into the symptoms.  He continues to have some nausea and bloating despite his diet changes.  He had an EGD and has been on appropriate therapy without relief. He has no cardiac symptoms, and I sent Dr. Domenic Polite an Epic message to confirm he did not any concerns with [place] proceeding with surgery.  He felt that the patient was good to proceed without further testing as he has been stable and has no new symptoms.   PLAN: I counseled the patient about the indication, risks and benefits of laparoscopic cholecystectomy.  He understands there is a very small chance for bleeding, infection, injury to normal structures (including common bile duct), conversion to open surgery, persistent symptoms, evolution of postcholecystectomy diarrhea, need for secondary interventions, anesthesia reaction, cardiopulmonary issues and other risks not specifically detailed here. I described the expected recovery, the plan for follow-up and the restrictions during the recovery phase.  All questions were answered.  Virl Cagey 09/14/2018, 3:18 PM

## 2018-09-18 NOTE — Patient Instructions (Signed)
Brandon [name]  09/18/2018     @PREFPERIOPPHARMACY @   Your procedure is scheduled on  09/22/2018.  Report to [facility] at  945  A.M.  Call this number if you have problems the morning of surgery:  [phone]   Remember:  Do not eat or drink after midnight.                        Take these medicines the morning of surgery with A SIP OF WATER  Allopurinol, nexium, metoprolol.    Do not wear jewelry, make-up or nail polish.  Do not wear lotions, powders, or perfumes, or deodorant.  Do not shave 48 hours prior to surgery.  Men may shave face and neck.  Do not bring valuables to the hospital.  [facility] is not responsible for any belongings or valuables.  Contacts, dentures or bridgework may not be worn into surgery.  Leave your suitcase in the car.  After surgery it may be brought to your room.  For patients admitted to the hospital, discharge time will be determined by your treatment team.  Patients discharged the day of surgery will not be allowed to drive home.   Name and phone number of your driver:   family Special instructions:  None  Please read over the following fact sheets that you were given. Anesthesia Post-op Instructions and Care and Recovery After Surgery       Laparoscopic Cholecystectomy Laparoscopic cholecystectomy is surgery to remove the gallbladder. The gallbladder is a pear-shaped organ that lies beneath the liver on the right side of the body. The gallbladder stores bile, which is a fluid that helps the body to digest fats. Cholecystectomy is often done for inflammation of the gallbladder (cholecystitis). This condition is usually caused by a buildup of gallstones (cholelithiasis) in the gallbladder. Gallstones can block the flow of bile, which can result in inflammation and pain. In severe cases, emergency surgery may be required. This procedure is done though small incisions in your abdomen (laparoscopic surgery). A thin scope  with a camera (laparoscope) is inserted through one incision. Thin surgical instruments are inserted through the other incisions. In some cases, a laparoscopic procedure may be turned into a type of surgery that is done through a larger incision (open surgery). Tell a health care provider about:  Any allergies you have.  All medicines you are taking, including vitamins, herbs, eye drops, creams, and over-the-counter medicines.  Any problems you or family members have had with anesthetic medicines.  Any blood disorders you have.  Any surgeries you have had.  Any medical conditions you have.  Whether you are pregnant or may be pregnant. What are the risks? Generally, this is a safe procedure. However, problems may occur, including:  Infection.  Bleeding.  Allergic reactions to medicines.  Damage to other structures or organs.  A stone remaining in the common bile duct. The common bile duct carries bile from the gallbladder into the small intestine.  A bile leak from the cyst duct that is clipped when your gallbladder is removed. What happens before the procedure? Staying hydrated Follow instructions from your health care provider about hydration, which may include:  Up to 2 hours before the procedure - you may continue to drink clear liquids, such as water, clear fruit juice, black coffee, and plain tea. Eating and drinking restrictions Follow instructions from your health care provider about eating  and drinking, which may include:  8 hours before the procedure - stop eating heavy meals or foods such as meat, fried foods, or fatty foods.  6 hours before the procedure - stop eating light meals or foods, such as toast or cereal.  6 hours before the procedure - stop drinking milk or drinks that contain milk.  2 hours before the procedure - stop drinking clear liquids. Medicines  Ask your health care provider about: ? Changing or stopping your regular medicines. This is  especially important if you are taking diabetes medicines or blood thinners. ? Taking medicines such as aspirin and ibuprofen. These medicines can thin your blood. Do not take these medicines before your procedure if your health care provider instructs you not to.  You may be given antibiotic medicine to help prevent infection. General instructions  Let your health care provider know if you develop a cold or an infection before surgery.  Plan to have someone take you home from the hospital or clinic.  Ask your health care provider how your surgical site will be marked or identified. What happens during the procedure?   To reduce your risk of infection: ? Your health care team will wash or sanitize their hands. ? Your skin will be washed with soap. ? Hair may be removed from the surgical area.  An IV tube may be inserted into one of your veins.  You will be given one or more of the following: ? A medicine to help you relax (sedative). ? A medicine to make you fall asleep (general anesthetic).  A breathing tube will be placed in your mouth.  Your surgeon will make several small cuts (incisions) in your abdomen.  The laparoscope will be inserted through one of the small incisions. The camera on the laparoscope will send images to a TV screen (monitor) in the operating room. This lets your surgeon see inside your abdomen.  Air-like gas will be pumped into your abdomen. This will expand your abdomen to give the surgeon more room to perform the surgery.  Other tools that are needed for the procedure will be inserted through the other incisions. The gallbladder will be removed through one of the incisions.  Your common bile duct may be examined. If stones are found in the common bile duct, they may be removed.  After your gallbladder has been removed, the incisions will be closed with stitches (sutures), staples, or skin glue.  Your incisions may be covered with a bandage  (dressing). The procedure may vary among health care providers and hospitals. What happens after the procedure?  Your blood pressure, heart rate, breathing rate, and blood oxygen level will be monitored until the medicines you were given have worn off.  You will be given medicines as needed to control your pain.  Do not drive for 24 hours if you were given a sedative. This information is not intended to replace advice given to you by your health care provider. Make sure you discuss any questions you have with your health care provider. Document Released: 08/02/2005 Document Revised: 06/30/2017 Document Reviewed: 01/19/2016 Elsevier Interactive Patient Education  2019 [employer]  Laparoscopic Cholecystectomy, Care After This sheet gives you information about how to care for yourself after your procedure. Your health care provider may also give you more specific instructions. If you have problems or questions, contact your health care provider. What can I expect after the procedure? After the procedure, it is common to have:  Pain at your  incision sites. You will be given medicines to control this pain.  Mild nausea or vomiting.  Bloating and possible shoulder pain from the air-like gas that was used during the procedure. Follow these instructions at home: Incision care   Follow instructions from your health care provider about how to take care of your incisions. Make sure you: ? Wash your hands with soap and water before you change your bandage (dressing). If soap and water are not available, use hand sanitizer. ? Change your dressing as told by your health care provider. ? Leave stitches (sutures), skin glue, or adhesive strips in place. These skin closures may need to be in place for 2 weeks or longer. If adhesive strip edges start to loosen and curl up, you may trim the loose edges. Do not remove adhesive strips completely unless your health care provider tells you to do  that.  Do not take baths, swim, or use a hot tub until your health care provider approves. Ask your health care provider if you can take showers. You may only be allowed to take sponge baths for bathing.  Check your incision area every day for signs of infection. Check for: ? More redness, swelling, or pain. ? More fluid or blood. ? Warmth. ? Pus or a bad smell. Activity  Do not drive or use heavy machinery while taking prescription pain medicine.  Do not lift anything that is heavier than 10 lb (4.5 kg) until your health care provider approves.  Do not play contact sports until your health care provider approves.  Do not drive for 24 hours if you were given a medicine to help you relax (sedative).  Rest as needed. Do not return to work or school until your health care provider approves. General instructions  Take over-the-counter and prescription medicines only as told by your health care provider.  To prevent or treat constipation while you are taking prescription pain medicine, your health care provider may recommend that you: ? Drink enough fluid to keep your urine clear or pale yellow. ? Take over-the-counter or prescription medicines. ? Eat foods that are high in fiber, such as fresh fruits and vegetables, whole grains, and beans. ? Limit foods that are high in fat and processed sugars, such as fried and sweet foods. Contact a health care provider if:  You develop a rash.  You have more redness, swelling, or pain around your incisions.  You have more fluid or blood coming from your incisions.  Your incisions feel warm to the touch.  You have pus or a bad smell coming from your incisions.  You have a fever.  One or more of your incisions breaks open. Get help right away if:  You have trouble breathing.  You have chest pain.  You have increasing pain in your shoulders.  You faint or feel dizzy when you stand.  You have severe pain in your abdomen.  You have  nausea or vomiting that lasts for more than one day.  You have leg pain. This information is not intended to replace advice given to you by your health care provider. Make sure you discuss any questions you have with your health care provider. Document Released: 08/02/2005 Document Revised: 02/21/2016 Document Reviewed: 01/19/2016 Elsevier Interactive Patient Education  2019 [place] Anesthesia, Adult General anesthesia is the use of medicines to make a person "go to sleep" (unconscious) for a medical procedure. General anesthesia must be used for certain procedures, and is often recommended for  procedures that:  Last a long time.  Require you to be still or in an unusual position.  Are major and can cause blood loss. The medicines used for general anesthesia are called general anesthetics. As well as making you unconscious for a certain amount of time, these medicines:  Prevent pain.  Control your blood pressure.  Relax your muscles. Tell a health care provider about:  Any allergies you have.  All medicines you are taking, including vitamins, herbs, eye drops, creams, and over-the-counter medicines.  Any problems you or family members have had with anesthetic medicines.  Types of anesthetics you have had in the past.  Any blood disorders you have.  Any surgeries you have had.  Any medical conditions you have.  Any recent upper respiratory, chest, or ear infections.  Any history of: ? Heart or lung conditions, such as heart failure, sleep apnea, asthma, or chronic obstructive pulmonary disease (COPD). ? Armed forces logistics/support/administrative officer. ? Depression or anxiety.  Any tobacco or drug use, including marijuana or alcohol use.  Whether you are pregnant or may be pregnant. What are the risks? Generally, this is a safe procedure. However, problems may occur, including:  Allergic reaction.  Lung and heart problems.  Inhaling food or liquid from the stomach into the lungs  (aspiration).  Nerve injury.  Dental injury.  Air in the bloodstream, which can lead to stroke.  Extreme agitation or confusion (delirium) when you wake up from the anesthetic.  Waking up during your procedure and being unable to move. This is rare. These problems are more likely to develop if you are having a major surgery or if you have an advanced or serious medical condition. You can prevent some of these complications by answering all of your health care provider's questions thoroughly and by following all instructions before your procedure. General anesthesia can cause side effects, including:  Nausea or vomiting.  A sore throat from the breathing tube.  Hoarseness.  Wheezing or coughing.  Shaking chills.  Tiredness.  Body aches.  Anxiety.  Sleepiness or drowsiness.  Confusion or agitation. What happens before the procedure? Staying hydrated Follow instructions from your health care provider about hydration, which may include:  Up to 2 hours before the procedure - you may continue to drink clear liquids, such as water, clear fruit juice, black coffee, and plain tea.  Eating and drinking restrictions Follow instructions from your health care provider about eating and drinking, which may include:  8 hours before the procedure - stop eating heavy meals or foods such as meat, fried foods, or fatty foods.  6 hours before the procedure - stop eating light meals or foods, such as toast or cereal.  6 hours before the procedure - stop drinking milk or drinks that contain milk.  2 hours before the procedure - stop drinking clear liquids. Medicines Ask your health care provider about:  Changing or stopping your regular medicines. This is especially important if you are taking diabetes medicines or blood thinners.  Taking medicines such as aspirin and ibuprofen. These medicines can thin your blood. Do not take these medicines unless your health care provider tells you  to take them.  Taking over-the-counter medicines, vitamins, herbs, and supplements. Do not take these during the week before your procedure unless your health care provider approves them. General instructions  Starting 3-6 weeks before the procedure, do not use any products that contain nicotine or tobacco, such as cigarettes and e-cigarettes. If you need help quitting, ask your  health care provider.  If you brush your teeth on the morning of the procedure, make sure to spit out all of the toothpaste.  Tell your health care provider if you become ill or develop a cold, cough, or fever.  If instructed by your health care provider, bring your sleep apnea device with you on the day of your surgery (if applicable).  Ask your health care provider if you will be going home the same day, the following day, or after a longer hospital stay. ? Plan to have someone take you home from the hospital or clinic. ? Plan to have a responsible adult care for you for at least 24 hours after you leave the hospital or clinic. This is important. What happens during the procedure?   You will be given anesthetics through both of the following: ? A mask placed over your nose and mouth. ? An IV in one of your veins.  You may receive a medicine to help you relax (sedative).  After you are unconscious, a breathing tube may be inserted down your throat to help you breathe. This will be removed before you wake up.  An anesthesia specialist will stay with you throughout your procedure. He or she will: ? Keep you comfortable and safe by continuing to give you medicines and adjusting the amount of medicine that you get. ? Monitor your blood pressure, pulse, and oxygen levels to make sure that the anesthetics do not cause any problems. The procedure may vary among health care providers and hospitals. What happens after the procedure?  Your blood pressure, temperature, heart rate, breathing rate, and blood oxygen level  will be monitored until the medicines you were given have worn off.  You will wake up in a recovery area. You may wake up slowly.  If you feel anxious or agitated, you may be given medicine to help you calm down.  If you will be going home the same day, your health care provider may check to make sure you can walk, drink, and urinate.  Your health care provider will treat any pain or side effects you have before you go home.  Do not drive for 24 hours if you were given a sedative. Summary  General anesthesia is used to keep you still and prevent pain during a procedure.  It is important to tell your health care provider about your medical history and any surgeries you have had, and previous experience with anesthesia.  Follow your health care provider's instructions about when to stop eating, drinking, or taking certain medicines before your procedure.  Plan to have someone take you home from the hospital or clinic. This information is not intended to replace advice given to you by your health care provider. Make sure you discuss any questions you have with your health care provider. Document Released: 11/09/2007 Document Revised: 12/20/2017 Document Reviewed: 03/18/2017 Elsevier Interactive Patient Education  2019 [place] Anesthesia, Adult, Care After This sheet gives you information about how to care for yourself after your procedure. Your health care provider may also give you more specific instructions. If you have problems or questions, contact your health care provider. What can I expect after the procedure? After the procedure, the following side effects are common:  Pain or discomfort at the IV site.  Nausea.  Vomiting.  Sore throat.  Trouble concentrating.  Feeling cold or chills.  Weak or tired.  Sleepiness and fatigue.  Soreness and body aches. These side effects can affect parts  of the body that were not involved in surgery. Follow these  instructions at home:  For at least 24 hours after the procedure:  Have a responsible adult stay with you. It is important to have someone help care for you until you are awake and alert.  Rest as needed.  Do not: ? Participate in activities in which you could fall or become injured. ? Drive. ? Use heavy machinery. ? Drink alcohol. ? Take sleeping pills or medicines that cause drowsiness. ? Make important decisions or sign legal documents. ? Take care of children on your own. Eating and drinking  Follow any instructions from your health care provider about eating or drinking restrictions.  When you feel hungry, start by eating small amounts of foods that are soft and easy to digest (bland), such as toast. Gradually return to your regular diet.  Drink enough fluid to keep your urine pale yellow.  If you vomit, rehydrate by drinking water, juice, or clear broth. General instructions  If you have sleep apnea, surgery and certain medicines can increase your risk for breathing problems. Follow instructions from your health care provider about wearing your sleep device: ? Anytime you are sleeping, including during daytime naps. ? While taking prescription pain medicines, sleeping medicines, or medicines that make you drowsy.  Return to your normal activities as told by your health care provider. Ask your health care provider what activities are safe for you.  Take over-the-counter and prescription medicines only as told by your health care provider.  If you smoke, do not smoke without supervision.  Keep all follow-up visits as told by your health care provider. This is important. Contact a health care provider if:  You have nausea or vomiting that does not get better with medicine.  You cannot eat or drink without vomiting.  You have pain that does not get better with medicine.  You are unable to pass urine.  You develop a skin rash.  You have a fever.  You have redness  around your IV site that gets worse. Get help right away if:  You have difficulty breathing.  You have chest pain.  You have blood in your urine or stool, or you vomit blood. Summary  After the procedure, it is common to have a sore throat or nausea. It is also common to feel tired.  Have a responsible adult stay with you for the first 24 hours after general anesthesia. It is important to have someone help care for you until you are awake and alert.  When you feel hungry, start by eating small amounts of foods that are soft and easy to digest (bland), such as toast. Gradually return to your regular diet.  Drink enough fluid to keep your urine pale yellow.  Return to your normal activities as told by your health care provider. Ask your health care provider what activities are safe for you. This information is not intended to replace advice given to you by your health care provider. Make sure you discuss any questions you have with your health care provider. Document Released: 11/08/2000 Document Revised: 03/18/2017 Document Reviewed: 03/18/2017 Elsevier Interactive Patient Education  2019 [employer].

## 2018-09-19 ENCOUNTER — Telehealth: Payer: Self-pay | Admitting: Emergency Medicine

## 2018-09-19 ENCOUNTER — Encounter (HOSPITAL_COMMUNITY)
Admission: RE | Admit: 2018-09-19 | Discharge: 2018-09-19 | Disposition: A | Discharge status: Home / Self Care | Payer: Medicare Other | Source: Ambulatory Visit | Attending: General Surgery | Admitting: General Surgery

## 2018-09-19 NOTE — Telephone Encounter (Signed)
Called patient to f/u with him about missing his pre op appointment today , surgery is scheduled for 09/22/18. No response left vm and notified doctor bridges

## 2018-09-21 ENCOUNTER — Other Ambulatory Visit: Payer: Self-pay

## 2018-09-21 ENCOUNTER — Encounter (HOSPITAL_COMMUNITY)
Admission: RE | Admit: 2018-09-21 | Discharge: 2018-09-21 | Disposition: A | Discharge status: Home / Self Care | Payer: Medicare Other | Source: Ambulatory Visit | Attending: General Surgery | Admitting: General Surgery

## 2018-09-21 ENCOUNTER — Encounter (HOSPITAL_COMMUNITY): Payer: Self-pay

## 2018-09-21 DIAGNOSIS — Z79899 Other long term (current) drug therapy: Secondary | ICD-10-CM | POA: Diagnosis not present

## 2018-09-21 DIAGNOSIS — Z01812 Encounter for preprocedural laboratory examination: Secondary | ICD-10-CM

## 2018-09-21 DIAGNOSIS — Z87891 Personal history of nicotine dependence: Secondary | ICD-10-CM | POA: Diagnosis not present

## 2018-09-21 DIAGNOSIS — E782 Mixed hyperlipidemia: Secondary | ICD-10-CM | POA: Diagnosis not present

## 2018-09-21 DIAGNOSIS — Z96652 Presence of left artificial knee joint: Secondary | ICD-10-CM | POA: Diagnosis not present

## 2018-09-21 DIAGNOSIS — R109 Unspecified abdominal pain: Secondary | ICD-10-CM | POA: Diagnosis present

## 2018-09-21 DIAGNOSIS — M109 Gout, unspecified: Secondary | ICD-10-CM | POA: Diagnosis not present

## 2018-09-21 DIAGNOSIS — I10 Essential (primary) hypertension: Secondary | ICD-10-CM | POA: Diagnosis not present

## 2018-09-21 DIAGNOSIS — K8010 Calculus of gallbladder with chronic cholecystitis without obstruction: Secondary | ICD-10-CM | POA: Diagnosis not present

## 2018-09-21 DIAGNOSIS — Z7982 Long term (current) use of aspirin: Secondary | ICD-10-CM | POA: Diagnosis not present

## 2018-09-21 DIAGNOSIS — I2510 Atherosclerotic heart disease of native coronary artery without angina pectoris: Secondary | ICD-10-CM | POA: Diagnosis not present

## 2018-09-21 DIAGNOSIS — K210 Gastro-esophageal reflux disease with esophagitis: Secondary | ICD-10-CM | POA: Diagnosis not present

## 2018-09-21 DIAGNOSIS — K760 Fatty (change of) liver, not elsewhere classified: Secondary | ICD-10-CM | POA: Diagnosis not present

## 2018-09-21 DIAGNOSIS — Z791 Long term (current) use of non-steroidal anti-inflammatories (NSAID): Secondary | ICD-10-CM | POA: Diagnosis not present

## 2018-09-21 DIAGNOSIS — Z89021 Acquired absence of right finger(s): Secondary | ICD-10-CM | POA: Diagnosis not present

## 2018-09-21 DIAGNOSIS — Z951 Presence of aortocoronary bypass graft: Secondary | ICD-10-CM | POA: Diagnosis not present

## 2018-09-21 LAB — CBC
HCT: 45.8 % (ref 39.0–52.0)
Hemoglobin: 14.6 g/dL (ref 13.0–17.0)
MCH: 29.3 pg (ref 26.0–34.0)
MCHC: 31.9 g/dL (ref 30.0–36.0)
MCV: 91.8 fL (ref 80.0–100.0)
Platelets: 236 10*3/uL (ref 150–400)
RBC: 4.99 MIL/uL (ref 4.22–5.81)
RDW: 13.1 % (ref 11.5–15.5)
WBC: 8.5 10*3/uL (ref 4.0–10.5)
nRBC: 0 % (ref 0.0–0.2)

## 2018-09-21 LAB — BASIC METABOLIC PANEL
Anion gap: 8 (ref 5–15)
BUN: 19 mg/dL (ref 8–23)
CO2: 23 mmol/L (ref 22–32)
Calcium: 9.1 mg/dL (ref 8.9–10.3)
Chloride: 106 mmol/L (ref 98–111)
Creatinine, Ser: 1.05 mg/dL (ref 0.61–1.24)
GFR calc Af Amer: >60 mL/min (ref >60)
GFR calc non Af Amer: >60 mL/min (ref >60)
Glucose, Bld: 128 mg/dL — ABNORMAL HIGH (ref 70–99)
Potassium: 3.2 mmol/L — ABNORMAL LOW (ref 3.5–5.1)
Sodium: 137 mmol/L (ref 135–145)

## 2018-09-22 ENCOUNTER — Ambulatory Visit (HOSPITAL_COMMUNITY): Payer: Medicare Other | Admitting: Anesthesiology

## 2018-09-22 ENCOUNTER — Encounter (HOSPITAL_COMMUNITY): Payer: Self-pay

## 2018-09-22 ENCOUNTER — Ambulatory Visit (HOSPITAL_COMMUNITY)
Admission: RE | Admit: 2018-09-22 | Discharge: 2018-09-22 | Disposition: A | Discharge status: Home / Self Care | Payer: Medicare Other | Attending: General Surgery | Admitting: General Surgery

## 2018-09-22 ENCOUNTER — Encounter (HOSPITAL_COMMUNITY)
Admission: RE | Disposition: A | Discharge status: Home / Self Care | Payer: Self-pay | Source: Home / Self Care | Attending: General Surgery

## 2018-09-22 DIAGNOSIS — K760 Fatty (change of) liver, not elsewhere classified: Secondary | ICD-10-CM | POA: Insufficient documentation

## 2018-09-22 DIAGNOSIS — Z951 Presence of aortocoronary bypass graft: Secondary | ICD-10-CM | POA: Insufficient documentation

## 2018-09-22 DIAGNOSIS — Z89021 Acquired absence of right finger(s): Secondary | ICD-10-CM | POA: Diagnosis not present

## 2018-09-22 DIAGNOSIS — K8020 Calculus of gallbladder without cholecystitis without obstruction: Secondary | ICD-10-CM | POA: Diagnosis not present

## 2018-09-22 DIAGNOSIS — Z96652 Presence of left artificial knee joint: Secondary | ICD-10-CM | POA: Insufficient documentation

## 2018-09-22 DIAGNOSIS — Z791 Long term (current) use of non-steroidal anti-inflammatories (NSAID): Secondary | ICD-10-CM | POA: Insufficient documentation

## 2018-09-22 DIAGNOSIS — Z87891 Personal history of nicotine dependence: Secondary | ICD-10-CM | POA: Insufficient documentation

## 2018-09-22 DIAGNOSIS — M109 Gout, unspecified: Secondary | ICD-10-CM | POA: Insufficient documentation

## 2018-09-22 DIAGNOSIS — K219 Gastro-esophageal reflux disease without esophagitis: Secondary | ICD-10-CM | POA: Diagnosis not present

## 2018-09-22 DIAGNOSIS — K8010 Calculus of gallbladder with chronic cholecystitis without obstruction: Secondary | ICD-10-CM | POA: Diagnosis not present

## 2018-09-22 DIAGNOSIS — I10 Essential (primary) hypertension: Secondary | ICD-10-CM | POA: Diagnosis not present

## 2018-09-22 DIAGNOSIS — Z79899 Other long term (current) drug therapy: Secondary | ICD-10-CM | POA: Diagnosis not present

## 2018-09-22 DIAGNOSIS — I2510 Atherosclerotic heart disease of native coronary artery without angina pectoris: Secondary | ICD-10-CM | POA: Insufficient documentation

## 2018-09-22 DIAGNOSIS — K210 Gastro-esophageal reflux disease with esophagitis: Secondary | ICD-10-CM | POA: Insufficient documentation

## 2018-09-22 DIAGNOSIS — E782 Mixed hyperlipidemia: Secondary | ICD-10-CM | POA: Insufficient documentation

## 2018-09-22 DIAGNOSIS — Z7982 Long term (current) use of aspirin: Secondary | ICD-10-CM | POA: Insufficient documentation

## 2018-09-22 HISTORY — PX: CHOLECYSTECTOMY: SHX55

## 2018-09-22 SURGERY — LAPAROSCOPIC CHOLECYSTECTOMY
Anesthesia: General

## 2018-09-22 MED ORDER — LACTATED RINGERS IV SOLN: INTRAVENOUS | Status: DC

## 2018-09-22 MED ORDER — FENTANYL CITRATE (PF) 250 MCG/5ML IJ SOLN
INTRAMUSCULAR | Status: AC
  Filled 2018-09-22: qty 5

## 2018-09-22 MED ORDER — KETOROLAC TROMETHAMINE 30 MG/ML IJ SOLN
INTRAMUSCULAR | Status: AC
  Filled 2018-09-22: qty 1

## 2018-09-22 MED ORDER — SODIUM CHLORIDE 0.9 % IR SOLN
Status: DC | PRN
  Administered 2018-09-22: 1000 mL

## 2018-09-22 MED ORDER — SUGAMMADEX SODIUM 200 MG/2ML IV SOLN
INTRAVENOUS | Status: DC | PRN
  Administered 2018-09-22: 194.8 mg via INTRAVENOUS

## 2018-09-22 MED ORDER — ONDANSETRON HCL 4 MG/2ML IJ SOLN
INTRAMUSCULAR | Status: AC
  Filled 2018-09-22: qty 2

## 2018-09-22 MED ORDER — LACTATED RINGERS IV SOLN
INTRAVENOUS | Status: DC
  Administered 2018-09-22: 10:00:00 via INTRAVENOUS

## 2018-09-22 MED ORDER — SUGAMMADEX SODIUM 200 MG/2ML IV SOLN
INTRAVENOUS | Status: AC
  Filled 2018-09-22: qty 2

## 2018-09-22 MED ORDER — EPHEDRINE 5 MG/ML INJ
INTRAVENOUS | Status: AC
  Filled 2018-09-22: qty 10

## 2018-09-22 MED ORDER — MEPERIDINE HCL 50 MG/ML IJ SOLN: 6.2500 mg | INTRAMUSCULAR | Status: DC | PRN

## 2018-09-22 MED ORDER — EPHEDRINE SULFATE 50 MG/ML IJ SOLN
INTRAMUSCULAR | Status: DC | PRN
  Administered 2018-09-22: 10 mg via INTRAVENOUS

## 2018-09-22 MED ORDER — POVIDONE-IODINE 10 % EX OINT
TOPICAL_OINTMENT | CUTANEOUS | Status: AC
  Filled 2018-09-22: qty 1

## 2018-09-22 MED ORDER — CHLORHEXIDINE GLUCONATE CLOTH 2 % EX PADS: 6.0000 | MEDICATED_PAD | Freq: Once | CUTANEOUS | Status: DC

## 2018-09-22 MED ORDER — PROMETHAZINE HCL 25 MG/ML IJ SOLN: 6.2500 mg | INTRAMUSCULAR | Status: DC | PRN

## 2018-09-22 MED ORDER — BUPIVACAINE HCL (PF) 0.5 % IJ SOLN
INTRAMUSCULAR | Status: DC | PRN
  Administered 2018-09-22: 10 mL

## 2018-09-22 MED ORDER — OXYCODONE HCL 5 MG PO TABS: 5.0000 mg | ORAL_TABLET | ORAL | 0 refills | Status: DC | PRN

## 2018-09-22 MED ORDER — BUPIVACAINE LIPOSOME 1.3 % IJ SUSP
INTRAMUSCULAR | Status: AC
  Filled 2018-09-22: qty 20

## 2018-09-22 MED ORDER — HYDROMORPHONE HCL 1 MG/ML IJ SOLN
INTRAMUSCULAR | Status: AC
  Filled 2018-09-22: qty 0.5

## 2018-09-22 MED ORDER — GLYCOPYRROLATE PF 0.2 MG/ML IJ SOSY
PREFILLED_SYRINGE | INTRAMUSCULAR | Status: DC | PRN
  Administered 2018-09-22: .2 mg via INTRAVENOUS

## 2018-09-22 MED ORDER — LIDOCAINE 2% (20 MG/ML) 5 ML SYRINGE
INTRAMUSCULAR | Status: DC | PRN
  Administered 2018-09-22: 40 mg via INTRAVENOUS

## 2018-09-22 MED ORDER — SUCCINYLCHOLINE CHLORIDE 200 MG/10ML IV SOSY
PREFILLED_SYRINGE | INTRAVENOUS | Status: AC
  Filled 2018-09-22: qty 10

## 2018-09-22 MED ORDER — SUCCINYLCHOLINE CHLORIDE 20 MG/ML IJ SOLN
INTRAMUSCULAR | Status: DC | PRN
  Administered 2018-09-22: 160 mg via INTRAVENOUS

## 2018-09-22 MED ORDER — SODIUM CHLORIDE 0.9 % IV SOLN
2.0000 g | INTRAVENOUS | Status: AC
  Administered 2018-09-22: 2 g via INTRAVENOUS
  Filled 2018-09-22: qty 2

## 2018-09-22 MED ORDER — LIDOCAINE 2% (20 MG/ML) 5 ML SYRINGE
INTRAMUSCULAR | Status: AC
  Filled 2018-09-22: qty 5

## 2018-09-22 MED ORDER — ROCURONIUM BROMIDE 10 MG/ML (PF) SYRINGE
PREFILLED_SYRINGE | INTRAVENOUS | Status: AC
  Filled 2018-09-22: qty 10

## 2018-09-22 MED ORDER — BUPIVACAINE HCL (PF) 0.5 % IJ SOLN
INTRAMUSCULAR | Status: AC
  Filled 2018-09-22: qty 30

## 2018-09-22 MED ORDER — PROPOFOL 10 MG/ML IV BOLUS
INTRAVENOUS | Status: DC | PRN
  Administered 2018-09-22: 180 mg via INTRAVENOUS
  Administered 2018-09-22: 60 mg via INTRAVENOUS

## 2018-09-22 MED ORDER — HEMOSTATIC AGENTS (NO CHARGE) OPTIME
TOPICAL | Status: DC | PRN
  Administered 2018-09-22: 1 via TOPICAL

## 2018-09-22 MED ORDER — ROCURONIUM BROMIDE 50 MG/5ML IV SOSY
PREFILLED_SYRINGE | INTRAVENOUS | Status: DC | PRN
  Administered 2018-09-22: 30 mg via INTRAVENOUS

## 2018-09-22 MED ORDER — HYDROCODONE-ACETAMINOPHEN 7.5-325 MG PO TABS
1.0000 | ORAL_TABLET | Freq: Once | ORAL | Status: AC | PRN
  Administered 2018-09-22: 1 via ORAL
  Filled 2018-09-22: qty 1

## 2018-09-22 MED ORDER — FENTANYL CITRATE (PF) 100 MCG/2ML IJ SOLN
INTRAMUSCULAR | Status: DC | PRN
  Administered 2018-09-22 (×5): 50 ug via INTRAVENOUS

## 2018-09-22 MED ORDER — DEXAMETHASONE SODIUM PHOSPHATE 10 MG/ML IJ SOLN
INTRAMUSCULAR | Status: AC
  Filled 2018-09-22: qty 1

## 2018-09-22 MED ORDER — GLYCOPYRROLATE PF 0.2 MG/ML IJ SOSY
PREFILLED_SYRINGE | INTRAMUSCULAR | Status: AC
  Filled 2018-09-22: qty 1

## 2018-09-22 MED ORDER — KETOROLAC TROMETHAMINE 30 MG/ML IJ SOLN
30.0000 mg | Freq: Once | INTRAMUSCULAR | Status: AC
  Administered 2018-09-22: 30 mg via INTRAVENOUS

## 2018-09-22 MED ORDER — DOCUSATE SODIUM 100 MG PO CAPS: 100.0000 mg | ORAL_CAPSULE | Freq: Two times a day (BID) | ORAL | 2 refills | Status: DC

## 2018-09-22 MED ORDER — ARTIFICIAL TEARS OPHTHALMIC OINT
TOPICAL_OINTMENT | OPHTHALMIC | Status: AC
  Filled 2018-09-22: qty 3.5

## 2018-09-22 MED ORDER — HYDROMORPHONE HCL 1 MG/ML IJ SOLN
0.2500 mg | INTRAMUSCULAR | Status: DC | PRN
  Administered 2018-09-22 (×3): 0.5 mg via INTRAVENOUS
  Filled 2018-09-22 (×3): qty 0.5

## 2018-09-22 SURGICAL SUPPLY — 47 items
ADH SKN CLS APL DERMABOND .7 (GAUZE/BANDAGES/DRESSINGS) ×1
APPLIER CLIP ROT 10 11.4 M/L (STAPLE) ×2
APR CLP MED LRG 11.4X10 (STAPLE) ×1
BAG RETRIEVAL 10 (BASKET) ×1
BLADE SURG 15 STRL LF DISP TIS (BLADE) ×1 IMPLANT
BLADE SURG 15 STRL SS (BLADE) ×2
CHLORAPREP W/TINT 26ML (MISCELLANEOUS) ×2 IMPLANT
CLIP APPLIE ROT 10 11.4 M/L (STAPLE) ×1 IMPLANT
CLOTH BEACON ORANGE TIMEOUT ST (SAFETY) ×2 IMPLANT
COVER LIGHT HANDLE STERIS (MISCELLANEOUS) ×4 IMPLANT
COVER WAND RF STERILE (DRAPES) ×2 IMPLANT
DECANTER SPIKE VIAL GLASS SM (MISCELLANEOUS) ×2 IMPLANT
DERMABOND ADVANCED (GAUZE/BANDAGES/DRESSINGS) ×1
DERMABOND ADVANCED .7 DNX12 (GAUZE/BANDAGES/DRESSINGS) ×1 IMPLANT
ELECT REM PT RETURN 9FT ADLT (ELECTROSURGICAL) ×2
ELECTRODE REM PT RTRN 9FT ADLT (ELECTROSURGICAL) ×1 IMPLANT
FILTER SMOKE EVAC LAPAROSHD (FILTER) ×2 IMPLANT
GLOVE BIO SURGEON STRL SZ 6.5 (GLOVE) ×2 IMPLANT
GLOVE BIO SURGEON STRL SZ7.5 (GLOVE) ×1 IMPLANT
GLOVE BIOGEL PI IND STRL 6.5 (GLOVE) ×1 IMPLANT
GLOVE BIOGEL PI IND STRL 7.0 (GLOVE) ×3 IMPLANT
GLOVE BIOGEL PI INDICATOR 6.5 (GLOVE) ×1
GLOVE BIOGEL PI INDICATOR 7.0 (GLOVE) ×3
GLOVE ECLIPSE 6.5 STRL STRAW (GLOVE) ×1 IMPLANT
GLOVE SURG SS PI 7.5 STRL IVOR (GLOVE) ×1 IMPLANT
GOWN STRL REUS W/TWL LRG LVL3 (GOWN DISPOSABLE) ×6 IMPLANT
HEMOSTAT SNOW SURGICEL 2X4 (HEMOSTASIS) ×2 IMPLANT
INST SET LAPROSCOPIC AP (KITS) ×2 IMPLANT
KIT TURNOVER KIT A (KITS) ×2 IMPLANT
MANIFOLD NEPTUNE II (INSTRUMENTS) ×2 IMPLANT
NDL INSUFFLATION 14GA 120MM (NEEDLE) ×1 IMPLANT
NEEDLE INSUFFLATION 14GA 120MM (NEEDLE) ×2 IMPLANT
NS IRRIG 1000ML POUR BTL (IV SOLUTION) ×2 IMPLANT
PACK LAP CHOLE LZT030E (CUSTOM PROCEDURE TRAY) ×2 IMPLANT
PAD ARMBOARD 7.5X6 YLW CONV (MISCELLANEOUS) ×2 IMPLANT
SET BASIN LINEN APH (SET/KITS/TRAYS/PACK) ×2 IMPLANT
SLEEVE ENDOPATH XCEL 5M (ENDOMECHANICALS) ×2 IMPLANT
SUT MNCRL AB 4-0 PS2 18 (SUTURE) ×3 IMPLANT
SUT VICRYL 0 UR6 27IN ABS (SUTURE) ×2 IMPLANT
SYS BAG RETRIEVAL 10MM (BASKET) ×1
SYSTEM BAG RETRIEVAL 10MM (BASKET) ×1 IMPLANT
TROCAR ENDO BLADELESS 11MM (ENDOMECHANICALS) ×2 IMPLANT
TROCAR XCEL NON-BLD 5MMX100MML (ENDOMECHANICALS) ×2 IMPLANT
TROCAR XCEL UNIV SLVE 11M 100M (ENDOMECHANICALS) ×2 IMPLANT
TUBE CONNECTING 12X1/4 (SUCTIONS) ×2 IMPLANT
TUBING INSUFFLATION (TUBING) ×2 IMPLANT
WARMER LAPAROSCOPE (MISCELLANEOUS) ×2 IMPLANT

## 2018-09-22 NOTE — Anesthesia Procedure Notes (Signed)
Procedure Name: Intubation Date/Time: 09/22/2018 10:48 AM Performed by: Andree Elk, Amy A, CRNA Pre-anesthesia Checklist: Patient identified, Patient being monitored, Timeout performed, Emergency Drugs available and Suction available Patient Re-evaluated:Patient Re-evaluated prior to induction Oxygen Delivery Method: Circle system utilized Preoxygenation: Pre-oxygenation with 100% oxygen Induction Type: IV induction Ventilation: Mask ventilation without difficulty Laryngoscope Size: 3 and Miller Grade View: Grade II Tube type: Oral Tube size: 7.0 mm Number of attempts: 1 Airway Equipment and Method: Stylet Placement Confirmation: ETT inserted through vocal cords under direct vision,  positive ETCO2 and breath sounds checked- equal and bilateral Secured at: 21 cm Tube secured with: Tape Dental Injury: Teeth and Oropharynx as per pre-operative assessment

## 2018-09-22 NOTE — Transfer of Care (Signed)
Immediate Anesthesia Transfer of Care Note  [name]  Procedure(s) Performed: LAPAROSCOPIC CHOLECYSTECTOMY (N/A )  Patient Location: PACU  Anesthesia Type:General  Level of Consciousness: awake, alert , oriented and patient cooperative  Airway & Oxygen Therapy: Patient Spontanous Breathing  Post-op Assessment: Report given to RN and Post -op Vital signs reviewed and stable  Post vital signs: Reviewed and stable  Last Vitals:  Vitals Value Taken Time  BP 163/91 09/22/2018 11:56 AM  Temp 36.7 C 09/22/2018 11:56 AM  Pulse 59 09/22/2018 11:59 AM  Resp 16 09/22/2018 11:59 AM  SpO2 98 % 09/22/2018 11:59 AM  Vitals shown include unvalidated device data.  Last Pain:  Vitals:   09/22/18 1014  TempSrc: Oral  PainSc: 0-No pain      Patients Stated Pain Goal: 7 (99/24/26 8341)  Complications: No apparent anesthesia complications

## 2018-09-22 NOTE — Op Note (Addendum)
Operative Note   Preoperative Diagnosis: Symptomatic cholelithiasis   Postoperative Diagnosis: Same   Procedure(s) Performed: Laparoscopic cholecystectomy   Surgeon: Ria Comment C. Constance Haw, MD   Assistants: Aviva Signs, MD    Anesthesia: General endotracheal   Anesthesiologist: Vena Rua, MD    Specimens: Gallbladder    Estimated Blood Loss: 100cc    Blood Replacement: None    Complications: None    Operative Findings: Distended gallbladder   Procedure: The patient was taken to the operating room and placed supine. General endotracheal anesthesia was induced. Intravenous antibiotics were administered per protocol. An orogastric tube positioned to decompress the stomach. The abdomen was prepared and draped in the usual sterile fashion.    An umbilical incision was made at the level of his umbilical hernia defect and a Veress technique was utilized to achieve pneumoperitoneum to 15 mmHg with carbon dioxide. A 11 mm optiview port was placed through the supraumbilical region, and a 10 mm 0-degree operative laparoscope was introduced. The area underlying the trocar and Veress needle were inspected and without evidence of injury.  Remaining trocars were placed under direct vision. Two 5 mm ports were placed in the right abdomen, between the anterior axillary and midclavicular line.  A final 11 mm port was placed through the mid-epigastrium, near the falciform ligament.    The gallbladder fundus was elevated cephalad and the infundibulum was retracted to the patient's right. The gallbladder/cystic duct junction was skeletonized. The cystic artery noted in the triangle of Calot and was also skeletonized.  On skeletonizing the artery, a small arteriotomy was made and this caused some bleeding. This was controlled with clips, but the clips were not going across the whole artery adequately. We were able to dissect out the artery further, and get below the level of the arteriotomy which  was being held closed with the Clip applier and clip. The cystic duct was doubly clipped and divided to allow for better mobilization.  After this was dissected out, a clip was placed inferiorly and superiorly on the artery and bleeding was controlled. The cystic duct and artery were clearly going into the gallbladder and the hepatic bed was behind the structures as the critical view was obtained.    The gallbladder was then dissected from the liver bed with electrocautery. The specimen was placed in an Endopouch and was retrieved through the epigastric site.   Final inspection revealed acceptable hemostasis. Surgical Emogene Morgan was placed in the gallbladder bed. 0 Vicryl fascial sutures were used to close the epigastric and umbilical port sites. Trocars were removed and pneumoperitoneum was released. Skin incisions were closed with 4-0 Monocryl subcuticular sutures and Dermabond. The patient was awakened from anesthesia and extubated without complication.    Curlene Labrum, MD [facility] [address] [place], [place] [address] [phone] (office)

## 2018-09-22 NOTE — Interval H&P Note (Signed)
History and Physical Interval Note:  09/22/2018 10:18 AM  Brandon [name]  has presented today for surgery, with the diagnosis of cholelithiasis  The various methods of treatment have been discussed with the patient and family. After consideration of risks, benefits and other options for treatment, the patient has consented to  Procedure(s): LAPAROSCOPIC CHOLECYSTECTOMY (N/A) as a surgical intervention .  The patient's history has been reviewed, patient examined, no change in status, stable for surgery.  I have reviewed the patient's chart and labs.  Questions were answered to the patient's satisfaction.     Virl Cagey

## 2018-09-22 NOTE — Addendum Note (Signed)
Addendum  created 09/22/18 1214 by Mickel Baas, CRNA   Attestation recorded in [place] filed

## 2018-09-22 NOTE — Anesthesia Postprocedure Evaluation (Signed)
Anesthesia Post Note  [name]  Procedure(s) Performed: LAPAROSCOPIC CHOLECYSTECTOMY (N/A )  Patient location during evaluation: PACU Anesthesia Type: General Level of consciousness: awake and alert and oriented Pain management: pain level controlled Vital Signs Assessment: post-procedure vital signs reviewed and stable Respiratory status: spontaneous breathing Cardiovascular status: stable Postop Assessment: no apparent nausea or vomiting Anesthetic complications: no     Last Vitals:  Vitals:   09/22/18 1156 09/22/18 1200  BP: (!) 163/91   Pulse: 65 63  Resp: 16 18  Temp: 36.7 C   SpO2: 97% 98%    Last Pain:  Vitals:   09/22/18 1156  TempSrc:   PainSc: 6                  Roderica Cathell A

## 2018-09-22 NOTE — Discharge Instructions (Signed)
Discharge Laparoscopic Surgery Instructions:  Common Complaints: Right shoulder pain is common after laparoscopic surgery. This is secondary to the gas used in the surgery being trapped under the diaphragm.  Walk to help your body absorb the gas. This will improve in a few days. Pain at the port sites are common, especially the larger port sites. This will improve with time.  Some nausea is common and poor appetite. The main goal is to stay hydrated the first few days after surgery.   Diet/ Activity: Diet as tolerated. You may not have an appetite, but it is important to stay hydrated. Drink 64 ounces of water a day. Your appetite will return with time.  Shower per your regular routine daily.  Do not take hot showers. Take warm showers that are less than 10 minutes. Rest and listen to your body, but do not remain in bed all day.  Walk everyday for at least 15-20 minutes. Deep cough and move around every 1-2 hours in the first few days after surgery.  Do not lift > 10 lbs, perform excessive bending, pushing, pulling, squatting for 1-2 weeks after surgery.  Do not pick at the dermabond glue on your incision sites.  This glue film will remain in place for 1-2 weeks and will start to peel off.  Do not place lotions or balms on your incision unless instructed to specifically by Dr. Constance Haw.   Medication: Take tylenol and ibuprofen as needed for pain control, alternating every 4-6 hours.  Example:  Tylenol 1000mg  @ 6am, 12noon, 6pm, 13midnight (Do not exceed 4000mg  of tylenol a day). Ibuprofen 800mg  @ 9am, 3pm, 9pm, 3am (Do not exceed 3600mg  of ibuprofen a day).  Take Roxicodone for breakthrough pain every 4 hours.  Take Colace for constipation related to narcotic pain medication. If you do not have a bowel movement in 2 days, take Miralax over the counter.  Drink plenty of water to also prevent constipation.   Contact Information: If you have questions or concerns, please call our office,  [phone], Monday- Thursday 8AM-5PM and Friday 8AM-12Noon.  If it is after hours or on the weekend, please call [facility] Main [facility], [phone], and ask to speak to the surgeon on call for Dr. Constance Haw at [facility].    Laparoscopic Cholecystectomy, Care After This sheet gives you information about how to care for yourself after your procedure. Your doctor may also give you more specific instructions. If you have problems or questions, contact your doctor. Follow these instructions at home: Care for cuts from surgery (incisions)   Follow instructions from your doctor about how to take care of your cuts from surgery. Make sure you: ? Wash your hands with soap and water before you change your bandage (dressing). If you cannot use soap and water, use hand sanitizer. ? Change your bandage as told by your doctor.  Leave stitches (sutures), skin glue, or skin tape (adhesive) strips in place.   Do not take baths, swim, or use a hot tub until your doctor says it is okay.   You may shower.  Check your surgical cut area every day for signs of infection. Check for: ? More redness, swelling, or pain. ? More fluid or blood. ? Warmth. ? Pus or a bad smell. Activity  Do not drive or use heavy machinery while taking prescription pain medicine.  Do not lift anything that is heavier than 10 lb (4.5 kg) until your doctor says it is okay.  Do not play contact sports until  your doctor says it is okay.  Do not drive for 24 hours if you were given a medicine to help you relax (sedative).  Rest as needed. Do not return to work or school until your doctor says it is okay. General instructions  Take over-the-counter and prescription medicines only as told by your doctor.  To prevent or treat constipation while you are taking prescription pain medicine, your doctor may recommend that you: ? Drink enough fluid to keep your pee (urine) clear or pale yellow. ? Take over-the-counter or prescription  medicines. ? Eat foods that are high in fiber, such as fresh fruits and vegetables, whole grains, and beans. ? Limit foods that are high in fat and processed sugars, such as fried and sweet foods. Contact a doctor if:  You develop a rash.  You have more redness, swelling, or pain around your surgical cuts.  You have more fluid or blood coming from your surgical cuts.  Your surgical cuts feel warm to the touch.  You have pus or a bad smell coming from your surgical cuts.  You have a fever.  One or more of your surgical cuts breaks open. Get help right away if:  You have trouble breathing.  You have chest pain.  You have pain that is getting worse in your shoulders.  You faint or feel dizzy when you stand.  You have very bad pain in your belly (abdomen).  You are sick to your stomach (nauseous) for more than one day.  You have throwing up (vomiting) that lasts for more than one day.  You have leg pain. This information is not intended to replace advice given to you by your health care provider. Make sure you discuss any questions you have with your health care provider. Document Released: 05/11/2008 Document Revised: 02/21/2016 Document Reviewed: 01/19/2016 Elsevier Interactive Patient Education  2019 [employer].

## 2018-09-22 NOTE — Anesthesia Preprocedure Evaluation (Signed)
Anesthesia Evaluation    Airway Mallampati: II       Dental  (+) Teeth Intact, Dental Advidsory Given   Pulmonary former smoker,    breath sounds clear to auscultation       Cardiovascular hypertension, On Medications + CAD   Rhythm:regular     Neuro/Psych    GI/Hepatic GERD  Medicated,  Endo/Other    Renal/GU Renal disease     Musculoskeletal   Abdominal   Peds  Hematology   Anesthesia Other Findings   Reproductive/Obstetrics                             Anesthesia Physical Anesthesia Plan  ASA: III  Anesthesia Plan: General   Post-op Pain Management:    Induction:   PONV Risk Score and Plan:   Airway Management Planned:   Additional Equipment:   Intra-op Plan:   Post-operative Plan:   Informed Consent: I have reviewed the patients History and Physical, chart, labs and discussed the procedure including the risks, benefits and alternatives for the proposed anesthesia with the patient or authorized representative who has indicated his/her understanding and acceptance.       Plan Discussed with: Anesthesiologist  Anesthesia Plan Comments:         Anesthesia Quick Evaluation

## 2018-09-25 ENCOUNTER — Encounter (HOSPITAL_COMMUNITY): Payer: Self-pay | Admitting: General Surgery

## 2018-10-03 ENCOUNTER — Encounter (INDEPENDENT_AMBULATORY_CARE_PROVIDER_SITE_OTHER): Payer: Self-pay | Admitting: Internal Medicine

## 2018-10-03 ENCOUNTER — Ambulatory Visit (INDEPENDENT_AMBULATORY_CARE_PROVIDER_SITE_OTHER): Payer: Medicare Other | Admitting: Internal Medicine

## 2018-10-03 VITALS — BP 142/84 | HR 57 | Temp 98.2°F | Ht 73.0 in | Wt 209.6 lb

## 2018-10-03 DIAGNOSIS — K219 Gastro-esophageal reflux disease without esophagitis: Secondary | ICD-10-CM | POA: Diagnosis not present

## 2018-10-03 NOTE — Patient Instructions (Signed)
Continue the Nexium. OV in 1 year. 

## 2018-10-03 NOTE — Progress Notes (Signed)
Subjective:    Patient ID: Brandon [name], male    DOB: 1945-11-05, 73 y.o.   MRN: [id number]  HPI Here today for f/u. Underwent an EGD 08/03/2018  (abdominal pain, nausea, bloating). which revealed normal mid esophagus, LA grade Reflux esophagitis. 3 cm hiatal hernia. Erosive gastropathy. Normal duodenal bulb and second portion of duodenum.  Negative for H. Pylori. Reactive changes most likely due to Meloxicam.  Underwent a laparoscopic cholecystectomy 09/22/2018 by Dr. Constance Haw (symptomatic cholelithiasis). He states he is doing better. He has less nausea. Appetite is good. He is eating chicken and fish.  Has not been taking the Meloxicam. BMs are normal.    Review of Systems Past Medical History:  Diagnosis Date  . Cataract   . Coronary atherosclerosis of native coronary artery    Multivessel status post CABG March 2015  . DDD (degenerative disc disease), lumbar    Dr. Carloyn Manner  . Essential hypertension, benign   . GERD (gastroesophageal reflux disease)   . Hard of hearing   . Hemorrhoids   . History of gout   . History of kidney stones   . History of [facility] spotted fever   . Lumbago   . Mixed hyperlipidemia   . Renal calculus or stone 05/22/14    Past Surgical History:  Procedure Laterality Date  . AMPUTATION Right 03/27/2014   Procedure: DEBRIDEMENT AND CLOSURE RIGHT INDEX FINGER;  Surgeon: [name], MD;  Location: [place];  Service: Orthopedics;  Laterality: Right;  . BACK SURGERY    . BICEPT TENODESIS  11/26/2011   Procedure: BICEPT TENODESIS;  Surgeon: Augustin Schooling, MD;  Location: [place];  Service: Orthopedics;  Laterality: Left;  . BIOPSY  08/03/2018   Procedure: BIOPSY;  Surgeon: Rogene Houston, MD;  [facility];  Service: Endoscopy;;  antral  . Cataract surgery Left   . CHOLECYSTECTOMY N/A 09/22/2018   Procedure: LAPAROSCOPIC CHOLECYSTECTOMY;  Surgeon: Virl Cagey, MD;  Location: [facility];  Service: General;  Laterality: N/A;  .  COLONOSCOPY    . COLONOSCOPY N/A 12/26/2014   Procedure: COLONOSCOPY;  Surgeon: Rogene Houston, MD;  [facility];  Service: Endoscopy;  Laterality: N/A;  730  . CORONARY ARTERY BYPASS GRAFT N/A 10/19/2013   Procedure: CORONARY ARTERY BYPASS GRAFTING (CABG);  Surgeon: Rexene Alberts, MD;  Location: [place];  Service: Open Heart Surgery;  Laterality: N/A;  CABG times four using left internal mammary artery and left leg saphenous vein, incision made on right leg but no vein removed  . CYSTOSCOPY WITH RETROGRADE PYELOGRAM, URETEROSCOPY AND STENT PLACEMENT Left 08/24/2016   Procedure: CYSTOSCOPY WITH LEFT RETROGRADE PYELOGRAM, LEFT URETEROSCOPY, BASKET EXTRACTION LEFT URETERAL STONE;  Surgeon: Irine Seal, MD;  Location: [facility];  Service: Urology;  Laterality: Left;  . ESOPHAGOGASTRODUODENOSCOPY N/A 08/03/2018   Procedure: ESOPHAGOGASTRODUODENOSCOPY (EGD);  Surgeon: Rogene Houston, MD;  [facility];  Service: Endoscopy;  Laterality: N/A;  2:55  . INTRAOPERATIVE TRANSESOPHAGEAL ECHOCARDIOGRAM N/A 10/19/2013   Procedure: INTRAOPERATIVE TRANSESOPHAGEAL ECHOCARDIOGRAM;  Surgeon: Rexene Alberts, MD;  Location: [place];  Service: Open Heart Surgery;  Laterality: N/A;  . JOINT REPLACEMENT    . LEFT HEART CATHETERIZATION WITH CORONARY ANGIOGRAM N/A 10/09/2013   Procedure: LEFT HEART CATHETERIZATION WITH CORONARY ANGIOGRAM;  Surgeon: Burnell Blanks, MD;  Location: [facility] CATH LAB;  Service: Cardiovascular;  Laterality: N/A;  . Left shoulder rotator cuff repair    . LITHOTRIPSY    . TOTAL  KNEE ARTHROPLASTY Left   . VASECTOMY      No Known Allergies  Current Outpatient Medications on File Prior to Visit  Medication Sig Dispense Refill  . acetaminophen (TYLENOL) 500 MG tablet Take 500-1,000 mg by mouth every 6 (six) hours as needed (for pain.). Take 2 tablets (1000 mg) by mouth in the morning    . allopurinol (ZYLOPRIM) 300 MG tablet Take 300 mg by mouth daily.    . Ascorbic Acid  (VITAMIN C) 1000 MG tablet Take 1,000 mg by mouth daily.    Marland Kitchen aspirin EC 81 MG tablet Take 1 tablet (81 mg total) by mouth daily. 90 tablet 3  . esomeprazole (NEXIUM) 40 MG capsule Take 1 capsule (40 mg total) by mouth daily before breakfast. 30 capsule 5  . famotidine (PEPCID) 20 MG tablet Take 1 tablet (20 mg total) by mouth at bedtime.    . Glucosamine-Chondroitin (OSTEO BI-FLEX REGULAR STRENGTH PO) Take 1 tablet by mouth daily.    . hydrochlorothiazide (HYDRODIURIL) 50 MG tablet Take 50 mg by mouth daily.    . meloxicam (MOBIC) 7.5 MG tablet Take 7.5 mg by mouth at bedtime as needed for pain.     . metoprolol tartrate (LOPRESSOR) 25 MG tablet Take 25 mg by mouth 2 (two) times daily.     . Multiple Vitamin (MULITIVITAMIN WITH MINERALS) TABS Take 1 tablet by mouth daily.    . nitroGLYCERIN (NITROSTAT) 0.4 MG SL tablet Place 1 tablet (0.4 mg total) under the tongue every 5 (five) minutes as needed. 25 tablet 3  . Omega-3 Fatty Acids (FISH OIL) 1200 MG CAPS Take 1,200 mg by mouth daily.    Marland Kitchen oxyCODONE (ROXICODONE) 5 MG immediate release tablet Take 1 tablet (5 mg total) by mouth every 4 (four) hours as needed. 30 tablet 0  . potassium chloride (K-DUR,KLOR-CON) 10 MEQ tablet Take 10 mEq by mouth daily.    . pravastatin (PRAVACHOL) 40 MG tablet Take 40 mg by mouth every evening.     . tamsulosin (FLOMAX) 0.4 MG CAPS capsule Take 0.4 mg by mouth at bedtime.      No current facility-administered medications on file prior to visit.         Objective:   Physical Exam Blood pressure (!) 142/84, pulse (!) 57, temperature 98.2 F (36.8 C), height 6\' 1"  (1.854 m), weight 209 lb 9.6 oz (95.1 kg).  Alert and oriented. Skin warm and dry. Oral mucosa is moist.   . Sclera anicteric, conjunctivae is pink. Thyroid not enlarged. No cervical lymphadenopathy. Lungs clear. Heart regular rate and rhythm.  Abdomen is soft. Bowel sounds are positive. No hepatomegaly. No abdominal masses felt. No tenderness.  No  edema to lower extremities.         Assessment & Plan:  GERD.  He will continue the Nexium. OV in 1 year.

## 2018-10-05 ENCOUNTER — Ambulatory Visit (INDEPENDENT_AMBULATORY_CARE_PROVIDER_SITE_OTHER): Payer: Self-pay | Admitting: General Surgery

## 2018-10-05 ENCOUNTER — Encounter: Payer: Self-pay | Admitting: General Surgery

## 2018-10-05 VITALS — BP 132/81 | HR 58 | Temp 97.7°F | Resp 18 | Wt 212.8 lb

## 2018-10-05 DIAGNOSIS — K8020 Calculus of gallbladder without cholecystitis without obstruction: Secondary | ICD-10-CM

## 2018-10-05 NOTE — Patient Instructions (Signed)
Diet and activity as tolerated.

## 2018-10-05 NOTE — Progress Notes (Signed)
[facility] Note   HPI:  73 y.o. Male presents to clinic for post-op follow-up evaluation after laparoscopic cholecystectomy. Patient reports he is doing well. He is up and moving around. He has started back work slowly. He is eating and drinking and pooping.   Review of Systems:  Regular Bms, soft Good appetite  All other review of systems: otherwise negative   Pathology: Diagnosis Gallbladder - CHRONIC CHOLECYSTITIS AND CHOLELITHIASIS.  Vital Signs:  BP 132/81 (BP Location: Left Arm, Patient Position: Sitting, Cuff Size: Normal)   Pulse (!) 58   Temp 97.7 F (36.5 C) (Temporal)   Resp 18   Wt 212 lb 12.8 oz (96.5 kg)   BMI 28.08 kg/m    Physical Exam:  Physical Exam HENT:     Head: Normocephalic.  Cardiovascular:     Rate and Rhythm: Normal rate.  Pulmonary:     Effort: Pulmonary effort is normal.  Abdominal:     General: There is no distension.     Palpations: Abdomen is soft.     Tenderness: There is no abdominal tenderness.     Comments: Port sites healing, dermabond peeling, no erythema or drainage  Neurological:     Mental Status: He is alert.     Assessment:  73 y.o. yo Male s/p laparoscopic cholecystectomy. Doing well.  Plan:  Diet and activity as tolerated.  Follow up PRN   All of the above recommendations were discussed with the patient and patient's family, and all of patient's and family's questions were answered to their expressed satisfaction.  Curlene Labrum, MD [facility] [address] [place], [place] [address] [phone] (office)

## 2018-10-06 DIAGNOSIS — I2510 Atherosclerotic heart disease of native coronary artery without angina pectoris: Secondary | ICD-10-CM | POA: Diagnosis not present

## 2018-10-06 DIAGNOSIS — M722 Plantar fascial fibromatosis: Secondary | ICD-10-CM | POA: Diagnosis not present

## 2018-10-06 DIAGNOSIS — I10 Essential (primary) hypertension: Secondary | ICD-10-CM | POA: Diagnosis not present

## 2018-10-06 DIAGNOSIS — W57XXXA Bitten or stung by nonvenomous insect and other nonvenomous arthropods, initial encounter: Secondary | ICD-10-CM | POA: Diagnosis not present

## 2018-10-06 DIAGNOSIS — Z8739 Personal history of other diseases of the musculoskeletal system and connective tissue: Secondary | ICD-10-CM | POA: Diagnosis not present

## 2018-10-06 DIAGNOSIS — I119 Hypertensive heart disease without heart failure: Secondary | ICD-10-CM | POA: Diagnosis not present

## 2018-10-06 DIAGNOSIS — R0781 Pleurodynia: Secondary | ICD-10-CM | POA: Diagnosis not present

## 2018-10-06 DIAGNOSIS — E785 Hyperlipidemia, unspecified: Secondary | ICD-10-CM | POA: Diagnosis not present

## 2018-12-25 DIAGNOSIS — I2510 Atherosclerotic heart disease of native coronary artery without angina pectoris: Secondary | ICD-10-CM | POA: Diagnosis not present

## 2018-12-25 DIAGNOSIS — M1990 Unspecified osteoarthritis, unspecified site: Secondary | ICD-10-CM | POA: Diagnosis not present

## 2018-12-25 DIAGNOSIS — E785 Hyperlipidemia, unspecified: Secondary | ICD-10-CM | POA: Diagnosis not present

## 2018-12-25 DIAGNOSIS — I10 Essential (primary) hypertension: Secondary | ICD-10-CM | POA: Diagnosis not present

## 2019-01-24 DIAGNOSIS — M19021 Primary osteoarthritis, right elbow: Secondary | ICD-10-CM | POA: Diagnosis not present

## 2019-02-23 DIAGNOSIS — Z961 Presence of intraocular lens: Secondary | ICD-10-CM | POA: Diagnosis not present

## 2019-02-23 DIAGNOSIS — H259 Unspecified age-related cataract: Secondary | ICD-10-CM | POA: Diagnosis not present

## 2019-02-23 DIAGNOSIS — H02834 Dermatochalasis of left upper eyelid: Secondary | ICD-10-CM | POA: Diagnosis not present

## 2019-02-23 DIAGNOSIS — H02831 Dermatochalasis of right upper eyelid: Secondary | ICD-10-CM | POA: Diagnosis not present

## 2019-03-05 DIAGNOSIS — Z961 Presence of intraocular lens: Secondary | ICD-10-CM | POA: Diagnosis not present

## 2019-03-05 DIAGNOSIS — H2589 Other age-related cataract: Secondary | ICD-10-CM | POA: Diagnosis not present

## 2019-03-11 DIAGNOSIS — H2512 Age-related nuclear cataract, left eye: Secondary | ICD-10-CM | POA: Diagnosis not present

## 2019-03-15 DIAGNOSIS — H2511 Age-related nuclear cataract, right eye: Secondary | ICD-10-CM | POA: Diagnosis not present

## 2019-03-15 DIAGNOSIS — H52221 Regular astigmatism, right eye: Secondary | ICD-10-CM | POA: Diagnosis not present

## 2019-03-30 DIAGNOSIS — I10 Essential (primary) hypertension: Secondary | ICD-10-CM | POA: Diagnosis not present

## 2019-03-30 DIAGNOSIS — E785 Hyperlipidemia, unspecified: Secondary | ICD-10-CM | POA: Diagnosis not present

## 2019-03-30 DIAGNOSIS — K921 Melena: Secondary | ICD-10-CM | POA: Diagnosis not present

## 2019-03-30 DIAGNOSIS — E7521 Fabry (-Anderson) disease: Secondary | ICD-10-CM | POA: Diagnosis not present

## 2019-04-02 NOTE — Progress Notes (Signed)
Cardiology Office Note  Date: 04/03/2019   ID: Arris, Meyn February 01, 1946, MRN [id number]  PCP:  Sinda Du, MD  Cardiologist:  Rozann Lesches, MD Electrophysiologist:  None   Chief Complaint  Patient presents with   Cardiac follow-up    History of Present Illness: Brandon [name] is a 73 y.o. male last seen in August 2019.  He presents for a routine visit.  Still working in [employer], although states that things have been very slow lately.  He does not report any angina symptoms or nitroglycerin use with his activities.  He has had stable NYHA class II dyspnea.  I reviewed his cardiac regimen, stable and outlined below.  He does need a refill for a fresh bottle of nitroglycerin.  He is due for a physical with lab work per Dr. Luan Pulling.  Past Medical History:  Diagnosis Date   Cataract    Coronary atherosclerosis of native coronary artery    Multivessel status post CABG March 2015   DDD (degenerative disc disease), lumbar    Dr. Carloyn Manner   Essential hypertension, benign    GERD (gastroesophageal reflux disease)    Hard of hearing    Hemorrhoids    History of gout    History of kidney stones    History of [place] spotted fever    Lumbago    Mixed hyperlipidemia    Renal calculus or stone 05/22/14    Past Surgical History:  Procedure Laterality Date   AMPUTATION Right 03/27/2014   Procedure: DEBRIDEMENT AND CLOSURE RIGHT INDEX FINGER;  Surgeon: [name], MD;  Location: [place];  Service: Orthopedics;  Laterality: Right;   BACK SURGERY     BICEPT TENODESIS  11/26/2011   Procedure: BICEPT TENODESIS;  Surgeon: Augustin Schooling, MD;  Location: [place];  Service: Orthopedics;  Laterality: Left;   BIOPSY  08/03/2018   Procedure: BIOPSY;  Surgeon: Rogene Houston, MD;  [facility];  Service: Endoscopy;;  antral   Cataract surgery Left    CHOLECYSTECTOMY N/A 09/22/2018   Procedure: LAPAROSCOPIC CHOLECYSTECTOMY;   Surgeon: Virl Cagey, MD;  [facility];  Service: General;  Laterality: N/A;   COLONOSCOPY     COLONOSCOPY N/A 12/26/2014   Procedure: COLONOSCOPY;  Surgeon: Rogene Houston, MD;  [facility];  Service: Endoscopy;  Laterality: N/A;  730   CORONARY ARTERY BYPASS GRAFT N/A 10/19/2013   Procedure: CORONARY ARTERY BYPASS GRAFTING (CABG);  Surgeon: Rexene Alberts, MD;  Location: [place];  Service: Open Heart Surgery;  Laterality: N/A;  CABG times four using left internal mammary artery and left leg saphenous vein, incision made on right leg but no vein removed   CYSTOSCOPY WITH RETROGRADE PYELOGRAM, URETEROSCOPY AND STENT PLACEMENT Left 08/24/2016   Procedure: CYSTOSCOPY WITH LEFT RETROGRADE PYELOGRAM, LEFT URETEROSCOPY, BASKET EXTRACTION LEFT URETERAL STONE;  Surgeon: Irine Seal, MD;  [facility];  Service: Urology;  Laterality: Left;   ESOPHAGOGASTRODUODENOSCOPY N/A 08/03/2018   Procedure: ESOPHAGOGASTRODUODENOSCOPY (EGD);  Surgeon: Rogene Houston, MD;  [facility];  Service: Endoscopy;  Laterality: N/A;  2:55   INTRAOPERATIVE TRANSESOPHAGEAL ECHOCARDIOGRAM N/A 10/19/2013   Procedure: INTRAOPERATIVE TRANSESOPHAGEAL ECHOCARDIOGRAM;  Surgeon: Rexene Alberts, MD;  Location: [place];  Service: Open Heart Surgery;  Laterality: N/A;   JOINT REPLACEMENT     LEFT HEART CATHETERIZATION WITH CORONARY ANGIOGRAM N/A 10/09/2013   Procedure: LEFT HEART CATHETERIZATION WITH CORONARY ANGIOGRAM;  Surgeon: Burnell Blanks,  MD;  Location: [place] [facility];  Service: Cardiovascular;  Laterality: N/A;   Left shoulder rotator cuff repair     LITHOTRIPSY     TOTAL KNEE ARTHROPLASTY Left    VASECTOMY      Current Outpatient Medications  Medication Sig Dispense Refill   acetaminophen (TYLENOL) 500 MG tablet Take 500-1,000 mg by mouth every 6 (six) hours as needed (for pain.). Take 2 tablets (1000 mg) by mouth in the morning     allopurinol (ZYLOPRIM) 300 MG tablet Take  300 mg by mouth daily.     Ascorbic Acid (VITAMIN C) 1000 MG tablet Take 1,000 mg by mouth daily.     aspirin EC 81 MG tablet Take 1 tablet (81 mg total) by mouth daily. 90 tablet 3   esomeprazole (NEXIUM) 40 MG capsule Take 1 capsule (40 mg total) by mouth daily before breakfast. 30 capsule 5   famotidine (PEPCID) 20 MG tablet Take 1 tablet (20 mg total) by mouth at bedtime.     Glucosamine-Chondroitin (OSTEO BI-FLEX REGULAR STRENGTH PO) Take 1 tablet by mouth daily.     hydrochlorothiazide (HYDRODIURIL) 50 MG tablet Take 50 mg by mouth daily.     meloxicam (MOBIC) 7.5 MG tablet Take 7.5 mg by mouth at bedtime as needed for pain.      metoprolol tartrate (LOPRESSOR) 25 MG tablet Take 25 mg by mouth 2 (two) times daily.      Multiple Vitamin (MULITIVITAMIN WITH MINERALS) TABS Take 1 tablet by mouth daily.     nitroGLYCERIN (NITROSTAT) 0.4 MG SL tablet Place 1 tablet (0.4 mg total) under the tongue every 5 (five) minutes as needed. 25 tablet 3   Omega-3 Fatty Acids (FISH OIL) 1200 MG CAPS Take 500 mg by mouth daily.      potassium chloride (K-DUR,KLOR-CON) 10 MEQ tablet Take 10 mEq by mouth daily.     pravastatin (PRAVACHOL) 40 MG tablet Take 40 mg by mouth every evening.      tamsulosin (FLOMAX) 0.4 MG CAPS capsule Take 0.4 mg by mouth at bedtime.      No current facility-administered medications for this visit.    Allergies:  Patient has no known allergies.   Social History: The patient  reports that he quit smoking about 35 years ago. His smoking use included cigarettes. He started smoking about 60 years ago. He has a 60.00 pack-year smoking history. He has quit using smokeless tobacco.  His smokeless tobacco use included chew. He reports current alcohol use. He reports that he does not use drugs.   ROS:  Please see the history of present illness. Otherwise, complete review of systems is positive for chronic right knee and right elbow discomfort.  All other systems are reviewed  and negative.   Physical Exam: VS:  BP 120/73    Pulse (!) 54    Temp 97.6 F (36.4 C)    Ht 6\' 1"  (1.854 m)    Wt 211 lb (95.7 kg)    BMI 27.84 kg/m , BMI Body mass index is 27.84 kg/m.  Wt Readings from Last 3 Encounters:  04/03/19 211 lb (95.7 kg)  10/05/18 212 lb 12.8 oz (96.5 kg)  10/03/18 209 lb 9.6 oz (95.1 kg)    General: Patient appears comfortable at rest. HEENT: Conjunctiva and lids normal, wearing a mask. Neck: Supple, no elevated JVP or carotid bruits, no thyromegaly. Lungs: Clear to auscultation, nonlabored breathing at rest. Cardiac: Regular rate and rhythm, no S3 or significant systolic murmur. Abdomen:  Soft, nontender, bowel sounds present. Extremities: No pitting edema, distal pulses 2+. Skin: Warm and dry. Musculoskeletal: No kyphosis. Neuropsychiatric: Alert and oriented x3, affect grossly appropriate.  ECG:  An ECG dated 03/28/2018 was personally reviewed today and demonstrated:  Sinus bradycardia with old inferior infarct pattern and nonspecific T wave changes.  Recent Labwork: 09/21/2018: BUN 19; Creatinine, Ser 1.05; Hemoglobin 14.6; Platelets 236; Potassium 3.2; Sodium 137  August 2019: BUN 19, creatinine 0.86, potassium 4.3  Other Studies Reviewed Today:  Echocardiogram 09/19/2013: Study Conclusions  - Left ventricle: Normal left ventricular outflow tract gradient. Wall thickness was increased in a pattern of severe LVH. Systolic function was normal. The estimated ejection fraction was in the range of 60% to 65%. Wall motion was normal; there were no regional wall motion abnormalities. There was an increased relative contribution of atrial contraction to ventricular filling. Doppler parameters are consistent with abnormal left ventricular relaxation (grade 1 diastolic dysfunction). There was no evidence of elevated ventricular filling pressure by Doppler parameters. - Aortic valve: Trileaflet; mildly thickened leaflets.  There was no stenosis. - Mitral valve: No systolic motion of the anterior mitral leaflet was noted. A small torn chord was appreciated, with no evidence of mitral regurgitation. Calcified annulus. - Left atrium: The atrium was mildly dilated.  Assessment and Plan:  1.  Multivessel CAD status post CABG in March 2015.  He continues to do well without active angina and describes activities exceeding 4 METS without significant limitation.  We will continue with observation on medical therapy at this time.  He remains on aspirin and statin.  Refill provided for fresh bottle of nitroglycerin.  2.  Mixed hyperlipidemia on Pravachol.  He is due for follow-up with Dr. Luan Pulling with repeat lipid panel.  3.  Essential hypertension, blood pressure is well controlled today.  He continues on HCTZ and Lopressor.  4.  Arthritic right knee and right elbow pain.  He states that he has regular follow-up with orthopedics, has been told that he may ultimately need joint replacements, but for now he is deferring any surgical intervention.  Medication Adjustments/Labs and Tests Ordered: Current medicines are reviewed at length with the patient today.  Concerns regarding medicines are outlined above.   Tests Ordered: No orders of the defined types were placed in this encounter.   Medication Changes: Meds ordered this encounter  Medications   nitroGLYCERIN (NITROSTAT) 0.4 MG SL tablet    Sig: Place 1 tablet (0.4 mg total) under the tongue every 5 (five) minutes as needed.    Dispense:  25 tablet    Refill:  3    Disposition:  Follow up 1 year in the [place] office.  Signed, Satira Sark, MD, [facility] 04/03/2019 8:40 AM    [place] at [facility] [address], [place], [place] [address] Phone: [phone]; Fax: [phone]

## 2019-04-03 ENCOUNTER — Other Ambulatory Visit: Payer: Self-pay

## 2019-04-03 ENCOUNTER — Encounter: Payer: Self-pay | Admitting: Cardiology

## 2019-04-03 ENCOUNTER — Ambulatory Visit (INDEPENDENT_AMBULATORY_CARE_PROVIDER_SITE_OTHER): Payer: Medicare Other | Admitting: Cardiology

## 2019-04-03 VITALS — BP 120/73 | HR 54 | Temp 97.6°F | Ht 73.0 in | Wt 211.0 lb

## 2019-04-03 DIAGNOSIS — I25119 Atherosclerotic heart disease of native coronary artery with unspecified angina pectoris: Secondary | ICD-10-CM

## 2019-04-03 DIAGNOSIS — I10 Essential (primary) hypertension: Secondary | ICD-10-CM

## 2019-04-03 DIAGNOSIS — E782 Mixed hyperlipidemia: Secondary | ICD-10-CM | POA: Diagnosis not present

## 2019-04-03 MED ORDER — NITROGLYCERIN 0.4 MG SL SUBL: 0.4000 mg | SUBLINGUAL_TABLET | SUBLINGUAL | 3 refills | Status: DC | PRN

## 2019-04-03 NOTE — Patient Instructions (Signed)
Medication Instructions: Your physician recommends that you continue on your current medications as directed. Please refer to the Current Medication list given to you today.   Labwork: none  Procedures/Testing: none  Follow-Up: 1 year with Dr.McDowell  Any Additional Special Instructions Will Be Listed Below (If Applicable).     If you need a refill on your cardiac medications before your next appointment, please call your pharmacy.     Thank you for choosing [facility] !

## 2019-06-11 DIAGNOSIS — N202 Calculus of kidney with calculus of ureter: Secondary | ICD-10-CM | POA: Diagnosis not present

## 2019-06-11 DIAGNOSIS — R310 Gross hematuria: Secondary | ICD-10-CM | POA: Diagnosis not present

## 2019-06-11 DIAGNOSIS — N132 Hydronephrosis with renal and ureteral calculous obstruction: Secondary | ICD-10-CM | POA: Diagnosis not present

## 2019-06-12 ENCOUNTER — Other Ambulatory Visit: Payer: Self-pay | Admitting: Urology

## 2019-06-13 ENCOUNTER — Encounter (HOSPITAL_COMMUNITY): Payer: Self-pay | Admitting: General Practice

## 2019-06-18 ENCOUNTER — Encounter (HOSPITAL_COMMUNITY): Payer: Self-pay | Admitting: General Practice

## 2019-06-18 ENCOUNTER — Ambulatory Visit (HOSPITAL_COMMUNITY)
Admission: RE | Admit: 2019-06-18 | Discharge: 2019-06-18 | Disposition: A | Discharge status: Home / Self Care | Payer: Medicare Other | Source: Ambulatory Visit | Attending: Urology | Admitting: Urology

## 2019-06-18 ENCOUNTER — Encounter (HOSPITAL_COMMUNITY)
Admission: RE | Disposition: A | Discharge status: Home / Self Care | Payer: Self-pay | Source: Ambulatory Visit | Attending: Urology

## 2019-06-18 ENCOUNTER — Ambulatory Visit (HOSPITAL_COMMUNITY): Payer: Medicare Other

## 2019-06-18 DIAGNOSIS — I209 Angina pectoris, unspecified: Secondary | ICD-10-CM | POA: Diagnosis not present

## 2019-06-18 DIAGNOSIS — Z791 Long term (current) use of non-steroidal anti-inflammatories (NSAID): Secondary | ICD-10-CM | POA: Insufficient documentation

## 2019-06-18 DIAGNOSIS — Z7901 Long term (current) use of anticoagulants: Secondary | ICD-10-CM | POA: Diagnosis not present

## 2019-06-18 DIAGNOSIS — Z7982 Long term (current) use of aspirin: Secondary | ICD-10-CM | POA: Insufficient documentation

## 2019-06-18 DIAGNOSIS — Z951 Presence of aortocoronary bypass graft: Secondary | ICD-10-CM | POA: Insufficient documentation

## 2019-06-18 DIAGNOSIS — N201 Calculus of ureter: Secondary | ICD-10-CM | POA: Insufficient documentation

## 2019-06-18 DIAGNOSIS — I10 Essential (primary) hypertension: Secondary | ICD-10-CM | POA: Insufficient documentation

## 2019-06-18 DIAGNOSIS — N200 Calculus of kidney: Secondary | ICD-10-CM | POA: Diagnosis not present

## 2019-06-18 DIAGNOSIS — Z01818 Encounter for other preprocedural examination: Secondary | ICD-10-CM | POA: Diagnosis not present

## 2019-06-18 HISTORY — PX: EXTRACORPOREAL SHOCK WAVE LITHOTRIPSY: SHX1557

## 2019-06-18 SURGERY — LITHOTRIPSY, ESWL
Anesthesia: LOCAL | Laterality: Right

## 2019-06-18 MED ORDER — DIAZEPAM 5 MG PO TABS
10.0000 mg | ORAL_TABLET | ORAL | Status: AC
  Administered 2019-06-18: 10 mg via ORAL
  Filled 2019-06-18: qty 2

## 2019-06-18 MED ORDER — CIPROFLOXACIN HCL 500 MG PO TABS
500.0000 mg | ORAL_TABLET | ORAL | Status: AC
  Administered 2019-06-18: 07:00:00 500 mg via ORAL
  Filled 2019-06-18: qty 1

## 2019-06-18 MED ORDER — SODIUM CHLORIDE 0.9 % IV SOLN
INTRAVENOUS | Status: DC
  Administered 2019-06-18: 07:00:00 via INTRAVENOUS

## 2019-06-18 MED ORDER — DIPHENHYDRAMINE HCL 25 MG PO CAPS
25.0000 mg | ORAL_CAPSULE | ORAL | Status: AC
  Administered 2019-06-18: 25 mg via ORAL
  Filled 2019-06-18: qty 1

## 2019-06-18 NOTE — H&P (Signed)
See scanned H&P

## 2019-06-18 NOTE — Op Note (Signed)
See [facility] OP note scanned into chart. Also because of the size, density, location and other factors that cannot be anticipated I feel this will likely be a staged procedure. This fact supersedes any indication in the scanned [facility] operative note to the contrary.  

## 2019-06-19 ENCOUNTER — Encounter (HOSPITAL_COMMUNITY): Payer: Self-pay | Admitting: Urology

## 2019-06-28 DIAGNOSIS — R0781 Pleurodynia: Secondary | ICD-10-CM | POA: Diagnosis not present

## 2019-06-28 DIAGNOSIS — I10 Essential (primary) hypertension: Secondary | ICD-10-CM | POA: Diagnosis not present

## 2019-06-28 DIAGNOSIS — I119 Hypertensive heart disease without heart failure: Secondary | ICD-10-CM | POA: Diagnosis not present

## 2019-06-28 DIAGNOSIS — E785 Hyperlipidemia, unspecified: Secondary | ICD-10-CM | POA: Diagnosis not present

## 2019-06-28 DIAGNOSIS — I2510 Atherosclerotic heart disease of native coronary artery without angina pectoris: Secondary | ICD-10-CM | POA: Diagnosis not present

## 2019-06-28 DIAGNOSIS — Z125 Encounter for screening for malignant neoplasm of prostate: Secondary | ICD-10-CM | POA: Diagnosis not present

## 2019-06-28 DIAGNOSIS — K921 Melena: Secondary | ICD-10-CM | POA: Diagnosis not present

## 2019-06-28 DIAGNOSIS — Z0000 Encounter for general adult medical examination without abnormal findings: Secondary | ICD-10-CM | POA: Diagnosis not present

## 2019-06-28 DIAGNOSIS — Z8739 Personal history of other diseases of the musculoskeletal system and connective tissue: Secondary | ICD-10-CM | POA: Diagnosis not present

## 2019-06-28 DIAGNOSIS — M722 Plantar fascial fibromatosis: Secondary | ICD-10-CM | POA: Diagnosis not present

## 2019-06-28 LAB — COMPREHENSIVE METABOLIC PANEL
Albumin: 4.4 (ref 3.5–5.0)
Calcium: 9.4 (ref 8.7–10.7)
GFR calc Af Amer: 80
GFR calc non Af Amer: 69

## 2019-06-28 LAB — LIPID PANEL
Cholesterol: 134 (ref 0–200)
HDL: 49 (ref 35–70)
LDL Cholesterol: 62
Triglycerides: 157 (ref 40–160)

## 2019-06-28 LAB — BASIC METABOLIC PANEL
BUN: 20 (ref 4–21)
CO2: 23 — AB (ref 13–22)
Chloride: 101 (ref 99–108)
Creatinine: 1.1 (ref <=1.3)
Glucose: 105
Potassium: 3.7 (ref 3.4–5.3)
Sodium: 138 (ref 137–147)

## 2019-06-28 LAB — PSA: PSA: 1.1

## 2019-06-28 LAB — HEPATIC FUNCTION PANEL
ALT: 19 (ref 10–40)
AST: 21 (ref 14–40)
Alkaline Phosphatase: 70 (ref 25–125)

## 2019-06-28 IMAGING — US US ABDOMEN LIMITED
1 series · 14 of 25 positions shown · non-contrast
Comparison: Abdomen and pelvis CT dated 04/12/2018.

CLINICAL DATA: Nausea, abdominal distention and loose stools.

EXAM:
ULTRASOUND ABDOMEN LIMITED RIGHT UPPER QUADRANT

[Series 1: us abdomen limited · 14 of 49 slices shown]
[im 1/49]
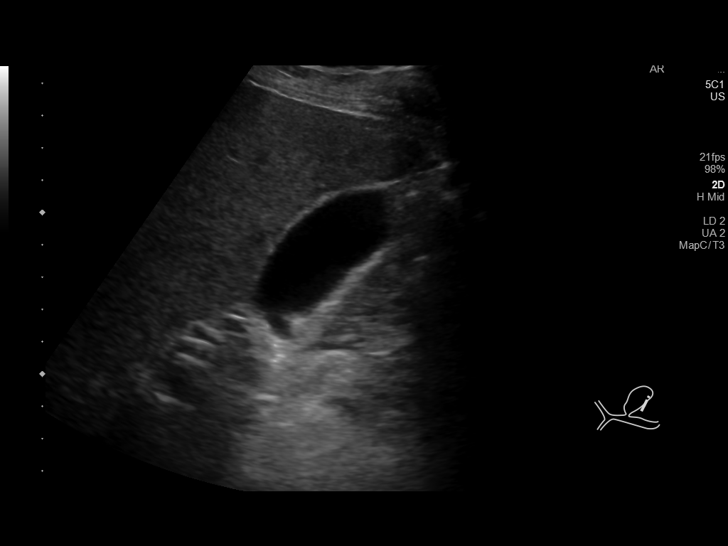
[im 5/49]
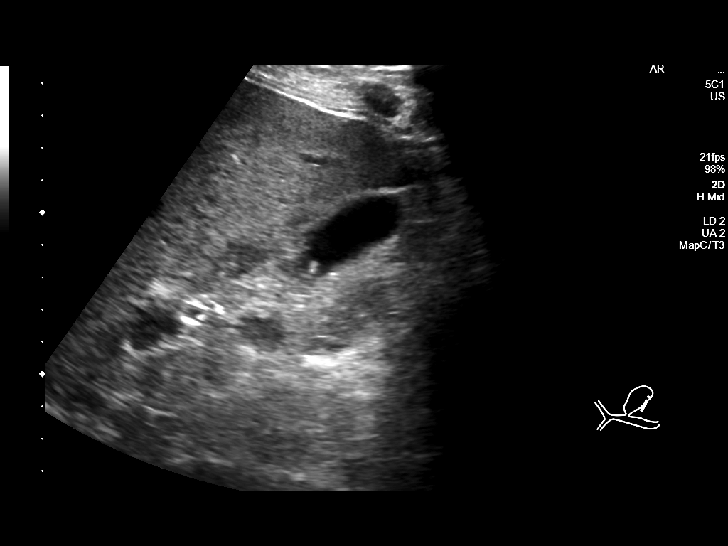
[im 9/49]
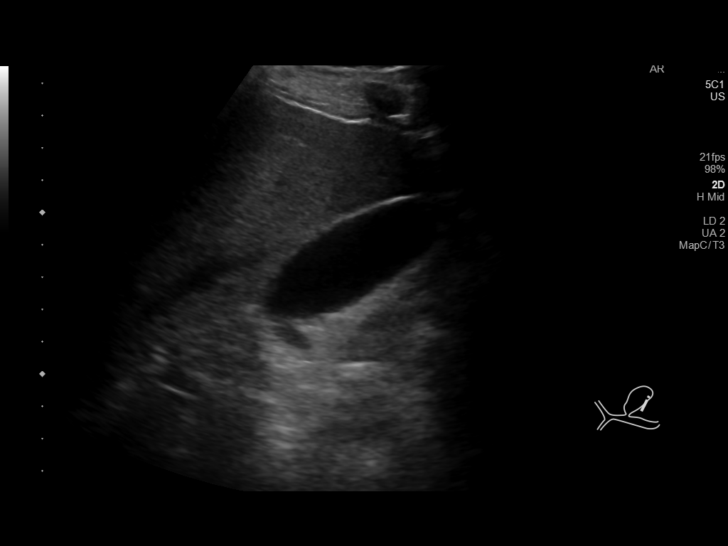
[im 13/49]
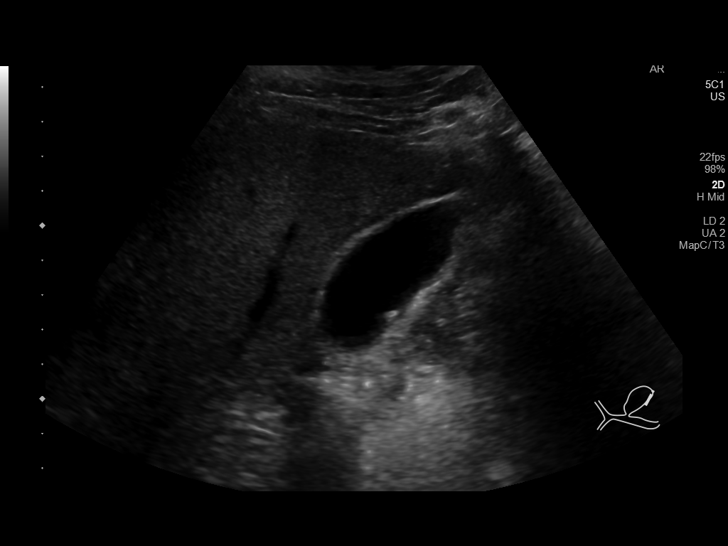
[im 17/49]
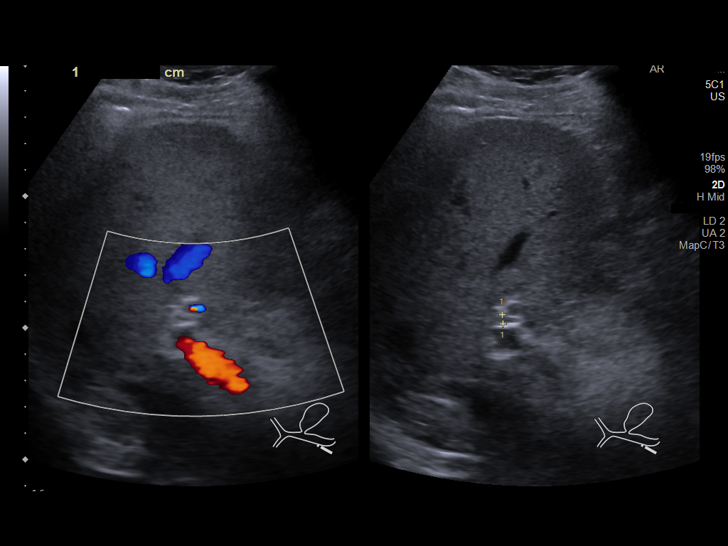
[im 19/49]
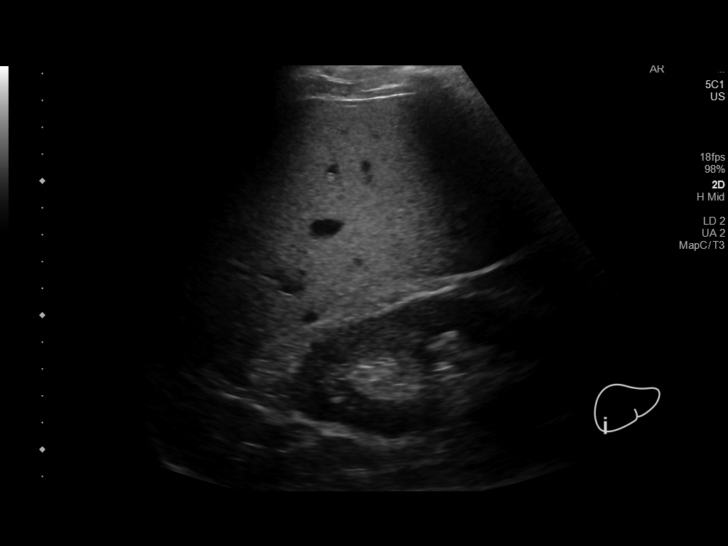
[im 23/49]
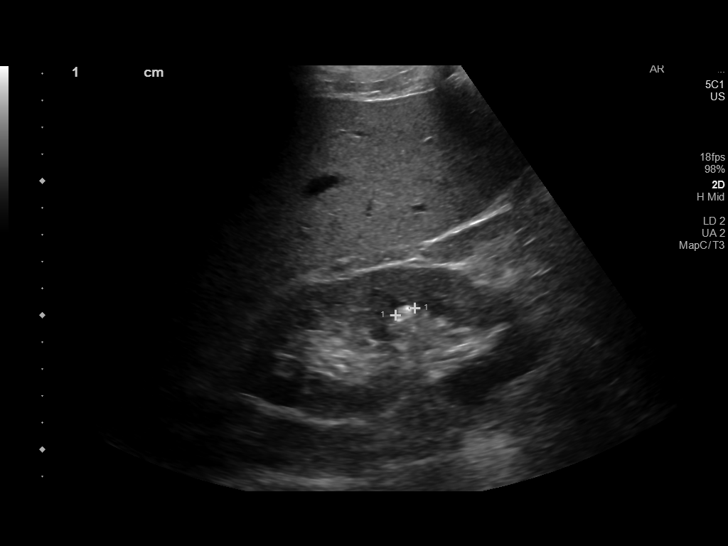
[im 27/49]
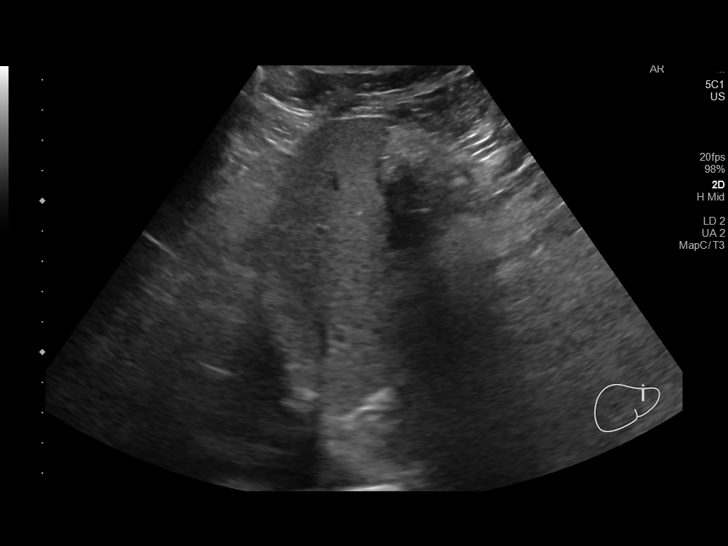
[im 31/49]
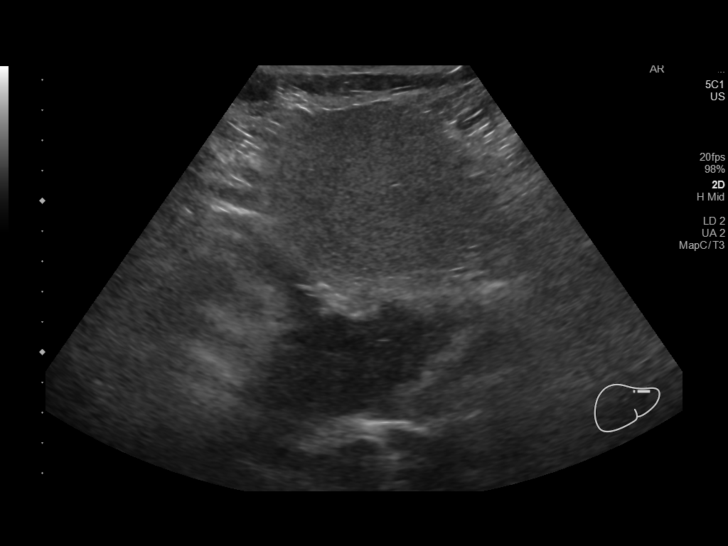
[im 33/49]
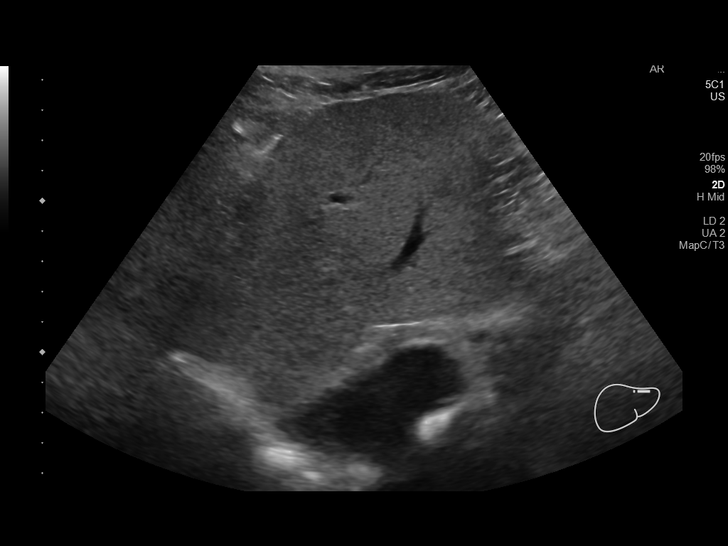
[im 37/49]
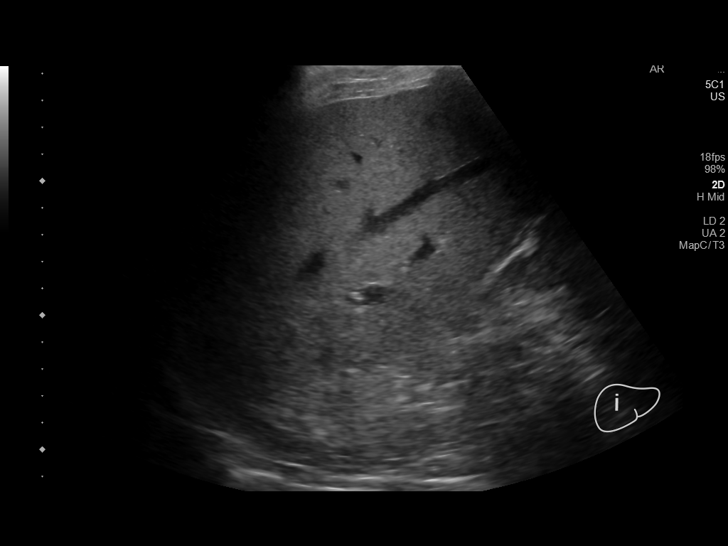
[im 41/49]
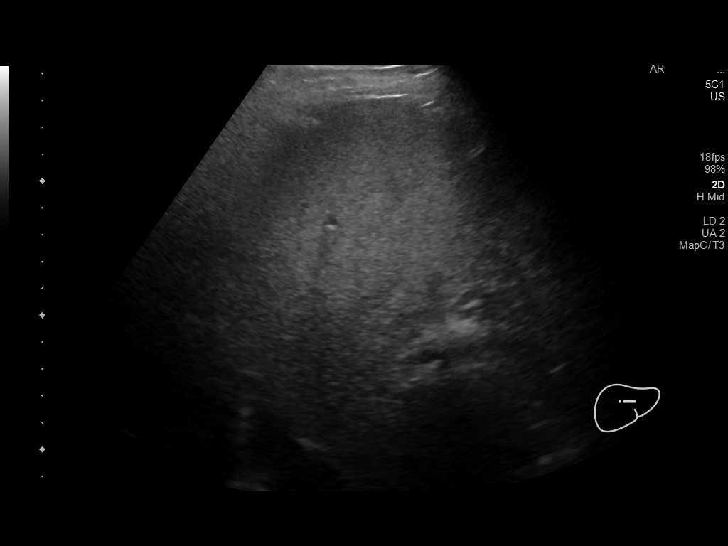
[im 45/49]
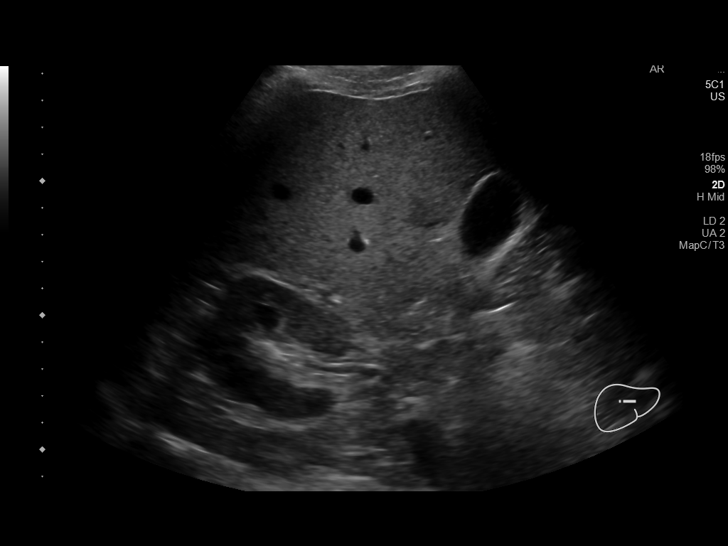
[im 49/49]
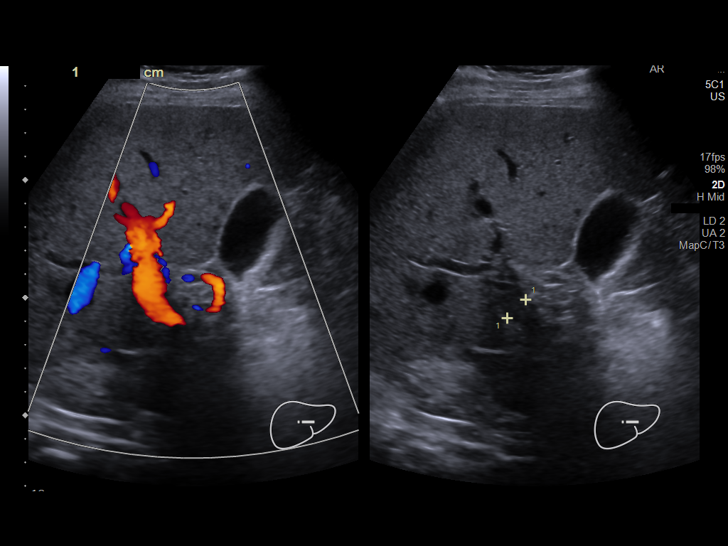

[14 of 25 positions shown; findings below may reference images not displayed]

FINDINGS: Gallbladder:

3 mm mobile calculus in the gallbladder. No gallbladder wall
thickening or pericholecystic fluid. No sonographic Murphy sign.

Common bile duct:

Diameter: 3.5 mm

Liver:

Diffusely echogenic. Corresponding low density on the previous CT.
Portal vein is patent on color Doppler imaging with normal direction
of blood flow towards the liver.

Other: 8 mm mid right renal calculus. 2.2 cm upper pole right renal
cyst.
IMPRESSION: 1. Minimal cholelithiasis without evidence of cholecystitis.
2. Diffuse hepatic steatosis.
3. 8 mm nonobstructing right renal calculus.

## 2019-07-02 DIAGNOSIS — Z0000 Encounter for general adult medical examination without abnormal findings: Secondary | ICD-10-CM | POA: Diagnosis not present

## 2019-07-02 DIAGNOSIS — N201 Calculus of ureter: Secondary | ICD-10-CM | POA: Diagnosis not present

## 2019-07-12 DIAGNOSIS — M722 Plantar fascial fibromatosis: Secondary | ICD-10-CM

## 2019-07-12 DIAGNOSIS — K921 Melena: Secondary | ICD-10-CM

## 2019-07-12 DIAGNOSIS — R0781 Pleurodynia: Secondary | ICD-10-CM

## 2019-07-12 DIAGNOSIS — Z8739 Personal history of other diseases of the musculoskeletal system and connective tissue: Secondary | ICD-10-CM

## 2019-07-12 DIAGNOSIS — I119 Hypertensive heart disease without heart failure: Secondary | ICD-10-CM

## 2019-07-12 DIAGNOSIS — I2510 Atherosclerotic heart disease of native coronary artery without angina pectoris: Secondary | ICD-10-CM

## 2019-07-12 DIAGNOSIS — E785 Hyperlipidemia, unspecified: Secondary | ICD-10-CM

## 2019-07-12 LAB — URIC ACID: Uric Acid: 4.3

## 2019-07-12 LAB — LYME IGG/IGM AB: Lyme IgG/IgM Ab: <0.9

## 2019-07-12 LAB — GALACTOSE-ALPHA-1,3-GALACTOSE IGE: Galactose-alpha-1,3-galactose IgE: <0.1

## 2019-07-25 DIAGNOSIS — M180 Bilateral primary osteoarthritis of first carpometacarpal joints: Secondary | ICD-10-CM | POA: Diagnosis not present

## 2019-07-25 DIAGNOSIS — M25521 Pain in right elbow: Secondary | ICD-10-CM | POA: Diagnosis not present

## 2019-07-25 DIAGNOSIS — M19021 Primary osteoarthritis, right elbow: Secondary | ICD-10-CM | POA: Diagnosis not present

## 2019-07-25 DIAGNOSIS — M79645 Pain in left finger(s): Secondary | ICD-10-CM | POA: Diagnosis not present

## 2019-07-25 DIAGNOSIS — M79644 Pain in right finger(s): Secondary | ICD-10-CM | POA: Diagnosis not present

## 2019-08-15 ENCOUNTER — Encounter: Payer: Self-pay | Admitting: *Deleted

## 2019-08-17 DIAGNOSIS — G7000 Myasthenia gravis without (acute) exacerbation: Secondary | ICD-10-CM

## 2019-08-17 HISTORY — DX: Myasthenia gravis without (acute) exacerbation: G70.00

## 2019-09-04 DIAGNOSIS — R7309 Other abnormal glucose: Secondary | ICD-10-CM | POA: Diagnosis not present

## 2019-09-04 DIAGNOSIS — E6609 Other obesity due to excess calories: Secondary | ICD-10-CM | POA: Diagnosis not present

## 2019-09-04 DIAGNOSIS — I10 Essential (primary) hypertension: Secondary | ICD-10-CM | POA: Diagnosis not present

## 2019-09-04 DIAGNOSIS — E7849 Other hyperlipidemia: Secondary | ICD-10-CM | POA: Diagnosis not present

## 2019-09-04 DIAGNOSIS — Z1389 Encounter for screening for other disorder: Secondary | ICD-10-CM | POA: Diagnosis not present

## 2019-09-04 DIAGNOSIS — Z6830 Body mass index (BMI) 30.0-30.9, adult: Secondary | ICD-10-CM | POA: Diagnosis not present

## 2019-09-04 DIAGNOSIS — I2510 Atherosclerotic heart disease of native coronary artery without angina pectoris: Secondary | ICD-10-CM | POA: Diagnosis not present

## 2019-09-10 DIAGNOSIS — D225 Melanocytic nevi of trunk: Secondary | ICD-10-CM | POA: Diagnosis not present

## 2019-09-10 DIAGNOSIS — L570 Actinic keratosis: Secondary | ICD-10-CM | POA: Diagnosis not present

## 2019-09-10 DIAGNOSIS — Z85828 Personal history of other malignant neoplasm of skin: Secondary | ICD-10-CM | POA: Diagnosis not present

## 2019-09-10 DIAGNOSIS — L578 Other skin changes due to chronic exposure to nonionizing radiation: Secondary | ICD-10-CM | POA: Diagnosis not present

## 2019-09-10 DIAGNOSIS — L814 Other melanin hyperpigmentation: Secondary | ICD-10-CM | POA: Diagnosis not present

## 2019-09-10 DIAGNOSIS — L821 Other seborrheic keratosis: Secondary | ICD-10-CM | POA: Diagnosis not present

## 2019-09-10 DIAGNOSIS — Z23 Encounter for immunization: Secondary | ICD-10-CM | POA: Diagnosis not present

## 2019-09-10 DIAGNOSIS — Z86018 Personal history of other benign neoplasm: Secondary | ICD-10-CM | POA: Diagnosis not present

## 2019-09-17 ENCOUNTER — Ambulatory Visit: Payer: Medicare Other | Admitting: Family Medicine

## 2019-09-17 ENCOUNTER — Encounter (INDEPENDENT_AMBULATORY_CARE_PROVIDER_SITE_OTHER): Payer: Self-pay | Admitting: Internal Medicine

## 2019-09-17 ENCOUNTER — Ambulatory Visit (INDEPENDENT_AMBULATORY_CARE_PROVIDER_SITE_OTHER): Payer: Medicare Other | Admitting: Internal Medicine

## 2019-09-17 ENCOUNTER — Other Ambulatory Visit: Payer: Self-pay

## 2019-09-17 DIAGNOSIS — R197 Diarrhea, unspecified: Secondary | ICD-10-CM | POA: Diagnosis not present

## 2019-09-17 DIAGNOSIS — R195 Other fecal abnormalities: Secondary | ICD-10-CM

## 2019-09-17 DIAGNOSIS — G7000 Myasthenia gravis without (acute) exacerbation: Secondary | ICD-10-CM | POA: Diagnosis not present

## 2019-09-17 DIAGNOSIS — H02834 Dermatochalasis of left upper eyelid: Secondary | ICD-10-CM | POA: Diagnosis not present

## 2019-09-17 DIAGNOSIS — M1990 Unspecified osteoarthritis, unspecified site: Secondary | ICD-10-CM | POA: Insufficient documentation

## 2019-09-17 DIAGNOSIS — M1811 Unilateral primary osteoarthritis of first carpometacarpal joint, right hand: Secondary | ICD-10-CM

## 2019-09-17 DIAGNOSIS — H02831 Dermatochalasis of right upper eyelid: Secondary | ICD-10-CM | POA: Diagnosis not present

## 2019-09-17 LAB — CBC WITH DIFFERENTIAL/PLATELET
Absolute Monocytes: 708 cells/uL (ref 200–950)
Basophils Absolute: 49 cells/uL (ref 0–200)
Basophils Relative: 0.8 %
Eosinophils Absolute: 281 cells/uL (ref 15–500)
Eosinophils Relative: 4.6 %
HCT: 46.1 % (ref 38.5–50.0)
Hemoglobin: 15.6 g/dL (ref 13.2–17.1)
Lymphs Abs: 1897 cells/uL (ref 850–3900)
MCH: 30.5 pg (ref 27.0–33.0)
MCHC: 33.8 g/dL (ref 32.0–36.0)
MCV: 90.2 fL (ref 80.0–100.0)
MPV: 10.1 fL (ref 7.5–12.5)
Monocytes Relative: 11.6 %
Neutro Abs: 3166 cells/uL (ref 1500–7800)
Neutrophils Relative %: 51.9 %
Platelets: 210 10*3/uL (ref 140–400)
RBC: 5.11 10*6/uL (ref 4.20–5.80)
RDW: 13 % (ref 11.0–15.0)
Total Lymphocyte: 31.1 %
WBC: 6.1 10*3/uL (ref 3.8–10.8)

## 2019-09-17 MED ORDER — METRONIDAZOLE 250 MG PO TABS: 250.0000 mg | ORAL_TABLET | Freq: Three times a day (TID) | ORAL | 0 refills | Status: DC

## 2019-09-17 MED ORDER — CIPROFLOXACIN HCL 500 MG PO TABS: 500.0000 mg | ORAL_TABLET | Freq: Two times a day (BID) | ORAL | 0 refills | Status: DC

## 2019-09-17 MED ORDER — HYDROCODONE-ACETAMINOPHEN 5-325 MG PO TABS: 1.0000 | ORAL_TABLET | Freq: Three times a day (TID) | ORAL | 0 refills | Status: DC | PRN

## 2019-09-17 NOTE — Patient Instructions (Signed)
Can take Phazyme OTC 180 mg up to 3 times a day as needed for flatulence. Begin antibiotic as soon as you have provided stool specimen to the lab. Patient will call with results of blood test and stool study when completed.

## 2019-09-17 NOTE — Progress Notes (Addendum)
Presenting complaint;  Bloating, excessive flatulence and diarrhea.  Database and subjective:  Patient is 74 year old Caucasian male who has history of chronic GERD as well as history of colonic adenomas who is due for surveillance colonoscopy in April of this year who was last seen on 10/03/2018 few days after laparoscopic cholecystectomy for symptomatic cholelithiasis and was doing well. Today he presents with 1 week history of excessive flatulence and bloating which he feels across upper abdomen. He says he belches all the time and he is also passing flatus which at times is is uncontrolled. He states when he lies flat he feels pressure and his difficulty breathing but he is fine when he sitting up. He also complains of diarrhea for the same duration. He is having 4-5 stools per day. He has had 2 accident last week. He denies rectal bleeding. He did notice single episode of black stool last week. He denies abdominal pain cramping nausea vomiting fever or chills. He states he has good appetite but is afraid to eat because it makes his bloating worse. No history of recent antibiotic use. He drinks bottled and well water. He remains on esomeprazole. He states heartburn is well controlled with therapy. He denies dysphagia. He is not sure if he has lost any weight. He had lithotripsy for urolithiasis in November last year but he says he has not gotten rid of the stones. He also complains of pain in both first metacarpophalangeal joints. It is worse on the right than on the left side. He is taking meloxicam for this pain. Patient does not smoke cigarettes. He quit 1985 after having smoked for 25 years about 2 packs/day. He drinks alcohol occasionally.  Last EGD was in December 2019 for epigastric pain and nausea. He had grade B reflux esophagitis small sliding hiatal hernia and erosive gastritis. Biopsy was negative for H. Pylori.  Last colonoscopy was in May 2016 with removal of 2 polyps and these were  tubular adenomas.  Current Medications: Outpatient Encounter Medications as of 09/17/2019  Medication Sig  . acetaminophen (TYLENOL) 500 MG tablet Take 1,000 mg by mouth daily. Take 2 tablets (1000 mg) by mouth in the morning  . allopurinol (ZYLOPRIM) 300 MG tablet Take 300 mg by mouth daily.  . Ascorbic Acid (VITAMIN C) 1000 MG tablet Take 1,000 mg by mouth daily.  Marland Kitchen aspirin EC 81 MG tablet Take 1 tablet (81 mg total) by mouth daily.  Marland Kitchen esomeprazole (NEXIUM) 40 MG capsule Take 1 capsule (40 mg total) by mouth daily before breakfast.  . famotidine (PEPCID) 20 MG tablet Take 1 tablet (20 mg total) by mouth at bedtime.  . Glucosamine-Chondroitin (OSTEO BI-FLEX REGULAR STRENGTH PO) Take 1 tablet by mouth daily.  . meloxicam (MOBIC) 7.5 MG tablet Take 7.5 mg by mouth at bedtime.   . metoprolol tartrate (LOPRESSOR) 25 MG tablet Take 25 mg by mouth 2 (two) times daily.   . Multiple Vitamin (MULITIVITAMIN WITH MINERALS) TABS Take 1 tablet by mouth daily.  . nitroGLYCERIN (NITROSTAT) 0.4 MG SL tablet Place 1 tablet (0.4 mg total) under the tongue every 5 (five) minutes as needed.  . Omega-3 Fatty Acids (FISH OIL) 1200 MG CAPS Take 500 mg by mouth daily.   . potassium chloride (K-DUR,KLOR-CON) 10 MEQ tablet Take 10 mEq by mouth daily.  . pravastatin (PRAVACHOL) 40 MG tablet Take 40 mg by mouth every evening.   . tamsulosin (FLOMAX) 0.4 MG CAPS capsule Take 0.4 mg by mouth 2 (two) times daily.   Marland Kitchen  hydrochlorothiazide (HYDRODIURIL) 50 MG tablet Take 50 mg by mouth daily.  Marland Kitchen HYDROcodone-acetaminophen (NORCO/VICODIN) 5-325 MG tablet Take 1-2 tablets by mouth every 6 (six) hours as needed for moderate pain.   No facility-administered encounter medications on file as of 09/17/2019.   Past Medical History:  Diagnosis Date  . Atherosclerotic heart disease of native coronary artery without angina pectoris   .    Marland Kitchen Coronary atherosclerosis of native coronary artery    Multivessel status post CABG March 2015   . DDD (degenerative disc disease), lumbar    Dr. Carloyn Manner  . Essential hypertension, benign   . GERD (gastroesophageal reflux disease)   .  History of arthritis involving first metacarpal of both hands.   .    . History of gout   . History of kidney stones   . History of [facility] spotted fever   . Hyperlipidemia, unspecified   .     Past Surgical History:  Procedure Laterality Date  . AMPUTATION Right 03/27/2014   Procedure: DEBRIDEMENT AND CLOSURE RIGHT INDEX FINGER;  Surgeon: [name], MD;  Location: [place];  Service: Orthopedics;  Laterality: Right;  . BACK SURGERY    . BICEPT TENODESIS  11/26/2011   Procedure: BICEPT TENODESIS;  Surgeon: Augustin Schooling, MD;  Location: [place];  Service: Orthopedics;  Laterality: Left;  . BIOPSY  08/03/2018   Procedure: BIOPSY;  Surgeon: Rogene Houston, MD;  [facility];  Service: Endoscopy;;  antral  . Cataract surgery Left   . CHOLECYSTECTOMY N/A 09/22/2018   Procedure: LAPAROSCOPIC CHOLECYSTECTOMY;  Surgeon: Virl Cagey, MD;  [facility];  Service: General;  Laterality: N/A;  . COLONOSCOPY    . COLONOSCOPY N/A 12/26/2014   Procedure: COLONOSCOPY;  Surgeon: Rogene Houston, MD;  [facility];  Service: Endoscopy;  Laterality: N/A;  730  . CORONARY ARTERY BYPASS GRAFT N/A 10/19/2013   Procedure: CORONARY ARTERY BYPASS GRAFTING (CABG);  Surgeon: Rexene Alberts, MD;  Location: [place];  Service: Open Heart Surgery;  Laterality: N/A;  CABG times four using left internal mammary artery and left leg saphenous vein, incision made on right leg but no vein removed  . CYSTOSCOPY WITH RETROGRADE PYELOGRAM, URETEROSCOPY AND STENT PLACEMENT Left 08/24/2016   Procedure: CYSTOSCOPY WITH LEFT RETROGRADE PYELOGRAM, LEFT URETEROSCOPY, BASKET EXTRACTION LEFT URETERAL STONE;  Surgeon: Irine Seal, MD;  [facility];  Service: Urology;  Laterality: Left;  . ESOPHAGOGASTRODUODENOSCOPY N/A 08/03/2018   Procedure:  ESOPHAGOGASTRODUODENOSCOPY (EGD);  Surgeon: Rogene Houston, MD;  [facility];  Service: Endoscopy;  Laterality: N/A;  2:55  . EXTRACORPOREAL SHOCK WAVE LITHOTRIPSY Right 06/18/2019   Procedure: EXTRACORPOREAL SHOCK WAVE LITHOTRIPSY (ESWL);  Surgeon: Lucas Mallow, MD;  [facility];  Service: Urology;  Laterality: Right;  . INTRAOPERATIVE TRANSESOPHAGEAL ECHOCARDIOGRAM N/A 10/19/2013   Procedure: INTRAOPERATIVE TRANSESOPHAGEAL ECHOCARDIOGRAM;  Surgeon: Rexene Alberts, MD;  Location: [place];  Service: Open Heart Surgery;  Laterality: N/A;  . JOINT REPLACEMENT    . LEFT HEART CATHETERIZATION WITH CORONARY ANGIOGRAM N/A 10/09/2013   Procedure: LEFT HEART CATHETERIZATION WITH CORONARY ANGIOGRAM;  Surgeon: Burnell Blanks, MD;  Location: [facility];  Service: Cardiovascular;  Laterality: N/A;  . Left shoulder rotator cuff repair    . LITHOTRIPSY    . TOTAL KNEE ARTHROPLASTY Left   . traumaticvamputation of finger  2015  . VASECTOMY       Objective: Blood pressure (!) 154/87, pulse Marland Kitchen)  56, temperature (!) 97.3 F (36.3 C), temperature source Temporal, height '6\' 1"'  (1.854 m), weight 210 lb 12.8 oz (95.6 kg). Patient is alert and in no acute distress. He is wearing a facial mask. Conjunctiva is pink. Sclera is nonicteric Oropharyngeal mucosa is normal. No neck masses or thyromegaly noted. Cardiac exam with regular rhythm normal S1 and S2. No murmur or gallop noted. Lungs are clear to auscultation. Abdomen is full. Bowel sounds are normal. On palpation abdomen is soft. He has mild midepigastric tenderness. Percussion note is markedly tympanic across upper abdomen. No organomegaly or masses. Rectal examination reveals normal sphincter tone. Brown stool in the vault and it is guaiac positive. No LE edema or clubbing noted.  Labs/studies Results:  CBC Latest Ref Rng & Units 09/21/2018 08/20/2016 03/27/2014  WBC 4.0 - 10.5 K/uL 8.5 6.8 11.9(H)  Hemoglobin 13.0 - 17.0 g/dL  14.6 15.2 14.2  Hematocrit 39.0 - 52.0 % 45.8 44.7 43.0  Platelets 150 - 400 K/uL 236 241 239    CMP Latest Ref Rng & Units 06/28/2019 09/21/2018 04/12/2018  Glucose 70 - 99 mg/dL - 128(H) -  BUN 4 - '21 20 19 ' -  Creatinine 0.6 - 1.3 1.1 1.05 1.00  Sodium 137 - 147 138 137 -  Potassium 3.4 - 5.3 3.7 3.2(L) -  Chloride 99 - 108 101 106 -  CO2 13 - 22 23(A) 23 -  Calcium 8.7 - 10.7 9.4 9.1 -  Total Protein 6.0 - 8.3 g/dL - - -  Total Bilirubin 0.2 - 1.2 mg/dL - - -  Alkaline Phos 25 - 125 70 - -  AST 14 - 40 21 - -  ALT 10 - 40 19 - -    Hepatic Function Latest Ref Rng & Units 06/28/2019 12/06/2013 10/18/2013  Total Protein 6.0 - 8.3 g/dL - 7.0 7.1  Albumin 3.5 - 5.0 4.4 4.4 4.0  AST 14 - 40 '21 23 29  ' ALT 10 - 40 '19 22 24  ' Alk Phosphatase 25 - 125 70 74 68  Total Bilirubin 0.2 - 1.2 mg/dL - 0.7 0.5  Bilirubin, Direct 0.0 - 0.3 mg/dL - 0.2 -     Assessment:  #1. Patient presents with intractable flatulence and diarrhea of 1 week duration. No history of recent antibiotic use. He does not appear to be acutely ill. His symptoms are most likely due to enteric infection or enterocolonic infection. His stool is heme positive which may or may not be part and parcel of his acute illness. He is on meloxicam and therefore at risk for peptic ulcer disease.  #2. Heme positive stool. Patient reports single episode of melena last week. Patient is on meloxicam and therefore could have NSAID induced injury or it is also possible that heme positive stool is due to diarrhea. Will need to hold NSAIDs while work-up underway.  #3. History of colonic adenomas. He is due for surveillance colonoscopy later this year.   Plan:  Discontinue meloxicam for now. Can take Tylenol up to 2 g/day on as-needed basis and if pain is not controlled with Tylenol he can take hydrocodone/acetaminophen 5/325 1 tablet 3 times daily as needed. Prescription given for 21 doses without refill. (Overdose risk is 300) Patient go to  the lab for CBC. Stool test for C. difficile and GI pathogen panel. Patient will start Cipro 500 mg by mouth twice daily along with metronidazole to 50 mg by mouth 3 times a day as soon as he has submitted stool specimen  to the lab. Further recommendations to follow.

## 2019-09-18 DIAGNOSIS — R195 Other fecal abnormalities: Secondary | ICD-10-CM | POA: Diagnosis not present

## 2019-09-18 DIAGNOSIS — R197 Diarrhea, unspecified: Secondary | ICD-10-CM | POA: Diagnosis not present

## 2019-09-19 ENCOUNTER — Telehealth (INDEPENDENT_AMBULATORY_CARE_PROVIDER_SITE_OTHER): Payer: Self-pay | Admitting: Internal Medicine

## 2019-09-19 NOTE — Telephone Encounter (Signed)
Patients wife called regarding test results - ph# [phone]

## 2019-09-19 NOTE — Telephone Encounter (Signed)
Please let the patient's wife know that Dr.Rehman will call the patient after he reviews that patient's results.  Thank You.

## 2019-09-24 ENCOUNTER — Other Ambulatory Visit: Payer: Self-pay

## 2019-09-24 ENCOUNTER — Encounter: Payer: Self-pay | Admitting: Diagnostic Neuroimaging

## 2019-09-24 ENCOUNTER — Ambulatory Visit (INDEPENDENT_AMBULATORY_CARE_PROVIDER_SITE_OTHER): Payer: Medicare Other | Admitting: Diagnostic Neuroimaging

## 2019-09-24 VITALS — BP 163/86 | HR 51 | Temp 97.7°F | Ht 73.0 in | Wt 215.0 lb

## 2019-09-24 DIAGNOSIS — G7000 Myasthenia gravis without (acute) exacerbation: Secondary | ICD-10-CM

## 2019-09-24 MED ORDER — PYRIDOSTIGMINE BROMIDE 60 MG PO TABS: 30.0000 mg | ORAL_TABLET | Freq: Three times a day (TID) | ORAL | 6 refills | Status: DC

## 2019-09-24 NOTE — Patient Instructions (Addendum)
OCULAR MYASTHENIA GRAVIS (active)  - pyridostigmine 30mg  twice a day; may increase up to 60mg  three times a day  - check CT chest

## 2019-09-24 NOTE — Progress Notes (Signed)
[facility]  [name] DOB: 10/01/1945  REFERRING CLINICIAN: Sinda Du, MD HISTORY FROM: patient and wife  REASON FOR VISIT: new consult    HISTORICAL  CHIEF COMPLAINT:  Chief Complaint  Patient presents with  . Myasthenia Gravis    rm 7 New Pt, wife- Brandon [name]    HISTORY OF PRESENT ILLNESS:   74 year old male here for evaluation of myasthenia gravis.  September 10, 2019 patient noticed right eye ptosis, progressing to left eye ptosis.  He was having some stomach pain, diarrhea, gas, and breathing issues around the same time.  [name], had [place] R antibody testing which was positive and diagnosed with myasthenia gravis.  Around the same time patient was with a GI doctor, diagnosed with possible bacterial infection and treated with antibiotics.  He was also noted to be heme positive stool.  Since that time I symptoms are stable.  Symptoms are worse in the evening compared the morning.  [name].  Denies any shortness of breath.  Denies any speech or swallowing problems.  Eye drooping is severe enough that sometimes it obstructs his vision and he is not able to drive currently.   REVIEW OF SYSTEMS: Full 14 system review of systems performed and negative with exception of: As per HPI.   ALLERGIES: No Known Allergies  HOME MEDICATIONS: Outpatient Medications Prior to Visit  Medication Sig Dispense Refill  . acetaminophen (TYLENOL) 500 MG tablet Take 1,000 mg by mouth daily. Take 2 tablets (1000 mg) by mouth in the morning    . allopurinol (ZYLOPRIM) 300 MG tablet Take 300 mg by mouth daily.    . Ascorbic Acid (VITAMIN C) 1000 MG tablet Take 1,000 mg by mouth daily.    Marland Kitchen aspirin EC 81 MG tablet Take 1 tablet (81 mg total) by mouth daily. 90 tablet 3  . ciprofloxacin (CIPRO) 500 MG tablet Take 1 tablet (500 mg total) by mouth 2 (two) times daily. 14 tablet 0  . famotidine (PEPCID) 20 MG  tablet Take 1 tablet (20 mg total) by mouth at bedtime.    . Glucosamine-Chondroitin (OSTEO BI-FLEX REGULAR STRENGTH PO) Take 1 tablet by mouth daily.    Marland Kitchen HYDROcodone-acetaminophen (NORCO/VICODIN) 5-325 MG tablet Take 1 tablet by mouth 3 (three) times daily as needed for moderate pain. 21 tablet 0  . metoprolol tartrate (LOPRESSOR) 25 MG tablet Take 25 mg by mouth 2 (two) times daily.     . metroNIDAZOLE (FLAGYL) 250 MG tablet Take 1 tablet (250 mg total) by mouth 3 (three) times daily. 30 tablet 0  . Multiple Vitamin (MULITIVITAMIN WITH MINERALS) TABS Take 1 tablet by mouth daily.    . nitroGLYCERIN (NITROSTAT) 0.4 MG SL tablet Place 1 tablet (0.4 mg total) under the tongue every 5 (five) minutes as needed. 25 tablet 3  . Omega-3 Fatty Acids (FISH OIL) 1200 MG CAPS Take 500 mg by mouth daily.     . potassium chloride (K-DUR,KLOR-CON) 10 MEQ tablet Take 10 mEq by mouth daily.    . pravastatin (PRAVACHOL) 40 MG tablet Take 40 mg by mouth every evening.     . tamsulosin (FLOMAX) 0.4 MG CAPS capsule Take 0.4 mg by mouth 2 (two) times daily.     Marland Kitchen esomeprazole (NEXIUM) 40 MG capsule Take 1 capsule (40 mg total) by mouth daily before breakfast. 30 capsule 5  . hydrochlorothiazide (HYDRODIURIL) 50 MG tablet Take 50 mg by mouth daily.  No facility-administered medications prior to visit.    PAST MEDICAL HISTORY: Past Medical History:  Diagnosis Date  . Atherosclerotic heart disease of native coronary artery without angina pectoris   . Cataract   . Coronary atherosclerosis of native coronary artery    Multivessel status post CABG March 2015  . DDD (degenerative disc disease), lumbar    Dr. Carloyn Manner  . Essential hypertension, benign   . GERD (gastroesophageal reflux disease)   . Hard of hearing   . Hemorrhoids   . History of gout   . History of kidney stones   . History of [facility] spotted fever   . Hyperlipidemia, unspecified   . Hypertensive heart disease without heart failure   .  Kidney stones   . Lumbago   . Lumbago   . Melena   . Mixed hyperlipidemia   . Personal history of other diseases of the musculoskeletal system and connective tissue   . Plantar fascial fibromatosis   . Pleurodynia   . Renal calculus or stone 05/22/14  . Unspecified abdominal pain   . Unspecified osteoarthritis, unspecified site     PAST SURGICAL HISTORY: Past Surgical History:  Procedure Laterality Date  . AMPUTATION Right 03/27/2014   Procedure: DEBRIDEMENT AND CLOSURE RIGHT INDEX FINGER;  Surgeon: [name], MD;  Location: [place];  Service: Orthopedics;  Laterality: Right;  . BACK SURGERY    . BICEPT TENODESIS  11/26/2011   Procedure: BICEPT TENODESIS;  Surgeon: Augustin Schooling, MD;  Location: [place];  Service: Orthopedics;  Laterality: Left;  . BIOPSY  08/03/2018   Procedure: BIOPSY;  Surgeon: Rogene Houston, MD;  [facility];  Service: Endoscopy;;  antral  . Cataract surgery Left   . CHOLECYSTECTOMY N/A 09/22/2018   Procedure: LAPAROSCOPIC CHOLECYSTECTOMY;  Surgeon: Virl Cagey, MD;  Location: [facility];  Service: General;  Laterality: N/A;  . COLONOSCOPY    . COLONOSCOPY N/A 12/26/2014   Procedure: COLONOSCOPY;  Surgeon: Rogene Houston, MD;  [facility];  Service: Endoscopy;  Laterality: N/A;  730  . CORONARY ARTERY BYPASS GRAFT N/A 10/19/2013   Procedure: CORONARY ARTERY BYPASS GRAFTING (CABG);  Surgeon: Rexene Alberts, MD;  Location: [place];  Service: Open Heart Surgery;  Laterality: N/A;  CABG times four using left internal mammary artery and left leg saphenous vein, incision made on right leg but no vein removed  . CYSTOSCOPY WITH RETROGRADE PYELOGRAM, URETEROSCOPY AND STENT PLACEMENT Left 08/24/2016   Procedure: CYSTOSCOPY WITH LEFT RETROGRADE PYELOGRAM, LEFT URETEROSCOPY, BASKET EXTRACTION LEFT URETERAL STONE;  Surgeon: Irine Seal, MD;  Location: [facility];  Service: Urology;  Laterality: Left;  . ESOPHAGOGASTRODUODENOSCOPY N/A 08/03/2018   Procedure:  ESOPHAGOGASTRODUODENOSCOPY (EGD);  Surgeon: Rogene Houston, MD;  [facility];  Service: Endoscopy;  Laterality: N/A;  2:55  . EXTRACORPOREAL SHOCK WAVE LITHOTRIPSY Right 06/18/2019   Procedure: EXTRACORPOREAL SHOCK WAVE LITHOTRIPSY (ESWL);  Surgeon: Lucas Mallow, MD;  Location: [facility];  Service: Urology;  Laterality: Right;  . INTRAOPERATIVE TRANSESOPHAGEAL ECHOCARDIOGRAM N/A 10/19/2013   Procedure: INTRAOPERATIVE TRANSESOPHAGEAL ECHOCARDIOGRAM;  Surgeon: Rexene Alberts, MD;  Location: [place];  Service: Open Heart Surgery;  Laterality: N/A;  . JOINT REPLACEMENT    . LEFT HEART CATHETERIZATION WITH CORONARY ANGIOGRAM N/A 10/09/2013   Procedure: LEFT HEART CATHETERIZATION WITH CORONARY ANGIOGRAM;  Surgeon: Burnell Blanks, MD;  Location: [facility];  Service: Cardiovascular;  Laterality: N/A;  . Left shoulder rotator cuff repair    .  LITHOTRIPSY    . TOTAL KNEE ARTHROPLASTY Left   . traumaticvamputation of finger  2015  . VASECTOMY      FAMILY HISTORY: Family History  Problem Relation Age of Onset  . Hypertension Sister   . Lung cancer Father   . Heart failure Mother     SOCIAL HISTORY: Social History   Socioeconomic History  . Marital status: Married    Spouse name: Brandon [name]  . Number of children: 2  . Years of education: Not on file  . Highest education level: 10th grade  Occupational History  . Occupation: lumber mills  Tobacco Use  . Smoking status: Former Smoker    Packs/day: 2.00    Years: 30.00    Pack years: 60.00    Types: Cigarettes    Start date: 08/16/1958    Quit date: 08/17/1983    Years since quitting: 36.1  . Smokeless tobacco: Former Systems developer    Types: Chew  . Tobacco comment: quit smoking 98yrs ago  Substance and Sexual Activity  . Alcohol use: Yes    Alcohol/week: 0.0 standard drinks    Comment: Occassional  . Drug use: No  . Sexual activity: Yes  Other Topics Concern  . Not on file  Social History Narrative   Lives with wife    Caffeine- coffee 3- 4 c daily   Social Determinants of Health   Financial Resource Strain:   . Difficulty of Paying Living Expenses: Not on file  Food Insecurity:   . Worried About Charity fundraiser in the Last Year: Not on file  . Ran Out of Food in the Last Year: Not on file  Transportation Needs:   . Lack of Transportation (Medical): Not on file  . Lack of Transportation (Non-Medical): Not on file  Physical Activity:   . Days of Exercise per Week: Not on file  . Minutes of Exercise per Session: Not on file  Stress:   . Feeling of Stress : Not on file  Social Connections:   . Frequency of Communication with Friends and Family: Not on file  . Frequency of Social Gatherings with Friends and Family: Not on file  . Attends Religious Services: Not on file  . Active Member of Clubs or Organizations: Not on file  . Attends Archivist Meetings: Not on file  . Marital Status: Not on file  Intimate Partner Violence:   . Fear of Current or Ex-Partner: Not on file  . Emotionally Abused: Not on file  . Physically Abused: Not on file  . Sexually Abused: Not on file     PHYSICAL EXAM  GENERAL EXAM/CONSTITUTIONAL: Vitals:  Vitals:   09/24/19 1329  BP: (!) 163/86  Pulse: (!) 51  Temp: 97.7 F (36.5 C)  Weight: 215 lb (97.5 kg)  Height: 6\' 1"  (1.854 m)     Body mass index is 28.37 kg/m. Wt Readings from Last 3 Encounters:  09/24/19 215 lb (97.5 kg)  09/17/19 210 lb 12.8 oz (95.6 kg)  06/18/19 210 lb (95.3 kg)     Patient is in no distress; well developed, nourished and groomed; neck is supple  CARDIOVASCULAR:  Examination of carotid arteries is normal; no carotid bruits  Regular rate and rhythm, no murmurs  Examination of peripheral vascular system by observation and palpation is normal  EYES:  Ophthalmoscopic exam of optic discs and posterior segments is normal; no papilledema or hemorrhages  No exam data present  MUSCULOSKELETAL:  Gait,  strength, tone, movements noted  in Neurologic exam below  NEUROLOGIC: MENTAL STATUS:  No flowsheet data found.  awake, alert, oriented to person, place and time  recent and remote memory intact  normal attention and concentration  language fluent, comprehension intact, naming intact  fund of knowledge appropriate  CRANIAL NERVE:   2nd - no papilledema on fundoscopic exam  2nd, 3rd, 4th, 6th - pupils equal and reactive to light, visual fields full to confrontation, extraocular muscles intact, no nystagmus; BILATERAL PTOSIS (RIGHT > LEFT)  5th - facial sensation symmetric  7th - facial strength symmetric  8th - hearing --> REDUCED  9th - palate elevates symmetrically, uvula midline  11th - shoulder shrug symmetric  12th - tongue protrusion midline  MILD DYSARTHRIA  MOTOR:   normal bulk and tone, full strength in the BUE, BLE  SENSORY:   normal and symmetric to light touch, temperature, vibration  COORDINATION:   finger-nose-finger, fine finger movements normal  REFLEXES:   deep tendon reflexes present and symmetric  GAIT/STATION:   narrow based gait     DIAGNOSTIC DATA (LABS, IMAGING, TESTING) - I reviewed patient records, labs, notes, testing and imaging myself where available.  Lab Results  Component Value Date   WBC 6.1 09/17/2019   HGB 15.6 09/17/2019   HCT 46.1 09/17/2019   MCV 90.2 09/17/2019   PLT 210 09/17/2019      Component Value Date/Time   NA 138 06/28/2019 0000   K 3.7 06/28/2019 0000   CL 101 06/28/2019 0000   CO2 23 (A) 06/28/2019 0000   GLUCOSE 128 (H) 09/21/2018 1408   BUN 20 06/28/2019 0000   CREATININE 1.1 06/28/2019 0000   CREATININE 1.05 09/21/2018 1408   CALCIUM 9.4 06/28/2019 0000   PROT 7.0 12/06/2013 0757   ALBUMIN 4.4 06/28/2019 0000   AST 21 06/28/2019 0000   ALT 19 06/28/2019 0000   ALKPHOS 70 06/28/2019 0000   BILITOT 0.7 12/06/2013 0757   GFRNONAA 69 06/28/2019 0000   GFRAA 80 06/28/2019 0000   Lab  Results  Component Value Date   CHOL 134 06/28/2019   HDL 49 06/28/2019   LDLCALC 62 06/28/2019   TRIG 157 06/28/2019   CHOLHDL 2.4 12/06/2013   Lab Results  Component Value Date   HGBA1C 5.9 (H) 10/18/2013   No results found for: VITAMINB12 No results found for: TSH  09/17/19 AchR ab - 71 HIGH   ASSESSMENT AND PLAN  74 y.o. year old male here with new onset ptosis, blurred vision, with positive ACH antibody, consistent with ocular myasthenia gravis.  Will start pyridostigmine and monitor symptoms.  Cautioned on progression to generalized symptoms and educated on when to proceed to ER evaluation.  Dx:  1. Ocular myasthenia (HCC)      PLAN:  OCULAR MYASTHENIA GRAVIS (active) - pyridostigmine 30mg  twice a day; may increase up to 1 tab three times a day - check CT chest (eval for thymoma)  Meds ordered this encounter  Medications  . pyridostigmine (MESTINON) 60 MG tablet    Sig: Take 0.5-1 tablets (30-60 mg total) by mouth 3 (three) times daily.    Dispense:  90 tablet    Refill:  6   Orders Placed This Encounter  Procedures  . CT CHEST W CONTRAST   Return in about 3 months (around 12/22/2019).    Penni Bombard, MD [id number], [id number] PM Certified in Neurology, Neurophysiology and Neuroimaging  [facility] [address], [place], [place] [address] [phone]

## 2019-09-25 ENCOUNTER — Telehealth (INDEPENDENT_AMBULATORY_CARE_PROVIDER_SITE_OTHER): Payer: Self-pay | Admitting: Internal Medicine

## 2019-09-25 NOTE — Telephone Encounter (Signed)
Wife called regarding test results - please advise - [phone]

## 2019-09-26 LAB — GASTROINTESTINAL PATHOGEN PANEL PCR
C. difficile Tox A/B, PCR: NOT DETECTED
Campylobacter, PCR: NOT DETECTED
Cryptosporidium, PCR: NOT DETECTED
E coli (ETEC) LT/ST PCR: NOT DETECTED
E coli (STEC) stx1/stx2, PCR: NOT DETECTED
E coli 0157, PCR: NOT DETECTED
Giardia lamblia, PCR: NOT DETECTED
Norovirus, PCR: NOT DETECTED
Rotavirus A, PCR: NOT DETECTED
Salmonella, PCR: NOT DETECTED
Shigella, PCR: NOT DETECTED

## 2019-09-26 LAB — C. DIFFICILE GDH AND TOXIN A/B
GDH ANTIGEN: NOT DETECTED
MICRO NUMBER:: 10108128
SPECIMEN QUALITY:: ADEQUATE
TOXIN A AND B: NOT DETECTED

## 2019-09-27 NOTE — Telephone Encounter (Signed)
Please let her know that Dr.Rehman calls all the patient's with his or her test results as he gets them.

## 2019-10-03 ENCOUNTER — Telehealth: Payer: Self-pay | Admitting: Diagnostic Neuroimaging

## 2019-10-03 NOTE — Telephone Encounter (Signed)
Medicare/bcbs supp no auth. Patient is scheduled at [facility] for 10/15/19 arrival time is 4:45 pm. Liquids only 4 hours prior to exam. [name] his wife on DPR is aware of time and day. I also gave her their number of [phone] incase she needed to r/s.

## 2019-10-04 ENCOUNTER — Ambulatory Visit (INDEPENDENT_AMBULATORY_CARE_PROVIDER_SITE_OTHER): Payer: Medicare Other | Admitting: Gastroenterology

## 2019-10-04 ENCOUNTER — Ambulatory Visit (INDEPENDENT_AMBULATORY_CARE_PROVIDER_SITE_OTHER): Payer: Medicare Other | Admitting: Nurse Practitioner

## 2019-10-08 ENCOUNTER — Telehealth: Payer: Self-pay | Admitting: *Deleted

## 2019-10-08 ENCOUNTER — Telehealth: Payer: Self-pay | Admitting: Diagnostic Neuroimaging

## 2019-10-08 ENCOUNTER — Telehealth (INDEPENDENT_AMBULATORY_CARE_PROVIDER_SITE_OTHER): Payer: Self-pay | Admitting: Gastroenterology

## 2019-10-08 ENCOUNTER — Other Ambulatory Visit: Payer: Self-pay

## 2019-10-08 ENCOUNTER — Ambulatory Visit (INDEPENDENT_AMBULATORY_CARE_PROVIDER_SITE_OTHER): Payer: Medicare Other | Admitting: Gastroenterology

## 2019-10-08 ENCOUNTER — Encounter (INDEPENDENT_AMBULATORY_CARE_PROVIDER_SITE_OTHER): Payer: Self-pay | Admitting: Gastroenterology

## 2019-10-08 VITALS — BP 126/77 | HR 53 | Temp 97.5°F | Ht 73.0 in | Wt 208.1 lb

## 2019-10-08 DIAGNOSIS — Z86010 Personal history of colonic polyps: Secondary | ICD-10-CM | POA: Diagnosis not present

## 2019-10-08 DIAGNOSIS — R197 Diarrhea, unspecified: Secondary | ICD-10-CM

## 2019-10-08 NOTE — Telephone Encounter (Signed)
Reviewed. -VRP

## 2019-10-08 NOTE — Telephone Encounter (Signed)
Pt's wife [name] on [place] called stating that the pt on several occasions has had double vision and also she has noticed that since he has started the pyridostigmine (MESTINON) 60 MG tablet there has been no change. Please advise.

## 2019-10-08 NOTE — Telephone Encounter (Signed)
May try pyridostigmine 90mg  (60mg /tab x 1.5tabs) three times a day.   Penni Bombard, MD [id number], [id number] PM Certified in Neurology, Neurophysiology and Neuroimaging  [facility] [address] [place], [place] [address] [phone]

## 2019-10-08 NOTE — Progress Notes (Signed)
Patient profile: Brandon [name] is a 74 y.o. male last seen in clinic on 09/17/19.  At that visit he was seen with 1 week of bloating, diarrhea, and upper abdominal pain.  Also associated with diarrhea.  Around the same time developed symptoms of droopy eyelid and was diagnosed with myasthenia gravis.  At his last visit meloxicam was discontinued, he was started on Cipro and Flagyl empirically.  Labs were unremarkable as below.  History of Present Illness: Brandon [name] is seen today for follow-up.  He reports doing very well since his last visit.  He completed his antibiotics about a week ago.  He reports over the past week he tried the Imodium, he reports with the Imodium it actually caused constipation and he has stopped Imodium.  He is now moving his stools about once a day without the Imodium.  His gas and bloating is definitely better.  Previously he was having 3-4 loose stools with severe urgency.  He is no longer having urgency.  He is tolerating a normal diet.  He denies any nausea vomiting or GERD with the symptoms.  No rectal bleeding.  Wife agrees he is doing well. No concerns about symptoms today.   He also developed symptoms of myasthenia gravis the same time his diarrhea symptoms began.  He is started 30 mg twice daily of pyridostigmine, he has had some continued issues with blurred vision and is discussing dosage increase with neurology.   Wt Readings from Last 3 Encounters:  10/08/19 208 lb 1.6 oz (94.4 kg)  09/24/19 215 lb (97.5 kg)  09/17/19 210 lb 12.8 oz (95.6 kg)     Last Colonoscopy: 2016 Prep excellent. Two small diverticula noted at hepatic flexure. 5 mm polyp cold snared from proximal sigmoid colon. 7 mm polyp cold snared from rectosigmoid junction. Residual polyp ablated with snare tip. Both polyps were submitted together. Normal rectal mucosa. Small hemorrhoids above the dentate line. 5 year repeat recommended, path with tubular adenomas.    Last  Endoscopy 2019:  Normal mid esophagus and distal esophagus.  LA Grade B reflux esophagitis. - Z-line irregular, 39 cm from the incisors.  - 3 cm hiatal hernia.  - Erosive gastropathy. Biopsied.  - Normal duodenal bulb and second portion of the duodenum.   Past Medical History:  Past Medical History:  Diagnosis Date  . Atherosclerotic heart disease of native coronary artery without angina pectoris   . Cataract   . Coronary atherosclerosis of native coronary artery    Multivessel status post CABG March 2015  . DDD (degenerative disc disease), lumbar    Dr. Carloyn Manner  . Essential hypertension, benign   . GERD (gastroesophageal reflux disease)   . Hard of hearing   . Hemorrhoids   . History of gout   . History of kidney stones   . History of [facility] spotted fever   . Hyperlipidemia, unspecified   . Hypertensive heart disease without heart failure   . Kidney stones   . Lumbago   . Lumbago   . Melena   . Mixed hyperlipidemia   . Personal history of other diseases of the musculoskeletal system and connective tissue   . Plantar fascial fibromatosis   . Pleurodynia   . Renal calculus or stone 05/22/14  . Unspecified abdominal pain   . Unspecified osteoarthritis, unspecified site     Problem List: Patient Active Problem List   Diagnosis Date Noted  . Acute diarrhea 09/17/2019  . Heme positive stool 09/17/2019  .  Unspecified osteoarthritis, unspecified site   . Hyperlipidemia, unspecified   . Personal history of other diseases of the musculoskeletal system and connective tissue   . Atherosclerotic heart disease of native coronary artery without angina pectoris   . Hypertensive heart disease without heart failure   . Melena   . Plantar fascial fibromatosis   . Pleurodynia   . Calculus of gallbladder without cholecystitis without obstruction 09/14/2018  . Nausea without vomiting 06/05/2018  . Postoperative atrial fibrillation ([place]) 11/09/2013  . S/P CABG x 4 10/19/2013  .  Coronary atherosclerosis of native coronary artery 10/18/2013  . Essential hypertension, benign 09/18/2013  . Family history of hypertrophic cardiomyopathy 09/18/2013  . Mixed hyperlipidemia     Past Surgical History: Past Surgical History:  Procedure Laterality Date  . AMPUTATION Right 03/27/2014   Procedure: DEBRIDEMENT AND CLOSURE RIGHT INDEX FINGER;  Surgeon: [name], MD;  Location: [place];  Service: Orthopedics;  Laterality: Right;  . BACK SURGERY    . BICEPT TENODESIS  11/26/2011   Procedure: BICEPT TENODESIS;  Surgeon: Augustin Schooling, MD;  Location: [place];  Service: Orthopedics;  Laterality: Left;  . BIOPSY  08/03/2018   Procedure: BIOPSY;  Surgeon: Rogene Houston, MD;  [facility];  Service: Endoscopy;;  antral  . Cataract surgery Left   . CHOLECYSTECTOMY N/A 09/22/2018   Procedure: LAPAROSCOPIC CHOLECYSTECTOMY;  Surgeon: Virl Cagey, MD;  Location: [facility];  Service: General;  Laterality: N/A;  . COLONOSCOPY    . COLONOSCOPY N/A 12/26/2014   Procedure: COLONOSCOPY;  Surgeon: Rogene Houston, MD;  [facility];  Service: Endoscopy;  Laterality: N/A;  730  . CORONARY ARTERY BYPASS GRAFT N/A 10/19/2013   Procedure: CORONARY ARTERY BYPASS GRAFTING (CABG);  Surgeon: Rexene Alberts, MD;  Location: [place];  Service: Open Heart Surgery;  Laterality: N/A;  CABG times four using left internal mammary artery and left leg saphenous vein, incision made on right leg but no vein removed  . CYSTOSCOPY WITH RETROGRADE PYELOGRAM, URETEROSCOPY AND STENT PLACEMENT Left 08/24/2016   Procedure: CYSTOSCOPY WITH LEFT RETROGRADE PYELOGRAM, LEFT URETEROSCOPY, BASKET EXTRACTION LEFT URETERAL STONE;  Surgeon: Irine Seal, MD;  Location: [facility];  Service: Urology;  Laterality: Left;  . ESOPHAGOGASTRODUODENOSCOPY N/A 08/03/2018   Procedure: ESOPHAGOGASTRODUODENOSCOPY (EGD);  Surgeon: Rogene Houston, MD;  [facility];  Service: Endoscopy;  Laterality: N/A;  2:55  .  EXTRACORPOREAL SHOCK WAVE LITHOTRIPSY Right 06/18/2019   Procedure: EXTRACORPOREAL SHOCK WAVE LITHOTRIPSY (ESWL);  Surgeon: Lucas Mallow, MD;  Location: [facility];  Service: Urology;  Laterality: Right;  . INTRAOPERATIVE TRANSESOPHAGEAL ECHOCARDIOGRAM N/A 10/19/2013   Procedure: INTRAOPERATIVE TRANSESOPHAGEAL ECHOCARDIOGRAM;  Surgeon: Rexene Alberts, MD;  Location: [place];  Service: Open Heart Surgery;  Laterality: N/A;  . JOINT REPLACEMENT    . LEFT HEART CATHETERIZATION WITH CORONARY ANGIOGRAM N/A 10/09/2013   Procedure: LEFT HEART CATHETERIZATION WITH CORONARY ANGIOGRAM;  Surgeon: Burnell Blanks, MD;  Location: [facility];  Service: Cardiovascular;  Laterality: N/A;  . Left shoulder rotator cuff repair    . LITHOTRIPSY    . TOTAL KNEE ARTHROPLASTY Left   . traumaticvamputation of finger  2015  . VASECTOMY      Allergies: No Known Allergies    Home Medications:  Current Outpatient Medications:  .  acetaminophen (TYLENOL) 500 MG tablet, Take 1,000 mg by mouth daily. Take 2 tablets (1000 mg) by mouth in the morning, Disp: , Rfl:  .  allopurinol (ZYLOPRIM) 300 MG tablet, Take 300 mg by mouth daily., Disp: , Rfl:  .  Ascorbic Acid (VITAMIN C) 1000 MG tablet, Take 1,000 mg by mouth daily., Disp: , Rfl:  .  aspirin EC 81 MG tablet, Take 1 tablet (81 mg total) by mouth daily., Disp: 90 tablet, Rfl: 3 .  famotidine (PEPCID) 20 MG tablet, Take 1 tablet (20 mg total) by mouth at bedtime., Disp: , Rfl:  .  Glucosamine-Chondroitin (OSTEO BI-FLEX REGULAR STRENGTH PO), Take 1 tablet by mouth daily., Disp: , Rfl:  .  hydrochlorothiazide (HYDRODIURIL) 50 MG tablet, Take 50 mg by mouth daily., Disp: , Rfl:  .  HYDROcodone-acetaminophen (NORCO/VICODIN) 5-325 MG tablet, Take 1 tablet by mouth 3 (three) times daily as needed for moderate pain., Disp: 21 tablet, Rfl: 0 .  metoprolol tartrate (LOPRESSOR) 25 MG tablet, Take 25 mg by mouth 2 (two) times daily. , Disp: , Rfl:  .  nitroGLYCERIN  (NITROSTAT) 0.4 MG SL tablet, Place 1 tablet (0.4 mg total) under the tongue every 5 (five) minutes as needed., Disp: 25 tablet, Rfl: 3 .  Omega-3 Fatty Acids (FISH OIL) 1200 MG CAPS, Take 500 mg by mouth daily. , Disp: , Rfl:  .  potassium chloride (K-DUR,KLOR-CON) 10 MEQ tablet, Take 10 mEq by mouth daily., Disp: , Rfl:  .  pravastatin (PRAVACHOL) 40 MG tablet, Take 40 mg by mouth every evening. , Disp: , Rfl:  .  pyridostigmine (MESTINON) 60 MG tablet, Take 0.5-1 tablets (30-60 mg total) by mouth 3 (three) times daily., Disp: 90 tablet, Rfl: 6 .  tamsulosin (FLOMAX) 0.4 MG CAPS capsule, Take 0.4 mg by mouth 2 (two) times daily. , Disp: , Rfl:  .  ciprofloxacin (CIPRO) 500 MG tablet, Take 1 tablet (500 mg total) by mouth 2 (two) times daily. (Patient not taking: Reported on 10/08/2019), Disp: 14 tablet, Rfl: 0 .  metroNIDAZOLE (FLAGYL) 250 MG tablet, Take 1 tablet (250 mg total) by mouth 3 (three) times daily. (Patient not taking: Reported on 10/08/2019), Disp: 30 tablet, Rfl: 0 .  Multiple Vitamin (MULITIVITAMIN WITH MINERALS) TABS, Take 1 tablet by mouth daily., Disp: , Rfl:    Family History: family history includes Heart failure in his mother; Hypertension in his sister; Lung cancer in his father.    Social History:   reports that he quit smoking about 36 years ago. His smoking use included cigarettes. He started smoking about 61 years ago. He has a 60.00 pack-year smoking history. He has quit using smokeless tobacco.  His smokeless tobacco use included chew. He reports current alcohol use. He reports that he does not use drugs.   Review of Systems: Constitutional: Denies weight loss/weight gain  Eyes: No changes in vision. ENT: No oral lesions, sore throat.  GI: see HPI.  Heme/Lymph: No easy bruising.  CV: No chest pain.  GU: No hematuria.  Integumentary: No rashes.  Neuro: No headaches.  Psych: No depression/anxiety.  Endocrine: No heat/cold intolerance.  Allergic/Immunologic: No  urticaria.  Resp: No cough, SOB.  Musculoskeletal: No joint swelling.    Physical Examination: BP 126/77 (BP Location: Right Arm, Patient Position: Sitting, Cuff Size: Large)   Pulse (!) 53   Temp (!) 97.5 F (36.4 C) (Temporal)   Ht 6\' 1"  (1.854 m)   Wt 208 lb 1.6 oz (94.4 kg)   BMI 27.46 kg/m  Gen: NAD, alert and oriented x 4 HEENT: PEERLA, EOMI, Neck: supple, no JVD Chest: CTA bilaterally, no wheezes, crackles, or other adventitious  sounds CV: RRR, no m/g/c/r Abd: soft, NT, ND, +BS in all four quadrants; no HSM, guarding, ridigity, or rebound tenderness Ext: no edema, well perfused with 2+ pulses, Skin: no rash or lesions noted on observed skin Lymph: no noted LAD  Data Reviewed:  09/18/19--CBC normal, GI PCR and C. difficile negative.   Assessment/Plan: Mr. Awwad is a 74 y.o. male   1.  Diarrhea gas bloat-symptomatically improved after course of Cipro Flagyl.  He is back to his normal baseline.  He is to contact me if symptoms return. We discussed some diarrhea diet modifications. Consider probiotic if symptoms continue. Given symptom resolution will not evaluate further.   2.  History of colon polyps-due for colonoscopy May 2021, we discussed this today.  We will contact him to schedule at that time.  Denies prior issues with anesthesia, to notify me with health changes in interim.   Patient denies CP, SOB, and use of blood thinners. I discussed the risks and benefits of procedure including bleeding, perforation, infection, missed lesions, medication reactions and possible hospitalization or surgery if complications. All questions answered.     Brandon [name] was seen today for follow-up.  Diagnoses and all orders for this visit:  Personal history of colonic polyps  Diarrhea, unspecified type        I personally performed the service, non-incident to. (WP)  Laurine Blazer, [facility]

## 2019-10-08 NOTE — Telephone Encounter (Signed)
Received fax from [place]: myasthenia gravis full panel lab results. Ordering MD: Dr Shirleen Schirmer. Placed on Dr [employer] desk for review.

## 2019-10-08 NOTE — Telephone Encounter (Signed)
Wife called regarding questions about the COVID vaccine - ph# [phone]

## 2019-10-08 NOTE — Telephone Encounter (Signed)
Called [name] and informed her Dr [name] stated double vision is from [facility]. She stated it doesn't occur every day. He began taking mestinon 60 mg three times daily just two days after seeing Dr [name]. She stated both his eyes are now closing, and she doesn't feel he is any better. She asked if they need to give medication more time. I advised will let Dr [name] know of her question and call her with reply.  [name] verbalized understanding, appreciation.

## 2019-10-08 NOTE — Telephone Encounter (Signed)
Double vision is from [facility]. Continue mestinon up to 60mg  three times a day. -VRP

## 2019-10-08 NOTE — Patient Instructions (Signed)
Please contact us if the symptoms return.  We will plan to contact you regarding colonoscopy later this year.  Please let us know if you have any health changes in the meantime.

## 2019-10-09 NOTE — Telephone Encounter (Signed)
Spoke with wife, Shauna Hugh and informed her Dr [name] stated he may increase pyridostigmine to 90 mg three x daily. He'd take 1.5 tabs. I advised she call for any concerns, questions. He has CT chest Mon; I advised if she hasn't gotten a call with results in a few days after, she may call. She verbalized understanding, appreciation.

## 2019-10-09 NOTE — Telephone Encounter (Signed)
Please notify patient/wife I do not have any issues or concerns from a GI standpoint with him getting the covid vaccine. Can discuss further questions w/ his other physicians. Thanks.

## 2019-10-15 ENCOUNTER — Ambulatory Visit (HOSPITAL_COMMUNITY)
Admission: RE | Admit: 2019-10-15 | Discharge: 2019-10-15 | Disposition: A | Discharge status: Home / Self Care | Payer: Medicare Other | Source: Ambulatory Visit | Attending: Diagnostic Neuroimaging | Admitting: Diagnostic Neuroimaging

## 2019-10-15 ENCOUNTER — Other Ambulatory Visit: Payer: Self-pay

## 2019-10-15 DIAGNOSIS — G7000 Myasthenia gravis without (acute) exacerbation: Secondary | ICD-10-CM

## 2019-10-15 DIAGNOSIS — R911 Solitary pulmonary nodule: Secondary | ICD-10-CM | POA: Diagnosis not present

## 2019-10-15 LAB — POCT I-STAT CREATININE: Creatinine, Ser: 0.8 mg/dL (ref 0.61–1.24)

## 2019-10-15 MED ORDER — IOHEXOL 300 MG/ML  SOLN
75.0000 mL | Freq: Once | INTRAMUSCULAR | Status: AC | PRN
  Administered 2019-10-15: 75 mL via INTRAVENOUS

## 2019-10-17 DIAGNOSIS — H02834 Dermatochalasis of left upper eyelid: Secondary | ICD-10-CM | POA: Diagnosis not present

## 2019-10-17 DIAGNOSIS — G7000 Myasthenia gravis without (acute) exacerbation: Secondary | ICD-10-CM | POA: Diagnosis not present

## 2019-10-17 DIAGNOSIS — Z961 Presence of intraocular lens: Secondary | ICD-10-CM | POA: Diagnosis not present

## 2019-10-17 DIAGNOSIS — H02831 Dermatochalasis of right upper eyelid: Secondary | ICD-10-CM | POA: Diagnosis not present

## 2019-10-18 ENCOUNTER — Telehealth: Payer: Self-pay | Admitting: Diagnostic Neuroimaging

## 2019-10-18 NOTE — Telephone Encounter (Signed)
Pt wife called requesting a CB from RN to touch base in regards to the patients pyridostigmine (MESTINON) 60 MG tablet and to discuss CT scans

## 2019-10-18 NOTE — Telephone Encounter (Addendum)
Called wife and informed her the chest CT showed no thymoma found (looking for this due to myasthenia). There was a small incidental lung nodule noted; Dr [name] recommends PCP follow up of this. I advised she contact Dr Hilma Favors; if he can't see report she can call our MR to have copy faxed. She stated that since 2/23 he's been taking mestinon 1.5 tabs 3 x daily. sometimes his eyes are open , sometimes not. I advised she continue that dose; advised Dr is out of office until next Tues. She'll call next week with update. She verbalized understanding, appreciation.

## 2019-10-19 DIAGNOSIS — J984 Other disorders of lung: Secondary | ICD-10-CM | POA: Diagnosis not present

## 2019-10-19 DIAGNOSIS — G7000 Myasthenia gravis without (acute) exacerbation: Secondary | ICD-10-CM | POA: Diagnosis not present

## 2019-10-19 DIAGNOSIS — Z6830 Body mass index (BMI) 30.0-30.9, adult: Secondary | ICD-10-CM | POA: Diagnosis not present

## 2019-10-19 DIAGNOSIS — E6609 Other obesity due to excess calories: Secondary | ICD-10-CM | POA: Diagnosis not present

## 2019-10-23 ENCOUNTER — Encounter: Payer: Self-pay | Admitting: Diagnostic Neuroimaging

## 2019-10-23 NOTE — Telephone Encounter (Signed)
Pt's wife called to discuss the pyridostigmine (MESTINON) 60 MG tablet with RN. Please advise.

## 2019-10-23 NOTE — Telephone Encounter (Signed)
Called [name] who stated that patient asked her to call. Right eye isn't doing well, left eye is better. He's taking mestinon 1 1/2 tabs three x daily. He has occasional diarrhea, decreased appetite,  tired leg, shoulder and jaw muscles. She also asked if he can take covid vaccine. She then stated he saw PCP re: CT chest that showed lung nodule. PCP stated it is unchanged form 2019; he will follow up on it in a year. I advised will discuss their concerns with Dr [name], call her back. [name]  verbalized understanding, appreciation.

## 2019-10-23 NOTE — Telephone Encounter (Signed)
Reduce mestinon to 60mg  three times a day. Recommend IVIG trial. -VRP

## 2019-10-24 NOTE — Telephone Encounter (Signed)
Called [name] and advised her Dr [name] stated to decrease mestinon to 60 mg three x daily. He recommends a possible trial of IVIG therapy which we can see if he can receive in home.  SHe would like to bring him in to have Dr [name] examine again and explain options for treatment. Scheduled for next Wed, put him on wait list for sooner opening. Wife  verbalized understanding, appreciation.

## 2019-10-31 ENCOUNTER — Other Ambulatory Visit: Payer: Self-pay

## 2019-10-31 ENCOUNTER — Ambulatory Visit (INDEPENDENT_AMBULATORY_CARE_PROVIDER_SITE_OTHER): Payer: Medicare Other | Admitting: Diagnostic Neuroimaging

## 2019-10-31 ENCOUNTER — Encounter: Payer: Self-pay | Admitting: Diagnostic Neuroimaging

## 2019-10-31 VITALS — BP 113/67 | HR 54 | Temp 97.8°F | Ht 73.0 in | Wt 207.4 lb

## 2019-10-31 DIAGNOSIS — G7001 Myasthenia gravis with (acute) exacerbation: Secondary | ICD-10-CM | POA: Diagnosis not present

## 2019-10-31 NOTE — Progress Notes (Signed)
[facility]  [name] DOB: 1945-10-25  REFERRING CLINICIAN: Sharilyn Sites, MD HISTORY FROM: patient and wife  REASON FOR VISIT: follow up    HISTORICAL  CHIEF COMPLAINT:  Chief Complaint  Patient presents with   Ocular myasthenia    rm 6, wife- Brandon [name], discuss IVIG    HISTORY OF PRESENT ILLNESS:   UPDATE (10/31/19, VRP): Since last visit, doing poorly. Now with fatigue, SOB, hand weakness, leg weakness. Mestinon not helping (even up to 90mg  three times a day).  PRIOR HPI (09/24/19): 74 year old male here for evaluation of myasthenia gravis.  September 10, 2019 patient noticed right eye ptosis, progressing to left eye ptosis.  He was having some stomach pain, diarrhea, gas, and breathing issues around the same time.  [name], had [place] R antibody testing which was positive and diagnosed with myasthenia gravis.  Around the same time patient was with a GI doctor, diagnosed with possible bacterial infection and treated with antibiotics.  He was also noted to be heme positive stool.  Since that time I symptoms are stable.  Symptoms are worse in the evening compared the morning.  [name].  Denies any shortness of breath.  Denies any speech or swallowing problems.  Eye drooping is severe enough that sometimes it obstructs his vision and he is not able to drive currently.   REVIEW OF SYSTEMS: Full 14 system review of systems performed and negative with exception of: As per HPI.   ALLERGIES: No Known Allergies  HOME MEDICATIONS: Outpatient Medications Prior to Visit  Medication Sig Dispense Refill   acetaminophen (TYLENOL) 500 MG tablet Take 1,000 mg by mouth daily. Take 2 tablets (1000 mg) by mouth in the morning     allopurinol (ZYLOPRIM) 300 MG tablet Take 300 mg by mouth daily.     Ascorbic Acid (VITAMIN C) 1000 MG tablet Take 1,000 mg by mouth daily.     aspirin EC 81 MG tablet Take 1  tablet (81 mg total) by mouth daily. 90 tablet 3   famotidine (PEPCID) 20 MG tablet Take 1 tablet (20 mg total) by mouth at bedtime.     Glucosamine-Chondroitin (OSTEO BI-FLEX REGULAR STRENGTH PO) Take 1 tablet by mouth daily.     hydrochlorothiazide (HYDRODIURIL) 50 MG tablet Take 50 mg by mouth daily.     HYDROcodone-acetaminophen (NORCO/VICODIN) 5-325 MG tablet Take 1 tablet by mouth 3 (three) times daily as needed for moderate pain. 21 tablet 0   metoprolol tartrate (LOPRESSOR) 25 MG tablet Take 25 mg by mouth 2 (two) times daily.      Multiple Vitamin (MULITIVITAMIN WITH MINERALS) TABS Take 1 tablet by mouth daily.     nitroGLYCERIN (NITROSTAT) 0.4 MG SL tablet Place 1 tablet (0.4 mg total) under the tongue every 5 (five) minutes as needed. 25 tablet 3   Omega-3 Fatty Acids (FISH OIL) 1200 MG CAPS Take 500 mg by mouth daily.      potassium chloride (K-DUR,KLOR-CON) 10 MEQ tablet Take 10 mEq by mouth daily.     pravastatin (PRAVACHOL) 40 MG tablet Take 40 mg by mouth every evening.      pyridostigmine (MESTINON) 60 MG tablet Take 0.5-1 tablets (30-60 mg total) by mouth 3 (three) times daily. 90 tablet 6   tamsulosin (FLOMAX) 0.4 MG CAPS capsule Take 0.4 mg by mouth 2 (two) times daily.      ciprofloxacin (CIPRO) 500 MG tablet Take 1 tablet (500  mg total) by mouth 2 (two) times daily. (Patient not taking: Reported on 10/08/2019) 14 tablet 0   metroNIDAZOLE (FLAGYL) 250 MG tablet Take 1 tablet (250 mg total) by mouth 3 (three) times daily. (Patient not taking: Reported on 10/08/2019) 30 tablet 0   No facility-administered medications prior to visit.    PAST MEDICAL HISTORY: Past Medical History:  Diagnosis Date   Atherosclerotic heart disease of native coronary artery without angina pectoris    Cataract    Coronary atherosclerosis of native coronary artery    Multivessel status post CABG March 2015   DDD (degenerative disc disease), lumbar    Dr. Carloyn [name]   Essential  hypertension, benign    GERD (gastroesophageal reflux disease)    Hard of hearing    Hemorrhoids    History of gout    History of kidney stones    History of [place] spotted fever    Hyperlipidemia, unspecified    Hypertensive heart disease without heart failure    Kidney stones    Lumbago    Lumbago    Melena    Mixed hyperlipidemia    Ocular myasthenia ([place])    Personal history of other diseases of the musculoskeletal system and connective tissue    Plantar fascial fibromatosis    Pleurodynia    Renal calculus or stone 05/22/14   Unspecified abdominal pain    Unspecified osteoarthritis, unspecified site     PAST SURGICAL HISTORY: Past Surgical History:  Procedure Laterality Date   AMPUTATION Right 03/27/2014   Procedure: DEBRIDEMENT AND CLOSURE RIGHT INDEX FINGER;  Surgeon: [name], MD;  Location: [place];  Service: Orthopedics;  Laterality: Right;   BACK SURGERY     BICEPT TENODESIS  11/26/2011   Procedure: BICEPT TENODESIS;  Surgeon: Augustin Schooling, MD;  Location: [place];  Service: Orthopedics;  Laterality: Left;   BIOPSY  08/03/2018   Procedure: BIOPSY;  Surgeon: Rogene Houston, MD;  [facility];  Service: Endoscopy;;  antral   Cataract surgery Left    CHOLECYSTECTOMY N/A 09/22/2018   Procedure: LAPAROSCOPIC CHOLECYSTECTOMY;  Surgeon: Virl Cagey, MD;  Location: [facility];  Service: General;  Laterality: N/A;   COLONOSCOPY     COLONOSCOPY N/A 12/26/2014   Procedure: COLONOSCOPY;  Surgeon: Rogene Houston, MD;  [facility];  Service: Endoscopy;  Laterality: N/A;  730   CORONARY ARTERY BYPASS GRAFT N/A 10/19/2013   Procedure: CORONARY ARTERY BYPASS GRAFTING (CABG);  Surgeon: Rexene Alberts, MD;  Location: [place];  Service: Open Heart Surgery;  Laterality: N/A;  CABG times four using left internal mammary artery and left leg saphenous vein, incision made on right leg but no vein removed   CYSTOSCOPY WITH  RETROGRADE PYELOGRAM, URETEROSCOPY AND STENT PLACEMENT Left 08/24/2016   Procedure: CYSTOSCOPY WITH LEFT RETROGRADE PYELOGRAM, LEFT URETEROSCOPY, BASKET EXTRACTION LEFT URETERAL STONE;  Surgeon: Irine Seal, MD;  Location: [facility];  Service: Urology;  Laterality: Left;   ESOPHAGOGASTRODUODENOSCOPY N/A 08/03/2018   Procedure: ESOPHAGOGASTRODUODENOSCOPY (EGD);  Surgeon: Rogene Houston, MD;  [facility];  Service: Endoscopy;  Laterality: N/A;  2:55   EXTRACORPOREAL SHOCK WAVE LITHOTRIPSY Right 06/18/2019   Procedure: EXTRACORPOREAL SHOCK WAVE LITHOTRIPSY (ESWL);  Surgeon: Lucas Mallow, MD;  [facility]: [facility];  Service: Urology;  Laterality: Right;   INTRAOPERATIVE TRANSESOPHAGEAL ECHOCARDIOGRAM N/A 10/19/2013   Procedure: INTRAOPERATIVE TRANSESOPHAGEAL ECHOCARDIOGRAM;  Surgeon: Rexene Alberts, MD;  Location: [place];  Service: Open Heart Surgery;  Laterality: N/A;   JOINT REPLACEMENT     LEFT HEART CATHETERIZATION WITH CORONARY ANGIOGRAM N/A 10/09/2013   Procedure: LEFT HEART CATHETERIZATION WITH CORONARY ANGIOGRAM;  Surgeon: Burnell Blanks, MD;  Location: [facility] C[facility];  Service: Cardiovascular;  Laterality: N/A;   Left shoulder rotator cuff repair     LITHOTRIPSY     TOTAL KNEE ARTHROPLASTY Left    traumaticvamputation of finger  2015   VASECTOMY      FAMILY HISTORY: Family History  Problem Relation Age of Onset   Hypertension Sister    Lung cancer Father    Heart failure Mother     SOCIAL HISTORY: Social History   Socioeconomic History   Marital status: Married    Spouse name: Brandon [name]   Number of children: 2   Years of education: Not on file   Highest education level: 10th grade  Occupational History   Occupation: Associate Professor [employer]  Tobacco Use   Smoking status: Former Smoker    Packs/day: 2.00    Years: 30.00    Pack years: 60.00    Types: Cigarettes    Start date: 08/16/1958    Quit date: 08/17/1983    Years since quitting: 36.2   Smokeless  tobacco: Former Systems developer    Types: Chew   Tobacco comment: quit smoking 60yrs ago  Substance and Sexual Activity   Alcohol use: Yes    Alcohol/week: 0.0 standard drinks    Comment: Occassional   Drug use: No   Sexual activity: Yes  Other Topics Concern   Not on file  Social History Narrative   Lives with wife   Caffeine- coffee 3- 4 c daily   Social Determinants of Health   Financial Resource Strain:    Difficulty of Paying Living Expenses:   Food Insecurity:    Worried About Charity fundraiser in the Last Year:    Arboriculturist in the Last Year:   Transportation Needs:    Film/video editor (Medical):    Lack of Transportation (Non-Medical):   Physical Activity:    Days of Exercise per Week:    Minutes of Exercise per Session:   Stress:    Feeling of Stress :   Social Connections:    Frequency of Communication with Friends and Family:    Frequency of Social Gatherings with Friends and Family:    Attends Religious Services:    Active Member of Clubs or Organizations:    Attends Music therapist:    Marital Status:   Intimate Partner Violence:    Fear of Current or Ex-Partner:    Emotionally Abused:    Physically Abused:    Sexually Abused:      PHYSICAL EXAM  GENERAL EXAM/CONSTITUTIONAL: Vitals:  Vitals:   10/31/19 1438  BP: 113/67  Pulse: (!) 54  Temp: 97.8 F (36.6 C)  Weight: 207 lb 6.4 oz (94.1 kg)  Height: 6\' 1"  (1.854 m)   Body mass index is 27.36 kg/m. Wt Readings from Last 3 Encounters:  10/31/19 207 lb 6.4 oz (94.1 kg)  10/08/19 208 lb 1.6 oz (94.4 kg)  09/24/19 215 lb (97.5 kg)    Patient is in no distress; well developed, nourished and groomed; neck is supple  CARDIOVASCULAR:  Examination of carotid arteries is normal; no carotid bruits  Regular rate and rhythm, no murmurs  Examination of peripheral vascular system by observation and palpation is normal  EYES:  Ophthalmoscopic exam of optic  discs and posterior segments  is normal; no papilledema or hemorrhages No exam data present  MUSCULOSKELETAL:  Gait, strength, tone, movements noted in Neurologic exam below  NEUROLOGIC: MENTAL STATUS:  No flowsheet data found.  awake, alert, oriented to person, place and time  recent and remote memory intact  normal attention and concentration  language fluent, comprehension intact, naming intact  fund of knowledge appropriate  CRANIAL NERVE:   2nd - no papilledema on fundoscopic exam  2nd, 3rd, 4th, 6th - pupils equal and reactive to light, visual fields full to confrontation, extraocular muscles intact, no nystagmus; BILATERAL PTOSIS (RIGHT > LEFT)  5th - facial sensation symmetric  7th - facial strength symmetric  8th - hearing --> REDUCED  9th - palate elevates symmetrically, uvula midline  11th - shoulder shrug symmetric  12th - tongue protrusion midline  MILD DYSARTHRIA  ABLE TO COUNT TO 50 IN 1 BREATH  MOTOR:   normal bulk and tone  BUE 4 TRICEPS; FINGER ABDUCTION 3; OTHERWISE 5 BLE 3-4 PROX; 5 DISTAL  SENSORY:   normal and symmetric to light touch, temperature, vibration  COORDINATION:   finger-nose-finger, fine finger movements normal  REFLEXES:   deep tendon reflexes TRACE and symmetric  GAIT/STATION:   narrow based gait     DIAGNOSTIC DATA (LABS, IMAGING, TESTING) - I reviewed patient records, labs, notes, testing and imaging myself where available.  Lab Results  Component Value Date   WBC 6.1 09/17/2019   HGB 15.6 09/17/2019   HCT 46.1 09/17/2019   MCV 90.2 09/17/2019   PLT 210 09/17/2019      Component Value Date/Time   NA 138 06/28/2019 0000   K 3.7 06/28/2019 0000   CL 101 06/28/2019 0000   CO2 23 (A) 06/28/2019 0000   GLUCOSE 128 (H) 09/21/2018 1408   BUN 20 06/28/2019 0000   CREATININE 0.80 10/15/2019 1642   CALCIUM 9.4 06/28/2019 0000   PROT 7.0 12/06/2013 0757   ALBUMIN 4.4 06/28/2019 0000   AST 21  06/28/2019 0000   ALT 19 06/28/2019 0000   ALKPHOS 70 06/28/2019 0000   BILITOT 0.7 12/06/2013 0757   GFRNONAA 69 06/28/2019 0000   GFRAA 80 06/28/2019 0000   Lab Results  Component Value Date   CHOL 134 06/28/2019   HDL 49 06/28/2019   LDLCALC 62 06/28/2019   TRIG 157 06/28/2019   CHOLHDL 2.4 12/06/2013   Lab Results  Component Value Date   HGBA1C 5.9 (H) 10/18/2013   No results found for: VITAMINB12 No results found for: TSH  09/17/19 AchR ab - 42 HIGH  10/15/19 CT chest 1. No thymic mass or thymoma. 2.  Aortic Atherosclerosis (ICD10-I70.0). 3. 4.5 mm left lower lobe pulmonary nodule. This is unchanged compared with 04/12/2018. No follow-up needed if patient is low-risk. Non-contrast chest CT can be considered in 12 months if patient is high-risk. This recommendation follows the consensus statement: Guidelines for Management of Incidental Pulmonary Nodules Detected on CT Images: From the Fleischner Society 2017; Radiology 2017; 284:228-243.   ASSESSMENT AND PLAN  74 y.o. year old male here with new onset ptosis, blurred vision, generalized weakness with positive ACH antibody, consistent with generalized myasthenia gravis.    Dx:  1. Myasthenia gravis with exacerbation, generalized ([place])     PLAN:  GENERALIZED MYASTHENIA GRAVIS (exacerbation) - continue pyridostigmine 60mg  three times a day - start IVIG 0.4g/kg IV daily x 5 days (home health asap) - then start prednisone after IVIG treatment; then may consider transition to imuran or cellcept  COVID VACCINE EDUCATION - ok to proceed with COVID vaccine as soon as IVIG treatments are completed  Return in about 3 months (around 01/31/2020).    Penni Bombard, MD [id number], [id number] PM Certified in Neurology, Neurophysiology and Neuroimaging  [facility] [address] [place], [place] [address] [phone]

## 2019-10-31 NOTE — Progress Notes (Signed)
Contacted Carolynn Sayers, [place] via community message for home IVIG services.

## 2019-10-31 NOTE — Patient Instructions (Signed)
GENERALIZED MYASTHENIA GRAVIS (exacerbation) - continue pyridostigmine 60mg  three times a day - start IVIG 0.4g/kg IV daily x 5 days (home health asap) - then start prednisone after IVIG treatment; then may consider transition to imuran or cellcept   COVID VACCINE EDUCATION - ok to proceed with COVID vaccine as soon as IVIG treatments are completed

## 2019-11-01 ENCOUNTER — Telehealth: Payer: Self-pay | Admitting: *Deleted

## 2019-11-01 NOTE — Telephone Encounter (Signed)
[place], spoke with Tamika to obtain Medicare Part D Information-   Medicare part D: bin [id number], pcn: [id number], id [id number], grp: [id number],   p [phone]. Called # provided, was [facility] #- spoke with [employer] who stated I need to speak to different dept,  p [phone]. Call was transferred to [facility] [place] rep who stated privigen is non-formulary. Gerald Stabs, pharmacist stated gamunex C is preferred alternative. Faith began new PA for gamunex C, was denied under Medicare part D because it is for primary immune deficiency admin IV in home. She will fax form to be sure it is completed accurately.

## 2019-11-01 NOTE — Telephone Encounter (Signed)
[employer], spoke with Estill Bamberg re: IVIG start asap. She asked for information to be faxed; she will work on [place] and send over Rx to be signed by MD as soon as PA completed.

## 2019-11-02 NOTE — Telephone Encounter (Signed)
Have not received orders from [facility].  Spoke to [place] then [place].  Estill Bamberg stated did fax about 1130, have not received.  They are to fax again.  Have not received confirmed fax #.  I spoke to wife of pt.  I relayed that referral in process and may be next week before initiating.  She verbalized understanding.  I relayed if worsening sx to seek care at ED.  She understood.  Thanked [place] for call.

## 2019-11-05 NOTE — Telephone Encounter (Signed)
Per Mickel Fuchs, RN, Rx for Octagam 36 Gms daily x 5 days IV received via fax from [place] on 11/02/2019. Rx completed by Dr [name], signed and successfully faxed to [facility]. Sandy RN spoke to wife and advised her of drug approval, informed her the infusions may not start until next week. Wife stated patient is stable at his time,  verbalized understanding, appreciation.

## 2019-11-06 ENCOUNTER — Other Ambulatory Visit: Payer: Self-pay

## 2019-11-06 ENCOUNTER — Inpatient Hospital Stay (HOSPITAL_COMMUNITY)
Admission: EM | Admit: 2019-11-06 | Discharge: 2019-11-10 | DRG: 057 | Disposition: A | Discharge status: Home Health | Payer: Medicare Other | Attending: Internal Medicine | Admitting: Internal Medicine

## 2019-11-06 ENCOUNTER — Emergency Department (HOSPITAL_COMMUNITY): Payer: Medicare Other

## 2019-11-06 ENCOUNTER — Encounter (HOSPITAL_COMMUNITY): Payer: Self-pay | Admitting: Internal Medicine

## 2019-11-06 DIAGNOSIS — E782 Mixed hyperlipidemia: Secondary | ICD-10-CM | POA: Diagnosis present

## 2019-11-06 DIAGNOSIS — I2510 Atherosclerotic heart disease of native coronary artery without angina pectoris: Secondary | ICD-10-CM | POA: Diagnosis present

## 2019-11-06 DIAGNOSIS — Z20822 Contact with and (suspected) exposure to covid-19: Secondary | ICD-10-CM | POA: Diagnosis present

## 2019-11-06 DIAGNOSIS — R0602 Shortness of breath: Secondary | ICD-10-CM | POA: Diagnosis not present

## 2019-11-06 DIAGNOSIS — M5136 Other intervertebral disc degeneration, lumbar region: Secondary | ICD-10-CM | POA: Diagnosis present

## 2019-11-06 DIAGNOSIS — Z7982 Long term (current) use of aspirin: Secondary | ICD-10-CM

## 2019-11-06 DIAGNOSIS — H9190 Unspecified hearing loss, unspecified ear: Secondary | ICD-10-CM | POA: Diagnosis present

## 2019-11-06 DIAGNOSIS — I10 Essential (primary) hypertension: Secondary | ICD-10-CM | POA: Diagnosis present

## 2019-11-06 DIAGNOSIS — Z8249 Family history of ischemic heart disease and other diseases of the circulatory system: Secondary | ICD-10-CM | POA: Diagnosis not present

## 2019-11-06 DIAGNOSIS — Z96652 Presence of left artificial knee joint: Secondary | ICD-10-CM | POA: Diagnosis present

## 2019-11-06 DIAGNOSIS — Z801 Family history of malignant neoplasm of trachea, bronchus and lung: Secondary | ICD-10-CM | POA: Diagnosis not present

## 2019-11-06 DIAGNOSIS — M1990 Unspecified osteoarthritis, unspecified site: Secondary | ICD-10-CM | POA: Diagnosis present

## 2019-11-06 DIAGNOSIS — H02402 Unspecified ptosis of left eyelid: Secondary | ICD-10-CM | POA: Diagnosis present

## 2019-11-06 DIAGNOSIS — G7001 Myasthenia gravis with (acute) exacerbation: Secondary | ICD-10-CM | POA: Diagnosis not present

## 2019-11-06 DIAGNOSIS — Z87891 Personal history of nicotine dependence: Secondary | ICD-10-CM

## 2019-11-06 DIAGNOSIS — G7000 Myasthenia gravis without (acute) exacerbation: Secondary | ICD-10-CM

## 2019-11-06 DIAGNOSIS — K219 Gastro-esophageal reflux disease without esophagitis: Secondary | ICD-10-CM | POA: Diagnosis present

## 2019-11-06 DIAGNOSIS — Z951 Presence of aortocoronary bypass graft: Secondary | ICD-10-CM

## 2019-11-06 DIAGNOSIS — M109 Gout, unspecified: Secondary | ICD-10-CM | POA: Diagnosis present

## 2019-11-06 DIAGNOSIS — R001 Bradycardia, unspecified: Secondary | ICD-10-CM | POA: Diagnosis not present

## 2019-11-06 LAB — BASIC METABOLIC PANEL
Anion gap: 10 (ref 5–15)
BUN: 11 mg/dL (ref 8–23)
CO2: 28 mmol/L (ref 22–32)
Calcium: 9.4 mg/dL (ref 8.9–10.3)
Chloride: 102 mmol/L (ref 98–111)
Creatinine, Ser: 0.93 mg/dL (ref 0.61–1.24)
GFR calc Af Amer: >60 mL/min (ref >60)
GFR calc non Af Amer: >60 mL/min (ref >60)
Glucose, Bld: 101 mg/dL — ABNORMAL HIGH (ref 70–99)
Potassium: 4 mmol/L (ref 3.5–5.1)
Sodium: 140 mmol/L (ref 135–145)

## 2019-11-06 LAB — PHOSPHORUS: Phosphorus: 4.2 mg/dL (ref 2.5–4.6)

## 2019-11-06 LAB — CBC
HCT: 48.1 % (ref 39.0–52.0)
Hemoglobin: 15.7 g/dL (ref 13.0–17.0)
MCH: 30.6 pg (ref 26.0–34.0)
MCHC: 32.6 g/dL (ref 30.0–36.0)
MCV: 93.8 fL (ref 80.0–100.0)
Platelets: 207 10*3/uL (ref 150–400)
RBC: 5.13 MIL/uL (ref 4.22–5.81)
RDW: 12.8 % (ref 11.5–15.5)
WBC: 6.3 10*3/uL (ref 4.0–10.5)
nRBC: 0 % (ref 0.0–0.2)

## 2019-11-06 LAB — MAGNESIUM: Magnesium: 2.1 mg/dL (ref 1.7–2.4)

## 2019-11-06 LAB — SARS CORONAVIRUS 2 (TAT 6-24 HRS): SARS Coronavirus 2: NEGATIVE

## 2019-11-06 MED ORDER — ASPIRIN 81 MG PO CHEW
81.0000 mg | CHEWABLE_TABLET | Freq: Every day | ORAL | Status: DC
  Administered 2019-11-07 – 2019-11-10 (×4): 81 mg via ORAL
  Filled 2019-11-06 (×4): qty 1

## 2019-11-06 MED ORDER — ONDANSETRON HCL 4 MG PO TABS: 4.0000 mg | ORAL_TABLET | Freq: Four times a day (QID) | ORAL | Status: DC | PRN

## 2019-11-06 MED ORDER — ALLOPURINOL 300 MG PO TABS
300.0000 mg | ORAL_TABLET | Freq: Every day | ORAL | Status: DC
  Administered 2019-11-07 – 2019-11-10 (×4): 300 mg via ORAL
  Filled 2019-11-06 (×4): qty 1

## 2019-11-06 MED ORDER — PRAVASTATIN SODIUM 40 MG PO TABS
40.0000 mg | ORAL_TABLET | Freq: Every evening | ORAL | Status: DC
  Administered 2019-11-07 – 2019-11-09 (×3): 40 mg via ORAL
  Filled 2019-11-06 (×4): qty 1

## 2019-11-06 MED ORDER — PYRIDOSTIGMINE BROMIDE 60 MG PO TABS
60.0000 mg | ORAL_TABLET | Freq: Three times a day (TID) | ORAL | Status: DC
  Administered 2019-11-06 – 2019-11-10 (×13): 60 mg via ORAL
  Filled 2019-11-06 (×14): qty 1

## 2019-11-06 MED ORDER — ACETAMINOPHEN 325 MG PO TABS
650.0000 mg | ORAL_TABLET | Freq: Four times a day (QID) | ORAL | Status: DC | PRN
  Administered 2019-11-09 (×2): 650 mg via ORAL
  Filled 2019-11-06 (×2): qty 2

## 2019-11-06 MED ORDER — TAMSULOSIN HCL 0.4 MG PO CAPS
0.4000 mg | ORAL_CAPSULE | Freq: Two times a day (BID) | ORAL | Status: DC
  Administered 2019-11-06 – 2019-11-10 (×8): 0.4 mg via ORAL
  Filled 2019-11-06 (×8): qty 1

## 2019-11-06 MED ORDER — ACETAMINOPHEN 650 MG RE SUPP: 650.0000 mg | Freq: Four times a day (QID) | RECTAL | Status: DC | PRN

## 2019-11-06 MED ORDER — IMMUNE GLOBULIN (HUMAN) 10 GM/100ML IV SOLN
400.0000 mg/kg | INTRAVENOUS | Status: AC
  Administered 2019-11-06 – 2019-11-10 (×5): 40 g via INTRAVENOUS
  Filled 2019-11-06: qty 400
  Filled 2019-11-06: qty 200
  Filled 2019-11-06 (×3): qty 400

## 2019-11-06 MED ORDER — FAMOTIDINE 20 MG PO TABS
20.0000 mg | ORAL_TABLET | Freq: Every day | ORAL | Status: DC
  Administered 2019-11-06 – 2019-11-09 (×4): 20 mg via ORAL
  Filled 2019-11-06 (×4): qty 1

## 2019-11-06 MED ORDER — HYDROCHLOROTHIAZIDE 25 MG PO TABS
50.0000 mg | ORAL_TABLET | Freq: Every day | ORAL | Status: DC
  Administered 2019-11-07 – 2019-11-10 (×4): 50 mg via ORAL
  Filled 2019-11-06 (×4): qty 2

## 2019-11-06 MED ORDER — ENOXAPARIN SODIUM 40 MG/0.4ML ~~LOC~~ SOLN
40.0000 mg | SUBCUTANEOUS | Status: DC
  Administered 2019-11-06 – 2019-11-09 (×4): 40 mg via SUBCUTANEOUS
  Filled 2019-11-06 (×4): qty 0.4

## 2019-11-06 MED ORDER — ASCORBIC ACID 500 MG PO TABS
1000.0000 mg | ORAL_TABLET | Freq: Every day | ORAL | Status: DC
  Administered 2019-11-07 – 2019-11-10 (×4): 1000 mg via ORAL
  Filled 2019-11-06 (×4): qty 2

## 2019-11-06 MED ORDER — ADULT MULTIVITAMIN W/MINERALS CH
1.0000 | ORAL_TABLET | Freq: Every day | ORAL | Status: DC
  Administered 2019-11-07 – 2019-11-10 (×4): 1 via ORAL
  Filled 2019-11-06 (×4): qty 1

## 2019-11-06 MED ORDER — NITROGLYCERIN 0.4 MG SL SUBL: 0.4000 mg | SUBLINGUAL_TABLET | SUBLINGUAL | Status: DC | PRN

## 2019-11-06 MED ORDER — SODIUM CHLORIDE 0.9 % IV SOLN: INTRAVENOUS | Status: DC

## 2019-11-06 MED ORDER — ONDANSETRON HCL 4 MG/2ML IJ SOLN: 4.0000 mg | Freq: Four times a day (QID) | INTRAMUSCULAR | Status: DC | PRN

## 2019-11-06 NOTE — Telephone Encounter (Signed)
Patient is now in [facility] ED. Jeannine Boga with pharmacy and advised her of situation.

## 2019-11-06 NOTE — Progress Notes (Signed)
New Admission Note:  Arrival Method: Via stretcher from ED to 56m11 Mental Orientation: Alert & Oriented x4 Telemetry: CCMD verified. Box #12 Assessment: Completed Skin: Refer to flowsheet IV: Right Wrist Pain: 0/10 Safety Measures: Safety Fall Prevention Plan discussed with patient. Admission: Completed 5 [place] Orientation: Patient has been orientated to the room, unit and the staff.  Orders have been reviewed and are being implemented. Will continue to monitor the patient. Call light has been placed within reach.   Vassie Moselle, RN  Phone Number: [phone]

## 2019-11-06 NOTE — Progress Notes (Signed)
NIF -40, VC  1.9L with good patient effort 

## 2019-11-06 NOTE — ED Triage Notes (Signed)
Pt here sent by doctor for admission for myasthenia gravis. Pt had shortness of breath last night, but denies at present. Pt has no complaints.

## 2019-11-06 NOTE — Consult Note (Addendum)
Neurology Consultation Reason for Consult: Myasthenia gravis Referring Physician: Dr. Theotis Burrow  CC: Shortness of breath  History is obtained from: Patient, wife at bedside, chart review  HPI: Brandon [name] is a 74 y.o. male with recent diagnosis of myasthenia gravis who was sent to emergency department by outpatient provider Dr. [name] for IVIG infusion.   Patient states he started having double vision in end of January.  He was diagnosed with myasthenia gravis and started on Mestinon which he has been taking every day.  However he continues to notice weakness in both his arms as well as legs, trouble chewing.  Yesterday evening he also noticed trouble breathing which made him concerned and he called his outpatient provider Dr. [name] who recommended him to come to ED for IVIG infusion.  Patient also states he had diarrhea and melena in last week of January.  He saw Dr. Salley Slaughter with GI and started on ciprofloxacin as well as metronidazole for possible GI infection.  C. difficile and GI path came back negative at that time.  Denies any other new medications.  Per Dr. Gladstone Lighter note dated 09/24/2019, patient had noticed right eye ptosis progressing to left eye ptosis on January 20 12/2019.  He was seen by "eye doctor" who tested for acetylcholine receptor antibodies which came back positive and patient was diagnosed with myasthenia gravis. (I could not find the actual test results).  CT chest with contrast was performed to look for thymoma which showed no thymic mass or thymoma, 4.5 mm left lower lobe pulmonary nodule with recommendation to follow-up in 12 months if needed.   ROS: A 14 point ROS was performed and is negative except as noted in the HPI.  Past Medical History:  Diagnosis Date  . Atherosclerotic heart disease of native coronary artery without angina pectoris   . Cataract   . Coronary atherosclerosis of native coronary artery    Multivessel status post CABG March 2015   . DDD (degenerative disc disease), lumbar    Dr. Carloyn Manner  . Essential hypertension, benign   . GERD (gastroesophageal reflux disease)   . Hard of hearing   . Hemorrhoids   . History of gout   . History of kidney stones   . History of [facility] spotted fever   . Hyperlipidemia, unspecified   . Hypertensive heart disease without heart failure   . Kidney stones   . Lumbago   . Lumbago   . Melena   . Mixed hyperlipidemia   . Ocular myasthenia ([place])   . Personal history of other diseases of the musculoskeletal system and connective tissue   . Plantar fascial fibromatosis   . Pleurodynia   . Renal calculus or stone 05/22/14  . Unspecified abdominal pain   . Unspecified osteoarthritis, unspecified site     Family History  Problem Relation Age of Onset  . Hypertension Sister   . Lung cancer Father   . Heart failure Mother     Social History:  reports that he quit smoking about 36 years ago. His smoking use included cigarettes. He started smoking about 61 years ago. He has a 60.00 pack-year smoking history. He has quit using smokeless tobacco.  His smokeless tobacco use included chew. He reports current alcohol use. He reports that he does not use drugs.   Exam: Current vital signs: BP 125/89 (BP Location: Right Arm)   Pulse (!) 51   Temp 97.9 F (36.6 C) (Oral)   Resp 17   Ht 6'  1" (1.854 m)   Wt 95.3 kg   SpO2 98%   BMI 27.71 kg/m  Vital signs in last 24 hours: Temp:  [97.9 F (36.6 C)] 97.9 F (36.6 C) (03/23 1117) Pulse Rate:  [51] 51 (03/23 1117) Resp:  [17] 17 (03/23 1117) BP: (125)/(89) 125/89 (03/23 1117) SpO2:  [98 %] 98 % (03/23 1117) Weight:  [95.3 kg] 95.3 kg (03/23 1117)   Physical Exam  Constitutional: Appears well-developed and well-nourished.  Psych: Affect appropriate to situation Eyes: No scleral injection HENT: No OP obstrucion, right eye ptosis Head: Normocephalic.  Cardiovascular: Normal rate and regular rhythm.  Respiratory: Effort  normal, non-labored breathing GI: Soft.  No distension. There is no tenderness.  Skin: WDI  Neuro: AO x3, follows all commands, cranial nerves II to XII grossly intact except right eye ptosis, 4/5 strength in all 4 extremities  I have reviewed labs in epic and the results pertinent to this consultation are: No leukocytosis, normal electrolytes, Ig levels pending  I have reviewed the images obtained:  Chest x-ray: No evidence of active disease.  ASSESSMENT/PLAN: 74 year old male with new diagnosis of myasthenia gravis on Mestinon presented with worsening shortness of breath.   Acute exacerbation of myasthenia gravis -Patient is only on Mestinon, no recent infection, no new medications to precipitate exacerbation.  Recommendations -We will check IgA levels and start IVIG for 5-day course. -Continue Mestinon 60 mg 3 times a day, watch for diarrhea, excessive secretions -Continue aspirin 81 mg as IVIG can lead to hypercoagulable state -Ordered NIF, Vital capacity every 4 hours while awake to monitor for respiratory decompensation -Okay to continue all home medications -Avoid medications that can worsen myasthenia for example antibiotics like fluoroquinolones, aminoglycosides, nitrofurantoin, tetracyclines,etc Do not replace magnesium unless absolutely needed.  Avoid steroids, anticonvulsants like gabapentin, etc. -Discussed the diagnosis, management and plan with patient and his wife at bedside in detail.  All questions answered  Thank you for allowing [place] to participate in the care of this patient.  Neurology will follow.  Please page neuro hospitalist for any further questions after 5 PM.  Jamylah Marinaccio Barbra Sarks

## 2019-11-06 NOTE — Progress Notes (Signed)
NIF -40 

## 2019-11-06 NOTE — Telephone Encounter (Addendum)
Per Dr [name] called Brandon [name] and advised to go forward with Octagam appeal. She will send letter of appeal. Per her request, faxed letters from [place] re: privigen and Gamunex C denials. Called wife, Brandon [name] and advised her of this process. She stated she called insurance yesterday and was told we could call insurance agent to help, Brandon [name] [phone].   Patient got on phone and stated last night he could hardly breathe, had to sit up in a chair, his eyes are worse and his hands are being affected. He denied SOB at this time but has had periods of SOB off and on over past week. I advised will discuss with Dr Tish Frederickson but he most likely needs to go to ED. Will call his back. Per Dr [name] patient needs to go to [facility] ED. Called Brandon [name] and advised her. I assured her I am calling ED Charge RN to advise and Dr [name] is sending note to neuro hospitalist. She stated they will get ready to go now,  verbalized understanding, appreciation. Called [place], spoke with Brandon [name] and advised him of patient coming in by private vehicle. Advised him on patient's current condition. He verbalized understanding, appreciation.

## 2019-11-06 NOTE — Telephone Encounter (Signed)
Pt's [name] on [place] )has called back to inform [name] that the insurance man called her back and told her the appeal was the right thing to do and that after appeal it should be paid.  This is an [name] pt's wife has not asked for a call back.

## 2019-11-06 NOTE — H&P (Signed)
History and Physical    Brandon [name] [id number] DOB: Dec 07, 1945 DOA: 11/06/2019  PCP: Sharilyn Sites, MD  Patient coming from: Home  I have personally briefly reviewed patient's old medical records in [place]  Chief Complaint: Shortness of breath, weakness in hands and legs.  HPI: Brandon [name] is a 74 y.o. male with medical history significant of coronary artery disease status post CABG, hypertension, hyperlipidemia, GERD, gout, ocular myasthenia gravis presents to emergency department due to worsening eye symptoms, weakness in hands, legs and trouble chewing.  Patient diagnosed with ocular myasthenia gravis in January by his eye doctor. He was placed on Mestinon which he has been taking every day as prescribed however recently he noticed worsening of his symptoms and last night he noticed trouble breathing which made him concerned.  He called his outpatient provider Dr. [name] who recommended patient to come to the ER for further evaluation and management.  Patient denies headache, chest pain, fever, chills, cough, congestion, nausea, vomiting, diarrhea, decreased appetite, urinary or bowel changes.  ED Course: Upon arrival: Patient's heart rate was 51, other vital signs within normal limits.  CBC, CMP: Within normal limits.  Chest x-ray negative.  He is maintaining oxygen saturation on room air.  COVID-19 pending.  EDP consulted neurology -recommended to start patient on IVIG.  [facility] hospitalist consulted for admission for exacerbation of myasthenia gravis.    Review of Systems: As per HPI otherwise negative.    Past Medical History:  Diagnosis Date  . Atherosclerotic heart disease of native coronary artery without angina pectoris   . Cataract   . Coronary atherosclerosis of native coronary artery    Multivessel status post CABG March 2015  . DDD (degenerative disc disease), lumbar    Dr. Carloyn Manner  . Essential hypertension, benign   . GERD (gastroesophageal  reflux disease)   . Hard of hearing   . Hemorrhoids   . History of gout   . History of kidney stones   . History of [facility] spotted fever   . Hyperlipidemia, unspecified   . Hypertensive heart disease without heart failure   . Kidney stones   . Lumbago   . Lumbago   . Melena   . Mixed hyperlipidemia   . Ocular myasthenia ([place])   . Personal history of other diseases of the musculoskeletal system and connective tissue   . Plantar fascial fibromatosis   . Pleurodynia   . Renal calculus or stone 05/22/14  . Unspecified abdominal pain   . Unspecified osteoarthritis, unspecified site     Past Surgical History:  Procedure Laterality Date  . AMPUTATION Right 03/27/2014   Procedure: DEBRIDEMENT AND CLOSURE RIGHT INDEX FINGER;  Surgeon: [name], MD;  Location: [place];  Service: Orthopedics;  Laterality: Right;  . BACK SURGERY    . BICEPT TENODESIS  11/26/2011   Procedure: BICEPT TENODESIS;  Surgeon: Augustin Schooling, MD;  Location: [place];  Service: Orthopedics;  Laterality: Left;  . BIOPSY  08/03/2018   Procedure: BIOPSY;  Surgeon: Rogene Houston, MD;  [facility];  Service: Endoscopy;;  antral  . Cataract surgery Left   . CHOLECYSTECTOMY N/A 09/22/2018   Procedure: LAPAROSCOPIC CHOLECYSTECTOMY;  Surgeon: Virl Cagey, MD;  Location: [facility];  Service: General;  Laterality: N/A;  . COLONOSCOPY    . COLONOSCOPY N/A 12/26/2014   Procedure: COLONOSCOPY;  Surgeon: Rogene Houston, MD;  [facility];  Service: Endoscopy;  Laterality: N/A;  730  .  CORONARY ARTERY BYPASS GRAFT N/A 10/19/2013   Procedure: CORONARY ARTERY BYPASS GRAFTING (CABG);  Surgeon: Rexene Alberts, MD;  Location: [place];  Service: Open Heart Surgery;  Laterality: N/A;  CABG times four using left internal mammary artery and left leg saphenous vein, incision made on right leg but no vein removed  . CYSTOSCOPY WITH RETROGRADE PYELOGRAM, URETEROSCOPY AND STENT PLACEMENT Left 08/24/2016    Procedure: CYSTOSCOPY WITH LEFT RETROGRADE PYELOGRAM, LEFT URETEROSCOPY, BASKET EXTRACTION LEFT URETERAL STONE;  Surgeon: Irine Seal, MD;  Location: [facility];  Service: Urology;  Laterality: Left;  . ESOPHAGOGASTRODUODENOSCOPY N/A 08/03/2018   Procedure: ESOPHAGOGASTRODUODENOSCOPY (EGD);  Surgeon: Rogene Houston, MD;  [facility];  Service: Endoscopy;  Laterality: N/A;  2:55  . EXTRACORPOREAL SHOCK WAVE LITHOTRIPSY Right 06/18/2019   Procedure: EXTRACORPOREAL SHOCK WAVE LITHOTRIPSY (ESWL);  Surgeon: Lucas Mallow, MD;  Location: [facility];  Service: Urology;  Laterality: Right;  . INTRAOPERATIVE TRANSESOPHAGEAL ECHOCARDIOGRAM N/A 10/19/2013   Procedure: INTRAOPERATIVE TRANSESOPHAGEAL ECHOCARDIOGRAM;  Surgeon: Rexene Alberts, MD;  Location: [place];  Service: Open Heart Surgery;  Laterality: N/A;  . JOINT REPLACEMENT    . LEFT HEART CATHETERIZATION WITH CORONARY ANGIOGRAM N/A 10/09/2013   Procedure: LEFT HEART CATHETERIZATION WITH CORONARY ANGIOGRAM;  Surgeon: Burnell Blanks, MD;  Location: [facility];  Service: Cardiovascular;  Laterality: N/A;  . Left shoulder rotator cuff repair    . LITHOTRIPSY    . TOTAL KNEE ARTHROPLASTY Left   . traumaticvamputation of finger  2015  . VASECTOMY       reports that he quit smoking about 36 years ago. His smoking use included cigarettes. He started smoking about 61 years ago. He has a 60.00 pack-year smoking history. He has quit using smokeless tobacco.  His smokeless tobacco use included chew. He reports current alcohol use. He reports that he does not use drugs.  No Known Allergies  Family History  Problem Relation Age of Onset  . Hypertension Sister   . Lung cancer Father   . Heart failure Mother     Prior to Admission medications   Medication Sig Start Date End Date Taking? Authorizing Provider  acetaminophen (TYLENOL) 500 MG tablet Take 1,000 mg by mouth daily. Take 2 tablets (1000 mg) by mouth in the morning    [provider]  allopurinol (ZYLOPRIM) 300 MG tablet Take 300 mg by mouth daily.    [provider]  Ascorbic Acid (VITAMIN C) 1000 MG tablet Take 1,000 mg by mouth daily.    [provider]  aspirin EC 81 MG tablet Take 1 tablet (81 mg total) by mouth daily. 08/04/18   Rogene Houston, MD  famotidine (PEPCID) 20 MG tablet Take 1 tablet (20 mg total) by mouth at bedtime. 08/03/18   Rehman, Mechele Dawley, MD  Glucosamine-Chondroitin (OSTEO BI-FLEX REGULAR STRENGTH PO) Take 1 tablet by mouth daily.    [provider]  hydrochlorothiazide (HYDRODIURIL) 50 MG tablet Take 50 mg by mouth daily.    [provider]  HYDROcodone-acetaminophen (NORCO/VICODIN) 5-325 MG tablet Take 1 tablet by mouth 3 (three) times daily as needed for moderate pain. 09/17/19   Rogene Houston, MD  metoprolol tartrate (LOPRESSOR) 25 MG tablet Take 25 mg by mouth 2 (two) times daily.     [provider]  Multiple Vitamin (MULITIVITAMIN WITH MINERALS) TABS Take 1 tablet by mouth daily.    [provider]  nitroGLYCERIN (NITROSTAT) 0.4 MG SL tablet Place 1 tablet (  0.4 mg total) under the tongue every 5 (five) minutes as needed. 04/03/19   Satira Sark, MD  Omega-3 Fatty Acids (FISH OIL) 1200 MG CAPS Take 500 mg by mouth daily.     [provider]  potassium chloride (K-DUR,KLOR-CON) 10 MEQ tablet Take 10 mEq by mouth daily.    [provider]  pravastatin (PRAVACHOL) 40 MG tablet Take 40 mg by mouth every evening.     [provider]  pyridostigmine (MESTINON) 60 MG tablet Take 0.5-1 tablets (30-60 mg total) by mouth 3 (three) times daily. 09/24/19   Penumalli, Earlean Polka, MD  tamsulosin (FLOMAX) 0.4 MG CAPS capsule Take 0.4 mg by mouth 2 (two) times daily.  07/12/16   [provider]    Physical Exam: Vitals:   11/06/19 1117  BP: 125/89  Pulse: (!) 51  Resp: 17  Temp: 97.9 F (36.6 C)  TempSrc: Oral  SpO2: 98%  Weight: 95.3 kg    Height: 6\' 1"  (1.854 m)    Constitutional: NAD, calm, comfortable, communicating well, on room air Eyes: Right eye ptosis noted.   ENMT: Mucous membranes are moist. Posterior pharynx clear of any exudate or lesions.Normal dentition.  Neck: normal, supple, no masses, no thyromegaly Respiratory: clear to auscultation bilaterally, no wheezing, no crackles. Normal respiratory effort. No accessory muscle use.  Cardiovascular: Regular rate and rhythm, no murmurs / rubs / gallops. No extremity edema. 2+ pedal pulses. No carotid bruits.  Abdomen: no tenderness, no masses palpated. No hepatosplenomegaly. Bowel sounds positive.  Musculoskeletal: no clubbing / cyanosis. No joint deformity upper and lower extremities. Good ROM, no contractures. Normal muscle tone.  Skin: no rashes, lesions, ulcers. No induration Neurologic: Right eye ptosis.  Power: 5 out of 5 in all 4 extremities. Psychiatric: Normal judgment and insight. Alert and oriented x 3. Normal mood.    Labs on Admission: I have personally reviewed following labs and imaging studies  CBC: Recent Labs  Lab 11/06/19 1126  WBC 6.3  HGB 15.7  HCT 48.1  MCV 93.8  PLT [id number]   Basic Metabolic Panel: Recent Labs  Lab 11/06/19 1126  NA 140  K 4.0  CL 102  CO2 28  GLUCOSE 101*  BUN 11  CREATININE 0.93  CALCIUM 9.4   GFR: Estimated Creatinine Clearance: 79.9 mL/min (by C-G formula based on SCr of 0.93 mg/dL). Liver Function Tests: No results for input(s): AST, ALT, ALKPHOS, BILITOT, PROT, ALBUMIN in the last 168 hours. No results for input(s): LIPASE, AMYLASE in the last 168 hours. No results for input(s): AMMONIA in the last 168 hours. Coagulation Profile: No results for input(s): INR, PROTIME in the last 168 hours. Cardiac Enzymes: No results for input(s): CKTOTAL, CKMB, CKMBINDEX, TROPONINI in the last 168 hours. BNP (last 3 results) No results for input(s): PROBNP in the last 8760 hours. HbA1C: No results for input(s):  HGBA1C in the last 72 hours. CBG: No results for input(s): GLUCAP in the last 168 hours. Lipid Profile: No results for input(s): CHOL, HDL, LDLCALC, TRIG, CHOLHDL, LDLDIRECT in the last 72 hours. Thyroid Function Tests: No results for input(s): TSH, T4TOTAL, FREET4, T3FREE, THYROIDAB in the last 72 hours. Anemia Panel: No results for input(s): VITAMINB12, FOLATE, FERRITIN, TIBC, IRON, RETICCTPCT in the last 72 hours. Urine analysis:    Component Value Date/Time   COLORURINE YELLOW 10/18/2013 1435   APPEARANCEUR CLEAR 10/18/2013 1435   LABSPEC 1.025 10/18/2013 1435   PHURINE 5.5 10/18/2013 1435   GLUCOSEU NEGATIVE 10/18/2013 1435  HGBUR NEGATIVE 10/18/2013 1435   BILIRUBINUR NEGATIVE 10/18/2013 1435   KETONESUR NEGATIVE 10/18/2013 1435   PROTEINUR NEGATIVE 10/18/2013 1435   UROBILINOGEN 0.2 10/18/2013 1435   NITRITE NEGATIVE 10/18/2013 1435   LEUKOCYTESUR TRACE (A) 10/18/2013 1435    Radiological Exams on Admission: DG Chest 2 View  Result Date: 11/06/2019 CLINICAL DATA:  Shortness of breath and chest tightness EXAM: CHEST - 2 VIEW COMPARISON:  10/15/2019 FINDINGS: Normal heart size. Aortic tortuosity. CABG. Artifact from EKG leads over the right upper chest. Borderline diaphragm flattening. There is no edema, consolidation, effusion, or pneumothorax. IMPRESSION: No evidence of active disease. Electronically Signed   By: Monte Fantasia M.D.   On: 11/06/2019 12:01    EKG: Independently reviewed sinus bradycardia.  No ST elevation or depression noted.  Assessment/Plan Principal Problem:   Myasthenia gravis with (acute) exacerbation (HCC) Active Problems:   Mixed hyperlipidemia   Essential hypertension, benign   Coronary atherosclerosis of native coronary artery   S/P CABG x 4   GERD (gastroesophageal reflux disease)   Acute exacerbation of myasthenia gravis: -Patient recently diagnosed with myasthenia gravis.  He takes Mestinon at home as prescribed by his provider.  He  presented with worsening of his weakness, new onset of shortness of breath. -Admit patient on the floor for close monitoring. -Appreciate neurology help -IgA level is pending.  Continue IVIG for 5 days as per neurology recommendations. -Continue Mestinon 60 mg 3 times daily.  Continue aspirin 81 once daily -NIF, vital capacity every 4 hour while awake -Avoid medicines that can aggravate his myasthenia gravis.  Coronary artery disease status post CABG: -Continue aspirin, statin, hold metoprolol due to bradycardia  Sinus bradycardia: Reviewed EKG. -Heart rate is in between 48-53 -Patient is asymptomatic.  Likely secondary to beta-blocker.  Hold for now -On telemetry.  Continue to monitor.  Hypertension: Well-controlled -Continue HCTZ, hold metoprolol due to bradycardia  Gout: Continue allopurinol  GERD: Continue Pepcid  Hyperlipidemia: Continue statin  DVT prophylaxis: Lovenox/SCD/TED Code Status: Full code-confirmed with the patient Family Communication: Patient's wife present at bedside.  Plan of care discussed with patient and his wife at bedside in length and they verbalized understanding and agreed with it. Disposition Plan: To be determined Consults called: Neurology Admission status: Inpatient   Mckinley Jewel MD [facility] Pager [phone]  If 7PM-7AM, please contact night-coverage [email or web address] Password [facility]  11/06/2019, 3:24 PM

## 2019-11-06 NOTE — ED Provider Notes (Signed)
[facility] EMERGENCY DEPARTMENT Provider Note   CSN: [id number] Arrival date & time: 11/06/19  1106     History No chief complaint on file.   Brandon [name] is a 74 y.o. male.  74yo M w/ PMH including CAD s/p CABG, HTN, HLD who p/w weakness.  Patient has been following with an ophthalmologist for a recent diagnosis of ocular myasthenia gravis.  He has been having worsening eye symptoms as well as weakness in his hands and legs and recent shortness of breath.  He was seen by a neurologist for the first time in February and seen most recently last week.  His neurologist was trying to set up outpatient IVIG infusions but they ran into problems with home health and his symptoms have progressed so he was instructed to come to the ED for admission.  He reports feeling short of breath because he was fatigued last night but his breathing is okay currently.  He also endorses having muscle fatigue in jaws when eating.  He denies infectious symptoms including no fevers, cough/cold, vomiting, abdominal pain, or urinary symptoms.  He is compliant with medications, last dose was this morning.  The history is provided by the patient.       Past Medical History:  Diagnosis Date  . Atherosclerotic heart disease of native coronary artery without angina pectoris   . Cataract   . Coronary atherosclerosis of native coronary artery    Multivessel status post CABG March 2015  . DDD (degenerative disc disease), lumbar    Dr. Carloyn Manner  . Essential hypertension, benign   . GERD (gastroesophageal reflux disease)   . Hard of hearing   . Hemorrhoids   . History of gout   . History of kidney stones   . History of [facility] spotted fever   . Hyperlipidemia, unspecified   . Hypertensive heart disease without heart failure   . Kidney stones   . Lumbago   . Lumbago   . Melena   . Mixed hyperlipidemia   . Ocular myasthenia ([place])   . Personal history of other diseases of the  musculoskeletal system and connective tissue   . Plantar fascial fibromatosis   . Pleurodynia   . Renal calculus or stone 05/22/14  . Unspecified abdominal pain   . Unspecified osteoarthritis, unspecified site     Patient Active Problem List   Diagnosis Date Noted  . Acute diarrhea 09/17/2019  . Heme positive stool 09/17/2019  . Unspecified osteoarthritis, unspecified site   . Hyperlipidemia, unspecified   . Personal history of other diseases of the musculoskeletal system and connective tissue   . Atherosclerotic heart disease of native coronary artery without angina pectoris   . Hypertensive heart disease without heart failure   . Melena   . Plantar fascial fibromatosis   . Pleurodynia   . Calculus of gallbladder without cholecystitis without obstruction 09/14/2018  . Nausea without vomiting 06/05/2018  . Postoperative atrial fibrillation ([place]) 11/09/2013  . S/P CABG x 4 10/19/2013  . Coronary atherosclerosis of native coronary artery 10/18/2013  . Essential hypertension, benign 09/18/2013  . Family history of hypertrophic cardiomyopathy 09/18/2013  . Mixed hyperlipidemia     Past Surgical History:  Procedure Laterality Date  . AMPUTATION Right 03/27/2014   Procedure: DEBRIDEMENT AND CLOSURE RIGHT INDEX FINGER;  Surgeon: [name], MD;  Location: [place];  Service: Orthopedics;  Laterality: Right;  . BACK SURGERY    . BICEPT TENODESIS  11/26/2011   Procedure: BICEPT  TENODESIS;  Surgeon: Augustin Schooling, MD;  Location: [place];  Service: Orthopedics;  Laterality: Left;  . BIOPSY  08/03/2018   Procedure: BIOPSY;  Surgeon: Rogene Houston, MD;  [facility];  Service: Endoscopy;;  antral  . Cataract surgery Left   . CHOLECYSTECTOMY N/A 09/22/2018   Procedure: LAPAROSCOPIC CHOLECYSTECTOMY;  Surgeon: Virl Cagey, MD;  Location: [facility];  Service: General;  Laterality: N/A;  . COLONOSCOPY    . COLONOSCOPY N/A 12/26/2014   Procedure: COLONOSCOPY;  Surgeon: Rogene Houston, MD;  [facility];  Service: Endoscopy;  Laterality: N/A;  730  . CORONARY ARTERY BYPASS GRAFT N/A 10/19/2013   Procedure: CORONARY ARTERY BYPASS GRAFTING (CABG);  Surgeon: Rexene Alberts, MD;  Location: [place];  Service: Open Heart Surgery;  Laterality: N/A;  CABG times four using left internal mammary artery and left leg saphenous vein, incision made on right leg but no vein removed  . CYSTOSCOPY WITH RETROGRADE PYELOGRAM, URETEROSCOPY AND STENT PLACEMENT Left 08/24/2016   Procedure: CYSTOSCOPY WITH LEFT RETROGRADE PYELOGRAM, LEFT URETEROSCOPY, BASKET EXTRACTION LEFT URETERAL STONE;  Surgeon: Irine Seal, MD;  Location: [facility];  Service: Urology;  Laterality: Left;  . ESOPHAGOGASTRODUODENOSCOPY N/A 08/03/2018   Procedure: ESOPHAGOGASTRODUODENOSCOPY (EGD);  Surgeon: Rogene Houston, MD;  [facility];  Service: Endoscopy;  Laterality: N/A;  2:55  . EXTRACORPOREAL SHOCK WAVE LITHOTRIPSY Right 06/18/2019   Procedure: EXTRACORPOREAL SHOCK WAVE LITHOTRIPSY (ESWL);  Surgeon: Lucas Mallow, MD;  Location: [facility];  Service: Urology;  Laterality: Right;  . INTRAOPERATIVE TRANSESOPHAGEAL ECHOCARDIOGRAM N/A 10/19/2013   Procedure: INTRAOPERATIVE TRANSESOPHAGEAL ECHOCARDIOGRAM;  Surgeon: Rexene Alberts, MD;  Location: [place];  Service: Open Heart Surgery;  Laterality: N/A;  . JOINT REPLACEMENT    . LEFT HEART CATHETERIZATION WITH CORONARY ANGIOGRAM N/A 10/09/2013   Procedure: LEFT HEART CATHETERIZATION WITH CORONARY ANGIOGRAM;  Surgeon: Burnell Blanks, MD;  Location: [facility] CATH LAB;  Service: Cardiovascular;  Laterality: N/A;  . Left shoulder rotator cuff repair    . LITHOTRIPSY    . TOTAL KNEE ARTHROPLASTY Left   . traumaticvamputation of finger  2015  . VASECTOMY         Family History  Problem Relation Age of Onset  . Hypertension Sister   . Lung cancer Father   . Heart failure Mother     Social History   Tobacco Use  . Smoking status: Former Smoker     Packs/day: 2.00    Years: 30.00    Pack years: 60.00    Types: Cigarettes    Start date: 08/16/1958    Quit date: 08/17/1983    Years since quitting: 36.2  . Smokeless tobacco: Former Systems developer    Types: Chew  . Tobacco comment: quit smoking 22yrs ago  Substance Use Topics  . Alcohol use: Yes    Alcohol/week: 0.0 standard drinks    Comment: Occassional  . Drug use: No    Home Medications Prior to Admission medications   Medication Sig Start Date End Date Taking? Authorizing Provider  acetaminophen (TYLENOL) 500 MG tablet Take 1,000 mg by mouth daily. Take 2 tablets (1000 mg) by mouth in the morning    [provider]  allopurinol (ZYLOPRIM) 300 MG tablet Take 300 mg by mouth daily.    [provider]  Ascorbic Acid (VITAMIN C) 1000 MG tablet Take 1,000 mg by mouth daily.    [provider]  aspirin EC 81 MG tablet Take  1 tablet (81 mg total) by mouth daily. 08/04/18   Rogene Houston, MD  famotidine (PEPCID) 20 MG tablet Take 1 tablet (20 mg total) by mouth at bedtime. 08/03/18   Rehman, Mechele Dawley, MD  Glucosamine-Chondroitin (OSTEO BI-FLEX REGULAR STRENGTH PO) Take 1 tablet by mouth daily.    [provider]  hydrochlorothiazide (HYDRODIURIL) 50 MG tablet Take 50 mg by mouth daily.    [provider]  HYDROcodone-acetaminophen (NORCO/VICODIN) 5-325 MG tablet Take 1 tablet by mouth 3 (three) times daily as needed for moderate pain. 09/17/19   Rogene Houston, MD  metoprolol tartrate (LOPRESSOR) 25 MG tablet Take 25 mg by mouth 2 (two) times daily.     [provider]  Multiple Vitamin (MULITIVITAMIN WITH MINERALS) TABS Take 1 tablet by mouth daily.    [provider]  nitroGLYCERIN (NITROSTAT) 0.4 MG SL tablet Place 1 tablet (0.4 mg total) under the tongue every 5 (five) minutes as needed. 04/03/19   Satira Sark, MD  Omega-3 Fatty Acids (FISH OIL) 1200 MG CAPS Take 500 mg by mouth daily.     [provider]  potassium  chloride (K-DUR,KLOR-CON) 10 MEQ tablet Take 10 mEq by mouth daily.    [provider]  pravastatin (PRAVACHOL) 40 MG tablet Take 40 mg by mouth every evening.     [provider]  pyridostigmine (MESTINON) 60 MG tablet Take 0.5-1 tablets (30-60 mg total) by mouth 3 (three) times daily. 09/24/19   Penumalli, Earlean Polka, MD  tamsulosin (FLOMAX) 0.4 MG CAPS capsule Take 0.4 mg by mouth 2 (two) times daily.  07/12/16   [provider]    Allergies    Patient has no known allergies.  Review of Systems   Review of Systems All other systems reviewed and are negative except that which was mentioned in HPI  Physical Exam Updated Vital Signs BP 125/89 (BP Location: Right Arm)   Pulse (!) 51   Temp 97.9 F (36.6 C) (Oral)   Resp 17   Ht 6\' 1"  (1.854 m)   Wt 95.3 kg   SpO2 98%   BMI 27.71 kg/m   Physical Exam Vitals and nursing note reviewed.  Constitutional:      General: He is not in acute distress.    Appearance: He is well-developed.     Comments: Awake, alert  HENT:     Head: Normocephalic and atraumatic.  Eyes:     Extraocular Movements: Extraocular movements intact.     Conjunctiva/sclera: Conjunctivae normal.     Pupils: Pupils are equal, round, and reactive to light.     Comments: R eye ptosis, severe  Cardiovascular:     Rate and Rhythm: Normal rate and regular rhythm.     Heart sounds: Normal heart sounds. No murmur.  Pulmonary:     Effort: Pulmonary effort is normal. No respiratory distress.     Breath sounds: Normal breath sounds.  Abdominal:     General: Bowel sounds are normal. There is no distension.     Palpations: Abdomen is soft.     Tenderness: There is no abdominal tenderness.  Musculoskeletal:     Cervical back: Neck supple.  Skin:    General: Skin is warm and dry.  Neurological:     Mental Status: He is alert and oriented to person, place, and time.     Cranial Nerves: No cranial nerve deficit.     Motor: No abnormal muscle  tone.     Deep  Tendon Reflexes: Reflexes are normal and symmetric. Reflexes normal.     Comments: Fluent speech, normal finger-to-nose testing, negative pronator drift, no clonus 4/5 grip strength b/l 5/5 proximal muscle strength BUE 5/5 strength BLE  Psychiatric:        Thought Content: Thought content normal.        Judgment: Judgment normal.     ED Results / Procedures / Treatments   Labs (all labs ordered are listed, but only abnormal results are displayed) Labs Reviewed  BASIC METABOLIC PANEL - Abnormal; Notable for the following components:      Result Value   Glucose, Bld 101 (*)    All other components within normal limits  SARS CORONAVIRUS 2 (TAT 6-24 HRS)  CBC    EKG EKG Interpretation  Date/Time:  Tuesday November 06 2019 11:08:41 EDT Ventricular Rate:  51 PR Interval:  196 QRS Duration: 98 QT Interval:  420 QTC Calculation: 387 R Axis:   53 Text Interpretation: Sinus bradycardia Cannot rule out Anterior infarct , age undetermined Abnormal ECG no ischemic changes Confirmed by Theotis Burrow [id number]) on 11/06/2019 1:18:09 PM   Radiology DG Chest 2 View  Result Date: 11/06/2019 CLINICAL DATA:  Shortness of breath and chest tightness EXAM: CHEST - 2 VIEW COMPARISON:  10/15/2019 FINDINGS: Normal heart size. Aortic tortuosity. CABG. Artifact from EKG leads over the right upper chest. Borderline diaphragm flattening. There is no edema, consolidation, effusion, or pneumothorax. IMPRESSION: No evidence of active disease. Electronically Signed   By: Monte Fantasia M.D.   On: 11/06/2019 12:01    Procedures Procedures (including critical care time)  Medications Ordered in ED Medications  pyridostigmine (MESTINON) tablet 60 mg (60 mg Oral Given 11/06/19 1406)    ED Course  I have reviewed the triage vital signs and the nursing notes.  Pertinent labs that were available during my care of the patient were reviewed by me and considered in my medical decision making (see  chart for details).    MDM Rules/Calculators/A&P                      Comfortable on exam, reassuring vital signs, normal O2 saturation on room air.  Normal work of breathing. NIF -40, VC 1.9L which is reassuring.  I contacted our neurology team here for consultation and recommendations for treatment.  Screening lab work reassuring and patient denies any infectious symptoms.  Ordered patient his afternoon dose of Mestinon.  Discussed admission with [facility] hospitalist, Dr. Doristine Bosworth who will admit for further care.  Final Clinical Impression(s) / ED Diagnoses Final diagnoses:  None    Rx / DC Orders ED Discharge Orders    None       Brysten Reister, Wenda Overland, MD 11/06/19 1536

## 2019-11-06 NOTE — Telephone Encounter (Signed)
Estill Bamberg called back, stated yesterday they got response from insurance that octagam is denied, must have tried hizentra subq or gamunex c first. Appeal octagam or have patient try hizentra; she prefers to appeal octagam but needs MD's input. I advised will call her back asap.

## 2019-11-06 NOTE — Telephone Encounter (Signed)
Received message that wife called last night, stated they haven't heard anything about starting patient's home infusions. LVM for Nigel Berthold, case manager nurse/ [facility] and requested a call back with update.

## 2019-11-06 NOTE — Progress Notes (Signed)
Vital Capacity=2.1L

## 2019-11-07 LAB — BASIC METABOLIC PANEL
Anion gap: 9 (ref 5–15)
BUN: 12 mg/dL (ref 8–23)
CO2: 27 mmol/L (ref 22–32)
Calcium: 9 mg/dL (ref 8.9–10.3)
Chloride: 102 mmol/L (ref 98–111)
Creatinine, Ser: 0.88 mg/dL (ref 0.61–1.24)
GFR calc Af Amer: >60 mL/min (ref >60)
GFR calc non Af Amer: >60 mL/min (ref >60)
Glucose, Bld: 97 mg/dL (ref 70–99)
Potassium: 3.7 mmol/L (ref 3.5–5.1)
Sodium: 138 mmol/L (ref 135–145)

## 2019-11-07 LAB — CBC
HCT: 46.7 % (ref 39.0–52.0)
Hemoglobin: 15.6 g/dL (ref 13.0–17.0)
MCH: 30.6 pg (ref 26.0–34.0)
MCHC: 33.4 g/dL (ref 30.0–36.0)
MCV: 91.6 fL (ref 80.0–100.0)
Platelets: 163 10*3/uL (ref 150–400)
RBC: 5.1 MIL/uL (ref 4.22–5.81)
RDW: 12.8 % (ref 11.5–15.5)
WBC: 4.9 10*3/uL (ref 4.0–10.5)
nRBC: 0 % (ref 0.0–0.2)

## 2019-11-07 MED ORDER — FLUTICASONE PROPIONATE 50 MCG/ACT NA SUSP
2.0000 | Freq: Every day | NASAL | Status: DC
  Administered 2019-11-07 – 2019-11-08 (×2): 2 via NASAL
  Filled 2019-11-07: qty 16

## 2019-11-07 MED ORDER — LORATADINE 10 MG PO TABS
10.0000 mg | ORAL_TABLET | Freq: Every day | ORAL | Status: DC
  Administered 2019-11-07 – 2019-11-10 (×4): 10 mg via ORAL
  Filled 2019-11-07 (×4): qty 1

## 2019-11-07 NOTE — Progress Notes (Addendum)
NEUROLOGY PROGRESS NOTE  Subjective: No significant difference at this point in time.  No complaints.  No double vision.  States he does have some jaw weakness with prolonged chewing.  Denies excessive secretions and/or diarrhea and/or abdominal pain.  Exam: Vitals:   11/07/19 0444 11/07/19 0850  BP: (!) 167/89 (!) 146/78  Pulse: 60 61  Resp: 18 20  Temp: 99 F (37.2 C) 98.1 F (36.7 C)  SpO2: 97% 96%    Physical Exam  Constitutional: Appears well-developed and well-nourished.  Psych: Affect appropriate to situation Eyes: No scleral injection HENT: No OP obstrucion Head: Normocephalic.  Cardiovascular: Normal rate and regular rhythm.  Respiratory: Effort normal, non-labored breathing GI: Soft.  No distension. There is no tenderness.  Skin: WDI Neuro:  Mental Status: Alert, oriented, thought content appropriate.  Speech fluent without evidence of aphasia.  Able to follow 3 step commands without difficulty. Cranial Nerves: II:  Visual fields grossly normal,  III,IV, VI: ptosis present with prolonged upper gaze-ptosis will start on the right eye at approximately 10 seconds, extra-ocular motions intact bilaterally pupils equal, round, reactive to light and accommodation V,VII: smile symmetric, facial light touch sensation normal bilaterally VIII: hearing normal bilaterally IX,X: Palate rises midline XI: bilateral shoulder shrug XII: midline tongue extension Motor: Right : Upper extremity   5/5    Left:     Upper extremity   5/5  Lower extremity   5/5     Lower extremity   5/5 Tone and bulk:normal tone throughout; no atrophy noted -Neck flexion and extension 5/5 Sensory: Pinprick and light touch intact throughout, bilaterally Cerebellar: normal finger-to-nose   Medications:  Scheduled: . allopurinol  300 mg Oral Daily  . vitamin C  1,000 mg Oral Daily  . aspirin  81 mg Oral Daily  . enoxaparin (LOVENOX) injection  40 mg Subcutaneous Q24H  . famotidine  20 mg Oral QHS   . hydrochlorothiazide  50 mg Oral Daily  . multivitamin with minerals  1 tablet Oral Daily  . pravastatin  40 mg Oral QPM  . pyridostigmine  60 mg Oral Q8H  . tamsulosin  0.4 mg Oral BID   Continuous: . Immune Globulin 10% 229 mL/hr at 11/06/19 1822    Pertinent Labs/Diagnostics: IgA pending Vital capacity 2.1 L NIF -40  DG Chest 2 View  Result Date: 11/06/2019 IMPRESSION: No evidence of active disease. Electronically Signed   By: Monte Fantasia M.D.   On: 11/06/2019 12:01   Etta Quill PA-C [facility] [id number]  Assessment:  74 year old male with new diagnosis of myasthenia gravis on Mestinon presenting with shortness of breath in addition to right eye ptosis.  On today's exam patient continues to have right eye ptosis which is exaggerated with upward gaze for 10 seconds.  No shortness of breath at this time.    Impression: -Myasthenia gravis exacerbation  Recommendations: -Continue IVIG for total of 5 doses.  Patient has received 1 dose with second dose to be given today -Continue Mestinon -Speech therapy for swallow screen    .As a rule, the listed drugs should be avoided whenever possible, and patients with MG should be followed closely when any new drug is introduced.   Drugs that may exacerbate MG   Antibiotics   Aminoglycosides: e.g., streptomycin, tobramycin, kanamycin   Quinolones: e.g., ciprofloxacin, levofloxacin, ofloxacin, gatifloxacin   Macrolides: e.g., erythromycin, azithromycin, telithromycin   Nondepolarizing muscle relaxants for surgery   d-Tubocurarine (curare), pancuronium, vecuronium, atracurium   Beta-blocking agents   Propranolol, atenolol,  metoprolol   Local anesthetics and related agents   Procaine, Xylocaine in large amounts   Procainamide (for arrhythmias)   Botulinum toxin   Botox exacerbates weakness.   Quinine derivatives   Quinine, quinidine, chloroquine, mefloquine (Lariam)    Magnesium   Decreases ACh release   Penicillamine   May cause MG   Drugs with important interactions in MG   Cyclosporine   Broad range of drug interactions, which may raise or lower cyclosporine levels   Azathioprine   Avoid allopurinol; combination may result in myelosuppression.  11/07/2019, 9:33 AM  Attending Neurohospitalist Addendum Patient seen and examined with APP/Resident. Agree with the history and physical as documented above. Agree with the plan as documented, which I helped formulate. I have independently reviewed the chart, obtained history, review of systems and examined the patient.I have personally reviewed pertinent head/neck/spine imaging (CT/MRI). Please feel free to call with any questions. --- Amie Portland, MD [facility] Pager: [phone]  If 7pm to 7am, please call on call as listed on AMION.

## 2019-11-07 NOTE — Progress Notes (Signed)
Patient performed NIF and VC with great patient effort. VC 4.0L NIF -40. RT will continue to monitor.

## 2019-11-07 NOTE — Progress Notes (Signed)
PROGRESS NOTE    Brandon [name]  [id number] DOB: Jun 10, 1946 DOA: 11/06/2019 PCP: Sharilyn Sites, MD   Brief Narrative:  Patient is a 74 year old male with history of coronary artery disease status post CABG, hypertension, hyperlipidemia, GERD, gout, ocular myasthenia gravis who presents to the emergency department due to worsening eye symptoms, weakness in hands, legs and trouble chewing,shortness of breath.  He was diagnosed with ocular myasthenia gravis in January.  He was placed on Mestinon.  He was recommended by his outpatient provider to present to the emergency department.  On presentation he was mildly bradycardic otherwise hemodynamically stable.  Chest x-ray did not show any pneumonia.  Neurology was consulted, recommended to start on IVIG which will be continued for 5 days.  Assessment & Plan:   Principal Problem:   Myasthenia gravis with (acute) exacerbation (HCC) Active Problems:   Mixed hyperlipidemia   Essential hypertension, benign   Coronary atherosclerosis of native coronary artery   S/P CABG x 4   GERD (gastroesophageal reflux disease)   Acute exacerbation of myasthenia gravis: Recently diagnosed in January of this year.  Takes Mestinon at home.  Presented with worsening of weakness.  Neurology consulted and following.  Neurology recommended 5 days course of IVIG.  Continue Mestinon 60 mg 3 times a day.  Monitor vital capacity Speech therapy will be consulted for evaluation of swallowing. He was also complaining of shortness of breath on presentation.  Chest x-ray did not show any acute abnormalities.  Coronary artery disease: Status post CABG.  Continue aspirin, statin.  Beta-blocker on hold due to bradycardia.  Sinus bradycardia: As per EKG.  Patient is asymptomatic.  Metoprolol on hold.  Continue telemetry monitoring.  Hypertension: Well controlled.  Continue hydrochlorothiazide.  Gout: On allopurinol  GERD: On Pepcid  Hyperlipidemia: Continue  statin         DVT prophylaxis:Lovenox Code Status: Full Family Communication: Friend present at the bedside Disposition Plan: Patient is from home.  Discharge planning to home after completion of 5 days course of IVIG   Consultants: Neurology  Procedures: None  Antimicrobials:  Anti-infectives (From admission, onward)   None      Subjective:  Patient seen and examined the bedside this morning.  Hemodynamically stable.  Denies any weakness today.  Has mild right eye ptosis.   Objective: Vitals:   11/06/19 1815 11/06/19 2016 11/07/19 0444 11/07/19 0500  BP: (!) 144/89 (!) 161/86 (!) 167/89   Pulse: (!) 51 (!) 58 60   Resp: (!) 22 12 18    Temp:  97.6 F (36.4 C) 99 F (37.2 C)   TempSrc:  Axillary Oral   SpO2: 96% 94% 97%   Weight:    95.4 kg  Height:        Intake/Output Summary (Last 24 hours) at 11/07/2019 0816 Last data filed at 11/07/2019 0601 Gross per 24 hour  Intake 0 ml  Output --  Net 0 ml   Filed Weights   11/06/19 1117 11/07/19 0500  Weight: 95.3 kg 95.4 kg    Examination:  General exam: Appears calm and comfortable ,Not in distress,average built HEENT:PERRL,Oral mucosa moist, Ear/Nose normal on gross exam,right eye ptosis Respiratory system: Bilateral equal air entry, normal vesicular breath sounds, no wheezes or crackles  Cardiovascular system: S1 & S2 heard, RRR. No JVD, murmurs, rubs, gallops or clicks. No pedal edema. Gastrointestinal system: Abdomen is nondistended, soft and nontender. No organomegaly or masses felt. Normal bowel sounds heard. Central nervous system: Alert and oriented. No focal  neurological deficits. Extremities: No edema, no clubbing ,no cyanosis Skin: No rashes, lesions or ulcers,no icterus ,no pallor   Data Reviewed: I have personally reviewed following labs and imaging studies  CBC: Recent Labs  Lab 11/06/19 1126 11/07/19 0429  WBC 6.3 4.9  HGB 15.7 15.6  HCT 48.1 46.7  MCV 93.8 91.6  PLT 207 [id number]    Basic Metabolic Panel: Recent Labs  Lab 11/06/19 1126 11/06/19 1521 11/07/19 0429  NA 140  --  138  K 4.0  --  3.7  CL 102  --  102  CO2 28  --  27  GLUCOSE 101*  --  97  BUN 11  --  12  CREATININE 0.93  --  0.88  CALCIUM 9.4  --  9.0  MG  --  2.1  --   PHOS  --  4.2  --    GFR: Estimated Creatinine Clearance: 84.5 mL/min (by C-G formula based on SCr of 0.88 mg/dL). Liver Function Tests: No results for input(s): AST, ALT, ALKPHOS, BILITOT, PROT, ALBUMIN in the last 168 hours. No results for input(s): LIPASE, AMYLASE in the last 168 hours. No results for input(s): AMMONIA in the last 168 hours. Coagulation Profile: No results for input(s): INR, PROTIME in the last 168 hours. Cardiac Enzymes: No results for input(s): CKTOTAL, CKMB, CKMBINDEX, TROPONINI in the last 168 hours. BNP (last 3 results) No results for input(s): PROBNP in the last 8760 hours. HbA1C: No results for input(s): HGBA1C in the last 72 hours. CBG: No results for input(s): GLUCAP in the last 168 hours. Lipid Profile: No results for input(s): CHOL, HDL, LDLCALC, TRIG, CHOLHDL, LDLDIRECT in the last 72 hours. Thyroid Function Tests: No results for input(s): TSH, T4TOTAL, FREET4, T3FREE, THYROIDAB in the last 72 hours. Anemia Panel: No results for input(s): VITAMINB12, FOLATE, FERRITIN, TIBC, IRON, RETICCTPCT in the last 72 hours. Sepsis Labs: No results for input(s): PROCALCITON, LATICACIDVEN in the last 168 hours.  Recent Results (from the past 240 hour(s))  SARS CORONAVIRUS 2 (TAT 6-24 HRS) Nasopharyngeal Nasopharyngeal Swab     Status: None   Collection Time: 11/06/19  2:18 PM   Specimen: Nasopharyngeal Swab  Result Value Ref Range Status   SARS Coronavirus 2 NEGATIVE NEGATIVE Final    Comment: (NOTE) SARS-CoV-2 target nucleic acids are NOT DETECTED. The SARS-CoV-2 RNA is generally detectable in upper and lower respiratory specimens during the acute phase of infection. Negative results do not  preclude SARS-CoV-2 infection, do not rule out co-infections with other pathogens, and should not be used as the sole basis for treatment or other patient management decisions. Negative results must be combined with clinical observations, patient history, and epidemiological information. The expected result is Negative. Fact Sheet for Patients: SugarRoll.be Fact Sheet for Healthcare Providers: https://www.woods-mathews.com/ This test is not yet approved or cleared by the [place] FDA and  has been authorized for detection and/or diagnosis of SARS-CoV-2 by FDA under an Emergency Use Authorization (EUA). This EUA will remain  in effect (meaning this test can be used) for the duration of the COVID-19 declaration under Section 56 4(b)(1) of the Act, 21 U.S.C. section 360bbb-3(b)(1), unless the authorization is terminated or revoked sooner. Performed at [facility], [place] [address], [place], [place] [address]          Radiology Studies: DG Chest 2 View  Result Date: 11/06/2019 CLINICAL DATA:  Shortness of breath and chest tightness EXAM: CHEST - 2 VIEW COMPARISON:  10/15/2019 FINDINGS: Normal heart  size. Aortic tortuosity. CABG. Artifact from EKG leads over the right upper chest. Borderline diaphragm flattening. There is no edema, consolidation, effusion, or pneumothorax. IMPRESSION: No evidence of active disease. Electronically Signed   By: Monte Fantasia M.D.   On: 11/06/2019 12:01        Scheduled Meds: . allopurinol  300 mg Oral Daily  . vitamin C  1,000 mg Oral Daily  . aspirin  81 mg Oral Daily  . enoxaparin (LOVENOX) injection  40 mg Subcutaneous Q24H  . famotidine  20 mg Oral QHS  . hydrochlorothiazide  50 mg Oral Daily  . multivitamin with minerals  1 tablet Oral Daily  . pravastatin  40 mg Oral QPM  . pyridostigmine  60 mg Oral Q8H  . tamsulosin  0.4 mg Oral BID   Continuous Infusions: . Immune Globulin 10% 229  mL/hr at 11/06/19 1822     LOS: 1 day    Time spent: 25 mins.More than 50% of that time was spent in counseling and/or coordination of care.      Shelly Coss, MD [facility] P3/24/2021, 8:16 AM

## 2019-11-07 NOTE — Progress Notes (Signed)
NIF -40  VC 566ml  Good patient effort.

## 2019-11-08 MED ORDER — SALINE SPRAY 0.65 % NA SOLN
1.0000 | NASAL | Status: DC | PRN
  Administered 2019-11-09: 1 via NASAL
  Filled 2019-11-08: qty 44

## 2019-11-08 NOTE — Progress Notes (Signed)
PROGRESS NOTE    Brandon [name]  [id number] DOB: 1946/05/24 DOA: 11/06/2019 PCP: Sharilyn Sites, MD   Brief Narrative:  Patient is a 74 year old male with history of coronary artery disease status post CABG, hypertension, hyperlipidemia, GERD, gout, ocular myasthenia gravis who presents to the emergency department due to worsening eye symptoms, weakness in hands, legs and trouble chewing,shortness of breath.  He was diagnosed with ocular myasthenia gravis in January.  He was placed on Mestinon.  He was recommended by his outpatient provider to present to the emergency department.  On presentation he was mildly bradycardic otherwise hemodynamically stable.  Chest x-ray did not show any pneumonia.  Neurology was consulted, recommended to start on IVIG which will be continued for 5 days.  Assessment & Plan:   Principal Problem:   Myasthenia gravis with (acute) exacerbation (HCC) Active Problems:   Mixed hyperlipidemia   Essential hypertension, benign   Coronary atherosclerosis of native coronary artery   S/P CABG x 4   GERD (gastroesophageal reflux disease)   Acute exacerbation of myasthenia gravis: Recently diagnosed in January of this year.  Takes Mestinon at home.  Presented with worsening of weakness.  Neurology consulted and following.  Neurology recommended 5 days course of IVIG.  Day 3. Continue Mestinon 60 mg 3 times a day.  Monitor vital capacity Speech therapy will be consulted for evaluation of swallowing. He was also complaining of shortness of breath on presentation.  Chest x-ray did not show any acute abnormalities.  Coronary artery disease: Status post CABG.  Continue aspirin, statin.  Beta-blocker on hold due to bradycardia.  Sinus bradycardia: As per EKG.  Patient is asymptomatic.  Metoprolol on hold.  Continue telemetry monitoring.  Hypertension: Well controlled.  Continue hydrochlorothiazide.  Gout: On allopurinol  GERD: On Pepcid  Hyperlipidemia: Continue  statin         DVT prophylaxis:Lovenox Code Status: Full Family Communication: Patient does not think that we need to call the family for update Disposition Plan: Patient is from home.  Discharge planning to home after completion of 5 days course of IVIG   Consultants: Neurology  Procedures: None  Antimicrobials:  Anti-infectives (From admission, onward)   None      Subjective:  Patient seen and examined the bedside this morning.  Hemodynamically stable.  No active issues.  Still has mild ptosis on the right side   Objective: Vitals:   11/07/19 1900 11/07/19 2112 11/08/19 0426 11/08/19 0858  BP: 140/80 (!) 160/88 (!) 152/80 129/84  Pulse: 61 (!) 56 (!) 58 69  Resp: 18 20 20    Temp: 98.1 F (36.7 C) 97.9 F (36.6 C) 98.3 F (36.8 C) 98 F (36.7 C)  TempSrc: Oral Oral Oral Oral  SpO2: 96% 99% 96% 97%  Weight:      Height:        Intake/Output Summary (Last 24 hours) at 11/08/2019 1203 Last data filed at 11/08/2019 0601 Gross per 24 hour  Intake 975.33 ml  Output 1050 ml  Net -74.67 ml   Filed Weights   11/06/19 1117 11/07/19 0500  Weight: 95.3 kg 95.4 kg    Examination:  General exam: Appears calm and comfortable ,Not in distress,average built HEENT:PERRL,right sided ptosis Respiratory system: Bilateral equal air entry, normal vesicular breath sounds, no wheezes or crackles  Cardiovascular system: S1 & S2 heard, RRR. No JVD, murmurs, rubs, gallops or clicks. Gastrointestinal system: Abdomen is nondistended, soft and nontender. No organomegaly or masses felt. Normal bowel sounds heard. Central nervous  system: Alert and oriented. No focal neurological deficits. Extremities: No edema, no clubbing ,no cyanosis Skin: No rashes, lesions or ulcers,no icterus ,no pallor     Data Reviewed: I have personally reviewed following labs and imaging studies  CBC: Recent Labs  Lab 11/06/19 1126 11/07/19 0429  WBC 6.3 4.9  HGB 15.7 15.6  HCT 48.1 46.7  MCV  93.8 91.6  PLT 207 [id number]   Basic Metabolic Panel: Recent Labs  Lab 11/06/19 1126 11/06/19 1521 11/07/19 0429  NA 140  --  138  K 4.0  --  3.7  CL 102  --  102  CO2 28  --  27  GLUCOSE 101*  --  97  BUN 11  --  12  CREATININE 0.93  --  0.88  CALCIUM 9.4  --  9.0  MG  --  2.1  --   PHOS  --  4.2  --    GFR: Estimated Creatinine Clearance: 84.5 mL/min (by C-G formula based on SCr of 0.88 mg/dL). Liver Function Tests: No results for input(s): AST, ALT, ALKPHOS, BILITOT, PROT, ALBUMIN in the last 168 hours. No results for input(s): LIPASE, AMYLASE in the last 168 hours. No results for input(s): AMMONIA in the last 168 hours. Coagulation Profile: No results for input(s): INR, PROTIME in the last 168 hours. Cardiac Enzymes: No results for input(s): CKTOTAL, CKMB, CKMBINDEX, TROPONINI in the last 168 hours. BNP (last 3 results) No results for input(s): PROBNP in the last 8760 hours. HbA1C: No results for input(s): HGBA1C in the last 72 hours. CBG: No results for input(s): GLUCAP in the last 168 hours. Lipid Profile: No results for input(s): CHOL, HDL, LDLCALC, TRIG, CHOLHDL, LDLDIRECT in the last 72 hours. Thyroid Function Tests: No results for input(s): TSH, T4TOTAL, FREET4, T3FREE, THYROIDAB in the last 72 hours. Anemia Panel: No results for input(s): VITAMINB12, FOLATE, FERRITIN, TIBC, IRON, RETICCTPCT in the last 72 hours. Sepsis Labs: No results for input(s): PROCALCITON, LATICACIDVEN in the last 168 hours.  Recent Results (from the past 240 hour(s))  SARS CORONAVIRUS 2 (TAT 6-24 HRS) Nasopharyngeal Nasopharyngeal Swab     Status: None   Collection Time: 11/06/19  2:18 PM   Specimen: Nasopharyngeal Swab  Result Value Ref Range Status   SARS Coronavirus 2 NEGATIVE NEGATIVE Final    Comment: (NOTE) SARS-CoV-2 target nucleic acids are NOT DETECTED. The SARS-CoV-2 RNA is generally detectable in upper and lower respiratory specimens during the acute phase of infection.  Negative results do not preclude SARS-CoV-2 infection, do not rule out co-infections with other pathogens, and should not be used as the sole basis for treatment or other patient management decisions. Negative results must be combined with clinical observations, patient history, and epidemiological information. The expected result is Negative. Fact Sheet for Patients: SugarRoll.be Fact Sheet for Healthcare Providers: https://www.woods-mathews.com/ This test is not yet approved or cleared by the [place] FDA and  has been authorized for detection and/or diagnosis of SARS-CoV-2 by FDA under an Emergency Use Authorization (EUA). This EUA will remain  in effect (meaning this test can be used) for the duration of the COVID-19 declaration under Section 56 4(b)(1) of the Act, 21 U.S.C. section 360bbb-3(b)(1), unless the authorization is terminated or revoked sooner. Performed at [facility], [place] [address], [place], [place] [address]          Radiology Studies: No results found.      Scheduled Meds: . allopurinol  300 mg Oral Daily  . vitamin  C  1,000 mg Oral Daily  . aspirin  81 mg Oral Daily  . enoxaparin (LOVENOX) injection  40 mg Subcutaneous Q24H  . famotidine  20 mg Oral QHS  . fluticasone  2 spray Each Nare Daily  . hydrochlorothiazide  50 mg Oral Daily  . loratadine  10 mg Oral Daily  . multivitamin with minerals  1 tablet Oral Daily  . pravastatin  40 mg Oral QPM  . pyridostigmine  60 mg Oral Q8H  . tamsulosin  0.4 mg Oral BID   Continuous Infusions: . Immune Globulin 10% Stopped (11/07/19 2201)     LOS: 2 days    Time spent: 25 mins.More than 50% of that time was spent in counseling and/or coordination of care.      Shelly Coss, MD [facility] P3/25/2021, 12:03 PM

## 2019-11-08 NOTE — Progress Notes (Signed)
NIF -60 VC 547ml  Great patient effort.

## 2019-11-08 NOTE — Progress Notes (Signed)
Neurology Progress Note   S:// Seen and examined Breathing much improved. Ptosis still present. Feels better in terms of general strength.    O:// Current vital signs: BP 129/84 (BP Location: Right Arm)   Pulse 69   Temp 98 F (36.7 C) (Oral)   Resp 20   Ht 6\' 1"  (1.854 m)   Wt 95.4 kg   SpO2 97%   BMI 27.75 kg/m  Vital signs in last 24 hours: Temp:  [97.9 F (36.6 C)-98.3 F (36.8 C)] 98 F (36.7 C) (03/25 0858) Pulse Rate:  [56-69] 69 (03/25 0858) Resp:  [18-20] 20 (03/25 0426) BP: (129-160)/(80-91) 129/84 (03/25 0858) SpO2:  [96 %-99 %] 97 % (03/25 0858)  Exam essentially unchanged from yesterday Neuro: Mental Status: Alert, oriented, thought content appropriate.  Speech fluent without evidence of aphasia.  Able to follow 3 step commands without difficulty. Cranial Nerves: II:  Visual fields grossly normal,  III,IV, VI: ptosis present with prolonged upper gaze-ptosis will start on the right eye at approximately 10 seconds, extra-ocular motions intact bilaterally pupils equal, round, reactive to light and accommodation V,VII: smile symmetric, facial light touch sensation normal bilaterally VIII: hearing normal bilaterally IX,X: Palate rises midline XI: bilateral shoulder shrug XII: midline tongue extension Motor: Right :  Upper extremity   5/5                                      Left:     Upper extremity   5/5             Lower extremity   5/5                                                  Lower extremity   5/5 Tone and bulk:normal tone throughout; no atrophy noted -Neck flexion and extension 5/5 Sensory: Pinprick and light touch intact throughout, bilaterally Cerebellar: normal finger-to-nose  Medications  Current Facility-Administered Medications:  .  acetaminophen (TYLENOL) tablet 650 mg, 650 mg, Oral, Q6H PRN **OR** acetaminophen (TYLENOL) suppository 650 mg, 650 mg, Rectal, Q6H PRN, Pahwani, Rinka R, MD .  allopurinol (ZYLOPRIM) tablet 300 mg, 300 mg,  Oral, Daily, Pahwani, Rinka R, MD, 300 mg at 11/08/19 1006 .  ascorbic acid (VITAMIN C) tablet 1,000 mg, 1,000 mg, Oral, Daily, Pahwani, Rinka R, MD, 1,000 mg at 11/08/19 1005 .  aspirin chewable tablet 81 mg, 81 mg, Oral, Daily, Lora Havens, MD, 81 mg at 11/08/19 1006 .  enoxaparin (LOVENOX) injection 40 mg, 40 mg, Subcutaneous, Q24H, Pahwani, Rinka R, MD, 40 mg at 11/07/19 2102 .  famotidine (PEPCID) tablet 20 mg, 20 mg, Oral, QHS, Pahwani, Rinka R, MD, 20 mg at 11/07/19 2101 .  fluticasone (FLONASE) 50 MCG/ACT nasal spray 2 spray, 2 spray, Each Nare, Daily, Adhikari, Amrit, MD, 2 spray at 11/08/19 1004 .  hydrochlorothiazide (HYDRODIURIL) tablet 50 mg, 50 mg, Oral, Daily, Pahwani, Rinka R, MD, 50 mg at 11/08/19 1005 .  Immune Globulin 10% (PRIVIGEN) IV infusion 40 g, 400 mg/kg, Intravenous, Q24 Hr x 5, Lora Havens, MD, Stopped at 11/07/19 2201 .  loratadine (CLARITIN) tablet 10 mg, 10 mg, Oral, Daily, Adhikari, Amrit, MD, 10 mg at 11/08/19 1006 .  multivitamin with minerals tablet 1 tablet, 1 tablet,  Oral, Daily, Pahwani, Rinka R, MD, 1 tablet at 11/08/19 1005 .  nitroGLYCERIN (NITROSTAT) SL tablet 0.4 mg, 0.4 mg, Sublingual, Q5 min PRN, Pahwani, Rinka R, MD .  ondansetron (ZOFRAN) tablet 4 mg, 4 mg, Oral, Q6H PRN **OR** ondansetron (ZOFRAN) injection 4 mg, 4 mg, Intravenous, Q6H PRN, Pahwani, Rinka R, MD .  pravastatin (PRAVACHOL) tablet 40 mg, 40 mg, Oral, QPM, Pahwani, Rinka R, MD, 40 mg at 11/07/19 1726 .  pyridostigmine (MESTINON) tablet 60 mg, 60 mg, Oral, Q8H, Little, Wenda [place], MD, 60 mg at 11/08/19 0603 .  tamsulosin (FLOMAX) capsule 0.4 mg, 0.4 mg, Oral, BID, Pahwani, Rinka R, MD, 0.4 mg at 11/08/19 1006 Labs CBC    Component Value Date/Time   WBC 4.9 11/07/2019 0429   RBC 5.10 11/07/2019 0429   HGB 15.6 11/07/2019 0429   HCT 46.7 11/07/2019 0429   PLT 163 11/07/2019 0429   MCV 91.6 11/07/2019 0429   MCH 30.6 11/07/2019 0429   MCHC 33.4 11/07/2019 0429   RDW  12.8 11/07/2019 0429   LYMPHSABS 1,897 09/17/2019 1456   MONOABS 0.9 03/27/2014 1603   EOSABS 281 09/17/2019 1456   BASOSABS 49 09/17/2019 1456    CMP     Component Value Date/Time   NA 138 11/07/2019 0429   NA 138 06/28/2019 0000   K 3.7 11/07/2019 0429   CL 102 11/07/2019 0429   CO2 27 11/07/2019 0429   GLUCOSE 97 11/07/2019 0429   BUN 12 11/07/2019 0429   BUN 20 06/28/2019 0000   CREATININE 0.88 11/07/2019 0429   CALCIUM 9.0 11/07/2019 0429   PROT 7.0 12/06/2013 0757   ALBUMIN 4.4 06/28/2019 0000   AST 21 06/28/2019 0000   ALT 19 06/28/2019 0000   ALKPHOS 70 06/28/2019 0000   BILITOT 0.7 12/06/2013 0757   GFRNONAA >60 11/07/2019 0429   GFRAA >60 11/07/2019 0429    Imaging No new imaging to review  NIF--60 VC-500 cc  Assessment: Possible myasthenic crisis.  Has good negative inspiratory flow but vital capacities are still low.  Today day 3 of IVIG. We will complete 5 days.  Recommendations: Continue NIF and FVC checks Continue a total of 5 days of IVIG. Likely discharge on Saturday if continues to do well. We will discuss with Dr. [name] tomorrow morning.  -- Amie Portland, MD [facility] Pager: [phone] If 7pm to 7am, please call on call as listed on AMION.

## 2019-11-08 NOTE — Progress Notes (Signed)
NIF >-40 VC 4.1 Liters  Pt. Performed with good effort.

## 2019-11-09 LAB — IGA: IgA: 149 mg/dL (ref 61–437)

## 2019-11-09 MED ORDER — AMLODIPINE BESYLATE 5 MG PO TABS
5.0000 mg | ORAL_TABLET | Freq: Every day | ORAL | Status: DC
  Administered 2019-11-09 – 2019-11-10 (×2): 5 mg via ORAL
  Filled 2019-11-09 (×2): qty 1

## 2019-11-09 NOTE — Progress Notes (Signed)
NIF >-40 VC 4.2 Liters  Patient preformed with good effort.

## 2019-11-09 NOTE — Plan of Care (Signed)
  Problem: Education: Goal: Knowledge of General Education information will improve Description: Including pain rating scale, medication(s)/side effects and non-pharmacologic comfort measures Outcome: Progressing   Problem: Skin Integrity: Goal: Risk for impaired skin integrity will decrease Outcome: Progressing   

## 2019-11-09 NOTE — Evaluation (Signed)
Clinical/Bedside Swallow Evaluation Patient Details  Name: Brandon [name] MRN: [id number] Date of Birth: [birth year]  Today's Date: 11/09/2019 Time: SLP Start Time (ACUTE ONLY): [id number] SLP Stop Time (ACUTE ONLY): 1010 SLP Time Calculation (min) (ACUTE ONLY): 12 min  Past Medical History:  Past Medical History:  Diagnosis Date  . Atherosclerotic heart disease of native coronary artery without angina pectoris   . Cataract   . Coronary atherosclerosis of native coronary artery    Multivessel status post CABG March 2015  . DDD (degenerative disc disease), lumbar    Dr. Carloyn Manner  . Essential hypertension, benign   . GERD (gastroesophageal reflux disease)   . Hard of hearing   . Hemorrhoids   . History of gout   . History of kidney stones   . History of [facility] spotted fever   . Hyperlipidemia, unspecified   . Hypertensive heart disease without heart failure   . Kidney stones   . Lumbago   . Lumbago   . Melena   . Mixed hyperlipidemia   . Ocular myasthenia ([place])   . Personal history of other diseases of the musculoskeletal system and connective tissue   . Plantar fascial fibromatosis   . Pleurodynia   . Renal calculus or stone 05/22/14  . Unspecified abdominal pain   . Unspecified osteoarthritis, unspecified site    Past Surgical History:  Past Surgical History:  Procedure Laterality Date  . AMPUTATION Right 03/27/2014   Procedure: DEBRIDEMENT AND CLOSURE RIGHT INDEX FINGER;  Surgeon: [name], MD;  Location: [place];  Service: Orthopedics;  Laterality: Right;  . BACK SURGERY    . BICEPT TENODESIS  11/26/2011   Procedure: BICEPT TENODESIS;  Surgeon: Augustin Schooling, MD;  Location: [place];  Service: Orthopedics;  Laterality: Left;  . BIOPSY  08/03/2018   Procedure: BIOPSY;  Surgeon: Rogene Houston, MD;  [facility];  Service: Endoscopy;;  antral  . Cataract surgery Left   . CHOLECYSTECTOMY N/A 09/22/2018   Procedure: LAPAROSCOPIC CHOLECYSTECTOMY;  Surgeon:  Virl Cagey, MD;  Location: [facility];  Service: General;  Laterality: N/A;  . COLONOSCOPY    . COLONOSCOPY N/A 12/26/2014   Procedure: COLONOSCOPY;  Surgeon: Rogene Houston, MD;  [facility];  Service: Endoscopy;  Laterality: N/A;  730  . CORONARY ARTERY BYPASS GRAFT N/A 10/19/2013   Procedure: CORONARY ARTERY BYPASS GRAFTING (CABG);  Surgeon: Rexene Alberts, MD;  Location: [place];  Service: Open Heart Surgery;  Laterality: N/A;  CABG times four using left internal mammary artery and left leg saphenous vein, incision made on right leg but no vein removed  . CYSTOSCOPY WITH RETROGRADE PYELOGRAM, URETEROSCOPY AND STENT PLACEMENT Left 08/24/2016   Procedure: CYSTOSCOPY WITH LEFT RETROGRADE PYELOGRAM, LEFT URETEROSCOPY, BASKET EXTRACTION LEFT URETERAL STONE;  Surgeon: Irine Seal, MD;  Location: [facility];  Service: Urology;  Laterality: Left;  . ESOPHAGOGASTRODUODENOSCOPY N/A 08/03/2018   Procedure: ESOPHAGOGASTRODUODENOSCOPY (EGD);  Surgeon: Rogene Houston, MD;  [facility];  Service: Endoscopy;  Laterality: N/A;  2:55  . EXTRACORPOREAL SHOCK WAVE LITHOTRIPSY Right 06/18/2019   Procedure: EXTRACORPOREAL SHOCK WAVE LITHOTRIPSY (ESWL);  Surgeon: Lucas Mallow, MD;  [facility];  Service: Urology;  Laterality: Right;  . INTRAOPERATIVE TRANSESOPHAGEAL ECHOCARDIOGRAM N/A 10/19/2013   Procedure: INTRAOPERATIVE TRANSESOPHAGEAL ECHOCARDIOGRAM;  Surgeon: Rexene Alberts, MD;  Location: [place];  Service: Open Heart Surgery;  Laterality: N/A;  . JOINT REPLACEMENT    . LEFT HEART CATHETERIZATION  WITH CORONARY ANGIOGRAM N/A 10/09/2013   Procedure: LEFT HEART CATHETERIZATION WITH CORONARY ANGIOGRAM;  Surgeon: Burnell Blanks, MD;  Location: [facility] CATH [facility];  Service: Cardiovascular;  Laterality: N/A;  . Left shoulder rotator cuff repair    . LITHOTRIPSY    . TOTAL KNEE ARTHROPLASTY Left   . traumaticvamputation of finger  2015  . VASECTOMY     HPI:  74 year old male with  history of coronary artery disease status post CABG, hypertension, hyperlipidemia, GERD, gout, ocular myasthenia gravis who presents to the emergency department due to worsening eye symptoms, weakness in hands, legs and trouble chewing, shortness of breath.  He was diagnosed with ocular myasthenia gravis in January and has been taking Mestinon. Now on five day course IVIG per neuro.    Assessment / Plan / Recommendation Clinical Impression  Pt presents with normal oropharyngeal swallowing with adequate mastication, no oral residue post-swallow, brisk swallow response, no s/s of aspiration with thin nor mixed consistencies.  Respiratory/swallow coordination is WFL.  No focal CN deficits excluding ptosis right eye.  Recommend continuing current diet.  No SLP f/u is needed. D/W pt.  SLP Visit Diagnosis: Dysphagia, unspecified (R13.10)    Aspiration Risk  No limitations    Diet Recommendation   regular solids, thin liquids  Medication Administration: Whole meds with liquid    Other  Recommendations Oral Care Recommendations: Oral care BID   Follow up Recommendations None      Frequency and Duration            Prognosis        Swallow Study   General HPI: 74 year old male with history of coronary artery disease status post CABG, hypertension, hyperlipidemia, GERD, gout, ocular myasthenia gravis who presents to the emergency department due to worsening eye symptoms, weakness in hands, legs and trouble chewing, shortness of breath.  He was diagnosed with ocular myasthenia gravis in January and has been taking Mestinon. Now on five day course IVIG per neuro.  Type of Study: Bedside Swallow Evaluation Previous Swallow Assessment: no Diet Prior to this Study: Regular;Thin liquids Temperature Spikes Noted: No Respiratory Status: Room air History of Recent Intubation: No Behavior/Cognition: Alert;Cooperative;Pleasant mood Oral Cavity Assessment: Within Functional Limits Oral Care Completed  by SLP: No Oral Cavity - Dentition: Adequate natural dentition Vision: Functional for self-feeding Self-Feeding Abilities: Able to feed self Patient Positioning: Upright in bed Baseline Vocal Quality: Normal Volitional Cough: Strong Volitional Swallow: Able to elicit    Oral/Motor/Sensory Function Overall Oral Motor/Sensory Function: Within functional limits(excluding ptosis right eye)   Ice Chips Ice chips: Within functional limits   Thin Liquid Thin Liquid: Within functional limits    Nectar Thick Nectar Thick Liquid: Not tested   Honey Thick Honey Thick Liquid: Not tested   Puree Puree: Within functional limits   Solid     Solid: Within functional limits      Juan Quam Laurice 11/09/2019,10:15 AM   Estill Bamberg L. Tivis Ringer, [place] Office number [phone] Pager [phone]

## 2019-11-09 NOTE — Progress Notes (Signed)
PROGRESS NOTE    Brandon [name]  [id number] DOB: 1946/06/05 DOA: 11/06/2019 PCP: Sharilyn Sites, MD   Brief Narrative:  Patient is a 74 year old male with history of coronary artery disease status post CABG, hypertension, hyperlipidemia, GERD, gout, ocular myasthenia gravis who presents to the emergency department due to worsening eye symptoms, weakness in hands, legs and trouble chewing,shortness of breath.  He was diagnosed with ocular myasthenia gravis in January.  He was placed on Mestinon.  He was recommended by his outpatient provider to present to the emergency department.  On presentation he was mildly bradycardic otherwise hemodynamically stable.  Chest x-ray did not show any pneumonia.  Neurology was consulted, recommended to start on IVIG which will be continued for 5 days.  Assessment & Plan:   Principal Problem:   Myasthenia gravis with (acute) exacerbation (HCC) Active Problems:   Mixed hyperlipidemia   Essential hypertension, benign   Coronary atherosclerosis of native coronary artery   S/P CABG x 4   GERD (gastroesophageal reflux disease)   Acute exacerbation of myasthenia gravis: Recently diagnosed in January of this year.  Takes Mestinon at home.  Presented with worsening of weakness.  Neurology consulted and following.  Neurology recommended 5 days course of IVIG.  Day 4. Continue Mestinon 60 mg 3 times a day.  Monitor vital capacity He was also complaining of shortness of breath on presentation.  Chest x-ray did not show any acute abnormalities. Speech therapy evaluated him and cleared for solid food.  Coronary artery disease: Status post CABG.  Continue aspirin, statin.  Beta-blocker on hold due to bradycardia.  Sinus bradycardia: As per EKG.  Patient is asymptomatic.  Metoprolol on hold.  Continue telemetry monitoring.  Hypertension: Hypertensive this morning.  Continue to monitor blood pressure.  Continue hydrochlorothiazide.  Added amlodipine  Gout: On  allopurinol  GERD: On Pepcid  Hyperlipidemia: Continue statin         DVT prophylaxis:Lovenox Code Status: Full Family Communication: Patient does not think that we need to call the family for update, understands the plan very well Disposition Plan: Patient is from home.  Discharge planning tomorrow to home  after completion of 5 days course of IVIG   Consultants: Neurology  Procedures: None  Antimicrobials:  Anti-infectives (From admission, onward)   None      Subjective:  Patient seen and examined at the bedside this morning.  Hemodynamically stable.  He was working with speech therapy.  No new complaints.  Ptosis on the right eye has slightly improved.   Objective: Vitals:   11/09/19 1013 11/09/19 1037 11/09/19 1054 11/09/19 1109  BP: (!) 159/87 (!) 159/91 (!) 151/84 (!) 172/86  Pulse: (!) 57 (!) 57 (!) 55 (!) 52  Resp: 20 20 19 18   Temp: 98.2 F (36.8 C) 98.1 F (36.7 C) 97.8 F (36.6 C) 97.9 F (36.6 C)  TempSrc: Oral Oral Oral Oral  SpO2: 98% 98% 98% 99%  Weight:      Height:        Intake/Output Summary (Last 24 hours) at 11/09/2019 1217 Last data filed at 11/09/2019 0846 Gross per 24 hour  Intake 700 ml  Output 0 ml  Net 700 ml   Filed Weights   11/06/19 1117 11/07/19 0500 11/08/19 2116  Weight: 95.3 kg 95.4 kg 91.6 kg    Examination:   General exam: Appears calm and comfortable  HEENT: Right-sided ptosis Respiratory system: Bilateral equal air entry, normal vesicular breath sounds, no wheezes or crackles  Cardiovascular system:  S1 & S2 heard, RRR. No JVD, murmurs, rubs, gallops or clicks. Gastrointestinal system: Abdomen is nondistended, soft and nontender. No organomegaly or masses felt. Normal bowel sounds heard. Central nervous system: Alert and oriented. No focal neurological deficits. Extremities: No edema, no clubbing ,no cyanosis Skin: No rashes, lesions or ulcers,no icterus ,no pallor   Data Reviewed: I have personally reviewed  following labs and imaging studies  CBC: Recent Labs  Lab 11/06/19 1126 11/07/19 0429  WBC 6.3 4.9  HGB 15.7 15.6  HCT 48.1 46.7  MCV 93.8 91.6  PLT 207 [id number]   Basic Metabolic Panel: Recent Labs  Lab 11/06/19 1126 11/06/19 1521 11/07/19 0429  NA 140  --  138  K 4.0  --  3.7  CL 102  --  102  CO2 28  --  27  GLUCOSE 101*  --  97  BUN 11  --  12  CREATININE 0.93  --  0.88  CALCIUM 9.4  --  9.0  MG  --  2.1  --   PHOS  --  4.2  --    GFR: Estimated Creatinine Clearance: 84.5 mL/min (by C-G formula based on SCr of 0.88 mg/dL). Liver Function Tests: No results for input(s): AST, ALT, ALKPHOS, BILITOT, PROT, ALBUMIN in the last 168 hours. No results for input(s): LIPASE, AMYLASE in the last 168 hours. No results for input(s): AMMONIA in the last 168 hours. Coagulation Profile: No results for input(s): INR, PROTIME in the last 168 hours. Cardiac Enzymes: No results for input(s): CKTOTAL, CKMB, CKMBINDEX, TROPONINI in the last 168 hours. BNP (last 3 results) No results for input(s): PROBNP in the last 8760 hours. HbA1C: No results for input(s): HGBA1C in the last 72 hours. CBG: No results for input(s): GLUCAP in the last 168 hours. Lipid Profile: No results for input(s): CHOL, HDL, LDLCALC, TRIG, CHOLHDL, LDLDIRECT in the last 72 hours. Thyroid Function Tests: No results for input(s): TSH, T4TOTAL, FREET4, T3FREE, THYROIDAB in the last 72 hours. Anemia Panel: No results for input(s): VITAMINB12, FOLATE, FERRITIN, TIBC, IRON, RETICCTPCT in the last 72 hours. Sepsis Labs: No results for input(s): PROCALCITON, LATICACIDVEN in the last 168 hours.  Recent Results (from the past 240 hour(s))  SARS CORONAVIRUS 2 (TAT 6-24 HRS) Nasopharyngeal Nasopharyngeal Swab     Status: None   Collection Time: 11/06/19  2:18 PM   Specimen: Nasopharyngeal Swab  Result Value Ref Range Status   SARS Coronavirus 2 NEGATIVE NEGATIVE Final    Comment: (NOTE) SARS-CoV-2 target nucleic acids  are NOT DETECTED. The SARS-CoV-2 RNA is generally detectable in upper and lower respiratory specimens during the acute phase of infection. Negative results do not preclude SARS-CoV-2 infection, do not rule out co-infections with other pathogens, and should not be used as the sole basis for treatment or other patient management decisions. Negative results must be combined with clinical observations, patient history, and epidemiological information. The expected result is Negative. Fact Sheet for Patients: SugarRoll.be Fact Sheet for Healthcare Providers: https://www.woods-mathews.com/ This test is not yet approved or cleared by the [place] FDA and  has been authorized for detection and/or diagnosis of SARS-CoV-2 by FDA under an Emergency Use Authorization (EUA). This EUA will remain  in effect (meaning this test can be used) for the duration of the COVID-19 declaration under Section 56 4(b)(1) of the Act, 21 U.S.C. section 360bbb-3(b)(1), unless the authorization is terminated or revoked sooner. Performed at [facility], [place] [address] [place], [place] [address]  Radiology Studies: No results found.      Scheduled Meds: . allopurinol  300 mg Oral Daily  . vitamin C  1,000 mg Oral Daily  . aspirin  81 mg Oral Daily  . enoxaparin (LOVENOX) injection  40 mg Subcutaneous Q24H  . famotidine  20 mg Oral QHS  . fluticasone  2 spray Each Nare Daily  . hydrochlorothiazide  50 mg Oral Daily  . loratadine  10 mg Oral Daily  . multivitamin with minerals  1 tablet Oral Daily  . pravastatin  40 mg Oral QPM  . pyridostigmine  60 mg Oral Q8H  . tamsulosin  0.4 mg Oral BID   Continuous Infusions: . Immune Globulin 10% 40 g (11/08/19 1245)     LOS: 3 days    Time spent: 25 mins.More than 50% of that time was spent in counseling and/or coordination of care.      Shelly Coss, MD [facility] P3/26/2021,  12:17 PM

## 2019-11-09 NOTE — Plan of Care (Signed)
Briefly seen and examined. Generalized weakness improved. Still has fatigable ptosis. Complete 5 days of IVIG Continue Mestinon at current dose. We will discuss with outpatient neurologist for further plan-likely will need steroid initiation.   -- Amie Portland, MD [facility] Pager: [phone] If 7pm to 7am, please call on call as listed on AMION.

## 2019-11-09 NOTE — Progress Notes (Signed)
NIF > -40 VC 1.6L   Patient performed with good effort.

## 2019-11-10 MED ORDER — AMLODIPINE BESYLATE 5 MG PO TABS: 5.0000 mg | ORAL_TABLET | Freq: Every day | ORAL | 1 refills | Status: AC

## 2019-11-10 MED ORDER — PREDNISONE 50 MG PO TABS
60.0000 mg | ORAL_TABLET | Freq: Every day | ORAL | Status: DC
  Administered 2019-11-10: 60 mg via ORAL
  Filled 2019-11-10: qty 1

## 2019-11-10 MED ORDER — PREDNISONE 20 MG PO TABS: 60.0000 mg | ORAL_TABLET | Freq: Every day | ORAL | 0 refills | Status: DC

## 2019-11-10 MED ORDER — PANTOPRAZOLE SODIUM 40 MG PO TBEC: 40.0000 mg | DELAYED_RELEASE_TABLET | Freq: Every day | ORAL | 1 refills | Status: DC

## 2019-11-10 MED ORDER — PANTOPRAZOLE SODIUM 40 MG PO TBEC
40.0000 mg | DELAYED_RELEASE_TABLET | Freq: Every day | ORAL | Status: DC
  Administered 2019-11-10: 40 mg via ORAL
  Filled 2019-11-10: qty 1

## 2019-11-10 NOTE — Progress Notes (Signed)
Neurology Progress Note   S:// Comfortably sitting in bed, eating breakfast. Offers no complaints.  Feels much better in the mornings and continues to feel better today morning as well.   O:// Current vital signs: BP (!) 150/91 (BP Location: Right Arm)   Pulse 68   Temp 98.2 F (36.8 C) (Oral)   Resp 18   Ht 6\' 1"  (1.854 m)   Wt 91.6 kg   SpO2 96%   BMI 26.65 kg/m  Vital signs in last 24 hours: Temp:  [97.8 F (36.6 C)-98.6 F (37 C)] 98.2 F (36.8 C) (03/27 0434) Pulse Rate:  [52-78] 68 (03/27 0434) Resp:  [18-20] 18 (03/26 1653) BP: (116-172)/(84-97) 150/91 (03/27 0434) SpO2:  [96 %-99 %] 96 % (03/27 0434) Neurological exam Awake alert oriented x3 No dysarthria No aphasia Follows all commands Cranial: Pupils are equal round reactive to light, right eye ptosis is less prominent than prior but on sustained upward gaze again has ptosis in the right eye within 10 to 15 seconds, no facial asymmetry, facial sensation intact, decreased auditory acuity bilaterally. Motor exam: Mild fatigable weakness in all fours but is essentially full strength. Sensory exam: Intact Coordination: No dysmetria Gait testing deferred at this time.  Medications  Current Facility-Administered Medications:  .  acetaminophen (TYLENOL) tablet 650 mg, 650 mg, Oral, Q6H PRN, 650 mg at 11/09/19 1828 **OR** acetaminophen (TYLENOL) suppository 650 mg, 650 mg, Rectal, Q6H PRN, Pahwani, Rinka R, MD .  allopurinol (ZYLOPRIM) tablet 300 mg, 300 mg, Oral, Daily, Pahwani, Rinka R, MD, 300 mg at 11/09/19 0955 .  amLODipine (NORVASC) tablet 5 mg, 5 mg, Oral, Daily, Adhikari, Amrit, MD, 5 mg at 11/09/19 1450 .  ascorbic acid (VITAMIN C) tablet 1,000 mg, 1,000 mg, Oral, Daily, Pahwani, Rinka R, MD, 1,000 mg at 11/09/19 0954 .  aspirin chewable tablet 81 mg, 81 mg, Oral, Daily, Lora Havens, MD, 81 mg at 11/09/19 0955 .  enoxaparin (LOVENOX) injection 40 mg, 40 mg, Subcutaneous, Q24H, Pahwani, Rinka R, MD, 40  mg at 11/09/19 2217 .  famotidine (PEPCID) tablet 20 mg, 20 mg, Oral, QHS, Pahwani, Rinka R, MD, 20 mg at 11/09/19 2216 .  fluticasone (FLONASE) 50 MCG/ACT nasal spray 2 spray, 2 spray, Each Nare, Daily, Adhikari, Amrit, MD, 2 spray at 11/08/19 1004 .  hydrochlorothiazide (HYDRODIURIL) tablet 50 mg, 50 mg, Oral, Daily, Pahwani, Rinka R, MD, 50 mg at 11/09/19 0955 .  Immune Globulin 10% (PRIVIGEN) IV infusion 40 g, 400 mg/kg, Intravenous, Q24 Hr x 5, Yadav, Priyanka O, MD, Last Rate: 28.6 mL/hr at 11/08/19 1245, 40 g at 11/09/19 1019 .  loratadine (CLARITIN) tablet 10 mg, 10 mg, Oral, Daily, Adhikari, Amrit, MD, 10 mg at 11/09/19 0955 .  multivitamin with minerals tablet 1 tablet, 1 tablet, Oral, Daily, Pahwani, Rinka R, MD, 1 tablet at 11/09/19 0955 .  nitroGLYCERIN (NITROSTAT) SL tablet 0.4 mg, 0.4 mg, Sublingual, Q5 min PRN, Pahwani, Rinka R, MD .  ondansetron (ZOFRAN) tablet 4 mg, 4 mg, Oral, Q6H PRN **OR** ondansetron (ZOFRAN) injection 4 mg, 4 mg, Intravenous, Q6H PRN, Pahwani, Rinka R, MD .  pravastatin (PRAVACHOL) tablet 40 mg, 40 mg, Oral, QPM, Pahwani, Rinka R, MD, 40 mg at 11/09/19 1656 .  pyridostigmine (MESTINON) tablet 60 mg, 60 mg, Oral, Q8H, Little, Wenda Overland, MD, 60 mg at 11/10/19 [phone] .  sodium chloride (OCEAN) 0.65 % nasal spray 1 spray, 1 spray, Each Nare, PRN, Shelly Coss, MD, 1 spray at 11/09/19 0544 .  tamsulosin (  FLOMAX) capsule 0.4 mg, 0.4 mg, Oral, BID, Pahwani, Rinka R, MD, 0.4 mg at 11/09/19 2216 Labs CBC    Component Value Date/Time   WBC 4.9 11/07/2019 0429   RBC 5.10 11/07/2019 0429   HGB 15.6 11/07/2019 0429   HCT 46.7 11/07/2019 0429   PLT 163 11/07/2019 0429   MCV 91.6 11/07/2019 0429   MCH 30.6 11/07/2019 0429   MCHC 33.4 11/07/2019 0429   RDW 12.8 11/07/2019 0429   LYMPHSABS 1,897 09/17/2019 1456   MONOABS 0.9 03/27/2014 1603   EOSABS 281 09/17/2019 1456   BASOSABS 49 09/17/2019 1456    CMP     Component Value Date/Time   NA 138 11/07/2019  0429   NA 138 06/28/2019 0000   K 3.7 11/07/2019 0429   CL 102 11/07/2019 0429   CO2 27 11/07/2019 0429   GLUCOSE 97 11/07/2019 0429   BUN 12 11/07/2019 0429   BUN 20 06/28/2019 0000   CREATININE 0.88 11/07/2019 0429   CALCIUM 9.0 11/07/2019 0429   PROT 7.0 12/06/2013 0757   ALBUMIN 4.4 06/28/2019 0000   AST 21 06/28/2019 0000   ALT 19 06/28/2019 0000   ALKPHOS 70 06/28/2019 0000   BILITOT 0.7 12/06/2013 0757   GFRNONAA >60 11/07/2019 0429   GFRAA >60 11/07/2019 0429   Assessment: Myasthenic crisis.  Continues to have good negative inspiratory flow and vital capacities.  Negative inspiratory flow greater than -40 and vital capacity 1.6 L. Ptosis seems to be improving although not normal yet. We will complete 5 days of IVIG today. We will initiate steroids  Impression: Myasthenia gravis crisis  Recommendations: Complete fifth dose of IVIG today. Start prednisone 60 mg daily.  Outpatient neurology to follow-up with duration of steroids and initiation of steroid sparing treatment. Continue home dose of Mestinon 60 mg every 8 hours. Follow-up with Dr. [name] in [facility] neurology clinic in 2 to 4 weeks. Relayed my plan to [facility] neurology will be available.  Please call with questions.  -- Amie Portland, MD [facility] Pager: [phone] If 7pm to 7am, please call on call as listed on AMION.

## 2019-11-10 NOTE — Progress Notes (Signed)
DISCHARGE NOTE HOME Fredrich Heisser to be discharged Home per MD order. Discussed prescriptions and follow up appointments with the patient. Prescriptions given to patient; medication list explained in detail. Patient verbalized understanding.  Skin clean, dry and intact without evidence of skin break down, no evidence of skin tears noted. IV catheter discontinued intact. Site without signs and symptoms of complications. Dressing and pressure applied. Pt denies pain at the site currently. No complaints noted.  Patient free of lines, drains, and wounds.   An After Visit Summary (AVS) was printed and given to the patient. Patient escorted via wheelchair, and discharged home via private auto.  Aneta Mins BSN, RN3

## 2019-11-10 NOTE — Discharge Summary (Addendum)
Physician Discharge Summary  Brandon [name] [id number] DOB: June 01, 1946 DOA: 11/06/2019  PCP: Sharilyn Sites, MD  Admit date: 11/06/2019 Discharge date: 11/10/2019  Admitted From: Home Disposition:  Home  Discharge Condition:Stable CODE STATUS:FULL Diet recommendation: Heart Healthy   Brief/Interim Summary: Patient is a 74 year old male with history of coronary artery disease status post CABG, hypertension, hyperlipidemia, GERD, gout, ocular myasthenia gravis who presents to the emergency department due to worsening eye symptoms, weakness in hands, legs and trouble chewing,shortness of breath.  He was diagnosed with ocular myasthenia gravis in January.  He was placed on Mestinon.  He was recommended by his outpatient provider to present to the emergency department.  On presentation he was mildly bradycardic otherwise hemodynamically stable.  Chest x-ray did not show any pneumonia.  Neurology was consulted, recommended to start on IVIG .  He completed 5 days course.  He has been started on prednisone on discharge.  He will follow-up with his neurologist as an outpatient.  Following problems were addressed during his hospitalization:  Acute exacerbation of myasthenia gravis: Recently diagnosed in January of this year.  Takes Mestinon at home.  Presented with worsening of weakness.  Neurology consulted and following.  Neurology recommended 5 days course of IVIG ehich he completed.Continue Mestinon 60 mg 3 times a day and prednisone 60 mg at home. He was also complaining of shortness of breath on presentation. Chest x-ray did not show any acute abnormalities. Speech therapy evaluated him and cleared for solid food.  Coronary artery disease: Status post CABG.  Continue aspirin, statin.  Beta-blocker on hold due to bradycardia.  Follow-up with cardiology recommended.  He is asymptomatic from bradycardia.  Hypertension:  Continue hydrochlorothiazide.  Added amlodipine.  Metoprolol held due to  bradycardia.  Gout: On allopurinol  GERD: On Pepcid  Hyperlipidemia: Continue statin   Discharge Diagnoses:  Principal Problem:   Myasthenia gravis with (acute) exacerbation (HCC) Active Problems:   Mixed hyperlipidemia   Essential hypertension, benign   Coronary atherosclerosis of native coronary artery   S/P CABG x 4   GERD (gastroesophageal reflux disease)    Discharge Instructions  Discharge Instructions    Diet - low sodium heart healthy   Complete by: As directed    Discharge instructions   Complete by: As directed    1)Take prescribed medications as instructed 2)Follow up with your neurologist in 2 weeks. 3)We have stopped metoprolol due to your slow heart rate.  Please follow-up with your cardiologist as an outpatient before resuming it.   Increase activity slowly   Complete by: As directed      Allergies as of 11/10/2019   No Known Allergies     Medication List    STOP taking these medications   metoprolol tartrate 25 MG tablet Commonly known as: LOPRESSOR     TAKE these medications   acetaminophen 500 MG tablet Commonly known as: TYLENOL Take 1,000 mg by mouth daily.   allopurinol 300 MG tablet Commonly known as: ZYLOPRIM Take 300 mg by mouth daily.   amLODipine 5 MG tablet Commonly known as: NORVASC Take 1 tablet (5 mg total) by mouth daily. Start taking on: November 11, 2019   aspirin EC 81 MG tablet Take 1 tablet (81 mg total) by mouth daily.   famotidine 20 MG tablet Commonly known as: PEPCID Take 1 tablet (20 mg total) by mouth at bedtime.   Fish Oil 500 MG Caps Take 500 mg by mouth daily.   guaiFENesin 600 MG 12 hr tablet  Commonly known as: MUCINEX Take 600 mg by mouth 2 (two) times daily as needed for cough or to loosen phlegm.   hydrochlorothiazide 50 MG tablet Commonly known as: HYDRODIURIL Take 50 mg by mouth daily.   HYDROcodone-acetaminophen 5-325 MG tablet Commonly known as: NORCO/VICODIN Take 1 tablet by mouth 3  (three) times daily as needed for moderate pain.   multivitamin with minerals Tabs tablet Take 1 tablet by mouth daily.   nitroGLYCERIN 0.4 MG SL tablet Commonly known as: NITROSTAT Place 1 tablet (0.4 mg total) under the tongue every 5 (five) minutes as needed.   OSTEO BI-FLEX REGULAR STRENGTH PO Take 1 tablet by mouth daily.   oxymetazoline 0.05 % nasal spray Commonly known as: AFRIN Place 1 spray into both nostrils 2 (two) times daily as needed for congestion.   pantoprazole 40 MG tablet Commonly known as: Protonix Take 1 tablet (40 mg total) by mouth daily.   potassium chloride 10 MEQ tablet Commonly known as: KLOR-CON Take 10 mEq by mouth daily.   pravastatin 40 MG tablet Commonly known as: PRAVACHOL Take 40 mg by mouth every evening.   predniSONE 20 MG tablet Commonly known as: Deltasone Take 3 tablets (60 mg total) by mouth daily.   pyridostigmine 60 MG tablet Commonly known as: MESTINON Take 0.5-1 tablets (30-60 mg total) by mouth 3 (three) times daily. What changed: how much to take   tamsulosin 0.4 MG Caps capsule Commonly known as: FLOMAX Take 0.4 mg by mouth 2 (two) times daily.   vitamin C 1000 MG tablet Take 1,000 mg by mouth daily.      Follow-up Information    Penumalli, Earlean Polka, MD. Schedule an appointment as soon as possible for a visit in 2 day(s).   Specialties: Neurology, Radiology Contact information: [address] [place] [address] [phone]          No Known Allergies  Consultations:  Neurology   Procedures/Studies: DG Chest 2 View  Result Date: 11/06/2019 CLINICAL DATA:  Shortness of breath and chest tightness EXAM: CHEST - 2 VIEW COMPARISON:  10/15/2019 FINDINGS: Normal heart size. Aortic tortuosity. CABG. Artifact from EKG leads over the right upper chest. Borderline diaphragm flattening. There is no edema, consolidation, effusion, or pneumothorax. IMPRESSION: No evidence of active disease.  Electronically Signed   By: Monte Fantasia M.D.   On: 11/06/2019 12:01   CT CHEST W CONTRAST  Result Date: 10/16/2019 CLINICAL DATA:  Myasthenia gravis. Bloating, diarrhea, upper abdominal pain, droopy eyelid EXAM: CT CHEST WITH CONTRAST TECHNIQUE: Multidetector CT imaging of the chest was performed during intravenous contrast administration. CONTRAST:  75mL OMNIPAQUE IOHEXOL 300 MG/ML  SOLN COMPARISON:  CT abdomen 04/12/2018 FINDINGS: Cardiovascular: No significant vascular findings. Normal heart size. No pericardial effusion. Prior CABG. Thoracic aorta is normal in caliber. Mild thoracic aortic atherosclerosis. Mediastinum/Nodes: No enlarged mediastinal, hilar, or axillary lymph nodes. Thyroid gland, trachea, and esophagus demonstrate no significant findings. No evidence of a thymoma or thymic mass. Lungs/Pleura: No focal consolidation, pleural effusion or pneumothorax. 4.5 mm left lower lobe pulmonary nodule. Upper Abdomen: No acute abnormality. Musculoskeletal: No acute osseous abnormality aggressive osseous lesion. IMPRESSION: 1. No thymic mass or thymoma. 2.  Aortic Atherosclerosis (ICD10-I70.0). 3. 4.5 mm left lower lobe pulmonary nodule. This is unchanged compared with 04/12/2018. No follow-up needed if patient is low-risk. Non-contrast chest CT can be considered in 12 months if patient is high-risk. This recommendation follows the consensus statement: Guidelines for Management of Incidental Pulmonary Nodules Detected on  CT Images: From the Fleischner Society 2017; Radiology 2017; [phone]. Electronically Signed   By: Kathreen Devoid   On: 10/16/2019 08:33      Subjective: Patient seen and examined the bedside this morning.  Hemodynamically  stable for discharge today.  Discharge Exam: Vitals:   11/10/19 1012 11/10/19 1036  BP: (!) 147/86 (!) 142/91  Pulse: (!) 58 (!) 55  Resp: 16 18  Temp: 97.7 F (36.5 C) 98.3 F (36.8 C)  SpO2: 97% 97%   Vitals:   11/10/19 0839 11/10/19 1001  11/10/19 1012 11/10/19 1036  BP: 123/84 (!) 163/92 (!) 147/86 (!) 142/91  Pulse: 76 (!) 59 (!) 58 (!) 55  Resp: 12 18 16 18   Temp: 98.3 F (36.8 C) 97.9 F (36.6 C) 97.7 F (36.5 C) 98.3 F (36.8 C)  TempSrc: Oral Oral Oral Oral  SpO2: 98% 96% 97% 97%  Weight:      Height:        General: Pt is alert, awake, not in acute distress Cardiovascular: RRR, S1/S2 +, no rubs, no gallops Respiratory: CTA bilaterally, no wheezing, no rhonchi Abdominal: Soft, NT, ND, bowel sounds + Extremities: no edema, no cyanosis    The results of significant diagnostics from this hospitalization (including imaging, microbiology, ancillary and laboratory) are listed below for reference.     Microbiology: Recent Results (from the past 240 hour(s))  SARS CORONAVIRUS 2 (TAT 6-24 HRS) Nasopharyngeal Nasopharyngeal Swab     Status: None   Collection Time: 11/06/19  2:18 PM   Specimen: Nasopharyngeal Swab  Result Value Ref Range Status   SARS Coronavirus 2 NEGATIVE NEGATIVE Final    Comment: (NOTE) SARS-CoV-2 target nucleic acids are NOT DETECTED. The SARS-CoV-2 RNA is generally detectable in upper and lower respiratory specimens during the acute phase of infection. Negative results do not preclude SARS-CoV-2 infection, do not rule out co-infections with other pathogens, and should not be used as the sole basis for treatment or other patient management decisions. Negative results must be combined with clinical observations, patient history, and epidemiological information. The expected result is Negative. Fact Sheet for Patients: SugarRoll.be Fact Sheet for Healthcare Providers: https://www.woods-mathews.com/ This test is not yet approved or cleared by the [place] FDA and  has been authorized for detection and/or diagnosis of SARS-CoV-2 by FDA under an Emergency Use Authorization (EUA). This EUA will remain  in effect (meaning this test can be used)  for the duration of the COVID-19 declaration under Section 56 4(b)(1) of the Act, 21 U.S.C. section 360bbb-3(b)(1), unless the authorization is terminated or revoked sooner. Performed at [facility], [place] [address], [place], [place] [address]      Labs: BNP (last 3 results) No results for input(s): BNP in the last 8760 hours. Basic Metabolic Panel: Recent Labs  Lab 11/06/19 1126 11/06/19 1521 11/07/19 0429  NA 140  --  138  K 4.0  --  3.7  CL 102  --  102  CO2 28  --  27  GLUCOSE 101*  --  97  BUN 11  --  12  CREATININE 0.93  --  0.88  CALCIUM 9.4  --  9.0  MG  --  2.1  --   PHOS  --  4.2  --    Liver Function Tests: No results for input(s): AST, ALT, ALKPHOS, BILITOT, PROT, ALBUMIN in the last 168 hours. No results for input(s): LIPASE, AMYLASE in the last 168 hours. No results for input(s): AMMONIA in the  last 168 hours. CBC: Recent Labs  Lab 11/06/19 1126 11/07/19 0429  WBC 6.3 4.9  HGB 15.7 15.6  HCT 48.1 46.7  MCV 93.8 91.6  PLT 207 163   Cardiac Enzymes: No results for input(s): CKTOTAL, CKMB, CKMBINDEX, TROPONINI in the last 168 hours. BNP: Invalid input(s): POCBNP CBG: No results for input(s): GLUCAP in the last 168 hours. D-Dimer No results for input(s): DDIMER in the last 72 hours. Hgb A1c No results for input(s): HGBA1C in the last 72 hours. Lipid Profile No results for input(s): CHOL, HDL, LDLCALC, TRIG, CHOLHDL, LDLDIRECT in the last 72 hours. Thyroid function studies No results for input(s): TSH, T4TOTAL, T3FREE, THYROIDAB in the last 72 hours.  Invalid input(s): FREET3 Anemia work up No results for input(s): VITAMINB12, FOLATE, FERRITIN, TIBC, IRON, RETICCTPCT in the last 72 hours. Urinalysis    Component Value Date/Time   COLORURINE YELLOW 10/18/2013 1435   APPEARANCEUR CLEAR 10/18/2013 1435   LABSPEC 1.025 10/18/2013 1435   PHURINE 5.5 10/18/2013 1435   GLUCOSEU NEGATIVE 10/18/2013 1435   HGBUR NEGATIVE 10/18/2013 1435    BILIRUBINUR NEGATIVE 10/18/2013 1435   KETONESUR NEGATIVE 10/18/2013 1435   PROTEINUR NEGATIVE 10/18/2013 1435   UROBILINOGEN 0.2 10/18/2013 1435   NITRITE NEGATIVE 10/18/2013 1435   LEUKOCYTESUR TRACE (A) 10/18/2013 1435   Sepsis Labs Invalid input(s): PROCALCITONIN,  WBC,  LACTICIDVEN Microbiology Recent Results (from the past 240 hour(s))  SARS CORONAVIRUS 2 (TAT 6-24 HRS) Nasopharyngeal Nasopharyngeal Swab     Status: None   Collection Time: 11/06/19  2:18 PM   Specimen: Nasopharyngeal Swab  Result Value Ref Range Status   SARS Coronavirus 2 NEGATIVE NEGATIVE Final    Comment: (NOTE) SARS-CoV-2 target nucleic acids are NOT DETECTED. The SARS-CoV-2 RNA is generally detectable in upper and lower respiratory specimens during the acute phase of infection. Negative results do not preclude SARS-CoV-2 infection, do not rule out co-infections with other pathogens, and should not be used as the sole basis for treatment or other patient management decisions. Negative results must be combined with clinical observations, patient history, and epidemiological information. The expected result is Negative. Fact Sheet for Patients: SugarRoll.be Fact Sheet for Healthcare Providers: https://www.woods-mathews.com/ This test is not yet approved or cleared by the [place] FDA and  has been authorized for detection and/or diagnosis of SARS-CoV-2 by FDA under an Emergency Use Authorization (EUA). This EUA will remain  in effect (meaning this test can be used) for the duration of the COVID-19 declaration under Section 56 4(b)(1) of the Act, 21 U.S.C. section 360bbb-3(b)(1), unless the authorization is terminated or revoked sooner. Performed at [facility], [place] [address], [place], [place] [address]     Please note: You were cared for by a hospitalist during your hospital stay. Once you are discharged, your primary care physician will handle  any further medical issues. Please note that NO REFILLS for any discharge medications will be authorized once you are discharged, as it is imperative that you return to your primary care physician (or establish a relationship with a primary care physician if you do not have one) for your post hospital discharge needs so that they can reassess your need for medications and monitor your lab values.    Time coordinating discharge: 40 minutes  SIGNED:   Shelly Coss, MD  [facility] 11/10/2019, 11:33 AM Pager [phone]  If 7PM-7AM, please contact night-coverage [email or web address] Password [email or web address]

## 2019-11-10 NOTE — Progress Notes (Signed)
NIF>-40 VC- 1.7L  Patient performed with excellent effort. RT will continue to monitor.

## 2019-11-12 ENCOUNTER — Ambulatory Visit (INDEPENDENT_AMBULATORY_CARE_PROVIDER_SITE_OTHER): Payer: Medicare Other | Admitting: Internal Medicine

## 2019-11-12 ENCOUNTER — Other Ambulatory Visit: Payer: Self-pay

## 2019-11-12 ENCOUNTER — Encounter (INDEPENDENT_AMBULATORY_CARE_PROVIDER_SITE_OTHER): Payer: Self-pay | Admitting: Internal Medicine

## 2019-11-12 VITALS — BP 144/79 | HR 64 | Temp 97.3°F | Ht 73.0 in | Wt 205.5 lb

## 2019-11-12 DIAGNOSIS — K219 Gastro-esophageal reflux disease without esophagitis: Secondary | ICD-10-CM | POA: Diagnosis not present

## 2019-11-12 DIAGNOSIS — Z86010 Personal history of colonic polyps: Secondary | ICD-10-CM | POA: Diagnosis not present

## 2019-11-12 DIAGNOSIS — R197 Diarrhea, unspecified: Secondary | ICD-10-CM

## 2019-11-12 NOTE — Progress Notes (Signed)
Presenting complaint;  Follow-up of acute diarrhea.  Database and subjective:  Patient is 74 year old Caucasian male was seen in the office about 8 weeks ago with acute onset of diarrhea and intractable flatulence.  Symptoms started 1 week earlier.  Patient also reported single episode of melena and his stool was guaiac positive.  He was advised to stop meloxicam.  He was given prescription for hydrocodone/acetaminophen to be used on as-needed basis for joint pain. CBC with differential normal.  GI pathogen panel and C. difficile testing was also negative.  Patient was treated with Cipro and metronidazole for 1 week. He returned for follow-up visit on 10/08/2019 and he was feeling much better.  He did not have any more melena. He was also diagnosed with myasthenia gravis around the same time and begun on pyridostigmine by his neurologist.  1 week ago he developed breathing difficulty due to muscle weakness and was admitted to [facility] and treated with IVIG prednisone along with pyridostigmine.  He was discharged 2 days ago. He feels much better.  He is not having any GI issues.  He is having formed stools.  His bloating has resolved.  He has not had any more dark stools.  His heartburn is well controlled with therapy and he denies dysphagia.  He is accompanied by his wife [name] who was worried of him gaining weight while he is on prednisone.  Current Medications: Outpatient Encounter Medications as of 11/12/2019  Medication Sig  . acetaminophen (TYLENOL) 500 MG tablet Take 1,000 mg by mouth daily.   Marland Kitchen allopurinol (ZYLOPRIM) 300 MG tablet Take 300 mg by mouth daily.  Marland Kitchen amLODipine (NORVASC) 5 MG tablet Take 1 tablet (5 mg total) by mouth daily.  . Ascorbic Acid (VITAMIN C) 1000 MG tablet Take 1,000 mg by mouth daily.  Marland Kitchen aspirin EC 81 MG tablet Take 1 tablet (81 mg total) by mouth daily.  . famotidine (PEPCID) 20 MG tablet Take 1 tablet (20 mg total) by mouth at bedtime.  . Glucosamine-Chondroitin  (OSTEO BI-FLEX REGULAR STRENGTH PO) Take 1 tablet by mouth daily.  Marland Kitchen guaiFENesin (MUCINEX) 600 MG 12 hr tablet Take 600 mg by mouth 2 (two) times daily as needed for cough or to loosen phlegm.  . hydrochlorothiazide (HYDRODIURIL) 50 MG tablet Take 50 mg by mouth daily.  . Multiple Vitamin (MULITIVITAMIN WITH MINERALS) TABS Take 1 tablet by mouth daily.  . nitroGLYCERIN (NITROSTAT) 0.4 MG SL tablet Place 1 tablet (0.4 mg total) under the tongue every 5 (five) minutes as needed.  . Omega-3 Fatty Acids (FISH OIL) 500 MG CAPS Take 500 mg by mouth daily.   Marland Kitchen oxymetazoline (AFRIN) 0.05 % nasal spray Place 1 spray into both nostrils 2 (two) times daily as needed for congestion.  . pantoprazole (PROTONIX) 40 MG tablet Take 1 tablet (40 mg total) by mouth daily.  . potassium chloride (K-DUR,KLOR-CON) 10 MEQ tablet Take 10 mEq by mouth daily.  . pravastatin (PRAVACHOL) 40 MG tablet Take 40 mg by mouth every evening.   . predniSONE (DELTASONE) 20 MG tablet Take 3 tablets (60 mg total) by mouth daily.  Marland Kitchen pyridostigmine (MESTINON) 60 MG tablet Take 0.5-1 tablets (30-60 mg total) by mouth 3 (three) times daily. (Patient taking differently: Take 60 mg by mouth 3 (three) times daily. )  . tamsulosin (FLOMAX) 0.4 MG CAPS capsule Take 0.4 mg by mouth 2 (two) times daily.   . [DISCONTINUED] HYDROcodone-acetaminophen (NORCO/VICODIN) 5-325 MG tablet Take 1 tablet by mouth 3 (three) times daily as  needed for moderate pain. (Patient not taking: Reported on 11/12/2019)   No facility-administered encounter medications on file as of 11/12/2019.     Objective: Blood pressure (!) 144/79, pulse 64, temperature (!) 97.3 F (36.3 C), temperature source Temporal, height '6\' 1"'  (1.854 m), weight 205 lb 8 oz (93.2 kg). Patient is alert and in no acute distress. He is wearing a facial mask. He does not have droopy eyelids. Conjunctiva is pink. Sclera is nonicteric Oropharyngeal mucosa is normal. No neck masses or thyromegaly  noted. Cardiac exam with regular rhythm normal S1 and S2. No murmur or gallop noted. Lungs are clear to auscultation. Abdomen is full but soft and nontender with organomegaly no masses. No LE edema or clubbing noted.  Labs/studies Results:  CBC Latest Ref Rng & Units 11/07/2019 11/06/2019 09/17/2019  WBC 4.0 - 10.5 K/uL 4.9 6.3 6.1  Hemoglobin 13.0 - 17.0 g/dL 15.6 15.7 15.6  Hematocrit 39.0 - 52.0 % 46.7 48.1 46.1  Platelets 150 - 400 K/uL 163 207 210    CMP Latest Ref Rng & Units 11/07/2019 11/06/2019 10/15/2019  Glucose 70 - 99 mg/dL 97 101(H) -  BUN 8 - 23 mg/dL 12 11 -  Creatinine 0.61 - 1.24 mg/dL 0.88 0.93 0.80  Sodium 135 - 145 mmol/L 138 140 -  Potassium 3.5 - 5.1 mmol/L 3.7 4.0 -  Chloride 98 - 111 mmol/L 102 102 -  CO2 22 - 32 mmol/L 27 28 -  Calcium 8.9 - 10.3 mg/dL 9.0 9.4 -  Total Protein 6.0 - 8.3 g/dL - - -  Total Bilirubin 0.2 - 1.2 mg/dL - - -  Alkaline Phos 25 - 125 - - -  AST 14 - 40 - - -  ALT 10 - 40 - - -    Hepatic Function Latest Ref Rng & Units 06/28/2019 12/06/2013 10/18/2013  Total Protein 6.0 - 8.3 g/dL - 7.0 7.1  Albumin 3.5 - 5.0 4.4 4.4 4.0  AST 14 - 40 '21 23 29  ' ALT 10 - 40 '19 22 24  ' Alk Phosphatase 25 - 125 70 74 68  Total Bilirubin 0.2 - 1.2 mg/dL - 0.7 0.5  Bilirubin, Direct 0.0 - 0.3 mg/dL - 0.2 -     Assessment:  #1.  Acute onset of diarrhea associated with intractable flatulence.  Stool studies were negative.  He has responded to short course of ciprofloxacin and metronidazole.  I do not believe his diarrhea has anything to do with new diagnosis of myasthenia gravis or vice versa.  #2.  Chronic GERD.  He is doing well with therapy.  #3.  History of colonic adenomas.  He is due for colonoscopy later this month.  It can be postponed depending on how he is as far as his myasthenia gravis is concerned.  #4.  Patient with new diagnosis of myasthenia gravis.  He is not having dysphagia.  Patient patient is aware of this possibility.  If he has any  difficulty he will let us know.  Patient reminded to chew food thoroughly and eat slowly.   Plan:  Patient can start using famotidine OTC 20 mg daily as needed when he comes off prednisone or not low-dose. He will continue pantoprazole as before. Surveillance colonoscopy later this year. Office visit in 1 year.

## 2019-11-12 NOTE — Patient Instructions (Signed)
Can start using Pepcid/famotidine on as-needed basis when off prednisone or on a maintenance dose. Colonoscopy to be scheduled.

## 2019-11-13 ENCOUNTER — Ambulatory Visit: Payer: Medicare Other | Admitting: Diagnostic Neuroimaging

## 2019-11-14 ENCOUNTER — Other Ambulatory Visit: Payer: Self-pay

## 2019-11-14 ENCOUNTER — Encounter: Payer: Self-pay | Admitting: Diagnostic Neuroimaging

## 2019-11-14 ENCOUNTER — Ambulatory Visit (INDEPENDENT_AMBULATORY_CARE_PROVIDER_SITE_OTHER): Payer: Medicare Other | Admitting: Diagnostic Neuroimaging

## 2019-11-14 VITALS — BP 141/84 | HR 62 | Temp 97.9°F | Ht 73.0 in | Wt 206.0 lb

## 2019-11-14 DIAGNOSIS — G7000 Myasthenia gravis without (acute) exacerbation: Secondary | ICD-10-CM

## 2019-11-14 MED ORDER — PREDNISONE 20 MG PO TABS: 60.0000 mg | ORAL_TABLET | Freq: Every day | ORAL | 12 refills | Status: DC

## 2019-11-14 NOTE — Progress Notes (Signed)
[facility]  [name] DOB: March 10, 1946  REFERRING CLINICIAN: Sharilyn Sites, MD HISTORY FROM: patient and wife  REASON FOR VISIT: follow up    HISTORICAL  CHIEF COMPLAINT:  Chief Complaint  Patient presents with  . Myasthenia Gravis    rm 7 hospital FU wife- Brandon [name]  "daily improvement with IVIG; I am feeling much better"    HISTORY OF PRESENT ILLNESS:   UPDATE (11/14/19, VRP): Since last visit, doing WELL; s/p IVIG in the hospital. Now on prednisone 60mg  daily. Symptoms are improved. No alleviating or aggravating factors. Tolerating meds.    UPDATE (10/31/19, VRP): Since last visit, doing poorly. Now with fatigue, SOB, hand weakness, leg weakness. Mestinon not helping (even up to 90mg  three times a day).  PRIOR HPI (09/24/19): 74 year old male here for evaluation of myasthenia gravis.  September 10, 2019 patient noticed right eye ptosis, progressing to left eye ptosis.  He was having some stomach pain, diarrhea, gas, and breathing issues around the same time.  [name], had [place] R antibody testing which was positive and diagnosed with myasthenia gravis.  Around the same time patient was with a GI doctor, diagnosed with possible bacterial infection and treated with antibiotics.  He was also noted to be heme positive stool.  Since that time I symptoms are stable.  Symptoms are worse in the evening compared the morning.  [name].  Denies any shortness of breath.  Denies any speech or swallowing problems.  Eye drooping is severe enough that sometimes it obstructs his vision and he is not able to drive currently.   REVIEW OF SYSTEMS: Full 14 system review of systems performed and negative with exception of: as per HPI.    ALLERGIES: No Known Allergies  HOME MEDICATIONS: Outpatient Medications Prior to Visit  Medication Sig Dispense Refill  . acetaminophen (TYLENOL) 500 MG tablet Take 1,000 mg by  mouth daily.     Marland Kitchen allopurinol (ZYLOPRIM) 300 MG tablet Take 300 mg by mouth daily.    Marland Kitchen amLODipine (NORVASC) 5 MG tablet Take 1 tablet (5 mg total) by mouth daily. 30 tablet 1  . Ascorbic Acid (VITAMIN C) 1000 MG tablet Take 1,000 mg by mouth daily.    Marland Kitchen aspirin EC 81 MG tablet Take 1 tablet (81 mg total) by mouth daily. 90 tablet 3  . famotidine (PEPCID) 20 MG tablet Take 1 tablet (20 mg total) by mouth at bedtime.    . Glucosamine-Chondroitin (OSTEO BI-FLEX REGULAR STRENGTH PO) Take 1 tablet by mouth daily.    Marland Kitchen guaiFENesin (MUCINEX) 600 MG 12 hr tablet Take 600 mg by mouth 2 (two) times daily as needed for cough or to loosen phlegm.    . hydrochlorothiazide (HYDRODIURIL) 50 MG tablet Take 50 mg by mouth daily.    . Multiple Vitamin (MULITIVITAMIN WITH MINERALS) TABS Take 1 tablet by mouth daily.    . nitroGLYCERIN (NITROSTAT) 0.4 MG SL tablet Place 1 tablet (0.4 mg total) under the tongue every 5 (five) minutes as needed. 25 tablet 3  . Omega-3 Fatty Acids (FISH OIL) 500 MG CAPS Take 500 mg by mouth daily.     Marland Kitchen oxymetazoline (AFRIN) 0.05 % nasal spray Place 1 spray into both nostrils 2 (two) times daily as needed for congestion.    . pantoprazole (PROTONIX) 40 MG tablet Take 1 tablet (40 mg total) by mouth daily. 30 tablet 1  . potassium chloride (K-DUR,KLOR-CON) 10 MEQ  tablet Take 10 mEq by mouth daily.    . pravastatin (PRAVACHOL) 40 MG tablet Take 40 mg by mouth every evening.     . predniSONE (DELTASONE) 20 MG tablet Take 3 tablets (60 mg total) by mouth daily. 90 tablet 0  . pyridostigmine (MESTINON) 60 MG tablet Take 0.5-1 tablets (30-60 mg total) by mouth 3 (three) times daily. (Patient taking differently: Take 60 mg by mouth 3 (three) times daily. ) 90 tablet 6  . tamsulosin (FLOMAX) 0.4 MG CAPS capsule Take 0.4 mg by mouth 2 (two) times daily.     . meloxicam (MOBIC) 7.5 MG tablet Take 7.5 mg by mouth daily.     No facility-administered medications prior to visit.    PAST  MEDICAL HISTORY: Past Medical History:  Diagnosis Date  . Arthritis   . Atherosclerotic heart disease of native coronary artery without angina pectoris   . Cataract   . Coronary atherosclerosis of native coronary artery    Multivessel status post CABG March 2015  . DDD (degenerative disc disease), lumbar    Dr. Carloyn [name]  . Essential hypertension, benign   . GERD (gastroesophageal reflux disease)   . Hard of hearing   . Hemorrhoids   . History of gout   . History of kidney stones   . History of [facility] spotted fever   . Hyperlipidemia, unspecified   . Hypertensive heart disease without heart failure   . Kidney stones   . Lumbago   . Lumbago   . Melena   . Mixed hyperlipidemia   . Ocular myasthenia ([place])   . Personal history of other diseases of the musculoskeletal system and connective tissue   . Plantar fascial fibromatosis   . Pleurodynia   . Renal calculus or stone 05/22/14  . Unspecified abdominal pain   . Unspecified osteoarthritis, unspecified site     PAST SURGICAL HISTORY: Past Surgical History:  Procedure Laterality Date  . AMPUTATION Right 03/27/2014   Procedure: DEBRIDEMENT AND CLOSURE RIGHT INDEX FINGER;  Surgeon: [name], MD;  Location: [place];  Service: Orthopedics;  Laterality: Right;  . BACK SURGERY    . BICEPT TENODESIS  11/26/2011   Procedure: BICEPT TENODESIS;  Surgeon: Brandon Schooling, MD;  Location: [place];  Service: Orthopedics;  Laterality: Left;  . BIOPSY  08/03/2018   Procedure: BIOPSY;  Surgeon: Brandon Houston, MD;  [facility];  Service: Endoscopy;;  antral  . Cataract surgery Left   . CHOLECYSTECTOMY N/A 09/22/2018   Procedure: LAPAROSCOPIC CHOLECYSTECTOMY;  Surgeon: Brandon Cagey, MD;  Location: [facility];  Service: General;  Laterality: N/A;  . COLONOSCOPY    . COLONOSCOPY N/A 12/26/2014   Procedure: COLONOSCOPY;  Surgeon: Brandon Houston, MD;  [facility];  Service: Endoscopy;  Laterality: N/A;  730  .  CORONARY ARTERY BYPASS GRAFT N/A 10/19/2013   Procedure: CORONARY ARTERY BYPASS GRAFTING (CABG);  Surgeon: Brandon Alberts, MD;  Location: [place];  Service: Open Heart Surgery;  Laterality: N/A;  CABG times four using left internal mammary artery and left leg saphenous vein, incision made on right leg but no vein removed  . CYSTOSCOPY WITH RETROGRADE PYELOGRAM, URETEROSCOPY AND STENT PLACEMENT Left 08/24/2016   Procedure: CYSTOSCOPY WITH LEFT RETROGRADE PYELOGRAM, LEFT URETEROSCOPY, BASKET EXTRACTION LEFT URETERAL STONE;  Surgeon: Irine Seal, MD;  Location: [facility];  Service: Urology;  Laterality: Left;  . ESOPHAGOGASTRODUODENOSCOPY N/A 08/03/2018   Procedure: ESOPHAGOGASTRODUODENOSCOPY (EGD);  Surgeon: Brandon Houston, MD;  [facility];  Service: Endoscopy;  Laterality: N/A;  2:55  . EXTRACORPOREAL SHOCK WAVE LITHOTRIPSY Right 06/18/2019   Procedure: EXTRACORPOREAL SHOCK WAVE LITHOTRIPSY (ESWL);  Surgeon: Lucas Mallow, MD;  Location: [facility];  Service: Urology;  Laterality: Right;  . INTRAOPERATIVE TRANSESOPHAGEAL ECHOCARDIOGRAM N/A 10/19/2013   Procedure: INTRAOPERATIVE TRANSESOPHAGEAL ECHOCARDIOGRAM;  Surgeon: Brandon Alberts, MD;  Location: [place];  Service: Open Heart Surgery;  Laterality: N/A;  . JOINT REPLACEMENT    . LEFT HEART CATHETERIZATION WITH CORONARY ANGIOGRAM N/A 10/09/2013   Procedure: LEFT HEART CATHETERIZATION WITH CORONARY ANGIOGRAM;  Surgeon: Burnell Blanks, MD;  Location: [facility];  Service: Cardiovascular;  Laterality: N/A;  . Left shoulder rotator cuff repair    . LITHOTRIPSY    . TOTAL KNEE ARTHROPLASTY Left   . traumaticvamputation of finger  2015  . VASECTOMY      FAMILY HISTORY: Family History  Problem Relation Age of Onset  . Hypertension Sister   . Lung cancer Father   . Heart failure Mother     SOCIAL HISTORY: Social History   Socioeconomic History  . Marital status: Married    Spouse name: Brandon [name]  . Number of children: 2  . Years of  education: Not on file  . Highest education level: 10th grade  Occupational History  . Occupation: lumber mills  Tobacco Use  . Smoking status: Former Smoker    Packs/day: 2.00    Years: 30.00    Pack years: 60.00    Types: Cigarettes    Start date: 08/16/1958    Quit date: 08/17/1983    Years since quitting: 36.2  . Smokeless tobacco: Former Systems developer    Types: Chew  . Tobacco comment: quit smoking 35yrs ago  Substance and Sexual Activity  . Alcohol use: Yes    Alcohol/week: 0.0 standard drinks    Comment: Occassional  . Drug use: No  . Sexual activity: Yes  Other Topics Concern  . Not on file  Social History Narrative   Lives with wife   Caffeine- coffee 3- 4 c daily   Social Determinants of Health   Financial Resource Strain:   . Difficulty of Paying Living Expenses:   Food Insecurity:   . Worried About Charity fundraiser in the Last Year:   . Arboriculturist in the Last Year:   Transportation Needs:   . Film/video editor (Medical):   Marland Kitchen Lack of Transportation (Non-Medical):   Physical Activity:   . Days of Exercise per Week:   . Minutes of Exercise per Session:   Stress:   . Feeling of Stress :   Social Connections:   . Frequency of Communication with Friends and Family:   . Frequency of Social Gatherings with Friends and Family:   . Attends Religious Services:   . Active Member of Clubs or Organizations:   . Attends Archivist Meetings:   Marland Kitchen Marital Status:   Intimate Partner Violence:   . Fear of Current or Ex-Partner:   . Emotionally Abused:   Marland Kitchen Physically Abused:   . Sexually Abused:      PHYSICAL EXAM  GENERAL EXAM/CONSTITUTIONAL: Vitals:  Vitals:   11/14/19 1242  BP: (!) 141/84  Pulse: 62  Temp: 97.9 F (36.6 C)  Weight: 206 lb (93.4 kg)  Height: 6\' 1"  (1.854 m)   Body mass index is 27.18 kg/m. Wt Readings from Last 3 Encounters:  11/14/19 206 lb (93.4 kg)  11/12/19 205  lb 8 oz (93.2 kg)  11/08/19 202 lb (91.6 kg)     Patient is in no distress; well developed, nourished and groomed; neck is supple  CARDIOVASCULAR:  Examination of carotid arteries is normal; no carotid bruits  Regular rate and rhythm, no murmurs  Examination of peripheral vascular system by observation and palpation is normal  EYES:  Ophthalmoscopic exam of optic discs and posterior segments is normal; no papilledema or hemorrhages No exam data present  MUSCULOSKELETAL:  Gait, strength, tone, movements noted in Neurologic exam below  NEUROLOGIC: MENTAL STATUS:  No flowsheet data found.  awake, alert, oriented to person, place and time  recent and remote memory intact  normal attention and concentration  language fluent, comprehension intact, naming intact  fund of knowledge appropriate  CRANIAL NERVE:   2nd - no papilledema on fundoscopic exam  2nd, 3rd, 4th, 6th - pupils equal and reactive to light, visual fields full to confrontation, extraocular muscles intact, no nystagmus; RIGHT PTOSIS   5th - facial sensation symmetric  7th - facial strength symmetric  8th - hearing --> REDUCED  9th - palate elevates symmetrically, uvula midline  11th - shoulder shrug symmetric  12th - tongue protrusion midline  MILD DYSARTHRIA   MOTOR:   normal bulk and tone  BUE 5 BLE 4 PROX; 5 DISTAL  SENSORY:   normal and symmetric to light touch, temperature, vibration  COORDINATION:   finger-nose-finger, fine finger movements normal  REFLEXES:   deep tendon reflexes TRACE and symmetric  GAIT/STATION:   narrow based gait     DIAGNOSTIC DATA (LABS, IMAGING, TESTING) - I reviewed patient records, labs, notes, testing and imaging myself where available.  Lab Results  Component Value Date   WBC 4.9 11/07/2019   HGB 15.6 11/07/2019   HCT 46.7 11/07/2019   MCV 91.6 11/07/2019   PLT 163 11/07/2019      Component Value Date/Time   NA 138 11/07/2019 0429   NA 138 06/28/2019 0000   K 3.7  11/07/2019 0429   CL 102 11/07/2019 0429   CO2 27 11/07/2019 0429   GLUCOSE 97 11/07/2019 0429   BUN 12 11/07/2019 0429   BUN 20 06/28/2019 0000   CREATININE 0.88 11/07/2019 0429   CALCIUM 9.0 11/07/2019 0429   PROT 7.0 12/06/2013 0757   ALBUMIN 4.4 06/28/2019 0000   AST 21 06/28/2019 0000   ALT 19 06/28/2019 0000   ALKPHOS 70 06/28/2019 0000   BILITOT 0.7 12/06/2013 0757   GFRNONAA >60 11/07/2019 0429   GFRAA >60 11/07/2019 0429   Lab Results  Component Value Date   CHOL 134 06/28/2019   HDL 49 06/28/2019   LDLCALC 62 06/28/2019   TRIG 157 06/28/2019   CHOLHDL 2.4 12/06/2013   Lab Results  Component Value Date   HGBA1C 5.9 (H) 10/18/2013   No results found for: VITAMINB12 No results found for: TSH  09/17/19 AchR ab - 42 HIGH  10/15/19 CT chest 1. No thymic mass or thymoma. 2.  Aortic Atherosclerosis (ICD10-I70.0). 3. 4.5 mm left lower lobe pulmonary nodule. This is unchanged compared with 04/12/2018. No follow-up needed if patient is low-risk. Non-contrast chest CT can be considered in 12 months if patient is high-risk. This recommendation follows the consensus statement: Guidelines for Management of Incidental Pulmonary Nodules Detected on CT Images: From the Fleischner Society 2017; Radiology 2017; 284:228-243.   ASSESSMENT AND PLAN  74 y.o. year old male here with new onset ptosis, blurred vision, generalized weakness with positive ACH  antibody, consistent with generalized myasthenia gravis.    Dx:  1. Myasthenia gravis without (acute) exacerbation (HCC)     PLAN:  GENERALIZED MYASTHENIA GRAVIS (exacerbation) - continue pyridostigmine 60mg  three times a day - completed IVIG x 5 days (in hospital; March 2021) - continue prednisone 60mg  daily x at least 1 month; then may start slow taper; if not tolerated, then may need to start imuran or cellcept  Meds ordered this encounter  Medications  . predniSONE (DELTASONE) 20 MG tablet    Sig: Take 3 tablets (60  mg total) by mouth daily.    Dispense:  90 tablet    Refill:  12    COVID VACCINE EDUCATION - ok to proceed with COVID vaccine in next 1-2 weeks  Return in about 3 months (around 02/13/2020).    Penni Bombard, MD [id number], [id number] PM Certified in Neurology, Neurophysiology and Neuroimaging  [facility] [address] [place], [place] [address] [phone]

## 2019-11-14 NOTE — Patient Instructions (Signed)
GENERALIZED MYASTHENIA GRAVIS - continue pyridostigmine 60mg  three times a day - continue prednisone 60mg  daily x at least 1 month; then may start slow taper; if not tolerated, then may need to start imuran or cellcept  Meds ordered this encounter  Medications  . predniSONE (DELTASONE) 20 MG tablet    Sig: Take 3 tablets (60 mg total) by mouth daily.    Dispense:  90 tablet    Refill:  12    COVID VACCINE EDUCATION - ok to proceed with COVID vaccine as soon as IVIG treatments are completed  Return in about 3 months (around 02/13/2020).

## 2019-11-19 DIAGNOSIS — I2510 Atherosclerotic heart disease of native coronary artery without angina pectoris: Secondary | ICD-10-CM | POA: Diagnosis not present

## 2019-11-19 DIAGNOSIS — J302 Other seasonal allergic rhinitis: Secondary | ICD-10-CM | POA: Diagnosis not present

## 2019-11-19 DIAGNOSIS — G7000 Myasthenia gravis without (acute) exacerbation: Secondary | ICD-10-CM | POA: Diagnosis not present

## 2019-11-19 DIAGNOSIS — Z6829 Body mass index (BMI) 29.0-29.9, adult: Secondary | ICD-10-CM | POA: Diagnosis not present

## 2019-11-22 LAB — TPMT GENETIC TEST

## 2019-11-26 ENCOUNTER — Telehealth: Payer: Self-pay | Admitting: Diagnostic Neuroimaging

## 2019-11-26 NOTE — Telephone Encounter (Signed)
Pt's wife called wanting to know that since the pt is doing much better is it ok for him to receive the Covid Vaccination. Please advise.

## 2019-11-26 NOTE — Telephone Encounter (Signed)
Yes ok to go ahead with covid vaccine. -VRP

## 2019-11-27 ENCOUNTER — Telehealth: Payer: Self-pay | Admitting: *Deleted

## 2019-11-27 NOTE — Telephone Encounter (Signed)
LVM informing patient Dr Brandon [name] stated he may get Covid vaccine. Left # for questions.

## 2019-11-29 ENCOUNTER — Ambulatory Visit: Payer: Medicare Other | Attending: Internal Medicine

## 2019-11-29 DIAGNOSIS — Z23 Encounter for immunization: Secondary | ICD-10-CM

## 2019-11-29 NOTE — Progress Notes (Signed)
   Covid-19 Vaccination Clinic  Name:  Brandon [name]    MRN: [id number] DOB: 09-Jan-1946  11/29/2019  [name] was observed post Covid-19 immunization for 15 minutes without incident. He was provided with Vaccine Information Sheet and instruction to access the V-Safe system.   [name] was instructed to call 911 with any severe reactions post vaccine: Marland Kitchen Difficulty breathing  . Swelling of face and throat  . A fast heartbeat  . A bad rash all over body  . Dizziness and weakness   Immunizations Administered    Name Date Dose VIS Date Route   Moderna COVID-19 Vaccine 11/29/2019  8:16 AM 0.5 mL 07/17/2019 Intramuscular   Manufacturer: Moderna   Lot: [id number]   [id number]

## 2019-12-03 NOTE — Telephone Encounter (Signed)
Normal TPMT activity. Ok to use imuran in future if we need to. -VRP

## 2019-12-11 ENCOUNTER — Ambulatory Visit: Payer: Medicare Other | Admitting: Diagnostic Neuroimaging

## 2019-12-12 ENCOUNTER — Telehealth: Payer: Self-pay | Admitting: Diagnostic Neuroimaging

## 2019-12-12 ENCOUNTER — Encounter (INDEPENDENT_AMBULATORY_CARE_PROVIDER_SITE_OTHER): Payer: Self-pay | Admitting: *Deleted

## 2019-12-12 NOTE — Telephone Encounter (Signed)
LVM for wife requesting call back with update.

## 2019-12-12 NOTE — Telephone Encounter (Signed)
Phone rep checked office voicemail's, at 10:41 [place]unanan,[name](wife on DPR) left message stating she was told by Dr [name] to call and give an update on how pt is doing.  [place], [place] is asking for a call at [phone] to discuss pt.

## 2019-12-12 NOTE — Telephone Encounter (Addendum)
Wife called with update and stated patient's right eye is the same, halfway closed. She tapes it open in the day so he can see well. He has gone back to work at his [employer]. Arms, shoulders, legs are still weak, but she stated he's 100 % better but not perfect. His breathing is good, grips are good.  She stated he's lost weight but unsure how much. She only requests a call back if Dr [name] is going to adjust his medications. I advised will let Dr [name] know of patient's progress. [name]  verbalized understanding, appreciation.

## 2019-12-18 DIAGNOSIS — Z6829 Body mass index (BMI) 29.0-29.9, adult: Secondary | ICD-10-CM | POA: Diagnosis not present

## 2019-12-18 DIAGNOSIS — E663 Overweight: Secondary | ICD-10-CM | POA: Diagnosis not present

## 2019-12-18 DIAGNOSIS — E7849 Other hyperlipidemia: Secondary | ICD-10-CM | POA: Diagnosis not present

## 2019-12-25 ENCOUNTER — Ambulatory Visit: Payer: Medicare Other | Admitting: Diagnostic Neuroimaging

## 2019-12-27 ENCOUNTER — Other Ambulatory Visit (INDEPENDENT_AMBULATORY_CARE_PROVIDER_SITE_OTHER): Payer: Self-pay | Admitting: *Deleted

## 2019-12-28 ENCOUNTER — Other Ambulatory Visit (INDEPENDENT_AMBULATORY_CARE_PROVIDER_SITE_OTHER): Payer: Self-pay | Admitting: *Deleted

## 2019-12-28 DIAGNOSIS — Z86010 Personal history of colonic polyps: Secondary | ICD-10-CM

## 2019-12-31 ENCOUNTER — Ambulatory Visit: Payer: Medicare Other | Admitting: Diagnostic Neuroimaging

## 2019-12-31 DIAGNOSIS — M160 Bilateral primary osteoarthritis of hip: Secondary | ICD-10-CM | POA: Diagnosis not present

## 2019-12-31 DIAGNOSIS — M25552 Pain in left hip: Secondary | ICD-10-CM | POA: Diagnosis not present

## 2020-01-02 ENCOUNTER — Other Ambulatory Visit: Payer: Self-pay

## 2020-01-02 ENCOUNTER — Telehealth (INDEPENDENT_AMBULATORY_CARE_PROVIDER_SITE_OTHER): Payer: Self-pay | Admitting: *Deleted

## 2020-01-02 ENCOUNTER — Ambulatory Visit (INDEPENDENT_AMBULATORY_CARE_PROVIDER_SITE_OTHER): Payer: Self-pay

## 2020-01-02 ENCOUNTER — Encounter (INDEPENDENT_AMBULATORY_CARE_PROVIDER_SITE_OTHER): Payer: Self-pay | Admitting: *Deleted

## 2020-01-02 NOTE — Telephone Encounter (Signed)
Referring MD/PCP: goldin   Procedure: tcs  Reason/Indication:  Hx polyps  Has patient had this procedure before?  Yes, 2016  If so, when, by whom and where?    Is there a family history of colon cancer?  no  Who?  What age when diagnosed?    Is patient diabetic?   no      Does patient have prosthetic heart valve or mechanical valve?  no  Do you have a pacemaker/defibrillator?  no  Has patient ever had endocarditis/atrial fibrillation? no  Does patient use oxygen? no  Has patient had joint replacement within last 12 months?  no  Is patient constipated or do they take laxatives? no  Does patient have a history of alcohol/drug use?  no  Is patient on blood thinner such as Coumadin, Plavix and/or Aspirin? yes  Medications: asa 81 mg daily, tylenol 500 mg bid, allopurinol 300 mg daily, amlodipine 5 mg daily,m famotidine 20 mg daily, fish oil 500 mg mg daily, mucinex daily, hctz 50 mg daily, mvi daily, nitroglycerin prn, osteo bio flex daily, afrin nasal spray, pantoprazole 40 mg daily, potassium 10 meq daily, pravastatin 40 mg daily, prednisone 20 mg tid, tamsulosin 0.4 mg bid, vit c daily  Allergies: nkda  Medication Adjustment per Dr Laural Golden: asa 2 days  Procedure date & time: 01/24/20

## 2020-01-04 ENCOUNTER — Telehealth (INDEPENDENT_AMBULATORY_CARE_PROVIDER_SITE_OTHER): Payer: Self-pay | Admitting: *Deleted

## 2020-01-04 DIAGNOSIS — M25552 Pain in left hip: Secondary | ICD-10-CM | POA: Diagnosis not present

## 2020-01-04 NOTE — Telephone Encounter (Signed)
Patient needs Plenvu (copay card) ° °

## 2020-01-04 NOTE — Telephone Encounter (Signed)
Ok to schedule.

## 2020-01-07 ENCOUNTER — Ambulatory Visit: Payer: Medicare Other | Admitting: Diagnostic Neuroimaging

## 2020-01-07 MED ORDER — PLENVU 140 G PO SOLR: 1.0000 | Freq: Once | ORAL | 0 refills | Status: AC

## 2020-01-08 ENCOUNTER — Ambulatory Visit (INDEPENDENT_AMBULATORY_CARE_PROVIDER_SITE_OTHER): Payer: Medicare Other | Admitting: Diagnostic Neuroimaging

## 2020-01-08 ENCOUNTER — Ambulatory Visit: Payer: Medicare Other

## 2020-01-08 ENCOUNTER — Telehealth (INDEPENDENT_AMBULATORY_CARE_PROVIDER_SITE_OTHER): Payer: Self-pay | Admitting: *Deleted

## 2020-01-08 ENCOUNTER — Encounter: Payer: Self-pay | Admitting: Diagnostic Neuroimaging

## 2020-01-08 ENCOUNTER — Ambulatory Visit: Payer: Medicare Other | Attending: Internal Medicine

## 2020-01-08 ENCOUNTER — Other Ambulatory Visit: Payer: Self-pay

## 2020-01-08 VITALS — BP 154/83 | HR 76 | Wt 210.4 lb

## 2020-01-08 DIAGNOSIS — Z23 Encounter for immunization: Secondary | ICD-10-CM

## 2020-01-08 DIAGNOSIS — G7000 Myasthenia gravis without (acute) exacerbation: Secondary | ICD-10-CM | POA: Diagnosis not present

## 2020-01-08 MED ORDER — PYRIDOSTIGMINE BROMIDE 60 MG PO TABS: 60.0000 mg | ORAL_TABLET | Freq: Three times a day (TID) | ORAL | 12 refills | Status: DC

## 2020-01-08 MED ORDER — PREDNISONE 20 MG PO TABS: 60.0000 mg | ORAL_TABLET | Freq: Every day | ORAL | 12 refills | Status: DC

## 2020-01-08 NOTE — Telephone Encounter (Signed)
Patient called -- he is sch'd for TCS - had MRI 01/04/20 that showed a stress fracture on his left hip - he is on a walker to keep weight off and goes back for f/u in 3 weeks - should they post pone procedure - please advise

## 2020-01-08 NOTE — Progress Notes (Signed)
[facility]  [name] DOB: 14-Apr-1946  REFERRING CLINICIAN: Sharilyn Sites, MD HISTORY FROM: patient and wife  REASON FOR VISIT: follow up    HISTORICAL  CHIEF COMPLAINT:  Chief Complaint  Patient presents with  . Ocular myasthenia    rm 7 wife- [name] "doing well, my right eye has taken longer to improve, my breathing is good, muscle are doing alright"    HISTORY OF PRESENT ILLNESS:   UPDATE (01/08/20, VRP): Since last visit, doing WELL. Symptoms are stable. No alleviating or aggravating factors. Tolerating meds. New left hip stress fracture since Dec 28, 2019. Using walker.   UPDATE (11/14/19, VRP): Since last visit, doing WELL; s/p IVIG in the hospital. Now on prednisone 60mg  daily. Symptoms are improved. No alleviating or aggravating factors. Tolerating meds.    UPDATE (10/31/19, VRP): Since last visit, doing poorly. Now with fatigue, SOB, hand weakness, leg weakness. Mestinon not helping (even up to 90mg  three times a day).  PRIOR HPI (09/24/19): 74 year old male here for evaluation of myasthenia gravis.  September 10, 2019 patient noticed right eye ptosis, progressing to left eye ptosis.  He was having some stomach pain, diarrhea, gas, and breathing issues around the same time.  [name], had [place] R antibody testing which was positive and diagnosed with myasthenia gravis.  Around the same time patient was with a GI doctor, diagnosed with possible bacterial infection and treated with antibiotics.  He was also noted to be heme positive stool.  Since that time I symptoms are stable.  Symptoms are worse in the evening compared the morning.  [name].  Denies any shortness of breath.  Denies any speech or swallowing problems.  Eye drooping is severe enough that sometimes it obstructs his vision and he is not able to drive currently.   REVIEW OF SYSTEMS: Full 14 system review of systems performed and  negative with exception of: as per HPI.    ALLERGIES: No Known Allergies  HOME MEDICATIONS: Outpatient Medications Prior to Visit  Medication Sig Dispense Refill  . Acetaminophen (TYLENOL ARTHRITIS PAIN PO) Take by mouth.    Marland Kitchen acetaminophen (TYLENOL) 500 MG tablet Take 1,000 mg by mouth daily.     Marland Kitchen allopurinol (ZYLOPRIM) 300 MG tablet Take 300 mg by mouth daily.    Marland Kitchen amLODipine (NORVASC) 5 MG tablet Take 1 tablet (5 mg total) by mouth daily. 30 tablet 1  . Ascorbic Acid (VITAMIN C) 1000 MG tablet Take 1,000 mg by mouth daily.    Marland Kitchen aspirin EC 81 MG tablet Take 1 tablet (81 mg total) by mouth daily. 90 tablet 3  . famotidine (PEPCID) 20 MG tablet Take 1 tablet (20 mg total) by mouth at bedtime.    . Glucosamine-Chondroitin (OSTEO BI-FLEX REGULAR STRENGTH PO) Take 1 tablet by mouth daily.    Marland Kitchen guaiFENesin (MUCINEX) 600 MG 12 hr tablet Take 600 mg by mouth 2 (two) times daily as needed for cough or to loosen phlegm.    . hydrochlorothiazide (HYDRODIURIL) 50 MG tablet Take 50 mg by mouth daily.    . methocarbamol (ROBAXIN) 500 MG tablet Take by mouth.    . Multiple Vitamin (MULITIVITAMIN WITH MINERALS) TABS Take 1 tablet by mouth daily.    . nitroGLYCERIN (NITROSTAT) 0.4 MG SL tablet Place 1 tablet (0.4 mg total) under the tongue every 5 (five) minutes as needed. 25 tablet 3  . Omega-3 Fatty Acids (FISH OIL) 500  MG CAPS Take 500 mg by mouth daily.     Marland Kitchen oxymetazoline (AFRIN) 0.05 % nasal spray Place 1 spray into both nostrils 2 (two) times daily as needed for congestion.    . pantoprazole (PROTONIX) 40 MG tablet Take 1 tablet (40 mg total) by mouth daily. 30 tablet 1  . potassium chloride (K-DUR,KLOR-CON) 10 MEQ tablet Take 10 mEq by mouth daily.    . pravastatin (PRAVACHOL) 40 MG tablet Take 40 mg by mouth every evening.     . predniSONE (DELTASONE) 20 MG tablet Take 3 tablets (60 mg total) by mouth daily. 90 tablet 12  . pyridostigmine (MESTINON) 60 MG tablet Take 0.5-1 tablets (30-60 mg  total) by mouth 3 (three) times daily. (Patient taking differently: Take 60 mg by mouth 3 (three) times daily. ) 90 tablet 6  . tamsulosin (FLOMAX) 0.4 MG CAPS capsule Take 0.4 mg by mouth 2 (two) times daily.      No facility-administered medications prior to visit.    PAST MEDICAL HISTORY: Past Medical History:  Diagnosis Date  . Arthritis   . Atherosclerotic heart disease of native coronary artery without angina pectoris   . Cataract   . Coronary atherosclerosis of native coronary artery    Multivessel status post CABG March 2015  . DDD (degenerative disc disease), lumbar    Dr. Carloyn Manner  . Essential hypertension, benign   . GERD (gastroesophageal reflux disease)   . Hard of hearing   . Hemorrhoids   . History of gout   . History of kidney stones   . History of [facility] spotted fever   . Hyperlipidemia, unspecified   . Hypertensive heart disease without heart failure   . Kidney stones   . Lumbago   . Lumbago   . Melena   . Mixed hyperlipidemia   . Ocular myasthenia ([place])   . Personal history of other diseases of the musculoskeletal system and connective tissue   . Plantar fascial fibromatosis   . Pleurodynia   . Renal calculus or stone 05/22/14  . Stress fracture 2021   left hip  . Unspecified abdominal pain   . Unspecified osteoarthritis, unspecified site     PAST SURGICAL HISTORY: Past Surgical History:  Procedure Laterality Date  . AMPUTATION Right 03/27/2014   Procedure: DEBRIDEMENT AND CLOSURE RIGHT INDEX FINGER;  Surgeon: [name], MD;  Location: [place];  Service: Orthopedics;  Laterality: Right;  . BACK SURGERY    . BICEPT TENODESIS  11/26/2011   Procedure: BICEPT TENODESIS;  Surgeon: Augustin Schooling, MD;  Location: [place];  Service: Orthopedics;  Laterality: Left;  . BIOPSY  08/03/2018   Procedure: BIOPSY;  Surgeon: Rogene Houston, MD;  [facility];  Service: Endoscopy;;  antral  . Cataract surgery Left   . CHOLECYSTECTOMY N/A 09/22/2018    Procedure: LAPAROSCOPIC CHOLECYSTECTOMY;  Surgeon: Virl Cagey, MD;  Location: [facility];  Service: General;  Laterality: N/A;  . COLONOSCOPY    . COLONOSCOPY N/A 12/26/2014   Procedure: COLONOSCOPY;  Surgeon: Rogene Houston, MD;  [facility];  Service: Endoscopy;  Laterality: N/A;  730  . CORONARY ARTERY BYPASS GRAFT N/A 10/19/2013   Procedure: CORONARY ARTERY BYPASS GRAFTING (CABG);  Surgeon: Rexene Alberts, MD;  Location: [place];  Service: Open Heart Surgery;  Laterality: N/A;  CABG times four using left internal mammary artery and left leg saphenous vein, incision made on right leg but no vein removed  . CYSTOSCOPY  WITH RETROGRADE PYELOGRAM, URETEROSCOPY AND STENT PLACEMENT Left 08/24/2016   Procedure: CYSTOSCOPY WITH LEFT RETROGRADE PYELOGRAM, LEFT URETEROSCOPY, BASKET EXTRACTION LEFT URETERAL STONE;  Surgeon: Irine Seal, MD;  Location: [facility];  Service: Urology;  Laterality: Left;  . ESOPHAGOGASTRODUODENOSCOPY N/A 08/03/2018   Procedure: ESOPHAGOGASTRODUODENOSCOPY (EGD);  Surgeon: Rogene Houston, MD;  [facility];  Service: Endoscopy;  Laterality: N/A;  2:55  . EXTRACORPOREAL SHOCK WAVE LITHOTRIPSY Right 06/18/2019   Procedure: EXTRACORPOREAL SHOCK WAVE LITHOTRIPSY (ESWL);  Surgeon: Lucas Mallow, MD;  Location: [facility];  Service: Urology;  Laterality: Right;  . INTRAOPERATIVE TRANSESOPHAGEAL ECHOCARDIOGRAM N/A 10/19/2013   Procedure: INTRAOPERATIVE TRANSESOPHAGEAL ECHOCARDIOGRAM;  Surgeon: Rexene Alberts, MD;  Location: [place];  Service: Open Heart Surgery;  Laterality: N/A;  . JOINT REPLACEMENT    . LEFT HEART CATHETERIZATION WITH CORONARY ANGIOGRAM N/A 10/09/2013   Procedure: LEFT HEART CATHETERIZATION WITH CORONARY ANGIOGRAM;  Surgeon: Burnell Blanks, MD;  Location: [facility];  Service: Cardiovascular;  Laterality: N/A;  . Left shoulder rotator cuff repair    . LITHOTRIPSY    . TOTAL KNEE ARTHROPLASTY Left   . traumaticvamputation of finger  2015  .  VASECTOMY      FAMILY HISTORY: Family History  Problem Relation Age of Onset  . Hypertension Sister   . Lung cancer Father   . Heart failure Mother     SOCIAL HISTORY: Social History   Socioeconomic History  . Marital status: Married    Spouse name: [name]  . Number of children: 2  . Years of education: Not on file  . Highest education level: 10th grade  Occupational History  . Occupation: lumber mills  Tobacco Use  . Smoking status: Former Smoker    Packs/day: 2.00    Years: 30.00    Pack years: 60.00    Types: Cigarettes    Start date: 08/16/1958    Quit date: 08/17/1983    Years since quitting: 36.4  . Smokeless tobacco: Former Systems developer    Types: Chew  . Tobacco comment: quit smoking 57yrs ago  Substance and Sexual Activity  . Alcohol use: Yes    Alcohol/week: 0.0 standard drinks    Comment: Occassional  . Drug use: No  . Sexual activity: Yes  Other Topics Concern  . Not on file  Social History Narrative   Lives with wife   Caffeine- coffee 3- 4 c daily   Social Determinants of Health   Financial Resource Strain:   . Difficulty of Paying Living Expenses:   Food Insecurity:   . Worried About Charity fundraiser in the Last Year:   . Arboriculturist in the Last Year:   Transportation Needs:   . Film/video editor (Medical):   Marland Kitchen Lack of Transportation (Non-Medical):   Physical Activity:   . Days of Exercise per Week:   . Minutes of Exercise per Session:   Stress:   . Feeling of Stress :   Social Connections:   . Frequency of Communication with Friends and Family:   . Frequency of Social Gatherings with Friends and Family:   . Attends Religious Services:   . Active Member of Clubs or Organizations:   . Attends Archivist Meetings:   Marland Kitchen Marital Status:   Intimate Partner Violence:   . Fear of Current or Ex-Partner:   . Emotionally Abused:   Marland Kitchen Physically Abused:   . Sexually Abused:      PHYSICAL EXAM  GENERAL  EXAM/CONSTITUTIONAL: Vitals:  Vitals:   01/08/20 0935  BP: (!) 154/83  Pulse: 76  Weight: 210 lb 6.4 oz (95.4 kg)   Body mass index is 27.76 kg/m. Wt Readings from Last 10 Encounters:  01/08/20 210 lb 6.4 oz (95.4 kg)  11/14/19 206 lb (93.4 kg)  11/12/19 205 lb 8 oz (93.2 kg)  11/08/19 202 lb (91.6 kg)  10/31/19 207 lb 6.4 oz (94.1 kg)  10/08/19 208 lb 1.6 oz (94.4 kg)  09/24/19 215 lb (97.5 kg)  09/17/19 210 lb 12.8 oz (95.6 kg)  06/18/19 210 lb (95.3 kg)  04/03/19 211 lb (95.7 kg)    Patient is in no distress; well developed, nourished and groomed; neck is supple  CARDIOVASCULAR:  Examination of carotid arteries is normal; no carotid bruits  Regular rate and rhythm, no murmurs  Examination of peripheral vascular system by observation and palpation is normal  EYES:  Ophthalmoscopic exam of optic discs and posterior segments is normal; no papilledema or hemorrhages No exam data present  MUSCULOSKELETAL:  Gait, strength, tone, movements noted in Neurologic exam below  NEUROLOGIC: MENTAL STATUS:  No flowsheet data found.  awake, alert, oriented to person, place and time  recent and remote memory intact  normal attention and concentration  language fluent, comprehension intact, naming intact  fund of knowledge appropriate  CRANIAL NERVE:   2nd - no papilledema on fundoscopic exam  2nd, 3rd, 4th, 6th - pupils equal and reactive to light, visual fields full to confrontation, extraocular muscles intact, no nystagmus; RIGHT PTOSIS   5th - facial sensation symmetric  7th - facial strength symmetric  8th - hearing --> REDUCED  9th - palate elevates symmetrically, uvula midline  11th - shoulder shrug symmetric  12th - tongue protrusion midline   MOTOR:   normal bulk and tone  BUE 5; BLE 5 EXCEPT LEFT HIP FLEXION 3 (LIMITED BY PAIN)  SENSORY:   normal and symmetric to light touch, temperature, vibration  COORDINATION:   finger-nose-finger,  fine finger movements normal  REFLEXES:   deep tendon reflexes TRACE and symmetric  GAIT/STATION:   narrow based gait; USING WALKER     DIAGNOSTIC DATA (LABS, IMAGING, TESTING) - I reviewed patient records, labs, notes, testing and imaging myself where available.  Lab Results  Component Value Date   WBC 4.9 11/07/2019   HGB 15.6 11/07/2019   HCT 46.7 11/07/2019   MCV 91.6 11/07/2019   PLT 163 11/07/2019      Component Value Date/Time   NA 138 11/07/2019 0429   NA 138 06/28/2019 0000   K 3.7 11/07/2019 0429   CL 102 11/07/2019 0429   CO2 27 11/07/2019 0429   GLUCOSE 97 11/07/2019 0429   BUN 12 11/07/2019 0429   BUN 20 06/28/2019 0000   CREATININE 0.88 11/07/2019 0429   CALCIUM 9.0 11/07/2019 0429   PROT 7.0 12/06/2013 0757   ALBUMIN 4.4 06/28/2019 0000   AST 21 06/28/2019 0000   ALT 19 06/28/2019 0000   ALKPHOS 70 06/28/2019 0000   BILITOT 0.7 12/06/2013 0757   GFRNONAA >60 11/07/2019 0429   GFRAA >60 11/07/2019 0429   Lab Results  Component Value Date   CHOL 134 06/28/2019   HDL 49 06/28/2019   LDLCALC 62 06/28/2019   TRIG 157 06/28/2019   CHOLHDL 2.4 12/06/2013   Lab Results  Component Value Date   HGBA1C 5.9 (H) 10/18/2013   No results found for: VITAMINB12 No results found for: TSH  09/17/19 AchR ab -  42 HIGH  10/15/19 CT chest 1. No thymic mass or thymoma. 2.  Aortic Atherosclerosis (ICD10-I70.0). 3. 4.5 mm left lower lobe pulmonary nodule. This is unchanged compared with 04/12/2018. No follow-up needed if patient is low-risk. Non-contrast chest CT can be considered in 12 months if patient is high-risk. This recommendation follows the consensus statement: Guidelines for Management of Incidental Pulmonary Nodules Detected on CT Images: From the Fleischner Society 2017; Radiology 2017; 284:228-243.   ASSESSMENT AND PLAN  74 y.o. year old male here with new onset ptosis, blurred vision, generalized weakness with positive ACH antibody,  consistent with generalized myasthenia gravis.    Dx:  No diagnosis found.  PLAN:  GENERALIZED MYASTHENIA GRAVIS (stable) - continue pyridostigmine 60mg  three times a day - completed IVIG x 5 days (in hospital; March 2021) - continue prednisone 60mg  daily for next 2-3 months; may start slow taper in future; if not tolerated, then may need to start imuran or cellcept - start vitamin D + calcium supplement  Meds ordered this encounter  Medications  . predniSONE (DELTASONE) 20 MG tablet    Sig: Take 3 tablets (60 mg total) by mouth daily.    Dispense:  90 tablet    Refill:  12  . pyridostigmine (MESTINON) 60 MG tablet    Sig: Take 1 tablet (60 mg total) by mouth 3 (three) times daily.    Dispense:  90 tablet    Refill:  12    COVID VACCINE EDUCATION - ok to proceed with 2nd dose COVID vaccine today  Return in about 3 months (around 04/09/2020).    Penni Bombard, MD [id number], [id number] AM Certified in Neurology, Neurophysiology and Neuroimaging  [facility] [address] [place], [place] [address] [phone]

## 2020-01-08 NOTE — Patient Instructions (Signed)
  GENERALIZED MYASTHENIA GRAVIS (stable) - continue pyridostigmine 60mg  three times a day  - continue prednisone 60mg  daily   - start vitamin D + calcium supplement

## 2020-01-08 NOTE — Progress Notes (Signed)
   Covid-19 Vaccination Clinic  Name:  Brandon [name]    MRN: [id number] DOB: 1945-10-26  01/08/2020  Brandon [name] was observed post Covid-19 immunization for 15 minutes without incident. He was provided with Vaccine Information Sheet and instruction to access the V-Safe system.   Brandon [name] was instructed to call 911 with any severe reactions post vaccine: Marland Kitchen Difficulty breathing  . Swelling of face and throat  . A fast heartbeat  . A bad rash all over body  . Dizziness and weakness   Immunizations Administered    Name Date Dose VIS Date Route   Moderna COVID-19 Vaccine 01/08/2020 12:20 PM 0.5 mL 07/2019 Intramuscular   Manufacturer: Moderna   Lot: [id number]   [id number]

## 2020-01-09 NOTE — Telephone Encounter (Signed)
Yes should not delay it until fracture heals.

## 2020-01-10 NOTE — Telephone Encounter (Signed)
Patient aware, TCS has been canceled, patient will call me once he is healed to reschedule

## 2020-01-22 ENCOUNTER — Telehealth: Payer: Self-pay

## 2020-01-22 ENCOUNTER — Other Ambulatory Visit (HOSPITAL_COMMUNITY): Payer: Medicare Other

## 2020-01-22 DIAGNOSIS — M25552 Pain in left hip: Secondary | ICD-10-CM | POA: Diagnosis not present

## 2020-01-22 NOTE — Telephone Encounter (Signed)
Pt's wife, Brandon [name], per DPR, called office to report a red rash on pt's bilateral feet that started last week. The rash is not itchy, painful, or raised. She describes the rash as red with some blue discoloration, similar to bruise, scattered within the rash. She found another similar red spot on his arm today. Pt's wife denies any new medications but does mention that pt has been dx with a stress fracture in his hip and is taking hydrocodone TID for pain from this. He has an appt with Dr. Ricki Rodriguez today. She is wondering if this rash could be from any of his neuro meds. I recommended that pt call his PCP and his dermatologist to get the rash checked out but that I would also let Verneita Griffes, RN and Dr. [name] know of this rash for any additional recommendations. Pt's wife verbalized understanding. Please call pt's wife cell number listed.

## 2020-01-22 NOTE — Telephone Encounter (Signed)
Not likely related to mestinon or prednisone. Follow up with PCP. -VRP

## 2020-01-22 NOTE — Telephone Encounter (Signed)
Spoke with wife and informed her Dr [name] doesn't feel like it's related to mestinon or prednisone. He recommends he call his PCP and dermatologist. She stated she will,  verbalized understanding, appreciation.

## 2020-01-23 DIAGNOSIS — R7309 Other abnormal glucose: Secondary | ICD-10-CM | POA: Diagnosis not present

## 2020-01-23 DIAGNOSIS — I10 Essential (primary) hypertension: Secondary | ICD-10-CM | POA: Diagnosis not present

## 2020-01-23 DIAGNOSIS — I2510 Atherosclerotic heart disease of native coronary artery without angina pectoris: Secondary | ICD-10-CM | POA: Diagnosis not present

## 2020-01-23 DIAGNOSIS — E7849 Other hyperlipidemia: Secondary | ICD-10-CM | POA: Diagnosis not present

## 2020-01-23 DIAGNOSIS — Z01818 Encounter for other preprocedural examination: Secondary | ICD-10-CM | POA: Diagnosis not present

## 2020-01-23 DIAGNOSIS — Z6829 Body mass index (BMI) 29.0-29.9, adult: Secondary | ICD-10-CM | POA: Diagnosis not present

## 2020-01-23 DIAGNOSIS — G7000 Myasthenia gravis without (acute) exacerbation: Secondary | ICD-10-CM | POA: Diagnosis not present

## 2020-01-23 DIAGNOSIS — D689 Coagulation defect, unspecified: Secondary | ICD-10-CM | POA: Diagnosis not present

## 2020-01-23 DIAGNOSIS — S79912D Unspecified injury of left hip, subsequent encounter: Secondary | ICD-10-CM | POA: Diagnosis not present

## 2020-01-24 ENCOUNTER — Encounter (HOSPITAL_COMMUNITY)
Admission: RE | Admit: 2020-01-24 | Discharge: 2020-01-24 | Disposition: A | Discharge status: Home / Self Care | Payer: Medicare Other | Source: Ambulatory Visit | Attending: Orthopedic Surgery | Admitting: Orthopedic Surgery

## 2020-01-24 ENCOUNTER — Telehealth: Payer: Self-pay | Admitting: *Deleted

## 2020-01-24 ENCOUNTER — Encounter (HOSPITAL_COMMUNITY): Payer: Self-pay

## 2020-01-24 ENCOUNTER — Ambulatory Visit (HOSPITAL_COMMUNITY): Admit: 2020-01-24 | Payer: Medicare Other | Admitting: Internal Medicine

## 2020-01-24 ENCOUNTER — Other Ambulatory Visit: Payer: Self-pay

## 2020-01-24 ENCOUNTER — Other Ambulatory Visit (HOSPITAL_COMMUNITY)
Admission: RE | Admit: 2020-01-24 | Discharge: 2020-01-24 | Disposition: A | Discharge status: Home / Self Care | Payer: Medicare Other | Source: Ambulatory Visit | Attending: Orthopedic Surgery | Admitting: Orthopedic Surgery

## 2020-01-24 DIAGNOSIS — Z01812 Encounter for preprocedural laboratory examination: Secondary | ICD-10-CM | POA: Insufficient documentation

## 2020-01-24 DIAGNOSIS — Z20822 Contact with and (suspected) exposure to covid-19: Secondary | ICD-10-CM | POA: Insufficient documentation

## 2020-01-24 LAB — PROTIME-INR
INR: 0.9 (ref 0.8–1.2)
Prothrombin Time: 11.7 seconds (ref 11.4–15.2)

## 2020-01-24 LAB — CBC
HCT: 45.4 % (ref 39.0–52.0)
Hemoglobin: 15.1 g/dL (ref 13.0–17.0)
MCH: 31.1 pg (ref 26.0–34.0)
MCHC: 33.3 g/dL (ref 30.0–36.0)
MCV: 93.4 fL (ref 80.0–100.0)
Platelets: 214 10*3/uL (ref 150–400)
RBC: 4.86 MIL/uL (ref 4.22–5.81)
RDW: 14.4 % (ref 11.5–15.5)
WBC: 10.1 10*3/uL (ref 4.0–10.5)
nRBC: 0 % (ref 0.0–0.2)

## 2020-01-24 LAB — COMPREHENSIVE METABOLIC PANEL
ALT: 29 U/L (ref 0–44)
AST: 22 U/L (ref 15–41)
Albumin: 3.8 g/dL (ref 3.5–5.0)
Alkaline Phosphatase: 75 U/L (ref 38–126)
Anion gap: 12 (ref 5–15)
BUN: 27 mg/dL — ABNORMAL HIGH (ref 8–23)
CO2: 27 mmol/L (ref 22–32)
Calcium: 9.2 mg/dL (ref 8.9–10.3)
Chloride: 97 mmol/L — ABNORMAL LOW (ref 98–111)
Creatinine, Ser: 0.85 mg/dL (ref 0.61–1.24)
GFR calc Af Amer: >60 mL/min (ref >60)
GFR calc non Af Amer: >60 mL/min (ref >60)
Glucose, Bld: 155 mg/dL — ABNORMAL HIGH (ref 70–99)
Potassium: 4.2 mmol/L (ref 3.5–5.1)
Sodium: 136 mmol/L (ref 135–145)
Total Bilirubin: 0.9 mg/dL (ref 0.3–1.2)
Total Protein: 7.1 g/dL (ref 6.5–8.1)

## 2020-01-24 LAB — ABO/RH: ABO/RH(D): O POS

## 2020-01-24 LAB — APTT: aPTT: 24 seconds (ref 24–36)

## 2020-01-24 LAB — SARS CORONAVIRUS 2 (TAT 6-24 HRS): SARS Coronavirus 2: NEGATIVE

## 2020-01-24 SURGERY — COLONOSCOPY
Anesthesia: Moderate Sedation

## 2020-01-24 NOTE — Telephone Encounter (Signed)
Scheduled appt for tomorrow at our [address] location with Dr Therisa Doyne ok per Denyse Amass.  Notified Aliyah at [facility] notified that pt has appt scheduled for clearance at our [address] office. She will arrive early for this DOD appt

## 2020-01-24 NOTE — Progress Notes (Signed)
Cardiology Office Note   Date:  01/25/2020   ID:  Brandon [name] Oct 13, 1945, MRN [id number]  PCP:  Sharilyn Sites, MD    No chief complaint on file.  CAD/preoperative clearance  Wt Readings from Last 3 Encounters:  01/25/20 206 lb 12.8 oz (93.8 kg)  01/24/20 207 lb 1 oz (93.9 kg)  01/24/20 207 lb (93.9 kg)       History of Present Illness: Brandon [name] is a 74 y.o. male who is a patient of Dr. Domenic Polite.  He has had coronary artery disease with CABG in March 2015.  He is scheduled to have a hip replacement in a few days and is here for preoperative evaluation.  In August 2020, he was not having any angina with activity.  He was maintained on medical therapy.  He had a stress fracture on the hip and now needs a hip replacement. Activity has been restricted since that time.  Prior to that, he had been feeling well.  He was walking up the stairs and hills without any problems.   He was diagnosed with myasthenia gravis in 3/21.      Past Medical History:  Diagnosis Date  . Arthritis   . Atherosclerotic heart disease of native coronary artery without angina pectoris   . Cataract   . Coronary atherosclerosis of native coronary artery    Multivessel status post CABG March 2015  . DDD (degenerative disc disease), lumbar    Dr. Carloyn Manner  . Essential hypertension, benign   . GERD (gastroesophageal reflux disease)   . Hard of hearing   . Hemorrhoids   . History of gout   . History of kidney stones   . History of [facility] spotted fever   . Hyperlipidemia, unspecified   . Hypertensive heart disease without heart failure   . Kidney stones   . Lumbago   . Lumbago   . Melena   . Mixed hyperlipidemia   . Ocular myasthenia ([place])   . Personal history of other diseases of the musculoskeletal system and connective tissue   . Plantar fascial fibromatosis   . Pleurodynia   . Renal calculus or stone 05/22/14  . Stress fracture 2021   left hip  . Unspecified  abdominal pain   . Unspecified osteoarthritis, unspecified site     Past Surgical History:  Procedure Laterality Date  . AMPUTATION Right 03/27/2014   Procedure: DEBRIDEMENT AND CLOSURE RIGHT INDEX FINGER;  Surgeon: [name], MD;  Location: [place];  Service: Orthopedics;  Laterality: Right;  . BACK SURGERY    . BICEPT TENODESIS  11/26/2011   Procedure: BICEPT TENODESIS;  Surgeon: Augustin Schooling, MD;  Location: [place];  Service: Orthopedics;  Laterality: Left;  . BIOPSY  08/03/2018   Procedure: BIOPSY;  Surgeon: Rogene Houston, MD;  [facility];  Service: Endoscopy;;  [place]  . Cataract surgery Left   . CHOLECYSTECTOMY N/A 09/22/2018   Procedure: LAPAROSCOPIC CHOLECYSTECTOMY;  Surgeon: Virl Cagey, MD;  Location: [facility];  Service: General;  Laterality: N/A;  . COLONOSCOPY    . COLONOSCOPY N/A 12/26/2014   Procedure: COLONOSCOPY;  Surgeon: Rogene Houston, MD;  [facility];  Service: Endoscopy;  Laterality: N/A;  730  . CORONARY ARTERY BYPASS GRAFT N/A 10/19/2013   Procedure: CORONARY ARTERY BYPASS GRAFTING (CABG);  Surgeon: Rexene Alberts, MD;  Location: [place];  Service: Open Heart Surgery;  Laterality: N/A;  CABG times four using  left internal mammary artery and left leg saphenous vein, incision made on right leg but no vein removed  . CYSTOSCOPY WITH RETROGRADE PYELOGRAM, URETEROSCOPY AND STENT PLACEMENT Left 08/24/2016   Procedure: CYSTOSCOPY WITH LEFT RETROGRADE PYELOGRAM, LEFT URETEROSCOPY, BASKET EXTRACTION LEFT URETERAL STONE;  Surgeon: Irine Seal, MD;  Location[facility];  Service: Urology;  Laterality: Left;  . ESOPHAGOGASTRODUODENOSCOPY N/A 08/03/2018   Procedure: ESOPHAGOGASTRODUODENOSCOPY (EGD);  Surgeon: Rogene Houston, MD;  [facility];  Service: Endoscopy;  Laterality: N/A;  2:55  . EXTRACORPOREAL SHOCK WAVE LITHOTRIPSY Right 06/18/2019   Procedure: EXTRACORPOREAL SHOCK WAVE LITHOTRIPSY (ESWL);  Surgeon: Lucas Mallow, MD;   [facility];  Service: Urology;  Laterality: Right;  . INTRAOPERATIVE TRANSESOPHAGEAL ECHOCARDIOGRAM N/A 10/19/2013   Procedure: INTRAOPERATIVE TRANSESOPHAGEAL ECHOCARDIOGRAM;  Surgeon: Rexene Alberts, MD;  Location: [place];  Service: Open Heart Surgery;  Laterality: N/A;  . JOINT REPLACEMENT    . LEFT HEART CATHETERIZATION WITH CORONARY ANGIOGRAM N/A 10/09/2013   Procedure: LEFT HEART CATHETERIZATION WITH CORONARY ANGIOGRAM;  Surgeon: Burnell Blanks, MD;  Location: [facility];  Service: Cardiovascular;  Laterality: N/A;  . Left shoulder rotator cuff repair    . LITHOTRIPSY    . TOTAL KNEE ARTHROPLASTY Left   . traumaticvamputation of finger  2015  . VASECTOMY       Current Outpatient Medications  Medication Sig Dispense Refill  . acetaminophen (TYLENOL) 650 MG CR tablet Take 650 mg by mouth in the morning and at bedtime.    Marland Kitchen allopurinol (ZYLOPRIM) 300 MG tablet Take 300 mg by mouth daily.    Marland Kitchen amLODipine (NORVASC) 5 MG tablet Take 1 tablet (5 mg total) by mouth daily. 30 tablet 1  . aspirin EC 81 MG tablet Take 1 tablet (81 mg total) by mouth daily. 90 tablet 3  . Calcium Carb-Cholecalciferol (CALCIUM 600/VITAMIN D3 PO) Take 2 tablets by mouth daily.    . Glucosamine-Chondroitin (OSTEO BI-FLEX REGULAR STRENGTH PO) Take 1 tablet by mouth daily.     . hydrochlorothiazide (HYDRODIURIL) 50 MG tablet Take 50 mg by mouth daily.    . Multiple Vitamin (MULITIVITAMIN WITH MINERALS) TABS Take 1 tablet by mouth daily.     . nitroGLYCERIN (NITROSTAT) 0.4 MG SL tablet Place 1 tablet (0.4 mg total) under the tongue every 5 (five) minutes as needed. 25 tablet 3  . Omega-3 Fatty Acids (FISH OIL) 500 MG CAPS Take 500 mg by mouth daily.     . pantoprazole (PROTONIX) 40 MG tablet Take 1 tablet (40 mg total) by mouth daily. 30 tablet 1  . potassium chloride (K-DUR,KLOR-CON) 10 MEQ tablet Take 10 mEq by mouth daily.    . pravastatin (PRAVACHOL) 40 MG tablet Take 40 mg by mouth every evening.       . predniSONE (DELTASONE) 20 MG tablet Take 3 tablets (60 mg total) by mouth daily. 90 tablet 12  . pyridostigmine (MESTINON) 60 MG tablet Take 1 tablet (60 mg total) by mouth 3 (three) times daily. 90 tablet 12  . tamsulosin (FLOMAX) 0.4 MG CAPS capsule Take 0.4 mg by mouth 2 (two) times daily.      No current facility-administered medications for this visit.    Allergies:   Patient has no known allergies.    Social History:  The patient  reports that he quit smoking about 36 years ago. His smoking use included cigarettes. He started smoking about 61 years ago. He has a 60.00 pack-year smoking history. He has quit  using smokeless tobacco.  His smokeless tobacco use included chew. He reports current alcohol use. He reports that he does not use drugs.   Family History:  The patient's family history includes Heart failure in his mother; Hypertension in his sister; Lung cancer in his father.    ROS:  Please see the history of present illness.   Otherwise, review of systems are positive for hip pain.   All other systems are reviewed and negative.    PHYSICAL EXAM: VS:  BP 140/66   Pulse 86   Ht 6\' 1"  (1.854 m)   Wt 206 lb 12.8 oz (93.8 kg)   SpO2 96%   BMI 27.28 kg/m  , BMI Body mass index is 27.28 kg/m. GEN: Well nourished, well developed, in no acute distress  HEENT: normal  Neck: no JVD, carotid bruits, or masses Cardiac: RRR; no murmurs, rubs, or gallops,no edema  Respiratory:  clear to auscultation bilaterally, normal work of breathing GI: soft, nontender, nondistended, + BS MS: no deformity or atrophy  Skin: warm and dry, no rash Neuro:  Strength and sensation are intact Psych: euthymic mood, full affect   EKG:   The ekg ordered in 3/21 demonstrates NSR, inferior Q wave, no ST changes Today, NSR, inferior Q wave, nonspecific lateral ST changes    Recent Labs: 11/06/2019: Magnesium 2.1 01/24/2020: ALT 29; BUN 27; Creatinine, Ser 0.85; Hemoglobin 15.1; Platelets 214;  Potassium 4.2; Sodium 136   Lipid Panel    Component Value Date/Time   CHOL 134 06/28/2019 0000   TRIG 157 06/28/2019 0000   HDL 49 06/28/2019 0000   CHOLHDL 2.4 12/06/2013 0757   VLDL 18 12/06/2013 0757   LDLCALC 62 06/28/2019 0000     Other studies Reviewed: Additional studies/ records that were reviewed today with results demonstrating: Prior records reviewed.   ASSESSMENT AND PLAN:  1. Preoperative cardiovascular examination: He was exercising > 4 METS prior to his recent hip injury.  No further cardiac testing needed before surgery.   2. CAD: No angina.  He has stopped aspirin.  COntinue aggressive secondary prevention.  3. Hyperlipidemia: The current medical regimen is effective;  continue present plan and medications. 4. Hypertension: The current medical regimen is effective;  continue present plan and medications.   Current medicines are reviewed at length with the patient today.  The patient concerns regarding his medicines were addressed.  The following changes have been made:  No change  Labs/ tests ordered today include:  No orders of the defined types were placed in this encounter.   Recommend 150 minutes/week of aerobic exercise Low fat, low carb, high fiber diet recommended  Disposition:   FU with Dr. Domenic Polite.    Signed, Larae Grooms, MD  01/25/2020 3:35 PM    [facility] [place], [place],   [address] Phone: [phone]; Fax: [phone]

## 2020-01-24 NOTE — Telephone Encounter (Signed)
   [facility] Pre-operative Risk Assessment    HEARTCARE STAFF: - Please ensure there is not already an duplicate clearance open for this procedure. - Under Visit Info/Reason for Call, type in Other and utilize the format Clearance MM/DD/YY or Clearance TBD. Do not use dashes or single digits. - If request is for dental extraction, please clarify the # of teeth to be extracted.  Request for surgical clearance:  1. What type of surgery is being performed? LEFT TOTAL HIP FOR FRACTURE  2. When is this surgery scheduled? 01/28/20   3. What type of clearance is required (medical clearance vs. Pharmacy clearance to hold med vs. Both)? MEDICAL  4. Are there any medications that need to be held prior to surgery and how long? ASA    5. Practice name and name of physician performing surgery? [facility]; DR. FRANK ALUISIO   6. What is the office phone number? [phone]   7.   What is the office fax number? [phone] ATTN: [place]  8.   Anesthesia type (None, local, MAC, general) ? CHOICE   Julaine Hua 01/24/2020, 10:31 AM  _________________________________________________________________   (provider comments below)

## 2020-01-24 NOTE — Telephone Encounter (Signed)
Called pt and notified that we are unable to fit him in before surgery on the 6-14. Appt scheduled for 6-23 @245  pm.  Called [facility] and notified Cecille Rubin she will find out what to do and call pt/wife to discuss options and further directions.

## 2020-01-24 NOTE — Progress Notes (Addendum)
PCP - Sharilyn Sites, MD Cardiologist - Rozann Lesches, MD  No stimulator  PPM/ICD -  Device Orders -  Rep Notified -   Chest x-ray - 11-06-19 EKG - 11-06-19 Stress Test -  ECHO -  Cardiac Cath -   Sleep Study -  CPAP -   Fasting Blood Sugar -  Checks Blood Sugar _____ times a day  Blood Thinner Instructions: Aspirin Instructions: 81 mg (Pt has not been advised to discontinue medication)  ERAS Protcol - PRE-SURGERY Ensure or G2-   COVID TEST-  COVID Vaccinations x2   Anesthesia review:   Patient denies shortness of breath, fever, cough and chest pain at PAT appointment   All instructions explained to the patient, with a verbal understanding of the material. Patient agrees to go over the instructions while at home for a better understanding. Patient also instructed to self quarantine after being tested for COVID-19. The opportunity to ask questions was provided.

## 2020-01-24 NOTE — Patient Instructions (Addendum)
DUE TO COVID-19 ONLY ONE VISITOR IS ALLOWED TO COME WITH YOU AND STAY IN THE WAITING ROOM ONLY DURING PRE OP AND PROCEDURE DAY OF SURGERY. THE 1 VISITOR MAY VISIT WITH YOU AFTER SURGERY IN YOUR PRIVATE ROOM DURING VISITING HOURS ONLY!  YOU NEED TO HAVE A COVID 19 TEST ON 01-24-20 @, THIS TEST MUST BE DONE BEFORE SURGERY, COME  [place], [place] , [id number].  ([place]) ONCE YOUR COVID TEST IS COMPLETED, PLEASE BEGIN THE QUARANTINE INSTRUCTIONS AS OUTLINED IN YOUR HANDOUT.                Muath Hallam  01/24/2020   Your procedure is scheduled on: 01-28-20   Report to [facility] Main  Entrance    Report to Admitting at 12:20 PM     Call this number if you have problems the morning of surgery [phone]    Remember: AFTER MIDNIGHT THE NIGHT PRIOR TO SURGERY. NOTHING BY MOUTH EXCEPT CLEAR LIQUIDS UNTIL 11:50 AM . PLEASE FINISH ENSURE DRINK PER SURGEON ORDER  WHICH NEEDS TO BE COMPLETED AT 11:50 AM   CLEAR LIQUID DIET   Foods Allowed                                                                     Foods Excluded  Coffee and tea, regular and decaf                             liquids that you cannot  Plain Jell-O any favor except red or purple                                           see through such as: Fruit ices (not with fruit pulp)                                     milk, soups, orange juice  Iced Popsicles                                    All solid food Carbonated beverages, regular and diet                                    Cranberry, grape and apple juices Sports drinks like Gatorade Lightly seasoned clear broth or consume(fat free) Sugar, honey syrup   _____________________________________________________________________     Take these medicines the morning of surgery with A SIP OF WATER: Allopurinol (Zyloprim), Pantoprazole (Protonix), and Tamsulosin (Flomax), Amlodipine (Norvasc)  BRUSH YOUR TEETH MORNING OF SURGERY AND  RINSE YOUR MOUTH OUT, NO CHEWING GUM CANDY OR MINTS.                                You may not have any metal on your body including  hair pins and              piercings     Do not wear jewelry, cologne, lotions, powders or  deodorant                    Men may shave face and neck.   Do not bring valuables to the hospital. [place].  Contacts, dentures or bridgework may not be worn into surgery.  You may bring small overnight bag      Special Instructions: N/A              Please read over the following fact sheets you were given: _____________________________________________________________________             [facility] - Preparing for Surgery Before surgery, you can play an important role.  Because skin is not sterile, your skin needs to be as free of germs as possible.  You can reduce the number of germs on your skin by washing with CHG (chlorahexidine gluconate) soap before surgery.  CHG is an antiseptic cleaner which kills germs and bonds with the skin to continue killing germs even after washing. Please DO NOT use if you have an allergy to CHG or antibacterial soaps.  If your skin becomes reddened/irritated stop using the CHG and inform your nurse when you arrive at Short Stay. Do not shave (including legs and underarms) for at least 48 hours prior to the first CHG shower.  You may shave your face/neck. Please follow these instructions carefully:  1.  Shower with CHG Soap the night before surgery and the  morning of Surgery.  2.  If you choose to wash your hair, wash your hair first as usual with your  normal  shampoo.  3.  After you shampoo, rinse your hair and body thoroughly to remove the  shampoo.                           4.  Use CHG as you would any other liquid soap.  You can apply chg directly  to the skin and wash                       Gently with a scrungie or clean washcloth.  5.  Apply the CHG Soap to your body  ONLY FROM THE NECK DOWN.   Do not use on face/ open                           Wound or open sores. Avoid contact with eyes, ears mouth and genitals (private parts).                       Wash face,  Genitals (private parts) with your normal soap.             6.  Wash thoroughly, paying special attention to the area where your surgery  will be performed.  7.  Thoroughly rinse your body with warm water from the neck down.  8.  DO NOT shower/wash with your normal soap after using and rinsing off  the CHG Soap.                9.  Pat yourself dry with a clean towel.  10.  Wear clean pajamas.            11.  Place clean sheets on your bed the night of your first shower and do not  sleep with pets. Day of Surgery : Do not apply any lotions/deodorants the morning of surgery.  Please wear clean clothes to the hospital/surgery center.  FAILURE TO FOLLOW THESE INSTRUCTIONS MAY RESULT IN THE CANCELLATION OF YOUR SURGERY PATIENT SIGNATURE_________________________________  NURSE SIGNATURE__________________________________  ________________________________________________________________________   Adam Phenix  An incentive spirometer is a tool that can help keep your lungs clear and active. This tool measures how well you are filling your lungs with each breath. Taking long deep breaths may help reverse or decrease the chance of developing breathing (pulmonary) problems (especially infection) following:  A long period of time when you are unable to move or be active. BEFORE THE PROCEDURE   If the spirometer includes an indicator to show your best effort, your nurse or respiratory therapist will set it to a desired goal.  If possible, sit up straight or lean slightly forward. Try not to slouch.  Hold the incentive spirometer in an upright position. INSTRUCTIONS FOR USE  1. Sit on the edge of your bed if possible, or sit up as far as you can in bed or on a chair. 2. Hold the  incentive spirometer in an upright position. 3. Breathe out normally. 4. Place the mouthpiece in your mouth and seal your lips tightly around it. 5. Breathe in slowly and as deeply as possible, raising the piston or the ball toward the top of the column. 6. Hold your breath for 3-5 seconds or for as long as possible. Allow the piston or ball to fall to the bottom of the column. 7. Remove the mouthpiece from your mouth and breathe out normally. 8. Rest for a few seconds and repeat Steps 1 through 7 at least 10 times every 1-2 hours when you are awake. Take your time and take a few normal breaths between deep breaths. 9. The spirometer may include an indicator to show your best effort. Use the indicator as a goal to work toward during each repetition. 10. After each set of 10 deep breaths, practice coughing to be sure your lungs are clear. If you have an incision (the cut made at the time of surgery), support your incision when coughing by placing a pillow or rolled up towels firmly against it. Once you are able to get out of bed, walk around indoors and cough well. You may stop using the incentive spirometer when instructed by your caregiver.  RISKS AND COMPLICATIONS  Take your time so you do not get dizzy or light-headed.  If you are in pain, you may need to take or ask for pain medication before doing incentive spirometry. It is harder to take a deep breath if you are having pain. AFTER USE  Rest and breathe slowly and easily.  It can be helpful to keep track of a log of your progress. Your caregiver can provide you with a simple table to help with this. If you are using the spirometer at home, follow these instructions: [place] IF:   You are having difficultly using the spirometer.  You have trouble using the spirometer as often as instructed.  Your pain medication is not giving enough relief while using the spirometer.  You develop fever of 100.5 F (38.1 C) or  higher. SEEK IMMEDIATE MEDICAL CARE IF:   You cough up bloody  sputum that had not been present before.  You develop fever of 102 F (38.9 C) or greater.  You develop worsening pain at or near the incision site. MAKE SURE YOU:   Understand these instructions.  Will watch your condition.  Will get help right away if you are not doing well or get worse. Document Released: 12/13/2006 Document Revised: 10/25/2011 Document Reviewed: 02/13/2007 ExitCare Patient Information 2014 Exit[employer], [place].   ________________________________________________________________________  WHAT IS A BLOOD TRANSFUSION? Blood Transfusion Information  A transfusion is the replacement of blood or some of its parts. Blood is made up of multiple cells which provide different functions.  Red blood cells carry oxygen and are used for blood loss replacement.  White blood cells fight against infection.  Platelets control bleeding.  Plasma helps clot blood.  Other blood products are available for specialized needs, such as hemophilia or other clotting disorders. BEFORE THE TRANSFUSION  Who gives blood for transfusions?   Healthy volunteers who are fully evaluated to make sure their blood is safe. This is blood bank blood. Transfusion therapy is the safest it has ever been in the practice of medicine. Before blood is taken from a donor, a complete history is taken to make sure that person has no history of diseases nor engages in risky social behavior (examples are intravenous drug use or sexual activity with multiple partners). The donor's travel history is screened to minimize risk of transmitting infections, such as malaria. The donated blood is tested for signs of infectious diseases, such as HIV and hepatitis. The blood is then tested to be sure it is compatible with you in order to minimize the chance of a transfusion reaction. If you or a relative donates blood, this is often done in anticipation of surgery  and is not appropriate for emergency situations. It takes many days to process the donated blood. RISKS AND COMPLICATIONS Although transfusion therapy is very safe and saves many lives, the main dangers of transfusion include:   Getting an infectious disease.  Developing a transfusion reaction. This is an allergic reaction to something in the blood you were given. Every precaution is taken to prevent this. The decision to have a blood transfusion has been considered carefully by your caregiver before blood is given. Blood is not given unless the benefits outweigh the risks. AFTER THE TRANSFUSION  Right after receiving a blood transfusion, you will usually feel much better and more energetic. This is especially true if your red blood cells have gotten low (anemic). The transfusion raises the level of the red blood cells which carry oxygen, and this usually causes an energy increase.  The nurse administering the transfusion will monitor you carefully for complications. HOME CARE INSTRUCTIONS  No special instructions are needed after a transfusion. You may find your energy is better. Speak with your caregiver about any limitations on activity for underlying diseases you may have. SEEK MEDICAL CARE IF:   Your condition is not improving after your transfusion.  You develop redness or irritation at the intravenous (IV) site. SEEK IMMEDIATE MEDICAL CARE IF:  Any of the following symptoms occur over the next 12 hours:  Shaking chills.  You have a temperature by mouth above 102 F (38.9 C), not controlled by medicine.  Chest, back, or muscle pain.  People around you feel you are not acting correctly or are confused.  Shortness of breath or difficulty breathing.  Dizziness and fainting.  You get a rash or develop hives.  You have a decrease  in urine output.  Your urine turns a dark color or changes to pink, red, or brown. Any of the following symptoms occur over the next 10  days:  You have a temperature by mouth above 102 F (38.9 C), not controlled by medicine.  Shortness of breath.  Weakness after normal activity.  The white part of the eye turns yellow (jaundice).  You have a decrease in the amount of urine or are urinating less often.  Your urine turns a dark color or changes to pink, red, or brown. Document Released: 07/30/2000 Document Revised: 10/25/2011 Document Reviewed: 03/18/2008 [facility] Patient Information 2014 [place], [place].  _______________________________________________________________________

## 2020-01-24 NOTE — Telephone Encounter (Signed)
Primary Cardiologist:Samuel Domenic Polite, MD  Chart reviewed as part of pre-operative protocol coverage. Because of Brandon [name]'s past medical history and time since last visit, he/she will require a follow-up visit in order to better assess preoperative cardiovascular risk.  Pre-op covering staff: - Please schedule appointment and call patient to inform them. - Please contact requesting surgeon's office via preferred method (i.e, phone, fax) to inform them of need for appointment prior to surgery.  If applicable, this message will also be routed to pharmacy pool and/or primary cardiologist for input on holding anticoagulant/antiplatelet agent as requested below so that this information is available at time of patient's appointment.   Deberah Pelton, NP  01/24/2020, 12:29 PM

## 2020-01-24 NOTE — Telephone Encounter (Signed)
Follow up   A PA from [facility] is calling about clearance request    Please call

## 2020-01-25 ENCOUNTER — Encounter: Payer: Self-pay | Admitting: Interventional Cardiology

## 2020-01-25 ENCOUNTER — Ambulatory Visit (INDEPENDENT_AMBULATORY_CARE_PROVIDER_SITE_OTHER): Payer: Medicare Other | Admitting: Interventional Cardiology

## 2020-01-25 VITALS — BP 140/66 | HR 86 | Ht 73.0 in | Wt 206.8 lb

## 2020-01-25 DIAGNOSIS — E782 Mixed hyperlipidemia: Secondary | ICD-10-CM | POA: Diagnosis not present

## 2020-01-25 DIAGNOSIS — Z01810 Encounter for preprocedural cardiovascular examination: Secondary | ICD-10-CM

## 2020-01-25 DIAGNOSIS — I25119 Atherosclerotic heart disease of native coronary artery with unspecified angina pectoris: Secondary | ICD-10-CM

## 2020-01-25 DIAGNOSIS — I10 Essential (primary) hypertension: Secondary | ICD-10-CM

## 2020-01-25 LAB — SURGICAL PCR SCREEN
MRSA, PCR: POSITIVE — AB
Staphylococcus aureus: POSITIVE — AB

## 2020-01-25 NOTE — Patient Instructions (Signed)
Medication Instructions:  Your physician recommends that you continue on your current medications as directed. Please refer to the Current Medication list given to you today.  *If you need a refill on your cardiac medications before your next appointment, please call your pharmacy*   Lab Work: None If you have labs (blood work) drawn today and your tests are completely normal, you will receive your results only by: Marland Kitchen MyChart Message (if you have MyChart) OR . A paper copy in the mail If you have any lab test that is abnormal or we need to change your treatment, we will call you to review the results.   Testing/Procedures: None   Follow-Up: Keep appointment with Dr. Domenic Polite in August   Other Instructions None

## 2020-01-25 NOTE — Progress Notes (Signed)
PCR result routed to Dr. Anne Fu office for review.

## 2020-01-28 ENCOUNTER — Observation Stay (HOSPITAL_COMMUNITY): Payer: Medicare Other

## 2020-01-28 ENCOUNTER — Ambulatory Visit (HOSPITAL_COMMUNITY): Payer: Medicare Other | Admitting: Anesthesiology

## 2020-01-28 ENCOUNTER — Other Ambulatory Visit: Payer: Self-pay

## 2020-01-28 ENCOUNTER — Observation Stay (HOSPITAL_COMMUNITY)
Admission: RE | Admit: 2020-01-28 | Discharge: 2020-01-29 | Disposition: A | Discharge status: Home / Self Care | Payer: Medicare Other | Attending: Orthopedic Surgery | Admitting: Orthopedic Surgery

## 2020-01-28 ENCOUNTER — Encounter (HOSPITAL_COMMUNITY): Payer: Self-pay | Admitting: Orthopedic Surgery

## 2020-01-28 ENCOUNTER — Encounter (HOSPITAL_COMMUNITY)
Admission: RE | Disposition: A | Discharge status: Home / Self Care | Payer: Self-pay | Source: Home / Self Care | Attending: Orthopedic Surgery

## 2020-01-28 ENCOUNTER — Ambulatory Visit (HOSPITAL_COMMUNITY): Payer: Medicare Other

## 2020-01-28 DIAGNOSIS — Z7982 Long term (current) use of aspirin: Secondary | ICD-10-CM | POA: Insufficient documentation

## 2020-01-28 DIAGNOSIS — Z7952 Long term (current) use of systemic steroids: Secondary | ICD-10-CM | POA: Diagnosis not present

## 2020-01-28 DIAGNOSIS — Z419 Encounter for procedure for purposes other than remedying health state, unspecified: Secondary | ICD-10-CM

## 2020-01-28 DIAGNOSIS — M5136 Other intervertebral disc degeneration, lumbar region: Secondary | ICD-10-CM | POA: Insufficient documentation

## 2020-01-28 DIAGNOSIS — Z87891 Personal history of nicotine dependence: Secondary | ICD-10-CM | POA: Insufficient documentation

## 2020-01-28 DIAGNOSIS — Z79899 Other long term (current) drug therapy: Secondary | ICD-10-CM | POA: Insufficient documentation

## 2020-01-28 DIAGNOSIS — M169 Osteoarthritis of hip, unspecified: Secondary | ICD-10-CM

## 2020-01-28 DIAGNOSIS — Z87442 Personal history of urinary calculi: Secondary | ICD-10-CM | POA: Diagnosis not present

## 2020-01-28 DIAGNOSIS — I2510 Atherosclerotic heart disease of native coronary artery without angina pectoris: Secondary | ICD-10-CM | POA: Diagnosis not present

## 2020-01-28 DIAGNOSIS — M109 Gout, unspecified: Secondary | ICD-10-CM | POA: Insufficient documentation

## 2020-01-28 DIAGNOSIS — Z20822 Contact with and (suspected) exposure to covid-19: Secondary | ICD-10-CM | POA: Diagnosis not present

## 2020-01-28 DIAGNOSIS — K219 Gastro-esophageal reflux disease without esophagitis: Secondary | ICD-10-CM | POA: Diagnosis not present

## 2020-01-28 DIAGNOSIS — Z951 Presence of aortocoronary bypass graft: Secondary | ICD-10-CM | POA: Diagnosis not present

## 2020-01-28 DIAGNOSIS — E782 Mixed hyperlipidemia: Secondary | ICD-10-CM | POA: Insufficient documentation

## 2020-01-28 DIAGNOSIS — S72002A Fracture of unspecified part of neck of left femur, initial encounter for closed fracture: Secondary | ICD-10-CM | POA: Diagnosis present

## 2020-01-28 DIAGNOSIS — I119 Hypertensive heart disease without heart failure: Secondary | ICD-10-CM | POA: Diagnosis not present

## 2020-01-28 DIAGNOSIS — Z96642 Presence of left artificial hip joint: Secondary | ICD-10-CM | POA: Diagnosis not present

## 2020-01-28 DIAGNOSIS — Z96649 Presence of unspecified artificial hip joint: Secondary | ICD-10-CM

## 2020-01-28 DIAGNOSIS — Z471 Aftercare following joint replacement surgery: Secondary | ICD-10-CM | POA: Diagnosis not present

## 2020-01-28 DIAGNOSIS — M1612 Unilateral primary osteoarthritis, left hip: Principal | ICD-10-CM | POA: Insufficient documentation

## 2020-01-28 HISTORY — PX: TOTAL HIP ARTHROPLASTY: SHX124

## 2020-01-28 LAB — TYPE AND SCREEN
ABO/RH(D): O POS
Antibody Screen: NEGATIVE

## 2020-01-28 SURGERY — ARTHROPLASTY, HIP, TOTAL, ANTERIOR APPROACH
Anesthesia: Spinal | Site: Hip | Laterality: Left

## 2020-01-28 MED ORDER — PREDNISONE 50 MG PO TABS: 120.0000 mg | ORAL_TABLET | Freq: Two times a day (BID) | ORAL | Status: DC

## 2020-01-28 MED ORDER — FENTANYL CITRATE (PF) 100 MCG/2ML IJ SOLN: 25.0000 ug | INTRAMUSCULAR | Status: DC | PRN

## 2020-01-28 MED ORDER — CEFAZOLIN SODIUM-DEXTROSE 2-4 GM/100ML-% IV SOLN
2.0000 g | Freq: Four times a day (QID) | INTRAVENOUS | Status: AC
  Administered 2020-01-28 – 2020-01-29 (×2): 2 g via INTRAVENOUS
  Filled 2020-01-28 (×2): qty 100

## 2020-01-28 MED ORDER — PROMETHAZINE HCL 25 MG/ML IJ SOLN: 6.2500 mg | INTRAMUSCULAR | Status: DC | PRN

## 2020-01-28 MED ORDER — BUPIVACAINE HCL 0.25 % IJ SOLN
INTRAMUSCULAR | Status: DC | PRN
  Administered 2020-01-28: 30 mL

## 2020-01-28 MED ORDER — POTASSIUM CHLORIDE CRYS ER 10 MEQ PO TBCR
10.0000 meq | EXTENDED_RELEASE_TABLET | Freq: Every day | ORAL | Status: DC
  Administered 2020-01-29: 10 meq via ORAL
  Filled 2020-01-28: qty 1

## 2020-01-28 MED ORDER — PHENYLEPHRINE HCL-NACL 10-0.9 MG/250ML-% IV SOLN
INTRAVENOUS | Status: DC | PRN
Start: 2020-01-28 — End: 2020-01-28
  Administered 2020-01-28: 100 ug/min via INTRAVENOUS
  Administered 2020-01-28: 60 ug/min via INTRAVENOUS
  Administered 2020-01-28: 50 ug/min via INTRAVENOUS

## 2020-01-28 MED ORDER — PRAVASTATIN SODIUM 20 MG PO TABS: 40.0000 mg | ORAL_TABLET | Freq: Every evening | ORAL | Status: DC

## 2020-01-28 MED ORDER — BUPIVACAINE IN DEXTROSE 0.75-8.25 % IT SOLN
INTRATHECAL | Status: DC | PRN
  Administered 2020-01-28: 1.6 mL via INTRATHECAL

## 2020-01-28 MED ORDER — SODIUM CHLORIDE 0.9 % IV SOLN: INTRAVENOUS | Status: DC

## 2020-01-28 MED ORDER — FENTANYL CITRATE (PF) 250 MCG/5ML IJ SOLN
INTRAMUSCULAR | Status: AC
  Filled 2020-01-28: qty 5

## 2020-01-28 MED ORDER — PREDNISONE 50 MG PO TABS
60.0000 mg | ORAL_TABLET | Freq: Two times a day (BID) | ORAL | Status: DC
  Administered 2020-01-29: 60 mg via ORAL
  Filled 2020-01-28: qty 1

## 2020-01-28 MED ORDER — HYDROCODONE-ACETAMINOPHEN 5-325 MG PO TABS: 1.0000 | ORAL_TABLET | ORAL | Status: DC | PRN

## 2020-01-28 MED ORDER — PREDNISONE 50 MG PO TABS
120.0000 mg | ORAL_TABLET | Freq: Once | ORAL | Status: AC
  Administered 2020-01-28: 120 mg via ORAL
  Filled 2020-01-28: qty 1

## 2020-01-28 MED ORDER — PHENYLEPHRINE HCL (PRESSORS) 10 MG/ML IV SOLN
INTRAVENOUS | Status: AC
  Filled 2020-01-28: qty 1

## 2020-01-28 MED ORDER — MIDAZOLAM HCL 5 MG/5ML IJ SOLN
INTRAMUSCULAR | Status: DC | PRN
  Administered 2020-01-28: 1 mg via INTRAVENOUS

## 2020-01-28 MED ORDER — LACTATED RINGERS IV SOLN: INTRAVENOUS | Status: DC

## 2020-01-28 MED ORDER — WATER FOR IRRIGATION, STERILE IR SOLN
Status: DC | PRN
  Administered 2020-01-28: 2000 mL

## 2020-01-28 MED ORDER — ONDANSETRON HCL 4 MG/2ML IJ SOLN: 4.0000 mg | Freq: Four times a day (QID) | INTRAMUSCULAR | Status: DC | PRN

## 2020-01-28 MED ORDER — MAGNESIUM CITRATE PO SOLN: 1.0000 | Freq: Once | ORAL | Status: DC | PRN

## 2020-01-28 MED ORDER — CEFAZOLIN SODIUM-DEXTROSE 2-4 GM/100ML-% IV SOLN
2.0000 g | INTRAVENOUS | Status: AC
  Administered 2020-01-28: 2 g via INTRAVENOUS
  Filled 2020-01-28: qty 100

## 2020-01-28 MED ORDER — METOCLOPRAMIDE HCL 5 MG PO TABS: 5.0000 mg | ORAL_TABLET | Freq: Three times a day (TID) | ORAL | Status: DC | PRN

## 2020-01-28 MED ORDER — TRAMADOL HCL 50 MG PO TABS
50.0000 mg | ORAL_TABLET | Freq: Four times a day (QID) | ORAL | Status: DC | PRN
  Administered 2020-01-28: 50 mg via ORAL
  Administered 2020-01-29: 100 mg via ORAL
  Administered 2020-01-29: 50 mg via ORAL
  Filled 2020-01-28 (×2): qty 1
  Filled 2020-01-28: qty 2

## 2020-01-28 MED ORDER — FENTANYL CITRATE (PF) 100 MCG/2ML IJ SOLN
INTRAMUSCULAR | Status: DC | PRN
  Administered 2020-01-28: 50 ug via INTRAVENOUS

## 2020-01-28 MED ORDER — PANTOPRAZOLE SODIUM 40 MG PO TBEC
40.0000 mg | DELAYED_RELEASE_TABLET | Freq: Every day | ORAL | Status: DC
  Administered 2020-01-29: 40 mg via ORAL
  Filled 2020-01-28: qty 1

## 2020-01-28 MED ORDER — PROPOFOL 500 MG/50ML IV EMUL
INTRAVENOUS | Status: DC | PRN
  Administered 2020-01-28: 30 mg via INTRAVENOUS

## 2020-01-28 MED ORDER — CHLORHEXIDINE GLUCONATE 0.12 % MT SOLN
15.0000 mL | Freq: Once | OROMUCOSAL | Status: AC
  Administered 2020-01-28: 15 mL via OROMUCOSAL

## 2020-01-28 MED ORDER — POVIDONE-IODINE 10 % EX SWAB
2.0000 "application " | Freq: Once | CUTANEOUS | Status: AC
  Administered 2020-01-28: 2 via TOPICAL

## 2020-01-28 MED ORDER — FENTANYL CITRATE (PF) 100 MCG/2ML IJ SOLN
50.0000 ug | INTRAMUSCULAR | Status: AC
  Administered 2020-01-28 (×2): 50 ug via INTRAVENOUS

## 2020-01-28 MED ORDER — POLYETHYLENE GLYCOL 3350 17 G PO PACK: 17.0000 g | PACK | Freq: Every day | ORAL | Status: DC | PRN

## 2020-01-28 MED ORDER — PYRIDOSTIGMINE BROMIDE 60 MG PO TABS
60.0000 mg | ORAL_TABLET | Freq: Three times a day (TID) | ORAL | Status: DC
  Administered 2020-01-28 – 2020-01-29 (×2): 60 mg via ORAL
  Filled 2020-01-28 (×3): qty 1

## 2020-01-28 MED ORDER — MIDAZOLAM HCL 2 MG/2ML IJ SOLN
INTRAMUSCULAR | Status: AC
  Filled 2020-01-28: qty 2

## 2020-01-28 MED ORDER — ALBUMIN HUMAN 5 % IV SOLN
INTRAVENOUS | Status: DC | PRN
Start: 2020-01-28 — End: 2020-01-28

## 2020-01-28 MED ORDER — DOCUSATE SODIUM 100 MG PO CAPS
100.0000 mg | ORAL_CAPSULE | Freq: Two times a day (BID) | ORAL | Status: DC
  Administered 2020-01-28 – 2020-01-29 (×2): 100 mg via ORAL
  Filled 2020-01-28 (×2): qty 1

## 2020-01-28 MED ORDER — METOCLOPRAMIDE HCL 5 MG/ML IJ SOLN: 5.0000 mg | Freq: Three times a day (TID) | INTRAMUSCULAR | Status: DC | PRN

## 2020-01-28 MED ORDER — DEXAMETHASONE SODIUM PHOSPHATE 10 MG/ML IJ SOLN
INTRAMUSCULAR | Status: AC
  Filled 2020-01-28: qty 1

## 2020-01-28 MED ORDER — MORPHINE SULFATE (PF) 2 MG/ML IV SOLN: 0.5000 mg | INTRAVENOUS | Status: DC | PRN

## 2020-01-28 MED ORDER — METHOCARBAMOL 500 MG IVPB - SIMPLE MED
500.0000 mg | Freq: Four times a day (QID) | INTRAVENOUS | Status: DC | PRN
  Filled 2020-01-28: qty 50

## 2020-01-28 MED ORDER — 0.9 % SODIUM CHLORIDE (POUR BTL) OPTIME
TOPICAL | Status: DC | PRN
  Administered 2020-01-28: 1000 mL

## 2020-01-28 MED ORDER — PROPOFOL 500 MG/50ML IV EMUL
INTRAVENOUS | Status: DC | PRN
  Administered 2020-01-28: 75 ug/kg/min via INTRAVENOUS

## 2020-01-28 MED ORDER — ACETAMINOPHEN 10 MG/ML IV SOLN
1000.0000 mg | Freq: Four times a day (QID) | INTRAVENOUS | Status: DC
  Administered 2020-01-28: 1000 mg via INTRAVENOUS
  Filled 2020-01-28: qty 100

## 2020-01-28 MED ORDER — RIVAROXABAN 10 MG PO TABS
10.0000 mg | ORAL_TABLET | Freq: Every day | ORAL | Status: DC
  Administered 2020-01-29: 10 mg via ORAL
  Filled 2020-01-28: qty 1

## 2020-01-28 MED ORDER — AMLODIPINE BESYLATE 5 MG PO TABS
5.0000 mg | ORAL_TABLET | Freq: Every day | ORAL | Status: DC
  Administered 2020-01-29: 5 mg via ORAL
  Filled 2020-01-28: qty 1

## 2020-01-28 MED ORDER — METHOCARBAMOL 500 MG PO TABS
500.0000 mg | ORAL_TABLET | Freq: Four times a day (QID) | ORAL | Status: DC | PRN
  Administered 2020-01-28: 500 mg via ORAL
  Filled 2020-01-28: qty 1

## 2020-01-28 MED ORDER — ALLOPURINOL 300 MG PO TABS
300.0000 mg | ORAL_TABLET | Freq: Every day | ORAL | Status: DC
  Administered 2020-01-29: 300 mg via ORAL
  Filled 2020-01-28: qty 1

## 2020-01-28 MED ORDER — DEXAMETHASONE SODIUM PHOSPHATE 10 MG/ML IJ SOLN
8.0000 mg | Freq: Once | INTRAMUSCULAR | Status: AC
  Administered 2020-01-28: 10 mg via INTRAVENOUS

## 2020-01-28 MED ORDER — MENTHOL 3 MG MT LOZG: 1.0000 | LOZENGE | OROMUCOSAL | Status: DC | PRN

## 2020-01-28 MED ORDER — NITROGLYCERIN 0.4 MG SL SUBL: 0.4000 mg | SUBLINGUAL_TABLET | SUBLINGUAL | Status: DC | PRN

## 2020-01-28 MED ORDER — HYDROCODONE-ACETAMINOPHEN 7.5-325 MG PO TABS: 1.0000 | ORAL_TABLET | ORAL | Status: DC | PRN

## 2020-01-28 MED ORDER — PREDNISONE 50 MG PO TABS: 60.0000 mg | ORAL_TABLET | Freq: Every day | ORAL | Status: DC

## 2020-01-28 MED ORDER — PHENYLEPHRINE HCL-NACL 10-0.9 MG/250ML-% IV SOLN
INTRAVENOUS | Status: DC | PRN
Start: 2020-01-28 — End: 2020-01-28

## 2020-01-28 MED ORDER — ONDANSETRON HCL 4 MG PO TABS: 4.0000 mg | ORAL_TABLET | Freq: Four times a day (QID) | ORAL | Status: DC | PRN

## 2020-01-28 MED ORDER — PROPOFOL 10 MG/ML IV BOLUS
INTRAVENOUS | Status: AC
  Filled 2020-01-28: qty 20

## 2020-01-28 MED ORDER — HYDROCHLOROTHIAZIDE 25 MG PO TABS
50.0000 mg | ORAL_TABLET | Freq: Every day | ORAL | Status: DC
  Administered 2020-01-29: 50 mg via ORAL
  Filled 2020-01-28: qty 2

## 2020-01-28 MED ORDER — CELECOXIB 200 MG PO CAPS
200.0000 mg | ORAL_CAPSULE | Freq: Once | ORAL | Status: AC
  Administered 2020-01-28: 200 mg via ORAL
  Filled 2020-01-28: qty 1

## 2020-01-28 MED ORDER — ACETAMINOPHEN 325 MG PO TABS: 325.0000 mg | ORAL_TABLET | Freq: Four times a day (QID) | ORAL | Status: DC | PRN

## 2020-01-28 MED ORDER — ONDANSETRON HCL 4 MG/2ML IJ SOLN
INTRAMUSCULAR | Status: AC
  Filled 2020-01-28: qty 2

## 2020-01-28 MED ORDER — EPHEDRINE SULFATE 50 MG/ML IJ SOLN
INTRAMUSCULAR | Status: DC | PRN
Start: 2020-01-28 — End: 2020-01-28
  Administered 2020-01-28: 10 mg via INTRAVENOUS

## 2020-01-28 MED ORDER — TAMSULOSIN HCL 0.4 MG PO CAPS
0.4000 mg | ORAL_CAPSULE | Freq: Two times a day (BID) | ORAL | Status: DC
  Administered 2020-01-28 – 2020-01-29 (×2): 0.4 mg via ORAL
  Filled 2020-01-28 (×2): qty 1

## 2020-01-28 MED ORDER — BISACODYL 10 MG RE SUPP: 10.0000 mg | Freq: Every day | RECTAL | Status: DC | PRN

## 2020-01-28 MED ORDER — FENTANYL CITRATE (PF) 100 MCG/2ML IJ SOLN
INTRAMUSCULAR | Status: AC
  Filled 2020-01-28: qty 2

## 2020-01-28 MED ORDER — BUPIVACAINE HCL 0.25 % IJ SOLN
INTRAMUSCULAR | Status: AC
  Filled 2020-01-28: qty 1

## 2020-01-28 MED ORDER — PHENOL 1.4 % MT LIQD: 1.0000 | OROMUCOSAL | Status: DC | PRN

## 2020-01-28 MED ORDER — TRANEXAMIC ACID-NACL 1000-0.7 MG/100ML-% IV SOLN
1000.0000 mg | INTRAVENOUS | Status: AC
  Administered 2020-01-28: 1000 mg via INTRAVENOUS
  Filled 2020-01-28: qty 100

## 2020-01-28 MED ORDER — FENTANYL CITRATE (PF) 100 MCG/2ML IJ SOLN
INTRAMUSCULAR | Status: AC
  Administered 2020-01-28: 50 ug
  Filled 2020-01-28: qty 2

## 2020-01-28 MED ORDER — ORAL CARE MOUTH RINSE: 15.0000 mL | Freq: Once | OROMUCOSAL | Status: AC

## 2020-01-28 MED ORDER — VANCOMYCIN HCL IN DEXTROSE 1-5 GM/200ML-% IV SOLN
1000.0000 mg | INTRAVENOUS | Status: AC
  Administered 2020-01-28: 1000 mg via INTRAVENOUS
  Filled 2020-01-28: qty 200

## 2020-01-28 SURGICAL SUPPLY — 49 items
BAG DECANTER FOR FLEXI CONT (MISCELLANEOUS) IMPLANT
BAG SPEC THK2 15X12 ZIP CLS (MISCELLANEOUS)
BAG ZIPLOCK 12X15 (MISCELLANEOUS) IMPLANT
BALL HIP CERAMIC (Hips) IMPLANT
BLADE SAG 18X100X1.27 (BLADE) ×2 IMPLANT
CLSR STERI-STRIP ANTIMIC 1/2X4 (GAUZE/BANDAGES/DRESSINGS) ×2 IMPLANT
COVER PERINEAL POST (MISCELLANEOUS) ×2 IMPLANT
COVER SURGICAL LIGHT HANDLE (MISCELLANEOUS) ×2 IMPLANT
COVER WAND RF STERILE (DRAPES) ×1 IMPLANT
CUP ACETBLR 52 OD PINNACLE (Hips) ×1 IMPLANT
DECANTER SPIKE VIAL GLASS SM (MISCELLANEOUS) ×2 IMPLANT
DRAPE STERI IOBAN 125X83 (DRAPES) ×2 IMPLANT
DRAPE U-SHAPE 47X51 STRL (DRAPES) ×4 IMPLANT
DRESSING AQUACEL AG SP 3.5X10 (GAUZE/BANDAGES/DRESSINGS) IMPLANT
DRSG ADAPTIC 3X8 NADH LF (GAUZE/BANDAGES/DRESSINGS) ×2 IMPLANT
DRSG AQUACEL AG ADV 3.5X10 (GAUZE/BANDAGES/DRESSINGS) ×2 IMPLANT
DRSG AQUACEL AG SP 3.5X10 (GAUZE/BANDAGES/DRESSINGS) ×2
DURAPREP 26ML APPLICATOR (WOUND CARE) ×2 IMPLANT
ELECT REM PT RETURN 15FT ADLT (MISCELLANEOUS) ×2 IMPLANT
EVACUATOR 1/8 PVC DRAIN (DRAIN) IMPLANT
GLOVE BIO SURGEON STRL SZ 6 (GLOVE) IMPLANT
GLOVE BIO SURGEON STRL SZ7 (GLOVE) IMPLANT
GLOVE BIO SURGEON STRL SZ8 (GLOVE) ×2 IMPLANT
GLOVE BIOGEL PI IND STRL 6.5 (GLOVE) IMPLANT
GLOVE BIOGEL PI IND STRL 7.0 (GLOVE) IMPLANT
GLOVE BIOGEL PI IND STRL 8 (GLOVE) ×1 IMPLANT
GLOVE BIOGEL PI INDICATOR 6.5 (GLOVE)
GLOVE BIOGEL PI INDICATOR 7.0 (GLOVE)
GLOVE BIOGEL PI INDICATOR 8 (GLOVE) ×1
GOWN STRL REUS W/TWL LRG LVL3 (GOWN DISPOSABLE) ×2 IMPLANT
GOWN STRL REUS W/TWL XL LVL3 (GOWN DISPOSABLE) IMPLANT
HIP BALL CERAMIC (Hips) ×2 IMPLANT
HOLDER FOLEY CATH W/STRAP (MISCELLANEOUS) ×2 IMPLANT
KIT TURNOVER KIT A (KITS) IMPLANT
LINER MARATHON NEUT +4X52X32 (Hips) ×1 IMPLANT
MANIFOLD NEPTUNE II (INSTRUMENTS) ×2 IMPLANT
PACK ANTERIOR HIP CUSTOM (KITS) ×2 IMPLANT
PENCIL SMOKE EVACUATOR COATED (MISCELLANEOUS) ×2 IMPLANT
STEM FEMORAL SZ9 HIGH ACTIS (Stem) ×1 IMPLANT
STRIP CLOSURE SKIN 1/2X4 (GAUZE/BANDAGES/DRESSINGS) ×2 IMPLANT
SUT ETHIBOND NAB CT1 #1 30IN (SUTURE) ×2 IMPLANT
SUT MNCRL AB 4-0 PS2 18 (SUTURE) ×2 IMPLANT
SUT STRATAFIX 0 PDS 27 VIOLET (SUTURE) ×2
SUT VIC AB 2-0 CT1 27 (SUTURE) ×4
SUT VIC AB 2-0 CT1 TAPERPNT 27 (SUTURE) ×2 IMPLANT
SUTURE STRATFX 0 PDS 27 VIOLET (SUTURE) ×1 IMPLANT
SYR 50ML LL SCALE MARK (SYRINGE) IMPLANT
TRAY FOLEY MTR SLVR 16FR STAT (SET/KITS/TRAYS/PACK) ×2 IMPLANT
YANKAUER SUCT BULB TIP 10FT TU (MISCELLANEOUS) ×2 IMPLANT

## 2020-01-28 NOTE — Anesthesia Procedure Notes (Signed)
Procedure Name: MAC Date/Time: 01/28/2020 2:38 PM Performed by: Lissa Morales, CRNA Pre-anesthesia Checklist: Patient identified, Emergency Drugs available, Suction available, Patient being monitored and Timeout performed Patient Re-evaluated:Patient Re-evaluated prior to induction Oxygen Delivery Method: Simple face mask Placement Confirmation: positive ETCO2

## 2020-01-28 NOTE — Anesthesia Procedure Notes (Signed)
Spinal  Patient location during procedure: OR Start time: 01/28/2020 2:38 PM End time: 01/28/2020 2:45 PM Preanesthetic Checklist Completed: patient identified, IV checked, site marked, risks and benefits discussed, surgical consent, monitors and equipment checked, pre-op evaluation and timeout performed Spinal Block Patient position: sitting Prep: DuraPrep Patient monitoring: heart rate, cardiac monitor, continuous pulse ox and blood pressure Approach: midline Location: L3-4 Injection technique: single-shot Needle Needle type: [place]  Needle gauge: 22 G Needle length: 9 cm Assessment Sensory level: T4

## 2020-01-28 NOTE — H&P (Signed)
TOTAL HIP ADMISSION H&P  Patient is admitted for left total hip arthroplasty.  Subjective:  Chief Complaint: Left hip pain  HPI: Brandon [name], 74 y.o. male, has a history of pain and functional disability in the left hip due to prolonged steroid treatment and patient has failed non-surgical conservative treatments for greater than 12 weeks to include NSAID's and/or analgesics and activity modification. Onset of symptoms was abrupt. The patient noted no past surgery on the left hip. Patient currently rates pain in the left hip at 10 out of 10 with activity. Patient has night pain, worsening of pain with activity and weight bearing, pain that interfers with activities of daily living and pain with passive range of motion. Patient has evidence of his femoral head is collapsed. In comparison to his MRI from several weeks ago, this fracture is in the femoral head. He has collapse of over 50 percent of the head. This is an acute finding by imaging studies. This condition presents safety issues increasing the risk of falls.  There is no current active infection.  Patient Active Problem List   Diagnosis Date Noted   History of colonic polyps 11/12/2019   GERD (gastroesophageal reflux disease) 11/06/2019   Myasthenia gravis with (acute) exacerbation (HCC) 11/06/2019   Acute diarrhea 09/17/2019   Heme positive stool 09/17/2019   Unspecified osteoarthritis, unspecified site    Hyperlipidemia, unspecified    Personal history of other diseases of the musculoskeletal system and connective tissue    Atherosclerotic heart disease of native coronary artery without angina pectoris    Hypertensive heart disease without heart failure    Melena    Plantar fascial fibromatosis    Pleurodynia    Calculus of gallbladder without cholecystitis without obstruction 09/14/2018   Nausea without vomiting 06/05/2018   Postoperative atrial fibrillation ([place]) 11/09/2013   S/P CABG x 4 10/19/2013    Coronary atherosclerosis of native coronary artery 10/18/2013   Essential hypertension, benign 09/18/2013   Family history of hypertrophic cardiomyopathy 09/18/2013   Mixed hyperlipidemia     Past Medical History:  Diagnosis Date   Arthritis    Atherosclerotic heart disease of native coronary artery without angina pectoris    Cataract    Coronary atherosclerosis of native coronary artery    Multivessel status post CABG March 2015   DDD (degenerative disc disease), lumbar    Dr. 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[place])    Personal history of other diseases of the musculoskeletal system and connective tissue    Plantar fascial fibromatosis    Pleurodynia    Renal calculus or stone 05/22/14   Stress fracture 2021   left hip   Unspecified abdominal pain    Unspecified osteoarthritis, unspecified site     Past Surgical History:  Procedure Laterality Date   AMPUTATION Right 03/27/2014   Procedure: DEBRIDEMENT AND CLOSURE RIGHT INDEX FINGER;  Surgeon: [name], MD;  Location: [employer] [facility];  Service: Orthopedics;  Laterality: Right;   BACK SURGERY     BICEPT TENODESIS  11/26/2011   Procedure: BICEPT TENODESIS;  Surgeon: Augustin Schooling, MD;  Location: [place];  Service: Orthopedics;  Laterality: Left;   BIOPSY  08/03/2018   Procedure: BIOPSY;  Surgeon: Rogene Houston, MD;  [facility];  Service: Endoscopy;;  antral   Cataract surgery Left    CHOLECYSTECTOMY N/A 09/22/2018   Procedure: LAPAROSCOPIC CHOLECYSTECTOMY;  Surgeon: Virl Cagey, MD;  Location: [facility];  Service: General;   Laterality: N/A;   COLONOSCOPY     COLONOSCOPY N/A 12/26/2014   Procedure: COLONOSCOPY;  Surgeon: Rogene Houston, MD;  [facility];  Service: Endoscopy;  Laterality: N/A;  730   CORONARY ARTERY BYPASS GRAFT N/A 10/19/2013   Procedure: CORONARY ARTERY BYPASS GRAFTING (CABG);  Surgeon: Rexene Alberts, MD;  Location: [place];  Service: Open Heart Surgery;  Laterality: N/A;  CABG times four using left internal mammary artery and left leg saphenous vein, incision made on right leg but no vein removed   CYSTOSCOPY WITH RETROGRADE PYELOGRAM, URETEROSCOPY AND STENT PLACEMENT Left 08/24/2016   Procedure: CYSTOSCOPY WITH LEFT RETROGRADE PYELOGRAM, LEFT URETEROSCOPY, BASKET EXTRACTION LEFT URETERAL STONE;  Surgeon: Irine Seal, MD;  Location: [facility];  Service: Urology;  Laterality: Left;   ESOPHAGOGASTRODUODENOSCOPY N/A 08/03/2018   Procedure: ESOPHAGOGASTRODUODENOSCOPY (EGD);  Surgeon: Rogene Houston, MD;  [facility];  Service: Endoscopy;  Laterality: N/A;  2:55   EXTRACORPOREAL SHOCK WAVE LITHOTRIPSY Right 06/18/2019   Procedure: EXTRACORPOREAL SHOCK WAVE LITHOTRIPSY (ESWL);  Surgeon: Lucas Mallow, MD;  Location: [facility];  Service: Urology;  Laterality: Right;   INTRAOPERATIVE TRANSESOPHAGEAL ECHOCARDIOGRAM N/A 10/19/2013   Procedure: INTRAOPERATIVE TRANSESOPHAGEAL ECHOCARDIOGRAM;  Surgeon: Rexene Alberts, MD;  Location: [place];  Service: Open Heart Surgery;  Laterality: N/A;   JOINT REPLACEMENT     LEFT HEART CATHETERIZATION WITH CORONARY ANGIOGRAM N/A 10/09/2013   Procedure: LEFT HEART CATHETERIZATION WITH CORONARY ANGIOGRAM;  Surgeon: Burnell Blanks, MD;  [facility];  Service: Cardiovascular;  Laterality: N/A;   Left shoulder rotator cuff repair     LITHOTRIPSY     TOTAL KNEE ARTHROPLASTY Left    traumaticvamputation of finger  2015   VASECTOMY      Prior to Admission medications   Medication Sig Start Date End Date Taking? Authorizing  Provider  acetaminophen (TYLENOL) 650 MG CR tablet Take 650 mg by mouth in the morning and at bedtime.   Yes [provider]  allopurinol (ZYLOPRIM) 300 MG tablet Take 300 mg by mouth daily.   Yes [provider]  amLODipine (NORVASC) 5 MG tablet Take 1 tablet (5 mg total) by mouth daily. 11/11/19  Yes Shelly Coss, MD  aspirin EC 81 MG tablet Take 1 tablet (81 mg total) by mouth daily. 08/04/18  Yes Rehman, Mechele Dawley, MD  Calcium Carb-Cholecalciferol (CALCIUM 600/VITAMIN D3 PO) Take 2 tablets by mouth daily.   Yes [provider]  Glucosamine-Chondroitin (OSTEO BI-FLEX REGULAR STRENGTH PO) Take 1 tablet by mouth daily.    Yes [provider]  hydrochlorothiazide (HYDRODIURIL) 50 MG tablet Take 50 mg by mouth daily.   Yes [provider]  Multiple Vitamin (MULITIVITAMIN WITH MINERALS) TABS Take 1 tablet by mouth daily.    Yes [provider]  nitroGLYCERIN (NITROSTAT) 0.4 MG SL tablet Place 1 tablet (0.4 mg total) under the tongue every 5 (five) minutes as needed. 04/03/19  Yes Satira Sark, MD  Omega-3 Fatty Acids (FISH OIL) 500 MG CAPS Take  500 mg by mouth daily.    Yes [provider]  pantoprazole (PROTONIX) 40 MG tablet Take 1 tablet (40 mg total) by mouth daily. 11/10/19 11/09/20 Yes Shelly Coss, MD  potassium chloride (K-DUR,KLOR-CON) 10 MEQ tablet Take 10 mEq by mouth daily.   Yes [provider]  pravastatin (PRAVACHOL) 40 MG tablet Take 40 mg by mouth every evening.    Yes [provider]  predniSONE (DELTASONE) 20 MG tablet Take 3 tablets (60 mg total) by mouth daily. 01/08/20 02/01/21 Yes Penumalli, Earlean Polka, MD  pyridostigmine (MESTINON) 60 MG tablet Take 1 tablet (60 mg total) by mouth 3 (three) times daily. 01/08/20  Yes Penumalli, Earlean Polka, MD  tamsulosin (FLOMAX) 0.4 MG CAPS capsule Take 0.4 mg by mouth 2 (two) times daily.  07/12/16  Yes [provider]    No Known Allergies  Social  History   Socioeconomic History   Marital status: Married    Spouse name: [name]   Number of children: 2   Years of education: Not on file   Highest education level: 10th grade  Occupational History   Occupation: Associate Professor [employer]  Tobacco Use   Smoking status: Former Smoker    Packs/day: 2.00    Years: 30.00    Pack years: 60.00    Types: Cigarettes    Start date: 08/16/1958    Quit date: 08/17/1983    Years since quitting: 36.4   Smokeless tobacco: Former Systems developer    Types: Chew   Tobacco comment: quit smoking 50yrs ago  Vaping Use   Vaping Use: Never used  Substance and Sexual Activity   Alcohol use: Yes    Alcohol/week: 0.0 standard drinks    Comment: Occassional   Drug use: No   Sexual activity: Yes  Other Topics Concern   Not on file  Social History Narrative   Lives with wife   Caffeine- coffee 3- 4 c daily   Social Determinants of Health   Financial Resource Strain:    Difficulty of Paying Living Expenses:   Food Insecurity:    Worried About Charity fundraiser in the Last Year:    Arboriculturist in the Last Year:   Transportation Needs:    Film/video editor (Medical):    Lack of Transportation (Non-Medical):   Physical Activity:    Days of Exercise per Week:    Minutes of Exercise per Session:   Stress:    Feeling of Stress :   Social Connections:    Frequency of Communication with Friends and Family:    Frequency of Social Gatherings with Friends and Family:    Attends Religious Services:    Active Member of Clubs or Organizations:    Attends Archivist Meetings:    Marital Status:   Intimate Partner Violence:    Fear of Current or Ex-Partner:    Emotionally Abused:    Physically Abused:    Sexually Abused:       Tobacco Use: Medium Risk   Smoking Tobacco Use: Former Smoker   Smokeless Tobacco Use: Former Systems developer   Social History   Substance and Sexual Activity  Alcohol Use Yes   Alcohol/week: 0.0  standard drinks   Comment: Occassional    Family History  Problem Relation Age of Onset   Hypertension Sister    Lung cancer Father    Heart failure Mother     Review of Systems  Constitutional: Negative for chills and fever.  HENT: Negative  for congestion, sore throat and tinnitus.   Eyes: Negative for double vision, photophobia and pain.  Respiratory: Negative for cough, shortness of breath and wheezing.   Cardiovascular: Negative for chest pain, palpitations and orthopnea.  Gastrointestinal: Negative for heartburn, nausea and vomiting.  Genitourinary: Negative for dysuria, frequency and urgency.  Musculoskeletal: Positive for joint pain.  Neurological: Negative for dizziness, weakness and headaches.     Objective:  Physical Exam: Well nourished and well developed.  General: Alert and oriented x3, cooperative and pleasant, no acute distress.  Head: normocephalic, atraumatic, neck supple.  Eyes: EOMI.  Respiratory: breath sounds clear in all fields, no wheezing, rales, or rhonchi. Cardiovascular: Regular rate and rhythm, no murmurs, gallops or rubs.  Abdomen: non-tender to palpation and soft, normoactive bowel sounds. Musculoskeletal:  Left Hip Exam: The range of motion: Flexion to 100 degrees, Internal Rotation to 20 degrees, External Rotation to 20 degrees, and abduction to 30 degrees without discomfort. There is tenderness over the greater trochanteric bursa. There is no pain on provocative testing of the hip. There is no evidence of any shortening in the leg. Everything is moving solitary as one unit.  Calves soft and nontender. Motor function intact in LE. Strength 5/5 LE bilaterally. Neuro: Distal pulses 2+. Sensation to light touch intact in LE.  Imaging Review Radiographs- AP pelvis, AP and lateral of the left hip dated 01/22/2020 demonstrate that his femoral head is collapsed. In comparison to his MRI from several weeks ago, this fracture is in the femoral  head. He has collapse of over 50 percent of the head. This is an acute finding.  Assessment/Plan:  End stage arthritis, left hip  The patient history, physical examination, clinical judgement of the provider and imaging studies are consistent with end stage degenerative joint disease of the left hip and total hip arthroplasty is deemed medically necessary. The treatment options including medical management, injection therapy, arthroscopy and arthroplasty were discussed at length. The risks and benefits of total hip arthroplasty were presented and reviewed. The risks due to aseptic loosening, infection, stiffness, dislocation/subluxation, thromboembolic complications and other imponderables were discussed. The patient acknowledged the explanation, agreed to proceed with the plan and consent was signed. Patient is being admitted for inpatient treatment for surgery, pain control, PT, OT, prophylactic antibiotics, VTE prophylaxis, progressive ambulation and ADLs and discharge planning.The patient is planning to be discharged home.   Patient's anticipated LOS is less than 2 midnights, meeting these requirements: - Younger than 105 - Lives within 1 hour of care - Has a competent adult at home to recover with post-op recover - NO history of  - Chronic pain requiring opiods  - Diabetes  - Coronary Artery Disease  - Heart failure  - Heart attack  - Stroke  - DVT/VTE  - Cardiac arrhythmia  - Respiratory Failure/COPD  - Renal failure  - Anemia  - Advanced Liver disease  Therapy Plans: HEP Disposition: Home with wife Planned DVT Prophylaxis: Xarelto 10 mg QD DME Needed: None PCP: Lane Hacker, MD  Cardiologist: Ezequiel Kayser on [place] TXA: IV Allergies: NKDA Anesthesia Concerns: None BMI: 27.3  Other:  - Hx of CABG (takes 81 mg ASA at baseline) - Taking Norco 5-325 mg TID  - Patient was instructed on what medications to stop prior to surgery. - Follow-up visit in 2 weeks with Dr.  Wynelle Link - Begin physical therapy following surgery - Pre-operative lab work as pre-surgical testing - Prescriptions will be provided in hospital at time of discharge  Theresa Duty, PA-C Orthopedic Surgery [facility] [place]

## 2020-01-28 NOTE — Discharge Instructions (Addendum)
Gaynelle Arabian, MD Total Joint Specialist [facility] [place] [address], [address] [place],  [address] [phone]  ANTERIOR APPROACH TOTAL HIP REPLACEMENT POSTOPERATIVE DIRECTIONS     Hip Rehabilitation, Guidelines Following Surgery  The results of a hip operation are greatly improved after range of motion and muscle strengthening exercises. Follow all safety measures which are given to protect your hip. If any of these exercises cause increased pain or swelling in your joint, decrease the amount until you are comfortable again. Then slowly increase the exercises. Call your caregiver if you have problems or questions.   BLOOD CLOT PREVENTION . Take a 10 mg Xarelto once a day for three weeks following surgery. Then resume one 81 mg aspirin once a day. . You may resume your vitamins/supplements once you have discontinued the Xarelto. . Do not take any NSAIDs (Advil, Aleve, Ibuprofen, Meloxicam, etc.) until you have discontinued the Xarelto.   HOME CARE INSTRUCTIONS  . Remove items at home which could result in a fall. This includes throw rugs or furniture in walking pathways.   ICE to the affected hip as frequently as 20-30 minutes an hour and then as needed for pain and swelling. Continue to use ice on the hip for pain and swelling from surgery. You may notice swelling that will progress down to the foot and ankle. This is normal after surgery. Elevate the leg when you are not up walking on it.    Continue to use the breathing machine which will help keep your temperature down.  It is common for your temperature to cycle up and down following surgery, especially at night when you are not up moving around and exerting yourself.  The breathing machine keeps your lungs expanded and your temperature down.  DIET You may resume your previous home diet once your are discharged from the hospital.  DRESSING / WOUND CARE / SHOWERING . You have an adhesive waterproof bandage  over the incision. Leave this in place until your first follow-up appointment. Once you remove this you will not need to place another bandage.  . You may begin showering 3 days following surgery, but do not submerge the incision under water.  ACTIVITY . For the first 3-5 days, it is important to rest and keep the operative leg elevated. You should, as a general rule, rest for 50 minutes and walk/stretch for 10 minutes per hour. After 5 days, you may slowly increase activity as tolerated.  Marland Kitchen Perform the exercises you were provided twice a day for about 15-20 minutes each session. Begin these 2 days following surgery. . Walk with your walker as instructed. Use the walker until you are comfortable transitioning to a cane. Walk with the cane in the opposite hand of the operative leg. You may discontinue the cane once you are comfortable and walking steadily. . Avoid periods of inactivity such as sitting longer than an hour when not asleep. This helps prevent blood clots.  . Do not drive a car for 6 weeks or until released by your surgeon.  . Do not drive while taking narcotics.  TED HOSE STOCKINGS Wear the elastic stockings on both legs for three weeks following surgery during the day. You may remove them at night while sleeping.  WEIGHT BEARING Weight bearing as tolerated with assist device (walker, cane, etc) as directed, use it as long as suggested by your surgeon or therapist, typically at least 4-6 weeks.  POSTOPERATIVE CONSTIPATION PROTOCOL Constipation - defined medically as fewer than three  stools per week and severe constipation as less than one stool per week.  One of the most common issues patients have following surgery is constipation.  Even if you have a regular bowel pattern at home, your normal regimen is likely to be disrupted due to multiple reasons following surgery.  Combination of anesthesia, postoperative narcotics, change in appetite and fluid intake all can affect your  bowels.  In order to avoid complications following surgery, here are some recommendations in order to help you during your recovery period.  . Colace (docusate) - Pick up an over-the-counter form of Colace or another stool softener and take twice a day as long as you are requiring postoperative pain medications.  Take with a full glass of water daily.  If you experience loose stools or diarrhea, hold the colace until you stool forms back up.  If your symptoms do not get better within 1 week or if they get worse, check with your doctor. . Dulcolax (bisacodyl) - Pick up over-the-counter and take as directed by the product packaging as needed to assist with the movement of your bowels.  Take with a full glass of water.  Use this product as needed if not relieved by Colace only.  . MiraLax (polyethylene glycol) - Pick up over-the-counter to have on hand.  MiraLax is a solution that will increase the amount of water in your bowels to assist with bowel movements.  Take as directed and can mix with a glass of water, juice, soda, coffee, or tea.  Take if you go more than two days without a movement.Do not use MiraLax more than once per day. Call your doctor if you are still constipated or irregular after using this medication for 7 days in a row.  If you continue to have problems with postoperative constipation, please contact the office for further assistance and recommendations.  If you experience "the worst abdominal pain ever" or develop nausea or vomiting, please contact the office immediatly for further recommendations for treatment.  ITCHING  If you experience itching with your medications, try taking only a single pain pill, or even half a pain pill at a time.  You can also use Benadryl over the counter for itching or also to help with sleep.   MEDICATIONS See your medication summary on the "After Visit Summary" that the nursing staff will review with you prior to discharge.  You may have some home  medications which will be placed on hold until you complete the course of blood thinner medication.  It is important for you to complete the blood thinner medication as prescribed by your surgeon.  Continue your approved medications as instructed at time of discharge.  PRECAUTIONS If you experience chest pain or shortness of breath - call 911 immediately for transfer to the hospital emergency department.  If you develop a fever greater that 101 F, purulent drainage from wound, increased redness or drainage from wound, foul odor from the wound/dressing, or calf pain - CONTACT YOUR SURGEON.                                                   FOLLOW-UP APPOINTMENTS Make sure you keep all of your appointments after your operation with your surgeon and caregivers. You should call the office at the above phone number and make an appointment for approximately two  weeks after the date of your surgery or on the date instructed by your surgeon outlined in the "After Visit Summary".  RANGE OF MOTION AND STRENGTHENING EXERCISES  These exercises are designed to help you keep full movement of your hip joint. Follow your caregiver's or physical therapist's instructions. Perform all exercises about fifteen times, three times per day or as directed. Exercise both hips, even if you have had only one joint replacement. These exercises can be done on a training (exercise) mat, on the floor, on a table or on a bed. Use whatever works the best and is most comfortable for you. Use music or television while you are exercising so that the exercises are a pleasant break in your day. This will make your life better with the exercises acting as a break in routine you can look forward to.  . Lying on your back, slowly slide your foot toward your buttocks, raising your knee up off the floor. Then slowly slide your foot back down until your leg is straight again.  . Lying on your back spread your legs as far apart as you can without  causing discomfort.  . Lying on your side, raise your upper leg and foot straight up from the floor as far as is comfortable. Slowly lower the leg and repeat.  . Lying on your back, tighten up the muscle in the front of your thigh (quadriceps muscles). You can do this by keeping your leg straight and trying to raise your heel off the floor. This helps strengthen the largest muscle supporting your knee.  . Lying on your back, tighten up the muscles of your buttocks both with the legs straight and with the knee bent at a comfortable angle while keeping your heel on the floor.   IF YOU ARE TRANSFERRED TO A SKILLED REHAB FACILITY If the patient is transferred to a skilled rehab facility following release from the hospital, a list of the current medications will be sent to the facility for the patient to continue.  When discharged from the skilled rehab facility, please have the facility set up the patient's [place] prior to being released. Also, the skilled facility will be responsible for providing the patient with their medications at time of release from the facility to include their pain medication, the muscle relaxants, and their blood thinner medication. If the patient is still at the rehab facility at time of the two week follow up appointment, the skilled rehab facility will also need to assist the patient in arranging follow up appointment in our office and any transportation needs.  MAKE SURE YOU:  . Understand these instructions.  . Get help right away if you are not doing well or get worse.    DENTAL ANTIBIOTICS:  In most cases prophylactic antibiotics for Dental procdeures after total joint surgery are not necessary.  Exceptions are as follows:  1. History of prior total joint infection  2. Severely immunocompromised (Organ Transplant, cancer chemotherapy, Rheumatoid biologic meds such as [place]in)  3. Poorly controlled diabetes (A1C &gt; 8.0, blood glucose over  200)  If you have one of these conditions, contact your surgeon for an antibiotic prescription, prior to your dental procedure.    Pick up stool softner and laxative for home use following surgery while on pain medications. Do not submerge incision under water. Please use good hand washing techniques while changing dressing each day. May shower starting three days after surgery. Please use a clean towel to pat  the incision dry following showers. Continue to use ice for pain and swelling after surgery. Do not use any lotions or creams on the incision until instructed by your surgeon.   Information on my medicine - XARELTO (Rivaroxaban)  This medication education was reviewed with me or my healthcare representative as part of my discharge preparation.  The pharmacist that spoke with me during my hospital stay was:    Why was Xarelto prescribed for you? Xarelto was prescribed for you to reduce the risk of blood clots forming after orthopedic surgery. The medical term for these abnormal blood clots is venous thromboembolism (VTE).  What do you need to know about xarelto ? Take your Xarelto ONCE DAILY at the same time every day. You may take it either with or without food.  If you have difficulty swallowing the tablet whole, you may crush it and mix in applesauce just prior to taking your dose.  Take Xarelto exactly as prescribed by your doctor and DO NOT stop taking Xarelto without talking to the doctor who prescribed the medication.  Stopping without other VTE prevention medication to take the place of Xarelto may increase your risk of developing a clot.  After discharge, you should have regular check-up appointments with your healthcare provider that is prescribing your Xarelto.    What do you do if you miss a dose? If you miss a dose, take it as soon as you remember on the same day then continue your regularly scheduled once daily regimen the next day. Do not take two doses  of Xarelto on the same day.   Important Safety Information A possible side effect of Xarelto is bleeding. You should call your healthcare provider right away if you experience any of the following: ? Bleeding from an injury or your nose that does not stop. ? Unusual colored urine (red or dark brown) or unusual colored stools (red or black). ? Unusual bruising for unknown reasons. ? A serious fall or if you hit your head (even if there is no bleeding).  Some medicines may interact with Xarelto and might increase your risk of bleeding while on Xarelto. To help avoid this, consult your healthcare provider or pharmacist prior to using any new prescription or non-prescription medications, including herbals, vitamins, non-steroidal anti-inflammatory drugs (NSAIDs) and supplements.  This website has more information on Xarelto: [email or web address]

## 2020-01-28 NOTE — Transfer of Care (Signed)
Immediate Anesthesia Transfer of Care Note  [name]  Procedure(s) Performed: TOTAL HIP ARTHROPLASTY ANTERIOR APPROACH (Left Hip)  Patient Location: PACU  Anesthesia Type:Spinal  Level of Consciousness: awake, alert , oriented and patient cooperative  Airway & Oxygen Therapy: Patient Spontanous Breathing and Patient connected to face mask oxygen  Post-op Assessment: Report given to RN and Post -op Vital signs reviewed and stable  Post vital signs: stable  Last Vitals:  Vitals Value Taken Time  BP 113/76 01/28/20 1617  Temp    Pulse 68 01/28/20 1621  Resp 17 01/28/20 1621  SpO2 100 % 01/28/20 1621  Vitals shown include unvalidated device data.  Last Pain:  Vitals:   01/28/20 1418  TempSrc:   PainSc: 4       Patients Stated Pain Goal: 3 (83/09/40 7680)  Complications: No complications documented.

## 2020-01-28 NOTE — Anesthesia Preprocedure Evaluation (Addendum)
Anesthesia Evaluation  Patient identified by MRN, date of birth, ID band Patient awake    Reviewed: Allergy & Precautions, NPO status , Patient's Chart, lab work & pertinent test results  History of Anesthesia Complications Negative for: history of anesthetic complications  Airway Mallampati: II       Dental  (+) Teeth Intact, Dental Advidsory Given   Pulmonary former smoker,    breath sounds clear to auscultation       Cardiovascular hypertension, On Medications + CAD, + CABG and + Peripheral Vascular Disease  Normal cardiovascular exam+ dysrhythmias Atrial Fibrillation  Rhythm:Regular Rate:Normal  Study Conclusions   - Left ventricle: Normal left ventricular outflow tract  gradient. Wall thickness was increased in a pattern of  severe LVH. Systolic function was normal. The estimated  ejection fraction was in the range of 60% to 65%. Wall  motion was normal; there were no regional wall motion  abnormalities. There was an increased relative  contribution of atrial contraction to ventricular filling.  Doppler parameters are consistent with abnormal left  ventricular relaxation (grade 1 diastolic dysfunction).  There was no evidence of elevated ventricular filling  pressure by Doppler parameters.  - Aortic valve: Trileaflet; mildly thickened leaflets. There  was no stenosis.  - Mitral valve: No systolic motion of the anterior mitral  leaflet was noted. A small torn chord was appreciated,  with no evidence of mitral regurgitation. Calcified  annulus.  - Left atrium: The atrium was mildly dilated.     Neuro/Psych negative neurological ROS  negative psych ROS   GI/Hepatic Neg liver ROS, GERD  Medicated,  Endo/Other  negative endocrine ROS  Renal/GU negative Renal ROS     Musculoskeletal   Abdominal   Peds  Hematology   Anesthesia Other Findings   Reproductive/Obstetrics                             Anesthesia Physical  Anesthesia Plan  ASA: III  Anesthesia Plan: Spinal   Post-op Pain Management:    Induction:   PONV Risk Score and Plan: 2 and Ondansetron and Propofol infusion  Airway Management Planned: Natural Airway and Simple Face Mask  Additional Equipment:   Intra-op Plan:   Post-operative Plan:   Informed Consent: I have reviewed the patients History and Physical, chart, labs and discussed the procedure including the risks, benefits and alternatives for the proposed anesthesia with the patient or authorized representative who has indicated his/her understanding and acceptance.     Dental advisory given  Plan Discussed with: Anesthesiologist and CRNA  Anesthesia Plan Comments:       Anesthesia Quick Evaluation

## 2020-01-28 NOTE — Interval H&P Note (Signed)
History and Physical Interval Note:  01/28/2020 1:53 PM  Brandon [name]  has presented today for surgery, with the diagnosis of Left hip femoral head fracture.  The various methods of treatment have been discussed with the patient and family. After consideration of risks, benefits and other options for treatment, the patient has consented to  Procedure(s) with comments: [place] (Left) - 117min as a surgical intervention.  The patient's history has been reviewed, patient examined, no change in status, stable for surgery.  I have reviewed the patient's chart and labs.  Questions were answered to the patient's satisfaction.     [name]

## 2020-01-28 NOTE — Telephone Encounter (Signed)
See Progress note 01-25-2020 per Dr Irish Lack

## 2020-01-28 NOTE — Anesthesia Postprocedure Evaluation (Signed)
Anesthesia Post Note  [name]  Procedure(s) Performed: TOTAL HIP ARTHROPLASTY ANTERIOR APPROACH (Left Hip)     Patient location during evaluation: PACU Anesthesia Type: Spinal Level of consciousness: awake and alert Pain management: pain level controlled Vital Signs Assessment: post-procedure vital signs reviewed and stable Respiratory status: spontaneous breathing and respiratory function stable Cardiovascular status: blood pressure returned to baseline and stable Postop Assessment: spinal receding Anesthetic complications: no   No complications documented.  Last Vitals:  Vitals:   01/28/20 1715 01/28/20 1739  BP: 140/85 (!) 164/91  Pulse: 64 69  Resp: 17 14  Temp: 36.7 C   SpO2: 100% 100%    Last Pain:  Vitals:   01/28/20 1700  TempSrc:   PainSc: 0-No pain                 Alyzza Andringa DANIEL

## 2020-01-28 NOTE — Op Note (Signed)
OPERATIVE REPORT- TOTAL HIP ARTHROPLASTY   PREOPERATIVE DIAGNOSIS: Osteoarthritis of the Left hip.   POSTOPERATIVE DIAGNOSIS: Osteoarthritis of the Left  hip.   PROCEDURE: Left total hip arthroplasty, anterior approach.   SURGEON: Gaynelle Arabian, MD   ASSISTANT: Theresa Duty, PA-C  ANESTHESIA:  Spinal  ESTIMATED BLOOD LOSS:-200 mL    DRAINS: Hemovac x1.   COMPLICATIONS: None   CONDITION: PACU - hemodynamically stable.   BRIEF CLINICAL NOTE: Brandon [name] is a 74 y.o. male who has advanced end-  stage arthritis of their Left  hip with progressively worsening pain and  dysfunction.The patient has failed nonoperative management and presents for  total hip arthroplasty.   PROCEDURE IN DETAIL: After successful administration of spinal  anesthetic, the traction boots for the [facility] bed were placed on both  feet and the patient was placed onto the [facility] bed, boots placed into the leg  holders. The Left hip was then isolated from the perineum with plastic  drapes and prepped and draped in the usual sterile fashion. ASIS and  greater trochanter were marked and a oblique incision was made, starting  at about 1 cm lateral and 2 cm distal to the ASIS and coursing towards  the anterior cortex of the femur. The skin was cut with a 10 blade  through subcutaneous tissue to the level of the fascia overlying the  tensor fascia lata muscle. The fascia was then incised in line with the  incision at the junction of the anterior third and posterior 2/3rd. The  muscle was teased off the fascia and then the interval between the TFL  and the rectus was developed. The Hohmann retractor was then placed at  the top of the femoral neck over the capsule. The vessels overlying the  capsule were cauterized and the fat on top of the capsule was removed.  A Hohmann retractor was then placed anterior underneath the rectus  femoris to give exposure to the entire anterior capsule. A T-shaped   capsulotomy was performed. The edges were tagged and the femoral head  was identified.       Osteophytes are removed off the superior acetabulum.  The femoral neck was then cut in situ with an oscillating saw. Traction  was then applied to the left lower extremity utilizing the [facility]  traction. The femoral head was then removed. Retractors were placed  around the acetabulum and then circumferential removal of the labrum was  performed. Osteophytes were also removed. Reaming starts at 47 mm to  medialize and  Increased in 2 mm increments to 51 mm. We reamed in  approximately 40 degrees of abduction, 20 degrees anteversion. A 52 mm  pinnacle acetabular shell was then impacted in anatomic position under  fluoroscopic guidance with excellent purchase. We did not need to place  any additional dome screws. A 32 mm neutral + 4 marathon liner was then  placed into the acetabular shell.       The femoral lift was then placed along the lateral aspect of the femur  just distal to the vastus ridge. The leg was  externally rotated and capsule  was stripped off the inferior aspect of the femoral neck down to the  level of the lesser trochanter, this was done with electrocautery. The femur was lifted after this was performed. The  leg was then placed in an extended and adducted position essentially delivering the femur. We also removed the capsule superiorly and the piriformis from the piriformis  fossa to gain excellent exposure of the  proximal femur. Rongeur was used to remove some cancellous bone to get  into the lateral portion of the proximal femur for placement of the  initial starter reamer. The starter broaches was placed  the starter broach  and was shown to go down the center of the canal. Broaching  with the Actis system was then performed starting at size 0  coursing  Up to size 9. A size 9 had excellent torsional and rotational  and axial stability. The trial high offset neck was then placed   with a 32 + 5 trial head. The hip was then reduced. We confirmed that  the stem was in the canal both on AP and lateral x-rays. It also has excellent sizing. The hip was reduced with outstanding stability through full extension and full external rotation.. AP pelvis was taken and the leg lengths were measured and found to be equal. Hip was then dislocated again and the femoral head and neck removed. The  femoral broach was removed. Size 9 Actis stem with a high offset  neck was then impacted into the femur following native anteversion. Has  excellent purchase in the canal. Excellent torsional and rotational and  axial stability. It is confirmed to be in the canal on AP and lateral  fluoroscopic views. The 32 + 5  ceramic head was placed and the hip  reduced with outstanding stability. Again AP pelvis was taken and it  confirmed that the leg lengths were equal. The wound was then copiously  irrigated with saline solution and the capsule reattached and repaired  with Ethibond suture. 30 ml of .25% Bupivicaine was  injected into the capsule and into the edge of the tensor fascia lata as well as subcutaneous tissue. The fascia overlying the tensor fascia lata was then closed with a running #1 V-Loc. Subcu was closed with interrupted 2-0 Vicryl and subcuticular running 4-0 Monocryl. Incision was cleaned  and dried. Steri-Strips and a bulky sterile dressing applied. Hemovac  drain was hooked to suction and then the patient was awakened and transported to  recovery in stable condition.        Please note that a surgical assistant was a medical necessity for this procedure to perform it in a safe and expeditious manner. Assistant was necessary to provide appropriate retraction of vital neurovascular structures and to prevent femoral fracture and allow for anatomic placement of the prosthesis.  Gaynelle Arabian, M.D.

## 2020-01-29 ENCOUNTER — Encounter (HOSPITAL_COMMUNITY): Payer: Self-pay | Admitting: Orthopedic Surgery

## 2020-01-29 DIAGNOSIS — M1612 Unilateral primary osteoarthritis, left hip: Secondary | ICD-10-CM | POA: Diagnosis not present

## 2020-01-29 LAB — CBC
HCT: 39.1 % (ref 39.0–52.0)
Hemoglobin: 12.9 g/dL — ABNORMAL LOW (ref 13.0–17.0)
MCH: 30.4 pg (ref 26.0–34.0)
MCHC: 33 g/dL (ref 30.0–36.0)
MCV: 92.2 fL (ref 80.0–100.0)
Platelets: 185 10*3/uL (ref 150–400)
RBC: 4.24 MIL/uL (ref 4.22–5.81)
RDW: 14.3 % (ref 11.5–15.5)
WBC: 12.1 10*3/uL — ABNORMAL HIGH (ref 4.0–10.5)
nRBC: 0 % (ref 0.0–0.2)

## 2020-01-29 LAB — BASIC METABOLIC PANEL
Anion gap: 12 (ref 5–15)
BUN: 17 mg/dL (ref 8–23)
CO2: 25 mmol/L (ref 22–32)
Calcium: 8.3 mg/dL — ABNORMAL LOW (ref 8.9–10.3)
Chloride: 100 mmol/L (ref 98–111)
Creatinine, Ser: 0.57 mg/dL — ABNORMAL LOW (ref 0.61–1.24)
GFR calc Af Amer: >60 mL/min (ref >60)
GFR calc non Af Amer: >60 mL/min (ref >60)
Glucose, Bld: 144 mg/dL — ABNORMAL HIGH (ref 70–99)
Potassium: 3.7 mmol/L (ref 3.5–5.1)
Sodium: 137 mmol/L (ref 135–145)

## 2020-01-29 MED ORDER — TRAMADOL HCL 50 MG PO TABS: 50.0000 mg | ORAL_TABLET | Freq: Four times a day (QID) | ORAL | 0 refills | Status: DC | PRN

## 2020-01-29 MED ORDER — RIVAROXABAN 10 MG PO TABS: 10.0000 mg | ORAL_TABLET | Freq: Every day | ORAL | 0 refills | Status: DC

## 2020-01-29 MED ORDER — METHOCARBAMOL 500 MG PO TABS: 500.0000 mg | ORAL_TABLET | Freq: Four times a day (QID) | ORAL | 0 refills | Status: DC | PRN

## 2020-01-29 MED ORDER — HYDROCODONE-ACETAMINOPHEN 5-325 MG PO TABS: 1.0000 | ORAL_TABLET | Freq: Four times a day (QID) | ORAL | 0 refills | Status: DC | PRN

## 2020-01-29 NOTE — Progress Notes (Signed)
Met briefly with pt and confirming plan for HEP and he has all DME needed.  No TOC needs.  Sela Falk, LCSW

## 2020-01-29 NOTE — Evaluation (Signed)
Physical Therapy Evaluation Patient Details Name: Brandon [name] MRN: [id number] DOB: 07/18/1946 Today's Date: 01/29/2020   History of Present Illness  Pt is a 74 year old male s/p Left THA  Clinical Impression  Pt is s/p THA resulting in the deficits listed below (see PT Problem List).  Pt will benefit from skilled PT to increase their independence and safety with mobility to allow discharge to the venue listed below.  Pt assisted with ambulating and practiced one step technique.  Pt to d/c home later today.  Will return to practice exercises prior to d/c.     Follow Up Recommendations Follow surgeon's recommendation for DC plan and follow-up therapies    Equipment Recommendations  Rolling walker with 5" wheels    Recommendations for Other Services       Precautions / Restrictions Precautions Precautions: None Restrictions Weight Bearing Restrictions: No      Mobility  Bed Mobility Overal bed mobility: Needs Assistance Bed Mobility: Supine to Sit     Supine to sit: Supervision;HOB elevated     General bed mobility comments: increased time  Transfers Overall transfer level: Needs assistance Equipment used: Rolling walker (2 wheeled) Transfers: Sit to/from Stand Sit to Stand: Min guard         General transfer comment: verbal cues for UE and LE positioning  Ambulation/Gait Ambulation/Gait assistance: Min guard Gait Distance (Feet): 280 Feet Assistive device: Rolling walker (2 wheeled) Gait Pattern/deviations: Step-through pattern;Decreased stride length     General Gait Details: verbal cues initially for sequence however pt progressed to step through pattern  Stairs Stairs: Yes Stairs assistance: Min guard Stair Management: Forwards;Step to pattern;With walker Number of Stairs: 1 General stair comments: verbal cues for sequence, RW positioning, safety; performed twice  Wheelchair Mobility    Modified Rankin (Stroke Patients Only)        Balance                                             Pertinent Vitals/Pain Pain Assessment: 0-10 Pain Score: 3  Pain Location: left hip Pain Descriptors / Indicators: Aching;Sore Pain Intervention(s): Repositioned;Monitored during session;Premedicated before session;Ice applied    Home Living Family/patient expects to be discharged to:: Private residence Living Arrangements: Spouse/significant other   Type of Home: House Home Access: Stairs to enter   Technical brewer of Steps: 1 Home Layout: One level Home Equipment: None      Prior Function Level of Independence: Independent               Hand Dominance        Extremity/Trunk Assessment        Lower Extremity Assessment Lower Extremity Assessment: LLE deficits/detail LLE Deficits / Details: anticipated post op hip weakness observed       Communication   Communication: HOH  Cognition Arousal/Alertness: Awake/alert Behavior During Therapy: WFL for tasks assessed/performed Overall Cognitive Status: Within Functional Limits for tasks assessed                                        General Comments      Exercises     Assessment/Plan    PT Assessment Patient needs continued PT services  PT Problem List Decreased strength;Decreased mobility;Decreased knowledge of use of DME;Pain  PT Treatment Interventions Functional mobility training;Stair training;Balance training;Gait training;Therapeutic exercise;DME instruction;Therapeutic activities;Patient/family education    PT Goals (Current goals can be found in the Care Plan section)  Acute Rehab PT Goals PT Goal Formulation: With patient Time For Goal Achievement: 02/02/20 Potential to Achieve Goals: Good    Frequency 7X/week   Barriers to discharge        Co-evaluation               AM-PAC PT "6 Clicks" Mobility  Outcome Measure Help needed turning from your back to your side while in a  flat bed without using bedrails?: None Help needed moving from lying on your back to sitting on the side of a flat bed without using bedrails?: None Help needed moving to and from a bed to a chair (including a wheelchair)?: A Little Help needed standing up from a chair using your arms (e.g., wheelchair or bedside chair)?: A Little Help needed to walk in hospital room?: A Little Help needed climbing 3-5 steps with a railing? : A Little 6 Click Score: 20    End of Session Equipment Utilized During Treatment: Gait belt Activity Tolerance: Patient tolerated treatment well Patient left: in chair;with call bell/phone within reach Nurse Communication: Mobility status PT Visit Diagnosis: Other abnormalities of gait and mobility (R26.89)    Time: 9449-6759 PT Time Calculation (min) (ACUTE ONLY): 14 min   Charges:   PT Evaluation $PT Eval Low Complexity: 1 Low     Kati PT, DPT Acute Rehabilitation Services Pager: [phone] Office: [phone]  York Ram E 01/29/2020, 1:34 PM

## 2020-01-29 NOTE — Progress Notes (Signed)
Physical Therapy Treatment Patient Details Name: Brandon [name] MRN: [id number] DOB: 1946/01/29 Today's Date: 01/29/2020    History of Present Illness Pt is a 74 year old male s/p Left THA    PT Comments    Pt very HOH and spouse present this session and able to hear exercises instructions and answers to questions.  Pt ambulated and performed exercises with HEP handout.  Pt feels ready to d/c home today.   Follow Up Recommendations  Follow surgeon's recommendation for DC plan and follow-up therapies     Equipment Recommendations  Rolling walker with 5" wheels    Recommendations for Other Services       Precautions / Restrictions Precautions Precautions: None Restrictions Weight Bearing Restrictions: No    Mobility  Bed Mobility Overal bed mobility: Needs Assistance Bed Mobility: Supine to Sit     Supine to sit: Supervision;HOB elevated     General bed mobility comments: pt in recliner  Transfers Overall transfer level: Needs assistance Equipment used: Rolling walker (2 wheeled) Transfers: Sit to/from Stand Sit to Stand: Min guard         General transfer comment: verbal cues for UE and LE positioning  Ambulation/Gait Ambulation/Gait assistance: Min guard Gait Distance (Feet): 200 Feet Assistive device: Rolling walker (2 wheeled) Gait Pattern/deviations: Step-through pattern;Decreased stride length     General Gait Details: verbal cues for RW positioning   Stairs Stairs: Yes Stairs assistance: Min guard Stair Management: Forwards;Step to pattern;With walker Number of Stairs: 1 General stair comments: verbal cues for sequence, RW positioning, safety; performed twice   Wheelchair Mobility    Modified Rankin (Stroke Patients Only)       Balance                                            Cognition Arousal/Alertness: Awake/alert Behavior During Therapy: WFL for tasks assessed/performed Overall Cognitive Status: Within  Functional Limits for tasks assessed                                        Exercises Total Joint Exercises Ankle Circles/Pumps: AROM;Both;10 reps Quad Sets: AROM;Left;10 reps Short Arc Quad: AROM;Left;10 reps Heel Slides: AAROM;Left;10 reps Hip ABduction/ADduction: AROM;Left;10 reps;Supine;Standing Long Arc Quad: AROM;Left;Seated;10 reps Knee Flexion: AROM;Left;Standing;10 reps Marching in Standing: AROM;Left;Standing;10 reps Standing Hip Extension: AROM;Left;10 reps;Standing (all standing exercises performed with UE support)    General Comments        Pertinent Vitals/Pain Pain Assessment: 0-10 Pain Score: 3  Pain Location: left hip Pain Descriptors / Indicators: Aching;Sore Pain Intervention(s): Monitored during session;Repositioned    Home Living Family/patient expects to be discharged to:: Private residence Living Arrangements: Spouse/significant other   Type of Home: House Home Access: Stairs to enter   Home Layout: One level Home Equipment: None      Prior Function Level of Independence: Independent          PT Goals (current goals can now be found in the care plan section) Acute Rehab PT Goals PT Goal Formulation: With patient Time For Goal Achievement: 02/02/20 Potential to Achieve Goals: Good Progress towards PT goals: Progressing toward goals    Frequency    7X/week      PT Plan Current plan remains appropriate    Co-evaluation  AM-PAC PT "6 Clicks" Mobility   Outcome Measure  Help needed turning from your back to your side while in a flat bed without using bedrails?: None Help needed moving from lying on your back to sitting on the side of a flat bed without using bedrails?: None Help needed moving to and from a bed to a chair (including a wheelchair)?: A Little Help needed standing up from a chair using your arms (e.g., wheelchair or bedside chair)?: A Little Help needed to walk in hospital room?: A  Little Help needed climbing 3-5 steps with a railing? : A Little 6 Click Score: 20    End of Session Equipment Utilized During Treatment: Gait belt Activity Tolerance: Patient tolerated treatment well Patient left: in chair;with call bell/phone within reach;with family/visitor present Nurse Communication: Mobility status PT Visit Diagnosis: Other abnormalities of gait and mobility (R26.89)     Time: 5277-8242 PT Time Calculation (min) (ACUTE ONLY): 15 min  Charges:  $Therapeutic Exercise: 8-22 mins                    Arlyce Dice, DPT Acute Rehabilitation Services Pager: [phone] Office: [phone]  York Ram E 01/29/2020, 1:42 PM

## 2020-01-29 NOTE — Progress Notes (Signed)
Subjective: 1 Day Post-Op Procedure(s) (LRB): TOTAL HIP ARTHROPLASTY ANTERIOR APPROACH (Left) Patient reports pain as mild.   Patient seen in rounds by Dr. Wynelle Link. Patient is well, and has had no acute complaints or problems. No acute events overnight. Denies CP, SHOB, N/V. Voiding without difficulty, positive flatus. Patient states he is ready to go home today.  We will continue therapy today.   Objective: Vital signs in last 24 hours: Temp:  [97.7 F (36.5 C)-98.6 F (37 C)] 98.4 F (36.9 C) (06/15 0510) Pulse Rate:  [62-85] 75 (06/15 0510) Resp:  [10-21] 18 (06/15 0510) BP: (113-173)/(76-120) 140/87 (06/15 0510) SpO2:  [96 %-100 %] 98 % (06/15 0510) Weight:  [93.9 kg] 93.9 kg (06/14 2250)  Intake/Output from previous day:  Intake/Output Summary (Last 24 hours) at 01/29/2020 0721 Last data filed at 01/29/2020 0615 Gross per 24 hour  Intake 3474.75 ml  Output 4530 ml  Net -1055.25 ml     Intake/Output this shift: No intake/output data recorded.  Labs: Recent Labs    01/29/20 0332  HGB 12.9*   Recent Labs    01/29/20 0332  WBC 12.1*  RBC 4.24  HCT 39.1  PLT 185   Recent Labs    01/29/20 0332  NA 137  K 3.7  CL 100  CO2 25  BUN 17  CREATININE 0.57*  GLUCOSE 144*  CALCIUM 8.3*   No results for input(s): LABPT, INR in the last 72 hours.  Exam: General - Patient is Alert and Oriented Extremity - Neurologically intact Sensation intact distally Intact pulses distally Dorsiflexion/Plantar flexion intact Dressing - dressing C/D/I Motor Function - intact, moving foot and toes well on exam.   Past Medical History:  Diagnosis Date  . Arthritis   . Atherosclerotic heart disease of native coronary artery without angina pectoris   . Cataract   . Coronary atherosclerosis of native coronary artery    Multivessel status post CABG March 2015  . DDD (degenerative disc disease), lumbar    Dr. Carloyn Manner  . Essential hypertension, benign   . GERD  (gastroesophageal reflux disease)   . Hard of hearing   . Hemorrhoids   . History of gout   . History of kidney stones   . History of [facility] [facility] [place] spotted fever   . Hyperlipidemia, unspecified   . Hypertensive heart disease without heart failure   . Kidney stones   . Lumbago   . Lumbago   . Melena   . Mixed hyperlipidemia   . 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 Personal history of other diseases of the musculoskeletal system and connective tissue   . Plantar fascial fibromatosis   . Pleurodynia   . Renal calculus or stone 05/22/14  . Stress fracture 2021   left hip  . Unspecified abdominal pain   . Unspecified osteoarthritis, unspecified site     Assessment/Plan: 1 Day Post-Op Procedure(s) (LRB): TOTAL HIP ARTHROPLASTY ANTERIOR APPROACH (Left) Principal Problem:   OA (osteoarthritis) of hip Active Problems:   Closed displaced fracture of left femoral neck (HCC)  Estimated body mass index is 28.06 kg/m as calculated from the following:   Height as of this encounter: 6' (1.829 m).   Weight as of this encounter: 93.9 kg. Advance diet Up with therapy D/C IV fluids  DVT Prophylaxis - Xarelto Weight bearing as tolerated.  Plan is to go Home after hospital stay. Plan for discharge today following 1-2 sessions of therapy as long as he is meeting his goals. Follow up  in the office in 2 weeks.   Griffith Citron, PA-C Orthopedic Surgery [phone] 01/29/2020, 7:21 AM

## 2020-01-30 NOTE — Discharge Summary (Signed)
Physician Discharge Summary   Patient ID: Brandon [name] MRN: [id number] DOB/AGE: [birth year] 74 y.o.  Admit date: 01/28/2020 Discharge date: 01/29/2020  Primary Diagnosis: Osteoarthritis of the Left  hip  Admission Diagnoses:  Past Medical History:  Diagnosis Date  . Arthritis   . Atherosclerotic heart disease of native coronary artery without angina pectoris   . Cataract   . Coronary atherosclerosis of native coronary artery    Multivessel status post CABG March 2015  . DDD (degenerative disc disease), lumbar    Dr. Carloyn Manner  . Essential hypertension, benign   . GERD (gastroesophageal reflux disease)   . Hard of hearing   . Hemorrhoids   . History of gout   . History of kidney stones   . History of [facility] spotted fever   . Hyperlipidemia, unspecified   . Hypertensive heart disease without heart failure   . Kidney stones   . Lumbago   . Lumbago   . Melena   . Mixed hyperlipidemia   . 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 Personal history of other diseases of the musculoskeletal system and connective tissue   . Plantar fascial fibromatosis   . Pleurodynia   . Renal calculus or stone 05/22/14  . Stress fracture 2021   left hip  . Unspecified abdominal pain   . Unspecified osteoarthritis, unspecified site    Discharge Diagnoses:   Principal Problem:   OA (osteoarthritis) of hip Active Problems:   Closed displaced fracture of left femoral neck (HCC)  Estimated body mass index is 28.06 kg/m as calculated from the following:   Height as of this encounter: 6' (1.829 m).   Weight as of this encounter: 93.9 kg.  Procedure:  Procedure(s) (LRB): TOTAL HIP ARTHROPLASTY ANTERIOR APPROACH (Left)   Consults: None  HPI: Brandon [name], 74 y.o. male, has a history of pain and functional disability in the left hip due to prolonged steroid treatment and patient has failed non-surgical conservative treatments for greater than 12 weeks to include NSAID's and/or analgesics and  activity modification. Onset of symptoms was abrupt. The patient noted no past surgery on the left hip. Patient currently rates pain in the left hip at 10 out of 10 with activity. Patient has night pain, worsening of pain with activity and weight bearing, pain that interfers with activities of daily living and pain with passive range of motion. Patient has evidence of his femoral head is collapsed. In comparison to his MRI from several weeks ago, this fracture is in the femoral head. He has collapse of over 50 percent of the head. This is an acute finding by imaging studies. This condition presents safety issues increasing the risk of falls. There is no current active infection   Laboratory Data: Admission on 01/28/2020, Discharged on 01/29/2020  Component Date Value Ref Range Status  . WBC 01/29/2020 12.1* 4.0 - 10.5 K/uL Final  . RBC 01/29/2020 4.24  4.22 - 5.81 MIL/uL Final  . Hemoglobin 01/29/2020 12.9* 13.0 - 17.0 g/dL Final  . HCT 01/29/2020 39.1  39 - 52 % Final  . MCV 01/29/2020 92.2  80.0 - 100.0 fL Final  . MCH 01/29/2020 30.4  26.0 - 34.0 pg Final  . MCHC 01/29/2020 33.0  30.0 - 36.0 g/dL Final  . RDW 01/29/2020 14.3  11.5 - 15.5 % Final  . Platelets 01/29/2020 185  150 - 400 K/uL Final  . nRBC 01/29/2020 0.0  0.0 - 0.2 % Final   Performed at [facility], [place] [address], [place], [place] [address]  . Sodium 01/29/2020 137  135 - 145 mmol/L Final  . Potassium 01/29/2020 3.7  3.5 - 5.1 mmol/L Final  . Chloride 01/29/2020 100  98 - 111 mmol/L Final  . CO2 01/29/2020 25  22 - 32 mmol/L Final  . Glucose, Bld 01/29/2020 144* 70 - 99 mg/dL Final   Glucose reference range applies only to samples taken after fasting for at least 8 hours.  . BUN 01/29/2020 17  8 - 23 mg/dL Final  . Creatinine, Ser 01/29/2020 0.57* 0.61 - 1.24 mg/dL Final  . Calcium 01/29/2020 8.3* 8.9 - 10.3 mg/dL Final  . GFR calc non Af Amer 01/29/2020 >60  >60 mL/min Final  . GFR calc Af Amer  01/29/2020 >60  >60 mL/min Final  . Anion gap 01/29/2020 12  5 - 15 Final   Performed at [facility], [place] [address], [place], [place] [address]  Hospital Outpatient Visit on 01/24/2020  Component Date Value Ref Range Status  . MRSA, PCR 01/24/2020 POSITIVE* NEGATIVE Final   Comment: RESULT CALLED TO, READ BACK BY AND VERIFIED WITH: WASHINGTON, V. RN @0950  ON 6.11.2021 BY NMCCOY   . Staphylococcus aureus 01/24/2020 POSITIVE* NEGATIVE Final   Comment: (NOTE) The Xpert SA Assay (FDA approved for NASAL specimens in patients 75 years of age and older), is one component of a comprehensive surveillance program. It is not intended to diagnose infection nor to guide or monitor treatment. Performed at [facility], [place] [address], [place], [place] [address]   . aPTT 01/24/2020 24  24 - 36 seconds Final   Performed at [facility], [place] [address], [place], [place] [address]  . WBC 01/24/2020 10.1  4.0 - 10.5 K/uL Final  . RBC 01/24/2020 4.86  4.22 - 5.81 MIL/uL Final  . Hemoglobin 01/24/2020 15.1  13.0 - 17.0 g/dL Final  . HCT 01/24/2020 45.4  39 - 52 % Final  . MCV 01/24/2020 93.4  80.0 - 100.0 fL Final  . MCH 01/24/2020 31.1  26.0 - 34.0 pg Final  . MCHC 01/24/2020 33.3  30.0 - 36.0 g/dL Final  . RDW 01/24/2020 14.4  11.5 - 15.5 % Final  . Platelets 01/24/2020 214  150 - 400 K/uL Final  . nRBC 01/24/2020 0.0  0.0 - 0.2 % Final   Performed at [facility], [place] [address], [place],  [address]  . Sodium 01/24/2020 136  135 - 145 mmol/L Final  . Potassium 01/24/2020 4.2  3.5 - 5.1 mmol/L Final  . Chloride 01/24/2020 97* 98 - 111 mmol/L Final  . CO2 01/24/2020 27  22 - 32 mmol/L Final  . Glucose, Bld 01/24/2020 155* 70 - 99 mg/dL Final   Glucose reference range applies only to samples taken after fasting for at least 8 hours.  . BUN 01/24/2020 27* 8 - 23 mg/dL Final  . Creatinine, Ser 01/24/2020 0.85  0.61 -  1.24 mg/dL Final  . Calcium 01/24/2020 9.2  8.9 - 10.3 mg/dL Final  . Total Protein 01/24/2020 7.1  6.5 - 8.1 g/dL Final  . Albumin 01/24/2020 3.8  3.5 - 5.0 g/dL Final  . AST 01/24/2020 22  15 - 41 U/L Final  . ALT 01/24/2020 29  0 - 44 U/L Final  . Alkaline Phosphatase 01/24/2020 75  38 - 126 U/L Final  . Total Bilirubin 01/24/2020 0.9  0.3 - 1.2 mg/dL Final  . GFR calc non  Af Amer 01/24/2020 >60  >60 mL/min Final  . GFR calc Af Amer 01/24/2020 >60  >60 mL/min Final  . Anion gap 01/24/2020 12  5 - 15 Final   Performed at [facility], [place] [address], [place], [place] [address]  . Prothrombin Time 01/24/2020 11.7  11.4 - 15.2 seconds Final  . INR 01/24/2020 0.9  0.8 - 1.2 Final   Comment: (NOTE) INR goal varies based on device and disease states. Performed at [facility], [place] [address] [place], [place] [address]   . ABO/RH(D) 01/24/2020 O POS   Final  . Antibody Screen 01/24/2020 NEG   Final  . Sample Expiration 01/24/2020 01/31/2020,2359   Final  . Extend sample reason 01/24/2020    Final                   Value:NO TRANSFUSIONS OR PREGNANCY IN THE PAST 3 MONTHS Performed at [facility], [place] [address] [place], [place] [address]   . ABO/RH(D) 01/24/2020    Final                   Value:O POS Performed at [facility], [place] [address] [place], [place] [address]   Hospital Outpatient Visit on 01/24/2020  Component Date Value Ref Range Status  . SARS Coronavirus 2 01/24/2020 NEGATIVE  NEGATIVE Final   Comment: (NOTE) SARS-CoV-2 target nucleic acids are NOT DETECTED.  The SARS-CoV-2 RNA is generally detectable in upper and lower respiratory specimens during the acute phase of infection. Negative results do not preclude SARS-CoV-2 infection, do not rule out co-infections with other pathogens, and should not be used as the sole basis for treatment or other patient management decisions. Negative  results must be combined with clinical observations, patient history, and epidemiological information. The expected result is Negative.  Fact Sheet for Patients: SugarRoll.be  Fact Sheet for Healthcare Providers: https://www.woods-mathews.com/  This test is not yet approved or cleared by the [place] FDA and  has been authorized for detection and/or diagnosis of SARS-CoV-2 by FDA under an Emergency Use Authorization (EUA). This EUA will remain  in effect (meaning this test can be used) for the duration of the COVID-19 declaration under Se                          ction 564(b)(1) of the Act, 21 U.S.C. section 360bbb-3(b)(1), unless the authorization is terminated or revoked sooner.  Performed at [facility], [place] [address], [place], [place] [address]      X-Rays:DG Pelvis Portable  Result Date: 01/28/2020 CLINICAL DATA:  Postop EXAM: PORTABLE PELVIS 1-2 VIEWS COMPARISON:  01/28/2020, CT 04/12/2018 FINDINGS: Hardware in the lumbosacral spine. Pubic symphysis and rami appear intact. Mild degenerative change of the right hip. Vascular calcifications. Status post left hip replacement with intact hardware and normal alignment. Gas in the soft tissues consistent with recent surgery. IMPRESSION: Status post left hip replacement with expected postsurgical changes. Electronically Signed   By: Donavan Foil M.D.   On: 01/28/2020 17:20   DG C-Arm 1-60 Min-No Report  Result Date: 01/28/2020 Fluoroscopy was utilized by the requesting physician.  No radiographic interpretation.   DG HIP OPERATIVE UNILAT W OR W/O PELVIS LEFT  Result Date: 01/28/2020 CLINICAL DATA:  Status post total hip replacement EXAM: OPERATIVE LEFT HIP (WITH PELVIS IF PERFORMED) 1 VIEW TECHNIQUE: Fluoroscopic spot image(s) were submitted for interpretation post-operatively. COMPARISON:  None.  FLUOROSCOPY TIME:  0 MINUTES 9 SECONDS; 0 minutes 9 seconds; 8 acquired images  FINDINGS: A series of images were obtained showing placement of total hip prosthesis on the left. On final submitted image, left total hip prostheses appear well seated. No fracture or dislocation evident. There is mild narrowing of the right hip joint. Multiple prostatic calculi noted. IMPRESSION: Total hip replacement on the left with prosthetic components well-seated. Mild narrowing right hip joint. No fracture. No dislocation. Prostatic calculi noted. Electronically Signed   By: Lowella Grip III M.D.   On: 01/28/2020 16:13    EKG: Orders placed or performed in visit on 01/25/20  . EKG 12-Lead     Hospital Course: Brandon [name] is a 74 y.o. who was admitted to [facility] They were brought to the operating room on 01/28/2020 and underwent Procedure(s): [place].  Patient tolerated the procedure well and was later transferred to the recovery room and then to the orthopaedic floor for postoperative care. They were given PO and IV analgesics for pain control following their surgery. They were given 24 hours of postoperative antibiotics of  Anti-infectives (From admission, onward)   Start     Dose/Rate Route Frequency Ordered Stop   01/28/20 2100  ceFAZolin (ANCEF) IVPB 2g/100 mL premix        2 g 200 mL/hr over 30 Minutes Intravenous Every 6 hours 01/28/20 1817 01/29/20 0536   01/28/20 1245  ceFAZolin (ANCEF) IVPB 2g/100 mL premix        2 g 200 mL/hr over 30 Minutes Intravenous On call to O.R. 01/28/20 1219 01/28/20 1439   01/28/20 1245  vancomycin (VANCOCIN) IVPB 1000 mg/200 mL premix        1,000 mg 200 mL/hr over 60 Minutes Intravenous On call to O.R. 01/28/20 1219 01/28/20 1509     and started on DVT prophylaxis in the form of Xarelto.   PT and OT were ordered for total joint protocol. Discharge planning consulted to help with postop disposition and equipment needs.  Patient had a good night on the evening of surgery. They started to get  up OOB with therapy on POD #1. Pt was seen during rounds and was ready to go home pending progress with therapy.  He worked with therapy on POD #1 and was meeting his goals. Pt was discharged to home later that day in stable condition.  Diet: Regular diet Activity: WBAT Follow-up: in 2 weeks Disposition: Home Discharged Condition: good   Discharge Instructions    Call MD / Call 911   Complete by: As directed    If you experience chest pain or shortness of breath, CALL 911 and be transported to the hospital emergency room.  If you develope a fever above 101 F, pus (white drainage) or increased drainage or redness at the wound, or calf pain, call your surgeon's office.   Change dressing   Complete by: As directed    You have an adhesive waterproof bandage over the incision. Leave this in place until your first follow-up appointment. Once you remove this you will not need to place another bandage.   Constipation Prevention   Complete by: As directed    Drink plenty of fluids.  Prune juice may be helpful.  You may use a stool softener, such as Colace (over the counter) 100 mg twice a day.  Use MiraLax (over the counter) for constipation as needed.   Diet - low sodium heart healthy   Complete  by: As directed    Do not sit on low chairs, stoools or toilet seats, as it may be difficult to get up from low surfaces   Complete by: As directed    Driving restrictions   Complete by: As directed    No driving for two weeks   TED hose   Complete by: As directed    Use stockings (TED hose) for three weeks on both leg(s).  You may remove them at night for sleeping.   Weight bearing as tolerated   Complete by: As directed      Allergies as of 01/29/2020   No Known Allergies     Medication List    STOP taking these medications   acetaminophen 650 MG CR tablet Commonly known as: TYLENOL   aspirin EC 81 MG tablet   CALCIUM 600/VITAMIN D3 PO   Fish Oil 500 MG Caps   multivitamin with  minerals Tabs tablet   OSTEO BI-FLEX REGULAR STRENGTH PO     TAKE these medications   allopurinol 300 MG tablet Commonly known as: ZYLOPRIM Take 300 mg by mouth daily.   amLODipine 5 MG tablet Commonly known as: NORVASC Take 1 tablet (5 mg total) by mouth daily.   hydrochlorothiazide 50 MG tablet Commonly known as: HYDRODIURIL Take 50 mg by mouth daily.   HYDROcodone-acetaminophen 5-325 MG tablet Commonly known as: NORCO/VICODIN Take 1-2 tablets by mouth every 6 (six) hours as needed for severe pain.   methocarbamol 500 MG tablet Commonly known as: ROBAXIN Take 1 tablet (500 mg total) by mouth every 6 (six) hours as needed for muscle spasms.   nitroGLYCERIN 0.4 MG SL tablet Commonly known as: NITROSTAT Place 1 tablet (0.4 mg total) under the tongue every 5 (five) minutes as needed.   pantoprazole 40 MG tablet Commonly known as: Protonix Take 1 tablet (40 mg total) by mouth daily.   potassium chloride 10 MEQ tablet Commonly known as: KLOR-CON Take 10 mEq by mouth daily.   pravastatin 40 MG tablet Commonly known as: PRAVACHOL Take 40 mg by mouth every evening.   predniSONE 20 MG tablet Commonly known as: Deltasone Take 3 tablets (60 mg total) by mouth daily.   pyridostigmine 60 MG tablet Commonly known as: MESTINON Take 1 tablet (60 mg total) by mouth 3 (three) times daily.   rivaroxaban 10 MG Tabs tablet Commonly known as: XARELTO Take 1 tablet (10 mg total) by mouth daily with breakfast for 20 days. Then resume Aspirin 81 mg daily.   tamsulosin 0.4 MG Caps capsule Commonly known as: FLOMAX Take 0.4 mg by mouth 2 (two) times daily.   traMADol 50 MG tablet Commonly known as: ULTRAM Take 1-2 tablets (50-100 mg total) by mouth every 6 (six) hours as needed for moderate pain.            Discharge Care Instructions  (From admission, onward)         Start     Ordered   01/29/20 0000  Weight bearing as tolerated        01/29/20 0726   01/29/20 0000   Change dressing       Comments: You have an adhesive waterproof bandage over the incision. Leave this in place until your first follow-up appointment. Once you remove this you will not need to place another bandage.   01/29/20 0726          Follow-up Information    Gaynelle Arabian, MD. Schedule an appointment as soon as possible for  a visit on 02/12/2020.   Specialty: Orthopedic Surgery Contact information: [address] [place] [address] [phone]               Signed: Griffith Citron, PA-C Orthopedic Surgery 01/30/2020, 8:39 AM

## 2020-02-06 ENCOUNTER — Ambulatory Visit: Payer: Medicare Other | Admitting: Adult Health

## 2020-02-11 ENCOUNTER — Telehealth: Payer: Self-pay | Admitting: Diagnostic Neuroimaging

## 2020-02-11 NOTE — Telephone Encounter (Addendum)
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 He was told he has to get  letter from neurologist. He has appointment tomorrow in [place], would like to pick up letter in late afternoon. I advised will let MD know.  Patient verbalized understanding, appreciation.

## 2020-02-11 NOTE — Telephone Encounter (Signed)
I am not comfortable to write letter. Recommend to continue MG treatments. -VRP

## 2020-02-11 NOTE — Telephone Encounter (Signed)
Defer to PCP. -VRP

## 2020-02-11 NOTE — Telephone Encounter (Signed)
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

## 2020-02-12 NOTE — Telephone Encounter (Signed)
Should not cause kidney pain. -VRP

## 2020-02-12 NOTE — Telephone Encounter (Signed)
Attempted to reach patient, no answer, no VM.

## 2020-02-12 NOTE — Telephone Encounter (Signed)
Pt's wife, Shauna Hugh, per DPR called Verneita Griffes back. I advised her that Dr. [name] does not feel comfortable writing the requested letter and recommends that he discuss this letter with his PCP.   Pt's wife says that his kidneys have been hurting him and wants to know if this could be related to prednisone or mestinon. She would like a call back.

## 2020-02-12 NOTE — Telephone Encounter (Signed)
Spoke with wife, advised the mestinon and prednisone should not cause kidney pain. Wife has called PCP for him to be seen because he has history of kidney stones. She verbalized understanding, appreciation.

## 2020-02-28 DIAGNOSIS — M25552 Pain in left hip: Secondary | ICD-10-CM | POA: Diagnosis not present

## 2020-02-28 DIAGNOSIS — Z96642 Presence of left artificial hip joint: Secondary | ICD-10-CM | POA: Diagnosis not present

## 2020-02-28 DIAGNOSIS — M25452 Effusion, left hip: Secondary | ICD-10-CM | POA: Diagnosis not present

## 2020-03-10 NOTE — Addendum Note (Signed)
Addended by: Minna Antis on: 03/10/2020 12:52 PM   Modules accepted: Orders

## 2020-03-10 NOTE — Telephone Encounter (Addendum)
Called wife, [name] who stated Dr Maureen Ralphs found a second stress fracture after hip surgery in June. Patient has FU with him Aug 4th.  Wife put patient on the phone. He reported being nauseated, his body quivers, loss of appetite, bloated, generally not feeling well,  had to get OTC for constipation but was taking hydrocodone at that time.  Wife asking if prednisone or pyridostigmine doses need to be adjusted. She requested sooner FU, moved FU to Aug 3rd. I advised will send message to Dr [name]. Wife verbalized understanding, appreciation.

## 2020-03-10 NOTE — Telephone Encounter (Signed)
Discussed with Dr [name], called wife and advised Dr [name] feels he should be evaluated by PCP for possible infection due to recent symptoms. Prednisone may cause ulcers, susceptibility to infection, typically causes increased appetite. She will call PCP,  verbalized understanding, appreciation.

## 2020-03-10 NOTE — Telephone Encounter (Signed)
Pt's wife, [place]us [place] (on [place]) stated, would like nurse to know predniSONE (DELTASONE) 20 MG tablet is causing nausea,  Loose bowel, and feels bad. Has had a stress fraction Prednisone may have caused bones weakness. Wife would like nurse to call her.

## 2020-03-11 DIAGNOSIS — I2510 Atherosclerotic heart disease of native coronary artery without angina pectoris: Secondary | ICD-10-CM | POA: Diagnosis not present

## 2020-03-11 DIAGNOSIS — M1991 Primary osteoarthritis, unspecified site: Secondary | ICD-10-CM | POA: Diagnosis not present

## 2020-03-11 DIAGNOSIS — Z6828 Body mass index (BMI) 28.0-28.9, adult: Secondary | ICD-10-CM | POA: Diagnosis not present

## 2020-03-11 DIAGNOSIS — G7000 Myasthenia gravis without (acute) exacerbation: Secondary | ICD-10-CM | POA: Diagnosis not present

## 2020-03-11 DIAGNOSIS — E663 Overweight: Secondary | ICD-10-CM | POA: Diagnosis not present

## 2020-03-11 DIAGNOSIS — S79912D Unspecified injury of left hip, subsequent encounter: Secondary | ICD-10-CM | POA: Diagnosis not present

## 2020-03-18 ENCOUNTER — Ambulatory Visit (INDEPENDENT_AMBULATORY_CARE_PROVIDER_SITE_OTHER): Payer: Medicare Other | Admitting: Diagnostic Neuroimaging

## 2020-03-18 ENCOUNTER — Encounter: Payer: Self-pay | Admitting: Diagnostic Neuroimaging

## 2020-03-18 VITALS — BP 113/69 | HR 49 | Ht 73.0 in | Wt 202.8 lb

## 2020-03-18 DIAGNOSIS — G7000 Myasthenia gravis without (acute) exacerbation: Secondary | ICD-10-CM

## 2020-03-18 DIAGNOSIS — I25119 Atherosclerotic heart disease of native coronary artery with unspecified angina pectoris: Secondary | ICD-10-CM | POA: Diagnosis not present

## 2020-03-18 MED ORDER — PREDNISONE 10 MG PO TABS: ORAL_TABLET | ORAL | 12 refills | Status: DC

## 2020-03-18 NOTE — Patient Instructions (Addendum)
GENERALIZED MYASTHENIA GRAVIS (stable) - continue pyridostigmine 60mg  three times a day  - reduce prednisone (reduce by 10mg  per day every month, until 30mg  daily, then maintain) - prednisone 50mg  daily x 1 month - prednisone 40mg  daily x 1 month - prednisone 30mg  daily --> monitor  - continue vitamin D + calcium supplement

## 2020-03-18 NOTE — Progress Notes (Signed)
[facility]  [name] DOB: May 23, 1946  REFERRING CLINICIAN: Sharilyn Sites, MD HISTORY FROM: patient and wife  REASON FOR VISIT: follow up    HISTORICAL  CHIEF COMPLAINT:  Chief Complaint  Patient presents with  . Myasthenia Gravis    rm 6, FU to discuss medications, wife- [name] "are meds causing bloating?; prednisone causes my body to quiver; my eyesight is fine now"    HISTORY OF PRESENT ILLNESS:   UPDATE (03/18/20, VRP): Since last visit, doing well from MG. Some side effects from prednisone (jittery, bloating). Symptoms are stable. Severity is mild. No alleviating or aggravating factors. Tolerating meds otherwise.   UPDATE (01/08/20, VRP): Since last visit, doing WELL. Symptoms are stable. No alleviating or aggravating factors. Tolerating meds. New left hip stress fracture since Dec 28, 2019. Using walker.   UPDATE (11/14/19, VRP): Since last visit, doing WELL; s/p IVIG in the hospital. Now on prednisone 60mg  daily. Symptoms are improved. No alleviating or aggravating factors. Tolerating meds.    UPDATE (10/31/19, VRP): Since last visit, doing poorly. Now with fatigue, SOB, hand weakness, leg weakness. Mestinon not helping (even up to 90mg  three times a day).  PRIOR HPI (09/24/19): 74 year old male here for evaluation of myasthenia gravis.  September 10, 2019 patient noticed right eye ptosis, progressing to left eye ptosis.  He was having some stomach pain, diarrhea, gas, and breathing issues around the same time.  [name], had [place] R antibody testing which was positive and diagnosed with myasthenia gravis.  Around the same time patient was with a GI doctor, diagnosed with possible bacterial infection and treated with antibiotics.  He was also noted to be heme positive stool.  Since that time I symptoms are stable.  Symptoms are worse in the evening compared the morning.  [name].  Denies any  shortness of breath.  Denies any speech or swallowing problems.  Eye drooping is severe enough that sometimes it obstructs his vision and he is not able to drive currently.   REVIEW OF SYSTEMS: Full 14 system review of systems performed and negative with exception of: as per HPI.    ALLERGIES: No Known Allergies  HOME MEDICATIONS: Outpatient Medications Prior to Visit  Medication Sig Dispense Refill  . acetaminophen (TYLENOL) 500 MG tablet Take 1,000 mg by mouth daily.    Marland Kitchen allopurinol (ZYLOPRIM) 300 MG tablet Take 300 mg by mouth daily.    Marland Kitchen amLODipine (NORVASC) 5 MG tablet Take 1 tablet (5 mg total) by mouth daily. 30 tablet 1  . aspirin EC 81 MG tablet Take 81 mg by mouth daily. Swallow whole.    . Calcium 600-200 MG-UNIT tablet Take 1 tablet by mouth daily.    . Multiple Vitamin (MULTIVITAMIN) tablet Take 1 tablet by mouth daily.    . nitroGLYCERIN (NITROSTAT) 0.4 MG SL tablet Place 1 tablet (0.4 mg total) under the tongue every 5 (five) minutes as needed. 25 tablet 3  . Omega-3 Fatty Acids (FISH OIL) 500 MG CAPS Take by mouth daily.    . pantoprazole (PROTONIX) 40 MG tablet Take 1 tablet (40 mg total) by mouth daily. 30 tablet 1  . pravastatin (PRAVACHOL) 40 MG tablet Take 40 mg by mouth every evening.     . predniSONE (DELTASONE) 20 MG tablet Take 3 tablets (60 mg total) by mouth daily. 90 tablet 12  . pyridostigmine (MESTINON) 60 MG tablet Take 1 tablet (60 mg total)  by mouth 3 (three) times daily. 90 tablet 12  . Simethicone (GAS FREE EXTRA STRENGTH PO) Take by mouth. After meals    . tamsulosin (FLOMAX) 0.4 MG CAPS capsule Take 0.4 mg by mouth 2 (two) times daily.     Marland Kitchen UNABLE TO FIND Med Name: Osteo bioflex    . hydrochlorothiazide (HYDRODIURIL) 50 MG tablet Take 50 mg by mouth daily. (Patient not taking: Reported on 03/18/2020)    . HYDROcodone-acetaminophen (NORCO/VICODIN) 5-325 MG tablet Take 1-2 tablets by mouth every 6 (six) hours as needed for severe pain. (Patient not  taking: Reported on 03/18/2020) 56 tablet 0  . methocarbamol (ROBAXIN) 500 MG tablet Take 1 tablet (500 mg total) by mouth every 6 (six) hours as needed for muscle spasms. (Patient not taking: Reported on 03/18/2020) 40 tablet 0  . potassium chloride (K-DUR,KLOR-CON) 10 MEQ tablet Take 10 mEq by mouth daily. (Patient not taking: Reported on 03/18/2020)    . traMADol (ULTRAM) 50 MG tablet Take 1-2 tablets (50-100 mg total) by mouth every 6 (six) hours as needed for moderate pain. (Patient not taking: Reported on 03/18/2020) 40 tablet 0  . rivaroxaban (XARELTO) 10 MG TABS tablet Take 1 tablet (10 mg total) by mouth daily with breakfast for 20 days. Then resume Aspirin 81 mg daily. 20 tablet 0   No facility-administered medications prior to visit.    PAST MEDICAL HISTORY: Past Medical History:  Diagnosis Date  . Arthritis   . Atherosclerotic heart disease of native coronary artery without angina pectoris   . Cataract   . Coronary atherosclerosis of native coronary artery    Multivessel status post CABG March 2015  . DDD (degenerative disc disease), lumbar    Dr. Carloyn Manner  . Essential hypertension, benign   . GERD (gastroesophageal reflux disease)   . Hard of hearing   . Hemorrhoids   . History of gout   . History of kidney stones   . History of [facility] spotted fever   . Hyperlipidemia, unspecified   . Hypertensive heart disease without heart failure   . Kidney stones   . Lumbago   . Lumbago   . Melena   . Mixed hyperlipidemia   . 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 Personal history of other diseases of the musculoskeletal system and connective tissue   . Plantar fascial fibromatosis   . Pleurodynia   . Renal calculus or stone 05/22/14  . Stress fracture 2021   left hip  . Unspecified abdominal pain   . Unspecified osteoarthritis, unspecified site     PAST SURGICAL HISTORY: Past Surgical History:  Procedure Laterality Date  . AMPUTATION Right 03/27/2014   Procedure: DEBRIDEMENT AND  CLOSURE RIGHT INDEX FINGER;  Surgeon: [name], MD;  Location: [place];  Service: Orthopedics;  Laterality: Right;  . BACK SURGERY    . BICEPT TENODESIS  11/26/2011   Procedure: BICEPT TENODESIS;  Surgeon: Augustin Schooling, MD;  Location: [place];  Service: Orthopedics;  Laterality: Left;  . BIOPSY  08/03/2018   Procedure: BIOPSY;  Surgeon: Rogene Houston, MD;  [facility];  Service: Endoscopy;;  antral  . Cataract surgery Left   . CHOLECYSTECTOMY N/A 09/22/2018   Procedure: LAPAROSCOPIC CHOLECYSTECTOMY;  Surgeon: Virl Cagey, MD;  Location: [facility];  Service: General;  Laterality: N/A;  . COLONOSCOPY    . COLONOSCOPY N/A 12/26/2014   Procedure: COLONOSCOPY;  Surgeon: Rogene Houston, MD;  [facility];  Service: Endoscopy;  Laterality: N/A;  730  . CORONARY ARTERY BYPASS GRAFT N/A 10/19/2013   Procedure: CORONARY ARTERY BYPASS GRAFTING (CABG);  Surgeon: Rexene Alberts, MD;  Location: [place];  Service: Open Heart Surgery;  Laterality: N/A;  CABG times four using left internal mammary artery and left leg saphenous vein, incision made on right leg but no vein removed  . CYSTOSCOPY WITH RETROGRADE PYELOGRAM, URETEROSCOPY AND STENT PLACEMENT Left 08/24/2016   Procedure: CYSTOSCOPY WITH LEFT RETROGRADE PYELOGRAM, LEFT URETEROSCOPY, BASKET EXTRACTION LEFT URETERAL STONE;  Surgeon: Irine Seal, MD;  [facility]: [facility];  Service: Urology;  Laterality: Left;  . ESOPHAGOGASTRODUODENOSCOPY N/A 08/03/2018   Procedure: ESOPHAGOGASTRODUODENOSCOPY (EGD);  Surgeon: Rogene Houston, MD;  [facility];  Service: Endoscopy;  Laterality: N/A;  2:55  . EXTRACORPOREAL SHOCK WAVE LITHOTRIPSY Right 06/18/2019   Procedure: EXTRACORPOREAL SHOCK WAVE LITHOTRIPSY (ESWL);  Surgeon: Lucas Mallow, MD;  [facility];  Service: Urology;  Laterality: Right;  . INTRAOPERATIVE TRANSESOPHAGEAL ECHOCARDIOGRAM N/A 10/19/2013   Procedure: INTRAOPERATIVE TRANSESOPHAGEAL ECHOCARDIOGRAM;  Surgeon:  Rexene Alberts, MD;  Location: [place];  Service: Open Heart Surgery;  Laterality: N/A;  . JOINT REPLACEMENT    . LEFT HEART CATHETERIZATION WITH CORONARY ANGIOGRAM N/A 10/09/2013   Procedure: LEFT HEART CATHETERIZATION WITH CORONARY ANGIOGRAM;  Surgeon: Burnell Blanks, MD;  Location: [facility];  Service: Cardiovascular;  Laterality: N/A;  . Left shoulder rotator cuff repair    . LITHOTRIPSY    . TOTAL HIP ARTHROPLASTY Left 01/28/2020   Procedure: TOTAL HIP ARTHROPLASTY ANTERIOR APPROACH;  Surgeon: Gaynelle Arabian, MD;  [facility];  Service: Orthopedics;  Laterality: Left;  129min  . TOTAL KNEE ARTHROPLASTY Left   . traumaticvamputation of finger  2015  . VASECTOMY      FAMILY HISTORY: Family History  Problem Relation Age of Onset  . Hypertension Sister   . Lung cancer Father   . Heart failure Mother     SOCIAL HISTORY: Social History   Socioeconomic History  . Marital status: Married    Spouse name: [name]  . Number of children: 2  . Years of education: Not on file  . Highest education level: 10th grade  Occupational History  . Occupation: lumber mills  Tobacco Use  . Smoking status: Former Smoker    Packs/day: 2.00    Years: 30.00    Pack years: 60.00    Types: Cigarettes    Start date: 08/16/1958    Quit date: 08/17/1983    Years since quitting: 36.6  . Smokeless tobacco: Former Systems developer    Types: Chew  . Tobacco comment: quit smoking 74yrs ago  Vaping Use  . Vaping Use: Never used  Substance and Sexual Activity  . Alcohol use: Yes    Alcohol/week: 0.0 standard drinks    Comment: Occassional  . Drug use: No  . Sexual activity: Yes  Other Topics Concern  . Not on file  Social History Narrative   Lives with wife   Caffeine- coffee 3- 4 c daily   Social Determinants of Health   Financial Resource Strain:   . Difficulty of Paying Living Expenses:   Food Insecurity:   . Worried About Charity fundraiser in the Last Year:   . Arboriculturist in the Last  Year:   Transportation Needs:   . Film/video editor (Medical):   Marland Kitchen Lack of Transportation (Non-Medical):   Physical Activity:   . Days of Exercise per Week:   .  Minutes of Exercise per Session:   Stress:   . Feeling of Stress :   Social Connections:   . Frequency of Communication with Friends and Family:   . Frequency of Social Gatherings with Friends and Family:   . Attends Religious Services:   . Active Member of Clubs or Organizations:   . Attends Archivist Meetings:   Marland Kitchen Marital Status:   Intimate Partner Violence:   . Fear of Current or Ex-Partner:   . Emotionally Abused:   Marland Kitchen Physically Abused:   . Sexually Abused:      PHYSICAL EXAM  GENERAL EXAM/CONSTITUTIONAL: Vitals:  Vitals:   03/18/20 0834  BP: 113/69  Pulse: (!) 49  Weight: 202 lb 12.8 oz (92 kg)  Height: 6\' 1"  (1.854 m)   Body mass index is 26.76 kg/m. Wt Readings from Last 10 Encounters:  03/18/20 202 lb 12.8 oz (92 kg)  01/28/20 206 lb 14.4 oz (93.9 kg)  01/25/20 206 lb 12.8 oz (93.8 kg)  01/24/20 207 lb 1 oz (93.9 kg)  01/24/20 207 lb (93.9 kg)  01/08/20 210 lb 6.4 oz (95.4 kg)  11/14/19 206 lb (93.4 kg)  11/12/19 205 lb 8 oz (93.2 kg)  11/08/19 202 lb (91.6 kg)  10/31/19 207 lb 6.4 oz (94.1 kg)    Patient is in no distress; well developed, nourished and groomed; neck is supple  CARDIOVASCULAR:  Examination of carotid arteries is normal; no carotid bruits  Regular rate and rhythm, no murmurs  Examination of peripheral vascular system by observation and palpation is normal  EYES:  Ophthalmoscopic exam of optic discs and posterior segments is normal; no papilledema or hemorrhages No exam data present  MUSCULOSKELETAL:  Gait, strength, tone, movements noted in Neurologic exam below  NEUROLOGIC: MENTAL STATUS:  No flowsheet data found.  awake, alert, oriented to person, place and time  recent and remote memory intact  normal attention and concentration  language  fluent, comprehension intact, naming intact  fund of knowledge appropriate  CRANIAL NERVE:   2nd - no papilledema on fundoscopic exam  2nd, 3rd, 4th, 6th - pupils equal and reactive to light, visual fields full to confrontation, extraocular muscles intact, no nystagmus  5th - facial sensation symmetric  7th - facial strength symmetric  8th - hearing --> REDUCED  9th - palate elevates symmetrically, uvula midline  11th - shoulder shrug symmetric  12th - tongue protrusion midline   MOTOR:   normal bulk and tone  BUE 5; BLE 5 EXCEPT LEFT HIP FLEXION 3 (LIMITED BY PAIN)  SENSORY:   normal and symmetric to light touch, temperature, vibration  COORDINATION:   finger-nose-finger, fine finger movements normal  REFLEXES:   deep tendon reflexes TRACE and symmetric  GAIT/STATION:   narrow based gait; USING WALKER     DIAGNOSTIC DATA (LABS, IMAGING, TESTING) - I reviewed patient records, labs, notes, testing and imaging myself where available.  Lab Results  Component Value Date   WBC 12.1 (H) 01/29/2020   HGB 12.9 (L) 01/29/2020   HCT 39.1 01/29/2020   MCV 92.2 01/29/2020   PLT 185 01/29/2020      Component Value Date/Time   NA 137 01/29/2020 0332   NA 138 06/28/2019 0000   K 3.7 01/29/2020 0332   CL 100 01/29/2020 0332   CO2 25 01/29/2020 0332   GLUCOSE 144 (H) 01/29/2020 0332   BUN 17 01/29/2020 0332   BUN 20 06/28/2019 0000   CREATININE 0.57 (L) 01/29/2020 6203  CALCIUM 8.3 (L) 01/29/2020 0332   PROT 7.1 01/24/2020 1555   ALBUMIN 3.8 01/24/2020 1555   AST 22 01/24/2020 1555   ALT 29 01/24/2020 1555   ALKPHOS 75 01/24/2020 1555   BILITOT 0.9 01/24/2020 1555   GFRNONAA >60 01/29/2020 0332   GFRAA >60 01/29/2020 0332   Lab Results  Component Value Date   CHOL 134 06/28/2019   HDL 49 06/28/2019   LDLCALC 62 06/28/2019   TRIG 157 06/28/2019   CHOLHDL 2.4 12/06/2013   Lab Results  Component Value Date   HGBA1C 5.9 (H) 10/18/2013   No  results found for: VITAMINB12 No results found for: TSH  09/17/19 AchR ab - 42 HIGH  10/15/19 CT chest 1. No thymic mass or thymoma. 2.  Aortic Atherosclerosis (ICD10-I70.0). 3. 4.5 mm left lower lobe pulmonary nodule. This is unchanged compared with 04/12/2018. No follow-up needed if patient is low-risk. Non-contrast chest CT can be considered in 12 months if patient is high-risk. This recommendation follows the consensus statement: Guidelines for Management of Incidental Pulmonary Nodules Detected on CT Images: From the Fleischner Society 2017; Radiology 2017; 284:228-243.   ASSESSMENT AND PLAN  74 y.o. year old male here with new onset ptosis, blurred vision, generalized weakness with positive ACH antibody, consistent with generalized myasthenia gravis.    Dx:  1. Myasthenia gravis without (acute) exacerbation (HCC)     PLAN:  GENERALIZED MYASTHENIA GRAVIS (stable) - continue pyridostigmine 60mg  three times a day - completed IVIG x 5 days (in hospital; March 2021) - taper prednisone (reduce by 10mg  per day every month, until 20mg  daily, then maintain); if not tolerated, then may need to start imuran or cellcept - start vitamin D + calcium supplement  Meds ordered this encounter  Medications  . predniSONE (DELTASONE) 10 MG tablet    Sig: Take as directed; prednisone tapering    Dispense:  150 tablet    Refill:  12   Return in about 6 months (around 09/18/2020).    Penni Bombard, MD 01/18/4649, 3:54 AM Certified in Neurology, Neurophysiology and Neuroimaging  [facility] [address] [place], [place] [address] [phone]

## 2020-03-19 ENCOUNTER — Inpatient Hospital Stay (HOSPITAL_COMMUNITY): Payer: Medicare Other | Admitting: Certified Registered Nurse Anesthetist

## 2020-03-19 ENCOUNTER — Inpatient Hospital Stay (HOSPITAL_COMMUNITY): Payer: Medicare Other

## 2020-03-19 ENCOUNTER — Encounter (HOSPITAL_COMMUNITY)
Admission: AD | Disposition: A | Discharge status: Home Health | Payer: Self-pay | Source: Home / Self Care | Attending: Orthopedic Surgery

## 2020-03-19 ENCOUNTER — Other Ambulatory Visit: Payer: Self-pay

## 2020-03-19 ENCOUNTER — Encounter (HOSPITAL_COMMUNITY): Payer: Self-pay | Admitting: Orthopedic Surgery

## 2020-03-19 ENCOUNTER — Inpatient Hospital Stay (HOSPITAL_COMMUNITY)
Admission: AD | Admit: 2020-03-19 | Discharge: 2020-03-22 | DRG: 467 | Disposition: A | Discharge status: Home Health | Payer: Medicare Other | Attending: Orthopedic Surgery | Admitting: Orthopedic Surgery

## 2020-03-19 DIAGNOSIS — Z951 Presence of aortocoronary bypass graft: Secondary | ICD-10-CM

## 2020-03-19 DIAGNOSIS — G7000 Myasthenia gravis without (acute) exacerbation: Secondary | ICD-10-CM | POA: Diagnosis present

## 2020-03-19 DIAGNOSIS — I2510 Atherosclerotic heart disease of native coronary artery without angina pectoris: Secondary | ICD-10-CM | POA: Diagnosis present

## 2020-03-19 DIAGNOSIS — M9702XA Periprosthetic fracture around internal prosthetic left hip joint, initial encounter: Secondary | ICD-10-CM | POA: Diagnosis not present

## 2020-03-19 DIAGNOSIS — T380X5A Adverse effect of glucocorticoids and synthetic analogues, initial encounter: Secondary | ICD-10-CM | POA: Diagnosis present

## 2020-03-19 DIAGNOSIS — T8189XA Other complications of procedures, not elsewhere classified, initial encounter: Secondary | ICD-10-CM | POA: Diagnosis not present

## 2020-03-19 DIAGNOSIS — Z79899 Other long term (current) drug therapy: Secondary | ICD-10-CM | POA: Diagnosis not present

## 2020-03-19 DIAGNOSIS — I119 Hypertensive heart disease without heart failure: Secondary | ICD-10-CM | POA: Diagnosis not present

## 2020-03-19 DIAGNOSIS — H9190 Unspecified hearing loss, unspecified ear: Secondary | ICD-10-CM | POA: Diagnosis present

## 2020-03-19 DIAGNOSIS — S7290XA Unspecified fracture of unspecified femur, initial encounter for closed fracture: Secondary | ICD-10-CM

## 2020-03-19 DIAGNOSIS — Z419 Encounter for procedure for purposes other than remedying health state, unspecified: Secondary | ICD-10-CM

## 2020-03-19 DIAGNOSIS — Z20822 Contact with and (suspected) exposure to covid-19: Secondary | ICD-10-CM | POA: Diagnosis present

## 2020-03-19 DIAGNOSIS — E782 Mixed hyperlipidemia: Secondary | ICD-10-CM | POA: Diagnosis present

## 2020-03-19 DIAGNOSIS — M5136 Other intervertebral disc degeneration, lumbar region: Secondary | ICD-10-CM | POA: Diagnosis present

## 2020-03-19 DIAGNOSIS — S72002A Fracture of unspecified part of neck of left femur, initial encounter for closed fracture: Secondary | ICD-10-CM | POA: Diagnosis present

## 2020-03-19 DIAGNOSIS — E0965 Drug or chemical induced diabetes mellitus with hyperglycemia: Secondary | ICD-10-CM | POA: Diagnosis present

## 2020-03-19 DIAGNOSIS — I10 Essential (primary) hypertension: Secondary | ICD-10-CM | POA: Diagnosis not present

## 2020-03-19 DIAGNOSIS — Z8249 Family history of ischemic heart disease and other diseases of the circulatory system: Secondary | ICD-10-CM

## 2020-03-19 DIAGNOSIS — K219 Gastro-esophageal reflux disease without esophagitis: Secondary | ICD-10-CM | POA: Diagnosis present

## 2020-03-19 DIAGNOSIS — T8189XD Other complications of procedures, not elsewhere classified, subsequent encounter: Secondary | ICD-10-CM | POA: Diagnosis not present

## 2020-03-19 DIAGNOSIS — Z89021 Acquired absence of right finger(s): Secondary | ICD-10-CM | POA: Diagnosis not present

## 2020-03-19 DIAGNOSIS — Z96649 Presence of unspecified artificial hip joint: Secondary | ICD-10-CM

## 2020-03-19 DIAGNOSIS — S72302A Unspecified fracture of shaft of left femur, initial encounter for closed fracture: Secondary | ICD-10-CM | POA: Diagnosis not present

## 2020-03-19 DIAGNOSIS — Z471 Aftercare following joint replacement surgery: Secondary | ICD-10-CM | POA: Diagnosis not present

## 2020-03-19 DIAGNOSIS — Z7952 Long term (current) use of systemic steroids: Secondary | ICD-10-CM | POA: Diagnosis not present

## 2020-03-19 DIAGNOSIS — Z7982 Long term (current) use of aspirin: Secondary | ICD-10-CM

## 2020-03-19 DIAGNOSIS — M109 Gout, unspecified: Secondary | ICD-10-CM | POA: Diagnosis present

## 2020-03-19 DIAGNOSIS — S72002D Fracture of unspecified part of neck of left femur, subsequent encounter for closed fracture with routine healing: Secondary | ICD-10-CM | POA: Diagnosis not present

## 2020-03-19 DIAGNOSIS — I480 Paroxysmal atrial fibrillation: Secondary | ICD-10-CM | POA: Diagnosis not present

## 2020-03-19 DIAGNOSIS — Z96652 Presence of left artificial knee joint: Secondary | ICD-10-CM | POA: Diagnosis present

## 2020-03-19 DIAGNOSIS — E785 Hyperlipidemia, unspecified: Secondary | ICD-10-CM | POA: Diagnosis not present

## 2020-03-19 DIAGNOSIS — Z801 Family history of malignant neoplasm of trachea, bronchus and lung: Secondary | ICD-10-CM

## 2020-03-19 DIAGNOSIS — M1990 Unspecified osteoarthritis, unspecified site: Secondary | ICD-10-CM | POA: Diagnosis present

## 2020-03-19 DIAGNOSIS — R001 Bradycardia, unspecified: Secondary | ICD-10-CM | POA: Diagnosis present

## 2020-03-19 DIAGNOSIS — I4891 Unspecified atrial fibrillation: Secondary | ICD-10-CM | POA: Diagnosis not present

## 2020-03-19 DIAGNOSIS — Z87891 Personal history of nicotine dependence: Secondary | ICD-10-CM | POA: Diagnosis not present

## 2020-03-19 DIAGNOSIS — K649 Unspecified hemorrhoids: Secondary | ICD-10-CM | POA: Diagnosis present

## 2020-03-19 DIAGNOSIS — Z96642 Presence of left artificial hip joint: Secondary | ICD-10-CM | POA: Diagnosis not present

## 2020-03-19 DIAGNOSIS — R7309 Other abnormal glucose: Secondary | ICD-10-CM | POA: Diagnosis not present

## 2020-03-19 HISTORY — PX: ORIF PERIPROSTHETIC FRACTURE: SHX5034

## 2020-03-19 LAB — CBC WITH DIFFERENTIAL/PLATELET
Abs Immature Granulocytes: 0.2 10*3/uL — ABNORMAL HIGH (ref 0.00–0.07)
Basophils Absolute: 0 10*3/uL (ref 0.0–0.1)
Basophils Relative: 0 %
Eosinophils Absolute: 0 10*3/uL (ref 0.0–0.5)
Eosinophils Relative: 0 %
HCT: 41.7 % (ref 39.0–52.0)
Hemoglobin: 13.4 g/dL (ref 13.0–17.0)
Immature Granulocytes: 2 %
Lymphocytes Relative: 5 %
Lymphs Abs: 0.6 10*3/uL — ABNORMAL LOW (ref 0.7–4.0)
MCH: 30.8 pg (ref 26.0–34.0)
MCHC: 32.1 g/dL (ref 30.0–36.0)
MCV: 95.9 fL (ref 80.0–100.0)
Monocytes Absolute: 0.4 10*3/uL (ref 0.1–1.0)
Monocytes Relative: 3 %
Neutro Abs: 11.8 10*3/uL — ABNORMAL HIGH (ref 1.7–7.7)
Neutrophils Relative %: 90 %
Platelets: 206 10*3/uL (ref 150–400)
RBC: 4.35 MIL/uL (ref 4.22–5.81)
RDW: 14.3 % (ref 11.5–15.5)
WBC: 13.1 10*3/uL — ABNORMAL HIGH (ref 4.0–10.5)
nRBC: 0.2 % (ref 0.0–0.2)

## 2020-03-19 LAB — COMPREHENSIVE METABOLIC PANEL
ALT: 34 U/L (ref 0–44)
AST: 45 U/L — ABNORMAL HIGH (ref 15–41)
Albumin: 3.9 g/dL (ref 3.5–5.0)
Alkaline Phosphatase: 80 U/L (ref 38–126)
Anion gap: 13 (ref 5–15)
BUN: 22 mg/dL (ref 8–23)
CO2: 22 mmol/L (ref 22–32)
Calcium: 8.9 mg/dL (ref 8.9–10.3)
Chloride: 102 mmol/L (ref 98–111)
Creatinine, Ser: 1.17 mg/dL (ref 0.61–1.24)
GFR calc Af Amer: >60 mL/min (ref >60)
GFR calc non Af Amer: >60 mL/min (ref >60)
Glucose, Bld: 137 mg/dL — ABNORMAL HIGH (ref 70–99)
Potassium: 4.9 mmol/L (ref 3.5–5.1)
Sodium: 137 mmol/L (ref 135–145)
Total Bilirubin: 0.8 mg/dL (ref 0.3–1.2)
Total Protein: 6.4 g/dL — ABNORMAL LOW (ref 6.5–8.1)

## 2020-03-19 LAB — TYPE AND SCREEN
ABO/RH(D): O POS
Antibody Screen: NEGATIVE

## 2020-03-19 LAB — SARS CORONAVIRUS 2 BY RT PCR (HOSPITAL ORDER, PERFORMED IN ~~LOC~~ HOSPITAL LAB): SARS Coronavirus 2: NEGATIVE

## 2020-03-19 SURGERY — OPEN REDUCTION INTERNAL FIXATION (ORIF) PERIPROSTHETIC FRACTURE
Anesthesia: Spinal | Site: Hip | Laterality: Left

## 2020-03-19 MED ORDER — PHENYLEPHRINE 40 MCG/ML (10ML) SYRINGE FOR IV PUSH (FOR BLOOD PRESSURE SUPPORT)
PREFILLED_SYRINGE | INTRAVENOUS | Status: DC | PRN
  Administered 2020-03-19 (×2): 120 ug via INTRAVENOUS
  Administered 2020-03-19: 200 ug via INTRAVENOUS
  Administered 2020-03-19: 80 ug via INTRAVENOUS
  Administered 2020-03-19: 120 ug via INTRAVENOUS
  Administered 2020-03-19 (×2): 80 ug via INTRAVENOUS

## 2020-03-19 MED ORDER — PHENYLEPHRINE 40 MCG/ML (10ML) SYRINGE FOR IV PUSH (FOR BLOOD PRESSURE SUPPORT)
PREFILLED_SYRINGE | INTRAVENOUS | Status: AC
  Filled 2020-03-19: qty 10

## 2020-03-19 MED ORDER — PHENOL 1.4 % MT LIQD: 1.0000 | OROMUCOSAL | Status: DC | PRN

## 2020-03-19 MED ORDER — VASOPRESSIN 20 UNIT/ML IV SOLN
INTRAVENOUS | Status: AC
  Filled 2020-03-19: qty 1

## 2020-03-19 MED ORDER — SODIUM CHLORIDE 0.9 % IR SOLN
Status: DC | PRN
  Administered 2020-03-19: 1000 mL

## 2020-03-19 MED ORDER — PROPOFOL 10 MG/ML IV BOLUS
INTRAVENOUS | Status: DC | PRN
  Administered 2020-03-19 (×2): 10 mg via INTRAVENOUS
  Administered 2020-03-19: 20 mg via INTRAVENOUS

## 2020-03-19 MED ORDER — ONDANSETRON HCL 4 MG PO TABS: 4.0000 mg | ORAL_TABLET | Freq: Four times a day (QID) | ORAL | Status: DC | PRN

## 2020-03-19 MED ORDER — TAMSULOSIN HCL 0.4 MG PO CAPS
0.4000 mg | ORAL_CAPSULE | Freq: Two times a day (BID) | ORAL | Status: DC
  Administered 2020-03-19 – 2020-03-22 (×6): 0.4 mg via ORAL
  Filled 2020-03-19 (×6): qty 1

## 2020-03-19 MED ORDER — METOPROLOL TARTRATE 5 MG/5ML IV SOLN
INTRAVENOUS | Status: DC | PRN
  Administered 2020-03-19: 2 mg via INTRAVENOUS

## 2020-03-19 MED ORDER — VASOPRESSIN 20 UNIT/ML IV SOLN
INTRAVENOUS | Status: DC | PRN
Start: 2020-03-19 — End: 2020-03-19
  Administered 2020-03-19 (×2): .5 [IU] via INTRAVENOUS
  Administered 2020-03-19: 1 [IU] via INTRAVENOUS

## 2020-03-19 MED ORDER — PREDNISONE 5 MG PO TABS
10.0000 mg | ORAL_TABLET | Freq: Once | ORAL | Status: AC
  Administered 2020-03-20: 10 mg via ORAL
  Filled 2020-03-19: qty 1
  Filled 2020-03-19: qty 2

## 2020-03-19 MED ORDER — CEFAZOLIN SODIUM-DEXTROSE 2-4 GM/100ML-% IV SOLN
2.0000 g | INTRAVENOUS | Status: AC
  Administered 2020-03-19: 2 g via INTRAVENOUS
  Filled 2020-03-19: qty 100

## 2020-03-19 MED ORDER — PREDNISONE 20 MG PO TABS
20.0000 mg | ORAL_TABLET | Freq: Two times a day (BID) | ORAL | Status: AC
  Administered 2020-03-20 – 2020-03-21 (×3): 20 mg via ORAL
  Filled 2020-03-19 (×3): qty 1

## 2020-03-19 MED ORDER — ACETAMINOPHEN 325 MG PO TABS
325.0000 mg | ORAL_TABLET | Freq: Four times a day (QID) | ORAL | Status: DC | PRN
  Administered 2020-03-21 – 2020-03-22 (×2): 650 mg via ORAL
  Filled 2020-03-19 (×2): qty 2

## 2020-03-19 MED ORDER — ONDANSETRON HCL 4 MG/2ML IJ SOLN
INTRAMUSCULAR | Status: AC
  Filled 2020-03-19: qty 2

## 2020-03-19 MED ORDER — POVIDONE-IODINE 10 % EX SWAB
2.0000 "application " | Freq: Once | CUTANEOUS | Status: AC
  Administered 2020-03-19: 2 via TOPICAL

## 2020-03-19 MED ORDER — MAGNESIUM CITRATE PO SOLN: 1.0000 | Freq: Once | ORAL | Status: DC | PRN

## 2020-03-19 MED ORDER — FENTANYL CITRATE (PF) 100 MCG/2ML IJ SOLN: 25.0000 ug | INTRAMUSCULAR | Status: DC | PRN

## 2020-03-19 MED ORDER — CHLORHEXIDINE GLUCONATE 4 % EX LIQD: 60.0000 mL | Freq: Once | CUTANEOUS | Status: DC

## 2020-03-19 MED ORDER — METHOCARBAMOL 500 MG IVPB - SIMPLE MED
500.0000 mg | Freq: Four times a day (QID) | INTRAVENOUS | Status: DC | PRN
  Filled 2020-03-19: qty 50

## 2020-03-19 MED ORDER — MENTHOL 3 MG MT LOZG: 1.0000 | LOZENGE | OROMUCOSAL | Status: DC | PRN

## 2020-03-19 MED ORDER — RIVAROXABAN 10 MG PO TABS
10.0000 mg | ORAL_TABLET | Freq: Every day | ORAL | Status: DC
  Administered 2020-03-20: 10 mg via ORAL
  Filled 2020-03-19: qty 1

## 2020-03-19 MED ORDER — OXYCODONE HCL 5 MG/5ML PO SOLN: 5.0000 mg | Freq: Once | ORAL | Status: AC | PRN

## 2020-03-19 MED ORDER — FENTANYL CITRATE (PF) 100 MCG/2ML IJ SOLN
INTRAMUSCULAR | Status: DC | PRN
  Administered 2020-03-19 (×2): 50 ug via INTRAVENOUS

## 2020-03-19 MED ORDER — ONDANSETRON HCL 4 MG/2ML IJ SOLN: 4.0000 mg | Freq: Once | INTRAMUSCULAR | Status: DC | PRN

## 2020-03-19 MED ORDER — PROPOFOL 10 MG/ML IV BOLUS
INTRAVENOUS | Status: AC
  Filled 2020-03-19: qty 20

## 2020-03-19 MED ORDER — LACTATED RINGERS IV SOLN: INTRAVENOUS | Status: DC

## 2020-03-19 MED ORDER — TRANEXAMIC ACID-NACL 1000-0.7 MG/100ML-% IV SOLN
1000.0000 mg | INTRAVENOUS | Status: AC
  Administered 2020-03-19: 1000 mg via INTRAVENOUS
  Filled 2020-03-19: qty 100

## 2020-03-19 MED ORDER — AMLODIPINE BESYLATE 5 MG PO TABS
5.0000 mg | ORAL_TABLET | Freq: Every day | ORAL | Status: DC
  Administered 2020-03-20: 5 mg via ORAL
  Filled 2020-03-19: qty 1

## 2020-03-19 MED ORDER — OXYCODONE HCL 5 MG PO TABS
5.0000 mg | ORAL_TABLET | Freq: Once | ORAL | Status: AC | PRN
  Administered 2020-03-19: 5 mg via ORAL

## 2020-03-19 MED ORDER — BISACODYL 10 MG RE SUPP: 10.0000 mg | Freq: Every day | RECTAL | Status: DC | PRN

## 2020-03-19 MED ORDER — DOCUSATE SODIUM 100 MG PO CAPS
100.0000 mg | ORAL_CAPSULE | Freq: Two times a day (BID) | ORAL | Status: DC
  Administered 2020-03-19 – 2020-03-22 (×6): 100 mg via ORAL
  Filled 2020-03-19 (×7): qty 1

## 2020-03-19 MED ORDER — ONDANSETRON HCL 4 MG/2ML IJ SOLN
INTRAMUSCULAR | Status: DC | PRN
  Administered 2020-03-19: 4 mg via INTRAVENOUS

## 2020-03-19 MED ORDER — SODIUM CHLORIDE 0.9 % IV SOLN: INTRAVENOUS | Status: DC

## 2020-03-19 MED ORDER — ALLOPURINOL 300 MG PO TABS
150.0000 mg | ORAL_TABLET | Freq: Every day | ORAL | Status: DC
  Administered 2020-03-20: 75 mg via ORAL
  Administered 2020-03-21 – 2020-03-22 (×2): 150 mg via ORAL
  Filled 2020-03-19 (×3): qty 1

## 2020-03-19 MED ORDER — DEXAMETHASONE SODIUM PHOSPHATE 10 MG/ML IJ SOLN
INTRAMUSCULAR | Status: DC | PRN
  Administered 2020-03-19: 5 mg via INTRAVENOUS

## 2020-03-19 MED ORDER — DEXAMETHASONE SODIUM PHOSPHATE 10 MG/ML IJ SOLN
INTRAMUSCULAR | Status: AC
  Filled 2020-03-19: qty 1

## 2020-03-19 MED ORDER — POLYETHYLENE GLYCOL 3350 17 G PO PACK: 17.0000 g | PACK | Freq: Every day | ORAL | Status: DC | PRN

## 2020-03-19 MED ORDER — PYRIDOSTIGMINE BROMIDE 60 MG PO TABS
60.0000 mg | ORAL_TABLET | Freq: Three times a day (TID) | ORAL | Status: DC
  Administered 2020-03-19 – 2020-03-22 (×8): 60 mg via ORAL
  Filled 2020-03-19 (×9): qty 1

## 2020-03-19 MED ORDER — OXYCODONE HCL 5 MG PO TABS
ORAL_TABLET | ORAL | Status: AC
  Filled 2020-03-19: qty 1

## 2020-03-19 MED ORDER — CHLORHEXIDINE GLUCONATE 0.12 % MT SOLN
15.0000 mL | Freq: Once | OROMUCOSAL | Status: AC
  Administered 2020-03-19: 15 mL via OROMUCOSAL

## 2020-03-19 MED ORDER — METOCLOPRAMIDE HCL 5 MG PO TABS: 5.0000 mg | ORAL_TABLET | Freq: Three times a day (TID) | ORAL | Status: DC | PRN

## 2020-03-19 MED ORDER — PREDNISONE 10 MG PO TABS
10.0000 mg | ORAL_TABLET | Freq: Once | ORAL | Status: DC
  Filled 2020-03-19: qty 1

## 2020-03-19 MED ORDER — ONDANSETRON HCL 4 MG/2ML IJ SOLN: 4.0000 mg | Freq: Four times a day (QID) | INTRAMUSCULAR | Status: DC | PRN

## 2020-03-19 MED ORDER — AMISULPRIDE (ANTIEMETIC) 5 MG/2ML IV SOLN: 10.0000 mg | Freq: Once | INTRAVENOUS | Status: DC | PRN

## 2020-03-19 MED ORDER — BUPIVACAINE HCL (PF) 0.5 % IJ SOLN
INTRAMUSCULAR | Status: DC | PRN
Start: 2020-03-19 — End: 2020-03-19
  Administered 2020-03-19: 3 mL

## 2020-03-19 MED ORDER — FENTANYL CITRATE (PF) 100 MCG/2ML IJ SOLN
INTRAMUSCULAR | Status: AC
  Filled 2020-03-19: qty 2

## 2020-03-19 MED ORDER — PRAVASTATIN SODIUM 40 MG PO TABS
40.0000 mg | ORAL_TABLET | Freq: Every evening | ORAL | Status: DC
  Administered 2020-03-20 – 2020-03-21 (×2): 40 mg via ORAL
  Filled 2020-03-19 (×2): qty 1

## 2020-03-19 MED ORDER — METOPROLOL TARTRATE 5 MG/5ML IV SOLN
INTRAVENOUS | Status: AC
  Filled 2020-03-19: qty 5

## 2020-03-19 MED ORDER — METOCLOPRAMIDE HCL 5 MG/ML IJ SOLN: 5.0000 mg | Freq: Three times a day (TID) | INTRAMUSCULAR | Status: DC | PRN

## 2020-03-19 MED ORDER — HYDROCODONE-ACETAMINOPHEN 7.5-325 MG PO TABS
1.0000 | ORAL_TABLET | ORAL | Status: DC | PRN
  Administered 2020-03-21 (×2): 2 via ORAL
  Filled 2020-03-19 (×2): qty 2

## 2020-03-19 MED ORDER — CEFAZOLIN SODIUM-DEXTROSE 2-4 GM/100ML-% IV SOLN
2.0000 g | Freq: Four times a day (QID) | INTRAVENOUS | Status: AC
  Administered 2020-03-19 – 2020-03-20 (×2): 2 g via INTRAVENOUS
  Filled 2020-03-19 (×2): qty 100

## 2020-03-19 MED ORDER — MORPHINE SULFATE (PF) 2 MG/ML IV SOLN: 0.5000 mg | INTRAVENOUS | Status: DC | PRN

## 2020-03-19 MED ORDER — ORAL CARE MOUTH RINSE: 15.0000 mL | Freq: Once | OROMUCOSAL | Status: AC

## 2020-03-19 MED ORDER — PROPOFOL 500 MG/50ML IV EMUL
INTRAVENOUS | Status: DC | PRN
  Administered 2020-03-19: 60 ug/kg/min via INTRAVENOUS

## 2020-03-19 MED ORDER — STERILE WATER FOR IRRIGATION IR SOLN
Status: DC | PRN
  Administered 2020-03-19 (×2): 1000 mL

## 2020-03-19 MED ORDER — TRAMADOL HCL 50 MG PO TABS
50.0000 mg | ORAL_TABLET | Freq: Four times a day (QID) | ORAL | Status: DC | PRN
  Administered 2020-03-19: 50 mg via ORAL
  Filled 2020-03-19: qty 1

## 2020-03-19 MED ORDER — HYDROCODONE-ACETAMINOPHEN 5-325 MG PO TABS
1.0000 | ORAL_TABLET | ORAL | Status: DC | PRN
  Administered 2020-03-19 – 2020-03-22 (×6): 2 via ORAL
  Filled 2020-03-19 (×6): qty 2

## 2020-03-19 MED ORDER — PANTOPRAZOLE SODIUM 40 MG PO TBEC
40.0000 mg | DELAYED_RELEASE_TABLET | Freq: Every day | ORAL | Status: DC
  Administered 2020-03-20 – 2020-03-22 (×3): 40 mg via ORAL
  Filled 2020-03-19 (×3): qty 1

## 2020-03-19 MED ORDER — BUPIVACAINE HCL (PF) 0.5 % IJ SOLN
INTRAMUSCULAR | Status: AC
  Filled 2020-03-19: qty 30

## 2020-03-19 MED ORDER — PROPOFOL 500 MG/50ML IV EMUL
INTRAVENOUS | Status: AC
  Filled 2020-03-19: qty 50

## 2020-03-19 MED ORDER — HYDROCHLOROTHIAZIDE 25 MG PO TABS
50.0000 mg | ORAL_TABLET | Freq: Every day | ORAL | Status: DC
  Filled 2020-03-19: qty 2

## 2020-03-19 MED ORDER — METHOCARBAMOL 500 MG PO TABS: 500.0000 mg | ORAL_TABLET | Freq: Four times a day (QID) | ORAL | Status: DC | PRN

## 2020-03-19 MED ORDER — PHENYLEPHRINE HCL-NACL 10-0.9 MG/250ML-% IV SOLN
INTRAVENOUS | Status: DC | PRN
  Administered 2020-03-19: 60 ug/min via INTRAVENOUS

## 2020-03-19 MED ORDER — METOPROLOL TARTRATE 25 MG PO TABS
25.0000 mg | ORAL_TABLET | Freq: Two times a day (BID) | ORAL | Status: DC
  Administered 2020-03-19 – 2020-03-22 (×6): 25 mg via ORAL
  Filled 2020-03-19 (×7): qty 1

## 2020-03-19 SURGICAL SUPPLY — 62 items
BAG SPEC THK2 15X12 ZIP CLS (MISCELLANEOUS) ×1
BAG ZIPLOCK 12X15 (MISCELLANEOUS) ×2 IMPLANT
BALL HIP CERAMIC 32MM PLUS 9 IMPLANT
BLADE SAG 18X100X1.27 (BLADE) ×1 IMPLANT
CABLE CERLAGE W/CRIMP 1.8 (Cable) ×3 IMPLANT
COVER SURGICAL LIGHT HANDLE (MISCELLANEOUS) ×2 IMPLANT
COVER WAND RF STERILE (DRAPES) IMPLANT
DRAPE INCISE IOBAN 66X45 STRL (DRAPES) ×2 IMPLANT
DRAPE ORTHO SPLIT 77X108 STRL (DRAPES) ×4
DRAPE POUCH INSTRU U-SHP 10X18 (DRAPES) ×2 IMPLANT
DRAPE SURG 17X11 SM STRL (DRAPES) ×2 IMPLANT
DRAPE SURG ORHT 6 SPLT 77X108 (DRAPES) ×2 IMPLANT
DRAPE U-SHAPE 47X51 STRL (DRAPES) ×2 IMPLANT
DRSG AQUACEL AG ADV 3.5X14 (GAUZE/BANDAGES/DRESSINGS) ×1 IMPLANT
DRSG EMULSION OIL 3X16 NADH (GAUZE/BANDAGES/DRESSINGS) ×2 IMPLANT
DRSG PAD ABDOMINAL 8X10 ST (GAUZE/BANDAGES/DRESSINGS) ×4 IMPLANT
DURAPREP 26ML APPLICATOR (WOUND CARE) ×2 IMPLANT
ELECT BLADE TIP CTD 4 INCH (ELECTRODE) ×2 IMPLANT
ELECT REM PT RETURN 15FT ADLT (MISCELLANEOUS) ×2 IMPLANT
EVACUATOR 1/8 PVC DRAIN (DRAIN) ×2 IMPLANT
FACESHIELD WRAPAROUND (MASK) ×8 IMPLANT
FACESHIELD WRAPAROUND OR TEAM (MASK) ×4 IMPLANT
GAUZE SPONGE 4X4 12PLY STRL (GAUZE/BANDAGES/DRESSINGS) ×4 IMPLANT
GLOVE BIO SURGEON STRL SZ7.5 (GLOVE) ×2 IMPLANT
GLOVE BIO SURGEON STRL SZ8 (GLOVE) ×2 IMPLANT
GLOVE BIOGEL PI IND STRL 7.0 (GLOVE) ×1 IMPLANT
GLOVE BIOGEL PI IND STRL 8 (GLOVE) ×1 IMPLANT
GLOVE BIOGEL PI INDICATOR 7.0 (GLOVE) ×1
GLOVE BIOGEL PI INDICATOR 8 (GLOVE) ×1
GLOVE ORTHO TXT STRL SZ7.5 (GLOVE) ×2 IMPLANT
GLOVE SURG SS PI 7.0 STRL IVOR (GLOVE) ×2 IMPLANT
GOWN STRL REUS W/TWL LRG LVL3 (GOWN DISPOSABLE) ×6 IMPLANT
HIP BALL CERAMIC 32MM PLUS 9 ×2 IMPLANT
IMMOBILIZER KNEE 20 (SOFTGOODS)
IMMOBILIZER KNEE 20 THIGH 36 (SOFTGOODS) IMPLANT
KIT BASIN OR (CUSTOM PROCEDURE TRAY) ×2 IMPLANT
KIT TURNOVER KIT A (KITS) IMPLANT
MANIFOLD NEPTUNE II (INSTRUMENTS) ×2 IMPLANT
NS IRRIG 1000ML POUR BTL (IV SOLUTION) ×4 IMPLANT
PACK TOTAL JOINT (CUSTOM PROCEDURE TRAY) ×2 IMPLANT
PADDING CAST COTTON 6X4 STRL (CAST SUPPLIES) ×4 IMPLANT
PASSER SUT SWANSON 36MM LOOP (INSTRUMENTS) IMPLANT
PENCIL SMOKE EVACUATOR (MISCELLANEOUS) IMPLANT
PROTECTOR NERVE ULNAR (MISCELLANEOUS) ×2 IMPLANT
SPONGE LAP 18X18 RF (DISPOSABLE) ×2 IMPLANT
SPONGE LAP 4X18 RFD (DISPOSABLE) ×2 IMPLANT
STAPLER VISISTAT 35W (STAPLE) ×2 IMPLANT
STEM STR SOL 8 16.5 LRG (Stem) ×1 IMPLANT
STRIP CLOSURE SKIN 1/2X4 (GAUZE/BANDAGES/DRESSINGS) ×2 IMPLANT
SUCTION FRAZIER HANDLE 10FR (MISCELLANEOUS) ×2
SUCTION TUBE FRAZIER 10FR DISP (MISCELLANEOUS) ×1 IMPLANT
SUT ETHIBOND NAB CT1 #1 30IN (SUTURE) ×2 IMPLANT
SUT MNCRL AB 4-0 PS2 18 (SUTURE) IMPLANT
SUT STRATAFIX 0 PDS 27 VIOLET (SUTURE) ×2
SUT VIC AB 1 CT1 27 (SUTURE) ×6
SUT VIC AB 1 CT1 27XBRD ANTBC (SUTURE) ×3 IMPLANT
SUT VIC AB 2-0 CT1 27 (SUTURE) ×6
SUT VIC AB 2-0 CT1 TAPERPNT 27 (SUTURE) ×3 IMPLANT
SUTURE STRATFX 0 PDS 27 VIOLET (SUTURE) IMPLANT
TOWEL OR 17X26 10 PK STRL BLUE (TOWEL DISPOSABLE) ×4 IMPLANT
TRAY FOLEY MTR SLVR 16FR STAT (SET/KITS/TRAYS/PACK) ×2 IMPLANT
WATER STERILE IRR 1000ML POUR (IV SOLUTION) ×2 IMPLANT

## 2020-03-19 NOTE — Anesthesia Postprocedure Evaluation (Signed)
Anesthesia Post Note  [name]  Procedure(s) Performed: OPEN REDUCTION INTERNAL FIXATION (ORIF) PERIPROSTHETIC FRACTURE LEFT FEMUR WITH FEMORAL REVISION (Left Hip)     Patient location during evaluation: PACU Anesthesia Type: Spinal Level of consciousness: oriented and awake and alert Pain management: pain level controlled Vital Signs Assessment: post-procedure vital signs reviewed and stable Respiratory status: spontaneous breathing, respiratory function stable and nonlabored ventilation Cardiovascular status: blood pressure returned to baseline and stable Postop Assessment: no headache, no backache, no apparent nausea or vomiting and spinal receding Anesthetic complications: no   No complications documented.  Last Vitals:  Vitals:   03/19/20 1206  BP: (!) 139/100  Pulse: 62  Resp: 16  Temp: 36.8 C  SpO2: 100%    Last Pain:  Vitals:   03/19/20 1206  TempSrc: Oral                 Lidia Collum

## 2020-03-19 NOTE — Progress Notes (Signed)
Patient with sudden onset atrial fibrillation once intraoperatively, no prior reported history of Afib. Rate ranged from 110-120 throughout the case. Estimated 200 mL blood loss. Will plan to place on telemetry and 12 lead EKG ordered to be done in PACU. Cardiology consulted and received recommendations, they plan to see the patient tomorrow during rounds.  Theresa Duty, PA-C Orthopedic Surgery [facility] [place]

## 2020-03-19 NOTE — Brief Op Note (Signed)
03/19/2020  6:39 PM  PATIENT:  Brandon [name]  74 y.o. male  PRE-OPERATIVE DIAGNOSIS:  Left peri prosthetic femur fracture  POST-OPERATIVE DIAGNOSIS:  Left peri prosthetic femur fracture  PROCEDURE:  Procedure(s): OPEN REDUCTION INTERNAL FIXATION (ORIF) PERIPROSTHETIC FRACTURE LEFT FEMUR WITH FEMORAL REVISION (Left)  SURGEON:  Surgeon(s) and Role:    Gaynelle Arabian, MD - Primary  PHYSICIAN ASSISTANT:   ASSISTANTS: Kristie Edmistem PA-C   ANESTHESIA:   Spinal  EBL:  200 mL   BLOOD ADMINISTERED:none  DRAINS- None  LOCAL MEDICATIONS USED:  NONE  COUNTS:  YES  TOURNIQUET:  * No tourniquets in log *  DICTATION: .Other Dictation: Dictation Number [id number]  PLAN OF CARE: Admit to inpatient   PATIENT DISPOSITION:  PACU - hemodynamically stable.

## 2020-03-19 NOTE — Transfer of Care (Signed)
Immediate Anesthesia Transfer of Care Note  [name]  Procedure(s) Performed: OPEN REDUCTION INTERNAL FIXATION (ORIF) PERIPROSTHETIC FRACTURE LEFT FEMUR WITH FEMORAL REVISION (Left Hip)  Patient Location: PACU  Anesthesia Type:Spinal  Level of Consciousness: awake, alert , oriented and patient cooperative  Airway & Oxygen Therapy: Patient Spontanous Breathing and Patient connected to face mask oxygen  Post-op Assessment: Report given to RN, Post -op Vital signs reviewed and stable and Patient moving all extremities X 4  Post vital signs: stable  Last Vitals:  Vitals Value Taken Time  BP 118/81 03/19/20 1845  Temp    Pulse 54 03/19/20 1853  Resp 14 03/19/20 1853  SpO2 100 % 03/19/20 1853  Vitals shown include unvalidated device data.  Last Pain:  Vitals:   03/19/20 1206  TempSrc: Oral         Complications: No complications documented.  In new onset  afib since beginning of case

## 2020-03-19 NOTE — Anesthesia Preprocedure Evaluation (Addendum)
Anesthesia Evaluation  Patient identified by MRN, date of birth, ID band Patient awake    Reviewed: Allergy & Precautions, NPO status , Patient's Chart, lab work & pertinent test results  History of Anesthesia Complications Negative for: history of anesthetic complications  Airway Mallampati: II  TM Distance: >3 FB Neck ROM: Full    Dental   Pulmonary neg pulmonary ROS, former smoker,    Pulmonary exam normal        Cardiovascular hypertension, + CAD and + CABG (2015)  Normal cardiovascular exam+ dysrhythmias Atrial Fibrillation      Neuro/Psych  Neuromuscular disease (myasthenia gravis) negative psych ROS   GI/Hepatic Neg liver ROS, GERD  ,  Endo/Other  negative endocrine ROS  Renal/GU negative Renal ROS  negative genitourinary   Musculoskeletal  (+) Arthritis ,   Abdominal   Peds  Hematology negative hematology ROS (+)   Anesthesia Other Findings   Reproductive/Obstetrics                            Anesthesia Physical Anesthesia Plan  ASA: III  Anesthesia Plan: Spinal   Post-op Pain Management:    Induction:   PONV Risk Score and Plan: 1 and Propofol infusion, Treatment may vary due to age or medical condition, Ondansetron and TIVA  Airway Management Planned: Nasal Cannula and Simple Face Mask  Additional Equipment: None  Intra-op Plan:   Post-operative Plan:   Informed Consent: I have reviewed the patients History and Physical, chart, labs and discussed the procedure including the risks, benefits and alternatives for the proposed anesthesia with the patient or authorized representative who has indicated his/her understanding and acceptance.       Plan Discussed with:   Anesthesia Plan Comments:         Anesthesia Quick Evaluation

## 2020-03-19 NOTE — Plan of Care (Signed)

## 2020-03-19 NOTE — Progress Notes (Addendum)
   Theresa Duty, PA-C with Ortho, called requesting consult for patient. Patient is at [facility]. He had ORIF of left femur today and developed atrial fibrillation intraoperatively. Rates in the 110's to 120's. BP stable. No history of atrial fibrillation. Recommend starting Lopressor 25mg  twice daily first. If additional rate control is needed overnight, can start Cardizem drip (would then need to stop home Amlodipine). Asked for EKG while in atrial fibrillation. Will go ahead and order Echo to be done tomorrow. CHA2DS2-VASc = at least 3 (CAD, HTN, age). Discussed potential anticoagulation with Drue Dun, who stated they would prefer to wait until tomorrow morning before starting this. Will add patient to list for [facility] rounding team to see in the morning.  Of note, patient is on Mestinon at home which does have the potential to enhance bradycardic effect of beta-blocker. Patient has been placed on telemetry so we can monitor this carefully.  Darreld Mclean, PA-C 03/19/2020 6:47 PM

## 2020-03-19 NOTE — Discharge Instructions (Addendum)
Dr. Gaynelle Arabian Total Joint Specialist [facility] [address], [place], [place] [address] [phone]  POSTERIOR TOTAL HIP REPLACEMENT POSTOPERATIVE DIRECTIONS  Hip Rehabilitation, Guidelines Following Surgery  The results of a hip operation are greatly improved after range of motion and muscle strengthening exercises. Follow all safety measures which are given to protect your hip. If any of these exercises cause increased pain or swelling in your joint, decrease the amount until you are comfortable again. Then slowly increase the exercises. Call your caregiver if you have problems or questions.   BLOOD CLOT PREVENTION . Take a 10 mg Xarelto once a day for three weeks following surgery. Then resume one 81 mg Aspirin once a day. . You may resume your vitamins/supplements once you have discontinued the Xarelto. . Do not take any NSAIDs (Advil, Aleve, Ibuprofen, Meloxicam, etc.) until you have discontinued the Xarelto.   PRECAUTIONS (6 WEEKS FOLLOWING SURGERY) . Do not bend your hip past a 90 degree angle . Do not cross your legs. . Don't twist your hip inwards- keep knees and toes pointed upwards   HOME CARE INSTRUCTIONS  . Remove items at home which could result in a fall. This includes throw rugs or furniture in walking pathways.   ICE to the affected hip every three hours for 30 minutes at a time and then as needed for pain and swelling.  Continue to use ice on the hip for pain and swelling from surgery. You may notice swelling that will progress down to the foot and ankle.  This is normal after surgery.  Elevate the leg when you are not up walking on it.    Continue to use the breathing machine which will help keep your temperature down.  It is common for your temperature to cycle up and down following surgery, especially at night when you are not up moving around and exerting yourself.  The breathing machine keeps your lungs expanded and your temperature  down.  DIET You may resume your previous home diet once your are discharged from the hospital.  DRESSING / WOUND CARE / SHOWERING You have an adhesive waterproof bandage over the incision. Leave this in place until your first follow-up appointment. You may shower 3 days after surgery. Do not submerge the incision under water until cleared by your surgeon to do so.  ACTIVITY Walk with your walker as instructed. Use walker as long as suggested by your caregivers. Avoid periods of inactivity such as sitting longer than an hour when not asleep. This helps prevent blood clots.  You may resume a sexual relationship in one month or when given the OK by your doctor.  You may return to work once you are cleared by your doctor.  Do not drive a car for 6 weeks or until released by you surgeon.  Do not drive while taking narcotics.  WEIGHT BEARING Weight bearing as tolerated to the left leg.  POSTOPERATIVE CONSTIPATION PROTOCOL Constipation - defined medically as fewer than three stools per week and severe constipation as less than one stool per week.  One of the most common issues patients have following surgery is constipation.  Even if you have a regular bowel pattern at home, your normal regimen is likely to be disrupted due to multiple reasons following surgery.  Combination of anesthesia, postoperative narcotics, change in appetite and fluid intake all can affect your bowels.  In order to avoid complications following surgery, here are some recommendations in order to help you  during your recovery period.  Colace (docusate) - Pick up an over-the-counter form of Colace or another stool softener and take twice a day as long as you are requiring postoperative pain medications.  Take with a full glass of water daily.  If you experience loose stools or diarrhea, hold the colace until you stool forms back up.  If your symptoms do not get better within 1 week or if they get worse, check with your  doctor.  Dulcolax (bisacodyl) - Pick up over-the-counter and take as directed by the product packaging as needed to assist with the movement of your bowels.  Take with a full glass of water.  Use this product as needed if not relieved by Colace only.   MiraLax (polyethylene glycol) - Pick up over-the-counter to have on hand.  MiraLax is a solution that will increase the amount of water in your bowels to assist with bowel movements.  Take as directed and can mix with a glass of water, juice, soda, coffee, or tea.  Take if you go more than two days without a movement. Do not use MiraLax more than once per day. Call your doctor if you are still constipated or irregular after using this medication for 7 days in a row.  If you continue to have problems with postoperative constipation, please contact the office for further assistance and recommendations.  If you experience "the worst abdominal pain ever" or develop nausea or vomiting, please contact the office immediatly for further recommendations for treatment.  ITCHING  If you experience itching with your medications, try taking only a single pain pill, or even half a pain pill at a time.  You can also use Benadryl over the counter for itching or also to help with sleep.   TED HOSE STOCKINGS Wear the elastic stockings on both legs for three weeks following surgery during the day but you may remove then at night for sleeping.  MEDICATIONS See your medication summary on the "After Visit Summary" that the nursing staff will review with you prior to discharge.  You may have some home medications which will be placed on hold until you complete the course of blood thinner medication.  It is important for you to complete the blood thinner medication as prescribed by your surgeon.  Continue your approved medications as instructed at time of discharge.  PRECAUTIONS If you experience chest pain or shortness of breath - call 911 immediately for transfer to the  hospital emergency department.  If you develop a fever greater that 101 F, purulent drainage from wound, increased redness or drainage from wound, foul odor from the wound/dressing, or calf pain - CONTACT YOUR SURGEON.                                                   FOLLOW-UP APPOINTMENTS Make sure you keep all of your appointments after your operation with your surgeon and caregivers. You should call the office at the above phone number and make an appointment for approximately two weeks after the date of your surgery or on the date instructed by your surgeon outlined in the "After Visit Summary".  RANGE OF MOTION AND STRENGTHENING EXERCISES  These exercises are designed to help you keep full movement of your hip joint. Follow your caregiver's or physical therapist's instructions. Perform all exercises about fifteen times, three  times per day or as directed. Exercise both hips, even if you have had only one joint replacement. These exercises can be done on a training (exercise) mat, on the floor, on a table or on a bed. Use whatever works the best and is most comfortable for you. Use music or television while you are exercising so that the exercises are a pleasant break in your day. This will make your life better with the exercises acting as a break in routine you can look forward to.  . Lying on your back, slowly slide your foot toward your buttocks, raising your knee up off the floor. Then slowly slide your foot back down until your leg is straight again.  . Lying on your back spread your legs as far apart as you can without causing discomfort.  . Lying on your side, raise your upper leg and foot straight up from the floor as far as is comfortable. Slowly lower the leg and repeat.  . Lying on your back, tighten up the muscle in the front of your thigh (quadriceps muscles). You can do this by keeping your leg straight and trying to raise your heel off the floor. This helps strengthen the largest  muscle supporting your knee.  . Lying on your back, tighten up the muscles of your buttocks both with the legs straight and with the knee bent at a comfortable angle while keeping your heel on the floor.   IF YOU ARE TRANSFERRED TO A SKILLED REHAB FACILITY If the patient is transferred to a skilled rehab facility following release from the hospital, a list of the current medications will be sent to the facility for the patient to continue.  When discharged from the skilled rehab facility, please have the facility set up the patient's [place] prior to being released. Also, the skilled facility will be responsible for providing the patient with their medications at time of release from the facility to include their pain medication, the muscle relaxants, and their blood thinner medication. If the patient is still at the rehab facility at time of the two week follow up appointment, the skilled rehab facility will also need to assist the patient in arranging follow up appointment in our office and any transportation needs.  MAKE SURE YOU:  . Understand these instructions.  . Get help right away if you are not doing well or get worse.    Pick up stool softner and laxative for home use following surgery while on pain medications. Do not submerge incision under water. Please use good hand washing techniques while changing dressing each day. May shower starting three days after surgery. Please use a clean towel to pat the incision dry following showers. Continue to use ice for pain and swelling after surgery. Do not use any lotions or creams on the incision until instructed by your surgeon.  Information on my medicine - ELIQUIS (apixaban)  Why was Eliquis prescribed for you? Eliquis was prescribed for you to reduce the risk of a blood clot forming that can cause a stroke if you have a medical condition called atrial fibrillation (a type of irregular heartbeat).  What do You  need to know about Eliquis ? Take your Eliquis TWICE DAILY - one tablet in the morning and one tablet in the evening with or without food. If you have difficulty swallowing the tablet whole please discuss with your pharmacist how to take the medication safely.  Take Eliquis exactly as prescribed by your doctor  and DO NOT stop taking Eliquis without talking to the doctor who prescribed the medication.  Stopping may increase your risk of developing a stroke.  Refill your prescription before you run out.  After discharge, you should have regular check-up appointments with your healthcare provider that is prescribing your Eliquis.  In the future your dose may need to be changed if your kidney function or weight changes by a significant amount or as you get older.  What do you do if you miss a dose? If you miss a dose, take it as soon as you remember on the same day and resume taking twice daily.  Do not take more than one dose of ELIQUIS at the same time to make up a missed dose.  Important Safety Information A possible side effect of Eliquis is bleeding. You should call your healthcare provider right away if you experience any of the following: ? Bleeding from an injury or your nose that does not stop. ? Unusual colored urine (red or dark brown) or unusual colored stools (red or black). ? Unusual bruising for unknown reasons. ? A serious fall or if you hit your head (even if there is no bleeding).  Some medicines may interact with Eliquis and might increase your risk of bleeding or clotting while on Eliquis. To help avoid this, consult your healthcare provider or pharmacist prior to using any new prescription or non-prescription medications, including herbals, vitamins, non-steroidal anti-inflammatory drugs (NSAIDs) and supplements.  This website has more information on Eliquis (apixaban): http://www.eliquis.com/eliquis/home

## 2020-03-19 NOTE — H&P (Signed)
ADMISSION H&P  Patient is admitted for ORIF periprosthetic fracture left femur with femoral revision  Subjective:  Chief Complaint: Left hip pain  HPI: Brandon [name], 74 y.o. male, is status post left total hip arthroplasty that was performed by Gaynelle Arabian, MD on 01/28/2020. Patient presented to our office prior to surgery with a femoral neck fracture, warranting THA. He was doing well postoperatively, but had an increase in pain four weeks out from surgery. MRI was obtained of the hip which showed a hematoma and questionable stress fracture. Hematoma was aspirated in the office and patient was ordered TDWB due to questionable fracture. Patient followed up in two weeks, and noted that his pain with slight movement had worsened. XRs were obtained which demonstrated a periprosthetic fracture. Patient and wife were instructed to go straight to the hospital for operative fixation.  Patient Active Problem List   Diagnosis Date Noted  . OA (osteoarthritis) of hip 01/28/2020  . Closed displaced fracture of left femoral neck ([place]) 01/28/2020  . History of colonic polyps 11/12/2019  . GERD (gastroesophageal reflux disease) 11/06/2019  . Myasthenia gravis with (acute) exacerbation ([place]) 11/06/2019  . Acute diarrhea 09/17/2019  . Heme positive stool 09/17/2019  . Unspecified osteoarthritis, unspecified site   . Hyperlipidemia, unspecified   . Personal history of other diseases of the musculoskeletal system and connective tissue   . Atherosclerotic heart disease of native coronary artery without angina pectoris   . Hypertensive heart disease without heart failure   . Melena   . Plantar fascial fibromatosis   . Pleurodynia   . Calculus of gallbladder without cholecystitis without obstruction 09/14/2018  . Nausea without vomiting 06/05/2018  . Postoperative atrial fibrillation ([place]) 11/09/2013  . S/P CABG x 4 10/19/2013  . Coronary atherosclerosis of native coronary artery 10/18/2013  .  Essential hypertension, benign 09/18/2013  . Family history of hypertrophic cardiomyopathy 09/18/2013  . Mixed hyperlipidemia     Past Medical History:  Diagnosis Date  . Arthritis   . Atherosclerotic heart disease of native coronary artery without angina pectoris   . Cataract   . Coronary atherosclerosis of native coronary artery    Multivessel status post CABG March 2015  . DDD (degenerative disc disease), lumbar    Dr. Carloyn Manner  . Essential hypertension, benign   . GERD (gastroesophageal reflux disease)   . Hard of hearing   . Hemorrhoids   . History of gout   . History of kidney stones   . History of [facility] spotted fever   . Hyperlipidemia, unspecified   . Hypertensive heart disease without heart failure   . Kidney stones   . Lumbago   . Lumbago   . Melena   . Mixed hyperlipidemia   . 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 Personal history of other diseases of the musculoskeletal system and connective tissue   . Plantar fascial fibromatosis   . Pleurodynia   . Renal calculus or stone 05/22/14  . Stress fracture 2021   left hip  . Unspecified abdominal pain   . Unspecified osteoarthritis, unspecified site     Past Surgical History:  Procedure Laterality Date  . AMPUTATION Right 03/27/2014   Procedure: DEBRIDEMENT AND CLOSURE RIGHT INDEX FINGER;  Surgeon: [name], MD;  Location: [place];  Service: Orthopedics;  Laterality: Right;  . BACK SURGERY    . BICEPT TENODESIS  11/26/2011   Procedure: BICEPT TENODESIS;  Surgeon: Augustin Schooling, MD;  Location: [place];  Service: Orthopedics;  Laterality: Left;  . BIOPSY  08/03/2018   Procedure: BIOPSY;  Surgeon: Rogene Houston, MD;  [facility];  Service: Endoscopy;;  [place]  . Cataract surgery Left   . CHOLECYSTECTOMY N/A 09/22/2018   Procedure: LAPAROSCOPIC CHOLECYSTECTOMY;  Surgeon: Virl Cagey, MD;  Location: [facility];  Service: General;  Laterality: N/A;  . COLONOSCOPY    . COLONOSCOPY N/A 12/26/2014    Procedure: COLONOSCOPY;  Surgeon: Rogene Houston, MD;  [facility];  Service: Endoscopy;  Laterality: N/A;  730  . CORONARY ARTERY BYPASS GRAFT N/A 10/19/2013   Procedure: CORONARY ARTERY BYPASS GRAFTING (CABG);  Surgeon: Rexene Alberts, MD;  Location: [place];  Service: Open Heart Surgery;  Laterality: N/A;  CABG times four using left internal mammary artery and left leg saphenous vein, incision made on right leg but no vein removed  . CYSTOSCOPY WITH RETROGRADE PYELOGRAM, URETEROSCOPY AND STENT PLACEMENT Left 08/24/2016   Procedure: CYSTOSCOPY WITH LEFT RETROGRADE PYELOGRAM, LEFT URETEROSCOPY, BASKET EXTRACTION LEFT URETERAL STONE;  Surgeon: Irine Seal, MD;  Location: [facility];  Service: Urology;  Laterality: Left;  . ESOPHAGOGASTRODUODENOSCOPY N/A 08/03/2018   Procedure: ESOPHAGOGASTRODUODENOSCOPY (EGD);  Surgeon: Rogene Houston, MD;  [facility];  Service: Endoscopy;  Laterality: N/A;  2:55  . EXTRACORPOREAL SHOCK WAVE LITHOTRIPSY Right 06/18/2019   Procedure: EXTRACORPOREAL SHOCK WAVE LITHOTRIPSY (ESWL);  Surgeon: Lucas Mallow, MD;  [facility];  Service: Urology;  Laterality: Right;  . INTRAOPERATIVE TRANSESOPHAGEAL ECHOCARDIOGRAM N/A 10/19/2013   Procedure: INTRAOPERATIVE TRANSESOPHAGEAL ECHOCARDIOGRAM;  Surgeon: Rexene Alberts, MD;  Location: [place];  Service: Open Heart Surgery;  Laterality: N/A;  . JOINT REPLACEMENT    . LEFT HEART CATHETERIZATION WITH CORONARY ANGIOGRAM N/A 10/09/2013   Procedure: LEFT HEART CATHETERIZATION WITH CORONARY ANGIOGRAM;  Surgeon: Burnell Blanks, MD;  [facility] C[facility] LAB;  Service: Cardiovascular;  Laterality: N/A;  . Left shoulder rotator cuff repair    . LITHOTRIPSY    . TOTAL HIP ARTHROPLASTY Left 01/28/2020   Procedure: TOTAL HIP ARTHROPLASTY ANTERIOR APPROACH;  Surgeon: Gaynelle Arabian, MD;  [facility];  Service: Orthopedics;  Laterality: Left;  143min  . TOTAL KNEE ARTHROPLASTY Left   . traumaticvamputation of  finger  2015  . VASECTOMY      Prior to Admission medications   Medication Sig Start Date End Date Taking? Authorizing Provider  acetaminophen (TYLENOL) 500 MG tablet Take 1,000 mg by mouth daily.   Yes [provider]  allopurinol (ZYLOPRIM) 300 MG tablet Take 300 mg by mouth daily.   Yes [provider]  amLODipine (NORVASC) 5 MG tablet Take 1 tablet (5 mg total) by mouth daily. 11/11/19  Yes Shelly Coss, MD  aspirin EC 81 MG tablet Take 81 mg by mouth daily. Swallow whole.   Yes [provider]  Calcium 600-200 MG-UNIT tablet Take 1 tablet by mouth daily.   Yes [provider]  Multiple Vitamin (MULTIVITAMIN) tablet Take 1 tablet by mouth daily.   Yes [provider]  Omega-3 Fatty Acids (FISH OIL) 500 MG CAPS Take by mouth daily.   Yes [provider]  pantoprazole (PROTONIX) 40 MG tablet Take 1 tablet (40 mg total) by mouth daily. 11/10/19 11/09/20 Yes Shelly Coss, MD  pravastatin (PRAVACHOL) 40 MG tablet Take 40 mg by mouth every evening.    Yes [provider]  predniSONE (DELTASONE) 10 MG tablet Take as directed; prednisone tapering 03/18/20  Yes Penumalli, Earlean Polka, MD  pyridostigmine (MESTINON) 60 MG tablet Take 1 tablet (60 mg total) by mouth 3 (three) times daily. 01/08/20  Yes Penumalli, Earlean Polka, MD  Simethicone (GAS FREE EXTRA STRENGTH PO) Take by mouth. After meals   Yes [provider]  tamsulosin (FLOMAX) 0.4 MG CAPS capsule Take 0.4 mg by mouth 2 (two) times daily.  07/12/16  Yes [provider]  UNABLE TO FIND Med Name: Osteo bioflex   Yes [provider]  hydrochlorothiazide (HYDRODIURIL) 50 MG tablet Take 50 mg by mouth daily.     [provider]  HYDROcodone-acetaminophen (NORCO/VICODIN) 5-325 MG tablet Take 1-2 tablets by mouth every 6 (six) hours as needed for severe pain. Patient not taking: Reported on 03/18/2020 01/29/20   Maurice March, PA-C  methocarbamol  (ROBAXIN) 500 MG tablet Take 1 tablet (500 mg total) by mouth every 6 (six) hours as needed for muscle spasms. Patient not taking: Reported on 03/18/2020 01/29/20   Maurice March, PA-C  nitroGLYCERIN (NITROSTAT) 0.4 MG SL tablet Place 1 tablet (0.4 mg total) under the tongue every 5 (five) minutes as needed. 04/03/19   Satira Sark, MD  potassium chloride (K-DUR,KLOR-CON) 10 MEQ tablet Take 10 mEq by mouth daily. Patient not taking: Reported on 03/18/2020    [provider]  traMADol (ULTRAM) 50 MG tablet Take 1-2 tablets (50-100 mg total) by mouth every 6 (six) hours as needed for moderate pain. Patient not taking: Reported on 03/18/2020 01/29/20   Maurice March, PA-C    No Known Allergies  Social History   Socioeconomic History  . Marital status: Married    Spouse name: [name]  . Number of children: 2  . Years of education: Not on file  . Highest education level: 10th grade  Occupational History  . Occupation: lumber mills  Tobacco Use  . Smoking status: Former Smoker    Packs/day: 2.00    Years: 30.00    Pack years: 60.00    Types: Cigarettes    Start date: 08/16/1958    Quit date: 08/17/1983    Years since quitting: 36.6  . Smokeless tobacco: Former Systems developer    Types: Chew  . Tobacco comment: quit smoking 57yrs ago  Vaping Use  . Vaping Use: Never used  Substance and Sexual Activity  . Alcohol use: Yes    Alcohol/week: 0.0 standard drinks    Comment: Occassional  . Drug use: No  . Sexual activity: Yes  Other Topics Concern  . Not on file  Social History Narrative   Lives with wife   Caffeine- coffee 3- 4 c daily   Social Determinants of Health   Financial Resource Strain:   . Difficulty of Paying Living Expenses:   Food Insecurity:   . Worried About Charity fundraiser in the Last Year:   . Arboriculturist in the Last Year:   Transportation Needs:   . Film/video editor (Medical):   Marland Kitchen Lack of Transportation (Non-Medical):   Physical Activity:   .  Days of Exercise per Week:   . Minutes of Exercise per Session:   Stress:   . Feeling of Stress :   Social Connections:   . Frequency of Communication with Friends and Family:   . Frequency of Social Gatherings with Friends and Family:   . Attends Religious Services:   . Active Member of Clubs or Organizations:   . Attends Archivist Meetings:   Marland Kitchen Marital Status:   Intimate Partner Violence:   .  Fear of Current or Ex-Partner:   . Emotionally Abused:   Marland Kitchen Physically Abused:   . Sexually Abused:       Tobacco Use: Medium Risk  . Smoking Tobacco Use: Former Smoker  . Smokeless Tobacco Use: Former Systems developer   Social History   Substance and Sexual Activity  Alcohol Use Yes  . Alcohol/week: 0.0 standard drinks   Comment: Occassional    Family History  Problem Relation Age of Onset  . Hypertension Sister   . Lung cancer Father   . Heart failure Mother     Review of Systems  Constitutional: Negative for chills and fever.  HENT: Negative for congestion, sore throat and tinnitus.   Eyes: Negative for double vision, photophobia and pain.  Respiratory: Negative for cough, shortness of breath and wheezing.   Cardiovascular: Negative for chest pain, palpitations and orthopnea.  Gastrointestinal: Negative for heartburn, nausea and vomiting.  Genitourinary: Negative for dysuria, frequency and urgency.  Musculoskeletal: Positive for joint pain.  Neurological: Negative for dizziness, weakness and headaches.     Objective:  Physical Exam: Today this well-developed nourished gentleman in moderate distress with his left hip. Neurovascularly he is intact into the left hip but range of motion left hip is limited and painful.  Vital signs in last 24 hours: Temp:  [98.3 F (36.8 C)] 98.3 F (36.8 C) (08/04 1206) Pulse Rate:  [62] 62 (08/04 1206) Resp:  [16] 16 (08/04 1206) BP: (139)/(100) 139/100 (08/04 1206) SpO2:  [100 %] 100 % (08/04 1206) Weight:  [92 kg] 92 kg (08/04  1227)  Imaging Review X-rays of his hip are obtained today and show periprosthetic fracture with the lesser trochanter and medial thigh proximal femur off.  Assessment/Plan:  Assessment: Periprosthetic fracture left femur  Plan: Patient has presented to hospital for surgery this afternoon. Plan for open reduction internal fixation of periprosthetic fracture left femur with femoral revision. Risks and benefits were discussed with the patient and his wife and they elect to proceed.  Theresa Duty, PA-C Orthopedic Surgery [facility] [place]

## 2020-03-20 ENCOUNTER — Inpatient Hospital Stay (HOSPITAL_COMMUNITY): Payer: Medicare Other

## 2020-03-20 DIAGNOSIS — E785 Hyperlipidemia, unspecified: Secondary | ICD-10-CM

## 2020-03-20 DIAGNOSIS — I4891 Unspecified atrial fibrillation: Secondary | ICD-10-CM

## 2020-03-20 DIAGNOSIS — R7309 Other abnormal glucose: Secondary | ICD-10-CM | POA: Diagnosis not present

## 2020-03-20 DIAGNOSIS — I480 Paroxysmal atrial fibrillation: Secondary | ICD-10-CM

## 2020-03-20 DIAGNOSIS — I10 Essential (primary) hypertension: Secondary | ICD-10-CM

## 2020-03-20 LAB — ECHOCARDIOGRAM COMPLETE
Area-P 1/2: 4.68 cm2
Height: 73 in
S' Lateral: 4.5 cm
Weight: 3244.78 oz

## 2020-03-20 LAB — HEMOGLOBIN A1C
Hgb A1c MFr Bld: 6 % — ABNORMAL HIGH (ref 4.8–5.6)
Mean Plasma Glucose: 125.5 mg/dL

## 2020-03-20 LAB — CBC
HCT: 36.5 % — ABNORMAL LOW (ref 39.0–52.0)
Hemoglobin: 11.6 g/dL — ABNORMAL LOW (ref 13.0–17.0)
MCH: 31 pg (ref 26.0–34.0)
MCHC: 31.8 g/dL (ref 30.0–36.0)
MCV: 97.6 fL (ref 80.0–100.0)
Platelets: 167 10*3/uL (ref 150–400)
RBC: 3.74 MIL/uL — ABNORMAL LOW (ref 4.22–5.81)
RDW: 14.2 % (ref 11.5–15.5)
WBC: 10.5 10*3/uL (ref 4.0–10.5)
nRBC: 0 % (ref 0.0–0.2)

## 2020-03-20 MED ORDER — AMIODARONE HCL IN DEXTROSE 360-4.14 MG/200ML-% IV SOLN
60.0000 mg/h | INTRAVENOUS | Status: AC
  Administered 2020-03-20 (×2): 60 mg/h via INTRAVENOUS
  Filled 2020-03-20 (×2): qty 200

## 2020-03-20 MED ORDER — AMIODARONE HCL IN DEXTROSE 360-4.14 MG/200ML-% IV SOLN
30.0000 mg/h | INTRAVENOUS | Status: DC
  Administered 2020-03-20 – 2020-03-22 (×4): 30 mg/h via INTRAVENOUS
  Filled 2020-03-20 (×6): qty 200

## 2020-03-20 MED ORDER — CHLORHEXIDINE GLUCONATE CLOTH 2 % EX PADS: 6.0000 | MEDICATED_PAD | Freq: Every day | CUTANEOUS | Status: DC

## 2020-03-20 MED ORDER — AMIODARONE LOAD VIA INFUSION
150.0000 mg | Freq: Once | INTRAVENOUS | Status: AC
  Administered 2020-03-20: 150 mg via INTRAVENOUS
  Filled 2020-03-20: qty 83.34

## 2020-03-20 NOTE — Progress Notes (Signed)
Subjective: 1 Day Post-Op Procedure(s) (LRB): OPEN REDUCTION INTERNAL FIXATION (ORIF) PERIPROSTHETIC FRACTURE LEFT FEMUR WITH FEMORAL REVISION (Left) Patient reports pain as mild.   Patient seen in rounds with Dr. Wynelle Link. Patient is well, and has had no acute complaints or problems. Reports preoperative pain is much improved. Denies chest pain, palpitations or SOB. We will continue begin today.   Objective: Vital signs in last 24 hours: Temp:  [97.7 F (36.5 C)-98.3 F (36.8 C)] 97.9 F (36.6 C) (08/05 0545) Pulse Rate:  [62-110] 95 (08/05 0545) Resp:  [7-21] 20 (08/05 0545) BP: (95-139)/(74-100) 121/93 (08/05 0545) SpO2:  [99 %-100 %] 99 % (08/05 0743) Weight:  [92 kg] 92 kg (08/04 1227)  Intake/Output from previous day:  Intake/Output Summary (Last 24 hours) at 03/20/2020 0803 Last data filed at 03/20/2020 0743 Gross per 24 hour  Intake 3820 ml  Output 4050 ml  Net -230 ml     Intake/Output this shift: Total I/O In: -  Out: 300 [Urine:300]  Labs: Recent Labs    03/19/20 1218 03/20/20 0537  HGB 13.4 11.6*   Recent Labs    03/19/20 1218 03/20/20 0537  WBC 13.1* 10.5  RBC 4.35 3.74*  HCT 41.7 36.5*  PLT 206 167   Recent Labs    03/19/20 1218  NA 137  K 4.9  CL 102  CO2 22  BUN 22  CREATININE 1.17  GLUCOSE 137*  CALCIUM 8.9   No results for input(s): LABPT, INR in the last 72 hours.  Exam: General - Patient is Alert and Oriented Extremity - Neurologically intact Neurovascular intact Sensation intact distally Dorsiflexion/Plantar flexion intact Dressing - dressing C/D/I Motor Function - intact, moving foot and toes well on exam.   Past Medical History:  Diagnosis Date  . Arthritis   . Atherosclerotic heart disease of native coronary artery without angina pectoris   . Cataract   . Coronary atherosclerosis of native coronary artery    Multivessel status post CABG March 2015  . DDD (degenerative disc disease), lumbar    Dr. Carloyn Manner  .  Essential hypertension, benign   . GERD (gastroesophageal reflux disease)   . Hard of hearing   . Hemorrhoids   . History of gout   . History of kidney stones   . History of [facility] spotted fever   . Hyperlipidemia, unspecified   . Hypertensive heart disease without heart failure   . Kidney stones   . Lumbago   . Lumbago   . Melena   . Mixed hyperlipidemia   . 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 Personal history of other diseases of the musculoskeletal system and connective tissue   . Plantar fascial fibromatosis   . Pleurodynia   . Renal calculus or stone 05/22/14  . Stress fracture 2021   left hip  . Unspecified abdominal pain   . Unspecified osteoarthritis, unspecified site     Assessment/Plan: 1 Day Post-Op Procedure(s) (LRB): OPEN REDUCTION INTERNAL FIXATION (ORIF) PERIPROSTHETIC FRACTURE LEFT FEMUR WITH FEMORAL REVISION (Left) Active Problems:   Periprosthetic fracture around internal prosthetic left hip joint (HCC)  Estimated body mass index is 26.76 kg/m as calculated from the following:   Height as of this encounter: 6\' 1"  (1.854 m).   Weight as of this encounter: 92 kg. Advance diet Up with therapy  DVT Prophylaxis - Xarelto Weight bearing as tolerated. Posterior hip precautions. Begin therapy. May D/C knee immobilizer.  Plan is to go Home after hospital stay. Hopeful for discharge tomorrow  if cleared by cardiology. Very appreciative of their assistance.  Theresa Duty, PA-C Orthopedic Surgery [phone] 03/20/2020, 8:03 AM

## 2020-03-20 NOTE — Op Note (Signed)
NAME: RAYMONE, [place] MEDICAL RECORD [id number] ACCOUNT [id number] DATE OF BIRTH:18-Sep-1945 FACILITY: [facility] LOCATION: [id number] PHYSICIAN:Anevay Campanella Zella Ball, MD  OPERATIVE REPORT  DATE OF PROCEDURE:  03/19/2020  PREOPERATIVE DIAGNOSIS:  Left periprosthetic femur fracture.  POSTOPERATIVE DIAGNOSIS:  Left periprosthetic femur fracture.  PROCEDURE:  Open reduction internal fixation left periprosthetic femur fracture with femoral component revision.  SURGEON:  Gaynelle Arabian, MD  ASSISTANT:  Theresa Duty, PA-C  ANESTHESIA:  General.  ESTIMATED BLOOD LOSS:  200 mL.  DRAINS:  Hemovac x1.  COMPLICATIONS:  None.  CONDITION:  Stable to recovery.  BRIEF CLINICAL NOTE:  The patient is a 74 year old male who had a total hip arthroplasty done over a month ago.  He did great initially for the first 2 weeks then started to develop increased pain.  He had a hematoma, which was drained, but that did not  improve his pain.  MRI showed a stress reaction of the femur, so we placed him on protected weightbearing.  He had a major increase in pain and was seen in the office today and noted to have a periprosthetic femur fracture with subsidence of the femoral  stem.  He was admitted to the hospital today and presents today for the above-mentioned procedure.  PROCEDURE IN DETAIL After successful administration of general anesthetic, the patient was placed in the right lateral decubitus position with the left side up and held with a hip positioner.  Left lower extremity was isolated from his perineum with  plastic drapes and prepped and draped in the usual sterile fashion.  A long posterolateral incision was made with a 10 blade through subcutaneous tissue to the fascia lata, which was incised in line with the skin incision.  Fascia of the vastus lateralis  was incised and then the muscle was elevated off the posterior intermuscular septum to gain access to the femur.  I was able to palpate the  fracture, which is already partially healed.  It was partially healed distally, but proximally it was unstable.   It was a spiral fracture, which came within a centimeter from the tip of the stem.  I then exposed the joint by elevating the posterior soft tissue structures off the femur and entered the joint.  I dislocated the hip and removed the femoral head.  The  stem was unstable and it was removed.  We then reduced the fracture proximally past 3 Zimmer cables and tightened them up to anatomically reduce the fracture proximally.  Again, there was some healing already distally.  We then accessed the femoral canal and started reaming for the solution stem.  We started with the straight reamers at 8 mm, reamed up to 16 mm for a 16.5 stem.  I then broached starting at 11 all the way up to 16.5 and we had excellent purchase with a  16.5.  We trialed with the high offset neck with a large stature body and had to go up to a 32+9 head to reapproximate his leg lengths and to gain appropriate stability.  When the hip was reduced at 32+9 he had full extension, full external rotation, 70  degrees flexion, 40 degrees adduction, and 70 degrees internal rotation, and 90 degrees of flexion, 70 degrees of internal rotation.  By placing the left leg on top of the right I felt as though the leg lengths were equal.  The hip was then dislocated  and trials were removed.  The permanent solution, straight 8 inch 16.5 stem with the large stature body  was then impacted into the femur carefully to the appropriate resection level.  It was done in about 20 degrees of anteversion.  The +9 head is  placed, ceramic +9 head 32 mm, and is put onto the stem and then the hip was reduced with the same stability parameters.  We took an x-ray, AP and lateral of the femur showing that the femur was placed centrally in the canal with no evidence of  periprosthetic fracture.  The wound was then copiously irrigated with saline solution.  Short  rotators were reattached to the femur through drill holes with Ethibond suture.  Fascia of the vastus lateralis was closed with a running #1 Vicryl.  Fascia  lata was closed with a running 0 Stratafix.  Subcutaneous closed with interrupted 2-0 Vicryl and subcuticular running 4-0 Monocryl.  Incisions cleaned and dried.  Steri-Strips and a bulky sterile dressing applied.  He was placed into a knee immobilizer,  awakened and transported to recovery in stable condition.  Note that a surgical assistant was of medical necessity for this procedure to do it in a safe and expeditious manner.  Surgical assistance necessary for retraction of vital neurovascular structures and for proper positioning of the limb, for safe removal  of the old implant, for accurate reduction of the fracture, and for safe and accurate placement of the new implant.  CN/NUANCE  D:03/19/2020 T:03/20/2020 JOB:[id number]

## 2020-03-20 NOTE — Consult Note (Signed)
Cardiology Consultation:   Patient ID: Brandon [name]; [id number]; 09/27/1945   Admit date: 03/19/2020 Date of Consult: 03/20/2020  Primary Care Provider: Sharilyn Sites, MD Primary Cardiologist: Brandon Grooms, MD Primary Electrophysiologist:  None   Patient Profile:   Brandon [name] is a 74 y.o. male with a PMH of CAD s/p CABG in 2015, HTN, HLD, Myasthenia Gravis, GERD, BPH, and gout, who is being seen today for the evaluation of new onset atrial fibrillation at the request of Brandon [name].  History of Present Illness:   Brandon [name] presented for planned hip replacement surgery 03/19/20. His operative course was complicated by new onset atrial fibrillation with RVR with HR's in the 100s-110s. Afib confirmed on EKG 03/19/20 with rate 108 bpm, with non-specific ST abnormalities but no significant STE/D. He was started on metoprolol tartrate 25mg  BID for rate control with plans to start anticoagulation once cleared by Ortho. Cardiology asked to evaluate patient for new onset atrial fibrillation.  He was last seen by cardiology at an outpatient visit with Brandon [name] 01/2020 for preoperative assessment for upcoming hip replacement, for which he is currently admitted post-operatively. He was doing well from a cardiovascular standpoint - he was able to complete 4 METs without anginal complaints. He was recommended to continue follow-up with Brandon [name] as scheduled going forward. His last ischemic evaluation was a LHC prior to his CABG in 2015 which showed severe multivessel disease. His last echocardiogram in 2015 showed EF 60-65%, G1DD, no RWMA, mild LAE, and no significant valvular abnormalities.   At the time of this evaluation the patient is sitting upright comfortably in bed with no complaints. He is unaware of his atrial fibrillation and has not appreciated any palpitations or racing heart beat sensations. He has no complaints of dizziness, lightheadedness, syncope, chest pain, or SOB. He  has no know history of atrial fibrillation. It appears he was previously on metoprolol, however this was discontinued due to bradycardia during a hospitalization 10/2019 for acute exacerbation of his myasthenia gravis where he received a 5 day course of IVIG. Prior to this hospitalization he had no anginal complaints or memorable episodes of palpitations, dizziness, lightheadedness, or syncope. He does report some recent hematochezia - mostly blood on the tissue and in toilet bowl following hard stools. He has been following with a GI provider for bloating and constipation issues and was initially planned for a colonoscopy, however this was deferred until after his hip surgery due to inability to maintain colonoscopy position due to pain. He was encouraged to follow-up with GI shortly after his hospitalization. No complaints of hematemesis or melena.     Past Medical History:  Diagnosis Date  . Arthritis   . Atherosclerotic heart disease of native coronary artery without angina pectoris   . Cataract   . Coronary atherosclerosis of native coronary artery    Multivessel status post CABG March 2015  . DDD (degenerative disc disease), lumbar    Brandon [name]  . Essential hypertension, benign   . GERD (gastroesophageal reflux disease)   . Hard of hearing   . Hemorrhoids   . History of gout   . History of kidney stones   . History of Brandon [name]   . Hyperlipidemia, unspecified   . Hypertensive heart disease without heart failure   . Kidney stones   . Lumbago   . Lumbago   . Melena   . Mixed hyperlipidemia   . Ocular myasthenia (Brandon [name])   . Personal history  of other diseases of the musculoskeletal system and connective tissue   . Plantar fascial fibromatosis   . Pleurodynia   . Renal calculus or stone 05/22/14  . Stress fracture 2021   left hip  . Unspecified abdominal pain   . Unspecified osteoarthritis, unspecified site     Past Surgical History:  Procedure Laterality Date  .  AMPUTATION Right 03/27/2014   Procedure: DEBRIDEMENT AND CLOSURE RIGHT INDEX FINGER;  Surgeon: Brandon Hoff, MD;  Location: [facility];  Service: Orthopedics;  Laterality: Right;  . BACK SURGERY    . BICEPT TENODESIS  11/26/2011   Procedure: BICEPT TENODESIS;  Surgeon: Brandon Schooling, MD;  Location: [facility];  Service: Orthopedics;  Laterality: Left;  . BIOPSY  08/03/2018   Procedure: BIOPSY;  Surgeon: Brandon Houston, MD;  [facility];  Service: Endoscopy;;  antral  . Cataract surgery Left   . CHOLECYSTECTOMY N/A 09/22/2018   Procedure: LAPAROSCOPIC CHOLECYSTECTOMY;  Surgeon: Brandon Cagey, MD;  Location: [facility];  Service: General;  Laterality: N/A;  . COLONOSCOPY    . COLONOSCOPY N/A 12/26/2014   Procedure: COLONOSCOPY;  Surgeon: Brandon Houston, MD;  [facility];  Service: Endoscopy;  Laterality: N/A;  730  . CORONARY ARTERY BYPASS GRAFT N/A 10/19/2013   Procedure: CORONARY ARTERY BYPASS GRAFTING (CABG);  Surgeon: Brandon Alberts, MD;  Location: [facility];  Service: Open Heart Surgery;  Laterality: N/A;  CABG times four using left internal mammary artery and left leg saphenous vein, incision made on right leg but no vein removed  . CYSTOSCOPY WITH RETROGRADE PYELOGRAM, URETEROSCOPY AND STENT PLACEMENT Left 08/24/2016   Procedure: CYSTOSCOPY WITH LEFT RETROGRADE PYELOGRAM, LEFT URETEROSCOPY, BASKET EXTRACTION LEFT URETERAL STONE;  Surgeon: Brandon Seal, MD;  Location: [facility];  Service: Urology;  Laterality: Left;  . ESOPHAGOGASTRODUODENOSCOPY N/A 08/03/2018   Procedure: ESOPHAGOGASTRODUODENOSCOPY (EGD);  Surgeon: Brandon Houston, MD;  [facility];  Service: Endoscopy;  Laterality: N/A;  2:55  . EXTRACORPOREAL SHOCK WAVE LITHOTRIPSY Right 06/18/2019   Procedure: EXTRACORPOREAL SHOCK WAVE LITHOTRIPSY (ESWL);  Surgeon: Brandon Mallow, MD;  Location: [facility];  Service: Urology;  Laterality: Right;  . INTRAOPERATIVE TRANSESOPHAGEAL ECHOCARDIOGRAM N/A 10/19/2013   Procedure:  INTRAOPERATIVE TRANSESOPHAGEAL ECHOCARDIOGRAM;  Surgeon: Brandon Alberts, MD;  Location: [facility];  Service: Open Heart Surgery;  Laterality: N/A;  . JOINT REPLACEMENT    . LEFT HEART CATHETERIZATION WITH CORONARY ANGIOGRAM N/A 10/09/2013   Procedure: LEFT HEART CATHETERIZATION WITH CORONARY ANGIOGRAM;  Surgeon: Burnell Blanks, MD;  [facility];  Service: Cardiovascular;  Laterality: N/A;  . Left shoulder rotator cuff repair    . LITHOTRIPSY    . TOTAL HIP ARTHROPLASTY Left 01/28/2020   Procedure: TOTAL HIP ARTHROPLASTY ANTERIOR APPROACH;  Surgeon: Gaynelle Arabian, MD;  Location: [facility];  Service: Orthopedics;  Laterality: Left;  174min  . TOTAL KNEE ARTHROPLASTY Left   . traumaticvamputation of finger  2015  . VASECTOMY       Home Medications:  Prior to Admission medications   Medication Sig Start Date End Date Taking? Authorizing Provider  acetaminophen (TYLENOL) 500 MG tablet Take 1,000 mg by mouth daily.   Yes [provider]  allopurinol (ZYLOPRIM) 300 MG tablet Take 300 mg by mouth daily.   Yes [provider]  amLODipine (NORVASC) 5 MG tablet Take 1 tablet (5 mg total) by mouth daily. 11/11/19  Yes Shelly Coss, MD  aspirin EC 81 MG tablet Take  81 mg by mouth daily. Swallow whole.   Yes [provider]  Calcium 600-200 MG-UNIT tablet Take 1 tablet by mouth daily.   Yes [provider]  Multiple Vitamin (MULTIVITAMIN) tablet Take 1 tablet by mouth daily.   Yes [provider]  Omega-3 Fatty Acids (FISH OIL) 500 MG CAPS Take by mouth daily.   Yes [provider]  pantoprazole (PROTONIX) 40 MG tablet Take 1 tablet (40 mg total) by mouth daily. 11/10/19 11/09/20 Yes Shelly Coss, MD  pravastatin (PRAVACHOL) 40 MG tablet Take 40 mg by mouth every evening.    Yes [provider]  predniSONE (DELTASONE) 10 MG tablet Take as directed; prednisone tapering 03/18/20  Yes Penumalli, Earlean Polka, MD  pyridostigmine  (MESTINON) 60 MG tablet Take 1 tablet (60 mg total) by mouth 3 (three) times daily. 01/08/20  Yes Penumalli, Earlean Polka, MD  Simethicone (GAS FREE EXTRA STRENGTH PO) Take by mouth. After meals   Yes [provider]  tamsulosin (FLOMAX) 0.4 MG CAPS capsule Take 0.4 mg by mouth 2 (two) times daily.  07/12/16  Yes [provider]  UNABLE TO FIND Med Name: Osteo bioflex   Yes [provider]  hydrochlorothiazide (HYDRODIURIL) 50 MG tablet Take 50 mg by mouth daily.     [provider]  HYDROcodone-acetaminophen (NORCO/VICODIN) 5-325 MG tablet Take 1-2 tablets by mouth every 6 (six) hours as needed for severe pain. Patient not taking: Reported on 03/18/2020 01/29/20   Maurice March, PA-C  methocarbamol (ROBAXIN) 500 MG tablet Take 1 tablet (500 mg total) by mouth every 6 (six) hours as needed for muscle spasms. Patient not taking: Reported on 03/18/2020 01/29/20   Maurice March, PA-C  nitroGLYCERIN (NITROSTAT) 0.4 MG SL tablet Place 1 tablet (0.4 mg total) under the tongue every 5 (five) minutes as needed. 04/03/19   Satira Sark, MD  potassium chloride (K-DUR,KLOR-CON) 10 MEQ tablet Take 10 mEq by mouth daily. Patient not taking: Reported on 03/18/2020    [provider]  traMADol (ULTRAM) 50 MG tablet Take 1-2 tablets (50-100 mg total) by mouth every 6 (six) hours as needed for moderate pain. Patient not taking: Reported on 03/18/2020 01/29/20   Maurice March, PA-C    Inpatient Medications: Scheduled Meds: . allopurinol  150 mg Oral Daily  . amLODipine  5 mg Oral Daily  . Chlorhexidine Gluconate Cloth  6 each Topical Daily  . docusate sodium  100 mg Oral BID  . hydrochlorothiazide  50 mg Oral Daily  . metoprolol tartrate  25 mg Oral BID  . oxyCODONE      . pantoprazole  40 mg Oral Daily  . pravastatin  40 mg Oral QPM  . predniSONE  20 mg Oral BID WC  . pyridostigmine  60 mg Oral TID  . rivaroxaban  10 mg Oral Q breakfast  . tamsulosin  0.4 mg  Oral BID   Continuous Infusions: . sodium chloride 75 mL/hr at 03/19/20 2108  . methocarbamol (ROBAXIN) IV     PRN Meds: acetaminophen, bisacodyl, HYDROcodone-acetaminophen, HYDROcodone-acetaminophen, magnesium citrate, menthol-cetylpyridinium **OR** phenol, methocarbamol **OR** methocarbamol (ROBAXIN) IV, metoCLOPramide **OR** metoCLOPramide (REGLAN) injection, morphine injection, ondansetron **OR** ondansetron (ZOFRAN) IV, polyethylene glycol, traMADol  Allergies:   No Known Allergies  Social History:   Social History   Socioeconomic History  . Marital status: Married    Spouse name: [name]  . Number of children: 2  . Years of education: Not on file  . Highest education level:  10th grade  Occupational History  . Occupation: lumber mills  Tobacco Use  . Smoking status: Former Smoker    Packs/day: 2.00    Years: 30.00    Pack years: 60.00    Types: Cigarettes    Start date: 08/16/1958    Quit date: 08/17/1983    Years since quitting: 36.6  . Smokeless tobacco: Former Systems developer    Types: Chew  . Tobacco comment: quit smoking 67yrs ago  Vaping Use  . Vaping Use: Never used  Substance and Sexual Activity  . Alcohol use: Yes    Alcohol/week: 0.0 standard drinks    Comment: Occassional  . Drug use: No  . Sexual activity: Yes  Other Topics Concern  . Not on file  Social History Narrative   Lives with wife   Caffeine- coffee 3- 4 c daily   Social Determinants of Health   Financial Resource Strain:   . Difficulty of Paying Living Expenses:   Food Insecurity:   . Worried About Charity fundraiser in the Last Year:   . Arboriculturist in the Last Year:   Transportation Needs:   . Film/video editor (Medical):   Marland Kitchen [name] of Transportation (Non-Medical):   Physical Activity:   . Days of Exercise per Week:   . Minutes of Exercise per Session:   Stress:   . Feeling of Stress :   Social Connections:   . Frequency of Communication with Friends and Family:   . Frequency of  Social Gatherings with Friends and Family:   . Attends Religious Services:   . Active Member of Clubs or Organizations:   . Attends Archivist Meetings:   Marland Kitchen Marital Status:   Intimate Partner Violence:   . Fear of Current or Ex-Partner:   . Emotionally Abused:   Marland Kitchen Physically Abused:   . Sexually Abused:     Family History:    Family History  Problem Relation Age of Onset  . Hypertension Sister   . Lung cancer Father   . Heart failure Mother      ROS:  Please see the history of present illness.  ROS  All other ROS reviewed and negative.     Physical Exam/Data:   Vitals:   03/19/20 2030 03/20/20 0205 03/20/20 0545 03/20/20 0743  BP: 117/89 95/75 (!) 121/93   Pulse: (!) 109 77 95   Resp: 16 19 20    Temp: 97.8 F (36.6 C) 97.7 F (36.5 C) 97.9 F (36.6 C)   TempSrc:  Oral Oral   SpO2: 100% 99% 99% 99%  Weight:      Height:        Intake/Output Summary (Last 24 hours) at 03/20/2020 0752 Last data filed at 03/20/2020 0743 Gross per 24 hour  Intake 3820 ml  Output 4050 ml  Net -230 ml   Filed Weights   03/19/20 1227  Weight: 92 kg   Body mass index is 26.76 kg/m.  General:  Well nourished, well developed, in no acute distress HEENT: sclera anicteric  Neck: no JVD Vascular: No carotid bruits; distal pulses 2+ bilaterally Cardiac:  normal S1, S2; IRIR; no murmurs, rubs, or gallops Lungs:  clear to auscultation bilaterally, no wheezing, rhonchi or rales  Abd: NABS, soft, obese, nontender, no hepatomegaly Ext: no edema Musculoskeletal:  No deformities, BUE and BLE strength normal and equal Skin: warm and dry  Neuro:  CNs 2-12 intact, no focal abnormalities noted Psych:  Normal affect   EKG:  The EKG was personally reviewed and demonstrates:  Atrial fibrillation with RVR, rate 108 bpm, with non-specific ST abnormalities but no significant STE/D. Telemetry:  Telemetry was personally reviewed and demonstrates:  Atrial fibrillation with rates varying from  90s-140s at times. Currently in the 110s-120s  Relevant CV Studies: Echocardiogram 09/2013: - Left ventricle: Normal left ventricular outflow tract  gradient. Wall thickness was increased in a pattern of  severe LVH. Systolic function was normal. The estimated  ejection fraction was in the range of 60% to 65%. Wall  motion was normal; there were no regional wall motion  abnormalities. There was an increased relative  contribution of atrial contraction to ventricular filling.  Doppler parameters are consistent with abnormal left  ventricular relaxation (grade 1 diastolic dysfunction).  There was no evidence of elevated ventricular filling  pressure by Doppler parameters.  - Aortic valve: Trileaflet; mildly thickened leaflets. There  was no stenosis.  - Mitral valve: No systolic motion of the anterior mitral  leaflet was noted. A small torn chord was appreciated,  with no evidence of mitral regurgitation. Calcified  annulus.  - Left atrium: The atrium was mildly dilated.    Laboratory Data:  Chemistry Recent Labs  Lab 03/19/20 1218  NA 137  K 4.9  CL 102  CO2 22  GLUCOSE 137*  BUN 22  CREATININE 1.17  CALCIUM 8.9  GFRNONAA >60  GFRAA >60  ANIONGAP 13    Recent Labs  Lab 03/19/20 1218  PROT 6.4*  ALBUMIN 3.9  AST 45*  ALT 34  ALKPHOS 80  BILITOT 0.8   Hematology Recent Labs  Lab 03/19/20 1218 03/20/20 0537  WBC 13.1* 10.5  RBC 4.35 3.74*  HGB 13.4 11.6*  HCT 41.7 36.5*  MCV 95.9 97.6  MCH 30.8 31.0  MCHC 32.1 31.8  RDW 14.3 14.2  PLT 206 167   Cardiac EnzymesNo results for input(s): TROPONINI in the last 168 hours. No results for input(s): TROPIPOC in the last 168 hours.  BNPNo results for input(s): BNP, PROBNP in the last 168 hours.  DDimer No results for input(s): DDIMER in the last 168 hours.  Radiology/Studies:  DG Pelvis Portable  Result Date: 03/19/2020 CLINICAL DATA:  Post ORIF of LEFT periprosthetic femur  fracture EXAM: PORTABLE PELVIS 1-2 VIEWS COMPARISON:  January 28, 2020 FINDINGS: Post revision of LEFT hip arthroplasty, femoral component. Revision of femoral component is associated with cerclage wires about a fracture that extends along the medial and posterior aspect of the proximal femur. Three cerclage wires are in place. Alignment is near anatomic. Distal aspect without signs of fracture with the new, longer femoral component in place. Tip of this femoral component is visualized on the femoral x-rays of March 19, 2020 which were acquired concurrently and reported separately. IMPRESSION: 1. Post revision of LEFT hip arthroplasty, femoral component. 2. Signs of periprosthetic fracture secured by cerclage wires and revision of femoral component with longer femoral component now in place. Expected postoperative changes with near anatomic alignment. Electronically Signed   By: Zetta Bills M.D.   On: 03/19/2020 19:21   DG HIP UNILAT WITH PELVIS 2-3 VIEWS LEFT  Result Date: 03/19/2020 CLINICAL DATA:  74 year old male with concern for hip fracture. EXAM: DG HIP (WITH OR WITHOUT PELVIS) 2-3V LEFT COMPARISON:  Pelvic radiograph dated 01/28/2020. FINDINGS: Partially visualized left hip arthroplasty. The visualized arthroplasty component appears intact. Partially visualized fracture of the proximal femur with cerclage wire, age indeterminate. Evaluation is limited due to partial visualization. This is however  new since the radiograph of 01/28/2020. A the soft tissues are grossly unremarkable. Vascular calcifications noted. IMPRESSION: Partially visualized fracture of the proximal femur, age indeterminate. Dedicated left hip radiograph may provide better evaluation. Electronically Signed   By: Anner Crete M.D.   On: 03/19/2020 19:07    Assessment and Plan:    1. New onset atrial fibrillation: noted intraoperatively during hip replacement surgery 03/19/20. No prior history of this. Rates persistently in the  110s-120s despite addition of metoprolol 25mg  BID. Cannot exclude paroxysmal atrial fibrillation in the past as he is asymptomatic. Likely driven by stress of surgery.   - Will start amiodarone gtt for rate/rhythm control - Will update an echocardiogram - Will add on HgbA1C to AM labs for risk stratification - he was prediabetic on last check in 2015. - Will check TSH in AM - This patients CHA2DS2-VASc Score and unadjusted Ischemic Stroke Rate (% per year) is equal to at least 3.2 % stroke rate/year from a score of 3 Above score calculated as 1 point each if present [CHF, HTN, DM, Vascular=MI/PAD/Aortic Plaque, Age if 65-74, or Male] Above score calculated as 2 points each if present [Age > 75, or Stroke/TIA/TE] - He would benefit from anticoagulation going forward - recommend eliquis 5mg  BID once cleared to start by ortho - Continue metoprolol tartrate 25 mg BID for rate control - monitor closely for conversion to sinus rhythm as his underlying rhythm is sinus bradycardia  2. CAD s/p CABG: no anginal complaints. No ischemic changes on EKG.  - Can stop aspirin given need for anticoagulation for management of #1 - Continue statin  3. HLD: LDL 62 06/2019; at goal of <70 - Continue pravastatin  4. HTN: BP on the soft side - Continue amlodipine, metoprolol tartrate, and HCTZ as tolerated  5. Myasthenia Gravis: on prednisone and pyridostigmine - Monitor for bradycardia with addition of metoprolol to pyridostigmine regimen  6. Elevated blood sugars: suspicions are high for steroid induced DM type 2. A1C was in the prediabetic range in 2015. - Will add on A1C   7. Hematochezia: suspect hemorrhoid related rectal bleeding. No melena or hematemesis complaints.  - Close monitoring of Hgb with initiation of anticoagulation - Encouraged close GI follow-up to determine source of bleeding.   For questions or updates, please contact [place] Please consult [email or web address] for contact info under  Cardiology/STEMI.   Signed, Abigail Butts, PA-C  03/20/2020 7:52 AM [phone]

## 2020-03-20 NOTE — Progress Notes (Signed)
  Echocardiogram 2D Echocardiogram has been performed.  Brandon [name] 03/20/2020, 3:08 PM

## 2020-03-20 NOTE — Evaluation (Addendum)
Physical Therapy Evaluation Patient Details Name: Brandon [name] MRN: [id number] DOB: [birth year] Today's Date: 03/20/2020   History of Present Illness  Brandon [name], 74 y.o. male, is status post left total hip arthroplasty, direct anterior approach for  a femoral neck fracture on 01/28/20 . Marland Kitchen Had an increase in pain four weeks out from surgery. MRI was obtained of the hip which showed a hematoma and questionable stress fracture. Hematoma was aspirated in the office and patient was ordered TDWB due to questionable fracture. Patient followed up in two weeks, and noted that his pain with slight movement had worsened. XRs were obtained which demonstrated a periprosthetic fracture. S/P  Open reduction internal fixation left periprosthetic femur fracture with femoral component revision.  Onset of afib with RVR intraop.  IFO:YDXA, CABG, LTKA,occular myasthenia  Clinical Impression  The patient ambulated x 70 ', rested x 3 standing, noted 2-3/4 dyspnea. HR 109-126. Patient should progress to Dc home.Pt admitted with above diagnosis.  Pt currently with functional limitations due to the deficits listed below (see PT Problem List). Pt will benefit from skilled PT to increase their independence and safety with mobility to allow discharge to the venue listed below.    Patient reports left hip/thigh are very sore.      Follow Up Recommendations Home health PT;Supervision/Assistance - 24 hour    Equipment Recommendations  None recommended by PT    Recommendations for Other Services   OT    Precautions / Restrictions Precautions Precautions: Fall Precaution Comments: onset afib/RVR intra op. monitor HR./sats Restrictions Weight Bearing Restrictions: No LLE Weight Bearing: Weight bear as tolerated    Mobility  Bed Mobility Overal bed mobility: Needs Assistance Bed Mobility: Supine to Sit     Supine to sit: Min assist;HOB elevated     General bed mobility comments: min assist for left  leg  Transfers Overall transfer level: Needs assistance Equipment used: Rolling walker (2 wheeled) Transfers: Sit to/from Stand Sit to Stand: Min assist         General transfer comment: cues for safety, hand and left leg position  Ambulation/Gait Ambulation/Gait assistance: Min assist Gait Distance (Feet): 70 Feet Assistive device: Rolling walker (2 wheeled) Gait Pattern/deviations: Step-to pattern;Step-through pattern;Decreased stance time - left;Decreased step length - left Gait velocity: decr   General Gait Details: required 3 stand rest breaks, HR fro 110 to 126  Stairs            Wheelchair Mobility    Modified [place]in (Stroke Patients Only)       Balance Overall balance assessment: Needs assistance Sitting-balance support: No upper extremity supported;Feet supported Sitting balance-Leahy Scale: Good     Standing balance support: Bilateral upper extremity supported;During functional activity Standing balance-Leahy Scale: Fair Standing balance comment: reliant on UE                             Pertinent Vitals/Pain Pain Assessment: Faces Faces Pain Scale: Hurts even more Pain Location: left hip, Pain Descriptors / Indicators: Sore Pain Intervention(s): Monitored during session;Premedicated before session;Ice applied    Home Living Family/patient expects to be discharged to:: Private residence   Available Help at Discharge: Family Type of Home: House Home Access: Stairs to enter   Technical brewer of Steps: 1 Home Layout: One level Home Equipment: Gilford Rile - 2 wheels;Crutches      Prior Function Level of Independence: Needs assistance   Gait / Transfers Assistance Needed: required use  of RW           Hand Dominance        Extremity/Trunk Assessment   Upper Extremity Assessment Upper Extremity Assessment: Overall WFL for tasks assessed    Lower Extremity Assessment Lower Extremity Assessment: LLE  deficits/detail LLE Deficits / Details: able to flex partially in supine, slide to bed edge    Cervical / Trunk Assessment Cervical / Trunk Assessment: Normal  Communication   Communication: HOH  Cognition Arousal/Alertness: Awake/alert Behavior During Therapy: WFL for tasks assessed/performed Overall Cognitive Status: Within Functional Limits for tasks assessed                                        General Comments      Exercises     Assessment/Plan    PT Assessment Patient needs continued PT services  PT Problem List Decreased strength;Decreased mobility;Decreased knowledge of precautions;Decreased activity tolerance;Cardiopulmonary status limiting activity;Decreased balance;Pain       PT Treatment Interventions DME instruction;Therapeutic activities;Gait training;Therapeutic exercise;Patient/family education;Stair training;Functional mobility training    PT Goals (Current goals can be found in the Care Plan section)  Acute Rehab PT Goals Patient Stated Goal: to go home, go fishing PT Goal Formulation: With patient/family Time For Goal Achievement: 04/03/20 Potential to Achieve Goals: Good    Frequency Min 6X/week   Barriers to discharge        Co-evaluation               AM-PAC PT "6 Clicks" Mobility  Outcome Measure Help needed turning from your back to your side while in a flat bed without using bedrails?: A Little Help needed moving from lying on your back to sitting on the side of a flat bed without using bedrails?: A Little Help needed moving to and from a bed to a chair (including a wheelchair)?: A Little Help needed standing up from a chair using your arms (e.g., wheelchair or bedside chair)?: A Little Help needed to walk in hospital room?: A Little Help needed climbing 3-5 steps with a railing? : A Lot 6 Click Score: 17    End of Session Equipment Utilized During Treatment: Gait belt Activity Tolerance: Patient tolerated  treatment well Patient left: in chair;with call bell/phone within reach;with family/visitor present Nurse Communication: Mobility status PT Visit Diagnosis: Unsteadiness on feet (R26.81);Pain Pain - Right/Left: Left Pain - part of body: Hip    Time: 6759-1638 PT Time Calculation (min) (ACUTE ONLY): 35 min   Charges:   PT Evaluation $PT Eval Low Complexity: 1 Low PT Treatments $Gait Training: 8-22 mins        Tresa Endo PT Acute Rehabilitation Services Pager [phone] Office [phone]   Claretha Cooper 03/20/2020, 2:41 PM

## 2020-03-20 NOTE — TOC Initial Note (Signed)
Transition of Care [facility]) - Initial/Assessment Note    Patient Details  Name: Philopater Mucha MRN: [id number] Date of Birth: [birth year]  [facility]) CM/SW Contact:    Dessa Phi, RN Phone Number: 03/20/2020, 12:19 PM  Clinical Narrative: Patient/spouse d/c plans-home;has had procedure in past & HEP was the d/c plan they are hopeful for HEP again.Will follow for d/c plans.                  Expected Discharge Plan: Home/Self Care Barriers to Discharge: Continued Medical Work up   Patient Goals and CMS Choice Patient states their goals for this hospitalization and ongoing recovery are:: go home   Choice offered to / list presented to : Patient  Expected Discharge Plan and Services Expected Discharge Plan: Home/Self Care   Discharge Planning Services: CM Consult Post Acute Care Choice: NA Living arrangements for the past 2 months: Single Family Home                                      Prior Living Arrangements/Services Living arrangements for the past 2 months: Single Family Home Lives with:: Spouse   Do you feel safe going back to the place where you live?: Yes      Need for Family Participation in Patient Care: No (Comment) Care giver support system in place?: Yes (comment) Current home services: DME (rw,w/c,3n1) Criminal Activity/Legal Involvement Pertinent to Current Situation/Hospitalization: No - Comment as needed  Activities of Daily Living Home Assistive Devices/Equipment: Eyeglasses, Environmental consultant (specify type) ADL Screening (condition at time of admission) Patient's cognitive ability adequate to safely complete daily activities?: Yes Is the patient deaf or have difficulty hearing?: Yes Does the patient have difficulty seeing, even when wearing glasses/contacts?: No Does the patient have difficulty concentrating, remembering, or making decisions?: No Patient able to express need for assistance with ADLs?: Yes Does the patient have difficulty  dressing or bathing?: No Independently performs ADLs?: Yes (appropriate for developmental age) Does the patient have difficulty walking or climbing stairs?: No Weakness of Legs: Left Weakness of Arms/Hands: None  Permission Sought/Granted   Permission granted to share information with : Yes, Verbal Permission Granted  Share Information with NAME: Case Manager     Permission granted to share info w Relationship: [name] spouse [phone]     Emotional Assessment Appearance:: Appears stated age Attitude/Demeanor/Rapport: Gracious Affect (typically observed): Accepting Orientation: : Oriented to Self, Oriented to Place, Oriented to  Time, Oriented to Situation Alcohol / Substance Use: Not Applicable Psych Involvement: No (comment)  Admission diagnosis:  Periprosthetic fracture around internal prosthetic left hip joint ([place]) [O96.02XA] Patient Active Problem List   Diagnosis Date Noted  . Atrial fibrillation with RVR ([place])   . Periprosthetic fracture around internal prosthetic left hip joint ([place]) 03/19/2020  . OA (osteoarthritis) of hip 01/28/2020  . Closed displaced fracture of left femoral neck ([place]) 01/28/2020  . History of colonic polyps 11/12/2019  . GERD (gastroesophageal reflux disease) 11/06/2019  . Myasthenia gravis with (acute) exacerbation ([place]) 11/06/2019  . Acute diarrhea 09/17/2019  . Heme positive stool 09/17/2019  . Unspecified osteoarthritis, unspecified site   . Hyperlipidemia, unspecified   . Personal history of other diseases of the musculoskeletal system and connective tissue   . Atherosclerotic heart disease of native coronary artery without angina pectoris   . Hypertensive heart disease without heart failure   . Melena   .  Plantar fascial fibromatosis   . Pleurodynia   . Calculus of gallbladder without cholecystitis without obstruction 09/14/2018  . Nausea without vomiting 06/05/2018  . Postoperative atrial fibrillation ([place]) 11/09/2013  . S/P CABG x 4  10/19/2013  . Coronary atherosclerosis of native coronary artery 10/18/2013  . Essential hypertension, benign 09/18/2013  . Family history of hypertrophic cardiomyopathy 09/18/2013  . Mixed hyperlipidemia    PCP:  Sharilyn Sites, MD Pharmacy:   [place], [place] - [place] [address] [place] [address] [place] [address] Phone: [phone] Fax: [phone]     Social Determinants of Health (SDOH) Interventions    Readmission Risk Interventions No flowsheet data found.

## 2020-03-21 ENCOUNTER — Encounter (HOSPITAL_COMMUNITY): Payer: Self-pay | Admitting: Orthopedic Surgery

## 2020-03-21 LAB — CBC
HCT: 32.3 % — ABNORMAL LOW (ref 39.0–52.0)
Hemoglobin: 10.4 g/dL — ABNORMAL LOW (ref 13.0–17.0)
MCH: 31.3 pg (ref 26.0–34.0)
MCHC: 32.2 g/dL (ref 30.0–36.0)
MCV: 97.3 fL (ref 80.0–100.0)
Platelets: 189 10*3/uL (ref 150–400)
RBC: 3.32 MIL/uL — ABNORMAL LOW (ref 4.22–5.81)
RDW: 14.3 % (ref 11.5–15.5)
WBC: 11.8 10*3/uL — ABNORMAL HIGH (ref 4.0–10.5)
nRBC: 0 % (ref 0.0–0.2)

## 2020-03-21 LAB — TSH: TSH: 0.981 u[IU]/mL (ref 0.350–4.500)

## 2020-03-21 MED ORDER — AMIODARONE LOAD VIA INFUSION
150.0000 mg | Freq: Once | INTRAVENOUS | Status: AC
  Administered 2020-03-21: 150 mg via INTRAVENOUS
  Filled 2020-03-21: qty 83.34

## 2020-03-21 MED ORDER — AMIODARONE IV BOLUS ONLY 150 MG/100ML
150.0000 mg | Freq: Once | INTRAVENOUS | Status: DC
  Administered 2020-03-21: 150 mg via INTRAVENOUS
  Filled 2020-03-21: qty 100

## 2020-03-21 MED ORDER — APIXABAN 5 MG PO TABS
5.0000 mg | ORAL_TABLET | Freq: Two times a day (BID) | ORAL | Status: DC
  Administered 2020-03-21 – 2020-03-22 (×3): 5 mg via ORAL
  Filled 2020-03-21 (×3): qty 1

## 2020-03-21 NOTE — Progress Notes (Signed)
Progress Note  Patient Name: Brandon [name] Date of Encounter: 03/21/2020  Primary Cardiologist: Rozann Lesches, MD   Subjective   Feeling fine this morning. No complaints of palpitations, chest pain, or SOB. Hip pain is reasonably well controlled.   Inpatient Medications    Scheduled Meds: . allopurinol  150 mg Oral Daily  . amiodarone  150 mg Intravenous Once  . Chlorhexidine Gluconate Cloth  6 each Topical Daily  . docusate sodium  100 mg Oral BID  . metoprolol tartrate  25 mg Oral BID  . pantoprazole  40 mg Oral Daily  . pravastatin  40 mg Oral QPM  . pyridostigmine  60 mg Oral TID  . tamsulosin  0.4 mg Oral BID   Continuous Infusions: . sodium chloride 20 mL/hr at 03/20/20 2251  . amiodarone 30 mg/hr (03/20/20 2249)  . methocarbamol (ROBAXIN) IV     PRN Meds: acetaminophen, bisacodyl, HYDROcodone-acetaminophen, HYDROcodone-acetaminophen, magnesium citrate, menthol-cetylpyridinium **OR** phenol, methocarbamol **OR** methocarbamol (ROBAXIN) IV, metoCLOPramide **OR** metoCLOPramide (REGLAN) injection, morphine injection, ondansetron **OR** ondansetron (ZOFRAN) IV, polyethylene glycol, traMADol   Vital Signs    Vitals:   03/20/20 0743 03/20/20 1228 03/20/20 2004 03/21/20 0611  BP:  117/89 117/81 110/84  Pulse:  (!) 112 95 92  Resp:  20 (!) 22 18  Temp:  (!) 97.5 F (36.4 C) 98.4 F (36.9 C) 98.6 F (37 C)  TempSrc:  Oral Oral Oral  SpO2: 99% 98% 97% 99%  Weight:      Height:        Intake/Output Summary (Last 24 hours) at 03/21/2020 0826 Last data filed at 03/21/2020 0343 Gross per 24 hour  Intake 931.67 ml  Output 1450 ml  Net -518.33 ml   Filed Weights   03/19/20 1227  Weight: 92 kg    Telemetry    Continued atrial fibrillation with rates still in the 100s-120s this morning - Personally Reviewed  ECG    No new tracings - Personally Reviewed  Physical Exam   GEN: Sitting upright in bed eating breakfast in no acute distress.   Neck: No JVD,  no carotid bruits Cardiac: IRIR, no murmurs, rubs, or gallops.  Respiratory: Clear to auscultation bilaterally, no wheezes/ rales/ rhonchi GI: NABS, Soft, nontender, non-distended  MS: trace LLE edema; No deformity. Neuro:  Nonfocal, moving all extremities spontaneously Psych: Normal affect   Labs    Chemistry Recent Labs  Lab 03/19/20 1218  NA 137  K 4.9  CL 102  CO2 22  GLUCOSE 137*  BUN 22  CREATININE 1.17  CALCIUM 8.9  PROT 6.4*  ALBUMIN 3.9  AST 45*  ALT 34  ALKPHOS 80  BILITOT 0.8  GFRNONAA >60  GFRAA >60  ANIONGAP 13     Hematology Recent Labs  Lab 03/19/20 1218 03/20/20 0537 03/21/20 0505  WBC 13.1* 10.5 11.8*  RBC 4.35 3.74* 3.32*  HGB 13.4 11.6* 10.4*  HCT 41.7 36.5* 32.3*  MCV 95.9 97.6 97.3  MCH 30.8 31.0 31.3  MCHC 32.1 31.8 32.2  RDW 14.3 14.2 14.3  PLT 206 167 189    Cardiac EnzymesNo results for input(s): TROPONINI in the last 168 hours. No results for input(s): TROPIPOC in the last 168 hours.   BNPNo results for input(s): BNP, PROBNP in the last 168 hours.   DDimer No results for input(s): DDIMER in the last 168 hours.   Radiology    DG Pelvis Portable  Result Date: 03/19/2020 CLINICAL DATA:  Post ORIF of LEFT periprosthetic femur  fracture EXAM: PORTABLE PELVIS 1-2 VIEWS COMPARISON:  January 28, 2020 FINDINGS: Post revision of LEFT hip arthroplasty, femoral component. Revision of femoral component is associated with cerclage wires about a fracture that extends along the medial and posterior aspect of the proximal femur. Three cerclage wires are in place. Alignment is near anatomic. Distal aspect without signs of fracture with the new, longer femoral component in place. Tip of this femoral component is visualized on the femoral x-rays of March 19, 2020 which were acquired concurrently and reported separately. IMPRESSION: 1. Post revision of LEFT hip arthroplasty, femoral component. 2. Signs of periprosthetic fracture secured by cerclage wires  and revision of femoral component with longer femoral component now in place. Expected postoperative changes with near anatomic alignment. Electronically Signed   By: Zetta Bills M.D.   On: 03/19/2020 19:21   ECHOCARDIOGRAM COMPLETE  Result Date: 03/20/2020    ECHOCARDIOGRAM REPORT   Patient Name:   Brandon [name] Date of Exam: 03/20/2020 Medical Rec #:  [id number]          Height:       73.0 in Accession #:    [id number]         Weight:       202.8 lb Date of Birth:  [birth year]          BSA:          2.164 m Patient Age:    74 years           BP:           117/89 mmHg Patient Gender: M                  HR:           108 bpm. Exam Location:  Inpatient Procedure: 2D Echo, Cardiac Doppler and Color Doppler Indications:    I48.0 Paroxysmal atrial fibrillation  History:        Patient has prior history of Echocardiogram examinations, most                 recent 09/19/2013. Risk Factors:Hypertension, Dyslipidemia and                 GERD.  Sonographer:    Tiffany Dance Referring Phys: [id number] [place]  1. Left ventricular ejection fraction, by estimation, is 50 to 55%. The left ventricle has low normal function. The left ventricle has no regional wall motion abnormalities. There is mild left ventricular hypertrophy of the basal-septal segment. Left ventricular diastolic function could not be evaluated.  2. Right ventricular systolic function is normal. The right ventricular size is normal. Tricuspid regurgitation signal is inadequate for assessing PA pressure.  3. Left atrial size was severely dilated.  4. The mitral valve is normal in structure. Trivial mitral valve regurgitation. No evidence of mitral stenosis.  5. The aortic valve is tricuspid. Aortic valve regurgitation is not visualized. Mild aortic valve sclerosis is present, with no evidence of aortic valve stenosis.  6. The inferior vena cava is normal in size with greater than 50% respiratory variability, suggesting right atrial  pressure of 3 mmHg. FINDINGS  Left Ventricle: Left ventricular ejection fraction, by estimation, is 50 to 55%. The left ventricle has low normal function. The left ventricle has no regional wall motion abnormalities. The left ventricular internal cavity size was normal in size. There is mild left ventricular hypertrophy of the basal-septal segment. Left ventricular diastolic function could not be evaluated due to atrial  fibrillation. Left ventricular diastolic function could not be evaluated. Right Ventricle: The right ventricular size is normal.Right ventricular systolic function is normal. Tricuspid regurgitation signal is inadequate for assessing PA pressure. The tricuspid regurgitant velocity is 2.22 m/s, and with an assumed right atrial pressure of 3 mmHg, the estimated right ventricular systolic pressure is 94.1 mmHg. Left Atrium: Left atrial size was severely dilated. Right Atrium: Right atrial size was normal in size. Pericardium: There is no evidence of pericardial effusion. Mitral Valve: The mitral valve is normal in structure. Normal mobility of the mitral valve leaflets. Trivial mitral valve regurgitation. No evidence of mitral valve stenosis. Tricuspid Valve: The tricuspid valve is normal in structure. Tricuspid valve regurgitation is trivial. No evidence of tricuspid stenosis. Aortic Valve: The aortic valve is tricuspid. Aortic valve regurgitation is not visualized. Mild aortic valve sclerosis is present, with no evidence of aortic valve stenosis. Pulmonic Valve: The pulmonic valve was not well visualized. Pulmonic valve regurgitation is trivial. No evidence of pulmonic stenosis. Aorta: The aortic root is normal in size and structure. Venous: The inferior vena cava is normal in size with greater than 50% respiratory variability, suggesting right atrial pressure of 3 mmHg.  LEFT VENTRICLE PLAX 2D LVIDd:         5.00 cm LVIDs:         4.50 cm LV PW:         1.00 cm LV IVS:        1.30 cm LVOT diam:      2.00 cm LV SV:         51 LV SV Index:   23 LVOT Area:     3.14 cm  RIGHT VENTRICLE          IVC RV Basal diam:  2.40 cm  IVC diam: 1.90 cm TAPSE (M-mode): 1.1 cm LEFT ATRIUM              Index       RIGHT ATRIUM           Index LA diam:        5.10 cm  2.36 cm/m  RA Area:     10.30 cm LA Vol (A2C):   105.0 ml 48.51 ml/m RA Volume:   18.00 ml  8.32 ml/m LA Vol (A4C):   113.0 ml 52.21 ml/m LA Biplane Vol: 111.0 ml 51.28 ml/m  AORTIC VALVE LVOT Vmax:   89.65 cm/s LVOT Vmean:  65.300 cm/s LVOT VTI:    0.161 m  AORTA Ao Root diam: 3.30 cm Ao Asc diam:  3.60 cm MITRAL VALVE               TRICUSPID VALVE MV Area (PHT): 4.68 cm    TR Peak grad:   19.7 mmHg MV Decel Time: 162 msec    TR Vmax:        222.00 cm/s MV E velocity: 84.60 cm/s                            SHUNTS                            Systemic VTI:  0.16 m                            Systemic Diam: 2.00 cm Kirk Ruths MD Electronically signed by Kirk Ruths MD Signature  Date/Time: 03/20/2020/3:29:45 PM    Final    DG HIP UNILAT WITH PELVIS 2-3 VIEWS LEFT  Result Date: 03/19/2020 CLINICAL DATA:  74 year old male with concern for hip fracture. EXAM: DG HIP (WITH OR WITHOUT PELVIS) 2-3V LEFT COMPARISON:  Pelvic radiograph dated 01/28/2020. FINDINGS: Partially visualized left hip arthroplasty. The visualized arthroplasty component appears intact. Partially visualized fracture of the proximal femur with cerclage wire, age indeterminate. Evaluation is limited due to partial visualization. This is however new since the radiograph of 01/28/2020. A the soft tissues are grossly unremarkable. Vascular calcifications noted. IMPRESSION: Partially visualized fracture of the proximal femur, age indeterminate. Dedicated left hip radiograph may provide better evaluation. Electronically Signed   By: Anner Crete M.D.   On: 03/19/2020 19:07    Cardiac Studies   Echocardiogram 03/20/20: 1. Left ventricular ejection fraction, by estimation, is 50 to 55%. The    left ventricle has low normal function. The left ventricle has no regional  wall motion abnormalities. There is mild left ventricular hypertrophy of  the basal-septal segment. Left  ventricular diastolic function could not be evaluated.  2. Right ventricular systolic function is normal. The right ventricular  size is normal. Tricuspid regurgitation signal is inadequate for assessing  PA pressure.  3. Left atrial size was severely dilated.  4. The mitral valve is normal in structure. Trivial mitral valve  regurgitation. No evidence of mitral stenosis.  5. The aortic valve is tricuspid. Aortic valve regurgitation is not  visualized. Mild aortic valve sclerosis is present, with no evidence of  aortic valve stenosis.  6. The inferior vena cava is normal in size with greater than 50%  respiratory variability, suggesting right atrial pressure of 3 mmHg.    Patient Profile     74 y.o. male with a PMH of CAD s/p CABG in 2015, HTN, HLD, Myasthenia Gravis, GERD, BPH, and gout, who is being followed by cardiology for the evaluation of new onset atrial fibrillation   Assessment & Plan    1. New onset atrial fibrillation: noted intraoperatively during hip replacement surgery 03/19/20. Rates are persistently in the 100s-120s despite addition of metoprolol 25mg  BID and amiodarone gtt. TSH is wnl. Echo showed EF 50-55%, mild basal-septal LVH, severe LAE, and no significant valvular abnormalities. Cleared by ortho to start eliquis 5mg  BID for CHA2DS2-VASc Score of 3.  - Continue amiodarone gtt for rate/rhythm control - will give IV bolus this morning - Continue metoprolol tartrate 25mg  BID - hesitant to uptitrate given baseline sinus bradycardia - Will start eliquis 5mg  BID this morning for stroke ppx - If rate control proves difficult over the weekend, may need to consider TEE/DCCV on Monday   2. CAD s/p CABG: no anginal complaints. No ischemic changes on  - Can stop aspirin given need for  anticoagulation for management of #1 - Continue statin  3. HLD: LDL 62 06/2019; at goal of <70 - Continue pravastatin  4. HTN: BP on the soft side - amlodipine held.  - Continue metoprolol tartrate and HCTZ as tolerated  5. Myasthenia Gravis: on prednisone and pyridostigmine - Monitor for bradycardia with addition of metoprolol to pyridostigmine regimen  6. Elevated blood sugars: A1C 6.0. - Encourage dietary/lifestyle modifications to prevent progression to DM type 2  7. Hematochezia: suspect hemorrhoid related rectal bleeding. No melena or hematemesis complaints.  - Close monitoring of Hgb with initiation of anticoagulation - Encouraged close GI follow-up to determine source of bleeding.   For questions or updates, please contact [place]  Please consult [email or web address] for contact info under Cardiology/STEMI.      Signed, Abigail Butts, PA-C  03/21/2020, 8:26 AM   [phone]

## 2020-03-21 NOTE — TOC Progression Note (Signed)
Transition of Care [facility]) - Progression Note    Patient Details  Name: Brandon [name] MRN: [id number] Date of Birth: [birth year]  Transition of Care [facility]) CM/SW Contact  Salvadore Valvano, Juliann Pulse, RN Phone Number: 03/21/2020, 10:51 AM  Clinical Narrative:       1.     [place]     [phone]           Quality rating       Patient survey rating     Compare    2.     [place]     [phone]           Quality rating       Patient survey rating     Compare    3.     [place]     [phone]           Quality rating       Patient survey rating     Compare    4.     [place]     [phone]           Quality rating       Patient survey rating     Compare          5.     [place]     [phone]           Quality rating       Patient survey rating     Compare    6.     [place]     [phone]           Quality rating       Patient survey rating     Compare    7.     [place]     [phone]           Quality rating       Patient survey rating     Compare    8.     [place]     [phone]           Quality rating       Patient survey rating   11  Compare    9.     [facility]     [phone]           Quality rating       Patient survey rating     Compare    10.     [place]     [phone]           Quality rating       Patient survey rating     Compare    11.     [facility] [place]     [phone]           Quality rating       Patient survey rating      Compare    12.     [facility] [place]     [phone]           Quality rating       Patient survey rating     Compare   Data last updated: March 05, 2020  To explore and download home health agency data,visit the  data catalog on [employer]      Finding a home health agency in your area  Home health agencies provide care in your home, regardless of where their office is located. This list shows the home health agencies that serve your area, based on the location you entered in your search.     Next steps for choosing a home health agency     Expected Discharge Plan: Home/Self Care Barriers to Discharge: Continued Medical Work up  Expected Discharge Plan and Services Expected Discharge Plan: Home/Self Care   Discharge Planning Services: CM Consult Post Acute Care Choice: NA Living arrangements for the past 2 months: Single Family Home                                       Social Determinants of Health (SDOH) Interventions    Readmission Risk Interventions No flowsheet data found.

## 2020-03-21 NOTE — TOC Progression Note (Signed)
Transition of Care [facility]) - Progression Note    Patient Details  Name: [name] MRN: [id number] Date of Birth: September 24, 1945  Transition of Care [facility]) CM/SW Contact  Kiearra Oyervides, Juliann Pulse, RN Phone Number: 03/21/2020, 10:57 AM  Clinical Narrative: PT recc HHPT-provided patient w/HHC agency list-await choice-he will discuss w/spouse.      Expected Discharge Plan: [place] Barriers to Discharge: Continued Medical Work up  Expected Discharge Plan and Services Expected Discharge Plan: [place]   Discharge Planning Services: CM Consult Post Acute Care Choice: NA Living arrangements for the past 2 months: Single Family Home                                       Social Determinants of Health (SDOH) Interventions    Readmission Risk Interventions No flowsheet data found.

## 2020-03-21 NOTE — Progress Notes (Signed)
ANTICOAGULATION CONSULT NOTE - Initial Consult  Pharmacy Consult for apixaban Indication: atrial fibrillation  No Known Allergies  Patient Measurements: Height: 6\' 1"  (185.4 cm) Weight: 92 kg (202 lb 12.8 oz) IBW/kg (Calculated) : 79.9  Vital Signs: Temp: 98.6 F (37 C) (08/06 0611) Temp Source: Oral (08/06 0611) BP: 110/84 (08/06 0611) Pulse Rate: 92 (08/06 0611)  Labs: Recent Labs    03/19/20 1218 03/19/20 1218 03/20/20 0537 03/21/20 0505  HGB 13.4   < > 11.6* 10.4*  HCT 41.7  --  36.5* 32.3*  PLT 206  --  167 189  CREATININE 1.17  --   --   --    < > = values in this interval not displayed.    Estimated Creatinine Clearance: 62.6 mL/min (by C-G formula based on SCr of 1.17 mg/dL).   Medical History: Past Medical History:  Diagnosis Date  . Arthritis   . Atherosclerotic heart disease of native coronary artery without angina pectoris   . Cataract   . Coronary atherosclerosis of native coronary artery    Multivessel status post CABG March 2015  . DDD (degenerative disc disease), lumbar    Dr. Carloyn Manner  . Essential hypertension, benign   . GERD (gastroesophageal reflux disease)   . Hard of hearing   . Hemorrhoids   . History of gout   . History of kidney stones   . History of [facility] spotted fever   . Hyperlipidemia, unspecified   . Hypertensive heart disease without heart failure   . Kidney stones   . Lumbago   . Lumbago   . Melena   . Mixed hyperlipidemia   . 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 Personal history of other diseases of the musculoskeletal system and connective tissue   . Plantar fascial fibromatosis   . Pleurodynia   . Renal calculus or stone 05/22/14  . Stress fracture 2021   left hip  . Unspecified abdominal pain   . Unspecified osteoarthritis, unspecified site     Medications: No anticoagulants PTA  Assessment: Pt is a 10 yoM admitted on 8/4 with left hip pain. Pt underwent ORIF on 8/4 and developed atrial fibrillation  intraoperatively. Pt initially prescribed xarelto 10 mg PO daily for DVT ppx post-op. Cardiology consulted, recommend initiation of apixaban.   Today, 03/21/20  CBC: Hgb (10.4) slightly low post-op. Plt WNL  Age (7), Wt (92 kg), SCr (1.17)  Confirmed with cardiology that transition to full dose apixaban today cleared with ortho.   Plan:   Discontinue rivaroxaban (last dose 8/5)  Start apixaban 5 mg PO BID  Education and manufacturer coupon completed  Monitor CBC  Lenis Noon, PharmD 03/21/2020,8:36 AM

## 2020-03-21 NOTE — Progress Notes (Signed)
Physical Therapy Treatment Patient Details Name: Goerge Mohr MRN: [id number] DOB: 09-Feb-1946 Today's Date: 03/21/2020    History of Present Illness Cipriano Millikan, 74 y.o. male, is status post left total hip arthroplasty, direct anterior approach for  a femoral neck fracture on 01/28/20 . Marland Kitchen Had an increase in pain four weeks out from surgery. MRI was obtained of the hip which showed a hematoma and questionable stress fracture. Hematoma was aspirated in the office and patient was ordered TDWB due to questionable fracture. Patient followed up in two weeks, and noted that his pain with slight movement had worsened. XRs were obtained which demonstrated a periprosthetic fracture. S/P  Open reduction internal fixation left periprosthetic femur fracture with femoral component revision.  Onset of afib with RVR intraop.  WNI:OEVO, CABG, LTKA,occular myasthenia    PT Comments    Pt progressing well, gait  limited by incr HR this am. Will see again in pm. Reviewed posterior THP with pt and staff.   Follow Up Recommendations  Home health PT;Supervision/Assistance - 24 hour     Equipment Recommendations  None recommended by PT    Recommendations for Other Services       Precautions / Restrictions Precautions Precautions: Fall;Posterior Hip Precaution Comments: onset afib/RVR intra op. monitor HR./sats; reviewed THP Restrictions Weight Bearing Restrictions: No LLE Weight Bearing: Weight bearing as tolerated    Mobility  Bed Mobility Overal bed mobility: Needs Assistance Bed Mobility: Supine to Sit     Supine to sit: Min assist;HOB elevated     General bed mobility comments: min assist for initiation left leg movement and to maintain THP  Transfers Overall transfer level: Needs assistance Equipment used: Rolling walker (2 wheeled) Transfers: Sit to/from Stand Sit to Stand: Min assist;From elevated surface         General transfer comment: cues for safety, hand and left leg  position  Ambulation/Gait Ambulation/Gait assistance: Min assist;Min guard Gait Distance (Feet): 35 Feet Assistive device: Rolling walker (2 wheeled) Gait Pattern/deviations: Step-to pattern;Step-through pattern;Decreased stance time - left;Decreased step length - left     General Gait Details: pt reports feeling well however gait distance limited by  incr HR, tachy, HR up to 144. chair to pt for seated rest    Stairs             Wheelchair Mobility    Modified Rankin (Stroke Patients Only)       Balance     Sitting balance-Leahy Scale: Good       Standing balance-Leahy Scale: Fair Standing balance comment: reliant on UE for dynamic tasks                            Cognition Arousal/Alertness: Awake/alert Behavior During Therapy: WFL for tasks assessed/performed Overall Cognitive Status: Within Functional Limits for tasks assessed                                        Exercises General Exercises - Lower Extremity Ankle Circles/Pumps: AROM;Both;10 reps Heel Slides: AAROM;AROM;Left;10 reps Hip ABduction/ADduction: AROM;AAROM;Left;10 reps    General Comments        Pertinent Vitals/Pain Pain Assessment: Faces Faces Pain Scale: Hurts a little bit Pain Location: left hip, Pain Descriptors / Indicators: Sore Pain Intervention(s): Limited activity within patient's tolerance;Monitored during session;Premedicated before session;Repositioned;Ice applied    Home Living  Prior Function            PT Goals (current goals can now be found in the care plan section) Acute Rehab PT Goals Patient Stated Goal: to go home, go fishing PT Goal Formulation: With patient/family Time For Goal Achievement: 04/03/20 Potential to Achieve Goals: Good Progress towards PT goals: Progressing toward goals    Frequency    Min 6X/week      PT Plan Current plan remains appropriate    Co-evaluation               AM-PAC PT "6 Clicks" Mobility   Outcome Measure  Help needed turning from your back to your side while in a flat bed without using bedrails?: A Little Help needed moving from lying on your back to sitting on the side of a flat bed without using bedrails?: A Little Help needed moving to and from a bed to a chair (including a wheelchair)?: A Little Help needed standing up from a chair using your arms (e.g., wheelchair or bedside chair)?: A Little Help needed to walk in hospital room?: A Little Help needed climbing 3-5 steps with a railing? : A Lot 6 Click Score: 17    End of Session Equipment Utilized During Treatment: Gait belt Activity Tolerance: Patient tolerated treatment well Patient left: in chair;with call bell/phone within reach;with chair alarm set Nurse Communication: Mobility status PT Visit Diagnosis: Unsteadiness on feet (R26.81);Pain Pain - Right/Left: Left Pain - part of body: Hip     Time: 1025-1040 PT Time Calculation (min) (ACUTE ONLY): 15 min  Charges:  $Gait Training: 8-22 mins                     Baxter Flattery, PT  Acute Rehab Dept ([place]) [phone] Pager [phone]  03/21/2020    [facility] 03/21/2020, 10:58 AM

## 2020-03-21 NOTE — Progress Notes (Signed)
03/21/20 1200  PT Visit Information  Last PT Received On 03/21/20  Pt is progressing well. Elevated HR however incr distance/HR improved from am session. Will continue to follow in acute setting.  Assistance Needed +1  History of Present Illness Brandon [name], 74 y.o. male, is status post left total hip arthroplasty, direct anterior approach for  a femoral neck fracture on 01/28/20 . Marland Kitchen Had an increase in pain four weeks out from surgery. MRI was obtained of the hip which showed a hematoma and questionable stress fracture. Hematoma was aspirated in the office and patient was ordered TDWB due to questionable fracture. Patient followed up in two weeks, and noted that his pain with slight movement had worsened. XRs were obtained which demonstrated a periprosthetic fracture. S/P  Open reduction internal fixation left periprosthetic femur fracture with femoral component revision.  Onset of afib with RVR intraop.  ZOX:WRUE, CABG, LTKA,occular myasthenia  Subjective Data  Patient Stated Goal to go home, go fishing  Precautions  Precautions Fall;Posterior Hip  Precaution Comments onset afib/RVR intra op. monitor HR./sats; reviewed THP  Restrictions  LLE Weight Bearing WBAT  Pain Assessment  Pain Assessment Faces  Faces Pain Scale 4  Pain Location left hip,  Pain Descriptors / Indicators Sore  Cognition  Arousal/Alertness Awake/alert  Behavior During Therapy WFL for tasks assessed/performed  Overall Cognitive Status Within Functional Limits for tasks assessed  Bed Mobility  Overal bed mobility Needs Assistance  Bed Mobility Sit to Supine  Sit to supine Min assist  General bed mobility comments min assist for initiation left leg movement and to maintain THP  Transfers  Overall transfer level Needs assistance  Equipment used Rolling walker (2 wheeled)  Transfers Sit to/from Stand  Sit to Stand Min assist;From elevated surface  General transfer comment cues for safety, hand and left leg  position  Ambulation/Gait  Ambulation/Gait assistance Min assist;Min guard  Gait Distance (Feet) 65 Feet (10' more )  Assistive device Rolling walker (2 wheeled)  Gait Pattern/deviations Step-to pattern;Step-through pattern;Decreased stance time - left;Decreased step length - left  General Gait Details cues for THP with turns ( to avoid INT rotation L hip).  2-3/4 DOE, HR max 138, tachy 110s to 123  with one spike to 138.    Balance  Sitting balance-Leahy Scale Good  Standing balance-Leahy Scale Fair  Standing balance comment reliant on UE for dynamic tasks  General Exercises - Lower Extremity  Ankle Circles/Pumps AROM;Both;10 reps  Heel Slides AAROM;AROM;Left;10 reps  Hip ABduction/ADduction AROM;AAROM;Left;10 reps  PT - End of Session  Equipment Utilized During Treatment Gait belt  Activity Tolerance Patient tolerated treatment well  Patient left in chair;with call bell/phone within reach;with chair alarm set  Nurse Communication Mobility status   PT - Assessment/Plan  PT Plan Current plan remains appropriate  PT Visit Diagnosis Unsteadiness on feet (R26.81);Pain  Pain - Right/Left Left  Pain - part of body Hip  PT Frequency (ACUTE ONLY) Min 6X/week  Follow Up Recommendations Home health PT;Supervision/Assistance - 24 hour  PT equipment None recommended by PT  AM-PAC PT "6 Clicks" Mobility Outcome Measure (Version 2)  Help needed turning from your back to your side while in a flat bed without using bedrails? 3  Help needed moving from lying on your back to sitting on the side of a flat bed without using bedrails? 3  Help needed moving to and from a bed to a chair (including a wheelchair)? 3  Help needed standing up from a  chair using your arms (e.g., wheelchair or bedside chair)? 3  Help needed to walk in hospital room? 3  Help needed climbing 3-5 steps with a railing?  2  6 Click Score 17  Consider Recommendation of Discharge To: Home with [facility]  PT Goal Progression  Progress  towards PT goals Progressing toward goals  Acute Rehab PT Goals  PT Goal Formulation With patient/family  Time For Goal Achievement 04/03/20  Potential to Achieve Goals Good

## 2020-03-21 NOTE — Evaluation (Signed)
Occupational Therapy Evaluation Patient Details Name: Brandon [name] MRN: [id number] DOB: Sep 07, 1945 Today's Date: 03/21/2020    History of Present Illness Magic Mohler, 74 y.o. male, is status post left total hip arthroplasty, direct anterior approach for  a femoral neck fracture on 01/28/20 . Marland Kitchen Had an increase in pain four weeks out from surgery. MRI was obtained of the hip which showed a hematoma and questionable stress fracture. Hematoma was aspirated in the office and patient was ordered TDWB due to questionable fracture. Patient followed up in two weeks, and noted that his pain with slight movement had worsened. XRs were obtained which demonstrated a periprosthetic fracture. S/P  Open reduction internal fixation left periprosthetic femur fracture with femoral component revision.  Onset of afib with RVR intraop.  WGN:FAOZ, CABG, LTKA,occular myasthenia   Clinical Impression   Pt admitted with the above. Pt currently with functional limitations due to the deficits listed below (see OT Problem List).  Pt will benefit from skilled OT to increase their safety and independence with ADL and functional mobility for ADL to facilitate discharge to venue listed below.      Follow Up Recommendations  Supervision - Intermittent    Equipment Recommendations  None recommended by OT    Recommendations for Other Services       Precautions / Restrictions Precautions Precautions: Fall;Posterior Hip Precaution Comments: onset afib/RVR intra op. monitor HR./sats; reviewed THP Restrictions LLE Weight Bearing: Weight bearing as tolerated      Mobility Bed Mobility Overal bed mobility: Needs Assistance Bed Mobility: Sit to Supine       Sit to supine: Min assist   General bed mobility comments: pt in chair  Transfers Overall transfer level: Needs assistance Equipment used: Rolling walker (2 wheeled) Transfers: Sit to/from [facility] Sit to Stand: Min assist;From  elevated surface Stand pivot transfers: Min assist       General transfer comment: cues for safety, hand and left leg position    Balance     Sitting balance-Leahy Scale: Good       Standing balance-Leahy Scale: Fair Standing balance comment: reliant on UE for dynamic tasks                           ADL either performed or assessed with clinical judgement   ADL Overall ADL's : Needs assistance/impaired Eating/Feeding: Independent;Sitting   Grooming: Set up;Sitting   Upper Body Bathing: Set up;Sitting   Lower Body Bathing: Moderate assistance;Sit to/from stand;Cueing for safety;Cueing for sequencing   Upper Body Dressing : Set up;Sitting   Lower Body Dressing: Moderate assistance;Sit to/from stand;Cueing for safety;Cueing for sequencing   Toilet Transfer: Minimal assistance;BSC;Cueing for safety;Cueing for sequencing;Adhering to hip precautions   Toileting- Clothing Manipulation and Hygiene: Minimal assistance;Sit to/from stand         General ADL Comments: Educated on AE and ADL's with hip precautions     Vision Patient Visual Report: No change from baseline       Perception     Praxis      Pertinent Vitals/Pain Pain Assessment: Faces Pain Score: 4  Faces Pain Scale: Hurts little more Pain Location: left hip, Pain Descriptors / Indicators: Sore Pain Intervention(s): Limited activity within patient's tolerance;Monitored during session;Repositioned     Hand Dominance     Extremity/Trunk Assessment Upper Extremity Assessment Upper Extremity Assessment: Overall WFL for tasks assessed           Communication Communication Communication:  HOH   Cognition Arousal/Alertness: Awake/alert Behavior During Therapy: WFL for tasks assessed/performed Overall Cognitive Status: Within Functional Limits for tasks assessed                                     General Comments       Exercises General Exercises - Lower  Extremity Ankle Circles/Pumps: AROM;Both;10 reps Heel Slides: AAROM;AROM;Left;10 reps Hip ABduction/ADduction: AROM;AAROM;Left;10 reps   Shoulder Instructions      Home Living Family/patient expects to be discharged to:: Private residence   Available Help at Discharge: Family Type of Home: House Home Access: Stairs to enter Technical brewer of Steps: 1   Home Layout: One level     Bathroom Shower/Tub: Occupational psychologist: Handicapped height     Home Equipment: Environmental consultant - 2 wheels;Crutches          Prior Functioning/Environment Level of Independence: Needs assistance  Gait / Transfers Assistance Needed: required use of RW              OT Problem List: Decreased strength;Decreased knowledge of use of DME or AE;Impaired balance (sitting and/or standing)      OT Treatment/Interventions: Self-care/ADL training;Patient/family education;DME and/or AE instruction    OT Goals(Current goals can be found in the care plan section) Acute Rehab OT Goals Patient Stated Goal: to go home, go fishing OT Goal Formulation: With patient Time For Goal Achievement: 03/28/20 ADL Goals Pt Will Perform Lower Body Dressing: with supervision;with adaptive equipment;with caregiver independent in assisting;sit to/from stand Pt Will Transfer to Toilet: with supervision;ambulating Pt Will Perform Toileting - Clothing Manipulation and hygiene: with supervision;sit to/from stand;with caregiver independent in assisting Additional ADL Goal #1: Pt will verbalize 3/3 THP in regards to ADL activity at I level  OT Frequency: Min 2X/week   Barriers to D/C:            Co-evaluation              AM-PAC OT "6 Clicks" Daily Activity     Outcome Measure Help from another person eating meals?: None Help from another person taking care of personal grooming?: A Little Help from another person toileting, which includes using toliet, bedpan, or urinal?: A Little Help from another  person bathing (including washing, rinsing, drying)?: A Little Help from another person to put on and taking off regular upper body clothing?: None Help from another person to put on and taking off regular lower body clothing?: A Little 6 Click Score: 20   End of Session Equipment Utilized During Treatment: Rolling walker Nurse Communication: Mobility status  Activity Tolerance: Patient tolerated treatment well Patient left: in chair  OT Visit Diagnosis: Unsteadiness on feet (R26.81);Muscle weakness (generalized) (M62.81)                Time: 5784-6962 OT Time Calculation (min): 26 min Charges:  OT General Charges $OT Visit: 1 Visit OT Evaluation $OT Eval Moderate Complexity: 1 Mod OT Treatments $Self Care/Home Management : 8-22 mins  Kari Baars, OT Acute Rehabilitation Services [phone] Office- [phone]     Taraji Mungo, Edwena Felty D 03/21/2020, 2:03 PM

## 2020-03-21 NOTE — Progress Notes (Signed)
Subjective: 2 Days Post-Op Procedure(s) (LRB): OPEN REDUCTION INTERNAL FIXATION (ORIF) PERIPROSTHETIC FRACTURE LEFT FEMUR WITH FEMORAL REVISION (Left) Patient reports pain as mild.   Patient seen in rounds for Dr. Wynelle Link. Patient is well, and has had no acute complaints or problems other than discomfort in the left hip. Sitting up in bed eating breakfast this morning. Denies CP, SHOB, N/V.  We will continue therapy today.   Objective: Vital signs in last 24 hours: Temp:  [97.5 F (36.4 C)-98.6 F (37 C)] 98.6 F (37 C) (08/06 0611) Pulse Rate:  [92-112] 92 (08/06 0611) Resp:  [18-22] 18 (08/06 0611) BP: (110-117)/(81-89) 110/84 (08/06 0611) SpO2:  [97 %-99 %] 99 % (08/06 0611)  Intake/Output from previous day:  Intake/Output Summary (Last 24 hours) at 03/21/2020 0904 Last data filed at 03/21/2020 0343 Gross per 24 hour  Intake 931.67 ml  Output 1450 ml  Net -518.33 ml     Intake/Output this shift: No intake/output data recorded.  Labs: Recent Labs    03/19/20 1218 03/20/20 0537 03/21/20 0505  HGB 13.4 11.6* 10.4*   Recent Labs    03/20/20 0537 03/21/20 0505  WBC 10.5 11.8*  RBC 3.74* 3.32*  HCT 36.5* 32.3*  PLT 167 189   Recent Labs    03/19/20 1218  NA 137  K 4.9  CL 102  CO2 22  BUN 22  CREATININE 1.17  GLUCOSE 137*  CALCIUM 8.9   No results for input(s): LABPT, INR in the last 72 hours.  Exam: General - Patient is Alert and Oriented Extremity - Neurologically intact Sensation intact distally Intact pulses distally Dorsiflexion/Plantar flexion intact Dressing - Drainage on the proximal bandage, mild oozing from this area, Aquacel changed today.  Motor Function - intact, moving foot and toes well on exam.   Past Medical History:  Diagnosis Date  . Arthritis   . Atherosclerotic heart disease of native coronary artery without angina pectoris   . Cataract   . Coronary atherosclerosis of native coronary artery    Multivessel status post CABG  March 2015  . DDD (degenerative disc disease), lumbar    Dr. Carloyn Manner  . Essential hypertension, benign   . GERD (gastroesophageal reflux disease)   . Hard of hearing   . Hemorrhoids   . History of gout   . History of kidney stones   . History of [facility] spotted fever   . Hyperlipidemia, unspecified   . Hypertensive heart disease without heart failure   . Kidney stones   . Lumbago   . Lumbago   . Melena   . Mixed hyperlipidemia   . Ocular myasthenia ([place])   . Personal history of other diseases of the musculoskeletal system and connective tissue   . Plantar fascial fibromatosis   . Pleurodynia   . Renal calculus or stone 05/22/14  . Stress fracture 2021   left hip  . Unspecified abdominal pain   . Unspecified osteoarthritis, unspecified site     Assessment/Plan: 2 Days Post-Op Procedure(s) (LRB): OPEN REDUCTION INTERNAL FIXATION (ORIF) PERIPROSTHETIC FRACTURE LEFT FEMUR WITH FEMORAL REVISION (Left) Active Problems:   Periprosthetic fracture around internal prosthetic left hip joint (HCC)   Atrial fibrillation with RVR (HCC)  Estimated body mass index is 26.76 kg/m as calculated from the following:   Height as of this encounter: 6\' 1"  (1.854 m).   Weight as of this encounter: 92 kg. Advance diet Up with therapy  DVT Prophylaxis - Eliquis 5 mg BID per Cardiology Weight bearing  as tolerated Hip precautions discussed with patient  Plan is to go Home after hospital stay. He is doing well in terms of his hip.   Discussed with Cardiology this morning, and we will plan to keep him today for continued rate monitoring and control. From an orthopaedic standpoint, he will be ready for discharge when stable per Cardiology. We greatly appreciate their assistance in caring for this patient.   Griffith Citron, PA-C Orthopedic Surgery [phone] 03/21/2020, 9:04 AM

## 2020-03-22 LAB — CBC
HCT: 29.7 % — ABNORMAL LOW (ref 39.0–52.0)
Hemoglobin: 9.3 g/dL — ABNORMAL LOW (ref 13.0–17.0)
MCH: 30.7 pg (ref 26.0–34.0)
MCHC: 31.3 g/dL (ref 30.0–36.0)
MCV: 98 fL (ref 80.0–100.0)
Platelets: 151 10*3/uL (ref 150–400)
RBC: 3.03 MIL/uL — ABNORMAL LOW (ref 4.22–5.81)
RDW: 14.6 % (ref 11.5–15.5)
WBC: 9.7 10*3/uL (ref 4.0–10.5)
nRBC: 0.6 % — ABNORMAL HIGH (ref 0.0–0.2)

## 2020-03-22 MED ORDER — METOPROLOL TARTRATE 25 MG PO TABS: 25.0000 mg | ORAL_TABLET | Freq: Two times a day (BID) | ORAL | 0 refills | Status: DC

## 2020-03-22 MED ORDER — AMIODARONE HCL 200 MG PO TABS: 200.0000 mg | ORAL_TABLET | Freq: Two times a day (BID) | ORAL | 0 refills | Status: DC

## 2020-03-22 MED ORDER — APIXABAN 5 MG PO TABS: 5.0000 mg | ORAL_TABLET | Freq: Two times a day (BID) | ORAL | 0 refills | Status: DC

## 2020-03-22 MED ORDER — HYDROCODONE-ACETAMINOPHEN 5-325 MG PO TABS: 1.0000 | ORAL_TABLET | ORAL | 0 refills | Status: DC | PRN

## 2020-03-22 MED ORDER — METHOCARBAMOL 500 MG PO TABS: 500.0000 mg | ORAL_TABLET | Freq: Three times a day (TID) | ORAL | 1 refills | Status: DC | PRN

## 2020-03-22 MED ORDER — AMIODARONE HCL 200 MG PO TABS
200.0000 mg | ORAL_TABLET | Freq: Two times a day (BID) | ORAL | Status: DC
  Administered 2020-03-22: 200 mg via ORAL
  Filled 2020-03-22 (×2): qty 1

## 2020-03-22 NOTE — Progress Notes (Signed)
Progress Note  Patient Name: Brandon [name] Date of Encounter: 03/22/2020  Primary Cardiologist: Rozann Lesches, MD   Subjective   Converted to NSR on IV Amio.  Denies any chest pain or SOB.   Inpatient Medications    Scheduled Meds: . allopurinol  150 mg Oral Daily  . apixaban  5 mg Oral BID  . Chlorhexidine Gluconate Cloth  6 each Topical Daily  . docusate sodium  100 mg Oral BID  . metoprolol tartrate  25 mg Oral BID  . pantoprazole  40 mg Oral Daily  . pravastatin  40 mg Oral QPM  . pyridostigmine  60 mg Oral TID  . tamsulosin  0.4 mg Oral BID   Continuous Infusions: . sodium chloride 75 mL/hr at 03/22/20 0500  . amiodarone 30 mg/hr (03/22/20 0610)  . methocarbamol (ROBAXIN) IV     PRN Meds: acetaminophen, bisacodyl, HYDROcodone-acetaminophen, HYDROcodone-acetaminophen, magnesium citrate, menthol-cetylpyridinium **OR** phenol, methocarbamol **OR** methocarbamol (ROBAXIN) IV, metoCLOPramide **OR** metoCLOPramide (REGLAN) injection, morphine injection, ondansetron **OR** ondansetron (ZOFRAN) IV, polyethylene glycol, traMADol   Vital Signs    Vitals:   03/21/20 2348 03/22/20 0300 03/22/20 0400 03/22/20 0500  BP: 102/72  102/71 106/67  Pulse: 77  62 62  Resp: 16 18 18 18   Temp: 97.8 F (36.6 C)   98.2 F (36.8 C)  TempSrc: Oral   Oral  SpO2:   97% 98%  Weight:      Height:        Intake/Output Summary (Last 24 hours) at 03/22/2020 0623 Last data filed at 03/22/2020 0500 Gross per 24 hour  Intake 2520.2 ml  Output 675 ml  Net 1845.2 ml   Filed Weights   03/19/20 1227  Weight: 92 kg    Telemetry    NSR- Personally Reviewed  ECG    No new tracings - Personally Reviewed  Physical Exam   GEN: Well nourished, well developed in no acute distress HEENT: Normal NECK: No JVD; No carotid bruits LYMPHATICS: No lymphadenopathy CARDIAC:RRR, no murmurs, rubs, gallops RESPIRATORY:  Clear to auscultation without rales, wheezing or rhonchi  ABDOMEN: Soft,  non-tender, non-distended MUSCULOSKELETAL:  No edema; No deformity  SKIN: Warm and dry NEUROLOGIC:  Alert and oriented x 3 PSYCHIATRIC:  Normal affect   Labs    Chemistry Recent Labs  Lab 03/19/20 1218  NA 137  K 4.9  CL 102  CO2 22  GLUCOSE 137*  BUN 22  CREATININE 1.17  CALCIUM 8.9  PROT 6.4*  ALBUMIN 3.9  AST 45*  ALT 34  ALKPHOS 80  BILITOT 0.8  GFRNONAA >60  GFRAA >60  ANIONGAP 13     Hematology Recent Labs  Lab 03/20/20 0537 03/21/20 0505 03/22/20 0516  WBC 10.5 11.8* 9.7  RBC 3.74* 3.32* 3.03*  HGB 11.6* 10.4* 9.3*  HCT 36.5* 32.3* 29.7*  MCV 97.6 97.3 98.0  MCH 31.0 31.3 30.7  MCHC 31.8 32.2 31.3  RDW 14.2 14.3 14.6  PLT 167 189 151    Cardiac EnzymesNo results for input(s): TROPONINI in the last 168 hours. No results for input(s): TROPIPOC in the last 168 hours.   BNPNo results for input(s): BNP, PROBNP in the last 168 hours.   DDimer No results for input(s): DDIMER in the last 168 hours.   Radiology    ECHOCARDIOGRAM COMPLETE  Result Date: 03/20/2020    ECHOCARDIOGRAM REPORT   Patient Name:   Brandon [name] Date of Exam: 03/20/2020 Medical Rec #:  [id number]  Height:       73.0 in Accession #:    [id number]         Weight:       202.8 lb Date of Birth:  1946-04-08          BSA:          2.164 m Patient Age:    30 years           BP:           117/89 mmHg Patient Gender: M                  HR:           108 bpm. Exam Location:  Inpatient Procedure: 2D Echo, Cardiac Doppler and Color Doppler Indications:    I48.0 Paroxysmal atrial fibrillation  History:        Patient has prior history of Echocardiogram examinations, most                 recent 09/19/2013. Risk Factors:Hypertension, Dyslipidemia and                 GERD.  Sonographer:    Tiffany Dance Referring Phys: [id number] [place]  1. Left ventricular ejection fraction, by estimation, is 50 to 55%. The left ventricle has low normal function. The left ventricle has no  regional wall motion abnormalities. There is mild left ventricular hypertrophy of the basal-septal segment. Left ventricular diastolic function could not be evaluated.  2. Right ventricular systolic function is normal. The right ventricular size is normal. Tricuspid regurgitation signal is inadequate for assessing PA pressure.  3. Left atrial size was severely dilated.  4. The mitral valve is normal in structure. Trivial mitral valve regurgitation. No evidence of mitral stenosis.  5. The aortic valve is tricuspid. Aortic valve regurgitation is not visualized. Mild aortic valve sclerosis is present, with no evidence of aortic valve stenosis.  6. The inferior vena cava is normal in size with greater than 50% respiratory variability, suggesting right atrial pressure of 3 mmHg. FINDINGS  Left Ventricle: Left ventricular ejection fraction, by estimation, is 50 to 55%. The left ventricle has low normal function. The left ventricle has no regional wall motion abnormalities. The left ventricular internal cavity size was normal in size. There is mild left ventricular hypertrophy of the basal-septal segment. Left ventricular diastolic function could not be evaluated due to atrial fibrillation. Left ventricular diastolic function could not be evaluated. Right Ventricle: The right ventricular size is normal.Right ventricular systolic function is normal. Tricuspid regurgitation signal is inadequate for assessing PA pressure. The tricuspid regurgitant velocity is 2.22 m/s, and with an assumed right atrial pressure of 3 mmHg, the estimated right ventricular systolic pressure is 94.8 mmHg. Left Atrium: Left atrial size was severely dilated. Right Atrium: Right atrial size was normal in size. Pericardium: There is no evidence of pericardial effusion. Mitral Valve: The mitral valve is normal in structure. Normal mobility of the mitral valve leaflets. Trivial mitral valve regurgitation. No evidence of mitral valve stenosis. Tricuspid  Valve: The tricuspid valve is normal in structure. Tricuspid valve regurgitation is trivial. No evidence of tricuspid stenosis. Aortic Valve: The aortic valve is tricuspid. Aortic valve regurgitation is not visualized. Mild aortic valve sclerosis is present, with no evidence of aortic valve stenosis. Pulmonic Valve: The pulmonic valve was not well visualized. Pulmonic valve regurgitation is trivial. No evidence of pulmonic stenosis. Aorta: The aortic root is normal in size and structure.  Venous: The inferior vena cava is normal in size with greater than 50% respiratory variability, suggesting right atrial pressure of 3 mmHg.  LEFT VENTRICLE PLAX 2D LVIDd:         5.00 cm LVIDs:         4.50 cm LV PW:         1.00 cm LV IVS:        1.30 cm LVOT diam:     2.00 cm LV SV:         51 LV SV Index:   23 LVOT Area:     3.14 cm  RIGHT VENTRICLE          IVC RV Basal diam:  2.40 cm  IVC diam: 1.90 cm TAPSE (M-mode): 1.1 cm LEFT ATRIUM              Index       RIGHT ATRIUM           Index LA diam:        5.10 cm  2.36 cm/m  RA Area:     10.30 cm LA Vol (A2C):   105.0 ml 48.51 ml/m RA Volume:   18.00 ml  8.32 ml/m LA Vol (A4C):   113.0 ml 52.21 ml/m LA Biplane Vol: 111.0 ml 51.28 ml/m  AORTIC VALVE LVOT Vmax:   89.65 cm/s LVOT Vmean:  65.300 cm/s LVOT VTI:    0.161 m  AORTA Ao Root diam: 3.30 cm Ao Asc diam:  3.60 cm MITRAL VALVE               TRICUSPID VALVE MV Area (PHT): 4.68 cm    TR Peak grad:   19.7 mmHg MV Decel Time: 162 msec    TR Vmax:        222.00 cm/s MV E velocity: 84.60 cm/s                            SHUNTS                            Systemic VTI:  0.16 m                            Systemic Diam: 2.00 cm Kirk Ruths MD Electronically signed by Kirk Ruths MD Signature Date/Time: 03/20/2020/3:29:45 PM    Final     Cardiac Studies   Echocardiogram 03/20/20: 1. Left ventricular ejection fraction, by estimation, is 50 to 55%. The  left ventricle has low normal function. The left ventricle has no  regional  wall motion abnormalities. There is mild left ventricular hypertrophy of  the basal-septal segment. Left  ventricular diastolic function could not be evaluated.  2. Right ventricular systolic function is normal. The right ventricular  size is normal. Tricuspid regurgitation signal is inadequate for assessing  PA pressure.  3. Left atrial size was severely dilated.  4. The mitral valve is normal in structure. Trivial mitral valve  regurgitation. No evidence of mitral stenosis.  5. The aortic valve is tricuspid. Aortic valve regurgitation is not  visualized. Mild aortic valve sclerosis is present, with no evidence of  aortic valve stenosis.  6. The inferior vena cava is normal in size with greater than 50%  respiratory variability, suggesting right atrial pressure of 3 mmHg.    Patient Profile     74 y.o. male  with a PMH of CAD s/p CABG in 2015, HTN, HLD, Myasthenia Gravis, GERD, BPH, and gout, who is being followed by cardiology for the evaluation of new onset atrial fibrillation   Assessment & Plan    1. New onset atrial fibrillation: noted intraoperatively during hip replacement surgery 03/19/20.  -converted to NSR on metoprolol 25mg  BID and amiodarone gtt.  -TSH is wnl.  -Echo showed EF 50-55%, mild basal-septal LVH, severe LAE, and no significant valvular abnormalities. -Cleared by ortho to start eliquis 5mg  BID for CHA2DS2-VASc Score of 3.  -since he has converted to NSR will change IV Amo to PO 200mg  BID - Continue metoprolol tartrate 25mg  BID - continue eliquis 5mg  BID - will have him seen back with Dr. Domenic Polite in 1 week>>may not need to be on Amio longterm  2. CAD s/p CABG: no anginal complaints. No ischemic changes on  - Can stop aspirin given need for anticoagulation for management of #1 - Continue statin  3. HLD: LDL 62 06/2019; at goal of <70 - Continue pravastatin  4. HTN:  -BP on the soft side -continue Metoprolol tartrate 25mg  BID -stopped HCTZ  and amlodipine  5. Myasthenia Gravis: on prednisone and pyridostigmine - Monitor for bradycardia with addition of metoprolol to pyridostigmine regimen  6. Elevated blood sugars: A1C 6.0. - Encourage dietary/lifestyle modifications to prevent progression to DM type 2  7. Hematochezia: suspect hemorrhoid related rectal bleeding. No melena or hematemesis complaints.  - Close monitoring of Hgb with initiation of anticoagulation - Encouraged close GI follow-up to determine source of bleeding.   [facility] will sign off.   Medication Recommendations:  Apixaban 5mg  BID, Metoprolol tartrate 25mg  BID, Amiodarone 200mg  BID Other recommendations (labs, testing, etc):  none Follow up as an outpatient:  Dr. Domenic Polite in 1-2 weeks  For questions or updates, please contact [facility] Please consult [email or web address] for contact info under Cardiology/STEMI.      Signed, Fransico Him, MD  03/22/2020, 8:32 AM   [phone]

## 2020-03-22 NOTE — Progress Notes (Signed)
Mrs. Gassett given discharge instructions and paperwork.  Patient and Mrs. Busta expressed understanding of discharge instructions via teach back method.  All PT supplies, ice packs, and aids were given to patient.  Dressing was changed prior to discharge.  Patient tolerated well.  Will proceed with discharging patient.

## 2020-03-22 NOTE — Progress Notes (Signed)
Orthopedics Progress Note  Subjective: Stable this AM. No complaints. He does want to go home. Some pain in the left hip  Objective:  Vitals:   03/22/20 0500 03/22/20 0912  BP: 106/67   Pulse: 62 73  Resp: 18   Temp: 98.2 F (36.8 C)   SpO2: 98%     General: Awake and alert  Musculoskeletal: Left hip with Aquacel dressing in place with some old blood proximally. Moderate swelling in the proximal thigh area. Compartments supple Neurovascularly intact  Lab Results  Component Value Date   WBC 9.7 03/22/2020   HGB 9.3 (L) 03/22/2020   HCT 29.7 (L) 03/22/2020   MCV 98.0 03/22/2020   PLT 151 03/22/2020       Component Value Date/Time   NA 137 03/19/2020 1218   NA 138 06/28/2019 0000   K 4.9 03/19/2020 1218   CL 102 03/19/2020 1218   CO2 22 03/19/2020 1218   GLUCOSE 137 (H) 03/19/2020 1218   BUN 22 03/19/2020 1218   BUN 20 06/28/2019 0000   CREATININE 1.17 03/19/2020 1218   CALCIUM 8.9 03/19/2020 1218   GFRNONAA >60 03/19/2020 1218   GFRAA >60 03/19/2020 1218    Lab Results  Component Value Date   INR 0.9 01/24/2020   INR 1.27 10/19/2013   INR 0.85 10/18/2013    Assessment/Plan: POD #2 s/p Procedure(s): OPEN REDUCTION INTERNAL FIXATION (ORIF) PERIPROSTHETIC FRACTURE LEFT FEMUR WITH FEMORAL REVISION Cleared for discharge home from Cardiology on Metoprolol, Amiodorone and Eliquis Follow up with [facility] Dressing change before discharge  Remo Lipps R. Veverly Fells, MD 03/22/2020 10:08 AM

## 2020-03-22 NOTE — Progress Notes (Signed)
Physical Therapy Treatment Patient Details Name: Brandon [name] MRN: [id number] DOB: [birth year] Today's Date: 03/22/2020    History of Present Illness Yacqub Baston, 74 y.o. male, is status post left total hip arthroplasty, direct anterior approach for  a femoral neck fracture on 01/28/20 . Marland Kitchen Had an increase in pain four weeks out from surgery. MRI was obtained of the hip which showed a hematoma and questionable stress fracture. Hematoma was aspirated in the office and patient was ordered TDWB due to questionable fracture. Patient followed up in two weeks, and noted that his pain with slight movement had worsened. XRs were obtained which demonstrated a periprosthetic fracture. S/P  Open reduction internal fixation left periprosthetic femur fracture with femoral component revision.  Onset of afib with RVR intraop.  SPQ:ZRAQ, CABG, LTKA,occular myasthenia    PT Comments    Reviewed posterior THP with pt and wife, amb limited by pain today. Reviewed HEP and activity progression at home.verbally reviewed up/down one step and pt recalls technique correctly.   Will benefit from HHPT   Follow Up Recommendations  Home health PT;Supervision/Assistance - 24 hour     Equipment Recommendations  None recommended by PT    Recommendations for Other Services       Precautions / Restrictions Precautions Precautions: Fall;Posterior Hip Precaution Comments: converted to NSR last night on IV amiodarone.  reviewed  posterior THP with pt and wife  Restrictions Weight Bearing Restrictions: No LLE Weight Bearing: Weight bearing as tolerated    Mobility  Bed Mobility Overal bed mobility: Needs Assistance Bed Mobility: Supine to Sit     Supine to sit: Min guard     General bed mobility comments: instructed in use of gait belt as leg lifter, min/guard for safety and to maintain precautions   Transfers Overall transfer level: Needs assistance Equipment used: Rolling walker (2  wheeled) Transfers: Sit to/from [facility] Sit to Stand: Min guard Stand pivot transfers: Min assist;Min guard       General transfer comment: cues for safety, LLE position/THP  Ambulation/Gait             General Gait Details: limited by pain    Stairs             Wheelchair Mobility    Modified Rankin (Stroke Patients Only)       Balance                                            Cognition Arousal/Alertness: Awake/alert Behavior During Therapy: WFL for tasks assessed/performed Overall Cognitive Status: Within Functional Limits for tasks assessed                                        Exercises General Exercises - Lower Extremity Ankle Circles/Pumps: AROM;Both;10 reps Quad Sets: AROM;Both;10 reps Heel Slides: AAROM;AROM;Left;10 reps Hip ABduction/ADduction: AROM;AAROM;Left;10 reps    General Comments        Pertinent Vitals/Pain Pain Assessment: Faces Faces Pain Scale: Hurts even more Pain Location: left hip, Pain Descriptors / Indicators: Sore Pain Intervention(s): Limited activity within patient's tolerance;Monitored during session;Premedicated before session;Repositioned    Home Living                      Prior Function  PT Goals (current goals can now be found in the care plan section) Acute Rehab PT Goals Patient Stated Goal: to go home, go fishing PT Goal Formulation: With patient/family Time For Goal Achievement: 04/03/20 Potential to Achieve Goals: Good Progress towards PT goals: Progressing toward goals    Frequency    Min 6X/week      PT Plan Current plan remains appropriate    Co-evaluation              AM-PAC PT "6 Clicks" Mobility   Outcome Measure  Help needed turning from your back to your side while in a flat bed without using bedrails?: A Little Help needed moving from lying on your back to sitting on the side of a flat bed without  using bedrails?: A Little Help needed moving to and from a bed to a chair (including a wheelchair)?: A Little Help needed standing up from a chair using your arms (e.g., wheelchair or bedside chair)?: A Little Help needed to walk in hospital room?: A Little Help needed climbing 3-5 steps with a railing? : A Lot 6 Click Score: 17    End of Session Equipment Utilized During Treatment: Gait belt Activity Tolerance: Patient tolerated treatment well Patient left: in chair;with call bell/phone within reach;with nursing/sitter in room;with family/visitor present Nurse Communication: Mobility status PT Visit Diagnosis: Unsteadiness on feet (R26.81);Pain Pain - Right/Left: Left Pain - part of body: Hip     Time: 6063-0160 PT Time Calculation (min) (ACUTE ONLY): 23 min  Charges:  $Therapeutic Exercise: 8-22 mins $Therapeutic Activity: 8-22 mins                     Baxter Flattery, PT  Acute Rehab Dept ([place]) [phone] Pager [phone]  03/22/2020    [facility] 03/22/2020, 11:50 AM

## 2020-03-24 DIAGNOSIS — M722 Plantar fascial fibromatosis: Secondary | ICD-10-CM | POA: Diagnosis not present

## 2020-03-24 DIAGNOSIS — M9702XD Periprosthetic fracture around internal prosthetic left hip joint, subsequent encounter: Secondary | ICD-10-CM | POA: Diagnosis not present

## 2020-03-24 DIAGNOSIS — I4891 Unspecified atrial fibrillation: Secondary | ICD-10-CM | POA: Diagnosis not present

## 2020-03-24 DIAGNOSIS — Z7901 Long term (current) use of anticoagulants: Secondary | ICD-10-CM | POA: Diagnosis not present

## 2020-03-24 DIAGNOSIS — E782 Mixed hyperlipidemia: Secondary | ICD-10-CM | POA: Diagnosis not present

## 2020-03-24 DIAGNOSIS — Z951 Presence of aortocoronary bypass graft: Secondary | ICD-10-CM | POA: Diagnosis not present

## 2020-03-24 DIAGNOSIS — Z9181 History of falling: Secondary | ICD-10-CM | POA: Diagnosis not present

## 2020-03-24 DIAGNOSIS — Z7982 Long term (current) use of aspirin: Secondary | ICD-10-CM | POA: Diagnosis not present

## 2020-03-24 DIAGNOSIS — K219 Gastro-esophageal reflux disease without esophagitis: Secondary | ICD-10-CM | POA: Diagnosis not present

## 2020-03-24 DIAGNOSIS — M84352D Stress fracture, left femur, subsequent encounter for fracture with routine healing: Secondary | ICD-10-CM | POA: Diagnosis not present

## 2020-03-24 DIAGNOSIS — Z96659 Presence of unspecified artificial knee joint: Secondary | ICD-10-CM | POA: Diagnosis not present

## 2020-03-24 DIAGNOSIS — K8020 Calculus of gallbladder without cholecystitis without obstruction: Secondary | ICD-10-CM | POA: Diagnosis not present

## 2020-03-24 DIAGNOSIS — S72002A Fracture of unspecified part of neck of left femur, initial encounter for closed fracture: Secondary | ICD-10-CM | POA: Diagnosis not present

## 2020-03-24 DIAGNOSIS — M1810 Unilateral primary osteoarthritis of first carpometacarpal joint, unspecified hand: Secondary | ICD-10-CM | POA: Diagnosis not present

## 2020-03-24 DIAGNOSIS — Z96642 Presence of left artificial hip joint: Secondary | ICD-10-CM | POA: Diagnosis not present

## 2020-03-24 DIAGNOSIS — I119 Hypertensive heart disease without heart failure: Secondary | ICD-10-CM | POA: Diagnosis not present

## 2020-03-24 DIAGNOSIS — Z86010 Personal history of colonic polyps: Secondary | ICD-10-CM | POA: Diagnosis not present

## 2020-03-24 DIAGNOSIS — I2510 Atherosclerotic heart disease of native coronary artery without angina pectoris: Secondary | ICD-10-CM | POA: Diagnosis not present

## 2020-03-24 DIAGNOSIS — G7000 Myasthenia gravis without (acute) exacerbation: Secondary | ICD-10-CM | POA: Diagnosis not present

## 2020-03-24 DIAGNOSIS — M19021 Primary osteoarthritis, right elbow: Secondary | ICD-10-CM | POA: Diagnosis not present

## 2020-03-24 DIAGNOSIS — Z7952 Long term (current) use of systemic steroids: Secondary | ICD-10-CM | POA: Diagnosis not present

## 2020-03-25 NOTE — Discharge Summary (Signed)
Physician Discharge Summary   Patient ID: Brandon [name] MRN: [id number] DOB/AGE: 10-19-1945 74 y.o.  Admit date: 03/19/2020 Discharge date: 03/22/2020  Primary Diagnosis: Left periprosthetic femur fracture  Admission Diagnoses:  Past Medical History:  Diagnosis Date  . Arthritis   . Atherosclerotic heart disease of native coronary artery without angina pectoris   . Cataract   . Coronary atherosclerosis of native coronary artery    Multivessel status post CABG March 2015  . DDD (degenerative disc disease), lumbar    Dr. Carloyn Manner  . Essential hypertension, benign   . GERD (gastroesophageal reflux disease)   . Hard of hearing   . Hemorrhoids   . History of gout   . History of kidney stones   . History of [facility] spotted fever   . Hyperlipidemia, unspecified   . Hypertensive heart disease without heart failure   . Kidney stones   . Lumbago   . Lumbago   . Melena   . Mixed hyperlipidemia   . 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 Personal history of other diseases of the musculoskeletal system and connective tissue   . Plantar fascial fibromatosis   . Pleurodynia   . Renal calculus or stone 05/22/14  . Stress fracture 2021   left hip  . Unspecified abdominal pain   . Unspecified osteoarthritis, unspecified site    Discharge Diagnoses:   Active Problems:   Periprosthetic fracture around internal prosthetic left hip joint (HCC)   Atrial fibrillation with RVR (HCC)  Estimated body mass index is 26.76 kg/m as calculated from the following:   Height as of this encounter: 6\' 1"  (1.854 m).   Weight as of this encounter: 92 kg.  Procedure:  Procedure(s) (LRB): OPEN REDUCTION INTERNAL FIXATION (ORIF) PERIPROSTHETIC FRACTURE LEFT FEMUR WITH FEMORAL REVISION (Left)   Consults: cardiology  HPI: The patient is a 74 year old male who had a total hip arthroplasty done over a month ago.  He did great initially for the first 2 weeks then started to develop increased pain.  He had a  hematoma, which was drained, but that did not  improve his pain.  MRI showed a stress reaction of the femur, so we placed him on protected weightbearing.  He had a major increase in pain and was seen in the office today and noted to have a periprosthetic femur fracture with subsidence of the femoral stem.  He was admitted to the hospital today and presents today for the above-mentioned procedure.  Laboratory Data: Admission on 03/19/2020, Discharged on 03/22/2020  Component Date Value Ref Range Status  . SARS Coronavirus 2 03/19/2020 NEGATIVE  NEGATIVE Final   Comment: (NOTE) SARS-CoV-2 target nucleic acids are NOT DETECTED.  The SARS-CoV-2 RNA is generally detectable in upper and lower respiratory specimens during the acute phase of infection. The lowest concentration of SARS-CoV-2 viral copies this assay can detect is 250 copies / mL. A negative result does not preclude SARS-CoV-2 infection and should not be used as the sole basis for treatment or other patient management decisions.  A negative result may occur with improper specimen collection / handling, submission of specimen other than nasopharyngeal swab, presence of viral mutation(s) within the areas targeted by this assay, and inadequate number of viral copies (<250 copies / mL). A negative result must be combined with clinical observations, patient history, and epidemiological information.  Fact Sheet for Patients:   StrictlyIdeas.no  Fact Sheet for Healthcare Providers: BankingDealers.co.za  This test is not yet approved or  cleared by the [place] and has been authorized for detection and/or diagnosis of SARS-CoV-2 by FDA under an Emergency Use Authorization (EUA).  This EUA will remain in effect (meaning this test can be used) for the duration of the COVID-19 declaration under Section 564(b)(1) of the Act, 21 U.S.C. section 360bbb-3(b)(1),  unless the authorization is terminated or revoked sooner.  Performed at [facility], [place] [address], [place], [place] [address]   . ABO/RH(D) 03/19/2020 O POS   Final  . Antibody Screen 03/19/2020 NEG   Final  . Sample Expiration 03/19/2020    Final                   Value:03/22/2020,2359 Performed at [facility], [place] [address] [place], [place] [address]   . WBC 03/19/2020 13.1* 4.0 - 10.5 K/uL Final  . RBC 03/19/2020 4.35  4.22 - 5.81 MIL/uL Final  . Hemoglobin 03/19/2020 13.4  13.0 - 17.0 g/dL Final  . HCT 03/19/2020 41.7  39 - 52 % Final  . MCV 03/19/2020 95.9  80.0 - 100.0 fL Final  . MCH 03/19/2020 30.8  26.0 - 34.0 pg Final  . MCHC 03/19/2020 32.1  30.0 - 36.0 g/dL Final  . RDW 03/19/2020 14.3  11.5 - 15.5 % Final  . Platelets 03/19/2020 206  150 - 400 K/uL Final  . nRBC 03/19/2020 0.2  0.0 - 0.2 % Final  . Neutrophils Relative % 03/19/2020 90  % Final  . Neutro Abs 03/19/2020 11.8* 1.7 - 7.7 K/uL Final  . Lymphocytes Relative 03/19/2020 5  % Final  . Lymphs Abs 03/19/2020 0.6* 0.7 - 4.0 K/uL Final  . Monocytes Relative 03/19/2020 3  % Final  . Monocytes Absolute 03/19/2020 0.4  0 - 1 K/uL Final  . Eosinophils Relative 03/19/2020 0  % Final  . Eosinophils Absolute 03/19/2020 0.0  0 - 0 K/uL Final  . Basophils Relative 03/19/2020 0  % Final  . Basophils Absolute 03/19/2020 0.0  0 - 0 K/uL Final  . Immature Granulocytes 03/19/2020 2  % Final  . Abs Immature Granulocytes 03/19/2020 0.20* 0.00 - 0.07 K/uL Final   Performed at [facility], [place] [address], [place], [place] [address]  . Sodium 03/19/2020 137  135 - 145 mmol/L Final  . Potassium 03/19/2020 4.9  3.5 - 5.1 mmol/L Final  . Chloride 03/19/2020 102  98 - 111 mmol/L Final  . CO2 03/19/2020 22  22 - 32 mmol/L Final  . Glucose, Bld 03/19/2020 137* 70 - 99 mg/dL Final   Glucose reference range applies only to samples taken after fasting for at least 8 hours.    . BUN 03/19/2020 22  8 - 23 mg/dL Final  . Creatinine, Ser 03/19/2020 1.17  0.61 - 1.24 mg/dL Final  . Calcium 03/19/2020 8.9  8.9 - 10.3 mg/dL Final  . Total Protein 03/19/2020 6.4* 6.5 - 8.1 g/dL Final  . Albumin 03/19/2020 3.9  3.5 - 5.0 g/dL Final  . AST 03/19/2020 45* 15 - 41 U/L Final  . ALT 03/19/2020 34  0 - 44 U/L Final  . Alkaline Phosphatase 03/19/2020 80  38 - 126 U/L Final  . Total Bilirubin 03/19/2020 0.8  0.3 - 1.2 mg/dL Final  . GFR calc non Af Amer 03/19/2020 >60  >60 mL/min Final  . GFR calc Af Amer 03/19/2020 >60  >60 mL/min Final  . Anion gap 03/19/2020 13  5 - 15 Final  Performed at [facility], [place] [address] [place], [place] [address]  . Weight 03/20/2020 3,244.78  oz Final  . Height 03/20/2020 73  in Final  . BP 03/20/2020 117/89  mmHg Final  . S' Lateral 03/20/2020 4.50  cm Final  . Area-P 1/2 03/20/2020 4.68  cm2 Final  . WBC 03/20/2020 10.5  4.0 - 10.5 K/uL Final  . RBC 03/20/2020 3.74* 4.22 - 5.81 MIL/uL Final  . Hemoglobin 03/20/2020 11.6* 13.0 - 17.0 g/dL Final  . HCT 03/20/2020 36.5* 39 - 52 % Final  . MCV 03/20/2020 97.6  80.0 - 100.0 fL Final  . MCH 03/20/2020 31.0  26.0 - 34.0 pg Final  . MCHC 03/20/2020 31.8  30.0 - 36.0 g/dL Final  . RDW 03/20/2020 14.2  11.5 - 15.5 % Final  . Platelets 03/20/2020 167  150 - 400 K/uL Final  . nRBC 03/20/2020 0.0  0.0 - 0.2 % Final   Performed at [facility], [place] [address], [place], [place] [address]  . Hgb A1c MFr Bld 03/20/2020 6.0* 4.8 - 5.6 % Final   Comment: (NOTE) Pre diabetes:          5.7%-6.4%  Diabetes:              >6.4%  Glycemic control for   <7.0% adults with diabetes   . Mean Plasma Glucose 03/20/2020 125.5  mg/dL Final   Performed at [place] [address], [place], [place] [address]  . WBC 03/21/2020 11.8* 4.0 - 10.5 K/uL Final  . RBC 03/21/2020 3.32* 4.22 - 5.81 MIL/uL Final  . Hemoglobin 03/21/2020 10.4* 13.0 - 17.0 g/dL Final  .  HCT 03/21/2020 32.3* 39 - 52 % Final  . MCV 03/21/2020 97.3  80.0 - 100.0 fL Final  . MCH 03/21/2020 31.3  26.0 - 34.0 pg Final  . MCHC 03/21/2020 32.2  30.0 - 36.0 g/dL Final  . RDW 03/21/2020 14.3  11.5 - 15.5 % Final  . Platelets 03/21/2020 189  150 - 400 K/uL Final  . nRBC 03/21/2020 0.0  0.0 - 0.2 % Final   Performed at [facility], [place] [address] [place], [place] [address]  . TSH 03/21/2020 0.981  0.350 - 4.500 uIU/mL Final   Comment: Performed by a 3rd Generation assay with a functional sensitivity of <=0.01 uIU/mL. Performed at [facility], [place] [address], [place], [place] [address]   . WBC 03/22/2020 9.7  4.0 - 10.5 K/uL Final  . RBC 03/22/2020 3.03* 4.22 - 5.81 MIL/uL Final  . Hemoglobin 03/22/2020 9.3* 13.0 - 17.0 g/dL Final  . HCT 03/22/2020 29.7* 39 - 52 % Final  . MCV 03/22/2020 98.0  80.0 - 100.0 fL Final  . MCH 03/22/2020 30.7  26.0 - 34.0 pg Final  . MCHC 03/22/2020 31.3  30.0 - 36.0 g/dL Final  . RDW 03/22/2020 14.6  11.5 - 15.5 % Final  . Platelets 03/22/2020 151  150 - 400 K/uL Final  . nRBC 03/22/2020 0.6* 0.0 - 0.2 % Final   Performed at [facility], [place] [address], [place],  [address]  Admission on 01/28/2020, Discharged on 01/29/2020  Component Date Value Ref Range Status  . WBC 01/29/2020 12.1* 4.0 - 10.5 K/uL Final  . RBC 01/29/2020 4.24  4.22 - 5.81 MIL/uL Final  . Hemoglobin 01/29/2020 12.9* 13.0 - 17.0 g/dL Final  . HCT 01/29/2020 39.1  39 - 52 % Final  .  MCV 01/29/2020 92.2  80.0 - 100.0 fL Final  . MCH 01/29/2020 30.4  26.0 - 34.0 pg Final  . MCHC 01/29/2020 33.0  30.0 - 36.0 g/dL Final  . RDW 01/29/2020 14.3  11.5 - 15.5 % Final  . Platelets 01/29/2020 185  150 - 400 K/uL Final  . nRBC 01/29/2020 0.0  0.0 - 0.2 % Final   Performed at [facility], [place] [address] [place], [place] [address]  . Sodium 01/29/2020 137  135 - 145 mmol/L Final  . Potassium 01/29/2020  3.7  3.5 - 5.1 mmol/L Final  . Chloride 01/29/2020 100  98 - 111 mmol/L Final  . CO2 01/29/2020 25  22 - 32 mmol/L Final  . Glucose, Bld 01/29/2020 144* 70 - 99 mg/dL Final   Glucose reference range applies only to samples taken after fasting for at least 8 hours.  . BUN 01/29/2020 17  8 - 23 mg/dL Final  . Creatinine, Ser 01/29/2020 0.57* 0.61 - 1.24 mg/dL Final  . Calcium 01/29/2020 8.3* 8.9 - 10.3 mg/dL Final  . GFR calc non Af Amer 01/29/2020 >60  >60 mL/min Final  . GFR calc Af Amer 01/29/2020 >60  >60 mL/min Final  . Anion gap 01/29/2020 12  5 - 15 Final   Performed at [facility], [place] [address], [place], [place] [address]     X-Rays:DG Pelvis Portable  Result Date: 03/19/2020 CLINICAL DATA:  Post ORIF of LEFT periprosthetic femur fracture EXAM: PORTABLE PELVIS 1-2 VIEWS COMPARISON:  January 28, 2020 FINDINGS: Post revision of LEFT hip arthroplasty, femoral component. Revision of femoral component is associated with cerclage wires about a fracture that extends along the medial and posterior aspect of the proximal femur. Three cerclage wires are in place. Alignment is near anatomic. Distal aspect without signs of fracture with the new, longer femoral component in place. Tip of this femoral component is visualized on the femoral x-rays of March 19, 2020 which were acquired concurrently and reported separately. IMPRESSION: 1. Post revision of LEFT hip arthroplasty, femoral component. 2. Signs of periprosthetic fracture secured by cerclage wires and revision of femoral component with longer femoral component now in place. Expected postoperative changes with near anatomic alignment. Electronically Signed   By: Zetta Bills M.D.   On: 03/19/2020 19:21   ECHOCARDIOGRAM COMPLETE  Result Date: 03/20/2020    ECHOCARDIOGRAM REPORT   Patient Name:   ARIAN MURLEY Date of Exam: 03/20/2020 Medical Rec #:  [id number]          Height:       73.0 in Accession #:    [id number]          Weight:       202.8 lb Date of Birth:  1946-06-25          BSA:          2.164 m Patient Age:    64 years           BP:           117/89 mmHg Patient Gender: M                  HR:           108 bpm. Exam Location:  Inpatient Procedure: 2D Echo, Cardiac Doppler and Color Doppler Indications:    I48.0 Paroxysmal atrial fibrillation  History:        Patient has prior history of Echocardiogram examinations, most  recent 09/19/2013. Risk Factors:Hypertension, Dyslipidemia and                 GERD.  Sonographer:    Tiffany Dance Referring Phys: [id number] [place]  1. Left ventricular ejection fraction, by estimation, is 50 to 55%. The left ventricle has low normal function. The left ventricle has no regional wall motion abnormalities. There is mild left ventricular hypertrophy of the basal-septal segment. Left ventricular diastolic function could not be evaluated.  2. Right ventricular systolic function is normal. The right ventricular size is normal. Tricuspid regurgitation signal is inadequate for assessing PA pressure.  3. Left atrial size was severely dilated.  4. The mitral valve is normal in structure. Trivial mitral valve regurgitation. No evidence of mitral stenosis.  5. The aortic valve is tricuspid. Aortic valve regurgitation is not visualized. Mild aortic valve sclerosis is present, with no evidence of aortic valve stenosis.  6. The inferior vena cava is normal in size with greater than 50% respiratory variability, suggesting right atrial pressure of 3 mmHg. FINDINGS  Left Ventricle: Left ventricular ejection fraction, by estimation, is 50 to 55%. The left ventricle has low normal function. The left ventricle has no regional wall motion abnormalities. The left ventricular internal cavity size was normal in size. There is mild left ventricular hypertrophy of the basal-septal segment. Left ventricular diastolic function could not be evaluated due to atrial fibrillation. Left  ventricular diastolic function could not be evaluated. Right Ventricle: The right ventricular size is normal.Right ventricular systolic function is normal. Tricuspid regurgitation signal is inadequate for assessing PA pressure. The tricuspid regurgitant velocity is 2.22 m/s, and with an assumed right atrial pressure of 3 mmHg, the estimated right ventricular systolic pressure is 24.0 mmHg. Left Atrium: Left atrial size was severely dilated. Right Atrium: Right atrial size was normal in size. Pericardium: There is no evidence of pericardial effusion. Mitral Valve: The mitral valve is normal in structure. Normal mobility of the mitral valve leaflets. Trivial mitral valve regurgitation. No evidence of mitral valve stenosis. Tricuspid Valve: The tricuspid valve is normal in structure. Tricuspid valve regurgitation is trivial. No evidence of tricuspid stenosis. Aortic Valve: The aortic valve is tricuspid. Aortic valve regurgitation is not visualized. Mild aortic valve sclerosis is present, with no evidence of aortic valve stenosis. Pulmonic Valve: The pulmonic valve was not well visualized. Pulmonic valve regurgitation is trivial. No evidence of pulmonic stenosis. Aorta: The aortic root is normal in size and structure. Venous: The inferior vena cava is normal in size with greater than 50% respiratory variability, suggesting right atrial pressure of 3 mmHg.  LEFT VENTRICLE PLAX 2D LVIDd:         5.00 cm LVIDs:         4.50 cm LV PW:         1.00 cm LV IVS:        1.30 cm LVOT diam:     2.00 cm LV SV:         51 LV SV Index:   23 LVOT Area:     3.14 cm  RIGHT VENTRICLE          IVC RV Basal diam:  2.40 cm  IVC diam: 1.90 cm TAPSE (M-mode): 1.1 cm LEFT ATRIUM              Index       RIGHT ATRIUM           Index LA diam:  5.10 cm  2.36 cm/m  RA Area:     10.30 cm LA Vol (A2C):   105.0 ml 48.51 ml/m RA Volume:   18.00 ml  8.32 ml/m LA Vol (A4C):   113.0 ml 52.21 ml/m LA Biplane Vol: 111.0 ml 51.28 ml/m  AORTIC  VALVE LVOT Vmax:   89.65 cm/s LVOT Vmean:  65.300 cm/s LVOT VTI:    0.161 m  AORTA Ao Root diam: 3.30 cm Ao Asc diam:  3.60 cm MITRAL VALVE               TRICUSPID VALVE MV Area (PHT): 4.68 cm    TR Peak grad:   19.7 mmHg MV Decel Time: 162 msec    TR Vmax:        222.00 cm/s MV E velocity: 84.60 cm/s                            SHUNTS                            Systemic VTI:  0.16 m                            Systemic Diam: 2.00 cm Kirk Ruths MD Electronically signed by Kirk Ruths MD Signature Date/Time: 03/20/2020/3:29:45 PM    Final    DG HIP UNILAT WITH PELVIS 2-3 VIEWS LEFT  Result Date: 03/19/2020 CLINICAL DATA:  74 year old male with concern for hip fracture. EXAM: DG HIP (WITH OR WITHOUT PELVIS) 2-3V LEFT COMPARISON:  Pelvic radiograph dated 01/28/2020. FINDINGS: Partially visualized left hip arthroplasty. The visualized arthroplasty component appears intact. Partially visualized fracture of the proximal femur with cerclage wire, age indeterminate. Evaluation is limited due to partial visualization. This is however new since the radiograph of 01/28/2020. A the soft tissues are grossly unremarkable. Vascular calcifications noted. IMPRESSION: Partially visualized fracture of the proximal femur, age indeterminate. Dedicated left hip radiograph may provide better evaluation. Electronically Signed   By: Anner Crete M.D.   On: 03/19/2020 19:07    EKG: Orders placed or performed during the hospital encounter of 03/19/20  . EKG 12-Lead  . EKG 12-Lead     Hospital Course: Oskar Cretella is a 74 y.o. who was admitted to [facility]. They were brought to the operating room on 03/19/2020 and underwent Procedure(s): OPEN REDUCTION INTERNAL FIXATION (ORIF) PERIPROSTHETIC FRACTURE LEFT FEMUR WITH FEMORAL REVISION.  Patient tolerated the procedure well and was later transferred to the recovery room and then to the orthopaedic floor for postoperative care. They were given PO and IV  analgesics for pain control following their surgery. They were given 24 hours of postoperative antibiotics of  Anti-infectives (From admission, onward)   Start     Dose/Rate Route Frequency Ordered Stop   03/19/20 2200  ceFAZolin (ANCEF) IVPB 2g/100 mL premix        2 g 200 mL/hr over 30 Minutes Intravenous Every 6 hours 03/19/20 2054 03/20/20 0404   03/19/20 1200  ceFAZolin (ANCEF) IVPB 2g/100 mL premix        2 g 200 mL/hr over 30 Minutes Intravenous On call to O.R. 03/19/20 1155 03/19/20 1626     and started on DVT prophylaxis in the form of Xarelto.   PT and OT were ordered for total joint protocol. Discharge planning consulted to help with postop disposition and  equipment needs. Intra-operatively, patient was noted to have atrial fibrillation with RVR with no known previous history. Cardiology was consulted, and patient was started on Metoprolol and amiodarone. They started to get up OOB with therapy on POD #1. Echocardiogram performed. Patient was seen in rounds on POD #2, and was doing well in terms of his hip. He remained in atrial fibrillation. Continued to work with therapy into POD #2.  Recommendations made to switch patient to Eliquis 5 mg BID, which was done. Pt was seen during rounds on day three and was ready to go home pending progress with therapy. Converted to normal sinus rhythm on IV amiodarone. Dressing was changed. Pt worked with therapy for one additional session and was meeting their goals. He was discharged to home later that day in stable condition.  Diet: Regular diet Activity: WBAT Follow-up: in 2 weeks Disposition: Home Discharged Condition: good   Discharge Instructions    Call MD / Call 911   Complete by: As directed    If you experience chest pain or shortness of breath, CALL 911 and be transported to the hospital emergency room.  If you develope a fever above 101 F, pus (white drainage) or increased drainage or redness at the wound, or calf pain, call your  surgeon's office.   Constipation Prevention   Complete by: As directed    Drink plenty of fluids.  Prune juice may be helpful.  You may use a stool softener, such as Colace (over the counter) 100 mg twice a day.  Use MiraLax (over the counter) for constipation as needed.   Diet - low sodium heart healthy   Complete by: As directed    Increase activity slowly as tolerated   Complete by: As directed      Allergies as of 03/22/2020   No Known Allergies     Medication List    TAKE these medications   acetaminophen 500 MG tablet Commonly known as: TYLENOL Take 1,000 mg by mouth daily.   allopurinol 300 MG tablet Commonly known as: ZYLOPRIM Take 300 mg by mouth daily.   amiodarone 200 MG tablet Commonly known as: PACERONE Take 1 tablet (200 mg total) by mouth 2 (two) times daily.   amLODipine 5 MG tablet Commonly known as: NORVASC Take 1 tablet (5 mg total) by mouth daily.   apixaban 5 MG Tabs tablet Commonly known as: ELIQUIS Take 1 tablet (5 mg total) by mouth 2 (two) times daily.   aspirin EC 81 MG tablet Take 81 mg by mouth daily. Swallow whole.   Calcium 600-200 MG-UNIT tablet Take 1 tablet by mouth daily.   Fish Oil 500 MG Caps Take by mouth daily.   GAS FREE EXTRA STRENGTH PO Take by mouth. After meals   hydrochlorothiazide 50 MG tablet Commonly known as: HYDRODIURIL Take 50 mg by mouth daily.   HYDROcodone-acetaminophen 5-325 MG tablet Commonly known as: NORCO/VICODIN Take 1-2 tablets by mouth every 6 (six) hours as needed for severe pain. What changed: Another medication with the same name was added. Make sure you understand how and when to take each.   HYDROcodone-acetaminophen 5-325 MG tablet Commonly known as: NORCO/VICODIN Take 1-2 tablets by mouth every 4 (four) hours as needed for moderate pain (pain score 4-6). What changed: You were already taking a medication with the same name, and this prescription was added. Make sure you understand how and  when to take each.   methocarbamol 500 MG tablet Commonly known as: ROBAXIN Take 1 tablet (  500 mg total) by mouth every 6 (six) hours as needed for muscle spasms. What changed: Another medication with the same name was added. Make sure you understand how and when to take each.   methocarbamol 500 MG tablet Commonly known as: ROBAXIN Take 1 tablet (500 mg total) by mouth every 8 (eight) hours as needed for muscle spasms. What changed: You were already taking a medication with the same name, and this prescription was added. Make sure you understand how and when to take each.   metoprolol tartrate 25 MG tablet Commonly known as: LOPRESSOR Take 1 tablet (25 mg total) by mouth 2 (two) times daily.   multivitamin tablet Take 1 tablet by mouth daily.   nitroGLYCERIN 0.4 MG SL tablet Commonly known as: NITROSTAT Place 1 tablet (0.4 mg total) under the tongue every 5 (five) minutes as needed.   pantoprazole 40 MG tablet Commonly known as: Protonix Take 1 tablet (40 mg total) by mouth daily.   potassium chloride 10 MEQ tablet Commonly known as: KLOR-CON Take 10 mEq by mouth daily.   pravastatin 40 MG tablet Commonly known as: PRAVACHOL Take 40 mg by mouth every evening.   predniSONE 10 MG tablet Commonly known as: DELTASONE Take as directed; prednisone tapering   pyridostigmine 60 MG tablet Commonly known as: MESTINON Take 1 tablet (60 mg total) by mouth 3 (three) times daily.   tamsulosin 0.4 MG Caps capsule Commonly known as: FLOMAX Take 0.4 mg by mouth 2 (two) times daily.   traMADol 50 MG tablet Commonly known as: ULTRAM Take 1-2 tablets (50-100 mg total) by mouth every 6 (six) hours as needed for moderate pain.   UNABLE TO FIND Med Name: Osteo bioflex       Follow-up Information    Gaynelle Arabian, MD. Schedule an appointment as soon as possible for a visit on 04/01/2020.   Specialty: Orthopedic Surgery Contact information: [address] [place]  [address] [place] [address] [phone]        Satira Sark, MD Follow up.   Specialty: Cardiology Why: Our office will call you to schedule a follow-up visit. If you do not hear from [place] by Tuesday 03/25/2020, please give our office a call to schedule this. Contact information: [place] [address] [place], [place] Follow up.   Why: agency will provide home health physical therapy. Contact information: [place] [address] [phone]               Signed: Griffith Citron, PA-C Orthopedic Surgery 03/25/2020, 11:37 AM

## 2020-03-26 DIAGNOSIS — M84352D Stress fracture, left femur, subsequent encounter for fracture with routine healing: Secondary | ICD-10-CM | POA: Diagnosis not present

## 2020-03-26 DIAGNOSIS — G7000 Myasthenia gravis without (acute) exacerbation: Secondary | ICD-10-CM | POA: Diagnosis not present

## 2020-03-26 DIAGNOSIS — I119 Hypertensive heart disease without heart failure: Secondary | ICD-10-CM | POA: Diagnosis not present

## 2020-03-26 DIAGNOSIS — I4891 Unspecified atrial fibrillation: Secondary | ICD-10-CM | POA: Diagnosis not present

## 2020-03-26 DIAGNOSIS — M9702XD Periprosthetic fracture around internal prosthetic left hip joint, subsequent encounter: Secondary | ICD-10-CM | POA: Diagnosis not present

## 2020-03-26 DIAGNOSIS — I2510 Atherosclerotic heart disease of native coronary artery without angina pectoris: Secondary | ICD-10-CM | POA: Diagnosis not present

## 2020-03-28 DIAGNOSIS — I4891 Unspecified atrial fibrillation: Secondary | ICD-10-CM | POA: Diagnosis not present

## 2020-03-28 DIAGNOSIS — G7000 Myasthenia gravis without (acute) exacerbation: Secondary | ICD-10-CM | POA: Diagnosis not present

## 2020-03-28 DIAGNOSIS — I2510 Atherosclerotic heart disease of native coronary artery without angina pectoris: Secondary | ICD-10-CM | POA: Diagnosis not present

## 2020-03-28 DIAGNOSIS — I119 Hypertensive heart disease without heart failure: Secondary | ICD-10-CM | POA: Diagnosis not present

## 2020-03-28 DIAGNOSIS — M9702XD Periprosthetic fracture around internal prosthetic left hip joint, subsequent encounter: Secondary | ICD-10-CM | POA: Diagnosis not present

## 2020-03-28 DIAGNOSIS — M84352D Stress fracture, left femur, subsequent encounter for fracture with routine healing: Secondary | ICD-10-CM | POA: Diagnosis not present

## 2020-03-31 ENCOUNTER — Other Ambulatory Visit: Payer: Self-pay | Admitting: *Deleted

## 2020-03-31 DIAGNOSIS — M84352D Stress fracture, left femur, subsequent encounter for fracture with routine healing: Secondary | ICD-10-CM | POA: Diagnosis not present

## 2020-03-31 DIAGNOSIS — G7000 Myasthenia gravis without (acute) exacerbation: Secondary | ICD-10-CM | POA: Diagnosis not present

## 2020-03-31 DIAGNOSIS — I119 Hypertensive heart disease without heart failure: Secondary | ICD-10-CM | POA: Diagnosis not present

## 2020-03-31 DIAGNOSIS — I2510 Atherosclerotic heart disease of native coronary artery without angina pectoris: Secondary | ICD-10-CM | POA: Diagnosis not present

## 2020-03-31 DIAGNOSIS — M9702XD Periprosthetic fracture around internal prosthetic left hip joint, subsequent encounter: Secondary | ICD-10-CM | POA: Diagnosis not present

## 2020-03-31 DIAGNOSIS — I4891 Unspecified atrial fibrillation: Secondary | ICD-10-CM | POA: Diagnosis not present

## 2020-03-31 NOTE — Patient Outreach (Signed)
[facility]) Care Management  03/31/2020  Rad Gramling [birth year] [id number]  Telephone outreach for RED FLAG ON EMMI DISCHARGE CALL - Pt has questions on who to call.  Spoke with Brandon [name] and he denies having any questions at this time.  He reports he is recovering well from his L periprosthetic femur fx. He has all his meds. He is getting PT. He has a follow up with Dr. Maureen Ralphs tomorrow and a cardiology appt on 8/24.  He and his wife deny any care management needs at this time but do want to receive our information for future reference.  Will send letter.  Brandon [name]. Brandon Neither, MSN, [facility] Gerontological Nurse Practitioner [facility] Care Management [phone]

## 2020-04-01 DIAGNOSIS — M9702XA Periprosthetic fracture around internal prosthetic left hip joint, initial encounter: Secondary | ICD-10-CM | POA: Diagnosis not present

## 2020-04-01 DIAGNOSIS — Z96642 Presence of left artificial hip joint: Secondary | ICD-10-CM | POA: Diagnosis not present

## 2020-04-02 NOTE — Progress Notes (Signed)
Cardiology Office Note  Date: 04/03/2020   ID: Saqib, Cazarez 09-14-1945, MRN [id number]  PCP:  Sharilyn Sites, MD  Cardiologist:  Rozann Lesches, MD Electrophysiologist:  None   Chief Complaint: follow up CAD  History of Present Illness: Daily Crate is a 74 y.o. male with a history of CAD (CABG 10/2013), HTN, HLD, Hypertensive heart disease, Myasthenia gravis,  Recent stress fracture periprosthetic. Left total hip replacement. New onset atrial fibrillation noted during surgery.   Recent left total hip arthroplasty March 20, 2020 with intraoperative new onset atrial fibrillation.  Cardiology was consulted.  Initially started on Lopressor 25 mg p.o. twice daily.  Converted to normal sinus rhythm on metoprolol 25 mg p.o. twice daily and amiodarone drip.  TSH within normal limits, echo showed EF 50 to 55%.  Mild basal-septal LVH, severe LAE and no significant valvular abnormalities.  Plan to start Eliquis by Ortho at 5 mg p.o. twice daily.  CHA2DS2-VASc score 3.  Amiodarone was changed to p.o. 200 mg twice daily.  Metoprolol 25 mg was continued as well as Eliquis 5 mg p.o. twice daily.  Dr. Radford Pax stated  patient may not need to be on amiodarone long-term.  Aspirin was stopped due to patient being started on Eliquis.  HCTZ and amiodarone were stopped due to soft blood pressures.  Patient was on prednisone and pyridostigmine for recent diagnosis of myasthenia gravis.  Dr. Radford Pax noted patient needed to be monitored for bradycardia with addition of metoprolol to pyridostigmine regimen.  Patient states he is doing well from a cardiac standpoint. He denies any more palpitations or arrhythmias. Denies any anginal or exertional symptoms, orthostatic symptoms, CVA or TIA-like symptoms, dyspeptic symptoms, blood in stool or urine. No lower extremity claudication, no DVT or PE-like symptoms. Continues to have some lower extremity edema and the left leg which is the operative leg from left  total hip arthroplasty by Dr. Wynelle Link. He is continuing on prednisone for his myasthenia gravis with a tapering dose. He continues on. Pyridostigmine.   Past Medical History:  Diagnosis Date  . Arthritis   . Atherosclerotic heart disease of native coronary artery without angina pectoris   . Cataract   . Coronary atherosclerosis of native coronary artery    Multivessel status post CABG March 2015  . DDD (degenerative disc disease), lumbar    Dr. Carloyn Manner  . Essential hypertension, benign   . GERD (gastroesophageal reflux disease)   . Hard of hearing   . Hemorrhoids   . History of gout   . History of kidney stones   . History of [facility] spotted fever   . Hyperlipidemia, unspecified   . Hypertensive heart disease without heart failure   . Kidney stones   . Lumbago   . Lumbago   . Melena   . Mixed hyperlipidemia   . 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 Personal history of other diseases of the musculoskeletal system and connective tissue   . Plantar fascial fibromatosis   . Pleurodynia   . Renal calculus or stone 05/22/14  . Stress fracture 2021   left hip  . Unspecified abdominal pain   . Unspecified osteoarthritis, unspecified site     Past Surgical History:  Procedure Laterality Date  . AMPUTATION Right 03/27/2014   Procedure: DEBRIDEMENT AND CLOSURE RIGHT INDEX FINGER;  Surgeon: [name], MD;  Location: [place];  Service: Orthopedics;  Laterality: Right;  . BACK SURGERY    . BICEPT TENODESIS  11/26/2011  Procedure: BICEPT TENODESIS;  Surgeon: Augustin Schooling, MD;  Location: [place];  Service: Orthopedics;  Laterality: Left;  . BIOPSY  08/03/2018   Procedure: BIOPSY;  Surgeon: Rogene Houston, MD;  [facility];  Service: Endoscopy;;  antral  . Cataract surgery Left   . CHOLECYSTECTOMY N/A 09/22/2018   Procedure: LAPAROSCOPIC CHOLECYSTECTOMY;  Surgeon: Virl Cagey, MD;  [facility]: [facility];  Service: General;  Laterality: N/A;  . COLONOSCOPY    . COLONOSCOPY  N/A 12/26/2014   Procedure: COLONOSCOPY;  Surgeon: Rogene Houston, MD;  [facility];  Service: Endoscopy;  Laterality: N/A;  730  . CORONARY ARTERY BYPASS GRAFT N/A 10/19/2013   Procedure: CORONARY ARTERY BYPASS GRAFTING (CABG);  Surgeon: Rexene Alberts, MD;  Location: [place];  Service: Open Heart Surgery;  Laterality: N/A;  CABG times four using left internal mammary artery and left leg saphenous vein, incision made on right leg but no vein removed  . CYSTOSCOPY WITH RETROGRADE PYELOGRAM, URETEROSCOPY AND STENT PLACEMENT Left 08/24/2016   Procedure: CYSTOSCOPY WITH LEFT RETROGRADE PYELOGRAM, LEFT URETEROSCOPY, BASKET EXTRACTION LEFT URETERAL STONE;  Surgeon: Irine Seal, MD;  [facility];  Service: Urology;  Laterality: Left;  . ESOPHAGOGASTRODUODENOSCOPY N/A 08/03/2018   Procedure: ESOPHAGOGASTRODUODENOSCOPY (EGD);  Surgeon: Rogene Houston, MD;  [facility];  Service: Endoscopy;  Laterality: N/A;  2:55  . EXTRACORPOREAL SHOCK WAVE LITHOTRIPSY Right 06/18/2019   Procedure: EXTRACORPOREAL SHOCK WAVE LITHOTRIPSY (ESWL);  Surgeon: Lucas Mallow, MD;  [facility];  Service: Urology;  Laterality: Right;  . INTRAOPERATIVE TRANSESOPHAGEAL ECHOCARDIOGRAM N/A 10/19/2013   Procedure: INTRAOPERATIVE TRANSESOPHAGEAL ECHOCARDIOGRAM;  Surgeon: Rexene Alberts, MD;  Location: [place];  Service: Open Heart Surgery;  Laterality: N/A;  . JOINT REPLACEMENT    . LEFT HEART CATHETERIZATION WITH CORONARY ANGIOGRAM N/A 10/09/2013   Procedure: LEFT HEART CATHETERIZATION WITH CORONARY ANGIOGRAM;  Surgeon: Burnell Blanks, MD;  Location[facility];  Service: Cardiovascular;  Laterality: N/A;  . Left shoulder rotator cuff repair    . LITHOTRIPSY    . ORIF PERIPROSTHETIC FRACTURE Left 03/19/2020   Procedure: OPEN REDUCTION INTERNAL FIXATION (ORIF) PERIPROSTHETIC FRACTURE LEFT FEMUR WITH FEMORAL REVISION;  Surgeon: Gaynelle Arabian, MD;  [facility];  Service: Orthopedics;  Laterality:  Left;  . TOTAL HIP ARTHROPLASTY Left 01/28/2020   Procedure: TOTAL HIP ARTHROPLASTY ANTERIOR APPROACH;  Surgeon: Gaynelle Arabian, MD;  [facility];  Service: Orthopedics;  Laterality: Left;  169min  . TOTAL KNEE ARTHROPLASTY Left   . traumaticvamputation of finger  2015  . VASECTOMY      Current Outpatient Medications  Medication Sig Dispense Refill  . acetaminophen (TYLENOL) 500 MG tablet Take 1,000 mg by mouth daily.    Marland Kitchen allopurinol (ZYLOPRIM) 300 MG tablet Take 150 mg by mouth daily.     Marland Kitchen amiodarone (PACERONE) 200 MG tablet Take 1 tablet (200 mg total) by mouth daily. 30 tablet 6  . amLODipine (NORVASC) 5 MG tablet Take 1 tablet (5 mg total) by mouth daily. 30 tablet 1  . apixaban (ELIQUIS) 5 MG TABS tablet Take 1 tablet (5 mg total) by mouth 2 (two) times daily. 60 tablet 0  . Calcium 600-200 MG-UNIT tablet Take 1 tablet by mouth daily.    Marland Kitchen HYDROcodone-acetaminophen (NORCO/VICODIN) 5-325 MG tablet Take 1-2 tablets by mouth every 6 (six) hours as needed for severe pain. 56 tablet 0  . methocarbamol (ROBAXIN) 500 MG tablet Take 1 tablet (500 mg  total) by mouth every 8 (eight) hours as needed for muscle spasms. 40 tablet 1  . metoprolol tartrate (LOPRESSOR) 25 MG tablet Take 0.5 tablets (12.5 mg total) by mouth 2 (two) times daily. 30 tablet 6  . Multiple Vitamin (MULTIVITAMIN) tablet Take 1 tablet by mouth daily.    . nitroGLYCERIN (NITROSTAT) 0.4 MG SL tablet Place 1 tablet (0.4 mg total) under the tongue every 5 (five) minutes as needed. 25 tablet 3  . Omega-3 Fatty Acids (FISH OIL) 500 MG CAPS Take by mouth daily.    . pantoprazole (PROTONIX) 40 MG tablet Take 1 tablet (40 mg total) by mouth daily. 30 tablet 1  . pravastatin (PRAVACHOL) 40 MG tablet Take 40 mg by mouth every evening.     . predniSONE (DELTASONE) 10 MG tablet Take as directed; prednisone tapering 150 tablet 12  . pyridostigmine (MESTINON) 60 MG tablet Take 1 tablet (60 mg total) by mouth 3 (three) times daily. 90  tablet 12  . Simethicone (GAS FREE EXTRA STRENGTH PO) Take by mouth. After meals    . tamsulosin (FLOMAX) 0.4 MG CAPS capsule Take 0.4 mg by mouth 2 (two) times daily.     . traMADol (ULTRAM) 50 MG tablet Take 1-2 tablets (50-100 mg total) by mouth every 6 (six) hours as needed for moderate pain. 40 tablet 0  . UNABLE TO FIND Med Name: Osteo bioflex     No current facility-administered medications for this visit.   Allergies:  Patient has no known allergies.   Social History: The patient  reports that he quit smoking about 36 years ago. His smoking use included cigarettes. He started smoking about 61 years ago. He has a 60.00 pack-year smoking history. He has quit using smokeless tobacco.  His smokeless tobacco use included chew. He reports current alcohol use. He reports that he does not use drugs.   Family History: The patient's family history includes Heart failure in his mother; Hypertension in his sister; Lung cancer in his father.   ROS:  Please see the history of present illness. Otherwise, complete review of systems is positive for none.  All other systems are reviewed and negative.   Physical Exam: VS:  BP 116/60   Pulse 70   Ht 6\' 1"  (1.854 m)   Wt 211 lb 12.8 oz (96.1 kg)   SpO2 98%   BMI 27.94 kg/m , BMI Body mass index is 27.94 kg/m.  Wt Readings from Last 3 Encounters:  04/03/20 211 lb 12.8 oz (96.1 kg)  03/19/20 202 lb 12.8 oz (92 kg)  03/18/20 202 lb 12.8 oz (92 kg)    General: Patient appears comfortable at rest. Neck: Supple, no elevated JVP or carotid bruits, no thyromegaly. Lungs: Clear to auscultation, nonlabored breathing at rest. Cardiac: Regular rate and rhythm, no S3 or significant systolic murmur, no pericardial rub. Extremities: Mild nonpitting edema left lower extremity which is the operative extremity for recent hip surgery, distal pulses 2+. Skin: Warm and dry. Musculoskeletal: No kyphosis. Neuropsychiatric: Alert and oriented x3, affect grossly  appropriate.  ECG:  An ECG dated 04/03/2020 was personally reviewed today and demonstrated:  Normal sinus rhythm rate of 71, old inferior infarct.  Recent Labwork: 11/06/2019: Magnesium 2.1 03/19/2020: ALT 34; AST 45; BUN 22; Creatinine, Ser 1.17; Potassium 4.9; Sodium 137 03/21/2020: TSH 0.981 03/22/2020: Hemoglobin 9.3; Platelets 151     Component Value Date/Time   CHOL 134 06/28/2019 0000   TRIG 157 06/28/2019 0000   HDL 49 06/28/2019 0000  CHOLHDL 2.4 12/06/2013 0757   VLDL 18 12/06/2013 0757   LDLCALC 62 06/28/2019 0000    Other Studies Reviewed Today:  Echocardiogram 03/20/2020  1. Left ventricular ejection fraction, by estimation, is 50 to 55%. The left ventricle has low normal function. The left ventricle has no regional wall motion abnormalities. There is mild left ventricular hypertrophy of the basal-septal segment. Left ventricular diastolic function could not be evaluated. 2. Right ventricular systolic function is normal. The right ventricular size is normal. Tricuspid regurgitation signal is inadequate for assessing PA pressure. 3. Left atrial size was severely dilated. 4. The mitral valve is normal in structure. Trivial mitral valve regurgitation. No evidence of mitral stenosis. 5. The aortic valve is tricuspid. Aortic valve regurgitation is not visualized. Mild aortic valve sclerosis is present, with no evidence of aortic valve stenosis. 6. The inferior vena cava is normal in size with greater than 50% respiratory variability, suggesting right atrial pressure of 3 mmHg.  Assessment and Plan:  1. New onset atrial fibrillation ([place])   2. CAD in native artery   3. Essential hypertension, benign   4. Mixed hyperlipidemia    1. New onset atrial fibrillation ([place])  New atrial fibrillation noted intraoperatively during hip replacement surgery 03/19/20. Converted to NSR on metoprolol 25mg  BID and amiodarone gtt. Discharged on Amiodarone 200 mg po bid and Eliquis 5 mg po bid.  EKG shows normal sinus rhythm today rate of 71. Decrease amiodarone drips to 200 mg once per day. Decrease metoprolol to 12.5 mg p.o. twice daily. Stop aspirin due to continuation of Eliquis.  2. CAD in native artery Denies any progressive anginal or exertional symptoms. Continue nitroglycerin sublingual as needed. Aspirin was discontinued due to systemic anticoagulation for atrial fibrillation with Eliquis noted above.  3. Essential hypertension, benign Blood pressure 116/60 today. Continue amlodipine 5 mg daily. Metoprolol was decreased to 12.5 mg p.o. twice daily above in #1.  4. Mixed hyperlipidemia Continue pravastatin 40 mg p.o. daily.Lipid panel 06/28/2019 showed TC 134, HDL 49, TG 157, LDL 62.  Medication Adjustments/Labs and Tests Ordered: Current medicines are reviewed at length with the patient today.  Concerns regarding medicines are outlined above.   Disposition: Follow-up with follow-up with Dr. Domenic Polite or APP in 3 months.  Signed, Levell July, NP 04/03/2020 10:06 AM    [facility] [place], [place], [place] [address] Phone: [phone]; Fax: [phone]

## 2020-04-03 ENCOUNTER — Encounter: Payer: Self-pay | Admitting: Family Medicine

## 2020-04-03 ENCOUNTER — Ambulatory Visit: Payer: Medicare Other | Admitting: Cardiology

## 2020-04-03 ENCOUNTER — Ambulatory Visit (INDEPENDENT_AMBULATORY_CARE_PROVIDER_SITE_OTHER): Payer: Medicare Other | Admitting: Family Medicine

## 2020-04-03 VITALS — BP 116/60 | HR 70 | Ht 73.0 in | Wt 211.8 lb

## 2020-04-03 DIAGNOSIS — I10 Essential (primary) hypertension: Secondary | ICD-10-CM

## 2020-04-03 DIAGNOSIS — I4891 Unspecified atrial fibrillation: Secondary | ICD-10-CM

## 2020-04-03 DIAGNOSIS — I2510 Atherosclerotic heart disease of native coronary artery without angina pectoris: Secondary | ICD-10-CM

## 2020-04-03 DIAGNOSIS — I119 Hypertensive heart disease without heart failure: Secondary | ICD-10-CM | POA: Diagnosis not present

## 2020-04-03 DIAGNOSIS — G7000 Myasthenia gravis without (acute) exacerbation: Secondary | ICD-10-CM | POA: Diagnosis not present

## 2020-04-03 DIAGNOSIS — E782 Mixed hyperlipidemia: Secondary | ICD-10-CM

## 2020-04-03 DIAGNOSIS — M9702XD Periprosthetic fracture around internal prosthetic left hip joint, subsequent encounter: Secondary | ICD-10-CM | POA: Diagnosis not present

## 2020-04-03 DIAGNOSIS — M84352D Stress fracture, left femur, subsequent encounter for fracture with routine healing: Secondary | ICD-10-CM | POA: Diagnosis not present

## 2020-04-03 MED ORDER — METOPROLOL TARTRATE 25 MG PO TABS: 12.5000 mg | ORAL_TABLET | Freq: Two times a day (BID) | ORAL | 6 refills | Status: DC

## 2020-04-03 MED ORDER — AMIODARONE HCL 200 MG PO TABS
200.0000 mg | ORAL_TABLET | Freq: Every day | ORAL | 6 refills | Status: DC
Start: 2020-04-03 — End: 2020-06-27

## 2020-04-03 NOTE — Patient Instructions (Addendum)
Medication Instructions:   Your physician has recommended you make the following change in your medication:   Stop aspirin  Decrease amiodarone to 200 mg by mouth daily  Decrease metoprolol tartrate to 12.5 mg by mouth twice daily  Continue other medications the same  Labwork:  NONE  Testing/Procedures:  NONE  Follow-Up:  Your physician recommends that you schedule a follow-up appointment in: as planned in November 2021 with Dr. Domenic Polite.  Any Other Special Instructions Will Be Listed Below (If Applicable).  If you need a refill on your cardiac medications before your next appointment, please call your pharmacy.

## 2020-04-07 DIAGNOSIS — M9702XD Periprosthetic fracture around internal prosthetic left hip joint, subsequent encounter: Secondary | ICD-10-CM | POA: Diagnosis not present

## 2020-04-07 DIAGNOSIS — I2510 Atherosclerotic heart disease of native coronary artery without angina pectoris: Secondary | ICD-10-CM | POA: Diagnosis not present

## 2020-04-07 DIAGNOSIS — M84352D Stress fracture, left femur, subsequent encounter for fracture with routine healing: Secondary | ICD-10-CM | POA: Diagnosis not present

## 2020-04-07 DIAGNOSIS — I119 Hypertensive heart disease without heart failure: Secondary | ICD-10-CM | POA: Diagnosis not present

## 2020-04-07 DIAGNOSIS — I4891 Unspecified atrial fibrillation: Secondary | ICD-10-CM | POA: Diagnosis not present

## 2020-04-07 DIAGNOSIS — G7000 Myasthenia gravis without (acute) exacerbation: Secondary | ICD-10-CM | POA: Diagnosis not present

## 2020-04-08 ENCOUNTER — Ambulatory Visit: Payer: Medicare Other | Admitting: Family Medicine

## 2020-04-10 DIAGNOSIS — M9702XD Periprosthetic fracture around internal prosthetic left hip joint, subsequent encounter: Secondary | ICD-10-CM | POA: Diagnosis not present

## 2020-04-10 DIAGNOSIS — M84352D Stress fracture, left femur, subsequent encounter for fracture with routine healing: Secondary | ICD-10-CM | POA: Diagnosis not present

## 2020-04-10 DIAGNOSIS — I4891 Unspecified atrial fibrillation: Secondary | ICD-10-CM | POA: Diagnosis not present

## 2020-04-10 DIAGNOSIS — I119 Hypertensive heart disease without heart failure: Secondary | ICD-10-CM | POA: Diagnosis not present

## 2020-04-10 DIAGNOSIS — G7000 Myasthenia gravis without (acute) exacerbation: Secondary | ICD-10-CM | POA: Diagnosis not present

## 2020-04-10 DIAGNOSIS — I2510 Atherosclerotic heart disease of native coronary artery without angina pectoris: Secondary | ICD-10-CM | POA: Diagnosis not present

## 2020-04-15 ENCOUNTER — Ambulatory Visit: Payer: Medicare Other | Admitting: Diagnostic Neuroimaging

## 2020-04-15 DIAGNOSIS — I119 Hypertensive heart disease without heart failure: Secondary | ICD-10-CM | POA: Diagnosis not present

## 2020-04-15 DIAGNOSIS — M84352D Stress fracture, left femur, subsequent encounter for fracture with routine healing: Secondary | ICD-10-CM | POA: Diagnosis not present

## 2020-04-15 DIAGNOSIS — I4891 Unspecified atrial fibrillation: Secondary | ICD-10-CM | POA: Diagnosis not present

## 2020-04-15 DIAGNOSIS — I2510 Atherosclerotic heart disease of native coronary artery without angina pectoris: Secondary | ICD-10-CM | POA: Diagnosis not present

## 2020-04-15 DIAGNOSIS — G7000 Myasthenia gravis without (acute) exacerbation: Secondary | ICD-10-CM | POA: Diagnosis not present

## 2020-04-15 DIAGNOSIS — M9702XD Periprosthetic fracture around internal prosthetic left hip joint, subsequent encounter: Secondary | ICD-10-CM | POA: Diagnosis not present

## 2020-04-17 DIAGNOSIS — G7000 Myasthenia gravis without (acute) exacerbation: Secondary | ICD-10-CM | POA: Diagnosis not present

## 2020-04-17 DIAGNOSIS — M9702XD Periprosthetic fracture around internal prosthetic left hip joint, subsequent encounter: Secondary | ICD-10-CM | POA: Diagnosis not present

## 2020-04-17 DIAGNOSIS — M84352D Stress fracture, left femur, subsequent encounter for fracture with routine healing: Secondary | ICD-10-CM | POA: Diagnosis not present

## 2020-04-17 DIAGNOSIS — I119 Hypertensive heart disease without heart failure: Secondary | ICD-10-CM | POA: Diagnosis not present

## 2020-04-17 DIAGNOSIS — I4891 Unspecified atrial fibrillation: Secondary | ICD-10-CM | POA: Diagnosis not present

## 2020-04-17 DIAGNOSIS — I2510 Atherosclerotic heart disease of native coronary artery without angina pectoris: Secondary | ICD-10-CM | POA: Diagnosis not present

## 2020-04-18 ENCOUNTER — Ambulatory Visit: Payer: Medicare Other | Admitting: Cardiology

## 2020-04-22 DIAGNOSIS — Z961 Presence of intraocular lens: Secondary | ICD-10-CM | POA: Diagnosis not present

## 2020-04-22 DIAGNOSIS — H02834 Dermatochalasis of left upper eyelid: Secondary | ICD-10-CM | POA: Diagnosis not present

## 2020-04-22 DIAGNOSIS — H02831 Dermatochalasis of right upper eyelid: Secondary | ICD-10-CM | POA: Diagnosis not present

## 2020-04-22 DIAGNOSIS — G7000 Myasthenia gravis without (acute) exacerbation: Secondary | ICD-10-CM | POA: Diagnosis not present

## 2020-04-23 DIAGNOSIS — I4891 Unspecified atrial fibrillation: Secondary | ICD-10-CM | POA: Diagnosis not present

## 2020-04-23 DIAGNOSIS — K8020 Calculus of gallbladder without cholecystitis without obstruction: Secondary | ICD-10-CM | POA: Diagnosis not present

## 2020-04-23 DIAGNOSIS — M84352D Stress fracture, left femur, subsequent encounter for fracture with routine healing: Secondary | ICD-10-CM | POA: Diagnosis not present

## 2020-04-23 DIAGNOSIS — Z96642 Presence of left artificial hip joint: Secondary | ICD-10-CM | POA: Diagnosis not present

## 2020-04-23 DIAGNOSIS — Z7952 Long term (current) use of systemic steroids: Secondary | ICD-10-CM | POA: Diagnosis not present

## 2020-04-23 DIAGNOSIS — E782 Mixed hyperlipidemia: Secondary | ICD-10-CM | POA: Diagnosis not present

## 2020-04-23 DIAGNOSIS — G7000 Myasthenia gravis without (acute) exacerbation: Secondary | ICD-10-CM | POA: Diagnosis not present

## 2020-04-23 DIAGNOSIS — I2510 Atherosclerotic heart disease of native coronary artery without angina pectoris: Secondary | ICD-10-CM | POA: Diagnosis not present

## 2020-04-23 DIAGNOSIS — M19021 Primary osteoarthritis, right elbow: Secondary | ICD-10-CM | POA: Diagnosis not present

## 2020-04-23 DIAGNOSIS — Z86010 Personal history of colonic polyps: Secondary | ICD-10-CM | POA: Diagnosis not present

## 2020-04-23 DIAGNOSIS — I119 Hypertensive heart disease without heart failure: Secondary | ICD-10-CM | POA: Diagnosis not present

## 2020-04-23 DIAGNOSIS — Z951 Presence of aortocoronary bypass graft: Secondary | ICD-10-CM | POA: Diagnosis not present

## 2020-04-23 DIAGNOSIS — M1810 Unilateral primary osteoarthritis of first carpometacarpal joint, unspecified hand: Secondary | ICD-10-CM | POA: Diagnosis not present

## 2020-04-23 DIAGNOSIS — Z7901 Long term (current) use of anticoagulants: Secondary | ICD-10-CM | POA: Diagnosis not present

## 2020-04-23 DIAGNOSIS — Z9181 History of falling: Secondary | ICD-10-CM | POA: Diagnosis not present

## 2020-04-23 DIAGNOSIS — Z7982 Long term (current) use of aspirin: Secondary | ICD-10-CM | POA: Diagnosis not present

## 2020-04-23 DIAGNOSIS — Z96659 Presence of unspecified artificial knee joint: Secondary | ICD-10-CM | POA: Diagnosis not present

## 2020-04-23 DIAGNOSIS — M722 Plantar fascial fibromatosis: Secondary | ICD-10-CM | POA: Diagnosis not present

## 2020-04-23 DIAGNOSIS — K219 Gastro-esophageal reflux disease without esophagitis: Secondary | ICD-10-CM | POA: Diagnosis not present

## 2020-04-23 DIAGNOSIS — M9702XD Periprosthetic fracture around internal prosthetic left hip joint, subsequent encounter: Secondary | ICD-10-CM | POA: Diagnosis not present

## 2020-04-25 DIAGNOSIS — S72042D Displaced fracture of base of neck of left femur, subsequent encounter for closed fracture with routine healing: Secondary | ICD-10-CM | POA: Diagnosis not present

## 2020-04-30 DIAGNOSIS — M84352D Stress fracture, left femur, subsequent encounter for fracture with routine healing: Secondary | ICD-10-CM | POA: Diagnosis not present

## 2020-04-30 DIAGNOSIS — G7000 Myasthenia gravis without (acute) exacerbation: Secondary | ICD-10-CM | POA: Diagnosis not present

## 2020-04-30 DIAGNOSIS — I2510 Atherosclerotic heart disease of native coronary artery without angina pectoris: Secondary | ICD-10-CM | POA: Diagnosis not present

## 2020-04-30 DIAGNOSIS — I119 Hypertensive heart disease without heart failure: Secondary | ICD-10-CM | POA: Diagnosis not present

## 2020-04-30 DIAGNOSIS — M9702XD Periprosthetic fracture around internal prosthetic left hip joint, subsequent encounter: Secondary | ICD-10-CM | POA: Diagnosis not present

## 2020-04-30 DIAGNOSIS — I4891 Unspecified atrial fibrillation: Secondary | ICD-10-CM | POA: Diagnosis not present

## 2020-06-10 ENCOUNTER — Telehealth: Payer: Self-pay | Admitting: Diagnostic Neuroimaging

## 2020-06-10 NOTE — Telephone Encounter (Signed)
Wife(on DPR) calling to give pt update per the request of Rogue Jury.  Please call.

## 2020-06-10 NOTE — Telephone Encounter (Addendum)
Called wife, [name] who stated patient is doing well with good vision, "eyes look good". Still walking with cane to avoid falls, is working some. She wants to know what dose of prednisone he needs to be on now. He began taking 30 mg Oct 1st. I reviewed Dr [facility] office note and advised her on 06/16/20 cut back to 20 mg. She stated understanding, stated she will call in about a month with update, verbalized understanding, appreciation.

## 2020-06-17 DIAGNOSIS — Z23 Encounter for immunization: Secondary | ICD-10-CM | POA: Diagnosis not present

## 2020-06-27 ENCOUNTER — Other Ambulatory Visit: Payer: Self-pay

## 2020-06-27 ENCOUNTER — Ambulatory Visit (INDEPENDENT_AMBULATORY_CARE_PROVIDER_SITE_OTHER): Payer: Medicare Other | Admitting: Cardiology

## 2020-06-27 ENCOUNTER — Encounter: Payer: Self-pay | Admitting: Cardiology

## 2020-06-27 VITALS — BP 142/78 | HR 61 | Ht 73.0 in | Wt 213.0 lb

## 2020-06-27 DIAGNOSIS — I25119 Atherosclerotic heart disease of native coronary artery with unspecified angina pectoris: Secondary | ICD-10-CM | POA: Diagnosis not present

## 2020-06-27 DIAGNOSIS — I9789 Other postprocedural complications and disorders of the circulatory system, not elsewhere classified: Secondary | ICD-10-CM

## 2020-06-27 DIAGNOSIS — I4891 Unspecified atrial fibrillation: Secondary | ICD-10-CM

## 2020-06-27 DIAGNOSIS — E782 Mixed hyperlipidemia: Secondary | ICD-10-CM

## 2020-06-27 DIAGNOSIS — I2510 Atherosclerotic heart disease of native coronary artery without angina pectoris: Secondary | ICD-10-CM

## 2020-06-27 MED ORDER — ASPIRIN EC 81 MG PO TBEC: 81.0000 mg | DELAYED_RELEASE_TABLET | Freq: Every day | ORAL | 3 refills | Status: DC

## 2020-06-27 NOTE — Progress Notes (Signed)
Cardiology Office Note  Date: 06/27/2020   ID: Rion, Catala August 12, 1946, MRN [id number]  PCP:  Sharilyn Sites, MD  Cardiologist:  Rozann Lesches, MD Electrophysiologist:  None   Chief Complaint  Patient presents with  . Cardiac follow-up    History of Present Illness: Brandon [name] is a 74 y.o. male last seen in August by Mr. Leonides Sake NP.  He is here today with his wife for a follow-up visit.  From a cardiac perspective, he reports no angina or sense of palpitations, no unusual shortness of breath.  At the last visit amiodarone was reduced to 200 mg daily and he was taken off aspirin, he was otherwise continued on metoprolol and Eliquis for stroke prophylaxis with CHA2DS2-VASc score of 3.  Atrial fibrillation was diagnosed perioperatively at the time of hip replacement back in August.  He has had no obvious recurrences.  He would like to stop anticoagulation at this time.  I reviewed his remaining medications which are outlined below.  Past Medical History:  Diagnosis Date  . Arthritis   . Cataract   . Coronary atherosclerosis of native coronary artery    Multivessel status post CABG March 2015  . DDD (degenerative disc disease), lumbar    Dr. Carloyn Manner  . Essential hypertension   . GERD (gastroesophageal reflux disease)   . Hard of hearing   . Hemorrhoids   . History of gout   . History of kidney stones   . History of [facility] spotted fever   . Hypertensive heart disease without heart failure   . Lumbago   . Melena   . Mixed hyperlipidemia   . Ocular myasthenia ([place])   . Plantar fascial fibromatosis   . Pleurodynia   . Postoperative atrial fibrillation [facility])    August 2021    Past Surgical History:  Procedure Laterality Date  . AMPUTATION Right 03/27/2014   Procedure: DEBRIDEMENT AND CLOSURE RIGHT INDEX FINGER;  Surgeon: [name], MD;  Location: [place];  Service: Orthopedics;  Laterality: Right;  . BACK SURGERY    . BICEPT TENODESIS  11/26/2011    Procedure: BICEPT TENODESIS;  Surgeon: Augustin Schooling, MD;  Location: [place];  Service: Orthopedics;  Laterality: Left;  . BIOPSY  08/03/2018   Procedure: BIOPSY;  Surgeon: Rogene Houston, MD;  [facility];  Service: Endoscopy;;  antral  . Cataract surgery Left   . CHOLECYSTECTOMY N/A 09/22/2018   Procedure: LAPAROSCOPIC CHOLECYSTECTOMY;  Surgeon: Virl Cagey, MD;  Location: [facility];  Service: General;  Laterality: N/A;  . COLONOSCOPY    . COLONOSCOPY N/A 12/26/2014   Procedure: COLONOSCOPY;  Surgeon: Rogene Houston, MD;  [facility];  Service: Endoscopy;  Laterality: N/A;  730  . CORONARY ARTERY BYPASS GRAFT N/A 10/19/2013   Procedure: CORONARY ARTERY BYPASS GRAFTING (CABG);  Surgeon: Rexene Alberts, MD;  Location: [place];  Service: Open Heart Surgery;  Laterality: N/A;  CABG times four using left internal mammary artery and left leg saphenous vein, incision made on right leg but no vein removed  . CYSTOSCOPY WITH RETROGRADE PYELOGRAM, URETEROSCOPY AND STENT PLACEMENT Left 08/24/2016   Procedure: CYSTOSCOPY WITH LEFT RETROGRADE PYELOGRAM, LEFT URETEROSCOPY, BASKET EXTRACTION LEFT URETERAL STONE;  Surgeon: Irine Seal, MD;  [facility];  Service: Urology;  Laterality: Left;  . ESOPHAGOGASTRODUODENOSCOPY N/A 08/03/2018   Procedure: ESOPHAGOGASTRODUODENOSCOPY (EGD);  Surgeon: Rogene Houston, MD;  [facility];  Service: Endoscopy;  Laterality:  N/A;  2:55  . EXTRACORPOREAL SHOCK WAVE LITHOTRIPSY Right 06/18/2019   Procedure: EXTRACORPOREAL SHOCK WAVE LITHOTRIPSY (ESWL);  Surgeon: Lucas Mallow, MD;  [facility];  Service: Urology;  Laterality: Right;  . INTRAOPERATIVE TRANSESOPHAGEAL ECHOCARDIOGRAM N/A 10/19/2013   Procedure: INTRAOPERATIVE TRANSESOPHAGEAL ECHOCARDIOGRAM;  Surgeon: Rexene Alberts, MD;  Location: [place];  Service: Open Heart Surgery;  Laterality: N/A;  . JOINT REPLACEMENT    . LEFT HEART CATHETERIZATION WITH CORONARY ANGIOGRAM N/A  10/09/2013   Procedure: LEFT HEART CATHETERIZATION WITH CORONARY ANGIOGRAM;  Surgeon: Burnell Blanks, MD;  Location: [facility] CATH LAB;  Service: Cardiovascular;  Laterality: N/A;  . Left shoulder rotator cuff repair    . LITHOTRIPSY    . ORIF PERIPROSTHETIC FRACTURE Left 03/19/2020   Procedure: OPEN REDUCTION INTERNAL FIXATION (ORIF) PERIPROSTHETIC FRACTURE LEFT FEMUR WITH FEMORAL REVISION;  Surgeon: Gaynelle Arabian, MD;  [facility];  Service: Orthopedics;  Laterality: Left;  . TOTAL HIP ARTHROPLASTY Left 01/28/2020   Procedure: TOTAL HIP ARTHROPLASTY ANTERIOR APPROACH;  Surgeon: Gaynelle Arabian, MD;  [facility];  Service: Orthopedics;  Laterality: Left;  119min  . TOTAL KNEE ARTHROPLASTY Left   . traumaticvamputation of finger  2015  . VASECTOMY      Current Outpatient Medications  Medication Sig Dispense Refill  . acetaminophen (TYLENOL) 500 MG tablet Take 1,000 mg by mouth daily.    Marland Kitchen allopurinol (ZYLOPRIM) 300 MG tablet Take 150 mg by mouth daily.     Marland Kitchen amLODipine (NORVASC) 5 MG tablet Take 1 tablet (5 mg total) by mouth daily. 30 tablet 1  . Calcium 600-200 MG-UNIT tablet Take 1 tablet by mouth daily.    Marland Kitchen HYDROcodone-acetaminophen (NORCO/VICODIN) 5-325 MG tablet Take 1-2 tablets by mouth every 6 (six) hours as needed for severe pain. 56 tablet 0  . methocarbamol (ROBAXIN) 500 MG tablet Take 1 tablet (500 mg total) by mouth every 8 (eight) hours as needed for muscle spasms. 40 tablet 1  . metoprolol tartrate (LOPRESSOR) 25 MG tablet Take 0.5 tablets (12.5 mg total) by mouth 2 (two) times daily. 30 tablet 6  . Multiple Vitamin (MULTIVITAMIN) tablet Take 1 tablet by mouth daily.    . nitroGLYCERIN (NITROSTAT) 0.4 MG SL tablet Place 1 tablet (0.4 mg total) under the tongue every 5 (five) minutes as needed. 25 tablet 3  . Omega-3 Fatty Acids (FISH OIL) 500 MG CAPS Take by mouth daily.    . pantoprazole (PROTONIX) 40 MG tablet Take 1 tablet (40 mg total) by mouth daily. 30 tablet 1    . pravastatin (PRAVACHOL) 40 MG tablet Take 40 mg by mouth every evening.     . predniSONE (DELTASONE) 10 MG tablet Take as directed; prednisone tapering (Patient taking differently: Take as directed; prednisone tapering 06/27/20 taking 20 mg qd) 150 tablet 12  . pyridostigmine (MESTINON) 60 MG tablet Take 1 tablet (60 mg total) by mouth 3 (three) times daily. 90 tablet 12  . Simethicone (GAS FREE EXTRA STRENGTH PO) Take by mouth. After meals    . tamsulosin (FLOMAX) 0.4 MG CAPS capsule Take 0.4 mg by mouth 2 (two) times daily.     . traMADol (ULTRAM) 50 MG tablet Take 1-2 tablets (50-100 mg total) by mouth every 6 (six) hours as needed for moderate pain. 40 tablet 0  . UNABLE TO FIND Med Name: Osteo bioflex    . aspirin EC 81 MG tablet Take 1 tablet (81 mg total) by mouth daily. Swallow whole. 90 tablet 3  No current facility-administered medications for this visit.   Allergies:  Patient has no known allergies.   ROS: Left hip discomfort and lower back pain.  Using a cane.  Physical Exam: VS:  BP (!) 142/78   Pulse 61   Ht 6\' 1"  (1.854 m)   Wt 213 lb (96.6 kg)   SpO2 97%   BMI 28.10 kg/m , BMI Body mass index is 28.1 kg/m.  Wt Readings from Last 3 Encounters:  06/27/20 213 lb (96.6 kg)  04/03/20 211 lb 12.8 oz (96.1 kg)  03/19/20 202 lb 12.8 oz (92 kg)    General: Patient appears comfortable at rest. HEENT: Conjunctiva and lids normal, wearing a mask. Neck: Supple, no elevated JVP or carotid bruits, no thyromegaly. Lungs: Clear to auscultation, nonlabored breathing at rest. Cardiac: Regular rate and rhythm, no S3 or significant systolic murmur. Extremities: No pitting edema.  ECG:  An ECG dated 04/03/2020 was personally reviewed today and demonstrated:  Sinus rhythm with old inferior infarct pattern.  Recent Labwork: 11/06/2019: Magnesium 2.1 03/19/2020: ALT 34; AST 45; BUN 22; Creatinine, Ser 1.17; Potassium 4.9; Sodium 137 03/21/2020: TSH 0.981 03/22/2020: Hemoglobin 9.3;  Platelets 151     Component Value Date/Time   CHOL 134 06/28/2019 0000   TRIG 157 06/28/2019 0000   HDL 49 06/28/2019 0000   CHOLHDL 2.4 12/06/2013 0757   VLDL 18 12/06/2013 0757   LDLCALC 62 06/28/2019 0000    Other Studies Reviewed Today:  Echocardiogram 03/20/2020: 1. Left ventricular ejection fraction, by estimation, is 50 to 55%. The  left ventricle has low normal function. The left ventricle has no regional  wall motion abnormalities. There is mild left ventricular hypertrophy of  the basal-septal segment. Left  ventricular diastolic function could not be evaluated.  2. Right ventricular systolic function is normal. The right ventricular  size is normal. Tricuspid regurgitation signal is inadequate for assessing  PA pressure.  3. Left atrial size was severely dilated.  4. The mitral valve is normal in structure. Trivial mitral valve  regurgitation. No evidence of mitral stenosis.  5. The aortic valve is tricuspid. Aortic valve regurgitation is not  visualized. Mild aortic valve sclerosis is present, with no evidence of  aortic valve stenosis.  6. The inferior vena cava is normal in size with greater than 50%  respiratory variability, suggesting right atrial pressure of 3 mmHg.   Assessment and Plan:  1.  Perioperative atrial fibrillation at the time of hip surgery back in August.  CHA2DS2-VASc score is 3.  He has had no recurrences since that time, heart rate is regular today.  He would like to try and come off of anticoagulation for now, I have explained that if he has recurring atrial fibrillation this will need to be revisited.  For now we will stop both Eliquis and amiodarone, resume aspirin 81 mg daily along with low-dose beta-blocker.  2.  CAD status post CABG, no active angina symptoms.  Echocardiogram from August revealed LVEF 50 to 55%.  Aspirin is being resumed, continue Norvasc, Lopressor, and Pravachol.  3.  Mixed hyperlipidemia on Pravachol, last LDL  62.  Medication Adjustments/Labs and Tests Ordered: Current medicines are reviewed at length with the patient today.  Concerns regarding medicines are outlined above.   Tests Ordered: No orders of the defined types were placed in this encounter.   Medication Changes: Meds ordered this encounter  Medications  . aspirin EC 81 MG tablet    Sig: Take 1 tablet (81 mg  total) by mouth daily. Swallow whole.    Dispense:  90 tablet    Refill:  3    Disposition:  Follow up 6 months in the [place] office.  Signed, Satira Sark, MD, [facility] 06/27/2020 2:31 PM    [place] [facility] [address] [place], [place] [address] Phone: [phone]; Fax: [phone]

## 2020-06-27 NOTE — Patient Instructions (Signed)
Medication Instructions:  STOP Amiodarone   STOP Eliquis   START enteric coated Aspirin 81 mg daily  *If you need a refill on your cardiac medications before your next appointment, please call your pharmacy*   Lab Work: None today If you have labs (blood work) drawn today and your tests are completely normal, you will receive your results only by: Marland Kitchen MyChart Message (if you have MyChart) OR . A paper copy in the mail If you have any lab test that is abnormal or we need to change your treatment, we will call you to review the results.   Testing/Procedures: None today   Follow-Up: At [facility], you and your health needs are our priority.  As part of our continuing mission to provide you with exceptional heart care, we have created designated Provider Care Teams.  These Care Teams include your primary Cardiologist (physician) and Advanced Practice Providers (APPs -  Physician Assistants and Nurse Practitioners) who all work together to provide you with the care you need, when you need it.  We recommend signing up for the patient portal called "MyChart".  Sign up information is provided on this After Visit Summary.  MyChart is used to connect with patients for Virtual Visits (Telemedicine).  Patients are able to view lab/test results, encounter notes, upcoming appointments, etc.  Non-urgent messages can be sent to your provider as well.   To learn more about what you can do with MyChart, go to [email or web address].    Your next appointment:   6 month(s)  The format for your next appointment:   In Person  Provider:   Rozann Lesches, MD   Other Instructions None    Thank you for choosing [facility] !

## 2020-07-03 DIAGNOSIS — S72042D Displaced fracture of base of neck of left femur, subsequent encounter for closed fracture with routine healing: Secondary | ICD-10-CM | POA: Diagnosis not present

## 2020-07-03 DIAGNOSIS — M545 Low back pain, unspecified: Secondary | ICD-10-CM | POA: Diagnosis not present

## 2020-07-04 DIAGNOSIS — M545 Low back pain, unspecified: Secondary | ICD-10-CM | POA: Diagnosis not present

## 2020-07-15 ENCOUNTER — Telehealth: Payer: Self-pay | Admitting: Diagnostic Neuroimaging

## 2020-07-15 NOTE — Telephone Encounter (Signed)
Pt.'s wife Shauna Hugh is on [place]. She states per RN that husband is doing really good with his condition & eyes are doing good. The medicine is working.

## 2020-07-15 NOTE — Telephone Encounter (Signed)
Noted  

## 2020-07-17 ENCOUNTER — Telehealth: Payer: Self-pay | Admitting: Diagnostic Neuroimaging

## 2020-07-17 NOTE — Telephone Encounter (Signed)
Pt's wife called wanting to speak to the RN to see if the pt is to cut back on the Prednisone MG some more. Please advise.

## 2020-07-17 NOTE — Telephone Encounter (Signed)
Called [name], LVM informing her that per Dr Gladstone Lighter last note in Aug, he was to reduce prednisone by 10 mg per day each month. When he reaches 20 mg he is to stay at that dose. Left # for further questions.

## 2020-07-17 NOTE — Telephone Encounter (Signed)
Called wife ,no answer

## 2020-07-19 DIAGNOSIS — M961 Postlaminectomy syndrome, not elsewhere classified: Secondary | ICD-10-CM | POA: Diagnosis not present

## 2020-07-19 DIAGNOSIS — M5416 Radiculopathy, lumbar region: Secondary | ICD-10-CM | POA: Diagnosis not present

## 2020-07-21 DIAGNOSIS — M25552 Pain in left hip: Secondary | ICD-10-CM | POA: Diagnosis not present

## 2020-07-29 DIAGNOSIS — N281 Cyst of kidney, acquired: Secondary | ICD-10-CM | POA: Diagnosis not present

## 2020-07-29 DIAGNOSIS — R350 Frequency of micturition: Secondary | ICD-10-CM | POA: Diagnosis not present

## 2020-07-29 DIAGNOSIS — N200 Calculus of kidney: Secondary | ICD-10-CM | POA: Diagnosis not present

## 2020-07-29 DIAGNOSIS — R3915 Urgency of urination: Secondary | ICD-10-CM | POA: Diagnosis not present

## 2020-07-31 DIAGNOSIS — Z96642 Presence of left artificial hip joint: Secondary | ICD-10-CM | POA: Diagnosis not present

## 2020-07-31 DIAGNOSIS — S72042D Displaced fracture of base of neck of left femur, subsequent encounter for closed fracture with routine healing: Secondary | ICD-10-CM | POA: Diagnosis not present

## 2020-07-31 DIAGNOSIS — M48061 Spinal stenosis, lumbar region without neurogenic claudication: Secondary | ICD-10-CM | POA: Diagnosis not present

## 2020-07-31 DIAGNOSIS — Z23 Encounter for immunization: Secondary | ICD-10-CM | POA: Diagnosis not present

## 2020-07-31 DIAGNOSIS — M25552 Pain in left hip: Secondary | ICD-10-CM | POA: Diagnosis not present

## 2020-08-05 DIAGNOSIS — Z981 Arthrodesis status: Secondary | ICD-10-CM | POA: Diagnosis not present

## 2020-08-05 DIAGNOSIS — M545 Low back pain, unspecified: Secondary | ICD-10-CM | POA: Diagnosis not present

## 2020-08-05 DIAGNOSIS — M48062 Spinal stenosis, lumbar region with neurogenic claudication: Secondary | ICD-10-CM | POA: Diagnosis not present

## 2020-08-06 ENCOUNTER — Telehealth: Payer: Self-pay | Admitting: Diagnostic Neuroimaging

## 2020-08-06 NOTE — Telephone Encounter (Signed)
Pt.'s wife Shauna Hugh is on [place]. She states Dr. Rolena Infante from yesterday's visit stated pt. does not need to have surgery, but does need to see Dr. sooner. Pt. was scheduled for Jan. but wife asks if there is anything avail. sooner, to let them know.  Also to call on cell: [phone]

## 2020-08-06 NOTE — Telephone Encounter (Signed)
No sooner openings found. Patient is on wait list.

## 2020-08-07 NOTE — Telephone Encounter (Signed)
Called wife, [name] who stated Dr Maureen Ralphs sent patient to Dr Rolena Infante for injections in his hip. Dr Rolena Infante did x rays and told patient he doesn't need injections, but needs to see his neurologist. We moved FU sooner. [name]  verbalized understanding, appreciation.

## 2020-08-11 IMAGING — CT CT CHEST W/ CM
2 of 3 series · 15 of 36 positions shown, 18 images · IV contrast (omnipaque)
Comparison: CT abdomen 04/12/2018

CLINICAL DATA: Myasthenia gravis. Bloating, diarrhea, upper
abdominal pain, Burgo eyelid

EXAM:
CT CHEST WITH CONTRAST
TECHNIQUE: Multidetector CT imaging of the chest was performed during
intravenous contrast administration.
CONTRAST:  75mL OMNIPAQUE IOHEXOL 300 MG/ML  SOLN

[Series 2: routine chest with · axial · 0.77mm/px · z∈[+1214,+1510]mm · 12 of 174 slices shown, 15 images]
[im 13/174  mediastinal]
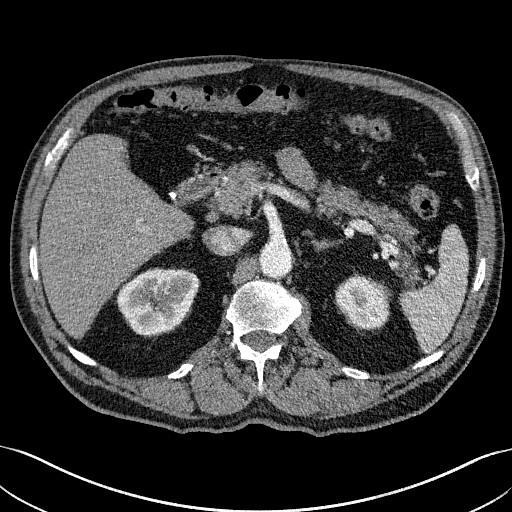
[im 13/174  lung]
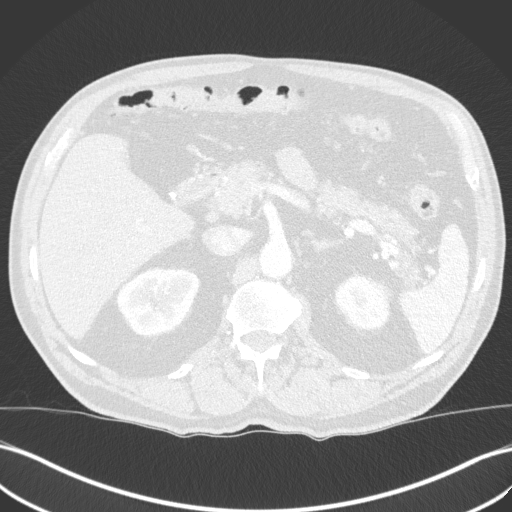
[im 26/174  lung]
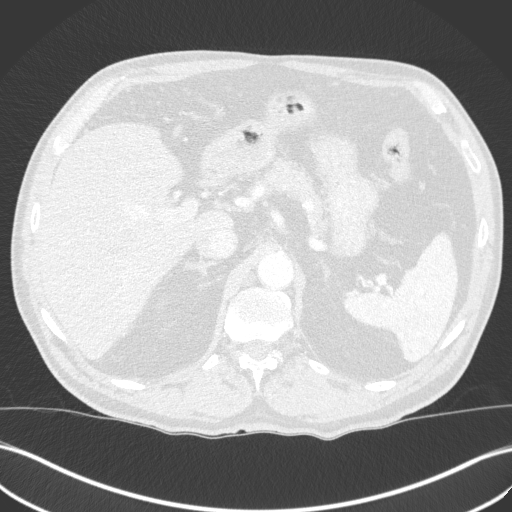
[im 39/174  lung]
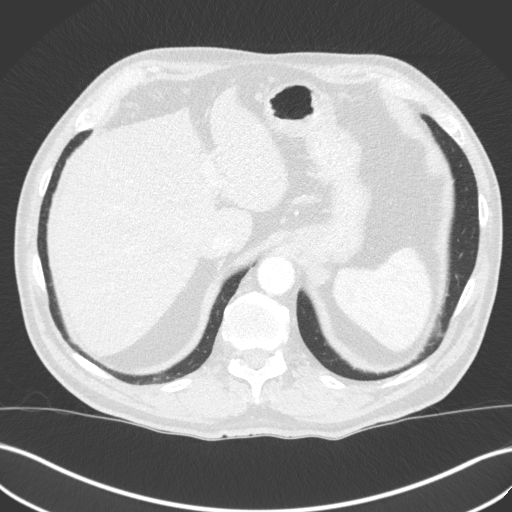
[im 52/174  lung]
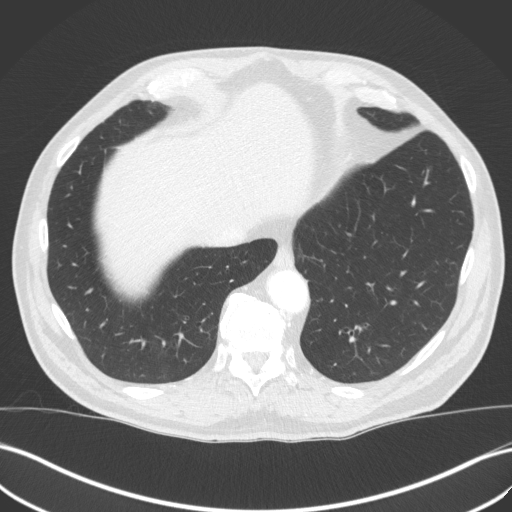
[im 65/174  mediastinal]
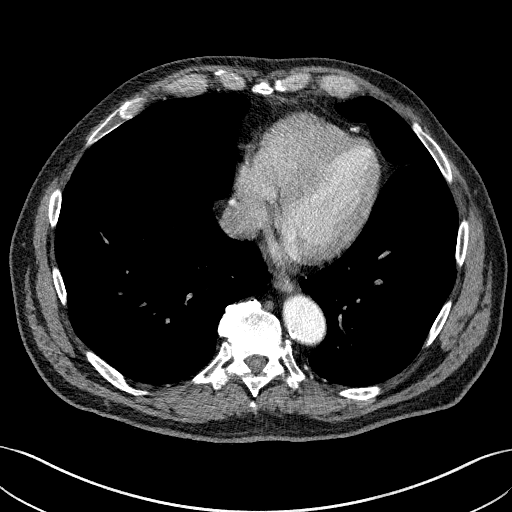
[im 65/174  lung]
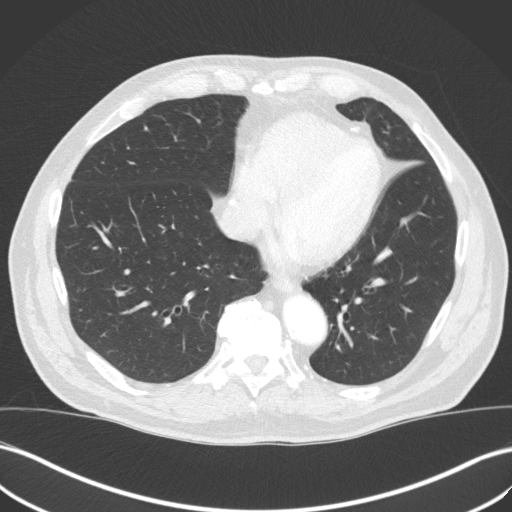
[im 77/174  lung]
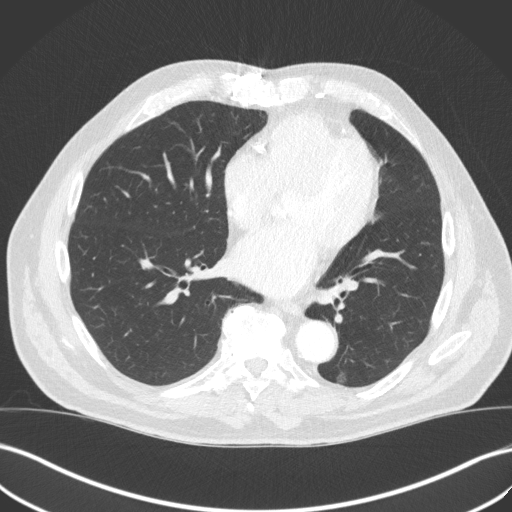
[im 97/174  lung]
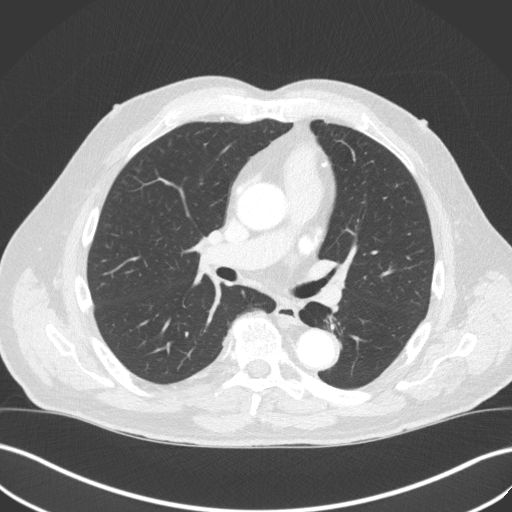
[im 109/174  lung]
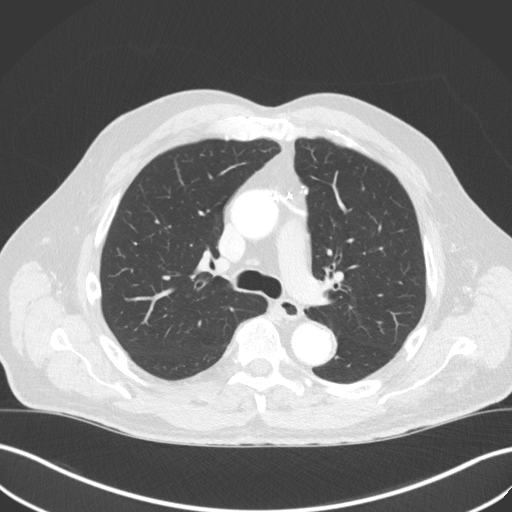
[im 122/174  mediastinal]
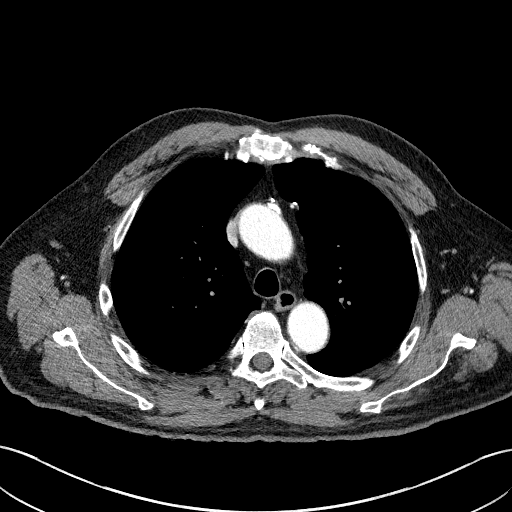
[im 122/174  lung]
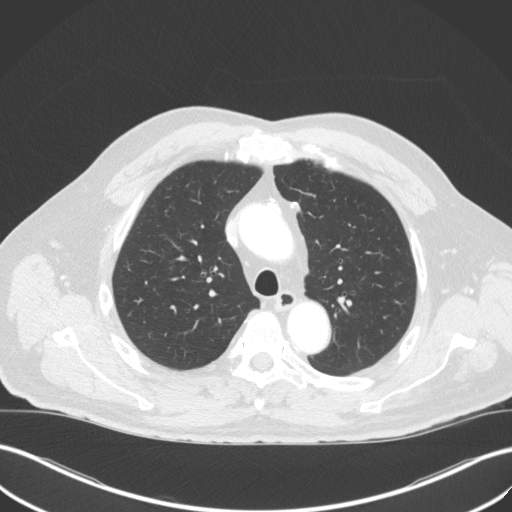
[im 135/174  lung]
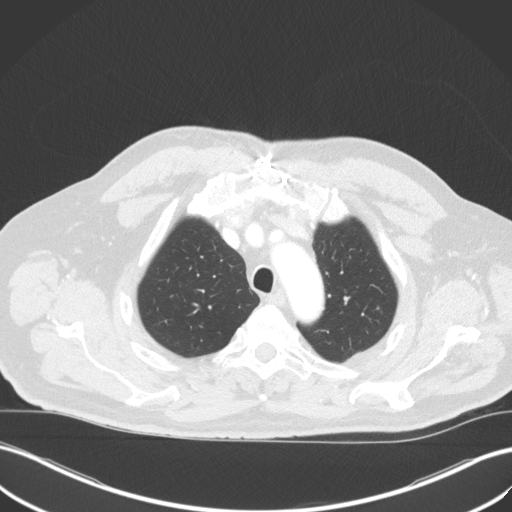
[im 148/174  lung]
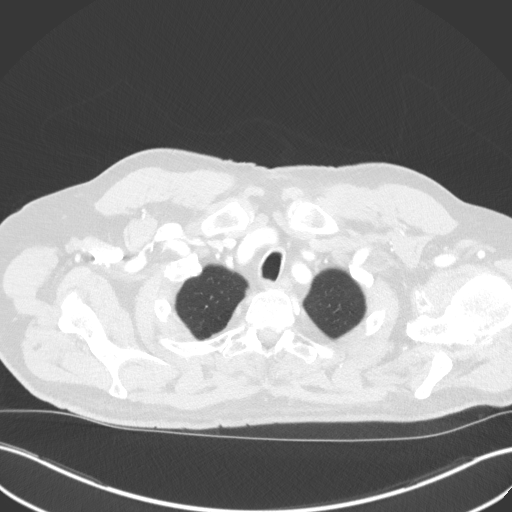
[im 161/174  lung]
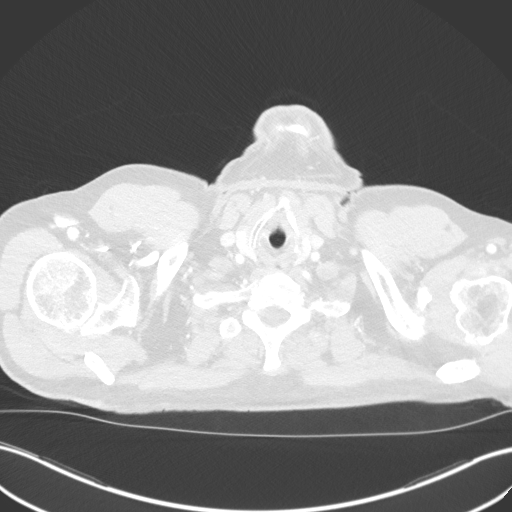

[Series 5: coronal · coronal · 0.72mm/px · 3 of 154 slices shown]
[im 31/154  lung]
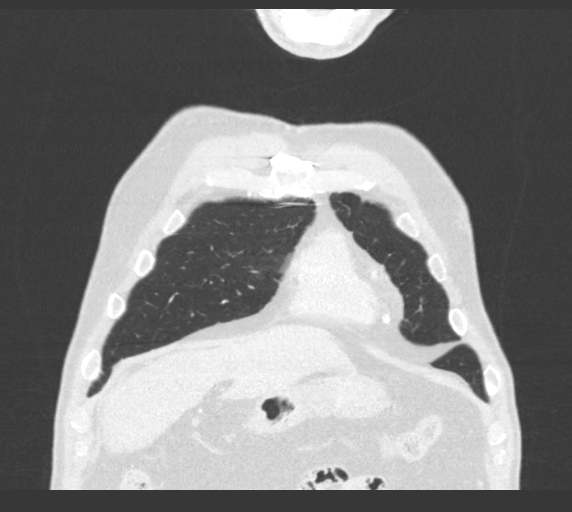
[im 62/154  lung]
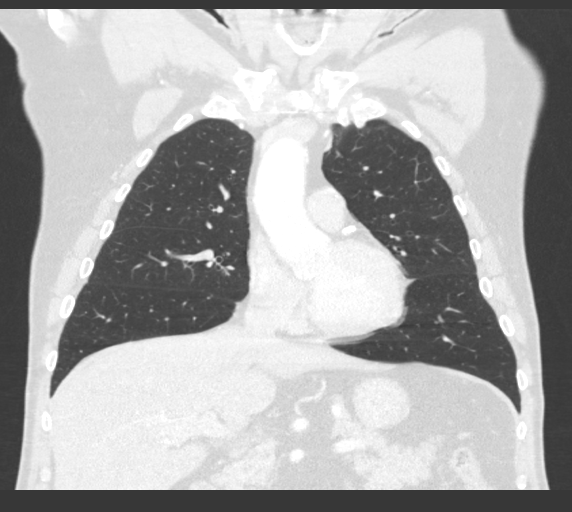
[im 92/154  lung]
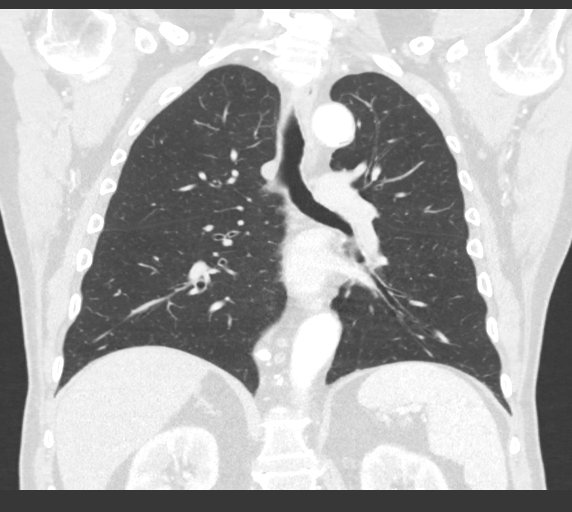

[15 of 36 positions shown; findings below may reference images not displayed]

FINDINGS: Cardiovascular: No significant vascular findings. Normal heart size.
No pericardial effusion. Prior CABG. Thoracic aorta is normal in
caliber. Mild thoracic aortic atherosclerosis.

Mediastinum/Nodes: No enlarged mediastinal, hilar, or axillary lymph
nodes. Thyroid gland, trachea, and esophagus demonstrate no
significant findings. No evidence of a thymoma or thymic mass.

Lungs/Pleura: No focal consolidation, pleural effusion or
pneumothorax. 4.5 mm left lower lobe pulmonary nodule.

Upper Abdomen: No acute abnormality.

Musculoskeletal: No acute osseous abnormality aggressive osseous
lesion.
IMPRESSION: 1. No thymic mass or thymoma.
2.  Aortic Atherosclerosis ([id number]-UE1.1).
3. 4.5 mm left lower lobe pulmonary nodule. This is unchanged
compared with 04/12/2018. No follow-up needed if patient is
low-risk. Non-contrast chest CT can be considered in 12 months if
patient is high-risk. This recommendation follows the consensus
statement: Guidelines for Management of Incidental Pulmonary Nodules
Detected on CT Images: From the [HOSPITAL] 8336; Radiology

## 2020-08-20 ENCOUNTER — Encounter: Payer: Self-pay | Admitting: Diagnostic Neuroimaging

## 2020-08-20 ENCOUNTER — Ambulatory Visit (INDEPENDENT_AMBULATORY_CARE_PROVIDER_SITE_OTHER): Payer: Medicare Other | Admitting: Diagnostic Neuroimaging

## 2020-08-20 VITALS — BP 128/76 | HR 65 | Ht 73.0 in | Wt 214.0 lb

## 2020-08-20 DIAGNOSIS — G7000 Myasthenia gravis without (acute) exacerbation: Secondary | ICD-10-CM

## 2020-08-20 NOTE — Patient Instructions (Signed)
GENERALIZED MYASTHENIA GRAVIS (stable) - continue pyridostigmine 60mg  three times a day; may consider to reduce (30mg  three times a day) - continue prednisone 20mg  daily; continue for now until back and hip issues are stable; if not tolerated, then may need to start imuran or cellcept - continue vitamin D + calcium supplement  LUMBAR SPINAL STENOSIS - follow up with Dr. and Dr. 

## 2020-08-20 NOTE — Progress Notes (Signed)
[facility]  [name] DOB: [birth year]  REFERRING CLINICIAN: Sharilyn Sites, MD HISTORY FROM: patient and wife  REASON FOR VISIT: follow up    HISTORICAL  CHIEF COMPLAINT:  Chief Complaint  Patient presents with  . Myasthenia Gravis    Rm 6, early FU, wife [name], "Dr Rolena Infante advised he needed to be seen sooner d/t soft stools, urinary issues"    HISTORY OF PRESENT ILLNESS:   UPDATE (08/21/19, VRP): Since last visit, doing well with myasthenia gravis --> symptoms are stable. Also seeing Dr. Maureen Ralphs, Dr. Rolena Infante, Dr. Nelva Bush for lumbar spinal stenosis. Had more low back pain (starting Aug 2021), few months of loose stools, frequent urination. No saddle anesthesia.  UPDATE (03/18/20, VRP): Since last visit, doing well from MG. Some side effects from prednisone (jittery, bloating). Symptoms are stable. Severity is mild. No alleviating or aggravating factors. Tolerating meds otherwise.   UPDATE (01/08/20, VRP): Since last visit, doing WELL. Symptoms are stable. No alleviating or aggravating factors. Tolerating meds. New left hip stress fracture since Dec 28, 2019. Using walker.   UPDATE (11/14/19, VRP): Since last visit, doing WELL; s/p IVIG in the hospital. Now on prednisone 60mg  daily. Symptoms are improved. No alleviating or aggravating factors. Tolerating meds.    UPDATE (10/31/19, VRP): Since last visit, doing poorly. Now with fatigue, SOB, hand weakness, leg weakness. Mestinon not helping (even up to 90mg  three times a day).  PRIOR HPI (09/24/19): 75 year old male here for evaluation of myasthenia gravis.  September 10, 2019 patient noticed right eye ptosis, progressing to left eye ptosis.  He was having some stomach pain, diarrhea, gas, and breathing issues around the same time.  [name], had [place] R antibody testing which was positive and diagnosed with myasthenia gravis.  Around the same time patient was with a GI doctor, diagnosed with  possible bacterial infection and treated with antibiotics.  He was also noted to be heme positive stool.  Since that time I symptoms are stable.  Symptoms are worse in the evening compared the morning.  [name].  Denies any shortness of breath.  Denies any speech or swallowing problems.  Eye drooping is severe enough that sometimes it obstructs his vision and he is not able to drive currently.   REVIEW OF SYSTEMS: Full 14 system review of systems performed and negative with exception of: as per HPI.    ALLERGIES: No Known Allergies  HOME MEDICATIONS: Outpatient Medications Prior to Visit  Medication Sig Dispense Refill  . acetaminophen (TYLENOL) 500 MG tablet Take 1,000 mg by mouth daily.    Marland Kitchen allopurinol (ZYLOPRIM) 300 MG tablet Take 150 mg by mouth daily.     Marland Kitchen amLODipine (NORVASC) 5 MG tablet Take 1 tablet (5 mg total) by mouth daily. 30 tablet 1  . aspirin EC 81 MG tablet Take 1 tablet (81 mg total) by mouth daily. Swallow whole. 90 tablet 3  . Calcium 600-200 MG-UNIT tablet Take 1 tablet by mouth daily.    Marland Kitchen HYDROcodone-acetaminophen (NORCO/VICODIN) 5-325 MG tablet Take 1-2 tablets by mouth every 6 (six) hours as needed for severe pain. 56 tablet 0  . metoprolol tartrate (LOPRESSOR) 25 MG tablet Take 0.5 tablets (12.5 mg total) by mouth 2 (two) times daily. 30 tablet 6  . Multiple Vitamin (MULTIVITAMIN) tablet Take 1 tablet by mouth daily.    . Omega-3 Fatty Acids (FISH OIL) 500 MG CAPS Take by mouth daily.    Marland Kitchen  pantoprazole (PROTONIX) 40 MG tablet Take 1 tablet (40 mg total) by mouth daily. 30 tablet 1  . pravastatin (PRAVACHOL) 40 MG tablet Take 40 mg by mouth every evening.     . predniSONE (DELTASONE) 10 MG tablet Take as directed; prednisone tapering (Patient taking differently: Take as directed; prednisone tapering 06/27/20 taking 20 mg qd) 150 tablet 12  . pyridostigmine (MESTINON) 60 MG tablet Take 1 tablet (60 mg total) by mouth 3 (three)  times daily. 90 tablet 12  . tamsulosin (FLOMAX) 0.4 MG CAPS capsule Take 0.4 mg by mouth 2 (two) times daily.     . traMADol (ULTRAM) 50 MG tablet Take 1-2 tablets (50-100 mg total) by mouth every 6 (six) hours as needed for moderate pain. 40 tablet 0  . UNABLE TO FIND Med Name: Osteo bioflex    . methocarbamol (ROBAXIN) 500 MG tablet Take 1 tablet (500 mg total) by mouth every 8 (eight) hours as needed for muscle spasms. (Patient not taking: Reported on 08/20/2020) 40 tablet 1  . nitroGLYCERIN (NITROSTAT) 0.4 MG SL tablet Place 1 tablet (0.4 mg total) under the tongue every 5 (five) minutes as needed. (Patient not taking: Reported on 08/20/2020) 25 tablet 3   No facility-administered medications prior to visit.    PAST MEDICAL HISTORY: Past Medical History:  Diagnosis Date  . Arthritis   . Cataract   . Coronary atherosclerosis of native coronary artery    Multivessel status post CABG March 2015  . DDD (degenerative disc disease), lumbar    Dr. Carloyn Manner  . Essential hypertension   . GERD (gastroesophageal reflux disease)   . Hard of hearing   . Hemorrhoids   . History of gout   . History of kidney stones   . History of [facility] spotted fever   . Hypertensive heart disease without heart failure   . Lumbago   . Melena   . Mixed hyperlipidemia   . Ocular myasthenia ([place])   . Plantar fascial fibromatosis   . Pleurodynia   . Postoperative atrial fibrillation [facility])    August 2021    PAST SURGICAL HISTORY: Past Surgical History:  Procedure Laterality Date  . AMPUTATION Right 03/27/2014   Procedure: DEBRIDEMENT AND CLOSURE RIGHT INDEX FINGER;  Surgeon: [name], MD;  Location: [place];  Service: Orthopedics;  Laterality: Right;  . BACK SURGERY    . BICEPT TENODESIS  11/26/2011   Procedure: BICEPT TENODESIS;  Surgeon: Augustin Schooling, MD;  Location: [place];  Service: Orthopedics;  Laterality: Left;  . BIOPSY  08/03/2018   Procedure: BIOPSY;  Surgeon: Rogene Houston, MD;   [facility];  Service: Endoscopy;;  antral  . Cataract surgery Left   . CHOLECYSTECTOMY N/A 09/22/2018   Procedure: LAPAROSCOPIC CHOLECYSTECTOMY;  Surgeon: Virl Cagey, MD;  [facility]: [facility];  Service: General;  Laterality: N/A;  . COLONOSCOPY    . COLONOSCOPY N/A 12/26/2014   Procedure: COLONOSCOPY;  Surgeon: Rogene Houston, MD;  [facility];  Service: Endoscopy;  Laterality: N/A;  730  . CORONARY ARTERY BYPASS GRAFT N/A 10/19/2013   Procedure: CORONARY ARTERY BYPASS GRAFTING (CABG);  Surgeon: Rexene Alberts, MD;  Location: [place];  Service: Open Heart Surgery;  Laterality: N/A;  CABG times four using left internal mammary artery and left leg saphenous vein, incision made on right leg but no vein removed  . CYSTOSCOPY WITH RETROGRADE PYELOGRAM, URETEROSCOPY AND STENT PLACEMENT Left 08/24/2016   Procedure: CYSTOSCOPY WITH LEFT  RETROGRADE PYELOGRAM, LEFT URETEROSCOPY, BASKET EXTRACTION LEFT URETERAL STONE;  Surgeon: Irine Seal, MD;  [facility];  Service: Urology;  Laterality: Left;  . ESOPHAGOGASTRODUODENOSCOPY N/A 08/03/2018   Procedure: ESOPHAGOGASTRODUODENOSCOPY (EGD);  Surgeon: Rogene Houston, MD;  [facility];  Service: Endoscopy;  Laterality: N/A;  2:55  . EXTRACORPOREAL SHOCK WAVE LITHOTRIPSY Right 06/18/2019   Procedure: EXTRACORPOREAL SHOCK WAVE LITHOTRIPSY (ESWL);  Surgeon: Lucas Mallow, MD;  [facility];  Service: Urology;  Laterality: Right;  . INTRAOPERATIVE TRANSESOPHAGEAL ECHOCARDIOGRAM N/A 10/19/2013   Procedure: INTRAOPERATIVE TRANSESOPHAGEAL ECHOCARDIOGRAM;  Surgeon: Rexene Alberts, MD;  Location: [place];  Service: Open Heart Surgery;  Laterality: N/A;  . JOINT REPLACEMENT    . LEFT HEART CATHETERIZATION WITH CORONARY ANGIOGRAM N/A 10/09/2013   Procedure: LEFT HEART CATHETERIZATION WITH CORONARY ANGIOGRAM;  Surgeon: Burnell Blanks, MD;  Location: [facility];  Service: Cardiovascular;  Laterality: N/A;  . Left shoulder  rotator cuff repair    . LITHOTRIPSY    . ORIF PERIPROSTHETIC FRACTURE Left 03/19/2020   Procedure: OPEN REDUCTION INTERNAL FIXATION (ORIF) PERIPROSTHETIC FRACTURE LEFT FEMUR WITH FEMORAL REVISION;  Surgeon: Gaynelle Arabian, MD;  [facility];  Service: Orthopedics;  Laterality: Left;  . TOTAL HIP ARTHROPLASTY Left 01/28/2020   Procedure: TOTAL HIP ARTHROPLASTY ANTERIOR APPROACH;  Surgeon: Gaynelle Arabian, MD;  [facility];  Service: Orthopedics;  Laterality: Left;  176min  . TOTAL KNEE ARTHROPLASTY Left   . traumaticvamputation of finger  2015  . VASECTOMY      FAMILY HISTORY: Family History  Problem Relation Age of Onset  . Hypertension Sister   . Lung cancer Father   . Heart failure Mother     SOCIAL HISTORY: Social History   Socioeconomic History  . Marital status: Married    Spouse name: [name]  . Number of children: 2  . Years of education: Not on file  . Highest education level: 10th grade  Occupational History  . Occupation: lumber mills  Tobacco Use  . Smoking status: Former Smoker    Packs/day: 2.00    Years: 30.00    Pack years: 60.00    Types: Cigarettes    Start date: 08/16/1958    Quit date: 08/17/1983    Years since quitting: 37.0  . Smokeless tobacco: Former Systems developer    Types: Chew  . Tobacco comment: quit smoking 98yrs ago  Vaping Use  . Vaping Use: Never used  Substance and Sexual Activity  . Alcohol use: Yes    Alcohol/week: 0.0 standard drinks    Comment: Occassional  . Drug use: No  . Sexual activity: Not on file  Other Topics Concern  . Not on file  Social History Narrative   Lives with wife   Caffeine- coffee 3- 4 c daily   Social Determinants of Health   Financial Resource Strain: Not on file  Food Insecurity: Not on file  Transportation Needs: Not on file  Physical Activity: Not on file  Stress: Not on file  Social Connections: Not on file  Intimate Partner Violence: Not on file     PHYSICAL EXAM  GENERAL  EXAM/CONSTITUTIONAL: Vitals:  Vitals:   08/20/20 1440  BP: 128/76  Pulse: 65  Weight: 214 lb (97.1 kg)  Height: 6\' 1"  (1.854 m)   Body mass index is 28.23 kg/m. Wt Readings from Last 10 Encounters:  08/20/20 214 lb (97.1 kg)  06/27/20 213 lb (96.6 kg)  04/03/20 211 lb 12.8 oz (96.1 kg)  03/19/20 202 lb 12.8 oz (92 kg)  03/18/20 202 lb 12.8 oz (92 kg)  01/28/20 206 lb 14.4 oz (93.9 kg)  01/25/20 206 lb 12.8 oz (93.8 kg)  01/24/20 207 lb 1 oz (93.9 kg)  01/24/20 207 lb (93.9 kg)  01/08/20 210 lb 6.4 oz (95.4 kg)    Patient is in no distress; well developed, nourished and groomed; neck is supple  CARDIOVASCULAR:  Examination of carotid arteries is normal; no carotid bruits  Regular rate and rhythm, no murmurs  Examination of peripheral vascular system by observation and palpation is normal  EYES:  Ophthalmoscopic exam of optic discs and posterior segments is normal; no papilledema or hemorrhages No exam data present  MUSCULOSKELETAL:  Gait, strength, tone, movements noted in Neurologic exam below  NEUROLOGIC: MENTAL STATUS:  No flowsheet data found.  awake, alert, oriented to person, place and time  recent and remote memory intact  normal attention and concentration  language fluent, comprehension intact, naming intact  fund of knowledge appropriate  CRANIAL NERVE:   2nd - no papilledema on fundoscopic exam  2nd, 3rd, 4th, 6th - pupils equal and reactive to light, visual fields full to confrontation, extraocular muscles intact, no nystagmus  5th - facial sensation symmetric  7th - facial strength symmetric  8th - hearing --> REDUCED  9th - palate elevates symmetrically, uvula midline  11th - shoulder shrug symmetric  12th - tongue protrusion midline   MOTOR:   normal bulk and tone  BUE 5; BLE 5 EXCEPT LEFT HIP FLEXION 4 (LIMITED BY PAIN)  SENSORY:   normal and symmetric to light touch, temperature, vibration  COORDINATION:    finger-nose-finger, fine finger movements normal  REFLEXES:   deep tendon reflexes TRACE and symmetric  GAIT/STATION:   narrow based gait; USING CANE     DIAGNOSTIC DATA (LABS, IMAGING, TESTING) - I reviewed patient records, labs, notes, testing and imaging myself where available.  Lab Results  Component Value Date   WBC 9.7 03/22/2020   HGB 9.3 (L) 03/22/2020   HCT 29.7 (L) 03/22/2020   MCV 98.0 03/22/2020   PLT 151 03/22/2020      Component Value Date/Time   NA 137 03/19/2020 1218   NA 138 06/28/2019 0000   K 4.9 03/19/2020 1218   CL 102 03/19/2020 1218   CO2 22 03/19/2020 1218   GLUCOSE 137 (H) 03/19/2020 1218   BUN 22 03/19/2020 1218   BUN 20 06/28/2019 0000   CREATININE 1.17 03/19/2020 1218   CALCIUM 8.9 03/19/2020 1218   PROT 6.4 (L) 03/19/2020 1218   ALBUMIN 3.9 03/19/2020 1218   AST 45 (H) 03/19/2020 1218   ALT 34 03/19/2020 1218   ALKPHOS 80 03/19/2020 1218   BILITOT 0.8 03/19/2020 1218   GFRNONAA >60 03/19/2020 1218   GFRAA >60 03/19/2020 1218   Lab Results  Component Value Date   CHOL 134 06/28/2019   HDL 49 06/28/2019   LDLCALC 62 06/28/2019   TRIG 157 06/28/2019   CHOLHDL 2.4 12/06/2013   Lab Results  Component Value Date   HGBA1C 6.0 (H) 03/20/2020   No results found for: VITAMINB12 Lab Results  Component Value Date   TSH 0.981 03/21/2020    09/17/19 AchR ab - 42 HIGH  10/15/19 CT chest 1. No thymic mass or thymoma. 2.  Aortic Atherosclerosis (ICD10-I70.0). 3. 4.5 mm left lower lobe pulmonary nodule. This is unchanged compared with 04/12/2018. No follow-up needed if patient is low-risk. Non-contrast chest CT can be considered  in 12 months if patient is high-risk. This recommendation follows the consensus statement: Guidelines for Management of Incidental Pulmonary Nodules Detected on CT Images: From the Fleischner Society 2017; Radiology 2017; [id number].  07/10/09 MRI lumbar spine 1. Moderately severe spinal stenosis at  L4-5 due to severe  bilateral facet joint arthritis and a 6 mm spondylolisthesis of L4  on L5.  2. Diffuse bulging of the uncovered L4-5 disc with a herniation of  disc material into the right neural foramen compressing the right  L4 nerve root under the right pedicle.  3. Severe right lateral recess stenosis at L4-5. The right L5  nerve root appears compressed by the posterior element hypertrophy  in the right lateral recess.    ASSESSMENT AND PLAN  75 y.o. year old male here with new onset ptosis, blurred vision, generalized weakness with positive ACH antibody, consistent with generalized myasthenia gravis.    Dx:  1. Myasthenia gravis without (acute) exacerbation (HCC)      PLAN:  GENERALIZED MYASTHENIA GRAVIS (stable) - continue pyridostigmine 60mg  three times a day; may consider to reduce (30mg  three times a day) - completed IVIG x 5 days (in hospital; March 2021) - continue prednisone 20mg  daily; continue for now until back and hip issues are stable; if not tolerated, then may need to start imuran or cellcept - continue vitamin D + calcium supplement  LUMBAR SPINAL STENOSIS - follow up with Dr. and Dr. April 2021  Return in about 6 months (around 02/17/2021).    Shon Baton, MD 08/20/2020, 3:02 PM Certified in Neurology, Neurophysiology and Neuroimaging  [facility] [address] [place], [facility] [address] [phone]

## 2020-08-28 DIAGNOSIS — M5416 Radiculopathy, lumbar region: Secondary | ICD-10-CM | POA: Diagnosis not present

## 2020-08-28 DIAGNOSIS — M961 Postlaminectomy syndrome, not elsewhere classified: Secondary | ICD-10-CM | POA: Diagnosis not present

## 2020-09-01 ENCOUNTER — Ambulatory Visit: Payer: Medicare Other | Admitting: Diagnostic Neuroimaging

## 2020-09-02 DIAGNOSIS — J019 Acute sinusitis, unspecified: Secondary | ICD-10-CM | POA: Diagnosis not present

## 2020-09-18 DIAGNOSIS — M5416 Radiculopathy, lumbar region: Secondary | ICD-10-CM | POA: Diagnosis not present

## 2020-09-18 DIAGNOSIS — M961 Postlaminectomy syndrome, not elsewhere classified: Secondary | ICD-10-CM | POA: Diagnosis not present

## 2020-09-23 ENCOUNTER — Ambulatory Visit: Payer: Medicare Other | Admitting: Diagnostic Neuroimaging

## 2020-10-02 DIAGNOSIS — M5416 Radiculopathy, lumbar region: Secondary | ICD-10-CM | POA: Diagnosis not present

## 2020-10-16 DIAGNOSIS — M5416 Radiculopathy, lumbar region: Secondary | ICD-10-CM | POA: Diagnosis not present

## 2020-10-20 DIAGNOSIS — Z961 Presence of intraocular lens: Secondary | ICD-10-CM | POA: Diagnosis not present

## 2020-10-20 DIAGNOSIS — G7000 Myasthenia gravis without (acute) exacerbation: Secondary | ICD-10-CM | POA: Diagnosis not present

## 2020-10-20 DIAGNOSIS — H02834 Dermatochalasis of left upper eyelid: Secondary | ICD-10-CM | POA: Diagnosis not present

## 2020-10-20 DIAGNOSIS — H02831 Dermatochalasis of right upper eyelid: Secondary | ICD-10-CM | POA: Diagnosis not present

## 2020-10-27 DIAGNOSIS — M5442 Lumbago with sciatica, left side: Secondary | ICD-10-CM | POA: Diagnosis not present

## 2020-10-27 DIAGNOSIS — M5136 Other intervertebral disc degeneration, lumbar region: Secondary | ICD-10-CM | POA: Diagnosis not present

## 2020-10-27 DIAGNOSIS — M5416 Radiculopathy, lumbar region: Secondary | ICD-10-CM | POA: Diagnosis not present

## 2020-10-27 DIAGNOSIS — I10 Essential (primary) hypertension: Secondary | ICD-10-CM | POA: Diagnosis not present

## 2020-10-27 DIAGNOSIS — Z6829 Body mass index (BMI) 29.0-29.9, adult: Secondary | ICD-10-CM | POA: Diagnosis not present

## 2020-10-27 DIAGNOSIS — M4120 Other idiopathic scoliosis, site unspecified: Secondary | ICD-10-CM | POA: Diagnosis not present

## 2020-10-27 DIAGNOSIS — G8929 Other chronic pain: Secondary | ICD-10-CM | POA: Diagnosis not present

## 2020-10-27 DIAGNOSIS — M4316 Spondylolisthesis, lumbar region: Secondary | ICD-10-CM | POA: Diagnosis not present

## 2020-11-03 DIAGNOSIS — M48062 Spinal stenosis, lumbar region with neurogenic claudication: Secondary | ICD-10-CM | POA: Diagnosis not present

## 2020-11-03 DIAGNOSIS — M5416 Radiculopathy, lumbar region: Secondary | ICD-10-CM | POA: Diagnosis not present

## 2020-11-05 DIAGNOSIS — D225 Melanocytic nevi of trunk: Secondary | ICD-10-CM | POA: Diagnosis not present

## 2020-11-05 DIAGNOSIS — D485 Neoplasm of uncertain behavior of skin: Secondary | ICD-10-CM | POA: Diagnosis not present

## 2020-11-05 DIAGNOSIS — L814 Other melanin hyperpigmentation: Secondary | ICD-10-CM | POA: Diagnosis not present

## 2020-11-05 DIAGNOSIS — L821 Other seborrheic keratosis: Secondary | ICD-10-CM | POA: Diagnosis not present

## 2020-11-05 DIAGNOSIS — Z86018 Personal history of other benign neoplasm: Secondary | ICD-10-CM | POA: Diagnosis not present

## 2020-11-05 DIAGNOSIS — Z85828 Personal history of other malignant neoplasm of skin: Secondary | ICD-10-CM | POA: Diagnosis not present

## 2020-11-05 DIAGNOSIS — L570 Actinic keratosis: Secondary | ICD-10-CM | POA: Diagnosis not present

## 2020-11-05 DIAGNOSIS — L578 Other skin changes due to chronic exposure to nonionizing radiation: Secondary | ICD-10-CM | POA: Diagnosis not present

## 2020-11-05 DIAGNOSIS — C44629 Squamous cell carcinoma of skin of left upper limb, including shoulder: Secondary | ICD-10-CM | POA: Diagnosis not present

## 2020-11-11 ENCOUNTER — Other Ambulatory Visit: Payer: Self-pay

## 2020-11-11 ENCOUNTER — Ambulatory Visit (INDEPENDENT_AMBULATORY_CARE_PROVIDER_SITE_OTHER): Payer: Medicare Other | Admitting: Internal Medicine

## 2020-11-11 ENCOUNTER — Encounter (INDEPENDENT_AMBULATORY_CARE_PROVIDER_SITE_OTHER): Payer: Self-pay | Admitting: Internal Medicine

## 2020-11-11 VITALS — BP 151/87 | HR 58 | Temp 98.5°F | Ht 73.0 in | Wt 219.7 lb

## 2020-11-11 DIAGNOSIS — K219 Gastro-esophageal reflux disease without esophagitis: Secondary | ICD-10-CM | POA: Diagnosis not present

## 2020-11-11 DIAGNOSIS — Z86010 Personal history of colonic polyps: Secondary | ICD-10-CM

## 2020-11-11 NOTE — Patient Instructions (Signed)
Notify if diarrhea or flatulence recurs. We will plan colonoscopy after visit next year unless you experience rectal bleeding.

## 2020-11-11 NOTE — Progress Notes (Signed)
Presenting complaint;  Follow-up for GERD.  History of colonic adenomas.  Database and subjective:  Patient is 75 year old Caucasian male who is here for yearly visit.  He has history of GERD and colonic adenomas.  He experienced an episode of melena diarrhea and intractable flatulence for which he was seen in February 2021.  NSAID was discontinued.  He was treated with PPI Cipro and metronidazole with resolution of his diarrhea and flatulence.  Plans were made for him to undergo surveillance colonoscopy but the meantime he developed myasthenia gravis and colonoscopy was postponed.  He states he is doing well.  He remains on prednisone 20 mg daily.  He is under care of Dr. [name] for his myasthenia gravis.  He has gained 14 pounds since his last visit of 11/12/2019.  His wife states his appetite has increased great deal since he has been on prednisone.  Patient states his baseline weight is 2 1 2  pounds. He feels heartburn is well controlled with pantoprazole.  He denies dysphagia nausea vomiting or regurgitation.  He is having formed stools daily.  Occasionally he may have a urgency.  He has ruptured disc between L4 and 5.  He is receiving shots to his spine.  He had fusion at L2, L3 and L4 level in December 2016.  He has daily pain.  He takes hydrocodone no more than once in a month.   Current Medications: Outpatient Encounter Medications as of 11/11/2020  Medication Sig  . acetaminophen (TYLENOL) 500 MG tablet Take 1,000 mg by mouth daily.  Marland Kitchen allopurinol (ZYLOPRIM) 300 MG tablet Take 150 mg by mouth daily.   Marland Kitchen amLODipine (NORVASC) 5 MG tablet Take 1 tablet (5 mg total) by mouth daily.  . Ascorbic Acid (VITAMIN C) 1000 MG tablet Take 1,000 mg by mouth daily.  Marland Kitchen aspirin EC 81 MG tablet Take 1 tablet (81 mg total) by mouth daily. Swallow whole.  . Calcium 600-200 MG-UNIT tablet Take 1 tablet by mouth daily.  Marland Kitchen HYDROcodone-acetaminophen (NORCO/VICODIN) 5-325 MG tablet Take 1-2 tablets by mouth  every 6 (six) hours as needed for severe pain.  . methocarbamol (ROBAXIN) 500 MG tablet Take 1 tablet (500 mg total) by mouth every 8 (eight) hours as needed for muscle spasms.  . metoprolol tartrate (LOPRESSOR) 25 MG tablet Take 0.5 tablets (12.5 mg total) by mouth 2 (two) times daily.  . Multiple Vitamin (MULTIVITAMIN) tablet Take 1 tablet by mouth daily.  . nitroGLYCERIN (NITROSTAT) 0.4 MG SL tablet Place 1 tablet (0.4 mg total) under the tongue every 5 (five) minutes as needed.  . Omega-3 Fatty Acids (FISH OIL) 500 MG CAPS Take by mouth daily.  . pantoprazole (PROTONIX) 40 MG tablet Take 1 tablet (40 mg total) by mouth daily.  . pravastatin (PRAVACHOL) 40 MG tablet Take 40 mg by mouth every evening.   . predniSONE (DELTASONE) 10 MG tablet Take as directed; prednisone tapering (Patient taking differently: Take as directed; prednisone tapering 06/27/20 taking 20 mg qd)  . pyridostigmine (MESTINON) 60 MG tablet Take 1 tablet (60 mg total) by mouth 3 (three) times daily.  . tamsulosin (FLOMAX) 0.4 MG CAPS capsule Take 0.4 mg by mouth 2 (two) times daily.   . traMADol (ULTRAM) 50 MG tablet Take 1-2 tablets (50-100 mg total) by mouth every 6 (six) hours as needed for moderate pain.  Marland Kitchen UNABLE TO FIND Med Name: Osteo bioflex   No facility-administered encounter medications on file as of 11/11/2020.     Objective: Blood pressure (!) 151/87,  pulse (!) 58, temperature 98.5 F (36.9 C), temperature source Oral, height 6\' 1"  (1.854 m), weight 219 lb 11.2 oz (99.7 kg). Patient is alert and in no acute distress. He is wearing a mask. He does not have ptosis. Conjunctiva is pink. Sclera is nonicteric Oropharyngeal mucosa is normal. No neck masses or thyromegaly noted. Cardiac exam with regular rhythm normal S1 and S2. No murmur or gallop noted. Lungs are clear to auscultation. Abdomen is full.  He has small umbilical hernia which is completely reducible.  It is nontender.  Abdomen is soft and  nontender as well.  No organomegaly or masses. No LE edema or clubbing noted.   Assessment:  #1.  Chronic GERD.  He is doing well with therapy.  #2.  History of colonic adenomas.  He had 2 small adenomas removed in March 2016.  According to new guidelines he could wait another year if he wants to.  His family history is negative for CRC.  He is not having any diarrhea or rectal bleeding.  #3.  He was diagnosed with myasthenia gravis last year.  He is not having any GI issues particularly dysphagia.   Plan:  Patient will continue pantoprazole 40 mg daily. Patient needs to watch calorie intake so that he would not keep gaining weight. Surveillance colonoscopy next year unless he has rectal bleeding or diarrhea relapses. Office visit in 1 year.

## 2020-11-17 DIAGNOSIS — I10 Essential (primary) hypertension: Secondary | ICD-10-CM | POA: Diagnosis not present

## 2020-11-17 DIAGNOSIS — M4120 Other idiopathic scoliosis, site unspecified: Secondary | ICD-10-CM | POA: Diagnosis not present

## 2020-11-17 DIAGNOSIS — Z6829 Body mass index (BMI) 29.0-29.9, adult: Secondary | ICD-10-CM | POA: Diagnosis not present

## 2020-11-17 DIAGNOSIS — M5416 Radiculopathy, lumbar region: Secondary | ICD-10-CM | POA: Diagnosis not present

## 2020-11-17 DIAGNOSIS — M48062 Spinal stenosis, lumbar region with neurogenic claudication: Secondary | ICD-10-CM | POA: Diagnosis not present

## 2020-12-25 DIAGNOSIS — M48062 Spinal stenosis, lumbar region with neurogenic claudication: Secondary | ICD-10-CM | POA: Diagnosis not present

## 2021-01-06 HISTORY — PX: OTHER SURGICAL HISTORY: SHX169

## 2021-01-07 DIAGNOSIS — C44629 Squamous cell carcinoma of skin of left upper limb, including shoulder: Secondary | ICD-10-CM | POA: Diagnosis not present

## 2021-01-14 HISTORY — PX: SKIN CANCER EXCISION: SHX779

## 2021-01-16 ENCOUNTER — Other Ambulatory Visit: Payer: Self-pay

## 2021-01-16 ENCOUNTER — Encounter: Payer: Self-pay | Admitting: Cardiology

## 2021-01-16 ENCOUNTER — Ambulatory Visit (INDEPENDENT_AMBULATORY_CARE_PROVIDER_SITE_OTHER): Payer: Medicare Other | Admitting: Cardiology

## 2021-01-16 VITALS — BP 134/72 | HR 68 | Ht 73.0 in | Wt 217.0 lb

## 2021-01-16 DIAGNOSIS — E782 Mixed hyperlipidemia: Secondary | ICD-10-CM

## 2021-01-16 DIAGNOSIS — I25119 Atherosclerotic heart disease of native coronary artery with unspecified angina pectoris: Secondary | ICD-10-CM | POA: Diagnosis not present

## 2021-01-16 DIAGNOSIS — Z8679 Personal history of other diseases of the circulatory system: Secondary | ICD-10-CM

## 2021-01-16 NOTE — Patient Instructions (Signed)

## 2021-01-16 NOTE — Progress Notes (Signed)
Cardiology Office Note  Date: 01/16/2021   ID: Brandon [name], DOB 23-Nov-1945, MRN [id number]  PCP:  Sharilyn Sites, MD  Cardiologist:  Rozann Lesches, MD Electrophysiologist:  None   Chief Complaint  Patient presents with  . Cardiac follow-up    History of Present Illness: Brandon [name] is a 75 y.o. male last seen in November 2021.  He is here with his wife for a follow-up visit.  Reports no active cardiac symptoms on current medications.  Mainly limited by lower back and leg pain due to spinal stenosis.  He has follow-up with Dr. Vertell Limber pending.  Anticipates potential surgery.  Pain injections have not been effective.  He is using a walker, no falls.  I reviewed his cardiac medications, he does not report any active nitroglycerin use.  ECG today shows sinus rhythm with increased voltage and old inferior infarct pattern.  Does not report any palpitations to suggest breakthrough atrial fibrillation, no longer anticoagulated at this time.  Past Medical History:  Diagnosis Date  . Arthritis   . Cataract   . Coronary atherosclerosis of native coronary artery    Multivessel status post CABG March 2015  . DDD (degenerative disc disease), lumbar    Dr. Carloyn Manner  . Essential hypertension   . GERD (gastroesophageal reflux disease)   . Hard of hearing   . Hemorrhoids   . History of gout   . History of kidney stones   . History of [facility] spotted fever   . Hypertensive heart disease without heart failure   . Lumbago   . Melena   . Mixed hyperlipidemia   . Ocular myasthenia ([place])   . Plantar fascial fibromatosis   . Pleurodynia   . Postoperative atrial fibrillation [facility])    August 2021    Past Surgical History:  Procedure Laterality Date  . AMPUTATION Right 03/27/2014   Procedure: DEBRIDEMENT AND CLOSURE RIGHT INDEX FINGER;  Surgeon: [name], MD;  Location: [place];  Service: Orthopedics;  Laterality: Right;  . BACK SURGERY    . BICEPT TENODESIS  11/26/2011    Procedure: BICEPT TENODESIS;  Surgeon: Augustin Schooling, MD;  Location: [place];  Service: Orthopedics;  Laterality: Left;  . BIOPSY  08/03/2018   Procedure: BIOPSY;  Surgeon: Rogene Houston, MD;  [facility];  Service: Endoscopy;;  antral  . Cataract surgery Left   . CHOLECYSTECTOMY N/A 09/22/2018   Procedure: LAPAROSCOPIC CHOLECYSTECTOMY;  Surgeon: Virl Cagey, MD;  Location: [facility];  Service: General;  Laterality: N/A;  . COLONOSCOPY    . COLONOSCOPY N/A 12/26/2014   Procedure: COLONOSCOPY;  Surgeon: Rogene Houston, MD;  [facility];  Service: Endoscopy;  Laterality: N/A;  730  . CORONARY ARTERY BYPASS GRAFT N/A 10/19/2013   Procedure: CORONARY ARTERY BYPASS GRAFTING (CABG);  Surgeon: Rexene Alberts, MD;  Location: [place];  Service: Open Heart Surgery;  Laterality: N/A;  CABG times four using left internal mammary artery and left leg saphenous vein, incision made on right leg but no vein removed  . CYSTOSCOPY WITH RETROGRADE PYELOGRAM, URETEROSCOPY AND STENT PLACEMENT Left 08/24/2016   Procedure: CYSTOSCOPY WITH LEFT RETROGRADE PYELOGRAM, LEFT URETEROSCOPY, BASKET EXTRACTION LEFT URETERAL STONE;  Surgeon: Irine Seal, MD;  [facility];  Service: Urology;  Laterality: Left;  . ESOPHAGOGASTRODUODENOSCOPY N/A 08/03/2018   Procedure: ESOPHAGOGASTRODUODENOSCOPY (EGD);  Surgeon: Rogene Houston, MD;  [facility];  Service: Endoscopy;  Laterality: N/A;  2:55  .  EXTRACORPOREAL SHOCK WAVE LITHOTRIPSY Right 06/18/2019   Procedure: EXTRACORPOREAL SHOCK WAVE LITHOTRIPSY (ESWL);  Surgeon: Lucas Mallow, MD;  [facility];  Service: Urology;  Laterality: Right;  . INTRAOPERATIVE TRANSESOPHAGEAL ECHOCARDIOGRAM N/A 10/19/2013   Procedure: INTRAOPERATIVE TRANSESOPHAGEAL ECHOCARDIOGRAM;  Surgeon: Rexene Alberts, MD;  Location: [place];  Service: Open Heart Surgery;  Laterality: N/A;  . JOINT REPLACEMENT    . LEFT HEART CATHETERIZATION WITH CORONARY ANGIOGRAM N/A  10/09/2013   Procedure: LEFT HEART CATHETERIZATION WITH CORONARY ANGIOGRAM;  Surgeon: Burnell Blanks, MD;  Location: [facility] CATH LAB;  Service: Cardiovascular;  Laterality: N/A;  . Left shoulder rotator cuff repair    . LITHOTRIPSY    . ORIF PERIPROSTHETIC FRACTURE Left 03/19/2020   Procedure: OPEN REDUCTION INTERNAL FIXATION (ORIF) PERIPROSTHETIC FRACTURE LEFT FEMUR WITH FEMORAL REVISION;  Surgeon: Gaynelle Arabian, MD;  [facility];  Service: Orthopedics;  Laterality: Left;  . TOTAL HIP ARTHROPLASTY Left 01/28/2020   Procedure: TOTAL HIP ARTHROPLASTY ANTERIOR APPROACH;  Surgeon: Gaynelle Arabian, MD;  Location: [facility];  Service: Orthopedics;  Laterality: Left;  177min  . TOTAL KNEE ARTHROPLASTY Left   . traumaticvamputation of finger  2015  . VASECTOMY      Current Outpatient Medications  Medication Sig Dispense Refill  . acetaminophen (TYLENOL) 500 MG tablet Take 1,000 mg by mouth daily.    Marland Kitchen allopurinol (ZYLOPRIM) 300 MG tablet Take 150 mg by mouth daily.     Marland Kitchen amLODipine (NORVASC) 5 MG tablet Take 1 tablet (5 mg total) by mouth daily. 30 tablet 1  . Ascorbic Acid (VITAMIN C) 1000 MG tablet Take 1,000 mg by mouth daily.    Marland Kitchen aspirin EC 81 MG tablet Take 1 tablet (81 mg total) by mouth daily. Swallow whole. 90 tablet 3  . Calcium 600-200 MG-UNIT tablet Take 1 tablet by mouth daily.    Marland Kitchen HYDROcodone-acetaminophen (NORCO/VICODIN) 5-325 MG tablet Take 1-2 tablets by mouth every 6 (six) hours as needed for severe pain. 56 tablet 0  . methocarbamol (ROBAXIN) 500 MG tablet Take 1 tablet (500 mg total) by mouth every 8 (eight) hours as needed for muscle spasms. 40 tablet 1  . metoprolol tartrate (LOPRESSOR) 25 MG tablet Take 0.5 tablets (12.5 mg total) by mouth 2 (two) times daily. 30 tablet 6  . Multiple Vitamin (MULTIVITAMIN) tablet Take 1 tablet by mouth daily.    . nitroGLYCERIN (NITROSTAT) 0.4 MG SL tablet Place 1 tablet (0.4 mg total) under the tongue every 5 (five) minutes as needed. 25  tablet 3  . Omega-3 Fatty Acids (FISH OIL) 500 MG CAPS Take by mouth daily.    . pantoprazole (PROTONIX) 40 MG tablet Take 1 tablet (40 mg total) by mouth daily. 30 tablet 1  . pravastatin (PRAVACHOL) 40 MG tablet Take 40 mg by mouth every evening.     . predniSONE (DELTASONE) 10 MG tablet Take as directed; prednisone tapering (Patient taking differently: Take as directed; prednisone tapering 06/27/20 taking 20 mg qd) 150 tablet 12  . pyridostigmine (MESTINON) 60 MG tablet Take 1 tablet (60 mg total) by mouth 3 (three) times daily. 90 tablet 12  . tamsulosin (FLOMAX) 0.4 MG CAPS capsule Take 0.4 mg by mouth 2 (two) times daily.     Marland Kitchen UNABLE TO FIND Med Name: Osteo bioflex     No current facility-administered medications for this visit.   Allergies:  Patient has no known allergies.   ROS: No dizziness or syncope.  Physical Exam: VS:  BP 134/72  Pulse 68   Ht 6\' 1"  (1.854 m)   Wt 217 lb (98.4 kg)   SpO2 96%   BMI 28.63 kg/m , BMI Body mass index is 28.63 kg/m.  Wt Readings from Last 3 Encounters:  01/16/21 217 lb (98.4 kg)  11/11/20 219 lb 11.2 oz (99.7 kg)  08/20/20 214 lb (97.1 kg)    General: Patient appears comfortable at rest. HEENT: Conjunctiva and lids normal, wearing a mask. Neck: Supple, no elevated JVP or carotid bruits, no thyromegaly. Lungs: Clear to auscultation, nonlabored breathing at rest. Cardiac: Regular rate and rhythm, no S3 or significant systolic murmur, no pericardial rub. Extremities: No pitting edema.  ECG:  An ECG dated 04/03/2020 was personally reviewed today and demonstrated:  Sinus rhythm with old inferior infarct pattern.  Recent Labwork: 03/19/2020: ALT 34; AST 45; BUN 22; Creatinine, Ser 1.17; Potassium 4.9; Sodium 137 03/21/2020: TSH 0.981 03/22/2020: Hemoglobin 9.3; Platelets 151     Component Value Date/Time   CHOL 134 06/28/2019 0000   TRIG 157 06/28/2019 0000   HDL 49 06/28/2019 0000   CHOLHDL 2.4 12/06/2013 0757   VLDL 18 12/06/2013 0757    LDLCALC 62 06/28/2019 0000    Other Studies Reviewed Today:  Echocardiogram 03/20/2020: 1. Left ventricular ejection fraction, by estimation, is 50 to 55%. The  left ventricle has low normal function. The left ventricle has no regional  wall motion abnormalities. There is mild left ventricular hypertrophy of  the basal-septal segment. Left  ventricular diastolic function could not be evaluated.  2. Right ventricular systolic function is normal. The right ventricular  size is normal. Tricuspid regurgitation signal is inadequate for assessing  PA pressure.  3. Left atrial size was severely dilated.  4. The mitral valve is normal in structure. Trivial mitral valve  regurgitation. No evidence of mitral stenosis.  5. The aortic valve is tricuspid. Aortic valve regurgitation is not  visualized. Mild aortic valve sclerosis is present, with no evidence of  aortic valve stenosis.  6. The inferior vena cava is normal in size with greater than 50%  respiratory variability, suggesting right atrial pressure of 3 mmHg.   Assessment and Plan:  1.  CAD status post CABG in 2015.  LVEF 50 to 55% range.  He does not report any active angina on medical therapy, no nitroglycerin use.  ECG reviewed and stable.  Plan to continue medical therapy and observation.  If he does end up pursuing spine surgery under general anesthesia, we will likely need a preoperative Lexiscan Myoview for follow-up ischemic surveillance.  2.  History of postoperative atrial fibrillation following hip surgery in August of last year.  No obvious recurrences and now off Eliquis and amiodarone.  He is in sinus rhythm today.  If he shows recurrence with time, need to consider resumption of long-term anticoagulation with CHA2DS2-VASc score of 3.  3.  Mixed hyperlipidemia, on Pravachol.  Medication Adjustments/Labs and Tests Ordered: Current medicines are reviewed at length with the patient today.  Concerns regarding medicines  are outlined above.   Tests Ordered: No orders of the defined types were placed in this encounter.   Medication Changes: No orders of the defined types were placed in this encounter.   Disposition:  Follow up 6 months.  Signed, Satira Sark, MD, [facility] 01/16/2021 1:19 PM    [place] [facility] [address], [place], [place] [address] Phone: [phone]; Fax: [phone]

## 2021-01-16 NOTE — Addendum Note (Signed)
Addended by: Barbarann Ehlers A on: 01/16/2021 03:18 PM   Modules accepted: Orders

## 2021-01-21 DIAGNOSIS — M48062 Spinal stenosis, lumbar region with neurogenic claudication: Secondary | ICD-10-CM | POA: Diagnosis not present

## 2021-01-21 DIAGNOSIS — I10 Essential (primary) hypertension: Secondary | ICD-10-CM | POA: Diagnosis not present

## 2021-01-21 DIAGNOSIS — M4316 Spondylolisthesis, lumbar region: Secondary | ICD-10-CM | POA: Diagnosis not present

## 2021-01-21 DIAGNOSIS — Z6828 Body mass index (BMI) 28.0-28.9, adult: Secondary | ICD-10-CM | POA: Diagnosis not present

## 2021-01-21 DIAGNOSIS — R600 Localized edema: Secondary | ICD-10-CM | POA: Diagnosis not present

## 2021-01-21 DIAGNOSIS — M5416 Radiculopathy, lumbar region: Secondary | ICD-10-CM | POA: Diagnosis not present

## 2021-01-21 DIAGNOSIS — M4120 Other idiopathic scoliosis, site unspecified: Secondary | ICD-10-CM | POA: Diagnosis not present

## 2021-01-26 ENCOUNTER — Other Ambulatory Visit: Payer: Self-pay | Admitting: Diagnostic Neuroimaging

## 2021-01-26 ENCOUNTER — Other Ambulatory Visit (HOSPITAL_COMMUNITY): Payer: Self-pay | Admitting: Neurosurgery

## 2021-01-26 DIAGNOSIS — R600 Localized edema: Secondary | ICD-10-CM

## 2021-01-27 ENCOUNTER — Ambulatory Visit (HOSPITAL_COMMUNITY)
Admission: RE | Admit: 2021-01-27 | Discharge: 2021-01-27 | Disposition: A | Discharge status: Home / Self Care | Payer: Medicare Other | Source: Ambulatory Visit | Attending: Neurosurgery | Admitting: Neurosurgery

## 2021-01-27 ENCOUNTER — Other Ambulatory Visit: Payer: Self-pay

## 2021-01-27 DIAGNOSIS — M7989 Other specified soft tissue disorders: Secondary | ICD-10-CM | POA: Diagnosis not present

## 2021-01-27 DIAGNOSIS — M79662 Pain in left lower leg: Secondary | ICD-10-CM | POA: Diagnosis not present

## 2021-01-27 DIAGNOSIS — R600 Localized edema: Secondary | ICD-10-CM | POA: Diagnosis not present

## 2021-02-02 DIAGNOSIS — M4316 Spondylolisthesis, lumbar region: Secondary | ICD-10-CM | POA: Diagnosis not present

## 2021-02-02 DIAGNOSIS — M5416 Radiculopathy, lumbar region: Secondary | ICD-10-CM | POA: Diagnosis not present

## 2021-02-02 DIAGNOSIS — M533 Sacrococcygeal disorders, not elsewhere classified: Secondary | ICD-10-CM | POA: Diagnosis not present

## 2021-02-02 DIAGNOSIS — M5136 Other intervertebral disc degeneration, lumbar region: Secondary | ICD-10-CM | POA: Diagnosis not present

## 2021-02-02 DIAGNOSIS — M4120 Other idiopathic scoliosis, site unspecified: Secondary | ICD-10-CM | POA: Diagnosis not present

## 2021-02-02 DIAGNOSIS — R600 Localized edema: Secondary | ICD-10-CM | POA: Diagnosis not present

## 2021-02-04 ENCOUNTER — Other Ambulatory Visit: Payer: Self-pay | Admitting: Family Medicine

## 2021-02-18 ENCOUNTER — Ambulatory Visit (INDEPENDENT_AMBULATORY_CARE_PROVIDER_SITE_OTHER): Payer: Medicare Other | Admitting: Diagnostic Neuroimaging

## 2021-02-18 ENCOUNTER — Encounter: Payer: Self-pay | Admitting: Diagnostic Neuroimaging

## 2021-02-18 ENCOUNTER — Other Ambulatory Visit: Payer: Self-pay

## 2021-02-18 VITALS — BP 133/83 | HR 74 | Ht 73.0 in | Wt 223.0 lb

## 2021-02-18 DIAGNOSIS — I25119 Atherosclerotic heart disease of native coronary artery with unspecified angina pectoris: Secondary | ICD-10-CM | POA: Diagnosis not present

## 2021-02-18 DIAGNOSIS — G7000 Myasthenia gravis without (acute) exacerbation: Secondary | ICD-10-CM | POA: Diagnosis not present

## 2021-02-18 MED ORDER — PREDNISONE 5 MG PO TABS: ORAL_TABLET | ORAL | 3 refills | Status: AC

## 2021-02-18 NOTE — Patient Instructions (Addendum)
  GENERALIZED MYASTHENIA GRAVIS (stable)  - continue pyridostigmine 60mg  three times a day  - on prednisone 20mg  daily; will start tapering (15mg  daily x 1 month, then 10mg  daily x 1 month, then 5mg  daily x 1 month, then stop); if not tolerated, then may need to start imuran or cellcept  - continue vitamin D + calcium supplement

## 2021-02-18 NOTE — Progress Notes (Signed)
[facility]  [name] DOB: 12/07/1945  REFERRING CLINICIAN: Sharilyn Sites, MD HISTORY FROM: patient and wife  REASON FOR VISIT: follow up    HISTORICAL  CHIEF COMPLAINT:  Chief Complaint  Patient presents with   Follow-up    RM 6 with spouse Brandon [name]  Pt is well and doing better as far as stool and urinary issues goes.     HISTORY OF PRESENT ILLNESS:   UPDATE (02/18/21, VRP): Since last visit, doing well from MG symptoms. However prednisone side effects (weight gain, irritable) are getting worse. Lumbar spinal stenosis is worsening, and maybe heading towards surgery with Dr. Vertell Limber.  UPDATE (08/20/20, VRP): Since last visit, doing well with myasthenia gravis --> symptoms are stable. Also seeing Dr. Maureen Ralphs, Dr. Rolena Infante, Dr. Nelva Bush for lumbar spinal stenosis. Had more low back pain (starting Aug 2021), few months of loose stools, frequent urination. No saddle anesthesia.  UPDATE (03/18/20, VRP): Since last visit, doing well from MG. Some side effects from prednisone (jittery, bloating). Symptoms are stable. Severity is mild. No alleviating or aggravating factors. Tolerating meds otherwise.   UPDATE (01/08/20, VRP): Since last visit, doing WELL. Symptoms are stable. No alleviating or aggravating factors. Tolerating meds. New left hip stress fracture since Dec 28, 2019. Using walker.   UPDATE (11/14/19, VRP): Since last visit, doing WELL; s/p IVIG in the hospital. Now on prednisone 60mg  daily. Symptoms are improved. No alleviating or aggravating factors. Tolerating meds.    UPDATE (10/31/19, VRP): Since last visit, doing poorly. Now with fatigue, SOB, hand weakness, leg weakness. Mestinon not helping (even up to 90mg  three times a day).  PRIOR HPI (09/24/19): 75 year old male here for evaluation of myasthenia gravis.  September 10, 2019 patient noticed right eye ptosis, progressing to left eye ptosis.  He was having some stomach pain, diarrhea, gas, and breathing  issues around the same time.  [name], had [place] R antibody testing which was positive and diagnosed with myasthenia gravis.  Around the same time patient was with a GI doctor, diagnosed with possible bacterial infection and treated with antibiotics.  He was also noted to be heme positive stool.  Since that time I symptoms are stable.  Symptoms are worse in the evening compared the morning.  [name].  Denies any shortness of breath.  Denies any speech or swallowing problems.  Eye drooping is severe enough that sometimes it obstructs his vision and he is not able to drive currently.   REVIEW OF SYSTEMS: Full 14 system review of systems performed and negative with exception of: as per HPI.    ALLERGIES: No Known Allergies  HOME MEDICATIONS: Outpatient Medications Prior to Visit  Medication Sig Dispense Refill   acetaminophen (TYLENOL) 500 MG tablet Take 1,000 mg by mouth daily.     allopurinol (ZYLOPRIM) 300 MG tablet Take 150 mg by mouth daily.      amLODipine (NORVASC) 5 MG tablet Take 1 tablet (5 mg total) by mouth daily. 30 tablet 1   Ascorbic Acid (VITAMIN C) 1000 MG tablet Take 1,000 mg by mouth daily.     aspirin EC 81 MG tablet Take 1 tablet (81 mg total) by mouth daily. Swallow whole. 90 tablet 3   Calcium 600-200 MG-UNIT tablet Take 1 tablet by mouth daily.     HYDROcodone-acetaminophen (NORCO/VICODIN) 5-325 MG tablet Take 1-2 tablets by mouth every 6 (six) hours as needed for severe pain. 56 tablet 0  methocarbamol (ROBAXIN) 500 MG tablet Take 1 tablet (500 mg total) by mouth every 8 (eight) hours as needed for muscle spasms. 40 tablet 1   metoprolol tartrate (LOPRESSOR) 25 MG tablet Take 1/2 (one-half) tablet by mouth twice daily 90 tablet 3   Multiple Vitamin (MULTIVITAMIN) tablet Take 1 tablet by mouth daily.     nitroGLYCERIN (NITROSTAT) 0.4 MG SL tablet Place 1 tablet (0.4 mg total) under the tongue every 5 (five) minutes as  needed. 25 tablet 3   Omega-3 Fatty Acids (FISH OIL) 500 MG CAPS Take by mouth daily.     pravastatin (PRAVACHOL) 40 MG tablet Take 40 mg by mouth every evening.      predniSONE (DELTASONE) 10 MG tablet Take as directed; prednisone tapering (Patient taking differently: Take as directed; prednisone tapering 06/27/20 taking 20 mg qd) 150 tablet 12   pyridostigmine (MESTINON) 60 MG tablet TAKE 1 TABLET BY MOUTH THREE TIMES DAILY 90 tablet 0   tamsulosin (FLOMAX) 0.4 MG CAPS capsule Take 0.4 mg by mouth once.     UNABLE TO FIND Med Name: Osteo bioflex     pantoprazole (PROTONIX) 40 MG tablet Take 1 tablet (40 mg total) by mouth daily. 30 tablet 1   No facility-administered medications prior to visit.    PAST MEDICAL HISTORY: Past Medical History:  Diagnosis Date   Arthritis    Cataract    Coronary atherosclerosis of native coronary artery    Multivessel status post CABG March 2015   DDD (degenerative disc disease), lumbar    Dr. Carloyn Manner   Essential hypertension    GERD (gastroesophageal reflux disease)    Hard of hearing    Hemorrhoids    History of gout    History of kidney stones    History of [facility] spotted fever    Hypertensive heart disease without heart failure    Lumbago    Melena    Mixed hyperlipidemia    Ocular myasthenia (HCC)    Plantar fascial fibromatosis    Pleurodynia    Postoperative atrial fibrillation [facility])    August 2021    PAST SURGICAL HISTORY: Past Surgical History:  Procedure Laterality Date   AMPUTATION Right 03/27/2014   Procedure: DEBRIDEMENT AND CLOSURE RIGHT INDEX FINGER;  Surgeon: [name], MD;  Location: [place];  Service: Orthopedics;  Laterality: Right;   BACK SURGERY     BICEPT TENODESIS  11/26/2011   Procedure: BICEPT TENODESIS;  Surgeon: Augustin Schooling, MD;  Location: [place];  Service: Orthopedics;  Laterality: Left;   BIOPSY  08/03/2018   Procedure: BIOPSY;  Surgeon: Rogene Houston, MD;  [facility];  Service:  Endoscopy;;  antral   Cataract surgery Left    CHOLECYSTECTOMY N/A 09/22/2018   Procedure: LAPAROSCOPIC CHOLECYSTECTOMY;  Surgeon: Virl Cagey, MD;  Location: [facility];  Service: General;  Laterality: N/A;   COLONOSCOPY     COLONOSCOPY N/A 12/26/2014   Procedure: COLONOSCOPY;  Surgeon: Rogene Houston, MD;  [facility];  Service: Endoscopy;  Laterality: N/A;  730   CORONARY ARTERY BYPASS GRAFT N/A 10/19/2013   Procedure: CORONARY ARTERY BYPASS GRAFTING (CABG);  Surgeon: Rexene Alberts, MD;  Location: [place];  Service: Open Heart Surgery;  Laterality: N/A;  CABG times four using left internal mammary artery and left leg saphenous vein, incision made on right leg but no vein removed   CYSTOSCOPY WITH RETROGRADE PYELOGRAM, URETEROSCOPY AND STENT PLACEMENT Left 08/24/2016   Procedure: CYSTOSCOPY  WITH LEFT RETROGRADE PYELOGRAM, LEFT URETEROSCOPY, BASKET EXTRACTION LEFT URETERAL STONE;  Surgeon: Irine Seal, MD;  Location: [facility];  Service: Urology;  Laterality: Left;   ESOPHAGOGASTRODUODENOSCOPY N/A 08/03/2018   Procedure: ESOPHAGOGASTRODUODENOSCOPY (EGD);  Surgeon: Rogene Houston, MD;  [facility];  Service: Endoscopy;  Laterality: N/A;  2:55   EXTRACORPOREAL SHOCK WAVE LITHOTRIPSY Right 06/18/2019   Procedure: EXTRACORPOREAL SHOCK WAVE LITHOTRIPSY (ESWL);  Surgeon: Lucas Mallow, MD;  [facility];  Service: Urology;  Laterality: Right;   INTRAOPERATIVE TRANSESOPHAGEAL ECHOCARDIOGRAM N/A 10/19/2013   Procedure: INTRAOPERATIVE TRANSESOPHAGEAL ECHOCARDIOGRAM;  Surgeon: Rexene Alberts, MD;  Location: [place];  Service: Open Heart Surgery;  Laterality: N/A;   JOINT REPLACEMENT     LEFT HEART CATHETERIZATION WITH CORONARY ANGIOGRAM N/A 10/09/2013   Procedure: LEFT HEART CATHETERIZATION WITH CORONARY ANGIOGRAM;  Surgeon: Burnell Blanks, MD;  Location: [facility] CATH LAB;  Service: Cardiovascular;  Laterality: N/A;   Left shoulder rotator cuff repair     LITHOTRIPSY      ORIF PERIPROSTHETIC FRACTURE Left 03/19/2020   Procedure: OPEN REDUCTION INTERNAL FIXATION (ORIF) PERIPROSTHETIC FRACTURE LEFT FEMUR WITH FEMORAL REVISION;  Surgeon: Gaynelle Arabian, MD;  [facility];  Service: Orthopedics;  Laterality: Left;   SKIN CANCER EXCISION Left 01/2021   TOTAL HIP ARTHROPLASTY Left 01/28/2020   Procedure: TOTAL HIP ARTHROPLASTY ANTERIOR APPROACH;  Surgeon: Gaynelle Arabian, MD;  Location[facility];  Service: Orthopedics;  Laterality: Left;  187min   TOTAL KNEE ARTHROPLASTY Left    traumaticvamputation of finger  2015   VASECTOMY      FAMILY HISTORY: Family History  Problem Relation Age of Onset   Hypertension Sister    Lung cancer Father    Heart failure Mother     SOCIAL HISTORY: Social History   Socioeconomic History   Marital status: Married    Spouse name: Brandon [name]   Number of children: 2   Years of education: Not on file   Highest education level: 10th grade  Occupational History   Occupation: Associate Professor [employer]  Tobacco Use   Smoking status: Former    Packs/day: 2.00    Years: 30.00    Pack years: 60.00    Types: Cigarettes    Start date: 08/16/1958    Quit date: 08/17/1983    Years since quitting: 37.5   Smokeless tobacco: Former    Types: Chew   Tobacco comments:    quit smoking 45yrs ago  Vaping Use   Vaping Use: Never used  Substance and Sexual Activity   Alcohol use: Yes    Alcohol/week: 0.0 standard drinks    Comment: Occassional   Drug use: No   Sexual activity: Not on file  Other Topics Concern   Not on file  Social History Narrative   Lives with wife   Caffeine- coffee 3- 4 c daily   Social Determinants of Health   Financial Resource Strain: Not on file  Food Insecurity: Not on file  Transportation Needs: Not on file  Physical Activity: Not on file  Stress: Not on file  Social Connections: Not on file  Intimate Partner Violence: Not on file     PHYSICAL EXAM  GENERAL EXAM/CONSTITUTIONAL: Vitals:  Vitals:    02/18/21 1334  BP: 133/83  Pulse: 74  Weight: 223 lb (101.2 kg)  Height: 6\' 1"  (1.854 m)   Body mass index is 29.42 kg/m. Wt Readings from Last 10 Encounters:  02/18/21 223 lb (101.2 kg)  01/16/21 217 lb (98.4  kg)  11/11/20 219 lb 11.2 oz (99.7 kg)  08/20/20 214 lb (97.1 kg)  06/27/20 213 lb (96.6 kg)  04/03/20 211 lb 12.8 oz (96.1 kg)  03/19/20 202 lb 12.8 oz (92 kg)  03/18/20 202 lb 12.8 oz (92 kg)  01/28/20 206 lb 14.4 oz (93.9 kg)  01/25/20 206 lb 12.8 oz (93.8 kg)   Patient is in no distress; well developed, nourished and groomed; neck is supple MOON FACIES  CARDIOVASCULAR: Examination of carotid arteries is normal; no carotid bruits Regular rate and rhythm, no murmurs Examination of peripheral vascular system by observation and palpation is normal  EYES: Ophthalmoscopic exam of optic discs and posterior segments is normal; no papilledema or hemorrhages No results found.  MUSCULOSKELETAL: Gait, strength, tone, movements noted in Neurologic exam below  NEUROLOGIC: MENTAL STATUS:  No flowsheet data found. awake, alert, oriented to person, place and time recent and remote memory intact normal attention and concentration language fluent, comprehension intact, naming intact fund of knowledge appropriate  CRANIAL NERVE:  2nd - no papilledema on fundoscopic exam 2nd, 3rd, 4th, 6th - pupils equal and reactive to light, visual fields full to confrontation, extraocular muscles intact, no nystagmus 5th - facial sensation symmetric 7th - facial strength symmetric 8th - hearing --> REDUCED 9th - palate elevates symmetrically, uvula midline 11th - shoulder shrug symmetric 12th - tongue protrusion midline   MOTOR:  normal bulk and tone BUE 5; BLE --> RIGHT HF 4+, LEFT HF 3, LEFT DF 4  SENSORY:  normal and symmetric to light touch, temperature, vibration  COORDINATION:  finger-nose-finger, fine finger movements normal  REFLEXES:  deep tendon reflexes TRACE and  symmetric  GAIT/STATION:  narrow based gait; USING WALKER     DIAGNOSTIC DATA (LABS, IMAGING, TESTING) - I reviewed patient records, labs, notes, testing and imaging myself where available.  Lab Results  Component Value Date   WBC 9.7 03/22/2020   HGB 9.3 (L) 03/22/2020   HCT 29.7 (L) 03/22/2020   MCV 98.0 03/22/2020   PLT 151 03/22/2020      Component Value Date/Time   NA 137 03/19/2020 1218   NA 138 06/28/2019 0000   K 4.9 03/19/2020 1218   CL 102 03/19/2020 1218   CO2 22 03/19/2020 1218   GLUCOSE 137 (H) 03/19/2020 1218   BUN 22 03/19/2020 1218   BUN 20 06/28/2019 0000   CREATININE 1.17 03/19/2020 1218   CALCIUM 8.9 03/19/2020 1218   PROT 6.4 (L) 03/19/2020 1218   ALBUMIN 3.9 03/19/2020 1218   AST 45 (H) 03/19/2020 1218   ALT 34 03/19/2020 1218   ALKPHOS 80 03/19/2020 1218   BILITOT 0.8 03/19/2020 1218   GFRNONAA >60 03/19/2020 1218   GFRAA >60 03/19/2020 1218   Lab Results  Component Value Date   CHOL 134 06/28/2019   HDL 49 06/28/2019   LDLCALC 62 06/28/2019   TRIG 157 06/28/2019   CHOLHDL 2.4 12/06/2013   Lab Results  Component Value Date   HGBA1C 6.0 (H) 03/20/2020   No results found for: VITAMINB12 Lab Results  Component Value Date   TSH 0.981 03/21/2020    09/17/19 AchR ab - 42 HIGH  10/15/19 CT chest 1. No thymic mass or thymoma. 2.  Aortic Atherosclerosis (ICD10-I70.0). 3. 4.5 mm left lower lobe pulmonary nodule. This is unchanged compared with 04/12/2018. No follow-up needed if patient is low-risk. Non-contrast chest CT can be considered in 12 months if patient is high-risk. This recommendation follows the consensus statement: Guidelines for  Management of Incidental Pulmonary Nodules Detected on CT Images: From the Fleischner Society 2017; Radiology 2017; 416:384-536.  07/10/09 MRI lumbar spine 1.  Moderately severe spinal stenosis at L4-5 due to severe  bilateral facet joint arthritis and a 6 mm spondylolisthesis of L4  on L5.  2.   Diffuse bulging of the uncovered L4-5 disc with a herniation of  disc material into the right neural foramen compressing the right  L4 nerve root under the right pedicle.  3.  Severe right lateral recess stenosis at L4-5.  The right L5  nerve root appears compressed by the posterior element hypertrophy  in the right lateral recess.    ASSESSMENT AND PLAN  75 y.o. year old male here with new onset ptosis, blurred vision, generalized weakness with positive ACH antibody, consistent with generalized myasthenia gravis.    Dx:  1. Myasthenia gravis without (acute) exacerbation (HCC)       PLAN:  GENERALIZED MYASTHENIA GRAVIS (stable) - continue pyridostigmine 60mg  three times a day; may consider to reduce (30mg  three times a day) - completed IVIG x 5 days (in hospital; March 2021) - on prednisone 20mg  daily; will start tapering (15mg  daily x 1 month, then 10mg  daily x 1 month, then 5mg  daily x 1 month, then stop); if not tolerated, then may need to start imuran or cellcept - continue vitamin D + calcium supplement  LUMBAR SPINAL STENOSIS - follow up with Dr. Vertell Limber   Return in about 6 months (around 08/21/2021).    Penni Bombard, MD 11/20/8030, 1:22 PM Certified in Neurology, Neurophysiology and Neuroimaging  [facility] [address], [place], [place] [address] [phone]

## 2021-02-23 DIAGNOSIS — M533 Sacrococcygeal disorders, not elsewhere classified: Secondary | ICD-10-CM | POA: Diagnosis not present

## 2021-03-12 ENCOUNTER — Other Ambulatory Visit: Payer: Self-pay | Admitting: Diagnostic Neuroimaging

## 2021-03-20 ENCOUNTER — Other Ambulatory Visit: Payer: Self-pay

## 2021-03-20 ENCOUNTER — Ambulatory Visit (HOSPITAL_COMMUNITY)
Admission: RE | Admit: 2021-03-20 | Discharge: 2021-03-20 | Disposition: A | Discharge status: Home / Self Care | Payer: Medicare Other | Source: Ambulatory Visit | Attending: Neurosurgery | Admitting: Neurosurgery

## 2021-03-20 ENCOUNTER — Other Ambulatory Visit: Payer: Self-pay | Admitting: Neurosurgery

## 2021-03-20 DIAGNOSIS — M545 Low back pain, unspecified: Secondary | ICD-10-CM | POA: Diagnosis not present

## 2021-03-20 DIAGNOSIS — M5416 Radiculopathy, lumbar region: Secondary | ICD-10-CM | POA: Insufficient documentation

## 2021-03-20 MED ORDER — GADOBUTROL 1 MMOL/ML IV SOLN
10.0000 mL | Freq: Once | INTRAVENOUS | Status: AC | PRN
  Administered 2021-03-20: 10 mL via INTRAVENOUS

## 2021-03-24 DIAGNOSIS — S32010A Wedge compression fracture of first lumbar vertebra, initial encounter for closed fracture: Secondary | ICD-10-CM | POA: Diagnosis not present

## 2021-03-24 DIAGNOSIS — S32020A Wedge compression fracture of second lumbar vertebra, initial encounter for closed fracture: Secondary | ICD-10-CM | POA: Diagnosis not present

## 2021-03-24 DIAGNOSIS — M545 Low back pain, unspecified: Secondary | ICD-10-CM | POA: Diagnosis not present

## 2021-03-24 DIAGNOSIS — Z6828 Body mass index (BMI) 28.0-28.9, adult: Secondary | ICD-10-CM | POA: Diagnosis not present

## 2021-03-24 DIAGNOSIS — I10 Essential (primary) hypertension: Secondary | ICD-10-CM | POA: Diagnosis not present

## 2021-03-26 ENCOUNTER — Other Ambulatory Visit: Payer: Self-pay

## 2021-03-26 ENCOUNTER — Encounter (HOSPITAL_COMMUNITY): Payer: Self-pay | Admitting: Neurosurgery

## 2021-03-26 NOTE — Progress Notes (Signed)
Anesthesia Chart Review: Same-day work-up  Follows with cardiology for history of CAD s/p CABG 2015.  He also history of postop A. fib following hip surgery in August 2021.  Echo at that time showed EF of 50 to 55%, normal wall motion, no significant valvular abnormalities.  He has had no obvious recurrences of A. fib and his Eliquis and amiodarone were discontinued at office visit 01/16/2021.  At that office visit with Dr. Domenic Polite it was also discussed that he may be pursuing spine surgery for lumbar stenosis.  He commented that patient would likely need a preoperative Lexiscan Myoview for follow-up ischemic surveillance prior to surgery.    Follows with neurology for history of myasthenia gravis, diagnosed 2021, had new onset ptosis, blurred vision, generalized weakness with positive ACH antibody.  Received IVIG in hospital March 2021.  Last seen by Dr. [name] 02/18/2021.  Myasthenia gravis was noted to be stable at that time.  He is maintained on pyridostigmine 60 mg 3 times daily and prednisone.  At that appointment patient was taking 20 mg of prednisone daily and due to side effects was recommended to begin tapering slowly.  Per pharmacy med rec, patient currently taking 10 mg daily.  Patient has recently suffered lumbar compression fractures and is scheduled for kyphoplasty at L1 and L2.  Reviewed patient history with anesthesiologist Dr. Fransisco Beau.  He advised that if the patient is asymptomatic from a cardiovascular standpoint he can proceed with kyphoplasty as scheduled.  He will be evaluated by anesthesiologist on day of surgery.  EKG 01/16/2021: NSR.  Rate 68.  Minimal voltage criteria for LVH, may be normal variant.  Inferior infarct, age undetermined.  TTE 03/20/2020: 1. Left ventricular ejection fraction, by estimation, is 50 to 55%. The  left ventricle has low normal function. The left ventricle has no regional  wall motion abnormalities. There is mild left ventricular hypertrophy of  the  basal-septal segment. Left  ventricular diastolic function could not be evaluated.   2. Right ventricular systolic function is normal. The right ventricular  size is normal. Tricuspid regurgitation signal is inadequate for assessing  PA pressure.   3. Left atrial size was severely dilated.   4. The mitral valve is normal in structure. Trivial mitral valve  regurgitation. No evidence of mitral stenosis.   5. The aortic valve is tricuspid. Aortic valve regurgitation is not  visualized. Mild aortic valve sclerosis is present, with no evidence of  aortic valve stenosis.   6. The inferior vena cava is normal in size with greater than 50%  respiratory variability, suggesting right atrial pressure of 3 mmHg.     Wynonia Musty [facility] Short Stay Center/Anesthesiology Phone [phone] 03/26/2021 3:02 PM

## 2021-03-26 NOTE — Progress Notes (Signed)
Mr. Andrada saw Dr. Domenic Polite on 01/16/21 for followup.  This is copied from Dr McDowell's notes on 01/16/21: "If he does end up pursuing spine surgery under general anesthesia, we will likely need a preoperative Lexiscan Myoview for follow-up ischemic surveillance."  I called Dr. Melven Sartorius office and left [place] a voice message with above information, I asked if Dr. Domenic Polite had been contacted.

## 2021-03-26 NOTE — Progress Notes (Addendum)
Brandon [name] denies chest pain or shortness of breath. Patient denies having any s/s of Covid in his household.  Patient denies any known exposure to Covid.  Mr. Hilborn is [facility] Mrs. Severs wrote the instructions down instructions.  Cardiologist is Dr. Domenic Polite  Patient sees Dr [name], neurologist for Myasthenia Gravis.

## 2021-03-26 NOTE — Anesthesia Preprocedure Evaluation (Addendum)
Anesthesia Evaluation  Patient identified by MRN, date of birth, ID band Patient awake    Reviewed: Allergy & Precautions, NPO status , Patient's Chart, lab work & pertinent test results  History of Anesthesia Complications Negative for: history of anesthetic complications  Airway Mallampati: II  TM Distance: >3 FB Neck ROM: Full    Dental  (+) Missing,    Pulmonary former smoker,    Pulmonary exam normal        Cardiovascular hypertension, Pt. on medications + CAD and + CABG (2015)  Normal cardiovascular examDysrhythmias: h/o paroxysmal afib (postop) Atrial Fibrillation   EKG 01/16/2021: NSR, inferior infarct, age undetermined.  TTE 03/20/2020: EF 50-55%, mild basal-septal LVH, severe LAE, valves ok   Neuro/Psych Myasthenia gravis negative psych ROS   GI/Hepatic Neg liver ROS, GERD  Controlled,  Endo/Other  negative endocrine ROShyperlipidemia  Renal/GU Renal disease: kidney stones.negative Renal ROS  negative genitourinary   Musculoskeletal  (+) Arthritis , Osteoarthritis,    Abdominal   Peds negative pediatric ROS (+)  Hematology negative hematology ROS (+)   Anesthesia Other Findings Day of surgery medications reviewed with patient.  Reproductive/Obstetrics negative OB ROS                         Anesthesia Physical Anesthesia Plan  ASA: 3  Anesthesia Plan: General   Post-op Pain Management:    Induction: Intravenous  PONV Risk Score and Plan: 2 and Treatment may vary due to age or medical condition, Ondansetron and Dexamethasone  Airway Management Planned: Oral ETT  Additional Equipment: None  Intra-op Plan:   Post-operative Plan: Extubation in OR  Informed Consent: I have reviewed the patients History and Physical, chart, labs and discussed the procedure including the risks, benefits and alternatives for the proposed anesthesia with the patient or authorized  representative who has indicated his/her understanding and acceptance.     Dental advisory given  Plan Discussed with: CRNA  Anesthesia Plan Comments: (See PAT note by Karoline Caldwell, PA-C )     Anesthesia Quick Evaluation

## 2021-03-27 ENCOUNTER — Ambulatory Visit (HOSPITAL_COMMUNITY): Payer: Medicare Other

## 2021-03-27 ENCOUNTER — Ambulatory Visit (HOSPITAL_COMMUNITY): Payer: Medicare Other | Admitting: Physician Assistant

## 2021-03-27 ENCOUNTER — Encounter (HOSPITAL_COMMUNITY)
Admission: RE | Disposition: A | Discharge status: Home / Self Care | Payer: Self-pay | Source: Home / Self Care | Attending: Neurosurgery

## 2021-03-27 ENCOUNTER — Encounter (HOSPITAL_COMMUNITY): Payer: Self-pay | Admitting: Neurosurgery

## 2021-03-27 ENCOUNTER — Observation Stay (HOSPITAL_COMMUNITY)
Admission: RE | Admit: 2021-03-27 | Discharge: 2021-03-27 | Disposition: A | Discharge status: Home / Self Care | Payer: Medicare Other | Attending: Neurosurgery | Admitting: Neurosurgery

## 2021-03-27 DIAGNOSIS — I10 Essential (primary) hypertension: Secondary | ICD-10-CM | POA: Diagnosis not present

## 2021-03-27 DIAGNOSIS — Z95810 Presence of automatic (implantable) cardiac defibrillator: Secondary | ICD-10-CM | POA: Diagnosis not present

## 2021-03-27 DIAGNOSIS — T380X5A Adverse effect of glucocorticoids and synthetic analogues, initial encounter: Secondary | ICD-10-CM | POA: Diagnosis not present

## 2021-03-27 DIAGNOSIS — X58XXXA Exposure to other specified factors, initial encounter: Secondary | ICD-10-CM | POA: Insufficient documentation

## 2021-03-27 DIAGNOSIS — M8088XA Other osteoporosis with current pathological fracture, vertebra(e), initial encounter for fracture: Principal | ICD-10-CM | POA: Insufficient documentation

## 2021-03-27 DIAGNOSIS — E782 Mixed hyperlipidemia: Secondary | ICD-10-CM | POA: Diagnosis not present

## 2021-03-27 DIAGNOSIS — S32020A Wedge compression fracture of second lumbar vertebra, initial encounter for closed fracture: Secondary | ICD-10-CM | POA: Diagnosis not present

## 2021-03-27 DIAGNOSIS — Z7982 Long term (current) use of aspirin: Secondary | ICD-10-CM | POA: Diagnosis not present

## 2021-03-27 DIAGNOSIS — S32000A Wedge compression fracture of unspecified lumbar vertebra, initial encounter for closed fracture: Secondary | ICD-10-CM | POA: Diagnosis present

## 2021-03-27 DIAGNOSIS — I119 Hypertensive heart disease without heart failure: Secondary | ICD-10-CM | POA: Diagnosis not present

## 2021-03-27 DIAGNOSIS — I2510 Atherosclerotic heart disease of native coronary artery without angina pectoris: Secondary | ICD-10-CM | POA: Diagnosis not present

## 2021-03-27 DIAGNOSIS — Z79899 Other long term (current) drug therapy: Secondary | ICD-10-CM | POA: Diagnosis not present

## 2021-03-27 DIAGNOSIS — Z20822 Contact with and (suspected) exposure to covid-19: Secondary | ICD-10-CM | POA: Insufficient documentation

## 2021-03-27 DIAGNOSIS — S32010A Wedge compression fracture of first lumbar vertebra, initial encounter for closed fracture: Secondary | ICD-10-CM | POA: Insufficient documentation

## 2021-03-27 DIAGNOSIS — Z419 Encounter for procedure for purposes other than remedying health state, unspecified: Secondary | ICD-10-CM

## 2021-03-27 DIAGNOSIS — M4856XA Collapsed vertebra, not elsewhere classified, lumbar region, initial encounter for fracture: Secondary | ICD-10-CM | POA: Diagnosis not present

## 2021-03-27 DIAGNOSIS — Z4889 Encounter for other specified surgical aftercare: Secondary | ICD-10-CM | POA: Diagnosis not present

## 2021-03-27 HISTORY — DX: Malignant (primary) neoplasm, unspecified: C80.1

## 2021-03-27 HISTORY — PX: KYPHOPLASTY: SHX5884

## 2021-03-27 LAB — BASIC METABOLIC PANEL
Anion gap: 11 (ref 5–15)
BUN: 13 mg/dL (ref 8–23)
CO2: 23 mmol/L (ref 22–32)
Calcium: 9.1 mg/dL (ref 8.9–10.3)
Chloride: 102 mmol/L (ref 98–111)
Creatinine, Ser: 1.02 mg/dL (ref 0.61–1.24)
GFR, Estimated: >60 mL/min (ref >60)
Glucose, Bld: 123 mg/dL — ABNORMAL HIGH (ref 70–99)
Potassium: 3.8 mmol/L (ref 3.5–5.1)
Sodium: 136 mmol/L (ref 135–145)

## 2021-03-27 LAB — SARS CORONAVIRUS 2 BY RT PCR (HOSPITAL ORDER, PERFORMED IN ~~LOC~~ HOSPITAL LAB): SARS Coronavirus 2: NEGATIVE

## 2021-03-27 LAB — CBC
HCT: 41.9 % (ref 39.0–52.0)
Hemoglobin: 13.5 g/dL (ref 13.0–17.0)
MCH: 29.8 pg (ref 26.0–34.0)
MCHC: 32.2 g/dL (ref 30.0–36.0)
MCV: 92.5 fL (ref 80.0–100.0)
Platelets: 322 10*3/uL (ref 150–400)
RBC: 4.53 MIL/uL (ref 4.22–5.81)
RDW: 14.4 % (ref 11.5–15.5)
WBC: 12.7 10*3/uL — ABNORMAL HIGH (ref 4.0–10.5)
nRBC: 0 % (ref 0.0–0.2)

## 2021-03-27 LAB — SURGICAL PCR SCREEN
MRSA, PCR: POSITIVE — AB
Staphylococcus aureus: POSITIVE — AB

## 2021-03-27 SURGERY — KYPHOPLASTY
Anesthesia: General | Site: Spine Lumbar

## 2021-03-27 MED ORDER — ROCURONIUM BROMIDE 10 MG/ML (PF) SYRINGE
PREFILLED_SYRINGE | INTRAVENOUS | Status: DC | PRN
  Administered 2021-03-27: 60 mg via INTRAVENOUS

## 2021-03-27 MED ORDER — PROPOFOL 10 MG/ML IV BOLUS
INTRAVENOUS | Status: AC
  Filled 2021-03-27: qty 20

## 2021-03-27 MED ORDER — FENTANYL CITRATE (PF) 100 MCG/2ML IJ SOLN: 25.0000 ug | INTRAMUSCULAR | Status: DC | PRN

## 2021-03-27 MED ORDER — FENTANYL CITRATE (PF) 250 MCG/5ML IJ SOLN
INTRAMUSCULAR | Status: AC
  Filled 2021-03-27: qty 5

## 2021-03-27 MED ORDER — ONDANSETRON HCL 4 MG/2ML IJ SOLN
INTRAMUSCULAR | Status: DC | PRN
  Administered 2021-03-27: 4 mg via INTRAVENOUS

## 2021-03-27 MED ORDER — LIDOCAINE 2% (20 MG/ML) 5 ML SYRINGE
INTRAMUSCULAR | Status: AC
  Filled 2021-03-27: qty 5

## 2021-03-27 MED ORDER — PROPOFOL 10 MG/ML IV BOLUS
INTRAVENOUS | Status: DC | PRN
  Administered 2021-03-27: 150 mg via INTRAVENOUS

## 2021-03-27 MED ORDER — OXYCODONE HCL 5 MG PO TABS
5.0000 mg | ORAL_TABLET | Freq: Once | ORAL | Status: DC | PRN
Start: 2021-03-27 — End: 2021-03-27

## 2021-03-27 MED ORDER — ORAL CARE MOUTH RINSE: 15.0000 mL | Freq: Once | OROMUCOSAL | Status: AC

## 2021-03-27 MED ORDER — DEXAMETHASONE SODIUM PHOSPHATE 10 MG/ML IJ SOLN
INTRAMUSCULAR | Status: DC | PRN
  Administered 2021-03-27: 5 mg via INTRAVENOUS

## 2021-03-27 MED ORDER — LIDOCAINE-EPINEPHRINE 1 %-1:100000 IJ SOLN
INTRAMUSCULAR | Status: AC
  Filled 2021-03-27: qty 1

## 2021-03-27 MED ORDER — PHENYLEPHRINE 40 MCG/ML (10ML) SYRINGE FOR IV PUSH (FOR BLOOD PRESSURE SUPPORT)
PREFILLED_SYRINGE | INTRAVENOUS | Status: DC | PRN
  Administered 2021-03-27 (×3): 80 ug via INTRAVENOUS

## 2021-03-27 MED ORDER — VANCOMYCIN HCL IN DEXTROSE 1-5 GM/200ML-% IV SOLN
INTRAVENOUS | Status: AC
  Filled 2021-03-27: qty 200

## 2021-03-27 MED ORDER — LACTATED RINGERS IV SOLN: INTRAVENOUS | Status: DC

## 2021-03-27 MED ORDER — ACETAMINOPHEN 500 MG PO TABS
1000.0000 mg | ORAL_TABLET | Freq: Once | ORAL | Status: AC
  Administered 2021-03-27: 1000 mg via ORAL
  Filled 2021-03-27: qty 2

## 2021-03-27 MED ORDER — CHLORHEXIDINE GLUCONATE 0.12 % MT SOLN
15.0000 mL | Freq: Once | OROMUCOSAL | Status: AC
  Administered 2021-03-27: 15 mL via OROMUCOSAL
  Filled 2021-03-27: qty 15

## 2021-03-27 MED ORDER — AMISULPRIDE (ANTIEMETIC) 5 MG/2ML IV SOLN: 10.0000 mg | Freq: Once | INTRAVENOUS | Status: DC | PRN

## 2021-03-27 MED ORDER — ONDANSETRON HCL 4 MG/2ML IJ SOLN: 4.0000 mg | Freq: Once | INTRAMUSCULAR | Status: DC | PRN

## 2021-03-27 MED ORDER — ROCURONIUM BROMIDE 10 MG/ML (PF) SYRINGE
PREFILLED_SYRINGE | INTRAVENOUS | Status: AC
  Filled 2021-03-27: qty 10

## 2021-03-27 MED ORDER — OXYCODONE HCL 5 MG/5ML PO SOLN: 5.0000 mg | Freq: Once | ORAL | Status: DC | PRN

## 2021-03-27 MED ORDER — PHENYLEPHRINE 40 MCG/ML (10ML) SYRINGE FOR IV PUSH (FOR BLOOD PRESSURE SUPPORT)
PREFILLED_SYRINGE | INTRAVENOUS | Status: AC
  Filled 2021-03-27: qty 10

## 2021-03-27 MED ORDER — SUGAMMADEX SODIUM 200 MG/2ML IV SOLN
INTRAVENOUS | Status: DC | PRN
Start: 2021-03-27 — End: 2021-03-27
  Administered 2021-03-27: 200 mg via INTRAVENOUS

## 2021-03-27 MED ORDER — IOPAMIDOL (ISOVUE-300) INJECTION 61%
INTRAVENOUS | Status: DC | PRN
  Administered 2021-03-27: 50 mL

## 2021-03-27 MED ORDER — LIDOCAINE 2% (20 MG/ML) 5 ML SYRINGE
INTRAMUSCULAR | Status: DC | PRN
  Administered 2021-03-27: 100 mg via INTRAVENOUS

## 2021-03-27 MED ORDER — LIDOCAINE-EPINEPHRINE 1 %-1:100000 IJ SOLN
INTRAMUSCULAR | Status: DC | PRN
  Administered 2021-03-27: 5 mL

## 2021-03-27 MED ORDER — ONDANSETRON HCL 4 MG/2ML IJ SOLN
INTRAMUSCULAR | Status: AC
  Filled 2021-03-27: qty 2

## 2021-03-27 MED ORDER — DEXAMETHASONE SODIUM PHOSPHATE 10 MG/ML IJ SOLN
INTRAMUSCULAR | Status: AC
  Filled 2021-03-27: qty 1

## 2021-03-27 MED ORDER — BUPIVACAINE HCL (PF) 0.5 % IJ SOLN
INTRAMUSCULAR | Status: AC
  Filled 2021-03-27: qty 30

## 2021-03-27 MED ORDER — OXYCODONE HCL 5 MG PO TABS: 5.0000 mg | ORAL_TABLET | Freq: Once | ORAL | 0 refills | Status: DC | PRN

## 2021-03-27 MED ORDER — 0.9 % SODIUM CHLORIDE (POUR BTL) OPTIME
TOPICAL | Status: DC | PRN
  Administered 2021-03-27: 1000 mL

## 2021-03-27 MED ORDER — VANCOMYCIN HCL IN DEXTROSE 1-5 GM/200ML-% IV SOLN
1000.0000 mg | Freq: Once | INTRAVENOUS | Status: AC
  Administered 2021-03-27: 1000 mg via INTRAVENOUS

## 2021-03-27 MED ORDER — FENTANYL CITRATE (PF) 100 MCG/2ML IJ SOLN
INTRAMUSCULAR | Status: DC | PRN
  Administered 2021-03-27: 100 ug via INTRAVENOUS

## 2021-03-27 MED ORDER — BUPIVACAINE HCL (PF) 0.5 % IJ SOLN
INTRAMUSCULAR | Status: DC | PRN
  Administered 2021-03-27: 5 mL

## 2021-03-27 SURGICAL SUPPLY — 43 items
ADH SKN CLS APL DERMABOND .7 (GAUZE/BANDAGES/DRESSINGS) ×1
BAG COUNTER SPONGE SURGICOUNT (BAG) ×2 IMPLANT
BAG SPNG CNTER NS LX DISP (BAG) ×1
BLADE CLIPPER SURG (BLADE) ×1 IMPLANT
BLADE SURG 15 STRL LF DISP TIS (BLADE) ×1 IMPLANT
BLADE SURG 15 STRL SS (BLADE) ×2
CARTRIDGE OIL MAESTRO DRILL (MISCELLANEOUS) ×1 IMPLANT
CEMENT KYPHON CX01A KIT/MIXER (Cement) ×1 IMPLANT
DERMABOND ADVANCED (GAUZE/BANDAGES/DRESSINGS) ×1
DERMABOND ADVANCED .7 DNX12 (GAUZE/BANDAGES/DRESSINGS) ×1 IMPLANT
DIFFUSER DRILL AIR PNEUMATIC (MISCELLANEOUS) ×2 IMPLANT
DRAPE C-ARM 42X72 X-RAY (DRAPES) ×2 IMPLANT
DRAPE HALF SHEET 40X57 (DRAPES) ×2 IMPLANT
DRAPE LAPAROTOMY 100X72X124 (DRAPES) ×2 IMPLANT
DRAPE SURG 17X23 STRL (DRAPES) ×2 IMPLANT
DRAPE WARM FLUID 44X44 (DRAPES) ×2 IMPLANT
DRSG OPSITE POSTOP 3X4 (GAUZE/BANDAGES/DRESSINGS) ×1 IMPLANT
DURAPREP 26ML APPLICATOR (WOUND CARE) ×2 IMPLANT
GAUZE 4X4 16PLY ~~LOC~~+RFID DBL (SPONGE) ×2 IMPLANT
GLOVE EXAM NITRILE XL STR (GLOVE) IMPLANT
GLOVE SURG ENC MOIS LTX SZ8 (GLOVE) ×2 IMPLANT
GLOVE SURG UNDER POLY LF SZ8.5 (GLOVE) ×2 IMPLANT
GOWN STRL REUS W/ TWL LRG LVL3 (GOWN DISPOSABLE) IMPLANT
GOWN STRL REUS W/ TWL XL LVL3 (GOWN DISPOSABLE) IMPLANT
GOWN STRL REUS W/TWL 2XL LVL3 (GOWN DISPOSABLE) IMPLANT
GOWN STRL REUS W/TWL LRG LVL3 (GOWN DISPOSABLE)
GOWN STRL REUS W/TWL XL LVL3 (GOWN DISPOSABLE)
KIT BASIN OR (CUSTOM PROCEDURE TRAY) ×2 IMPLANT
KIT TURNOVER KIT B (KITS) ×2 IMPLANT
NDL HYPO 25X1 1.5 SAFETY (NEEDLE) ×1 IMPLANT
NEEDLE HYPO 25X1 1.5 SAFETY (NEEDLE) ×2 IMPLANT
NS IRRIG 1000ML POUR BTL (IV SOLUTION) ×2 IMPLANT
OIL CARTRIDGE MAESTRO DRILL (MISCELLANEOUS) ×2
PACK SURGICAL SETUP 50X90 (CUSTOM PROCEDURE TRAY) ×2 IMPLANT
PAD ARMBOARD 7.5X6 YLW CONV (MISCELLANEOUS) ×6 IMPLANT
STAPLER SKIN PROX WIDE 3.9 (STAPLE) ×2 IMPLANT
SUT VIC AB 3-0 SH 8-18 (SUTURE) ×2 IMPLANT
SYR CONTROL 10ML LL (SYRINGE) ×4 IMPLANT
TOWEL GREEN STERILE (TOWEL DISPOSABLE) ×2 IMPLANT
TOWEL GREEN STERILE FF (TOWEL DISPOSABLE) ×2 IMPLANT
TRAY KYPHOPAK 15/2 EXPRESS (KITS) IMPLANT
TRAY KYPHOPAK 15/2 EXPRESS CDS (KITS) ×1 IMPLANT
TRAY KYPHOPAK 20/3 ONESTEP 1ST (MISCELLANEOUS) IMPLANT

## 2021-03-27 NOTE — H&P (Signed)
Patient ID:   [id number] [name]  Date of Birth: 06-Mar-1946 Visit Type: Office Visit   Date: 03/24/2021 03:00 PM Provider: Marchia Meiers. Vertell Limber MD   This 75 year old male presents for back pain.  HISTORY OF PRESENT ILLNESS: 1.  back pain  Patient returns for review of lumbar MRI.  02/23/2021 bilateral SI joint injection by Dr. Maryjean Ka offered no significant relief 12/25/2020 left L2-3 interlaminar ESI by Dr. Davy Pique offered no relief  Patient called August 3rd reporting increased pain low back with new onset bladder and bowel incontinence attributed to severe pain exacerbation.  Urgent MRI was ordered.  Hydrocodone was changed to Percocet 5/325.  Patient currently reports low back pain bilateral buttock pain with occasional pain into either leg.  Percocet 5/325 taken 1-3 per day p.r.n., offers little relief. Prednisone continues to taper for myasthenia gravis.  The patient's new MRI shows a 65% height loss compression fracture of L1 with a 10% superior endplate of L2 compression fracture.  In addition there is a left-sided disc herniation and spinal stenosis at the L2-3 level.  He has had prior lumbar fusion L3 through L5 levels.  Dr. Carloyn Manner performed spinal fusion surgery in December of 2016.  At this point the patient is complaining of low back pain across both of his buttocks and minimally of leg pain.  He does not feel that his left leg is bothering him more than his right.  He says his most severe pain is from his low back.  His strength is full on confrontational testing of both lower extremities.  The patient describes that he cannot stand up without severe pain and when he does so his pain is up to 10/10 in severity.      Medical/Surgical/Interim History Reviewed, no change.  Last detailed document date:10/27/2020.     PAST MEDICAL HISTORY, SURGICAL HISTORY, FAMILY HISTORY, SOCIAL HISTORY AND REVIEW OF SYSTEMS I have reviewed the patient's past medical,  surgical, family and social history as well as the comprehensive review of systems as included on the [facility] history form dated 11/03/2020, which I have signed.  Family History: Reviewed, no changes.  Last detailed document date:10/27/2020.   Social History: Reviewed, no changes. Last detailed document date: 10/27/2020.    MEDICATIONS: (added, continued or stopped this visit) Started Medication Directions Instruction Stopped  allopurinol 300 mg tablet take 1/2 tablet by oral route  every day    amlodipine 5 mg tablet take 1 tablet by oral route  every day    Aspirin Low Dose 81 mg tablet,delayed release take 1 tablet by oral route  every day    Calcium 600 + Minerals 600 mg calcium-200 unit tablet take 2 tablets by oral route every day    Fish Oil take 1 capsule ('500mg'$ ) by oral route every day   02/27/2021 hydrocodone 5 mg-acetaminophen 325 mg tablet take 1 tablet by oral route  every 6 hours as needed for pain    methocarbamol 500 mg tablet     metoprolol tartrate 25 mg tablet take 1/2 tablet by oral route 2 times every day    multivitamin take 1 tablet by oral route  every day    nitroglycerin 0.4 mg sublingual tablet     Osteo Bi-Flex 250 mg-200 mg tablet take 1 tablet by oral route every day   03/18/2021 oxycodone-acetaminophen 5 mg-325 mg tablet take 1 tablet by oral route  every 6 hours as needed    pantoprazole 40  mg tablet,delayed release take 1 tablet by oral route  every day    pravastatin 40 mg tablet take 1 tablet by oral route  every day in the evening    prednisone 10 mg tablet take 1 tablet by oral route  every day    prednisone 20 mg tablet   03/24/2021  pyridostigmine bromide 60 mg tablet take 1/2 tablet by oral route 3 times every day    tamsulosin 0.4 mg capsule take 1 capsule by oral route  every day 1/2 hour following the same meal each day    Vitamin C 1,000  mg tablet take 1 tablet by oral route every day      ALLERGIES: Ingredient Reaction Medication Name Comment NO KNOWN ALLERGIES    No known allergies. Reviewed, no changes.    PHYSICAL EXAM:  Vitals Date Temp F BP Pulse Ht In Wt Lb BMI BSA Pain Score 03/24/2021  131/85 86 73 213.2 28.13  7/10     IMPRESSION:  The patient has 2 new lumbar compression fractures.  These are at L1 and L2 levels.  He has degeneration and spinal stenosis with a disc herniation at the L2-3 level on the left but I do not believe that this is a cause of his symptoms and significant pain.  PLAN: L1 and L2 kyphoplasty procedure.  This will be done on an expedited basis because of the severity of the patient's pain.  Orders: Diagnostic Procedures: Assessment Procedure M54.50 Thoraco-lumbar Spine- AP/Lat Instruction(s)/Education: Assessment Instruction I10 Lifestyle education [phone] Dietary management education, guidance, and counseling  Completed Orders (this encounter) Order Details Reason Side Interpretation Result Initial Treatment Date Region Lifestyle education Patient will follow up with Primary Care Physician.       Dietary management education, guidance, and counseling Encouraged patient to eat well balanced diet.        Assessment/Plan  # Detail Type Description  1. Assessment Compression fracture of L1 vertebra, initial encounter (S32.010A).     2. Assessment Compression fracture of L2 vertebra, initial encounter (S32.020A).     3. Assessment Lumbar back pain (M54.50).     4. Assessment Essential (primary) hypertension (I10).     5. Assessment Body mass index (BMI) 28.0-28.9, adult [id number]).  Plan Orders Today's instructions / counseling include(s) Dietary management education, guidance, and counseling. Clinical information/comments: Encouraged patient to eat well balanced diet.       Pain Management  Plan Pain Scale: 7/10. Method: Numeric Pain Intensity Scale. Location: back. Onset: 08/31/2009. Duration: varies. Quality: discomforting. Pain management follow-up plan of care: Patient will continue medication management..              Provider:  Marchia Meiers. Vertell Limber MD  03/24/2021 04:03 PM    Dictation edited by: Marchia Meiers. Vertell Limber    CC Providers: Sharilyn Sites [place] [facility] [address]  [place]  [address]-   Edward Hawkins  [address] [place], [place] [address]-               Electronically signed by Marchia Meiers Vertell Limber MD on 03/24/2021 04:03 PM

## 2021-03-27 NOTE — Interval H&P Note (Signed)
History and Physical Interval Note:  03/27/2021 10:13 AM  Brandon [name]  has presented today for surgery, with the diagnosis of Compression fracture.  The various methods of treatment have been discussed with the patient and family. After consideration of risks, benefits and other options for treatment, the patient has consented to  Procedure(s) with comments: Kyphoplasty - L1 - L2 (N/A) - 3C as a surgical intervention.  The patient's history has been reviewed, patient examined, no change in status, stable for surgery.  I have reviewed the patient's chart and labs.  Questions were answered to the patient's satisfaction.     Peggyann Shoals

## 2021-03-27 NOTE — Brief Op Note (Signed)
03/27/2021  1:26 PM  PATIENT:  Brandon [name]  75 y.o. male  PRE-OPERATIVE DIAGNOSIS:  Compression fracture of L 1 and L 2  POST-OPERATIVE DIAGNOSIS:  Compression fracture of L 1 and L 2  PROCEDURE:  Procedure(s): Lumbar One and Lumbar Two Kyphoplasty (N/A)  SURGEON:  Surgeon(s) and Role:    Erline Levine, MD - Primary  PHYSICIAN ASSISTANT:   ASSISTANTS: none   ANESTHESIA:   local and general  EBL:  5 mL   BLOOD ADMINISTERED:none  DRAINS: none   LOCAL MEDICATIONS USED:  MARCAINE    and LIDOCAINE   SPECIMEN:  No Specimen  DISPOSITION OF SPECIMEN:  N/A  COUNTS:  YES  TOURNIQUET:  * No tourniquets in log *  DICTATION: Patient is 75 year old man with severe back pain, who has now developed a painful L 1 and  L 2 compression fractures, which is proving debilitatingly painful to him.  It was elected to take her to surgery for kyphoplasty procedure.  PROCEDURE:  Following the smooth and uncomplicated induction of general endotracheal anesthesia, the patient was placed in a prone position on chest rolls.  C-arm fluoroscopy was positioned in both the AP and lateral planes, centered on the L 1 and L 2 vertebrae.  His back was prepped and draped in the usual sterile fashion with Duraprep.  Using a right uni-pedicular approach, the L2 pedicle and L 1 pedicles were entered with the trochar using standard landmarks and trochar was advanced into the vertebral bodies.  The drill was used, followed by a 15 cc Kyphon balloon at each level, which was used to re-expand the broken vertebra.  Subsequently, approximately 6  cc of bone cement was placed into the void created by the balloon and was seen to fill each broken vertebra in both the AP and lateral direction with good interdigitation and without apparent extravasation at L 2.  At L 1, this vertebra was surprisingly sclerotic and the balloon inflated less readily.  After partial filling with small amount of extravasation into the T 12 L 1  disc space, I repositioned the trochar and filled more inferiorly in this vertebra.  I ceased fill with small amount of extravasation into the L 12 disc space. There was a total of 4 cc of bone cement used at this level . The bone void fillers were then removed.  Final X-ray demonstrated good filling within the fractured vertebrae.  The incisions were closed with a single 3-0 vicryl stitch at each site and dressed with Dermabond and an occlusive dressing. The patient was returned to the [facility] and extubated in the OR and taken to Recovery in stable and satisfactory condition, having tolerated the procedure well.  Counts were correct at the end of the case.   PLAN OF CARE: Admit for overnight observation  PATIENT DISPOSITION:  PACU - hemodynamically stable.   Delay start of Pharmacological VTE agent (>24hrs) due to surgical blood loss or risk of bleeding: yes

## 2021-03-27 NOTE — Transfer of Care (Signed)
Immediate Anesthesia Transfer of Care Note  [name]  Procedure(s) Performed: Lumbar One and Lumbar Two Kyphoplasty (Spine Lumbar)  Patient Location: PACU  Anesthesia Type:General  Level of Consciousness: awake, alert  and oriented  Airway & Oxygen Therapy: Patient Spontanous Breathing  Post-op Assessment: Report given to RN  Post vital signs: Reviewed and stable  Last Vitals:  Vitals Value Taken Time  BP 150/82 03/27/21 1325  Temp    Pulse 69 03/27/21 1325  Resp 9 03/27/21 1325  SpO2 97 % 03/27/21 1325  Vitals shown include unvalidated device data.  Last Pain:  Vitals:   03/27/21 1000  TempSrc:   PainSc: 3       Patients Stated Pain Goal: 3 ([id number])  Complications: No notable events documented.

## 2021-03-27 NOTE — Op Note (Signed)
03/27/2021  1:26 PM  PATIENT:  Brandon [name]  75 y.o. male  PRE-OPERATIVE DIAGNOSIS:  Compression fracture of L 1 and L 2  POST-OPERATIVE DIAGNOSIS:  Compression fracture of L 1 and L 2  PROCEDURE:  Procedure(s): Lumbar One and Lumbar Two Kyphoplasty (N/A)  SURGEON:  Surgeon(s) and Role:    Erline Levine, MD - Primary  PHYSICIAN ASSISTANT:   ASSISTANTS: none   ANESTHESIA:   local and general  EBL:  5 mL   BLOOD ADMINISTERED:none  DRAINS: none   LOCAL MEDICATIONS USED:  MARCAINE    and LIDOCAINE   SPECIMEN:  No Specimen  DISPOSITION OF SPECIMEN:  N/A  COUNTS:  YES  TOURNIQUET:  * No tourniquets in log *  DICTATION: Patient is 75 year old man with severe back pain, who has now developed a painful L 1 and  L 2 compression fractures, which is proving debilitatingly painful to him.  It was elected to take her to surgery for kyphoplasty procedure.  PROCEDURE:  Following the smooth and uncomplicated induction of general endotracheal anesthesia, the patient was placed in a prone position on chest rolls.  C-arm fluoroscopy was positioned in both the AP and lateral planes, centered on the L 1 and L 2 vertebrae.  His back was prepped and draped in the usual sterile fashion with Duraprep.  Using a right uni-pedicular approach, the L2 pedicle and L 1 pedicles were entered with the trochar using standard landmarks and trochar was advanced into the vertebral bodies.  The drill was used, followed by a 15 cc Kyphon balloon at each level, which was used to re-expand the broken vertebra.  Subsequently, approximately 6  cc of bone cement was placed into the void created by the balloon and was seen to fill each broken vertebra in both the AP and lateral direction with good interdigitation and without apparent extravasation at L 2.  At L 1, this vertebra was surprisingly sclerotic and the balloon inflated less readily.  After partial filling with small amount of extravasation into the T 12 L 1  disc space, I repositioned the trochar and filled more inferiorly in this vertebra.  I ceased fill with small amount of extravasation into the L 12 disc space. There was a total of 4 cc of bone cement used at this level . The bone void fillers were then removed.  Final X-ray demonstrated good filling within the fractured vertebrae.  The incisions were closed with a single 3-0 vicryl stitch at each site and dressed with Dermabond and an occlusive dressing. The patient was returned to the [facility] and extubated in the OR and taken to Recovery in stable and satisfactory condition, having tolerated the procedure well.  Counts were correct at the end of the case.   PLAN OF CARE: Admit for overnight observation  PATIENT DISPOSITION:  PACU - hemodynamically stable.   Delay start of Pharmacological VTE agent (>24hrs) due to surgical blood loss or risk of bleeding: yes

## 2021-03-27 NOTE — Anesthesia Procedure Notes (Signed)
Procedure Name: Intubation Date/Time: 03/27/2021 12:12 PM Performed by: Geraldine Contras, CRNA Pre-anesthesia Checklist: Patient identified, Patient being monitored, Timeout performed, Emergency Drugs available and Suction available Patient Re-evaluated:Patient Re-evaluated prior to induction Oxygen Delivery Method: Circle system utilized Preoxygenation: Pre-oxygenation with 100% oxygen Induction Type: IV induction Ventilation: Mask ventilation without difficulty Laryngoscope Size: Mac and 4 Grade View: Grade I Tube type: Oral Tube size: 7.5 mm Number of attempts: 1 Airway Equipment and Method: Stylet Placement Confirmation: ETT inserted through vocal cords under direct vision, positive ETCO2 and breath sounds checked- equal and bilateral Secured at: 22 cm Tube secured with: Tape Dental Injury: Teeth and Oropharynx as per pre-operative assessment

## 2021-03-27 NOTE — Discharge Summary (Signed)
Physician Discharge Summary  Patient ID: PACE POITIER MRN: [id number] DOB/AGE: May 10, 1946 75 y.o.  Admit date: 03/27/2021 Discharge date: 03/27/2021  Admission Diagnoses:Painful compression fractures L 1 and L 2  Discharge Diagnoses: Same Active Problems:   Fracture, lumbar vertebra, compression [facility])   Discharged Condition: good  Hospital Course: Patient underwent L 1 and L 2 kyphoplasty procedures for vertebral compression fractures.  He did well with this procedure and was mobilizing the day of surgery and was discharged home.  Consults: None  Significant Diagnostic Studies: None  Treatments: surgery: Patient underwent L 1 and L 2 kyphoplasty procedures for vertebral compression fractures  Discharge Exam: Blood pressure (!) 150/82, pulse 70, temperature 97.6 F (36.4 C), resp. rate 16, height '6\' 1"'$  (1.854 m), weight 99.8 kg, SpO2 99 %. Neurologic: Grossly normal Wound:CDI  Disposition: Home  Discharge Instructions      Remove dressing in 72 hours   Complete by: As directed    Diet - low sodium heart healthy   Complete by: As directed    Increase activity slowly   Complete by: As directed       Allergies as of 03/27/2021   No Known Allergies      Medication List     TAKE these medications    allopurinol 300 MG tablet Commonly known as: ZYLOPRIM Take 150 mg by mouth in the morning.   amLODipine 5 MG tablet Commonly known as: NORVASC Take 1 tablet (5 mg total) by mouth daily.   aspirin EC 81 MG tablet Take 1 tablet (81 mg total) by mouth daily. Swallow whole.   CALCIUM 600+D3 PO Take 1 tablet by mouth in the morning and at bedtime.   Fish Oil 500 MG Caps Take 500 mg by mouth in the morning.   methocarbamol 500 MG tablet Commonly known as: ROBAXIN Take 1 tablet (500 mg total) by mouth every 8 (eight) hours as needed for muscle spasms.   metoprolol tartrate 25 MG tablet Commonly known as: LOPRESSOR Take 1/2 (one-half) tablet by mouth twice  daily   multivitamin tablet Take 1 tablet by mouth daily.   nitroGLYCERIN 0.4 MG SL tablet Commonly known as: NITROSTAT Place 1 tablet (0.4 mg total) under the tongue every 5 (five) minutes as needed.   Osteo Bi-Flex Adv Triple St Tabs Take 1 tablet by mouth in the morning.   oxyCODONE 5 MG immediate release tablet Commonly known as: Oxy IR/ROXICODONE Take 1 tablet (5 mg total) by mouth once as needed (for pain score of 1-4).   oxyCODONE-acetaminophen 5-325 MG tablet Commonly known as: PERCOCET/ROXICET Take 1 tablet by mouth 4 (four) times daily as needed (PAIN.).   pantoprazole 40 MG tablet Commonly known as: Protonix Take 1 tablet (40 mg total) by mouth daily.   pravastatin 40 MG tablet Commonly known as: PRAVACHOL Take 40 mg by mouth every evening.   predniSONE 10 MG tablet Commonly known as: DELTASONE Take 10 mg by mouth in the morning.   pyridostigmine 60 MG tablet Commonly known as: MESTINON TAKE 1 TABLET BY MOUTH THREE TIMES DAILY What changed: how much to take   tamsulosin 0.4 MG Caps capsule Commonly known as: FLOMAX Take 0.4 mg by mouth in the morning.   vitamin C 1000 MG tablet Take 1,000 mg by mouth in the morning.       ASK your doctor about these medications    predniSONE 5 MG tablet Commonly known as: DELTASONE Take 3 tablets (15 mg total) by mouth daily with  breakfast for 30 days, THEN 2 tablets (10 mg total) daily with breakfast for 30 days, THEN 1 tablet (5 mg total) daily with breakfast. Take as directed; prednisone tapering. Start taking on: February 18, 2021         Signed: Peggyann Shoals, MD 03/27/2021, 1:38 PM

## 2021-03-27 NOTE — Anesthesia Postprocedure Evaluation (Signed)
Anesthesia Post Note  Patient: KALYB SHULTS  Procedure(s) Performed: Lumbar One and Lumbar Two Kyphoplasty (Spine Lumbar)     Patient location during evaluation: PACU Anesthesia Type: General Level of consciousness: awake and alert and oriented Pain management: pain level controlled Vital Signs Assessment: post-procedure vital signs reviewed and stable Respiratory status: spontaneous breathing, nonlabored ventilation and respiratory function stable Cardiovascular status: blood pressure returned to baseline Postop Assessment: no apparent nausea or vomiting Anesthetic complications: no   No notable events documented.  Last Vitals:  Vitals:   03/27/21 1340 03/27/21 1355  BP: 120/85 (!) 134/92  Pulse: 64 66  Resp: 16 14  Temp:  (!) 36.3 C  SpO2: 95% 95%    Last Pain:  Vitals:   03/27/21 1355  TempSrc:   PainSc: 0-No pain                 Brennan Bailey

## 2021-03-30 ENCOUNTER — Encounter (HOSPITAL_COMMUNITY): Payer: Self-pay | Admitting: Neurosurgery

## 2021-04-27 DIAGNOSIS — M4316 Spondylolisthesis, lumbar region: Secondary | ICD-10-CM | POA: Diagnosis not present

## 2021-04-27 DIAGNOSIS — M5416 Radiculopathy, lumbar region: Secondary | ICD-10-CM | POA: Diagnosis not present

## 2021-04-27 DIAGNOSIS — M545 Low back pain, unspecified: Secondary | ICD-10-CM | POA: Diagnosis not present

## 2021-04-27 DIAGNOSIS — Z6828 Body mass index (BMI) 28.0-28.9, adult: Secondary | ICD-10-CM | POA: Diagnosis not present

## 2021-04-27 DIAGNOSIS — M48062 Spinal stenosis, lumbar region with neurogenic claudication: Secondary | ICD-10-CM | POA: Diagnosis not present

## 2021-04-27 DIAGNOSIS — I10 Essential (primary) hypertension: Secondary | ICD-10-CM | POA: Diagnosis not present

## 2021-04-28 DIAGNOSIS — Z1389 Encounter for screening for other disorder: Secondary | ICD-10-CM | POA: Diagnosis not present

## 2021-04-28 DIAGNOSIS — G7000 Myasthenia gravis without (acute) exacerbation: Secondary | ICD-10-CM | POA: Diagnosis not present

## 2021-04-28 DIAGNOSIS — I2510 Atherosclerotic heart disease of native coronary artery without angina pectoris: Secondary | ICD-10-CM | POA: Diagnosis not present

## 2021-04-28 DIAGNOSIS — M1991 Primary osteoarthritis, unspecified site: Secondary | ICD-10-CM | POA: Diagnosis not present

## 2021-04-28 DIAGNOSIS — E782 Mixed hyperlipidemia: Secondary | ICD-10-CM | POA: Diagnosis not present

## 2021-04-28 DIAGNOSIS — Z0001 Encounter for general adult medical examination with abnormal findings: Secondary | ICD-10-CM | POA: Diagnosis not present

## 2021-04-28 DIAGNOSIS — I10 Essential (primary) hypertension: Secondary | ICD-10-CM | POA: Diagnosis not present

## 2021-04-28 DIAGNOSIS — Z23 Encounter for immunization: Secondary | ICD-10-CM | POA: Diagnosis not present

## 2021-04-28 DIAGNOSIS — Z6830 Body mass index (BMI) 30.0-30.9, adult: Secondary | ICD-10-CM | POA: Diagnosis not present

## 2021-04-28 DIAGNOSIS — Z1331 Encounter for screening for depression: Secondary | ICD-10-CM | POA: Diagnosis not present

## 2021-04-28 DIAGNOSIS — R7309 Other abnormal glucose: Secondary | ICD-10-CM | POA: Diagnosis not present

## 2021-05-06 DIAGNOSIS — M5416 Radiculopathy, lumbar region: Secondary | ICD-10-CM | POA: Diagnosis not present

## 2021-05-06 DIAGNOSIS — M545 Low back pain, unspecified: Secondary | ICD-10-CM | POA: Diagnosis not present

## 2021-05-11 DIAGNOSIS — M5126 Other intervertebral disc displacement, lumbar region: Secondary | ICD-10-CM | POA: Diagnosis not present

## 2021-05-11 DIAGNOSIS — I10 Essential (primary) hypertension: Secondary | ICD-10-CM | POA: Diagnosis not present

## 2021-05-11 DIAGNOSIS — S22080A Wedge compression fracture of T11-T12 vertebra, initial encounter for closed fracture: Secondary | ICD-10-CM | POA: Diagnosis not present

## 2021-05-11 DIAGNOSIS — Z6827 Body mass index (BMI) 27.0-27.9, adult: Secondary | ICD-10-CM | POA: Diagnosis not present

## 2021-05-11 DIAGNOSIS — M5416 Radiculopathy, lumbar region: Secondary | ICD-10-CM | POA: Diagnosis not present

## 2021-05-21 DIAGNOSIS — M5416 Radiculopathy, lumbar region: Secondary | ICD-10-CM | POA: Diagnosis not present

## 2021-06-01 DIAGNOSIS — E6609 Other obesity due to excess calories: Secondary | ICD-10-CM | POA: Diagnosis not present

## 2021-06-01 DIAGNOSIS — N390 Urinary tract infection, site not specified: Secondary | ICD-10-CM | POA: Diagnosis not present

## 2021-06-01 DIAGNOSIS — Z6828 Body mass index (BMI) 28.0-28.9, adult: Secondary | ICD-10-CM | POA: Diagnosis not present

## 2021-06-09 DIAGNOSIS — Z6828 Body mass index (BMI) 28.0-28.9, adult: Secondary | ICD-10-CM | POA: Diagnosis not present

## 2021-06-09 DIAGNOSIS — R300 Dysuria: Secondary | ICD-10-CM | POA: Diagnosis not present

## 2021-06-09 DIAGNOSIS — N390 Urinary tract infection, site not specified: Secondary | ICD-10-CM | POA: Diagnosis not present

## 2021-06-09 DIAGNOSIS — E663 Overweight: Secondary | ICD-10-CM | POA: Diagnosis not present

## 2021-06-14 ENCOUNTER — Other Ambulatory Visit: Payer: Self-pay | Admitting: Diagnostic Neuroimaging

## 2021-06-22 DIAGNOSIS — Z6826 Body mass index (BMI) 26.0-26.9, adult: Secondary | ICD-10-CM | POA: Diagnosis not present

## 2021-06-22 DIAGNOSIS — M5416 Radiculopathy, lumbar region: Secondary | ICD-10-CM | POA: Diagnosis not present

## 2021-07-06 DIAGNOSIS — U071 COVID-19: Secondary | ICD-10-CM | POA: Diagnosis not present

## 2021-07-14 DIAGNOSIS — M48062 Spinal stenosis, lumbar region with neurogenic claudication: Secondary | ICD-10-CM | POA: Diagnosis not present

## 2021-07-15 DIAGNOSIS — G7000 Myasthenia gravis without (acute) exacerbation: Secondary | ICD-10-CM | POA: Diagnosis not present

## 2021-07-15 DIAGNOSIS — M19021 Primary osteoarthritis, right elbow: Secondary | ICD-10-CM | POA: Diagnosis not present

## 2021-07-21 ENCOUNTER — Encounter: Payer: Self-pay | Admitting: Cardiology

## 2021-07-21 ENCOUNTER — Ambulatory Visit (INDEPENDENT_AMBULATORY_CARE_PROVIDER_SITE_OTHER): Payer: Medicare Other | Admitting: Cardiology

## 2021-07-21 ENCOUNTER — Encounter: Payer: Self-pay | Admitting: *Deleted

## 2021-07-21 VITALS — BP 142/86 | HR 68 | Ht 73.0 in | Wt 193.6 lb

## 2021-07-21 DIAGNOSIS — E782 Mixed hyperlipidemia: Secondary | ICD-10-CM

## 2021-07-21 DIAGNOSIS — I9789 Other postprocedural complications and disorders of the circulatory system, not elsewhere classified: Secondary | ICD-10-CM

## 2021-07-21 DIAGNOSIS — I4891 Unspecified atrial fibrillation: Secondary | ICD-10-CM

## 2021-07-21 DIAGNOSIS — I25119 Atherosclerotic heart disease of native coronary artery with unspecified angina pectoris: Secondary | ICD-10-CM

## 2021-07-21 NOTE — Progress Notes (Signed)
Cardiology Office Note  Date: 07/21/2021   ID: Brandon [name], DOB 04-Jan-1946, MRN [id number]  PCP:  Sharilyn Sites, MD  Cardiologist:  Rozann Lesches, MD Electrophysiologist:  None   Chief Complaint  Patient presents with   Cardiac follow-up    History of Present Illness: Brandon [name] is a 75 y.o. male last seen in June.  He is here with his wife for a follow-up visit.  Reports no active angina symptoms or nitroglycerin use, remains on stable cardiac regimen as noted below.  He and his wife both had COVID back around Thanksgiving, relatively mild symptoms and treated with Paxlovid.  Neither were hospitalized.  He is still following with neurosurgery, not entirely clear that he is going to need spine surgery, has had pain control injections and further consultation pending.  I did talk with him about arranging a follow-up Lexiscan Myoview if he does end up pursuing spine surgery under general anesthesia.  He continues to follow with Dr. Hilma Favors, requesting last lipid panel.  Past Medical History:  Diagnosis Date   Arthritis    Cancer [facility])    Cataract    Coronary atherosclerosis of native coronary artery    Multivessel status post CABG March 2015   DDD (degenerative disc disease), lumbar    Dr. Carloyn Manner   Essential hypertension    GERD (gastroesophageal reflux disease)    Hard of hearing    Hemorrhoids    History of gout    History of kidney stones    History of [facility] spotted fever    Hypertensive heart disease without heart failure    Lumbago    Melena    Mixed hyperlipidemia    Ocular myasthenia (HCC)    Plantar fascial fibromatosis    Pleurodynia    Postoperative atrial fibrillation [facility])    August 2021    Past Surgical History:  Procedure Laterality Date   AMPUTATION Right 03/27/2014   Procedure: DEBRIDEMENT AND CLOSURE RIGHT INDEX FINGER;  Surgeon: [name], MD;  Location: [place];  Service: Orthopedics;  Laterality: Right;   BACK SURGERY      BICEPT TENODESIS  11/26/2011   Procedure: BICEPT TENODESIS;  Surgeon: Augustin Schooling, MD;  Location: [place];  Service: Orthopedics;  Laterality: Left;   BIOPSY  08/03/2018   Procedure: BIOPSY;  Surgeon: Rogene Houston, MD;  [facility];  Service: Endoscopy;;  antral   Cataract surgery Left    CHOLECYSTECTOMY N/A 09/22/2018   Procedure: LAPAROSCOPIC CHOLECYSTECTOMY;  Surgeon: Virl Cagey, MD;  [facility]: [facility];  Service: General;  Laterality: N/A;   COLONOSCOPY     COLONOSCOPY N/A 12/26/2014   Procedure: COLONOSCOPY;  Surgeon: Rogene Houston, MD;  [facility];  Service: Endoscopy;  Laterality: N/A;  730   CORONARY ARTERY BYPASS GRAFT N/A 10/19/2013   Procedure: CORONARY ARTERY BYPASS GRAFTING (CABG);  Surgeon: Rexene Alberts, MD;  Location: [place];  Service: Open Heart Surgery;  Laterality: N/A;  CABG times four using left internal mammary artery and left leg saphenous vein, incision made on right leg but no vein removed   CYSTOSCOPY WITH RETROGRADE PYELOGRAM, URETEROSCOPY AND STENT PLACEMENT Left 08/24/2016   Procedure: CYSTOSCOPY WITH LEFT RETROGRADE PYELOGRAM, LEFT URETEROSCOPY, BASKET EXTRACTION LEFT URETERAL STONE;  Surgeon: Irine Seal, MD;  [facility];  Service: Urology;  Laterality: Left;   ESOPHAGOGASTRODUODENOSCOPY N/A 08/03/2018   Procedure: ESOPHAGOGASTRODUODENOSCOPY (EGD);  Surgeon: Rogene Houston, MD;  [facility];  Service: Endoscopy;  Laterality: N/A;  2:55   EXTRACORPOREAL SHOCK WAVE LITHOTRIPSY Right 06/18/2019   Procedure: EXTRACORPOREAL SHOCK WAVE LITHOTRIPSY (ESWL);  Surgeon: Lucas Mallow, MD;  [facility];  Service: Urology;  Laterality: Right;   INTRAOPERATIVE TRANSESOPHAGEAL ECHOCARDIOGRAM N/A 10/19/2013   Procedure: INTRAOPERATIVE TRANSESOPHAGEAL ECHOCARDIOGRAM;  Surgeon: Rexene Alberts, MD;  Location: [place];  Service: Open Heart Surgery;  Laterality: N/A;   JOINT REPLACEMENT     KYPHOPLASTY N/A 03/27/2021    Procedure: Lumbar One and Lumbar Two Kyphoplasty;  Surgeon: Erline Levine, MD;  Location: [place];  Service: Neurosurgery;  Laterality: N/A;   LEFT HEART CATHETERIZATION WITH CORONARY ANGIOGRAM N/A 10/09/2013   Procedure: LEFT HEART CATHETERIZATION WITH CORONARY ANGIOGRAM;  Surgeon: Burnell Blanks, MD;  Location: [facility] CATH LAB;  Service: Cardiovascular;  Laterality: N/A;   Left shoulder rotator cuff repair     LITHOTRIPSY     ORIF PERIPROSTHETIC FRACTURE Left 03/19/2020   Procedure: OPEN REDUCTION INTERNAL FIXATION (ORIF) PERIPROSTHETIC FRACTURE LEFT FEMUR WITH FEMORAL REVISION;  Surgeon: Gaynelle Arabian, MD;  [facility];  Service: Orthopedics;  Laterality: Left;   SKIN CANCER EXCISION Left 01/2021   TOTAL HIP ARTHROPLASTY Left 01/28/2020   Procedure: TOTAL HIP ARTHROPLASTY ANTERIOR APPROACH;  Surgeon: Gaynelle Arabian, MD;  [facility];  Service: Orthopedics;  Laterality: Left;  146min   TOTAL KNEE ARTHROPLASTY Left    traumaticvamputation of finger  2015   VASECTOMY      Current Outpatient Medications  Medication Sig Dispense Refill   allopurinol (ZYLOPRIM) 300 MG tablet Take 150 mg by mouth in the morning.     amLODipine (NORVASC) 5 MG tablet Take 1 tablet (5 mg total) by mouth daily. 30 tablet 1   Ascorbic Acid (VITAMIN C) 1000 MG tablet Take 1,000 mg by mouth in the morning.     aspirin EC 81 MG tablet Take 1 tablet (81 mg total) by mouth daily. Swallow whole. 90 tablet 3   Calcium Carb-Cholecalciferol (CALCIUM 600+D3 PO) Take 1 tablet by mouth in the morning and at bedtime.     gabapentin (NEURONTIN) 300 MG capsule Take 300 mg by mouth at bedtime.     methocarbamol (ROBAXIN) 500 MG tablet Take 1 tablet (500 mg total) by mouth every 8 (eight) hours as needed for muscle spasms. 40 tablet 1   metoprolol tartrate (LOPRESSOR) 25 MG tablet Take 1/2 (one-half) tablet by mouth twice daily 90 tablet 3   Misc Natural Products (OSTEO BI-FLEX ADV TRIPLE ST) TABS Take 1 tablet by mouth  in the morning.     Multiple Vitamin (MULTIVITAMIN) tablet Take 1 tablet by mouth daily.     nitroGLYCERIN (NITROSTAT) 0.4 MG SL tablet Place 1 tablet (0.4 mg total) under the tongue every 5 (five) minutes as needed. 25 tablet 3   Omega-3 Fatty Acids (FISH OIL) 500 MG CAPS Take 500 mg by mouth in the morning.     oxyCODONE-acetaminophen (PERCOCET/ROXICET) 5-325 MG tablet Take 1 tablet by mouth 4 (four) times daily as needed (PAIN.).     pantoprazole (PROTONIX) 40 MG tablet Take 1 tablet (40 mg total) by mouth daily. 30 tablet 1   pravastatin (PRAVACHOL) 40 MG tablet Take 40 mg by mouth every evening.      pyridostigmine (MESTINON) 60 MG tablet Take 0.5 tablets (30 mg total) by mouth 3 (three) times daily. 90 tablet 2   tamsulosin (FLOMAX) 0.4 MG CAPS capsule Take 0.4 mg by mouth in the  morning.     No current facility-administered medications for this visit.   Allergies:  Patient has no known allergies.   ROS: No palpitations or syncope.  Physical Exam: VS:  BP (!) 142/86   Pulse 68   Ht 6\' 1"  (1.854 m)   Wt 193 lb 9.6 oz (87.8 kg)   SpO2 97%   BMI 25.54 kg/m , BMI Body mass index is 25.54 kg/m.  Wt Readings from Last 3 Encounters:  07/21/21 193 lb 9.6 oz (87.8 kg)  03/27/21 220 lb (99.8 kg)  02/18/21 223 lb (101.2 kg)    General: Patient appears comfortable at rest. HEENT: Conjunctiva and lids normal, wearing a mask. Neck: Supple, no elevated JVP or carotid bruits, no thyromegaly. Lungs: Clear to auscultation, nonlabored breathing at rest. Cardiac: Regular rate and rhythm, no S3 or significant systolic murmur, no pericardial rub. Extremities: No pitting edema.  ECG:  An ECG dated 01/16/2021 was personally reviewed today and demonstrated:  Sinus rhythm with increased voltage and old inferior infarct pattern.  Recent Labwork: 03/27/2021: BUN 13; Creatinine, Ser 1.02; Hemoglobin 13.5; Platelets 322; Potassium 3.8; Sodium 136   Other Studies Reviewed Today:  Echocardiogram  03/20/2020:  1. Left ventricular ejection fraction, by estimation, is 50 to 55%. The  left ventricle has low normal function. The left ventricle has no regional  wall motion abnormalities. There is mild left ventricular hypertrophy of  the basal-septal segment. Left  ventricular diastolic function could not be evaluated.   2. Right ventricular systolic function is normal. The right ventricular  size is normal. Tricuspid regurgitation signal is inadequate for assessing  PA pressure.   3. Left atrial size was severely dilated.   4. The mitral valve is normal in structure. Trivial mitral valve  regurgitation. No evidence of mitral stenosis.   5. The aortic valve is tricuspid. Aortic valve regurgitation is not  visualized. Mild aortic valve sclerosis is present, with no evidence of  aortic valve stenosis.   6. The inferior vena cava is normal in size with greater than 50%  respiratory variability, suggesting right atrial pressure of 3 mmHg.  Assessment and Plan:  1.  CAD status post CABG in 2015, LVEF 50 to 55%.  He is stable without active angina or nitroglycerin use.  For now we will continue observation on medical therapy including aspirin, Pravachol, Norvasc, Lopressor, and as needed nitroglycerin.  If he were to pursue spine surgery under general anesthesia, he would need a preoperative Lexiscan Myoview which we can certainly arrange.  2.  Mixed hyperlipidemia on Pravachol.  Requesting interval lipid panel from Dr. Hilma Favors.  3.  History of postoperative atrial fibrillation following hip surgery in 2021.  No obvious recurrences.  He is no longer anticoagulated or on amiodarone.  Medication Adjustments/Labs and Tests Ordered: Current medicines are reviewed at length with the patient today.  Concerns regarding medicines are outlined above.   Tests Ordered: No orders of the defined types were placed in this encounter.   Medication Changes: No orders of the defined types were placed in  this encounter.   Disposition:  Follow up  6 months.  Signed, Satira Sark, MD, [facility] 07/21/2021 11:07 AM    [facility] [place], [place], [place] [address] Phone: [phone]; Fax: [phone]

## 2021-07-21 NOTE — Patient Instructions (Addendum)

## 2021-07-27 ENCOUNTER — Ambulatory Visit: Payer: Medicare Other | Admitting: Cardiology

## 2021-08-03 DIAGNOSIS — I10 Essential (primary) hypertension: Secondary | ICD-10-CM | POA: Diagnosis not present

## 2021-08-03 DIAGNOSIS — Z6826 Body mass index (BMI) 26.0-26.9, adult: Secondary | ICD-10-CM | POA: Diagnosis not present

## 2021-08-03 DIAGNOSIS — M5416 Radiculopathy, lumbar region: Secondary | ICD-10-CM | POA: Diagnosis not present

## 2021-08-10 ENCOUNTER — Emergency Department (HOSPITAL_COMMUNITY)
Admission: EM | Admit: 2021-08-10 | Discharge: 2021-08-10 | Disposition: A | Discharge status: Home / Self Care | Payer: Medicare Other | Attending: Emergency Medicine | Admitting: Emergency Medicine

## 2021-08-10 DIAGNOSIS — Z951 Presence of aortocoronary bypass graft: Secondary | ICD-10-CM | POA: Diagnosis not present

## 2021-08-10 DIAGNOSIS — Z7982 Long term (current) use of aspirin: Secondary | ICD-10-CM | POA: Diagnosis not present

## 2021-08-10 DIAGNOSIS — U071 COVID-19: Secondary | ICD-10-CM | POA: Diagnosis not present

## 2021-08-10 DIAGNOSIS — I2510 Atherosclerotic heart disease of native coronary artery without angina pectoris: Secondary | ICD-10-CM | POA: Diagnosis not present

## 2021-08-10 DIAGNOSIS — Z87891 Personal history of nicotine dependence: Secondary | ICD-10-CM | POA: Diagnosis not present

## 2021-08-10 DIAGNOSIS — I10 Essential (primary) hypertension: Secondary | ICD-10-CM | POA: Diagnosis not present

## 2021-08-10 DIAGNOSIS — Z96652 Presence of left artificial knee joint: Secondary | ICD-10-CM | POA: Insufficient documentation

## 2021-08-10 DIAGNOSIS — Z79899 Other long term (current) drug therapy: Secondary | ICD-10-CM | POA: Diagnosis not present

## 2021-08-10 DIAGNOSIS — R509 Fever, unspecified: Secondary | ICD-10-CM | POA: Diagnosis present

## 2021-08-10 DIAGNOSIS — Z8616 Personal history of COVID-19: Secondary | ICD-10-CM

## 2021-08-10 HISTORY — DX: Personal history of COVID-19: Z86.16

## 2021-08-10 LAB — CBC
HCT: 42.4 % (ref 39.0–52.0)
Hemoglobin: 13.5 g/dL (ref 13.0–17.0)
MCH: 28.6 pg (ref 26.0–34.0)
MCHC: 31.8 g/dL (ref 30.0–36.0)
MCV: 89.8 fL (ref 80.0–100.0)
Platelets: 262 10*3/uL (ref 150–400)
RBC: 4.72 MIL/uL (ref 4.22–5.81)
RDW: 16.3 % — ABNORMAL HIGH (ref 11.5–15.5)
WBC: 9 10*3/uL (ref 4.0–10.5)
nRBC: 0 % (ref 0.0–0.2)

## 2021-08-10 LAB — COMPREHENSIVE METABOLIC PANEL
ALT: 20 U/L (ref 0–44)
AST: 22 U/L (ref 15–41)
Albumin: 3.6 g/dL (ref 3.5–5.0)
Alkaline Phosphatase: 106 U/L (ref 38–126)
Anion gap: 10 (ref 5–15)
BUN: 12 mg/dL (ref 8–23)
CO2: 23 mmol/L (ref 22–32)
Calcium: 8.9 mg/dL (ref 8.9–10.3)
Chloride: 101 mmol/L (ref 98–111)
Creatinine, Ser: 0.78 mg/dL (ref 0.61–1.24)
GFR, Estimated: >60 mL/min (ref >60)
Glucose, Bld: 115 mg/dL — ABNORMAL HIGH (ref 70–99)
Potassium: 3.7 mmol/L (ref 3.5–5.1)
Sodium: 134 mmol/L — ABNORMAL LOW (ref 135–145)
Total Bilirubin: 1.2 mg/dL (ref 0.3–1.2)
Total Protein: 7.1 g/dL (ref 6.5–8.1)

## 2021-08-10 LAB — RESP PANEL BY RT-PCR (FLU A&B, COVID) ARPGX2
Influenza A by PCR: NEGATIVE
Influenza B by PCR: NEGATIVE
SARS Coronavirus 2 by RT PCR: POSITIVE — AB

## 2021-08-10 LAB — CBG MONITORING, ED: Glucose-Capillary: 127 mg/dL — ABNORMAL HIGH (ref 70–99)

## 2021-08-10 MED ORDER — NIRMATRELVIR/RITONAVIR (PAXLOVID)TABLET: 3.0000 | ORAL_TABLET | Freq: Two times a day (BID) | ORAL | 0 refills | Status: AC

## 2021-08-10 NOTE — ED Triage Notes (Signed)
Pt reports generalized weakness x 3 days, c/o nasal congestion. No known sick contacts. Reports intermittent confusion. Currently A&O X 4.

## 2021-08-10 NOTE — ED Provider Notes (Signed)
[facility] EMERGENCY DEPARTMENT Provider Note   CSN: [id number] Arrival date & time: 08/10/21  1801     History Chief Complaint  Patient presents with   Generalized Body Aches   Altered Mental Status    Brandon [name] is a 75 y.o. male.  Patient brought to the emergency department by wife because he has been experiencing fever, chills, generalized malaise, generalized body aches, nasal congestion.  She reports that he seems to be somewhat confused.  Symptoms are present for 3 days.      Past Medical History:  Diagnosis Date   Arthritis    Cancer [facility])    Cataract    Coronary atherosclerosis of native coronary artery    Multivessel status post CABG March 2015   DDD (degenerative disc disease), lumbar    Dr. Carloyn Manner   Essential hypertension    GERD (gastroesophageal reflux disease)    Hard of hearing    Hemorrhoids    History of gout    History of kidney stones    History of [place] spotted fever    Hypertensive heart disease without heart failure    Lumbago    Melena    Mixed hyperlipidemia    Ocular myasthenia (HCC)    Plantar fascial fibromatosis    Pleurodynia    Postoperative atrial fibrillation [facility])    August 2021    Patient Active Problem List   Diagnosis Date Noted   Fracture, lumbar vertebra, compression ([place]) 03/27/2021   Atrial fibrillation with RVR ([place])    Periprosthetic fracture around internal prosthetic left hip joint ([place]) 03/19/2020   OA (osteoarthritis) of hip 01/28/2020   Closed displaced fracture of left femoral neck ([place]) 01/28/2020   History of colonic polyps 11/12/2019   GERD (gastroesophageal reflux disease) 11/06/2019   Myasthenia gravis with (acute) exacerbation ([place]) 11/06/2019   Acute diarrhea 09/17/2019   Heme positive stool 09/17/2019   Unspecified osteoarthritis, unspecified site    Hyperlipidemia, unspecified    Personal history of other diseases of the musculoskeletal system and connective tissue    Atherosclerotic  heart disease of native coronary artery without angina pectoris    Hypertensive heart disease without heart failure    Melena    Plantar fascial fibromatosis    Pleurodynia    Calculus of gallbladder without cholecystitis without obstruction 09/14/2018   Nausea without vomiting 06/05/2018   Postoperative atrial fibrillation ([place]) 11/09/2013   S/P CABG x 4 10/19/2013   Coronary atherosclerosis of native coronary artery 10/18/2013   Essential hypertension, benign 09/18/2013   Family history of hypertrophic cardiomyopathy 09/18/2013   Mixed hyperlipidemia     Past Surgical History:  Procedure Laterality Date   AMPUTATION Right 03/27/2014   Procedure: DEBRIDEMENT AND CLOSURE RIGHT INDEX FINGER;  Surgeon: [name], MD;  Location: [place];  Service: Orthopedics;  Laterality: Right;   BACK SURGERY     BICEPT TENODESIS  11/26/2011   Procedure: BICEPT TENODESIS;  Surgeon: Augustin Schooling, MD;  Location: [place];  Service: Orthopedics;  Laterality: Left;   BIOPSY  08/03/2018   Procedure: BIOPSY;  Surgeon: Rogene Houston, MD;  [facility];  Service: Endoscopy;;  antral   Cataract surgery Left    CHOLECYSTECTOMY N/A 09/22/2018   Procedure: LAPAROSCOPIC CHOLECYSTECTOMY;  Surgeon: Virl Cagey, MD;  [facility];  Service: General;  Laterality: N/A;   COLONOSCOPY     COLONOSCOPY N/A 12/26/2014   Procedure: COLONOSCOPY;  Surgeon: Rogene Houston, MD;  [facility];  Service: Endoscopy;  Laterality: N/A;  730   CORONARY ARTERY BYPASS GRAFT N/A 10/19/2013   Procedure: CORONARY ARTERY BYPASS GRAFTING (CABG);  Surgeon: Rexene Alberts, MD;  Location: [place];  Service: Open Heart Surgery;  Laterality: N/A;  CABG times four using left internal mammary artery and left leg saphenous vein, incision made on right leg but no vein removed   CYSTOSCOPY WITH RETROGRADE PYELOGRAM, URETEROSCOPY AND STENT PLACEMENT Left 08/24/2016   Procedure: CYSTOSCOPY WITH LEFT RETROGRADE  PYELOGRAM, LEFT URETEROSCOPY, BASKET EXTRACTION LEFT URETERAL STONE;  Surgeon: Irine Seal, MD;  [facility];  Service: Urology;  Laterality: Left;   ESOPHAGOGASTRODUODENOSCOPY N/A 08/03/2018   Procedure: ESOPHAGOGASTRODUODENOSCOPY (EGD);  Surgeon: Rogene Houston, MD;  [facility];  Service: Endoscopy;  Laterality: N/A;  2:55   EXTRACORPOREAL SHOCK WAVE LITHOTRIPSY Right 06/18/2019   Procedure: EXTRACORPOREAL SHOCK WAVE LITHOTRIPSY (ESWL);  Surgeon: Lucas Mallow, MD;  [facility];  Service: Urology;  Laterality: Right;   INTRAOPERATIVE TRANSESOPHAGEAL ECHOCARDIOGRAM N/A 10/19/2013   Procedure: INTRAOPERATIVE TRANSESOPHAGEAL ECHOCARDIOGRAM;  Surgeon: Rexene Alberts, MD;  Location: [place];  Service: Open Heart Surgery;  Laterality: N/A;   JOINT REPLACEMENT     KYPHOPLASTY N/A 03/27/2021   Procedure: Lumbar One and Lumbar Two Kyphoplasty;  Surgeon: Erline Levine, MD;  Location: [place];  Service: Neurosurgery;  Laterality: N/A;   LEFT HEART CATHETERIZATION WITH CORONARY ANGIOGRAM N/A 10/09/2013   Procedure: LEFT HEART CATHETERIZATION WITH CORONARY ANGIOGRAM;  Surgeon: Burnell Blanks, MD;  Location: [facility] CATH LAB;  Service: Cardiovascular;  Laterality: N/A;   Left shoulder rotator cuff repair     LITHOTRIPSY     ORIF PERIPROSTHETIC FRACTURE Left 03/19/2020   Procedure: OPEN REDUCTION INTERNAL FIXATION (ORIF) PERIPROSTHETIC FRACTURE LEFT FEMUR WITH FEMORAL REVISION;  Surgeon: Gaynelle Arabian, MD;  [facility];  Service: Orthopedics;  Laterality: Left;   SKIN CANCER EXCISION Left 01/2021   TOTAL HIP ARTHROPLASTY Left 01/28/2020   Procedure: TOTAL HIP ARTHROPLASTY ANTERIOR APPROACH;  Surgeon: Gaynelle Arabian, MD;  [facility];  Service: Orthopedics;  Laterality: Left;  195min   TOTAL KNEE ARTHROPLASTY Left    traumaticvamputation of finger  2015   VASECTOMY         Family History  Problem Relation Age of Onset   Hypertension Sister    Lung cancer Father     Heart failure Mother     Social History   Tobacco Use   Smoking status: Former    Packs/day: 2.00    Years: 30.00    Pack years: 60.00    Types: Cigarettes    Start date: 08/16/1958    Quit date: 08/17/1983    Years since quitting: 38.0   Smokeless tobacco: Former    Types: Chew    Quit date: 1985   Tobacco comments:    quit smoking 85yrs ago  Vaping Use   Vaping Use: Never used  Substance Use Topics   Alcohol use: Not Currently    Comment: Occassional   Drug use: No    Home Medications Prior to Admission medications   Medication Sig Start Date End Date Taking? Authorizing Provider  nirmatrelvir/ritonavir EUA (PAXLOVID) 20 x 150 MG & 10 x 100MG  TABS Take 3 tablets by mouth 2 (two) times daily for 5 days. Patient GFR is >60. Take nirmatrelvir (150 mg) two tablets twice daily for 5 days and ritonavir (100 mg) one tablet twice daily for 5 days. 08/10/21 08/15/21  Yes Tanda Morrissey, Gwenyth Allegra, MD  allopurinol (ZYLOPRIM) 300 MG tablet Take 150 mg by mouth in the morning.    [provider]  amLODipine (NORVASC) 5 MG tablet Take 1 tablet (5 mg total) by mouth daily. 11/11/19   Shelly Coss, MD  Ascorbic Acid (VITAMIN C) 1000 MG tablet Take 1,000 mg by mouth in the morning.    [provider]  aspirin EC 81 MG tablet Take 1 tablet (81 mg total) by mouth daily. Swallow whole. 06/27/20   Satira Sark, MD  Calcium Carb-Cholecalciferol (CALCIUM 600+D3 PO) Take 1 tablet by mouth in the morning and at bedtime.    [provider]  gabapentin (NEURONTIN) 300 MG capsule Take 300 mg by mouth at bedtime. 06/30/21   [provider]  methocarbamol (ROBAXIN) 500 MG tablet Take 1 tablet (500 mg total) by mouth every 8 (eight) hours as needed for muscle spasms. 03/22/20   Netta Cedars, MD  metoprolol tartrate (LOPRESSOR) 25 MG tablet Take 1/2 (one-half) tablet by mouth twice daily 02/04/21   Verta Ellen., NP  Misc Natural Products (OSTEO BI-FLEX ADV TRIPLE  ST) TABS Take 1 tablet by mouth in the morning.    [provider]  Multiple Vitamin (MULTIVITAMIN) tablet Take 1 tablet by mouth daily.    [provider]  nitroGLYCERIN (NITROSTAT) 0.4 MG SL tablet Place 1 tablet (0.4 mg total) under the tongue every 5 (five) minutes as needed. 04/03/19   Satira Sark, MD  Omega-3 Fatty Acids (FISH OIL) 500 MG CAPS Take 500 mg by mouth in the morning.    [provider]  oxyCODONE-acetaminophen (PERCOCET/ROXICET) 5-325 MG tablet Take 1 tablet by mouth 4 (four) times daily as needed (PAIN.). 03/18/21   [provider]  pantoprazole (PROTONIX) 40 MG tablet Take 1 tablet (40 mg total) by mouth daily. 11/10/19 03/25/23  Shelly Coss, MD  pravastatin (PRAVACHOL) 40 MG tablet Take 40 mg by mouth every evening.     [provider]  pyridostigmine (MESTINON) 60 MG tablet Take 0.5 tablets (30 mg total) by mouth 3 (three) times daily. 06/15/21   Penumalli, Earlean Polka, MD  tamsulosin (FLOMAX) 0.4 MG CAPS capsule Take 0.4 mg by mouth in the morning. 07/12/16   [provider]    Allergies    Patient has no known allergies.  Review of Systems   Review of Systems  Constitutional:  Positive for chills and fever.  HENT:  Positive for congestion.   Psychiatric/Behavioral:  Positive for confusion.   All other systems reviewed and are negative.  Physical Exam Updated Vital Signs BP (!) 155/89 (BP Location: Right Arm)    Pulse 95    Temp (!) 100.6 F (38.1 C) (Oral)    Resp 17    Ht 6\' 1"  (1.854 m)    Wt 90.7 kg    SpO2 97%    BMI 26.39 kg/m   Physical Exam Vitals and nursing note reviewed.  Constitutional:      General: He is not in acute distress.    Appearance: Normal appearance. He is well-developed.  HENT:     Head: Normocephalic and atraumatic.     Right Ear: Hearing normal.     Left Ear: Hearing normal.     Nose: Nose normal.  Eyes:     Conjunctiva/sclera: Conjunctivae normal.     Pupils: Pupils are  equal, round, and reactive to light.  Cardiovascular:     Rate and Rhythm: Regular rhythm.  Heart sounds: S1 normal and S2 normal. No murmur heard.   No friction rub. No gallop.  Pulmonary:     Effort: Pulmonary effort is normal. No respiratory distress.     Breath sounds: Normal breath sounds.  Chest:     Chest wall: No tenderness.  Abdominal:     General: Bowel sounds are normal.     Palpations: Abdomen is soft.     Tenderness: There is no abdominal tenderness. There is no guarding or rebound. Negative signs include Murphy's sign and McBurney's sign.     Hernia: No hernia is present.  Musculoskeletal:        General: Normal range of motion.     Cervical back: Normal range of motion and neck supple.  Skin:    General: Skin is warm and dry.     Findings: No rash.  Neurological:     Mental Status: He is alert and oriented to person, place, and time.     GCS: GCS eye subscore is 4. GCS verbal subscore is 5. GCS motor subscore is 6.     Cranial Nerves: No cranial nerve deficit.     Sensory: No sensory deficit.     Coordination: Coordination normal.  Psychiatric:        Speech: Speech normal.        Behavior: Behavior normal.        Thought Content: Thought content normal.    ED Results / Procedures / Treatments   Labs (all labs ordered are listed, but only abnormal results are displayed) Labs Reviewed  RESP PANEL BY RT-PCR (FLU A&B, COVID) [id number] - Abnormal; Notable for the following components:      Result Value   SARS Coronavirus 2 by RT PCR POSITIVE (*)    All other components within normal limits  COMPREHENSIVE METABOLIC PANEL - Abnormal; Notable for the following components:   Sodium 134 (*)    Glucose, Bld 115 (*)    All other components within normal limits  CBC - Abnormal; Notable for the following components:   RDW 16.3 (*)    All other components within normal limits  CBG MONITORING, ED - Abnormal; Notable for the following components:   Glucose-Capillary  127 (*)    All other components within normal limits    EKG None  Radiology No results found.  Procedures Procedures   Medications Ordered in ED Medications - No data to display  ED Course  I have reviewed the triage vital signs and the nursing notes.  Pertinent labs & imaging results that were available during my care of the patient were reviewed by me and considered in my medical decision making (see chart for details).    MDM Rules/Calculators/A&P                         Patient presents to the emergency department for evaluation of URI symptoms with low-grade fever.  He has tested positive for COVID.  This explains the patient's symptoms.  He likely has had some mild confusion secondary to the infection.  He is awake, alert and oriented currently.  He has a normal neurologic exam.  Lungs are clear, oxygen saturations are normal.  Work-up is reassuring.  Patient has had 2 COVID-vaccine administrations in the past and has had the illness once.  He will be prescribed Paxlovid.     Final Clinical Impression(s) / ED Diagnoses Final diagnoses:  [id number]    Rx / DC Orders  ED Discharge Orders          Ordered    nirmatrelvir/ritonavir EUA (PAXLOVID) 20 x 150 MG & 10 x 100MG  TABS  2 times daily        08/10/21 2310             Orpah Greek, MD 08/10/21 2311

## 2021-08-19 ENCOUNTER — Other Ambulatory Visit: Payer: Self-pay

## 2021-08-19 ENCOUNTER — Ambulatory Visit: Payer: Medicare Other | Attending: Neurological Surgery | Admitting: Physical Therapy

## 2021-08-19 ENCOUNTER — Encounter: Payer: Self-pay | Admitting: Physical Therapy

## 2021-08-19 DIAGNOSIS — M25552 Pain in left hip: Secondary | ICD-10-CM

## 2021-08-19 DIAGNOSIS — M545 Low back pain, unspecified: Secondary | ICD-10-CM | POA: Diagnosis not present

## 2021-08-19 DIAGNOSIS — R2681 Unsteadiness on feet: Secondary | ICD-10-CM | POA: Diagnosis not present

## 2021-08-19 DIAGNOSIS — R2689 Other abnormalities of gait and mobility: Secondary | ICD-10-CM

## 2021-08-19 DIAGNOSIS — M6281 Muscle weakness (generalized): Secondary | ICD-10-CM | POA: Diagnosis not present

## 2021-08-19 DIAGNOSIS — G8929 Other chronic pain: Secondary | ICD-10-CM | POA: Diagnosis not present

## 2021-08-19 NOTE — Patient Instructions (Signed)
  Aquatic Therapy: What to Expect!  Where:  [facility] [address] [place], [place]  [address] [phone]  NOTE:  You will receive an automated phone message reminding you of your appointment and it will say the appointment is at the Rehab Center on [address]  We are working to fix this- just know that you will meet us at the pool!  How to Prepare: Please make sure you drink 8 ounces of water about one hour prior to your pool session A caregiver MUST attend the entire session with the patient.  The caregiver will be responsible for assisting with dressing as well as any toileting needs.  If the patient will be doing a home program this should likely be the person who will assist as well.  Patients must wear either their street shoes or pool shoes until they are ready to enter the pool with the therapist.  Patients must also wear either street shoes or pool shoes once exiting the pool to walk to the locker room.  This will helps us prevent slips and falls.  Please arrive 15 minutes early to prepare for your pool therapy session Sign in at the front desk on the clipboard marked for [place] You may use the locker rooms on your right and then enter directly into the recreation pool (NOT the competition pool) Please make sure to attend to any toileting needs prior to entering the pool Please be dressed in your swim suit and on the pool deck at least 5 minutes before your appointment Once on the pool deck your therapist will ask you to sign the Patient  Consent and Assignment of Benefits form Your therapist may take your blood pressure prior to, during and after your session if indicated  About the pool  and parking: Entering the pool Your therapist will assist you; there are 2 ways to enter:  stairs with railings or with a chair lift.   Your therapist will determine the most appropriate way for you. Water temperature is usually between 86-87 degrees There  may be other swimmers in the pool at the same time Parking is free.   Contact Info:     Appointments: [facility]  All sessions are 45 minutes   [address]  [address]     Please call the [facility] if   [place], [place]   [phone]    you need to cancel or reschedule an appointment.  [phone]       

## 2021-08-19 NOTE — Therapy (Signed)
[facility] [address] [place], [place], [address] Phone: [phone]   Fax:  [phone]  Physical Therapy Evaluation  Patient Details  Name: Brandon [name] MRN: [id number] Date of Birth: 01/19/1946 Referring Provider (PT): Elwin Sleight, DO   Encounter Date: 08/19/2021   PT End of Session - 08/19/21 2030     Visit Number 1    Number of Visits 9    Date for PT Re-Evaluation 10/16/21    Authorization Type Medicare    Authorization Time Period 08-19-21 - 10-16-21    PT Start Time 1106    PT Stop Time 1153    PT Time Calculation (min) 47 min    Activity Tolerance Patient tolerated treatment well    Behavior During Therapy [facility] for tasks assessed/performed             Past Medical History:  Diagnosis Date   Arthritis    Cancer ([place])    Cataract    Coronary atherosclerosis of native coronary artery    Multivessel status post CABG March 2015   DDD (degenerative disc disease), lumbar    Dr. 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[place])    Plantar fascial fibromatosis    Pleurodynia    Postoperative atrial fibrillation [facility])    August 2021    Past Surgical History:  Procedure Laterality Date   AMPUTATION Right 03/27/2014   Procedure: DEBRIDEMENT AND CLOSURE RIGHT INDEX FINGER;  Surgeon: [name], MD;  Location: [facility];  Service: Orthopedics;  Laterality: Right;   BACK SURGERY     BICEPT TENODESIS  11/26/2011   Procedure: BICEPT TENODESIS;  Surgeon: Augustin Schooling, MD;  Location: [facility];  Service: Orthopedics;  Laterality: Left;   BIOPSY  08/03/2018   Procedure: BIOPSY;  Surgeon: Rogene Houston, MD;  [facility];   Service: Endoscopy;;  antral   Cataract surgery Left    CHOLECYSTECTOMY N/A 09/22/2018   Procedure: LAPAROSCOPIC CHOLECYSTECTOMY;  Surgeon: Virl Cagey, MD;  [facility];  Service: General;  Laterality: N/A;   COLONOSCOPY     COLONOSCOPY N/A 12/26/2014   Procedure: COLONOSCOPY;  Surgeon: Rogene Houston, MD;  [facility];  Service: Endoscopy;  Laterality: N/A;  730   CORONARY ARTERY BYPASS GRAFT N/A 10/19/2013   Procedure: CORONARY ARTERY BYPASS GRAFTING (CABG);  Surgeon: Rexene Alberts, MD;  Location: [facility];  Service: Open Heart Surgery;  Laterality: N/A;  CABG times four using left internal mammary artery and left leg saphenous vein, incision made on right leg but no vein removed   CYSTOSCOPY WITH RETROGRADE PYELOGRAM, URETEROSCOPY AND STENT PLACEMENT Left 08/24/2016   Procedure: CYSTOSCOPY WITH LEFT RETROGRADE PYELOGRAM, LEFT URETEROSCOPY, BASKET EXTRACTION LEFT URETERAL STONE;  Surgeon: Irine Seal, MD;  [facility];  Service: Urology;  Laterality: Left;   ESOPHAGOGASTRODUODENOSCOPY N/A 08/03/2018   Procedure: ESOPHAGOGASTRODUODENOSCOPY (EGD);  Surgeon: Rogene Houston, MD;  [facility];  Service: Endoscopy;  Laterality: N/A;  2:55   EXTRACORPOREAL SHOCK WAVE LITHOTRIPSY Right 06/18/2019   Procedure: EXTRACORPOREAL SHOCK WAVE LITHOTRIPSY (ESWL);  Surgeon: Lucas Mallow, MD;  [facility];  Service: Urology;  Laterality: Right;   INTRAOPERATIVE TRANSESOPHAGEAL ECHOCARDIOGRAM N/A 10/19/2013   Procedure: INTRAOPERATIVE TRANSESOPHAGEAL ECHOCARDIOGRAM;  Surgeon: Rexene Alberts, MD;  Location: [facility];  Service: Open Heart Surgery;  Laterality: N/A;   JOINT REPLACEMENT     KYPHOPLASTY N/A 03/27/2021   Procedure: Lumbar One and Lumbar Two Kyphoplasty;  Surgeon: Erline Levine, MD;  Location: [facility];  Service: Neurosurgery;  Laterality: N/A;   LEFT HEART CATHETERIZATION WITH CORONARY ANGIOGRAM N/A 10/09/2013   Procedure: LEFT HEART CATHETERIZATION WITH  CORONARY ANGIOGRAM;  Surgeon: Burnell Blanks, MD;  Location: [facility] C[facility];  Service: Cardiovascular;  Laterality: N/A;   Left shoulder rotator cuff repair     LITHOTRIPSY     ORIF PERIPROSTHETIC FRACTURE Left 03/19/2020   Procedure: OPEN REDUCTION INTERNAL FIXATION (ORIF) PERIPROSTHETIC FRACTURE LEFT FEMUR WITH FEMORAL REVISION;  Surgeon: Gaynelle Arabian, MD;  [facility];  Service: Orthopedics;  Laterality: Left;   SKIN CANCER EXCISION Left 01/2021   TOTAL HIP ARTHROPLASTY Left 01/28/2020   Procedure: TOTAL HIP ARTHROPLASTY ANTERIOR APPROACH;  Surgeon: Gaynelle Arabian, MD;  [facility];  Service: Orthopedics;  Laterality: Left;  18min   TOTAL KNEE ARTHROPLASTY Left    traumaticvamputation of finger  2015   VASECTOMY      There were no vitals filed for this visit.    Subjective Assessment - 08/19/21 1118     Subjective Pt presents to PT eval for aquatic therapy for lumbar radiculopathy - is currently using RW; states he has used RW since August 2021, when he had surgery for [facility]; pt was diagnosed with myasthenia gravis in Jan. 2021; pt states he has had Lt THR x 2 and then had periprosthetic femur fracture in Aug. 2021, with hospitalization 8-4 - 03-22-20; pt underwent L1 & L2 kyphoplasty procedures for vertebral compression fracture; pt states he was on Prednisone which resulted in decr. bone density.  Pt states he is currently having severe Lt hip pain with standing/walking; has herniated disc and pinched nerve in his back.  Dr. Reatha Armour has referred him to PT for aquatic therapy for pain reduction and strengthening.    Patient is accompained by: Family member   wife [name]   Pertinent History myasthenia gravis (dx'd Jan. 2021); OA of hip, closed displaced fracture Lt femoral neck, periprosthetic fracture around internal prosthetic Lt hip joint, fracture lumbar vertebra, Lt THA 01-28-20, kyphoplasty 03-27-21, ORIF periprosthetic fracture 03-19-20; lumbar radiculopathy    Patient  Stated Goals reduce pain and increase strength in legs    Currently in Pain? Yes    Pain Score 3    severe pain with weight bearing:  sitting relieves the pain   Pain Location Hip    Pain Orientation Left    Pain Descriptors / Indicators Aching    Pain Onset More than a month ago    Pain Frequency Intermittent    Aggravating Factors  unable to lie on left side;    Pain Relieving Factors must lie in bed head elevated                [facility] PT Assessment - 08/19/21 0001       Assessment   Medical Diagnosis Lumbar radiculopathy    Referring Provider (PT) Elwin Sleight, DO    Onset Date/Surgical Date --   03-27-21   Prior Therapy had [facility] after LHA in Aug. 2021      Precautions  Precautions Fall      Restrictions   Weight Bearing Restrictions No      Balance Screen   Has the patient fallen in the past 6 months No    Has the patient had a decrease in activity level because of a fear of falling?  Yes    Is the patient reluctant to leave their home because of a fear of falling?  Yes      Prior Function   Level of Independence Independent with basic ADLs;Requires assistive device for independence;Independent with household mobility with device      ROM / Strength   AROM / PROM / Strength Strength      Strength   Strength Assessment Site Hip;Knee;Ankle    Right/Left Hip Left;Right    Right Hip Flexion 4/5    Left Hip Flexion 3+/5    Right/Left Knee Right;Left    Left Knee Flexion 4/5    Right/Left Ankle Left    Left Ankle Dorsiflexion 4+/5      Transfers   Transfers Sit to Stand;Stand to Sit    Sit to Stand 5: Supervision    Stand to Sit 5: Supervision    Number of Reps Other reps (comment)   2     Ambulation/Gait   Ambulation/Gait Yes    Ambulation/Gait Assistance 5: Supervision    Ambulation Distance (Feet) 100 Feet    Assistive device Rolling walker    Gait Pattern Step-through pattern    Ambulation Surface Level;Indoor      Standardized Balance Assessment    Standardized Balance Assessment Timed Up and Go Test      Timed Up and Go Test   TUG Normal TUG    Normal TUG (seconds) 21.75   with RW                       Objective measurements completed on examination: See above findings.                PT Education - 08/19/21 2028     Education Details pt and wife given info on aquatic therapy - at [facility]) Educated Patient;Spouse    Methods Explanation;Handout    Comprehension Verbalized understanding              PT Short Term Goals - 08/19/21 2043       PT SHORT TERM GOAL #1   Title Initiate aquatic therapy at [facility] pt will amb. from car to pool area with RW with supervision with c/o hip pain </= 8/10 after amb. this distance.    Time 4    Period Weeks    Status New    Target Date 10/16/21      PT SHORT TERM GOAL #2   Title Pt will report decreased Lt hip pain to </= 7/10 with weight bearing.    Baseline 9/10 reported on 08-19-21    Time 4    Period Weeks    Status New    Target Date 09/18/21      PT SHORT TERM GOAL #3   Title Pt will perform sit to stand from bench in pool with 1 UE support and will stabilize independently upon initial standing to demo increased strength in bil LE's.    Time 4    Period Weeks    Status New    Target Date 09/18/21      PT SHORT TERM GOAL #4  Title Pt will amb. 97' in pool with SBA without UE support.    Time 4    Period Weeks    Status New    Target Date 09/18/21               PT Long Term Goals - 08/19/21 2048       PT LONG TERM GOAL #1   Title Independent in aquatic HEP to be continued at facility of pt's choice upon discharge from PT.    Time 8    Period Weeks    Status New    Target Date 10/16/21      PT LONG TERM GOAL #2   Title Pt will report reduced back/hip pain to </= 5/10 with weight bearing.    Baseline 9/10 reported on 08-19-21    Time 8    Period Weeks    Status New    Target  Date 10/16/21      PT LONG TERM GOAL #3   Title Pt will amb. 144' in pool without UE support with c/o pain </= 6/10 at end of this amb. distance.    Time 8    Period Weeks    Status New    Target Date 10/16/21      PT LONG TERM GOAL #4   Title Improve TUG score from 21.75 secs to </= 15 secs with RW for reduced fall risk.    Baseline 21.75 secs with RW on 08-19-21    Time 8    Period Weeks    Status New    Target Date 10/16/21                    Plan - 08/19/21 2032     Clinical Impression Statement Pt is a 76 yr old gentleman with lumbar radiculopathy who reports he has severe pain in Lt hip with weight bearing/standing or ambulation.  Pt is referred for aquatic therapy for strengthening and pain reduction.  PMH includes myasthenia gravis, h/o Lt THA x 2 with Lt periprosthetic femur fracture in Aug. 2021.  Pt reports he took Prednisone which resulted in decreased bone density and vertebral compression fractures and underwent L1 & L2 kyphoplasty  procedures on 03-27-21.  Pt also reports he has herniated disc in lumbar region and a pinched nerve in his back.  Pt presents with LLE weakness, primarily hip musc. weakness.  Pt is using a RW for assistance with ambulation but reports pain in Lt hip region increases to 9/10 with weight bearing.  Pt will benefit from aquatic therapy to address gait and balance deficits, LLE weakness and Lt hip and back pain.    Personal Factors and Comorbidities Comorbidity 2;Time since onset of injury/illness/exacerbation;Past/Current Experience    Comorbidities myasthenia gravis, s/p Lt THA x 2 with Lt periprosthetic femur fracture, L1 & L2 kyphoplasty procedures for vertebral compression fractures    Examination-Activity Limitations Squat;Bend;Stairs;Stand;Transfers;Locomotion Level    Examination-Participation Restrictions Cleaning;Community Activity;Laundry;Meal Prep;Yard Work;Shop    Stability/Clinical Decision Making Evolving/Moderate complexity     Clinical Decision Making Moderate    Rehab Potential Good    PT Frequency 1x / week    PT Duration 8 weeks    PT Treatment/Interventions Aquatic Therapy    PT Next Visit Plan begin aquatic therapy on Monday, Jan 9 at [facility] and Agree with Plan of Care Patient;Family member/caregiver    Family Member Consulted [name]  Patient will benefit from skilled therapeutic intervention in order to improve the following deficits and impairments:  Difficulty walking, Decreased balance, Pain, Decreased strength  Visit Diagnosis: Chronic left-sided low back pain without sciatica - Plan: PT plan of care cert/re-cert  Pain in left hip - Plan: PT plan of care cert/re-cert  Other abnormalities of gait and mobility - Plan: PT plan of care cert/re-cert  Unsteadiness on feet - Plan: PT plan of care cert/re-cert  Muscle weakness (generalized) - Plan: PT plan of care cert/re-cert     Problem List Patient Active Problem List   Diagnosis Date Noted   Fracture, lumbar vertebra, compression ([place]) 03/27/2021   Atrial fibrillation with RVR ([place])    Periprosthetic fracture around internal prosthetic left hip joint ([place]) 03/19/2020   OA (osteoarthritis) of hip 01/28/2020   Closed displaced fracture of left femoral neck ([place]) 01/28/2020   History of colonic polyps 11/12/2019   GERD (gastroesophageal reflux disease) 11/06/2019   Myasthenia gravis with (acute) exacerbation ([place]) 11/06/2019   Acute diarrhea 09/17/2019   Heme positive stool 09/17/2019   Unspecified osteoarthritis, unspecified site    Hyperlipidemia, unspecified    Personal history of other diseases of the musculoskeletal system and connective tissue    Atherosclerotic heart disease of native coronary artery without angina pectoris    Hypertensive heart disease without heart failure    Melena    Plantar fascial fibromatosis    Pleurodynia    Calculus of gallbladder without cholecystitis without  obstruction 09/14/2018   Nausea without vomiting 06/05/2018   Postoperative atrial fibrillation ([place]) 11/09/2013   S/P CABG x 4 10/19/2013   Coronary atherosclerosis of native coronary artery 10/18/2013   Essential hypertension, benign 09/18/2013   Family history of hypertrophic cardiomyopathy 09/18/2013   Mixed hyperlipidemia     Chayden Garrelts Suzanne, PT 08/19/2021, 8:58 PM  [facility] [address] [place], [place], [address] Phone: [phone]   Fax:  [phone]  Name: Brandon [name] MRN: [id number] Date of Birth: Apr 02, 1946

## 2021-08-24 ENCOUNTER — Other Ambulatory Visit: Payer: Self-pay

## 2021-08-24 ENCOUNTER — Ambulatory Visit: Payer: Medicare Other | Admitting: Physical Therapy

## 2021-08-24 DIAGNOSIS — R2681 Unsteadiness on feet: Secondary | ICD-10-CM

## 2021-08-24 DIAGNOSIS — G8929 Other chronic pain: Secondary | ICD-10-CM | POA: Diagnosis not present

## 2021-08-24 DIAGNOSIS — M545 Low back pain, unspecified: Secondary | ICD-10-CM

## 2021-08-24 DIAGNOSIS — M25552 Pain in left hip: Secondary | ICD-10-CM | POA: Diagnosis not present

## 2021-08-24 DIAGNOSIS — R2689 Other abnormalities of gait and mobility: Secondary | ICD-10-CM | POA: Diagnosis not present

## 2021-08-24 DIAGNOSIS — M6281 Muscle weakness (generalized): Secondary | ICD-10-CM

## 2021-08-25 NOTE — Therapy (Signed)
[facility] [address] [place], [place], [address] Phone: [phone]   Fax:  [phone]  Physical Therapy Treatment  Patient Details  Name: Brandon [name] MRN: [id number] Date of Birth: 12-10-1945 Referring Provider (PT): Elwin Sleight, DO   Encounter Date: 08/24/2021   PT End of Session - 08/25/21 1944     Visit Number 2    Number of Visits 9    Date for PT Re-Evaluation 10/16/21    Authorization Type Medicare    Authorization Time Period 08-19-21 - 10-16-21    PT Start Time 1315    PT Stop Time 1405    PT Time Calculation (min) 50 min    Equipment Utilized During Treatment Other (comment)   pool noodle, bar bells   Activity Tolerance Patient tolerated treatment well    Behavior During Therapy [facility] for tasks assessed/performed             Past Medical History:  Diagnosis Date   Arthritis    Cancer ([place])    Cataract    Coronary atherosclerosis of native coronary artery    Multivessel status post CABG March 2015   DDD (degenerative disc disease), lumbar    Dr. 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[place])    Plantar fascial fibromatosis    Pleurodynia    Postoperative atrial fibrillation [facility])    August 2021    Past Surgical History:  Procedure Laterality Date   AMPUTATION Right 03/27/2014   Procedure: DEBRIDEMENT AND CLOSURE RIGHT INDEX FINGER;  Surgeon: [name], MD;  Location: [facility];  Service: Orthopedics;  Laterality: Right;   BACK SURGERY     BICEPT TENODESIS  11/26/2011   Procedure: BICEPT TENODESIS;  Surgeon: Augustin Schooling, MD;  Location: [facility];  Service: Orthopedics;  Laterality: Left;   BIOPSY  08/03/2018    Procedure: BIOPSY;  Surgeon: Rogene Houston, MD;  [facility];  Service: Endoscopy;;  antral   Cataract surgery Left    CHOLECYSTECTOMY N/A 09/22/2018   Procedure: LAPAROSCOPIC CHOLECYSTECTOMY;  Surgeon: Virl Cagey, MD;  Location: [facility];  Service: General;  Laterality: N/A;   COLONOSCOPY     COLONOSCOPY N/A 12/26/2014   Procedure: COLONOSCOPY;  Surgeon: Rogene Houston, MD;  [facility];  Service: Endoscopy;  Laterality: N/A;  730   CORONARY ARTERY BYPASS GRAFT N/A 10/19/2013   Procedure: CORONARY ARTERY BYPASS GRAFTING (CABG);  Surgeon: Rexene Alberts, MD;  Location: [facility];  Service: Open Heart Surgery;  Laterality: N/A;  CABG times four using left internal mammary artery and left leg saphenous vein, incision made on right leg but no vein removed   CYSTOSCOPY WITH RETROGRADE PYELOGRAM, URETEROSCOPY AND STENT PLACEMENT Left 08/24/2016   Procedure: CYSTOSCOPY WITH LEFT RETROGRADE PYELOGRAM, LEFT URETEROSCOPY, BASKET EXTRACTION LEFT URETERAL STONE;  Surgeon: Irine Seal, MD;  Location: [facility];  Service: Urology;  Laterality: Left;   ESOPHAGOGASTRODUODENOSCOPY N/A 08/03/2018   Procedure: ESOPHAGOGASTRODUODENOSCOPY (EGD);  Surgeon: Rogene Houston, MD;  [facility];  Service: Endoscopy;  Laterality: N/A;  2:55   EXTRACORPOREAL  SHOCK WAVE LITHOTRIPSY Right 06/18/2019   Procedure: EXTRACORPOREAL SHOCK WAVE LITHOTRIPSY (ESWL);  Surgeon: Lucas Mallow, MD;  Location[facility];  Service: Urology;  Laterality: Right;   INTRAOPERATIVE TRANSESOPHAGEAL ECHOCARDIOGRAM N/A 10/19/2013   Procedure: INTRAOPERATIVE TRANSESOPHAGEAL ECHOCARDIOGRAM;  Surgeon: Rexene Alberts, MD;  Location: [facility];  Service: Open Heart Surgery;  Laterality: N/A;   JOINT REPLACEMENT     KYPHOPLASTY N/A 03/27/2021   Procedure: Lumbar One and Lumbar Two Kyphoplasty;  Surgeon: Erline Levine, MD;  Location: [facility];  Service: Neurosurgery;  Laterality: N/A;   LEFT HEART CATHETERIZATION WITH  CORONARY ANGIOGRAM N/A 10/09/2013   Procedure: LEFT HEART CATHETERIZATION WITH CORONARY ANGIOGRAM;  Surgeon: Burnell Blanks, MD;  Location: [facility] C[facility] LAB;  Service: Cardiovascular;  Laterality: N/A;   Left shoulder rotator cuff repair     LITHOTRIPSY     ORIF PERIPROSTHETIC FRACTURE Left 03/19/2020   Procedure: OPEN REDUCTION INTERNAL FIXATION (ORIF) PERIPROSTHETIC FRACTURE LEFT FEMUR WITH FEMORAL REVISION;  Surgeon: Gaynelle Arabian, MD;  [facility];  Service: Orthopedics;  Laterality: Left;   SKIN CANCER EXCISION Left 01/2021   TOTAL HIP ARTHROPLASTY Left 01/28/2020   Procedure: TOTAL HIP ARTHROPLASTY ANTERIOR APPROACH;  Surgeon: Gaynelle Arabian, MD;  [facility];  Service: Orthopedics;  Laterality: Left;  154min   TOTAL KNEE ARTHROPLASTY Left    traumaticvamputation of finger  2015   VASECTOMY      There were no vitals filed for this visit.   Subjective Assessment - 08/25/21 1941     Subjective Pt presents for initial aquatic therapy session at [facility]; pt states his left hip started hurting at end of last week, after initial eval last Wed. - states he had to to take a pain pill early this morning    Patient is accompained by: Family member   [name]   Pertinent History myasthenia gravis (dx'd Jan. 2021); OA of hip, closed displaced fracture Lt femoral neck, periprosthetic fracture around internal prosthetic Lt hip joint, fracture lumbar vertebra, Lt THA 01-28-20, kyphoplasty 03-27-21, ORIF periprosthetic fracture 03-19-20; lumbar radiculopathy    Patient Stated Goals reduce pain and increase strength in legs    Currently in Pain? Yes    Pain Score 8     Pain Location Hip    Pain Orientation Left    Pain Descriptors / Indicators Aching;Discomfort;Dull;Grimacing    Pain Type Chronic pain    Pain Onset More than a month ago    Pain Frequency Intermittent             Aquatic therapy at [facility] - pool temp. 92 degrees  Patient seen for aquatic  therapy today.  Treatment took place in water 3.5-4.8 feet deep depending upon activity.  Pt entered and exited the pool via step negotiation with use of hand rails with SBA.  Pt performed hamstring/heel cord stretch (runner's stretch) 1 rep each LE 30 sec hold  Pt performed water walking forwards, backwards and sideways 18' x 4 reps each with UE support on large yellow noodle  Pt performed squats x 10 reps with bil. UE support on pool edge;  unilateral squats 10 reps each with bil. UE support  Standing bil. heel raises 10 reps with UE support on pool edge  Standing hip abduction (small range due to c/o incr. Lt hip pain with larger ROM); pt performed hip flexion, extension and marching 10 reps each leg with UE support   Pt performed supine hip abduction/adduction 2 sets 10  reps; attempted to perform bicycling LE's but pt reported pain in Lt hip with this exercise  Pt requires buoyancy of water for support with standing balance and with gait training without UE support; buoyancy of water also needed for joint unloading for pain reduction with weight bearing; buoyancy also provides spinal decompression for reduced back pain in standing;  viscosity of water needed for resistance for strengthening bil. LE's:  water current provides perturbations for challenges with balance that cannot be safely performed on land                            PT Short Term Goals - 08/25/21 2043       PT SHORT TERM GOAL #1   Title Initiate aquatic therapy at [facility].; pt will amb. from car to pool area with RW with supervision with c/o hip pain </= 8/10 after amb. this distance.    Time 4    Period Weeks    Status New    Target Date 10/16/21      PT SHORT TERM GOAL #2   Title Pt will report decreased Lt hip pain to </= 7/10 with weight bearing.    Baseline 9/10 reported on 08-19-21    Time 4    Period Weeks    Status New    Target Date 09/18/21      PT SHORT TERM  GOAL #3   Title Pt will perform sit to stand from bench in pool with 1 UE support and will stabilize independently upon initial standing to demo increased strength in bil LE's.    Time 4    Period Weeks    Status New    Target Date 09/18/21      PT SHORT TERM GOAL #4   Title Pt will amb. 14' in pool with SBA without UE support.    Time 4    Period Weeks    Status New    Target Date 09/18/21               PT Long Term Goals - 08/25/21 2043       PT LONG TERM GOAL #1   Title Independent in aquatic HEP to be continued at facility of pt's choice upon discharge from PT.    Time 8    Period Weeks    Status New    Target Date 10/16/21      PT LONG TERM GOAL #2   Title Pt will report reduced back/hip pain to </= 5/10 with weight bearing.    Baseline 9/10 reported on 08-19-21    Time 8    Period Weeks    Status New    Target Date 10/16/21      PT LONG TERM GOAL #3   Title Pt will amb. 144' in pool without UE support with c/o pain </= 6/10 at end of this amb. distance.    Time 8    Period Weeks    Status New    Target Date 10/16/21      PT LONG TERM GOAL #4   Title Improve TUG score from 21.75 secs to </= 15 secs with RW for reduced fall risk.    Baseline 21.75 secs with RW on 08-19-21    Time 8    Period Weeks    Status New    Target Date 10/16/21  Plan - 08/25/21 2000     Clinical Impression Statement Skilled aquatic PT session focused on water walking, balance training and LE strengthening.  Pt initially needed bil. UE support on large pool noodle for assist with balance with ambulation but able to amb. 18' across pool near end of session without UE support in 4.5" water depth.  Pt reported incr. Lt hip pain with active hip abduction in standing; pt was instructed to perform wtih smaller ROM and pain was decreased.  Pt also reported increased Lt hip pain with bicycling LLE in supine position.  Cont with POC.    Personal Factors and  Comorbidities Comorbidity 2;Time since onset of injury/illness/exacerbation;Past/Current Experience    Comorbidities myasthenia gravis, s/p Lt THA x 2 with Lt periprosthetic femur fracture, L1 & L2 kyphoplasty procedures for vertebral compression fractures    Examination-Activity Limitations Squat;Bend;Stairs;Stand;Transfers;Locomotion Level    Examination-Participation Restrictions Cleaning;Community Activity;Laundry;Meal Prep;Yard Work;Shop    Stability/Clinical Decision Making Evolving/Moderate complexity    Rehab Potential Good    PT Frequency 1x / week    PT Duration 8 weeks    PT Treatment/Interventions Aquatic Therapy    PT Next Visit Plan begin aquatic therapy on Monday, Jan 9 at [facility] and Agree with Plan of Care Patient;Family member/caregiver    Family Member Consulted Brandon [name]             Patient will benefit from skilled therapeutic intervention in order to improve the following deficits and impairments:  Difficulty walking, Decreased balance, Pain, Decreased strength  Visit Diagnosis: Chronic left-sided low back pain without sciatica  Other abnormalities of gait and mobility  Unsteadiness on feet  Muscle weakness (generalized)     Problem List Patient Active Problem List   Diagnosis Date Noted   Fracture, lumbar vertebra, compression ([place]) 03/27/2021   Atrial fibrillation with RVR ([place])    Periprosthetic fracture around internal prosthetic left hip joint ([place]) 03/19/2020   OA (osteoarthritis) of hip 01/28/2020   Closed displaced fracture of left femoral neck ([place]) 01/28/2020   History of colonic polyps 11/12/2019   GERD (gastroesophageal reflux disease) 11/06/2019   Myasthenia gravis with (acute) exacerbation ([place]) 11/06/2019   Acute diarrhea 09/17/2019   Heme positive stool 09/17/2019   Unspecified osteoarthritis, unspecified site    Hyperlipidemia, unspecified    Personal history of other diseases of the musculoskeletal  system and connective tissue    Atherosclerotic heart disease of native coronary artery without angina pectoris    Hypertensive heart disease without heart failure    Melena    Plantar fascial fibromatosis    Pleurodynia    Calculus of gallbladder without cholecystitis without obstruction 09/14/2018   Nausea without vomiting 06/05/2018   Postoperative atrial fibrillation ([place]) 11/09/2013   S/P CABG x 4 10/19/2013   Coronary atherosclerosis of native coronary artery 10/18/2013   Essential hypertension, benign 09/18/2013   Family history of hypertrophic cardiomyopathy 09/18/2013   Mixed hyperlipidemia     Alda Lea, PT 08/25/2021, 8:47 PM  [facility] [address] [place], [place], [address] Phone: [phone]   Fax:  [phone]  Name: Brandon [name] MRN: [id number] Date of Birth: 1945/11/05

## 2021-08-26 DIAGNOSIS — M5416 Radiculopathy, lumbar region: Secondary | ICD-10-CM | POA: Diagnosis not present

## 2021-08-31 ENCOUNTER — Ambulatory Visit: Payer: Medicare Other | Admitting: Physical Therapy

## 2021-08-31 ENCOUNTER — Other Ambulatory Visit: Payer: Self-pay

## 2021-08-31 DIAGNOSIS — R2689 Other abnormalities of gait and mobility: Secondary | ICD-10-CM

## 2021-08-31 DIAGNOSIS — M545 Low back pain, unspecified: Secondary | ICD-10-CM | POA: Diagnosis not present

## 2021-08-31 DIAGNOSIS — G8929 Other chronic pain: Secondary | ICD-10-CM | POA: Diagnosis not present

## 2021-08-31 DIAGNOSIS — M6281 Muscle weakness (generalized): Secondary | ICD-10-CM | POA: Diagnosis not present

## 2021-08-31 DIAGNOSIS — R2681 Unsteadiness on feet: Secondary | ICD-10-CM | POA: Diagnosis not present

## 2021-08-31 DIAGNOSIS — M25552 Pain in left hip: Secondary | ICD-10-CM

## 2021-09-01 ENCOUNTER — Encounter: Payer: Self-pay | Admitting: Physical Therapy

## 2021-09-01 ENCOUNTER — Ambulatory Visit (INDEPENDENT_AMBULATORY_CARE_PROVIDER_SITE_OTHER): Payer: Medicare Other | Admitting: Diagnostic Neuroimaging

## 2021-09-01 ENCOUNTER — Encounter: Payer: Self-pay | Admitting: Diagnostic Neuroimaging

## 2021-09-01 VITALS — BP 154/87 | HR 68 | Ht 73.0 in | Wt 189.8 lb

## 2021-09-01 DIAGNOSIS — G7000 Myasthenia gravis without (acute) exacerbation: Secondary | ICD-10-CM

## 2021-09-01 MED ORDER — PYRIDOSTIGMINE BROMIDE 60 MG PO TABS: 30.0000 mg | ORAL_TABLET | Freq: Three times a day (TID) | ORAL | 4 refills | Status: DC

## 2021-09-01 NOTE — Therapy (Signed)
[facility] [address] [place], [place], [address] Phone: [phone]   Fax:  [phone]  Physical Therapy Treatment  Patient Details  Name: Brandon [name] MRN: [id number] Date of Birth: 1945-09-11 Referring Provider (PT): Brandon [name]   Encounter Date: 08/31/2021   PT End of Session - 09/01/21 1915     Visit Number 3    Number of Visits 9    Date for PT Re-Evaluation 10/16/21    Authorization Type Medicare    Authorization Time Period 08-19-21 - 10-16-21    PT Start Time 1315    PT Stop Time 1405    PT Time Calculation (min) 50 min    Equipment Utilized During Treatment Other (comment)   pool noodle, bar bells   Activity Tolerance Patient tolerated treatment well    Behavior During Therapy [facility] for tasks assessed/performed             Past Medical History:  Diagnosis Date   Arthritis    Cancer ([place])    Cataract    Coronary atherosclerosis of native coronary artery    Multivessel status post CABG March 2015   DDD (degenerative disc disease), lumbar    Dr. 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[place])    Plantar fascial fibromatosis    Pleurodynia    Postoperative atrial fibrillation [facility])    August 2021    Past Surgical History:  Procedure Laterality Date   AMPUTATION Right 03/27/2014   Procedure: DEBRIDEMENT AND CLOSURE RIGHT INDEX FINGER;  Surgeon: [name], MD;  Location: [facility];  Service: Orthopedics;  Laterality: Right;   BACK SURGERY     BICEPT TENODESIS  11/26/2011   Procedure: BICEPT TENODESIS;  Surgeon: Brandon Schooling, MD;  Location: [facility];  Service: Orthopedics;  Laterality: Left;   BIOPSY  08/03/2018    Procedure: BIOPSY;  Surgeon: Brandon Houston, MD;  [facility];  Service: Endoscopy;;  antral   Cataract surgery Left    CHOLECYSTECTOMY N/A 09/22/2018   Procedure: LAPAROSCOPIC CHOLECYSTECTOMY;  Surgeon: Brandon Cagey, MD;  Location: [facility];  Service: General;  Laterality: N/A;   COLONOSCOPY     COLONOSCOPY N/A 12/26/2014   Procedure: COLONOSCOPY;  Surgeon: Brandon Houston, MD;  [facility];  Service: Endoscopy;  Laterality: N/A;  730   CORONARY ARTERY BYPASS GRAFT N/A 10/19/2013   Procedure: CORONARY ARTERY BYPASS GRAFTING (CABG);  Surgeon: Brandon Alberts, MD;  Location: [place];  Service: Open Heart Surgery;  Laterality: N/A;  CABG times four using left internal mammary artery and left leg saphenous vein, incision made on right leg but no vein removed   CYSTOSCOPY WITH RETROGRADE PYELOGRAM, URETEROSCOPY AND STENT PLACEMENT Left 08/24/2016   Procedure: CYSTOSCOPY WITH LEFT RETROGRADE PYELOGRAM, LEFT URETEROSCOPY, BASKET EXTRACTION LEFT URETERAL STONE;  Surgeon: Brandon Seal, MD;  Location: [facility];  Service: Urology;  Laterality: Left;   ESOPHAGOGASTRODUODENOSCOPY N/A 08/03/2018   Procedure: ESOPHAGOGASTRODUODENOSCOPY (EGD);  Surgeon: Brandon Houston, MD;  [facility];  Service: Endoscopy;  Laterality: N/A;  2:55   EXTRACORPOREAL  SHOCK WAVE LITHOTRIPSY Right 06/18/2019   Procedure: EXTRACORPOREAL SHOCK WAVE LITHOTRIPSY (ESWL);  Surgeon: Brandon Mallow, MD;  Location: [facility];  Service: Urology;  Laterality: Right;   INTRAOPERATIVE TRANSESOPHAGEAL ECHOCARDIOGRAM N/A 10/19/2013   Procedure: INTRAOPERATIVE TRANSESOPHAGEAL ECHOCARDIOGRAM;  Surgeon: Brandon Alberts, MD;  Location: [place];  Service: Open Heart Surgery;  Laterality: N/A;   JOINT REPLACEMENT     KYPHOPLASTY N/A 03/27/2021   Procedure: Lumbar One and Lumbar Two Kyphoplasty;  Surgeon: Brandon Levine, MD;  Location: [facility] [place];  Service: Neurosurgery;  Laterality: N/A;   LEFT HEART CATHETERIZATION WITH  CORONARY ANGIOGRAM N/A 10/09/2013   Procedure: LEFT HEART CATHETERIZATION WITH CORONARY ANGIOGRAM;  Surgeon: Brandon Blanks, MD;  Location: [facility] C[facility];  Service: Cardiovascular;  Laterality: N/A;   Left shoulder rotator cuff repair     LITHOTRIPSY     ORIF PERIPROSTHETIC FRACTURE Left 03/19/2020   Procedure: OPEN REDUCTION INTERNAL FIXATION (ORIF) PERIPROSTHETIC FRACTURE LEFT FEMUR WITH FEMORAL REVISION;  Surgeon: Brandon Arabian, MD;  Location[facility];  Service: Orthopedics;  Laterality: Left;   SKIN CANCER EXCISION Left 01/2021   TOTAL HIP ARTHROPLASTY Left 01/28/2020   Procedure: TOTAL HIP ARTHROPLASTY ANTERIOR APPROACH;  Surgeon: Brandon Arabian, MD;  [facility];  Service: Orthopedics;  Laterality: Left;  181min   TOTAL KNEE ARTHROPLASTY Left    traumaticvamputation of finger  2015   VASECTOMY      There were no vitals filed for this visit.   Subjective Assessment - 09/01/21 1911     Subjective Pt reports he felt better after the 1st aquatic PT session last week but states his left hip started hurting alot a couple of days ago - pt states he wonders if it has something to [name] with the prosthesis  "the rod" in his left leg; has MD appt the first of Feb.    Patient is accompained by: Family member   [name]   Pertinent History myasthenia gravis (dx'd Jan. 2021); OA of hip, closed displaced fracture Lt femoral neck, periprosthetic fracture around internal prosthetic Lt hip joint, fracture lumbar vertebra, Lt THA 01-28-20, kyphoplasty 03-27-21, ORIF periprosthetic fracture 03-19-20; lumbar radiculopathy    Patient Stated Goals reduce pain and increase strength in legs    Currently in Pain? Yes    Pain Score 8     Pain Location Hip    Pain Orientation Left    Pain Descriptors / Indicators Aching;Discomfort;Dull;Grimacing    Pain Type Chronic pain    Pain Onset More than a month ago    Pain Frequency Intermittent                Aquatic therapy at [facility] - pool  temp. 94 degrees  Patient seen for aquatic therapy today.  Treatment took place in water 3.5-4.8 feet deep depending upon activity.  Pt entered and exited the pool via step negotiation with use of hand rails with SBA.  Pt performed hamstring/heel cord stretch (runner's stretch) 1 rep each LE 30 sec hold  Pt performed water walking forwards, backwards and sideways 18' x 4 reps each with UE support on large yellow noodle  Pt performed squats x 10 reps with bil. UE support on pool edge  Standing bil. heel raises 10 reps with UE support on pool edge  Standing hip abduction (small range due to c/o incr. Lt hip pain with larger ROM); pt performed hip flexion, extension and marching 10 reps each leg with UE support; pt made circles with  LLE clockwise direction 5 reps, then counterclockwise 5 reps with RUE support on pool edge  Pt performed supine hip abduction/adduction 3 sets 10 reps; performed bicycling LE's in supine position 3 sets 10 reps  Pt requires buoyancy of water for support with standing balance and with gait training without UE support; buoyancy of water also needed for joint unloading for pain reduction with weight bearing; buoyancy also provides spinal decompression for reduced back pain in standing;  viscosity of water needed for resistance for strengthening bil. LE's:  water current provides perturbations for challenges with balance that cannot be safely performed on land                               PT Short Term Goals - 09/01/21 1925       PT SHORT TERM GOAL #1   Title Initiate aquatic therapy at [facility].; pt will amb. from car to pool area with RW with supervision with c/o hip pain </= 8/10 after amb. this distance.    Time 4    Period Weeks    Status New    Target Date 09/18/21      PT SHORT TERM GOAL #2   Title Pt will report decreased Lt hip pain to </= 7/10 with weight bearing.    Baseline 9/10 reported on 08-19-21    Time  4    Period Weeks    Status New    Target Date 09/18/21      PT SHORT TERM GOAL #3   Title Pt will perform sit to stand from bench in pool with 1 UE support and will stabilize independently upon initial standing to demo increased strength in bil LE's.    Time 4    Period Weeks    Status New    Target Date 09/18/21      PT SHORT TERM GOAL #4   Title Pt will amb. 8' in pool with SBA without UE support.    Time 4    Period Weeks    Status New    Target Date 09/18/21               PT Long Term Goals - 09/01/21 1925       PT LONG TERM GOAL #1   Title Independent in aquatic HEP to be continued at facility of pt's choice upon discharge from PT.    Time 8    Period Weeks    Status New    Target Date 10/16/21      PT LONG TERM GOAL #2   Title Pt will report reduced back/hip pain to </= 5/10 with weight bearing.    Baseline 9/10 reported on 08-19-21    Time 8    Period Weeks    Status New    Target Date 10/16/21      PT LONG TERM GOAL #3   Title Pt will amb. 144' in pool without UE support with c/o pain </= 6/10 at end of this amb. distance.    Time 8    Period Weeks    Status New    Target Date 10/16/21      PT LONG TERM GOAL #4   Title Improve TUG score from 21.75 secs to </= 15 secs with RW for reduced fall risk.    Baseline 21.75 secs with RW on 08-19-21    Time 8    Period Weeks  Status New    Target Date 10/16/21                   Plan - 09/01/21 1917     Clinical Impression Statement Aquatic therapy session focused on balance and gait training in 4.5" water depth and on LE strengthening.  Pt c/o hip pain with supine hip abduction/adduction but reported no pain with bicycling LE's in supine position.  Pt did report pain in Lt hip with weight bearing exercises in 4.5' water depth.  Water walking without UE support on noodle was not attempted in today's session due to c/o Lt hip pain - noodle was used for increased buoyancy for joint unloading.  Cont  with POC.    Personal Factors and Comorbidities Comorbidity 2;Time since onset of injury/illness/exacerbation;Past/Current Experience    Comorbidities myasthenia gravis, s/p Lt THA x 2 with Lt periprosthetic femur fracture, L1 & L2 kyphoplasty procedures for vertebral compression fractures    Examination-Activity Limitations Squat;Bend;Stairs;Stand;Transfers;Locomotion Level    Examination-Participation Restrictions Cleaning;Community Activity;Laundry;Meal Prep;Yard Work;Shop    Stability/Clinical Decision Making Evolving/Moderate complexity    Rehab Potential Good    PT Frequency 1x / week    PT Duration 8 weeks    PT Treatment/Interventions Aquatic Therapy    PT Next Visit Plan begin aquatic therapy on Monday, Jan 9 at [facility] and Agree with Plan of Care Patient;Family member/caregiver    Family Member Consulted Brandon [name]             Patient will benefit from skilled therapeutic intervention in order to improve the following deficits and impairments:  Difficulty walking, Decreased balance, Pain, Decreased strength  Visit Diagnosis: Pain in left hip  Other abnormalities of gait and mobility  Unsteadiness on feet  Muscle weakness (generalized)     Problem List Patient Active Problem List   Diagnosis Date Noted   Fracture, lumbar vertebra, compression ([place]) 03/27/2021   Atrial fibrillation with RVR ([place])    Periprosthetic fracture around internal prosthetic left hip joint ([place]) 03/19/2020   OA (osteoarthritis) of hip 01/28/2020   Closed displaced fracture of left femoral neck ([place]) 01/28/2020   History of colonic polyps 11/12/2019   GERD (gastroesophageal reflux disease) 11/06/2019   Myasthenia gravis with (acute) exacerbation ([place]) 11/06/2019   Acute diarrhea 09/17/2019   Heme positive stool 09/17/2019   Unspecified osteoarthritis, unspecified site    Hyperlipidemia, unspecified    Personal history of other diseases of the musculoskeletal  system and connective tissue    Atherosclerotic heart disease of native coronary artery without angina pectoris    Hypertensive heart disease without heart failure    Melena    Plantar fascial fibromatosis    Pleurodynia    Calculus of gallbladder without cholecystitis without obstruction 09/14/2018   Nausea without vomiting 06/05/2018   Postoperative atrial fibrillation ([place]) 11/09/2013   S/P CABG x 4 10/19/2013   Coronary atherosclerosis of native coronary artery 10/18/2013   Essential hypertension, benign 09/18/2013   Family history of hypertrophic cardiomyopathy 09/18/2013   Mixed hyperlipidemia     Alda Lea, PT 09/01/2021, 7:28 PM  [facility] [address] [place], [place], [address] Phone: [phone]   Fax:  [phone]  Name: Brandon [name] MRN: [id number] Date of Birth: 1945-09-14

## 2021-09-01 NOTE — Progress Notes (Signed)
[facility]  [name] DOB: [birth year]  REFERRING CLINICIAN: Sharilyn Sites, MD HISTORY FROM: patient and wife  REASON FOR VISIT: follow up    HISTORICAL  CHIEF COMPLAINT:  Chief Complaint  Patient presents with   Myasthenia Gravis    Rm 7, 6 month FU  wife- Brandon [name]  "back, hip pain, doing water therapy which is helping; getting injections which are not helping"     HISTORY OF PRESENT ILLNESS:   UPDATE (09/01/21, VRP): Since last visit, now off prednisone. MG stable. No ptosis or generalized weakness. Still with significant low back pain and hip pain. Had L1-2 kyphoplasty in Aug 2022.   UPDATE (02/18/21, VRP): Since last visit, doing well from MG symptoms. However prednisone side effects (weight gain, irritable) are getting worse. Lumbar spinal stenosis is worsening, and maybe heading towards surgery with Brandon [name].  UPDATE (08/20/20, VRP): Since last visit, doing well with myasthenia gravis --> symptoms are stable. Also seeing Dr. Maureen [name], Dr. Rolena [name], Dr. Nelva [name] for lumbar spinal stenosis. Had more low back pain (starting Aug 2021), few months of loose stools, frequent urination. No saddle anesthesia.  UPDATE (03/18/20, VRP): Since last visit, doing well from MG. Some side effects from prednisone (jittery, bloating). Symptoms are stable. Severity is mild. No alleviating or aggravating factors. Tolerating meds otherwise.   UPDATE (01/08/20, VRP): Since last visit, doing WELL. Symptoms are stable. No alleviating or aggravating factors. Tolerating meds. New left hip stress fracture since Dec 28, 2019. Using walker.   UPDATE (11/14/19, VRP): Since last visit, doing WELL; s/p IVIG in the hospital. Now on prednisone 60mg  daily. Symptoms are improved. No alleviating or aggravating factors. Tolerating meds.    UPDATE (10/31/19, VRP): Since last visit, doing poorly. Now with fatigue, SOB, hand weakness, leg weakness. Mestinon not helping (even up to 90mg  three times a  day).  PRIOR HPI (09/24/19): 76 year old male here for evaluation of myasthenia gravis.  September 10, 2019 patient noticed right eye ptosis, progressing to left eye ptosis.  He was having some stomach pain, diarrhea, gas, and breathing issues around the same time.  [name], had Demorest R antibody testing which was positive and diagnosed with myasthenia gravis.  Around the same time patient was with a GI doctor, diagnosed with possible bacterial infection and treated with antibiotics.  He was also noted to be heme positive stool.  Since that time I symptoms are stable.  Symptoms are worse in the evening compared the morning.  [name].  Denies any shortness of breath.  Denies any speech or swallowing problems.  Eye drooping is severe enough that sometimes it obstructs his vision and he is not able to drive currently.   REVIEW OF SYSTEMS: Full 14 system review of systems performed and negative with exception of: as per HPI.    ALLERGIES: No Known Allergies  HOME MEDICATIONS: Outpatient Medications Prior to Visit  Medication Sig Dispense Refill   allopurinol (ZYLOPRIM) 300 MG tablet Take 150 mg by mouth in the morning.     amLODipine (NORVASC) 5 MG tablet Take 1 tablet (5 mg total) by mouth daily. 30 tablet 1   Ascorbic Acid (VITAMIN C) 1000 MG tablet Take 1,000 mg by mouth in the morning.     aspirin EC 81 MG tablet Take 1 tablet (81 mg total) by mouth daily. Swallow whole. 90 tablet 3   Calcium Carb-Cholecalciferol (CALCIUM 600+D3 PO) Take 1 tablet by mouth  in the morning and at bedtime.     gabapentin (NEURONTIN) 300 MG capsule Take 300 mg by mouth at bedtime.     methocarbamol (ROBAXIN) 500 MG tablet Take 1 tablet (500 mg total) by mouth every 8 (eight) hours as needed for muscle spasms. 40 tablet 1   metoprolol tartrate (LOPRESSOR) 25 MG tablet Take 1/2 (one-half) tablet by mouth twice daily 90 tablet 3   Misc Natural Products (OSTEO BI-FLEX  ADV TRIPLE ST) TABS Take 1 tablet by mouth in the morning.     Multiple Vitamin (MULTIVITAMIN) tablet Take 1 tablet by mouth daily.     nitroGLYCERIN (NITROSTAT) 0.4 MG SL tablet Place 1 tablet (0.4 mg total) under the tongue every 5 (five) minutes as needed. 25 tablet 3   Omega-3 Fatty Acids (FISH OIL) 500 MG CAPS Take 500 mg by mouth in the morning.     oxyCODONE-acetaminophen (PERCOCET/ROXICET) 5-325 MG tablet Take 1 tablet by mouth 4 (four) times daily as needed (PAIN.).     pantoprazole (PROTONIX) 40 MG tablet Take 1 tablet (40 mg total) by mouth daily. 30 tablet 1   pravastatin (PRAVACHOL) 40 MG tablet Take 40 mg by mouth every evening.      tamsulosin (FLOMAX) 0.4 MG CAPS capsule Take 0.4 mg by mouth in the morning.     pyridostigmine (MESTINON) 60 MG tablet Take 0.5 tablets (30 mg total) by mouth 3 (three) times daily. 90 tablet 2   No facility-administered medications prior to visit.    PAST MEDICAL HISTORY: Past Medical History:  Diagnosis Date   Arthritis    [facility]cer [facility])    Cataract    Coronary atherosclerosis of native coronary artery    Multivessel status post CABG March 2015   DDD (degenerative disc disease), lumbar    Dr. Carloyn [name]   Essential hypertension    GERD (gastroesophageal reflux disease)    Hard of hearing    Hemorrhoids    History of gout    History of kidney stones    History of [facility] spotted fever    Hypertensive heart disease without heart failure    Lumbago    Melena    Mixed hyperlipidemia    Ocular myasthenia (HCC)    Plantar fascial fibromatosis    Pleurodynia    Postoperative atrial fibrillation [facility])    August 2021    PAST SURGICAL HISTORY: Past Surgical History:  Procedure Laterality Date   AMPUTATION Right 03/27/2014   Procedure: DEBRIDEMENT AND CLOSURE RIGHT INDEX FINGER;  Surgeon: Brandon Hoff, MD;  Location: [place];  Service: Orthopedics;  Laterality: Right;   BACK SURGERY     BICEPT TENODESIS  11/26/2011   Procedure: BICEPT  TENODESIS;  Surgeon: Brandon Schooling, MD;  Location: [place];  Service: Orthopedics;  Laterality: Left;   BIOPSY  08/03/2018   Procedure: BIOPSY;  Surgeon: Rogene Houston, MD;  [facility];  Service: Endoscopy;;  antral   Cataract surgery Left    CHOLECYSTECTOMY N/A 09/22/2018   Procedure: LAPAROSCOPIC CHOLECYSTECTOMY;  Surgeon: Virl Cagey, MD;  Location: [facility];  Service: General;  Laterality: N/A;   COLONOSCOPY     COLONOSCOPY N/A 12/26/2014   Procedure: COLONOSCOPY;  Surgeon: Rogene Houston, MD;  [facility];  Service: Endoscopy;  Laterality: N/A;  730   CORONARY ARTERY BYPASS GRAFT N/A 10/19/2013   Procedure: CORONARY ARTERY BYPASS GRAFTING (CABG);  Surgeon: Rexene Alberts, MD;  Location: [place];  Service: Open  Heart Surgery;  Laterality: N/A;  CABG times four using left internal mammary artery and left leg saphenous vein, incision made on right leg but no vein removed   CYSTOSCOPY WITH RETROGRADE PYELOGRAM, URETEROSCOPY AND STENT PLACEMENT Left 08/24/2016   Procedure: CYSTOSCOPY WITH LEFT RETROGRADE PYELOGRAM, LEFT URETEROSCOPY, BASKET EXTRACTION LEFT URETERAL STONE;  Surgeon: Irine Seal, MD;  Location[facility];  Service: Urology;  Laterality: Left;   ESOPHAGOGASTRODUODENOSCOPY N/A 08/03/2018   Procedure: ESOPHAGOGASTRODUODENOSCOPY (EGD);  Surgeon: Rogene Houston, MD;  [facility];  Service: Endoscopy;  Laterality: N/A;  2:55   EXTRACORPOREAL SHOCK WAVE LITHOTRIPSY Right 06/18/2019   Procedure: EXTRACORPOREAL SHOCK WAVE LITHOTRIPSY (ESWL);  Surgeon: Lucas Mallow, MD;  [facility];  Service: Urology;  Laterality: Right;   INTRAOPERATIVE TRANSESOPHAGEAL ECHOCARDIOGRAM N/A 10/19/2013   Procedure: INTRAOPERATIVE TRANSESOPHAGEAL ECHOCARDIOGRAM;  Surgeon: Rexene Alberts, MD;  Location: [place];  Service: Open Heart Surgery;  Laterality: N/A;   JOINT REPLACEMENT     KYPHOPLASTY N/A 03/27/2021   Procedure: Lumbar One and Lumbar Two Kyphoplasty;   Surgeon: Erline Levine, MD;  Location: [place];  Service: Neurosurgery;  Laterality: N/A;   LEFT HEART CATHETERIZATION WITH CORONARY ANGIOGRAM N/A 10/09/2013   Procedure: LEFT HEART CATHETERIZATION WITH CORONARY ANGIOGRAM;  Surgeon: Burnell Blanks, MD;  Location: [facility] C[facility];  Service: Cardiovascular;  Laterality: N/A;   Left shoulder rotator cuff repair     LITHOTRIPSY     ORIF PERIPROSTHETIC FRACTURE Left 03/19/2020   Procedure: OPEN REDUCTION INTERNAL FIXATION (ORIF) PERIPROSTHETIC FRACTURE LEFT FEMUR WITH FEMORAL REVISION;  Surgeon: Gaynelle Arabian, MD;  [facility];  Service: Orthopedics;  Laterality: Left;   SKIN CANCER EXCISION Left 01/2021   TOTAL HIP ARTHROPLASTY Left 01/28/2020   Procedure: TOTAL HIP ARTHROPLASTY ANTERIOR APPROACH;  Surgeon: Gaynelle Arabian, MD;  [facility];  Service: Orthopedics;  Laterality: Left;  139min   TOTAL KNEE ARTHROPLASTY Left    traumaticvamputation of finger  2015   VASECTOMY      FAMILY HISTORY: Family History  Problem Relation Age of Onset   Hypertension Sister    Lung cancer Father    Heart failure Mother     SOCIAL HISTORY: Social History   Socioeconomic History   Marital status: Married    Spouse name: Brandon [name]   Number of children: 2   Years of education: Not on file   Highest education level: 10th grade  Occupational History   Occupation: Associate Professor [employer]  Tobacco Use   Smoking status: Former    Packs/day: 2.00    Years: 30.00    Pack years: 60.00    Types: Cigarettes    Start date: 08/16/1958    Quit date: 08/17/1983    Years since quitting: 38.0   Smokeless tobacco: Former    Types: Chew    Quit date: 1985   Tobacco comments:    quit smoking 52yrs ago  Scientific laboratory technician Use: Never used  Substance and Sexual Activity   Alcohol use: Not Currently    Comment: Occassional   Drug use: No   Sexual activity: Not on file  Other Topics Concern   Not on file  Social History Narrative   Lives with wife   Caffeine-  coffee 3- 4 c daily   Social Determinants of Health   Financial Resource Strain: Not on file  Food Insecurity: Not on file  Transportation Needs: Not on file  Physical Activity: Not on file  Stress: Not on  file  Social Connections: Not on file  Intimate Partner Violence: Not on file     PHYSICAL EXAM  GENERAL EXAM/CONSTITUTIONAL: Vitals:  Vitals:   09/01/21 0936  BP: (!) 154/87  Pulse: 68  Weight: 189 lb 12.8 oz (86.1 kg)  Height: 6\' 1"  (1.854 m)   Body mass index is 25.04 kg/m. Wt Readings from Last 10 Encounters:  09/01/21 189 lb 12.8 oz (86.1 kg)  08/10/21 200 lb (90.7 kg)  07/21/21 193 lb 9.6 oz (87.8 kg)  03/27/21 220 lb (99.8 kg)  02/18/21 223 lb (101.2 kg)  01/16/21 217 lb (98.4 kg)  11/11/20 219 lb 11.2 oz (99.7 kg)  08/20/20 214 lb (97.1 kg)  06/27/20 213 lb (96.6 kg)  04/03/20 211 lb 12.8 oz (96.1 kg)   Patient is in no distress; well developed, nourished and groomed; neck is supple MOON FACIES  CARDIOVASCULAR: Examination of carotid arteries is normal; no carotid bruits Regular rate and rhythm, no murmurs Examination of peripheral vascular system by observation and palpation is normal  EYES: Ophthalmoscopic exam of optic discs and posterior segments is normal; no papilledema or hemorrhages No results found.  MUSCULOSKELETAL: Gait, strength, tone, movements noted in Neurologic exam below  NEUROLOGIC: MENTAL STATUS:  No flowsheet data found. awake, alert, oriented to person, place and time recent and remote memory intact normal attention and concentration language fluent, comprehension intact, naming intact fund of knowledge appropriate  CRANIAL NERVE:  2nd - no papilledema on fundoscopic exam 2nd, 3rd, 4th, 6th - pupils equal and reactive to light, visual fields full to confrontation, extraocular muscles intact, no nystagmus 5th - facial sensation symmetric 7th - facial strength symmetric 8th - hearing --> REDUCED 9th - palate elevates  symmetrically, uvula midline 11th - shoulder shrug symmetric 12th - tongue protrusion midline   MOTOR:  normal bulk and tone BUE 5; BLE --> RIGHT HF 4+, LEFT HF 3, LEFT DF 4  SENSORY:  normal and symmetric to light touch, temperature, vibration  COORDINATION:  finger-nose-finger, fine finger movements normal  REFLEXES:  deep tendon reflexes TRACE and symmetric  GAIT/STATION:  narrow based gait; USING WALKER     DIAGNOSTIC DATA (LABS, IMAGING, TESTING) - I reviewed patient records, labs, notes, testing and imaging myself where available.  Lab Results  Component Value Date   WBC 9.0 08/10/2021   HGB 13.5 08/10/2021   HCT 42.4 08/10/2021   MCV 89.8 08/10/2021   PLT 262 08/10/2021      Component Value Date/Time   NA 134 (L) 08/10/2021 2125   NA 138 06/28/2019 0000   K 3.7 08/10/2021 2125   CL 101 08/10/2021 2125   CO2 23 08/10/2021 2125   GLUCOSE 115 (H) 08/10/2021 2125   BUN 12 08/10/2021 2125   BUN 20 06/28/2019 0000   CREATININE 0.78 08/10/2021 2125   CALCIUM 8.9 08/10/2021 2125   PROT 7.1 08/10/2021 2125   ALBUMIN 3.6 08/10/2021 2125   AST 22 08/10/2021 2125   ALT 20 08/10/2021 2125   ALKPHOS 106 08/10/2021 2125   BILITOT 1.2 08/10/2021 2125   GFRNONAA >60 08/10/2021 2125   GFRAA >60 03/19/2020 1218   Lab Results  Component Value Date   CHOL 134 06/28/2019   HDL 49 06/28/2019   LDLCALC 62 06/28/2019   TRIG 157 06/28/2019   CHOLHDL 2.4 12/06/2013   Lab Results  Component Value Date   HGBA1C 6.0 (H) 03/20/2020   No results found for: [id number] Lab Results  Component Value Date  TSH 0.981 03/21/2020    09/17/19 AchR ab - 42 HIGH  10/15/19 CT chest 1. No thymic mass or thymoma. 2.  Aortic Atherosclerosis (ICD10-I70.0). 3. 4.5 mm left lower lobe pulmonary nodule. This is unchanged compared with 04/12/2018. No follow-up needed if patient is low-risk. Non-contrast chest CT can be considered in 12 months if patient is high-risk. This  recommendation follows the consensus statement: Guidelines for Management of Incidental Pulmonary Nodules Detected on CT Images: From the Fleischner Society 2017; Radiology 2017; 810:175-102.  07/10/09 MRI lumbar spine 1.  Moderately severe spinal stenosis at L4-5 due to severe  bilateral facet joint arthritis and a 6 mm spondylolisthesis of L4  on L5.  2.  Diffuse bulging of the uncovered L4-5 disc with a herniation of  disc material into the right neural foramen compressing the right  L4 nerve root under the right pedicle.  3.  Severe right lateral recess stenosis at L4-5.  The right L5  nerve root appears compressed by the posterior element hypertrophy  in the right lateral recess.   03/20/21 MRI lumbar spine 1. Acute or subacute L1 and L2 compression fractures. 2. Slight progression of severe spinal and left lateral recess stenosis at L2-3.   ASSESSMENT AND PLAN  76 y.o. year old male here with new onset ptosis, blurred vision, generalized weakness with positive ACH antibody, consistent with generalized myasthenia gravis.    Dx:  1. Myasthenia gravis without (acute) exacerbation (HCC)      PLAN:  GENERALIZED MYASTHENIA GRAVIS (stable) - continue pyridostigmine 60mg  three times a day; may consider to reduce (30mg  three times a day) - continue vitamin D + calcium supplement  LUMBAR SPINAL STENOSIS - follow up with Dr. Reatha Armour  Meds ordered this encounter  Medications   pyridostigmine (MESTINON) 60 MG tablet    Sig: Take 0.5 tablets (30 mg total) by mouth 3 (three) times daily.    Dispense:  150 tablet    Refill:  4   Return in about 1 year (around 09/01/2022).    Penni Bombard, MD 5/85/2778, 24:23 AM Certified in Neurology, Neurophysiology and Neuroimaging  [facility] [address] [place], [place] [address] [phone]

## 2021-09-02 DIAGNOSIS — N1330 Unspecified hydronephrosis: Secondary | ICD-10-CM | POA: Diagnosis not present

## 2021-09-02 DIAGNOSIS — M47816 Spondylosis without myelopathy or radiculopathy, lumbar region: Secondary | ICD-10-CM | POA: Diagnosis not present

## 2021-09-02 DIAGNOSIS — K5730 Diverticulosis of large intestine without perforation or abscess without bleeding: Secondary | ICD-10-CM | POA: Diagnosis not present

## 2021-09-02 DIAGNOSIS — N132 Hydronephrosis with renal and ureteral calculous obstruction: Secondary | ICD-10-CM | POA: Diagnosis not present

## 2021-09-02 DIAGNOSIS — M19021 Primary osteoarthritis, right elbow: Secondary | ICD-10-CM | POA: Diagnosis not present

## 2021-09-02 DIAGNOSIS — M79645 Pain in left finger(s): Secondary | ICD-10-CM | POA: Diagnosis not present

## 2021-09-02 DIAGNOSIS — N202 Calculus of kidney with calculus of ureter: Secondary | ICD-10-CM | POA: Diagnosis not present

## 2021-09-02 DIAGNOSIS — M545 Low back pain, unspecified: Secondary | ICD-10-CM | POA: Diagnosis not present

## 2021-09-02 DIAGNOSIS — N281 Cyst of kidney, acquired: Secondary | ICD-10-CM | POA: Diagnosis not present

## 2021-09-02 DIAGNOSIS — R310 Gross hematuria: Secondary | ICD-10-CM | POA: Diagnosis not present

## 2021-09-02 DIAGNOSIS — N3000 Acute cystitis without hematuria: Secondary | ICD-10-CM | POA: Diagnosis not present

## 2021-09-02 DIAGNOSIS — M79644 Pain in right finger(s): Secondary | ICD-10-CM | POA: Diagnosis not present

## 2021-09-03 DIAGNOSIS — B351 Tinea unguium: Secondary | ICD-10-CM | POA: Diagnosis not present

## 2021-09-03 DIAGNOSIS — L8990 Pressure ulcer of unspecified site, unspecified stage: Secondary | ICD-10-CM | POA: Diagnosis not present

## 2021-09-03 DIAGNOSIS — Z6827 Body mass index (BMI) 27.0-27.9, adult: Secondary | ICD-10-CM | POA: Diagnosis not present

## 2021-09-03 DIAGNOSIS — M79676 Pain in unspecified toe(s): Secondary | ICD-10-CM | POA: Diagnosis not present

## 2021-09-03 DIAGNOSIS — E663 Overweight: Secondary | ICD-10-CM | POA: Diagnosis not present

## 2021-09-07 ENCOUNTER — Other Ambulatory Visit: Payer: Self-pay

## 2021-09-07 ENCOUNTER — Ambulatory Visit: Payer: Medicare Other | Admitting: Physical Therapy

## 2021-09-07 DIAGNOSIS — M25552 Pain in left hip: Secondary | ICD-10-CM

## 2021-09-07 DIAGNOSIS — G8929 Other chronic pain: Secondary | ICD-10-CM | POA: Diagnosis not present

## 2021-09-07 DIAGNOSIS — R2689 Other abnormalities of gait and mobility: Secondary | ICD-10-CM

## 2021-09-07 DIAGNOSIS — N3000 Acute cystitis without hematuria: Secondary | ICD-10-CM | POA: Diagnosis not present

## 2021-09-07 DIAGNOSIS — M545 Low back pain, unspecified: Secondary | ICD-10-CM | POA: Diagnosis not present

## 2021-09-07 DIAGNOSIS — M6281 Muscle weakness (generalized): Secondary | ICD-10-CM | POA: Diagnosis not present

## 2021-09-07 DIAGNOSIS — R2681 Unsteadiness on feet: Secondary | ICD-10-CM

## 2021-09-08 NOTE — Therapy (Signed)
[facility] [address] [place], [place], [address] Phone: [phone]   Fax:  [phone]  Physical Therapy Treatment  Patient Details  Name: Brandon [name] MRN: [id number] Date of Birth: 04-11-1946 Referring Provider (PT): Brandon Sleight, DO   Encounter Date: 09/07/2021   PT End of Session - 09/08/21 2010     Visit Number 4    Number of Visits 9    Date for PT Re-Evaluation 10/16/21    Authorization Type Medicare    Authorization Time Period 08-19-21 - 10-16-21    PT Start Time 73    PT Stop Time 1400    PT Time Calculation (min) 60 min    Equipment Utilized During Treatment Other (comment)   pool noodle, large bar bell, aquatic cuffs   Activity Tolerance Patient tolerated treatment well    Behavior During Therapy [facility] for tasks assessed/performed             Past Medical History:  Diagnosis Date   Arthritis    Cancer ([place])    Cataract    Coronary atherosclerosis of native coronary artery    Multivessel status post CABG March 2015   DDD (degenerative disc disease), lumbar    Dr. 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[place])    Plantar fascial fibromatosis    Pleurodynia    Postoperative atrial fibrillation [facility])    August 2021    Past Surgical History:  Procedure Laterality Date   AMPUTATION Right 03/27/2014   Procedure: DEBRIDEMENT AND CLOSURE RIGHT INDEX FINGER;  Surgeon: Brandon Hoff, MD;  Location: [facility];  Service: Orthopedics;  Laterality: Right;   BACK SURGERY     BICEPT TENODESIS  11/26/2011   Procedure: BICEPT TENODESIS;  Surgeon: Brandon Schooling, MD;  Location: [facility];  Service: Orthopedics;  Laterality: Left;    BIOPSY  08/03/2018   Procedure: BIOPSY;  Surgeon: Brandon Houston, MD;  [facility];  Service: Endoscopy;;  antral   Cataract surgery Left    CHOLECYSTECTOMY N/A 09/22/2018   Procedure: LAPAROSCOPIC CHOLECYSTECTOMY;  Surgeon: Virl Cagey, MD;  Location: [facility];  Service: General;  Laterality: N/A;   COLONOSCOPY     COLONOSCOPY N/A 12/26/2014   Procedure: COLONOSCOPY;  Surgeon: Brandon [place], MD;  [facility];  Service: Endoscopy;  Laterality: N/A;  730   CORONARY ARTERY BYPASS GRAFT N/A 10/19/2013   Procedure: CORONARY ARTERY BYPASS GRAFTING (CABG);  Surgeon: Brandon Alberts, MD;  Location: [facility];  Service: Open Heart Surgery;  Laterality: N/A;  CABG times four using left internal mammary artery and left leg saphenous vein, incision made on right leg but no vein removed   CYSTOSCOPY WITH RETROGRADE PYELOGRAM, URETEROSCOPY AND STENT PLACEMENT Left 08/24/2016   Procedure: CYSTOSCOPY WITH LEFT RETROGRADE PYELOGRAM, LEFT URETEROSCOPY, BASKET EXTRACTION LEFT URETERAL STONE;  Surgeon: Brandon Seal, MD;  Location: [facility];  Service: Urology;  Laterality: Left;   ESOPHAGOGASTRODUODENOSCOPY N/A 08/03/2018   Procedure: ESOPHAGOGASTRODUODENOSCOPY (EGD);  Surgeon: Brandon [place], MD;  [facility];  Service: Endoscopy;  Laterality: N/A;  2:55  EXTRACORPOREAL SHOCK WAVE LITHOTRIPSY Right 06/18/2019   Procedure: EXTRACORPOREAL SHOCK WAVE LITHOTRIPSY (ESWL);  Surgeon: Brandon Mallow, MD;  [facility];  Service: Urology;  Laterality: Right;   INTRAOPERATIVE TRANSESOPHAGEAL ECHOCARDIOGRAM N/A 10/19/2013   Procedure: INTRAOPERATIVE TRANSESOPHAGEAL ECHOCARDIOGRAM;  Surgeon: Brandon Alberts, MD;  Location: [facility];  Service: Open Heart Surgery;  Laterality: N/A;   JOINT REPLACEMENT     KYPHOPLASTY N/A 03/27/2021   Procedure: Lumbar One and Lumbar Two Kyphoplasty;  Surgeon: Brandon Levine, MD;  Location: [facility];  Service: Neurosurgery;  Laterality: N/A;   LEFT HEART  CATHETERIZATION WITH CORONARY ANGIOGRAM N/A 10/09/2013   Procedure: LEFT HEART CATHETERIZATION WITH CORONARY ANGIOGRAM;  Surgeon: Brandon Blanks, MD;  Location: [facility] C[facility];  Service: Cardiovascular;  Laterality: N/A;   Left shoulder rotator cuff repair     LITHOTRIPSY     ORIF PERIPROSTHETIC FRACTURE Left 03/19/2020   Procedure: OPEN REDUCTION INTERNAL FIXATION (ORIF) PERIPROSTHETIC FRACTURE LEFT FEMUR WITH FEMORAL REVISION;  Surgeon: Brandon Arabian, MD;  [facility];  Service: Orthopedics;  Laterality: Left;   SKIN CANCER EXCISION Left 01/2021   TOTAL HIP ARTHROPLASTY Left 01/28/2020   Procedure: TOTAL HIP ARTHROPLASTY ANTERIOR APPROACH;  Surgeon: Brandon Arabian, MD;  [facility];  Service: Orthopedics;  Laterality: Left;  145min   TOTAL KNEE ARTHROPLASTY Left    traumaticvamputation of finger  2015   VASECTOMY      There were no vitals filed for this visit.   Subjective Assessment - 09/08/21 2006     Subjective Pt reports he has kidney stones - may have to have surgery; states he is having alot of pain because of them - started end of last week    Patient is accompained by: Family member   [name]   Pertinent History myasthenia gravis (dx'd Jan. 2021); OA of hip, closed displaced fracture Lt femoral neck, periprosthetic fracture around internal prosthetic Lt hip joint, fracture lumbar vertebra, Lt THA 01-28-20, kyphoplasty 03-27-21, ORIF periprosthetic fracture 03-19-20; lumbar radiculopathy    Patient Stated Goals reduce pain and increase strength in legs    Currently in Pain? Yes    Pain Score 8     Pain Location Back    Pain Orientation Left;Mid    Pain Descriptors / Indicators Aching;Discomfort;Grimacing    Pain Type Acute pain    Pain Onset In the past 7 days    Pain Frequency Constant              Aquatic therapy at [facility]   Patient seen for aquatic therapy today.  Treatment took place in water 3.5-4.8 feet deep depending upon activity.  Pt  entered and exited the pool via step negotiation with use of hand rails with SBA.  Pt performed hamstring/heel cord stretch (runner's stretch) 1 rep each LE 30 sec hold  Pt performed water walking forwards, backwards and sideways 18' x 8 reps each with UE support on large yellow noodle; flotation belt used for 4 reps (18' x 4) for unweighting to decrease Lt back and hip pain  Pt performed squats x 10 reps with bil. UE support on pool edge;  unilateral squats 10 reps each with bil. UE support  Standing bil. heel raises 10 reps with UE support on pool edge  Standing hip abduction (small range due to c/o incr. Lt hip pain with larger ROM); pt performed hip flexion, extension and marching 10 reps each leg with UE support with use of aquatic cuffs for all  hip exercises  Pt performed supine hip abduction/adduction 3 sets 10 reps; bicycling LE's in supine position 3 sets 10 reps  Pt requires buoyancy of water for support with standing balance and with gait training without UE support; buoyancy of water also needed for joint unloading for pain reduction with weight bearing; buoyancy also provides spinal decompression for reduced back pain in standing;  viscosity of water needed for resistance for strengthening bil. LE's:  water current provides perturbations for challenges with balance that cannot be safely performed on land                                                PT Short Term Goals - 09/08/21 2015       PT SHORT TERM GOAL #1   Title Initiate aquatic therapy at [facility].; pt will amb. from car to pool area with RW with supervision with c/o hip pain </= 8/10 after amb. this distance.    Time 4    Period Weeks    Status New    Target Date 09/18/21      PT SHORT TERM GOAL #2   Title Pt will report decreased Lt hip pain to </= 7/10 with weight bearing.    Baseline 9/10 reported on 08-19-21    Time 4    Period Weeks    Status New     Target Date 09/18/21      PT SHORT TERM GOAL #3   Title Pt will perform sit to stand from bench in pool with 1 UE support and will stabilize independently upon initial standing to demo increased strength in bil LE's.    Time 4    Period Weeks    Status New    Target Date 09/18/21      PT SHORT TERM GOAL #4   Title Pt will amb. 50' in pool with SBA without UE support.    Time 4    Period Weeks    Status New    Target Date 09/18/21               PT Long Term Goals - 09/08/21 2016       PT LONG TERM GOAL #1   Title Independent in aquatic HEP to be continued at facility of pt's choice upon discharge from PT.    Time 8    Period Weeks    Status New    Target Date 10/16/21      PT LONG TERM GOAL #2   Title Pt will report reduced back/hip pain to </= 5/10 with weight bearing.    Baseline 9/10 reported on 08-19-21    Time 8    Period Weeks    Status New    Target Date 10/16/21      PT LONG TERM GOAL #3   Title Pt will amb. 144' in pool without UE support with c/o pain </= 6/10 at end of this amb. distance.    Time 8    Period Weeks    Status New    Target Date 10/16/21      PT LONG TERM GOAL #4   Title Improve TUG score from 21.75 secs to </= 15 secs with RW for reduced fall risk.    Baseline 21.75 secs with RW on 08-19-21    Time 8    Period  Weeks    Status New    Target Date 10/16/21                   Plan - 09/08/21 2011     Clinical Impression Statement Pt reporting increased back pain in today's session due to c/o kidney stones; pt also reported Lt hip pain with LLE SLS activities.  Pain was reduced with use of floatation belt for increased unweighting with water walking in 4.5' water depth.  Treatment was modified to omit some of the exercises requiring LLE SLS, specifically Rt hip abdct/adduction in standing with knee flexed.  Cont with POC.    Personal Factors and Comorbidities Comorbidity 2;Time since onset of  injury/illness/exacerbation;Past/Current Experience    Comorbidities myasthenia gravis, s/p Lt THA x 2 with Lt periprosthetic femur fracture, L1 & L2 kyphoplasty procedures for vertebral compression fractures    Examination-Activity Limitations Squat;Bend;Stairs;Stand;Transfers;Locomotion Level    Examination-Participation Restrictions Cleaning;Community Activity;Laundry;Meal Prep;Yard Work;Shop    Stability/Clinical Decision Making Evolving/Moderate complexity    Rehab Potential Good    PT Frequency 1x / week    PT Duration 8 weeks    PT Treatment/Interventions Aquatic Therapy    PT Next Visit Plan begin aquatic therapy on Monday, Jan 9 at [facility] and Agree with Plan of Care Patient;Family member/caregiver    Family Member Consulted Brandon [name]             Patient will benefit from skilled therapeutic intervention in order to improve the following deficits and impairments:  Difficulty walking, Decreased balance, Pain, Decreased strength  Visit Diagnosis: Other abnormalities of gait and mobility  Unsteadiness on feet  Pain in left hip  Muscle weakness (generalized)     Problem List Patient Active Problem List   Diagnosis Date Noted   Fracture, lumbar vertebra, compression ([place]) 03/27/2021   Atrial fibrillation with RVR ([place])    Periprosthetic fracture around internal prosthetic left hip joint ([place]) 03/19/2020   OA (osteoarthritis) of hip 01/28/2020   Closed displaced fracture of left femoral neck ([place]) 01/28/2020   History of colonic polyps 11/12/2019   GERD (gastroesophageal reflux disease) 11/06/2019   Myasthenia gravis with (acute) exacerbation ([place]) 11/06/2019   Acute diarrhea 09/17/2019   Heme positive stool 09/17/2019   Unspecified osteoarthritis, unspecified site    Hyperlipidemia, unspecified    Personal history of other diseases of the musculoskeletal system and connective tissue    Atherosclerotic heart disease of native coronary  artery without angina pectoris    Hypertensive heart disease without heart failure    Melena    Plantar fascial fibromatosis    Pleurodynia    Calculus of gallbladder without cholecystitis without obstruction 09/14/2018   Nausea without vomiting 06/05/2018   Postoperative atrial fibrillation ([place]) 11/09/2013   S/P CABG x 4 10/19/2013   Coronary atherosclerosis of native coronary artery 10/18/2013   Essential hypertension, benign 09/18/2013   Family history of hypertrophic cardiomyopathy 09/18/2013   Mixed hyperlipidemia     Alda Lea, PT 09/08/2021, 8:18 PM  [facility] [address] [place], [place], [address] Phone: [phone]   Fax:  [phone]  Name: Brandon [name] MRN: [id number] Date of Birth: [birth year]

## 2021-09-14 ENCOUNTER — Other Ambulatory Visit: Payer: Self-pay

## 2021-09-14 ENCOUNTER — Ambulatory Visit: Payer: Medicare Other | Admitting: Physical Therapy

## 2021-09-14 DIAGNOSIS — M6281 Muscle weakness (generalized): Secondary | ICD-10-CM | POA: Diagnosis not present

## 2021-09-14 DIAGNOSIS — R2689 Other abnormalities of gait and mobility: Secondary | ICD-10-CM | POA: Diagnosis not present

## 2021-09-14 DIAGNOSIS — R2681 Unsteadiness on feet: Secondary | ICD-10-CM | POA: Diagnosis not present

## 2021-09-14 DIAGNOSIS — M545 Low back pain, unspecified: Secondary | ICD-10-CM | POA: Diagnosis not present

## 2021-09-14 DIAGNOSIS — G8929 Other chronic pain: Secondary | ICD-10-CM | POA: Diagnosis not present

## 2021-09-14 DIAGNOSIS — M25552 Pain in left hip: Secondary | ICD-10-CM | POA: Diagnosis not present

## 2021-09-15 ENCOUNTER — Encounter: Payer: Self-pay | Admitting: Physical Therapy

## 2021-09-15 NOTE — Therapy (Signed)
[facility] [address] [place], [place], [address] Phone: [phone]   Fax:  [phone]  Physical Therapy Treatment  Patient Details  Name: Brandon [name] MRN: [id number] Date of Birth: 24-Jul-1946 Referring Provider (PT): Elwin Sleight, DO   Encounter Date: 09/14/2021   PT End of Session - 09/15/21 1308     Visit Number 5    Number of Visits 9    Date for PT Re-Evaluation 10/16/21    Authorization Type Medicare    Authorization Time Period 08-19-21 - 10-16-21    PT Start Time 1310    PT Stop Time 1400    PT Time Calculation (min) 50 min    Equipment Utilized During Treatment Other (comment)   pool noodle, large bar bell, aquatic cuffs   Activity Tolerance Patient tolerated treatment well    Behavior During Therapy [facility] for tasks assessed/performed             Past Medical History:  Diagnosis Date   Arthritis    Cancer ([place])    Cataract    Coronary atherosclerosis of native coronary artery    Multivessel status post CABG March 2015   DDD (degenerative disc disease), lumbar    Dr. 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;  Service: Orthopedics;  Laterality: Right;   BACK SURGERY     BICEPT TENODESIS  11/26/2011   Procedure: BICEPT TENODESIS;  Surgeon: Augustin Schooling, MD;  Location: [place];  Service: Orthopedics;  Laterality: Left;    BIOPSY  08/03/2018   Procedure: BIOPSY;  Surgeon: Rogene Houston, MD;  [facility];  Service: Endoscopy;;  antral   Cataract surgery Left    CHOLECYSTECTOMY N/A 09/22/2018   Procedure: LAPAROSCOPIC CHOLECYSTECTOMY;  Surgeon: Virl Cagey, MD;  [facility];  Service: General;  Laterality: N/A;   COLONOSCOPY     COLONOSCOPY N/A 12/26/2014   Procedure: COLONOSCOPY;  Surgeon: Rogene Houston, MD;  [facility];  Service: Endoscopy;  Laterality: N/A;  730   CORONARY ARTERY BYPASS GRAFT N/A 10/19/2013   Procedure: CORONARY ARTERY BYPASS GRAFTING (CABG);  Surgeon: Rexene Alberts, MD;  Location: [place];  Service: Open Heart Surgery;  Laterality: N/A;  CABG times four using left internal mammary artery and left leg saphenous vein, incision made on right leg but no vein removed   CYSTOSCOPY WITH RETROGRADE PYELOGRAM, URETEROSCOPY AND STENT PLACEMENT Left 08/24/2016   Procedure: CYSTOSCOPY WITH LEFT RETROGRADE PYELOGRAM, LEFT URETEROSCOPY, BASKET EXTRACTION LEFT URETERAL STONE;  Surgeon: Irine Seal, MD;  [facility];  Service: Urology;  Laterality: Left;   ESOPHAGOGASTRODUODENOSCOPY N/A 08/03/2018   Procedure: ESOPHAGOGASTRODUODENOSCOPY (EGD);  Surgeon: Rogene Houston, MD;  [facility];  Service: Endoscopy;  Laterality: N/A;  2:55  EXTRACORPOREAL SHOCK WAVE LITHOTRIPSY Right 06/18/2019   Procedure: EXTRACORPOREAL SHOCK WAVE LITHOTRIPSY (ESWL);  Surgeon: Lucas Mallow, MD;  [facility];  Service: Urology;  Laterality: Right;   INTRAOPERATIVE TRANSESOPHAGEAL ECHOCARDIOGRAM N/A 10/19/2013   Procedure: INTRAOPERATIVE TRANSESOPHAGEAL ECHOCARDIOGRAM;  Surgeon: Rexene Alberts, MD;  Location: [place];  Service: Open Heart Surgery;  Laterality: N/A;   JOINT REPLACEMENT     KYPHOPLASTY N/A 03/27/2021   Procedure: Lumbar One and Lumbar Two Kyphoplasty;  Surgeon: Erline Levine, MD;  Location: [place];  Service: Neurosurgery;  Laterality: N/A;   LEFT HEART  CATHETERIZATION WITH CORONARY ANGIOGRAM N/A 10/09/2013   Procedure: LEFT HEART CATHETERIZATION WITH CORONARY ANGIOGRAM;  Surgeon: Burnell Blanks, MD;  Location: [facility] CATH [facility];  Service: Cardiovascular;  Laterality: N/A;   Left shoulder rotator cuff repair     LITHOTRIPSY     ORIF PERIPROSTHETIC FRACTURE Left 03/19/2020   Procedure: OPEN REDUCTION INTERNAL FIXATION (ORIF) PERIPROSTHETIC FRACTURE LEFT FEMUR WITH FEMORAL REVISION;  Surgeon: Gaynelle Arabian, MD;  [facility];  Service: Orthopedics;  Laterality: Left;   SKIN CANCER EXCISION Left 01/2021   TOTAL HIP ARTHROPLASTY Left 01/28/2020   Procedure: TOTAL HIP ARTHROPLASTY ANTERIOR APPROACH;  Surgeon: Gaynelle Arabian, MD;  [facility];  Service: Orthopedics;  Laterality: Left;  13min   TOTAL KNEE ARTHROPLASTY Left    traumaticvamputation of finger  2015   VASECTOMY      There were no vitals filed for this visit.   Subjective Assessment - 09/15/21 1306     Subjective Pt states he has appt on Wed. with MD to see what is going to be done about his kidney stones    Patient is accompained by: Family member   wife [name]   Pertinent History myasthenia gravis (dx'd Jan. 2021); OA of hip, closed displaced fracture Lt femoral neck, periprosthetic fracture around internal prosthetic Lt hip joint, fracture lumbar vertebra, Lt THA 01-28-20, kyphoplasty 03-27-21, ORIF periprosthetic fracture 03-19-20; lumbar radiculopathy    Patient Stated Goals reduce pain and increase strength in legs    Currently in Pain? Yes    Pain Score 7     Pain Location Back    Pain Orientation Left;Mid    Pain Descriptors / Indicators Aching;Discomfort;Grimacing    Pain Type Acute pain    Pain Onset 1 to 4 weeks ago    Pain Frequency Constant                  Aquatic therapy at [facility] - pool temp. 84 degrees  Patient seen for aquatic therapy today.  Treatment took place in water 3.5-4.8 feet deep depending upon activity.  Pt entered and  exited the pool via step negotiation with use of hand rails with SBA.  Pt performed water walking forwards, backwards and sideways 18' x 4 reps each with UE support on large yellow noodle  Pt performed squats x 10 reps with bil. UE support on pool edge  Standing bil. heel raises 10 reps with UE support on pool edge  Standing hip abduction (small range due to c/o incr. Lt hip pain with larger ROM); pt performed hip flexion, extension and marching 10 reps each leg with UE support; pt made circles with LLE clockwise direction 5 reps, then counterclockwise 5 reps with RUE support on pool edge  Pt performed supine exercises - bicycling LE's 3 sets 10 reps; hip abdct./adduction 3 sets 10 reps; pt supported in supine with use of noodle - supported by PT  Pt requires buoyancy of water for support with standing balance and with gait training without UE support; buoyancy of water also needed for joint unloading for pain reduction with weight bearing; buoyancy also provides spinal decompression for reduced back pain in standing;  viscosity of water needed for resistance for strengthening bil. LE's:  water current provides perturbations for challenges with balance that cannot be safely performed on land                             PT Short Term Goals - 09/15/21 1316       PT SHORT TERM GOAL #1   Title Initiate aquatic therapy at [facility] pt will amb. from car to pool area with RW with supervision with c/o hip pain </= 8/10 after amb. this distance.    Time 4    Period Weeks    Status New    Target Date 09/18/21      PT SHORT TERM GOAL #2   Title Pt will report decreased Lt hip pain to </= 7/10 with weight bearing.    Baseline 9/10 reported on 08-19-21    Time 4    Period Weeks    Status New    Target Date 09/18/21      PT SHORT TERM GOAL #3   Title Pt will perform sit to stand from bench in pool with 1 UE support and will stabilize independently upon  initial standing to demo increased strength in bil LE's.    Time 4    Period Weeks    Status New    Target Date 09/18/21      PT SHORT TERM GOAL #4   Title Pt will amb. 82' in pool with SBA without UE support.    Time 4    Period Weeks    Status New    Target Date 09/18/21               PT Long Term Goals - 09/15/21 1316       PT LONG TERM GOAL #1   Title Independent in aquatic HEP to be continued at facility of pt's choice upon discharge from PT.    Time 8    Period Weeks    Status New    Target Date 10/16/21      PT LONG TERM GOAL #2   Title Pt will report reduced back/hip pain to </= 5/10 with weight bearing.    Baseline 9/10 reported on 08-19-21    Time 8    Period Weeks    Status New    Target Date 10/16/21      PT LONG TERM GOAL #3   Title Pt will amb. 144' in pool without UE support with c/o pain </= 6/10 at end of this amb. distance.    Time 8    Period Weeks    Status New    Target Date 10/16/21      PT LONG TERM GOAL #4   Title Improve TUG score from 21.75 secs to </= 15 secs with RW for reduced fall risk.    Baseline 21.75 secs with RW on 08-19-21    Time 8    Period Weeks    Status New    Target Date 10/16/21                   Plan - 09/15/21 1310     Clinical Impression Statement  Pt continues to c/o Lt lateral low back pain due to kidney stones; pt has MD appt on Wed. regarding this problem.  Pt able to amb. in 4' water without UE support without LOB.  Pt continues to have difficulty with LLE SLS, partially due to pain in Lt lateral  back and hip with  LLE weightbearing.  Cont with POC.    Personal Factors and Comorbidities Comorbidity 2;Time since onset of injury/illness/exacerbation;Past/Current Experience    Comorbidities myasthenia gravis, s/p Lt THA x 2 with Lt periprosthetic femur fracture, L1 & L2 kyphoplasty procedures for vertebral compression fractures    Examination-Activity Limitations  Squat;Bend;Stairs;Stand;Transfers;Locomotion Level    Examination-Participation Restrictions Cleaning;Community Activity;Laundry;Meal Prep;Yard Work;Shop    Stability/Clinical Decision Making Evolving/Moderate complexity    Rehab Potential Good    PT Frequency 1x / week    PT Duration 8 weeks    PT Treatment/Interventions Aquatic Therapy    PT Next Visit Plan begin aquatic therapy on Monday, Jan 9 at [facility] and Agree with Plan of Care Patient;Family member/caregiver    Family Member Consulted [name]             Patient will benefit from skilled therapeutic intervention in order to improve the following deficits and impairments:  Difficulty walking, Decreased balance, Pain, Decreased strength  Visit Diagnosis: Other abnormalities of gait and mobility  Unsteadiness on feet  Muscle weakness (generalized)     Problem List Patient Active Problem List   Diagnosis Date Noted   Fracture, lumbar vertebra, compression ([place]) 03/27/2021   Atrial fibrillation with RVR ([place])    Periprosthetic fracture around internal prosthetic left hip joint ([place]) 03/19/2020   OA (osteoarthritis) of hip 01/28/2020   Closed displaced fracture of left femoral neck ([place]) 01/28/2020   History of colonic polyps 11/12/2019   GERD (gastroesophageal reflux disease) 11/06/2019   Myasthenia gravis with (acute) exacerbation ([place]) 11/06/2019   Acute diarrhea 09/17/2019   Heme positive stool 09/17/2019   Unspecified osteoarthritis, unspecified site    Hyperlipidemia, unspecified    Personal history of other diseases of the musculoskeletal system and connective tissue    Atherosclerotic heart disease of native coronary artery without angina pectoris    Hypertensive heart disease without heart failure    Melena    Plantar fascial fibromatosis    Pleurodynia    Calculus of gallbladder without cholecystitis without obstruction 09/14/2018   Nausea without vomiting 06/05/2018    Postoperative atrial fibrillation ([place]) 11/09/2013   S/P CABG x 4 10/19/2013   Coronary atherosclerosis of native coronary artery 10/18/2013   Essential hypertension, benign 09/18/2013   Family history of hypertrophic cardiomyopathy 09/18/2013   Mixed hyperlipidemia     Neeta Storey, [place], PT 09/15/2021, 2:21 PM  [facility] [address] [place], [place], [address] Phone: [phone]   Fax:  [phone]  Name: Brandon [name] MRN: [id number] Date of Birth: 1946-04-02

## 2021-09-16 DIAGNOSIS — N390 Urinary tract infection, site not specified: Secondary | ICD-10-CM

## 2021-09-16 DIAGNOSIS — N202 Calculus of kidney with calculus of ureter: Secondary | ICD-10-CM | POA: Diagnosis not present

## 2021-09-16 DIAGNOSIS — N3000 Acute cystitis without hematuria: Secondary | ICD-10-CM | POA: Diagnosis not present

## 2021-09-16 HISTORY — DX: Urinary tract infection, site not specified: N39.0

## 2021-09-21 ENCOUNTER — Ambulatory Visit: Payer: Medicare Other | Attending: Family Medicine | Admitting: Physical Therapy

## 2021-09-21 ENCOUNTER — Other Ambulatory Visit: Payer: Self-pay

## 2021-09-21 DIAGNOSIS — R2681 Unsteadiness on feet: Secondary | ICD-10-CM | POA: Insufficient documentation

## 2021-09-21 DIAGNOSIS — R2689 Other abnormalities of gait and mobility: Secondary | ICD-10-CM | POA: Diagnosis not present

## 2021-09-21 DIAGNOSIS — M5416 Radiculopathy, lumbar region: Secondary | ICD-10-CM | POA: Diagnosis not present

## 2021-09-21 DIAGNOSIS — M6281 Muscle weakness (generalized): Secondary | ICD-10-CM | POA: Diagnosis not present

## 2021-09-22 NOTE — Therapy (Signed)
[facility] [address] [place], [place], [address] Phone: [phone]   Fax:  [phone]  Physical Therapy Treatment  Patient Details  Name: Brandon [name] MRN: [id number] Date of Birth: 1946-08-13 Referring Provider (PT): Elwin Sleight, DO   Encounter Date: 09/21/2021   PT End of Session - 09/22/21 1922     Visit Number 6    Number of Visits 9    Date for PT Re-Evaluation 10/16/21    Authorization Type Medicare    Authorization Time Period 08-19-21 - 10-16-21    PT Start Time 1305    PT Stop Time 1350    PT Time Calculation (min) 45 min    Equipment Utilized During Treatment Other (comment)   pool noodle, small bar bells, aquatic cuffs   Activity Tolerance Patient tolerated treatment well    Behavior During Therapy [facility] for tasks assessed/performed             Past Medical History:  Diagnosis Date   Arthritis    Cancer ([place])    Cataract    Coronary atherosclerosis of native coronary artery    Multivessel status post CABG March 2015   DDD (degenerative disc disease), lumbar    Dr. 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[place])    Plantar fascial fibromatosis    Pleurodynia    Postoperative atrial fibrillation [facility])    August 2021    Past Surgical History:  Procedure Laterality Date   AMPUTATION Right 03/27/2014   Procedure: DEBRIDEMENT AND CLOSURE RIGHT INDEX FINGER;  Surgeon: [name], MD;  Location: [place];  Service: Orthopedics;  Laterality: Right;   BACK SURGERY     BICEPT TENODESIS  11/26/2011   Procedure: BICEPT TENODESIS;  Surgeon: Augustin Schooling, MD;  Location: [place];  Service: Orthopedics;  Laterality: Left;    BIOPSY  08/03/2018   Procedure: BIOPSY;  Surgeon: Rogene Houston, MD;  [facility];  Service: Endoscopy;;  antral   Cataract surgery Left    CHOLECYSTECTOMY N/A 09/22/2018   Procedure: LAPAROSCOPIC CHOLECYSTECTOMY;  Surgeon: Virl Cagey, MD;  [facility];  Service: General;  Laterality: N/A;   COLONOSCOPY     COLONOSCOPY N/A 12/26/2014   Procedure: COLONOSCOPY;  Surgeon: Rogene Houston, MD;  [facility];  Service: Endoscopy;  Laterality: N/A;  730   CORONARY ARTERY BYPASS GRAFT N/A 10/19/2013   Procedure: CORONARY ARTERY BYPASS GRAFTING (CABG);  Surgeon: Rexene Alberts, MD;  Location: [place];  Service: Open Heart Surgery;  Laterality: N/A;  CABG times four using left internal mammary artery and left leg saphenous vein, incision made on right leg but no vein removed   CYSTOSCOPY WITH RETROGRADE PYELOGRAM, URETEROSCOPY AND STENT PLACEMENT Left 08/24/2016   Procedure: CYSTOSCOPY WITH LEFT RETROGRADE PYELOGRAM, LEFT URETEROSCOPY, BASKET EXTRACTION LEFT URETERAL STONE;  Surgeon: Irine Seal, MD;  [facility];  Service: Urology;  Laterality: Left;   ESOPHAGOGASTRODUODENOSCOPY N/A 08/03/2018   Procedure: ESOPHAGOGASTRODUODENOSCOPY (EGD);  Surgeon: Rogene Houston, MD;  [facility];  Service: Endoscopy;  Laterality: N/A;  2:55  EXTRACORPOREAL SHOCK WAVE LITHOTRIPSY Right 06/18/2019   Procedure: EXTRACORPOREAL SHOCK WAVE LITHOTRIPSY (ESWL);  Surgeon: Lucas Mallow, MD;  [facility];  Service: Urology;  Laterality: Right;   INTRAOPERATIVE TRANSESOPHAGEAL ECHOCARDIOGRAM N/A 10/19/2013   Procedure: INTRAOPERATIVE TRANSESOPHAGEAL ECHOCARDIOGRAM;  Surgeon: Rexene Alberts, MD;  Location: [place];  Service: Open Heart Surgery;  Laterality: N/A;   JOINT REPLACEMENT     KYPHOPLASTY N/A 03/27/2021   Procedure: Lumbar One and Lumbar Two Kyphoplasty;  Surgeon: Erline Levine, MD;  Location: [place];  Service: Neurosurgery;  Laterality: N/A;   LEFT HEART  CATHETERIZATION WITH CORONARY ANGIOGRAM N/A 10/09/2013   Procedure: LEFT HEART CATHETERIZATION WITH CORONARY ANGIOGRAM;  Surgeon: Burnell Blanks, MD;  Location: [facility] CATH LAB;  Service: Cardiovascular;  Laterality: N/A;   Left shoulder rotator cuff repair     LITHOTRIPSY     ORIF PERIPROSTHETIC FRACTURE Left 03/19/2020   Procedure: OPEN REDUCTION INTERNAL FIXATION (ORIF) PERIPROSTHETIC FRACTURE LEFT FEMUR WITH FEMORAL REVISION;  Surgeon: Gaynelle Arabian, MD;  [facility];  Service: Orthopedics;  Laterality: Left;   SKIN CANCER EXCISION Left 01/2021   TOTAL HIP ARTHROPLASTY Left 01/28/2020   Procedure: TOTAL HIP ARTHROPLASTY ANTERIOR APPROACH;  Surgeon: Gaynelle Arabian, MD;  [facility];  Service: Orthopedics;  Laterality: Left;  139min   TOTAL KNEE ARTHROPLASTY Left    traumaticvamputation of finger  2015   VASECTOMY      There were no vitals filed for this visit.   Subjective Assessment - 09/22/21 1918     Subjective Pt reports he received injection for his back pain this morning - didn't want to cancel his pool appt because he thought the exercise would help    Patient is accompained by: Family member   wife [name]   Pertinent History myasthenia gravis (dx'd Jan. 2021); OA of hip, closed displaced fracture Lt femoral neck, periprosthetic fracture around internal prosthetic Lt hip joint, fracture lumbar vertebra, Lt THA 01-28-20, kyphoplasty 03-27-21, ORIF periprosthetic fracture 03-19-20; lumbar radiculopathy    Patient Stated Goals reduce pain and increase strength in legs    Currently in Pain? Yes    Pain Score 5     Pain Location Back    Pain Orientation Left;Mid    Pain Descriptors / Indicators Aching;Discomfort;Grimacing    Pain Type Acute pain    Pain Onset 1 to 4 weeks ago    Pain Frequency Constant                Aquatic therapy at [facility] - pool temp 89 degrees  Patient seen for aquatic therapy today.  Treatment took place in water 3.5-4.8 feet deep  depending upon activity.  Pt entered and exited the pool via step negotiation with use of hand rails with SBA.  Pt performed water walking forwards x 6 reps (18' x 2 across width of pool)  backwards and sideways 18' x 2 reps each with UE support on large yellow noodle; sidestepping with squats with use of small bar bells 18' x 2 reps  Pt performed squats x 10 reps with bil. UE support on pool edge;  unilateral squats 10 reps each with bil. UE support  Standing bil. heel raises 10 reps with UE support on pool edge  Standing hip abduction; pt performed hip flexion, extension and marching 10 reps each leg with UE support with use of aquatic cuffs for all hip exercises  Pt performed supine hip abduction/adduction 3 sets 10 reps; bicycling LE's in supine position  3 sets 10 reps  Pt requires buoyancy of water for support with standing balance and with gait training without UE support; buoyancy of water also needed for joint unloading for pain reduction with weight bearing; buoyancy also provides spinal decompression for reduced back pain in standing;  viscosity of water needed for resistance for strengthening bil. LE's:  water current provides perturbations for challenges with balance that cannot be safely performed on land                                    PT Short Term Goals - 09/22/21 1929       PT SHORT TERM GOAL #1   Title Initiate aquatic therapy at [facility].; pt will amb. from car to pool area with RW with supervision with c/o hip pain </= 8/10 after amb. this distance.    Time 4    Period Weeks    Status New    Target Date 09/18/21      PT SHORT TERM GOAL #2   Title Pt will report decreased Lt hip pain to </= 7/10 with weight bearing.    Baseline 9/10 reported on 08-19-21    Time 4    Period Weeks    Status New    Target Date 09/18/21      PT SHORT TERM GOAL #3   Title Pt will perform sit to stand from bench in pool with 1 UE  support and will stabilize independently upon initial standing to demo increased strength in bil LE's.    Time 4    Period Weeks    Status New    Target Date 09/18/21      PT SHORT TERM GOAL #4   Title Pt will amb. 35' in pool with SBA without UE support.    Time 4    Period Weeks    Status New    Target Date 09/18/21               PT Long Term Goals - 09/22/21 1930       PT LONG TERM GOAL #1   Title Independent in aquatic HEP to be continued at facility of pt's choice upon discharge from PT.    Time 8    Period Weeks    Status New    Target Date 10/16/21      PT LONG TERM GOAL #2   Title Pt will report reduced back/hip pain to </= 5/10 with weight bearing.    Baseline 9/10 reported on 08-19-21    Time 8    Period Weeks    Status New    Target Date 10/16/21      PT LONG TERM GOAL #3   Title Pt will amb. 144' in pool without UE support with c/o pain </= 6/10 at end of this amb. distance.    Time 8    Period Weeks    Status New    Target Date 10/16/21      PT LONG TERM GOAL #4   Title Improve TUG score from 21.75 secs to </= 15 secs with RW for reduced fall risk.    Baseline 21.75 secs with RW on 08-19-21    Time 8    Period Weeks    Status New    Target Date 10/16/21  Plan - 09/22/21 1925     Clinical Impression Statement Pt's balance in 4' water depth is improving with pt able to amb. 18' across width of pool without UE support without LOB.  Pt continues to c/o pain with LLE SLS activities, with pt reporting pain in Lt hip, therefore, SLS activities on LLE were omitted from today's aquatic therapy session.  Cont with POC.    Personal Factors and Comorbidities Comorbidity 2;Time since onset of injury/illness/exacerbation;Past/Current Experience    Comorbidities myasthenia gravis, s/p Lt THA x 2 with Lt periprosthetic femur fracture, L1 & L2 kyphoplasty procedures for vertebral compression fractures    Examination-Activity Limitations  Squat;Bend;Stairs;Stand;Transfers;Locomotion Level    Examination-Participation Restrictions Cleaning;Community Activity;Laundry;Meal Prep;Yard Work;Shop    Stability/Clinical Decision Making Evolving/Moderate complexity    Rehab Potential Good    PT Frequency 1x / week    PT Duration 8 weeks    PT Treatment/Interventions Aquatic Therapy    PT Next Visit Plan begin aquatic therapy on Monday, Jan 9 at [facility] and Agree with Plan of Care Patient;Family member/caregiver    Family Member Consulted [name]             Patient will benefit from skilled therapeutic intervention in order to improve the following deficits and impairments:  Difficulty walking, Decreased balance, Pain, Decreased strength  Visit Diagnosis: Other abnormalities of gait and mobility  Unsteadiness on feet  Muscle weakness (generalized)     Problem List Patient Active Problem List   Diagnosis Date Noted   Fracture, lumbar vertebra, compression ([place]) 03/27/2021   Atrial fibrillation with RVR ([place])    Periprosthetic fracture around internal prosthetic left hip joint ([place]) 03/19/2020   OA (osteoarthritis) of hip 01/28/2020   Closed displaced fracture of left femoral neck ([place]) 01/28/2020   History of colonic polyps 11/12/2019   GERD (gastroesophageal reflux disease) 11/06/2019   Myasthenia gravis with (acute) exacerbation ([place]) 11/06/2019   Acute diarrhea 09/17/2019   Heme positive stool 09/17/2019   Unspecified osteoarthritis, unspecified site    Hyperlipidemia, unspecified    Personal history of other diseases of the musculoskeletal system and connective tissue    Atherosclerotic heart disease of native coronary artery without angina pectoris    Hypertensive heart disease without heart failure    Melena    Plantar fascial fibromatosis    Pleurodynia    Calculus of gallbladder without cholecystitis without obstruction 09/14/2018   Nausea without vomiting 06/05/2018    Postoperative atrial fibrillation ([place]) 11/09/2013   S/P CABG x 4 10/19/2013   Coronary atherosclerosis of native coronary artery 10/18/2013   Essential hypertension, benign 09/18/2013   Family history of hypertrophic cardiomyopathy 09/18/2013   Mixed hyperlipidemia     Leng Montesdeoca Suzanne, PT 09/22/2021, 7:31 PM  [facility] [address] [place], [place], [address] Phone: [phone]   Fax:  [phone]  Name: PRYCE FOLTS MRN: [id number] Date of Birth: [birth year]

## 2021-09-24 DIAGNOSIS — I700 Atherosclerosis of aorta: Secondary | ICD-10-CM | POA: Diagnosis not present

## 2021-09-24 DIAGNOSIS — N202 Calculus of kidney with calculus of ureter: Secondary | ICD-10-CM | POA: Diagnosis not present

## 2021-09-24 DIAGNOSIS — N400 Enlarged prostate without lower urinary tract symptoms: Secondary | ICD-10-CM | POA: Diagnosis not present

## 2021-09-24 DIAGNOSIS — R310 Gross hematuria: Secondary | ICD-10-CM | POA: Diagnosis not present

## 2021-09-24 DIAGNOSIS — N132 Hydronephrosis with renal and ureteral calculous obstruction: Secondary | ICD-10-CM | POA: Diagnosis not present

## 2021-09-24 DIAGNOSIS — M1611 Unilateral primary osteoarthritis, right hip: Secondary | ICD-10-CM | POA: Diagnosis not present

## 2021-09-28 ENCOUNTER — Other Ambulatory Visit: Payer: Self-pay

## 2021-09-28 ENCOUNTER — Ambulatory Visit: Payer: Medicare Other | Admitting: Physical Therapy

## 2021-09-28 DIAGNOSIS — R2681 Unsteadiness on feet: Secondary | ICD-10-CM

## 2021-09-28 DIAGNOSIS — R2689 Other abnormalities of gait and mobility: Secondary | ICD-10-CM

## 2021-09-28 DIAGNOSIS — M6281 Muscle weakness (generalized): Secondary | ICD-10-CM | POA: Diagnosis not present

## 2021-09-29 ENCOUNTER — Encounter: Payer: Self-pay | Admitting: Physical Therapy

## 2021-09-29 NOTE — Therapy (Signed)
[facility] [address] [place], [place], [address] Phone: [phone]   Fax:  [phone]  Physical Therapy Treatment  Patient Details  Name: Brandon [name] MRN: [id number] Date of Birth: [birth year] Referring Provider (PT): Elwin Sleight, DO   Encounter Date: 09/28/2021   PT End of Session - 09/29/21 1858     Visit Number 7    Number of Visits 9    Date for PT Re-Evaluation 10/16/21    Authorization Type Medicare    Authorization Time Period 08-19-21 - 10-16-21    PT Start Time 1310    PT Stop Time 1400    PT Time Calculation (min) 50 min    Equipment Utilized During Treatment Other (comment)   pool noodle, small bar bells, aquatic cuffs   Activity Tolerance Patient tolerated treatment well    Behavior During Therapy [facility] for tasks assessed/performed             Past Medical History:  Diagnosis Date   Arthritis    Cancer ([place])    Cataract    Coronary atherosclerosis of native coronary artery    Multivessel status post CABG March 2015   DDD (degenerative disc disease), lumbar    Dr. 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[place])    Plantar fascial fibromatosis    Pleurodynia    Postoperative atrial fibrillation [facility])    August 2021    Past Surgical History:  Procedure Laterality Date   AMPUTATION Right 03/27/2014   Procedure: DEBRIDEMENT AND CLOSURE RIGHT INDEX FINGER;  Surgeon: [name], MD;  Location: [facility];  Service: Orthopedics;  Laterality: Right;   BACK SURGERY     BICEPT TENODESIS  11/26/2011   Procedure: BICEPT TENODESIS;  Surgeon: Augustin Schooling, MD;  Location: [facility];  Service: Orthopedics;  Laterality: Left;    BIOPSY  08/03/2018   Procedure: BIOPSY;  Surgeon: Rogene Houston, MD;  [facility];  Service: Endoscopy;;  antral   Cataract surgery Left    CHOLECYSTECTOMY N/A 09/22/2018   Procedure: LAPAROSCOPIC CHOLECYSTECTOMY;  Surgeon: Virl Cagey, MD;  Location: [facility];  Service: General;  Laterality: N/A;   COLONOSCOPY     COLONOSCOPY N/A 12/26/2014   Procedure: COLONOSCOPY;  Surgeon: Rogene Houston, MD;  [facility];  Service: Endoscopy;  Laterality: N/A;  730   CORONARY ARTERY BYPASS GRAFT N/A 10/19/2013   Procedure: CORONARY ARTERY BYPASS GRAFTING (CABG);  Surgeon: Rexene Alberts, MD;  Location: [place];  Service: Open Heart Surgery;  Laterality: N/A;  CABG times four using left internal mammary artery and left leg saphenous vein, incision made on right leg but no vein removed   CYSTOSCOPY WITH RETROGRADE PYELOGRAM, URETEROSCOPY AND STENT PLACEMENT Left 08/24/2016   Procedure: CYSTOSCOPY WITH LEFT RETROGRADE PYELOGRAM, LEFT URETEROSCOPY, BASKET EXTRACTION LEFT URETERAL STONE;  Surgeon: Irine Seal, MD;  [facility];  Service: Urology;  Laterality: Left;   ESOPHAGOGASTRODUODENOSCOPY N/A 08/03/2018   Procedure: ESOPHAGOGASTRODUODENOSCOPY (EGD);  Surgeon: Rogene Houston, MD;  [facility];  Service: Endoscopy;  Laterality: N/A;  2:55  EXTRACORPOREAL SHOCK WAVE LITHOTRIPSY Right 06/18/2019   Procedure: EXTRACORPOREAL SHOCK WAVE LITHOTRIPSY (ESWL);  Surgeon: Lucas Mallow, MD;  [facility];  Service: Urology;  Laterality: Right;   INTRAOPERATIVE TRANSESOPHAGEAL ECHOCARDIOGRAM N/A 10/19/2013   Procedure: INTRAOPERATIVE TRANSESOPHAGEAL ECHOCARDIOGRAM;  Surgeon: Rexene Alberts, MD;  Location: [facility];  Service: Open Heart Surgery;  Laterality: N/A;   JOINT REPLACEMENT     KYPHOPLASTY N/A 03/27/2021   Procedure: Lumbar One and Lumbar Two Kyphoplasty;  Surgeon: Erline Levine, MD;  Location: [place];  Service: Neurosurgery;  Laterality: N/A;   LEFT HEART  CATHETERIZATION WITH CORONARY ANGIOGRAM N/A 10/09/2013   Procedure: LEFT HEART CATHETERIZATION WITH CORONARY ANGIOGRAM;  Surgeon: Burnell Blanks, MD;  Location: [facility] CATH LAB;  Service: Cardiovascular;  Laterality: N/A;   Left shoulder rotator cuff repair     LITHOTRIPSY     ORIF PERIPROSTHETIC FRACTURE Left 03/19/2020   Procedure: OPEN REDUCTION INTERNAL FIXATION (ORIF) PERIPROSTHETIC FRACTURE LEFT FEMUR WITH FEMORAL REVISION;  Surgeon: Gaynelle Arabian, MD;  [facility];  Service: Orthopedics;  Laterality: Left;   SKIN CANCER EXCISION Left 01/2021   TOTAL HIP ARTHROPLASTY Left 01/28/2020   Procedure: TOTAL HIP ARTHROPLASTY ANTERIOR APPROACH;  Surgeon: Gaynelle Arabian, MD;  [facility];  Service: Orthopedics;  Laterality: Left;  150min   TOTAL KNEE ARTHROPLASTY Left    traumaticvamputation of finger  2015   VASECTOMY      There were no vitals filed for this visit.   Subjective Assessment - 09/29/21 1703     Subjective Pt reports he has appt with kidney doctor on Wed. - states he thinks he will probably have to have surgery for the kidney stones - states "he said he was going to clean out my kidneys"    Patient is accompained by: Family member   [name]   Pertinent History myasthenia gravis (dx'd Jan. 2021); OA of hip, closed displaced fracture Lt femoral neck, periprosthetic fracture around internal prosthetic Lt hip joint, fracture lumbar vertebra, Lt THA 01-28-20, kyphoplasty 03-27-21, ORIF periprosthetic fracture 03-19-20; lumbar radiculopathy    Patient Stated Goals reduce pain and increase strength in legs    Currently in Pain? Yes    Pain Score 5     Pain Location Back    Pain Orientation Left;Mid    Pain Descriptors / Indicators Aching;Discomfort    Pain Type Acute pain    Pain Onset 1 to 4 weeks ago                 Aquatic therapy at [facility] - pool temp. 90 degrees  Patient seen for aquatic therapy today.  Treatment took place in water 3.5-4.8 feet  deep depending upon activity.  Pt entered and exited the pool via step negotiation with use of hand rails with SBA.  Pt performed water walking forwards 18' x 6 reps without UE support on noodle or barbells; backwards and sideways amb. 18' x 2 reps each with UE support on bar bells  Pt performed squats x 10 reps with bil. UE support on pool edge; unilateral squats on each leg 10 reps with UE support on pool edge  Standing bil. heel raises 10 reps with UE support on pool edge  Pt performed standing hip flexion, abduction and extension 10 reps each direction with each leg with use of aquatic cuffs for increased resistance with eccentric contraction   Pt performed supine exercises - bicycling LE's 3 sets 10 reps; hip abdct./adduction 3 sets 10 reps;  pt supported in supine with use of noodle - supported by PT  Pt requires buoyancy of water for support with standing balance and with gait training without UE support; buoyancy of water also needed for joint unloading for pain reduction with weight bearing; buoyancy also provides spinal decompression for reduced back pain in standing;  viscosity of water needed for resistance for strengthening bil. LE's:  water current provides perturbations for challenges with balance that cannot be safely performed on land                             PT Short Term Goals - 09/29/21 1859       PT SHORT TERM GOAL #1   Title Initiate aquatic therapy at [facility].; pt will amb. from car to pool area with RW with supervision with c/o hip pain </= 8/10 after amb. this distance.    Baseline Inconsistently met - pt rates pain 8-9/10 in Lt back and hip areas; pt states that MD said pain may be partially due to kidney stones (diagnosed 2-3 weeks ago) and not completely due to back pain    Time 4    Period Weeks    Status On-going    Target Date 09/18/21      PT SHORT TERM GOAL #2   Title Pt will report decreased Lt hip pain to </=  7/10 with weight bearing.    Baseline 9/10 reported on 08-19-21; pt reports pain varies in intensity - 09-28-21    Time 4    Period Weeks    Status On-going    Target Date 09/18/21      PT SHORT TERM GOAL #3   Title Pt will perform sit to stand from bench in pool with 1 UE support and will stabilize independently upon initial standing to demo increased strength in bil LE's.    Time 4    Period Weeks    Status Achieved    Target Date 09/18/21      PT SHORT TERM GOAL #4   Title Pt will amb. 57' in pool with SBA without UE support.    Time 4    Period Weeks    Status Achieved    Target Date 09/18/21               PT Long Term Goals - 09/29/21 1859       PT LONG TERM GOAL #1   Title Independent in aquatic HEP to be continued at facility of pt's choice upon discharge from PT.    Time 8    Period Weeks    Status New    Target Date 10/16/21      PT LONG TERM GOAL #2   Title Pt will report reduced back/hip pain to </= 5/10 with weight bearing.    Baseline 9/10 reported on 08-19-21    Time 8    Period Weeks    Status New    Target Date 10/16/21      PT LONG TERM GOAL #3   Title Pt will amb. 144' in pool without UE support with c/o pain </= 6/10 at end of this amb. distance.    Time 8    Period Weeks    Status New    Target Date 10/16/21      PT LONG TERM GOAL #4   Title Improve TUG score from 21.75 secs to </= 15 secs with RW for reduced  fall risk.    Baseline 21.75 secs with RW on 08-19-21    Time 8    Period Weeks    Status New    Target Date 10/16/21                   Plan - 09/29/21 1906     Clinical Impression Statement Pt has met STG's #3 & 4:  STG's #1 and 2 are ongoing as not consistently achieved.  Pt has been diagnosed with having kidney stones and states MD told him that some of his pain may be coming from the kidney stones and not from his back.  Pt rates pain 8-9/10 when not in the pool; rates pain 3-5/10 in the pool in approx. 4' water depth.   Pt is progressing with balance and gait in 4' water depth with pt able to amb. without UE support without LOB.   Cont with POC.    Personal Factors and Comorbidities Comorbidity 2;Time since onset of injury/illness/exacerbation;Past/Current Experience    Comorbidities myasthenia gravis, s/p Lt THA x 2 with Lt periprosthetic femur fracture, L1 & L2 kyphoplasty procedures for vertebral compression fractures    Examination-Activity Limitations Squat;Bend;Stairs;Stand;Transfers;Locomotion Level    Examination-Participation Restrictions Cleaning;Community Activity;Laundry;Meal Prep;Yard Work;Shop    Stability/Clinical Decision Making Evolving/Moderate complexity    Rehab Potential Good    PT Frequency 1x / week    PT Duration 8 weeks    PT Treatment/Interventions Aquatic Therapy    PT Next Visit Plan begin aquatic therapy on Monday, Jan 9 at [facility] and Agree with Plan of Care Patient;Family member/caregiver    Family Member Consulted Brandon [name]             Patient will benefit from skilled therapeutic intervention in order to improve the following deficits and impairments:  Difficulty walking, Decreased balance, Pain, Decreased strength  Visit Diagnosis: Other abnormalities of gait and mobility  Unsteadiness on feet  Muscle weakness (generalized)     Problem List Patient Active Problem List   Diagnosis Date Noted   Fracture, lumbar vertebra, compression ([place]) 03/27/2021   Atrial fibrillation with RVR ([place])    Periprosthetic fracture around internal prosthetic left hip joint ([place]) 03/19/2020   OA (osteoarthritis) of hip 01/28/2020   Closed displaced fracture of left femoral neck ([place]) 01/28/2020   History of colonic polyps 11/12/2019   GERD (gastroesophageal reflux disease) 11/06/2019   Myasthenia gravis with (acute) exacerbation ([place]) 11/06/2019   Acute diarrhea 09/17/2019   Heme positive stool 09/17/2019   Unspecified osteoarthritis, unspecified  site    Hyperlipidemia, unspecified    Personal history of other diseases of the musculoskeletal system and connective tissue    Atherosclerotic heart disease of native coronary artery without angina pectoris    Hypertensive heart disease without heart failure    Melena    Plantar fascial fibromatosis    Pleurodynia    Calculus of gallbladder without cholecystitis without obstruction 09/14/2018   Nausea without vomiting 06/05/2018   Postoperative atrial fibrillation ([place]) 11/09/2013   S/P CABG x 4 10/19/2013   Coronary atherosclerosis of native coronary artery 10/18/2013   Essential hypertension, benign 09/18/2013   Family history of hypertrophic cardiomyopathy 09/18/2013   Mixed hyperlipidemia     Alda Lea, PT 09/29/2021, 7:17 PM  [facility] [address] [place], [place], [address] Phone: [phone]   Fax:  [phone]  Name: GENNIE DIB MRN: [id number] Date of Birth:  05/08/1946 ° ° ° °

## 2021-10-01 ENCOUNTER — Other Ambulatory Visit: Payer: Self-pay | Admitting: Urology

## 2021-10-01 ENCOUNTER — Other Ambulatory Visit: Payer: Self-pay

## 2021-10-01 ENCOUNTER — Encounter (HOSPITAL_BASED_OUTPATIENT_CLINIC_OR_DEPARTMENT_OTHER): Payer: Self-pay | Admitting: Urology

## 2021-10-01 ENCOUNTER — Telehealth: Payer: Self-pay | Admitting: Cardiology

## 2021-10-01 DIAGNOSIS — Z9989 Dependence on other enabling machines and devices: Secondary | ICD-10-CM

## 2021-10-01 DIAGNOSIS — M545 Low back pain, unspecified: Secondary | ICD-10-CM

## 2021-10-01 HISTORY — DX: Low back pain, unspecified: M54.50

## 2021-10-01 HISTORY — DX: Dependence on other enabling machines and devices: Z99.89

## 2021-10-01 NOTE — Telephone Encounter (Signed)
° °  Pre-operative Risk Assessment    Patient Name: Brandon [name]  DOB: 05-Apr-1946 MRN: [id number]      Request for Surgical Clearance    Procedure:   Kidney stone removable   Date of Surgery:  Clearance 10/06/21                                 Surgeon:  Dr. Charna Archer Surgeon's Group or Practice Name:  [facility]  Phone number:  [phone] Fax number:  [phone]   Type of Clearance Requested:   - Medical  Asprin need to be held   Type of Anesthesia:  General    Additional requests/questions:      SignedMilbert Coulter   10/01/2021, 1:48 PM  .

## 2021-10-01 NOTE — Telephone Encounter (Signed)
Dr. Domenic Polite We are asked for medical clearance and ASA hold in this patient. In your recent office note, you stated that if he needed spine surgery / general anesthesia, he would need a lexiscan myoview.  We have been asked for clearance for "kidney stone removal" under general anesthesia scheduled on 10/06/21. Do you recommend stress test prior to procedure?  We are also asked for ASA hold.

## 2021-10-01 NOTE — Progress Notes (Addendum)
Spoke w/ via phone for pre-op interview---pt wife diane per pt request due to pt hoh Lab needs dos----   I stat           Lab results------see below COVID test -----patient wife diane states asymptomatic no test needed Arrive at -------745 am 10-06-2020 NPO after MN NO Solid Food.  Clear liquids from MN until---645 am Med rec completed Medications to take morning of surgery -----Amlodipine, Pantaprazole, Lopressor, Pyridostigmine Diabetic medication -----n/a Patient instructed no nail polish to be worn day of surgery Patient instructed to bring photo id and insurance card day of surgery Patient aware to have Driver (ride ) / caregiver   wife diane will stay  for 24 hours after surgery  Patient Special Instructions -----none  Pre-Op special Istructions -----none Patient verbalized understanding of instructions that were given at this phone interview.   Cagb x 4 2015. Patient denies s any Cardiac s & s , has not used nitro in many years, shortness of breath, chest pain, fever, cough, pt states he can climb flight of stairs  and do household chores without issues except low back back and some leg weakness he uses rolling walker or cane prn pt is currently taking physical therapy on mondays to strengthen legs.Marland Kitchen at this phone interview.    Ekg 01-16-2021 chart/epic 03-20-2020 echo [facility] cardiology dr Domenic Polite 07-21-2021 epic Pt stopped 81 mg aspirin today per dr Jeffie Pollock instructions  Note in epic from dr s Domenic Polite  dated 10-01-2021 pt needs lexiscan prior to 10-06-2021 surgery and can have 10-06-2021 surgery of lexiscan normal and can hold 81 mg aspirin x 7 days before 10-06-2021 surgery. Left voice mail message with Darrel Reach at [facility] urology pt needs lexiscan before 10-06-2021 surgery per cardiology dr Domenic Polite.  Addendum: lexiscan 10-05-2021 result final chart/ epic

## 2021-10-01 NOTE — Telephone Encounter (Signed)
Per Dr. Domenic Polite: Yes, I would suggest setting him up for a Lexiscan Myoview to clarify ischemic burden since his CABG in 2015.  Unless high risk features are found, he should be able to proceed with planned surgery.  Also should be able to hold aspirin 7 days prior.   Callback - can you please notify the requesting  office and help set him up for a lexiscan?

## 2021-10-02 ENCOUNTER — Other Ambulatory Visit: Payer: Self-pay

## 2021-10-02 ENCOUNTER — Telehealth: Payer: Self-pay | Admitting: Cardiology

## 2021-10-02 DIAGNOSIS — Z951 Presence of aortocoronary bypass graft: Secondary | ICD-10-CM

## 2021-10-02 NOTE — Telephone Encounter (Signed)
Patient's wife is stating the Dr. Domenic Polite was wanting to send her husband for a Stress test prior to his surgery.  I did not see an order for it.

## 2021-10-02 NOTE — Telephone Encounter (Signed)
Pt has appt for [facility] 10/05/21. I will update the requesting office,

## 2021-10-02 NOTE — Telephone Encounter (Signed)
I will send a message to Dr. Myles Gip nurse to help set up [place], per Dr. Domenic Polite. I will update the surgeon's office that the pt is going to need further cardiac testing before he can be cleared.

## 2021-10-02 NOTE — Telephone Encounter (Signed)
Per Dr. Jeffie Pollock I called and left a vm for his surgery scheduler Pam ext [phone] that the pt is needing further cardiac testing. Pt is scheduled for lexiscan 10/05/21 for pre op clearance.

## 2021-10-02 NOTE — Telephone Encounter (Signed)
Stress test orders entered. Will send to admin to schedule.

## 2021-10-05 ENCOUNTER — Ambulatory Visit (HOSPITAL_COMMUNITY)
Admission: RE | Admit: 2021-10-05 | Discharge: 2021-10-05 | Disposition: A | Discharge status: Home / Self Care | Payer: Medicare Other | Source: Ambulatory Visit | Attending: Cardiology | Admitting: Cardiology

## 2021-10-05 ENCOUNTER — Ambulatory Visit: Payer: Medicare Other | Admitting: Physical Therapy

## 2021-10-05 ENCOUNTER — Encounter (HOSPITAL_COMMUNITY)
Admission: RE | Admit: 2021-10-05 | Discharge: 2021-10-05 | Disposition: A | Discharge status: Home / Self Care | Payer: Medicare Other | Source: Ambulatory Visit | Attending: Cardiology | Admitting: Cardiology

## 2021-10-05 ENCOUNTER — Other Ambulatory Visit: Payer: Self-pay

## 2021-10-05 DIAGNOSIS — Z951 Presence of aortocoronary bypass graft: Secondary | ICD-10-CM

## 2021-10-05 DIAGNOSIS — Z01810 Encounter for preprocedural cardiovascular examination: Secondary | ICD-10-CM

## 2021-10-05 LAB — NM MYOCAR MULTI W/SPECT W/WALL MOTION / EF
LV dias vol: 108 mL (ref 62–150)
LV sys vol: 46 mL
Nuc Stress EF: 57 %
Peak HR: 82 {beats}/min
RATE: 0.3
Rest HR: 62 {beats}/min
Rest Nuclear Isotope Dose: 8.4 mCi
SDS: 0
SRS: 0
SSS: 0
ST Depression (mm): 0 mm
Stress Nuclear Isotope Dose: 28 mCi
TID: 0.9

## 2021-10-05 MED ORDER — TECHNETIUM TC 99M TETROFOSMIN IV KIT
10.0000 | PACK | Freq: Once | INTRAVENOUS | Status: AC | PRN
  Administered 2021-10-05: 8.39 via INTRAVENOUS

## 2021-10-05 MED ORDER — TECHNETIUM TC 99M TETROFOSMIN IV KIT
30.0000 | PACK | Freq: Once | INTRAVENOUS | Status: AC | PRN
  Administered 2021-10-05: 28 via INTRAVENOUS

## 2021-10-05 MED ORDER — REGADENOSON 0.4 MG/5ML IV SOLN
INTRAVENOUS | Status: AC
Start: 2021-10-05 — End: 2021-10-05
  Administered 2021-10-05: 0.4 mg via INTRAVENOUS
  Filled 2021-10-05: qty 5

## 2021-10-05 MED ORDER — SODIUM CHLORIDE FLUSH 0.9 % IV SOLN
INTRAVENOUS | Status: AC
  Administered 2021-10-05: 10 mL via INTRAVENOUS
  Filled 2021-10-05: qty 10

## 2021-10-05 NOTE — H&P (Signed)
07/30/2020: Patient here today for annual office visit in regards to ongoing surveillance of known bilateral stone disease, bilateral renal cyst and BPH symptoms. Unfortunately he has had quite a to multiple was year. He was diagnosed with myasthenia gravis in required hospitalization for respiratory support last winter. Patient now on high-dose steroids to help with breathing as well as revision support. Currently taking 20 mg of prednisone daily with plans to possibly modified this in February of next year. He has also had quite a few orthopedic issues. Now with newly diagnosed spinal stenosis. He is receiving injections for this. He also underwent a left hip replacement and less than 1 month later suffered a fracture around the prosthesis requiring additional surgical intervention.   Patient continues tamsulosin twice daily. He voids with good force of stream but does endorse increased frequency/urgency as well as nocturia from baseline. He typically can manage getting to the bathroom on time without leaking. He uses a bedside urinal at night. He denies any interval burning or painful urination, visible blood in the urine. He has not been treated for UTI by his and wife report.   He denies any interval stone material passage. He has not had correlating unilateral lower back or flank pain/discomfort to suggest obstructive uropathy.   09/02/21: Brandon [name] returns today in f/u for the history above. He has had some recent hematuria with clots with the last about 2 weeks ago. He has had some LLQ pain. The urine has been dark and he has had some dysuria. His UA today looks infected. KUB today shows some small left renal stones but no clear right renal stones. The renal US shows multiple stones with a 45mm and 66mm stones in the left mid kidney and a few smaller stones on both sides. There is some left hydro. He has had no fever or nausea. He has been taken off of the prednisone for myasthenia. He has spinal issues but  may not be a candidate for surgery because of osteoporosis. he remains on tamsulosin po qday.   CT today shows a 5-58mm cluster of stones at the left UVJ with hydroureteronephrosis and a few renal stones and cyst.   09/16/2021: Returns today for follow-up exam with renal ultrasound in regards to the above-mentioned findings. Urine culture at last office visit was also positive for E. coli. He was treated appropriately with cephalexin. Patient continues tamsulosin as well.   Today he has not seen any definitive stone material passed. Hematuria has lessened to some degree but he is still having burning and painful urination. He also continues to have pain in the left lower back, flank and left hip. UA today continues to show persistent signs of an underlying infectious process despite him continuing cephalexin. Microscopic hematuria noted today as well as continued presence of calcium oxalate crystals. Denies any interval fevers or chills, nausea/vomiting.   09/24/2021: Back today for f/u exam. Review of his ultrasound performed at last office visit with patient's urologist indicated that the previously identified hydronephrosis in the left kidney had improved but there was a strong chance that the previously identified left UVJ calculi had not passed. He is here today for repeat assessment and if indicated especially with any absence of symptom improvement then it would be advisable to proceed with a repeat CT stone protocol study to determine if scheduling for URS is indicated.   Today he continues to be symptomatic, somewhat of an increased severity compared to his last assessment. He is complaining of continued  passage of hematuria which occurs intermittently. Also with associated urgency and some small volume voids. Also complaining of mild to moderate dysuria that is intermittent as well. He continues to have intermittent pain and discomfort in the left lower back and hip as well as occasional radiating  into the flank. He is not having fevers or chills, nausea/vomiting. Denies interval stone material passage. UA continues to appear infected with nitrite positive urine, pyuria, microscopic hematuria and bacteria. No calcium oxalate crystals today. His last urine culture with me was negative but the one prior to that was positive for E. coli which he was treated with cephalexin.     ALLERGIES: No Allergies    MEDICATIONS: Metoprolol Tartrate 25 mg tablet  Allopurinol 300 mg tablet Oral  Amlodipine Besilate  Aspirin Ec 81 mg tablet, delayed release  Calcium  Esomeprazole Magnesium 40 mg capsule,delayed release  Fish Oil CAPS Oral  Gabapentin 300 mg capsule  Mestinon  Multi-Vitamin TABS Oral  Nitroglycerin 0.4 mg tablet, sublingual  Osteo Bi-Flex Regular Strength TABS Oral  Pravachol 40 MG Oral Tablet Oral  Tylenol  Vitamin C 500 mg tablet Oral     GU PSH: Cystoscopy Ureteroscopy - 2015 ESWL, Right - 06/18/2019, 2010 Ureteroscopic stone removal, Left - 2018       PSH Notes: Cystoscopy With Ureteroscopy, Hand Surgery, CABG (CABG), Knee Replacement, Lithotripsy   NON-GU PSH: Back Surgery (Unspecified) CABG (coronary artery bypass grafting) - 2015 Carpal Tunnel Surgery.. Hip Replacement, Left - 07/29/2020 Revise Knee Joint - 2015     GU PMH: Acute Cystitis/UTI, UA continues to appear infected despite being on appropriate antimicrobial treatment with cephalexin. Repeat urine culture sent today. - 09/16/2021, His UA looks infected but he has no fever or significant symptoms of concern. I will culture the urine and treat as needed but he was warned to contact us should he get a fever or more severe pain. , - 09/02/2021 Renal and ureteral calculus - 09/16/2021, He has left distal ureteral stones with obstruction and that is the likely cause of the hematuria and possibly the left hip pain. He is on tamsulosin and has hydrocodone. I will have him return in 2 weeks for a renal US. If he has not  passed the stone, I will consider URS. , - 09/02/2021 (Improving), - 2018 Gross hematuria - 09/02/2021, - 06/11/2019 Ureteral obstruction secondary to calculous - 09/02/2021 Renal calculus - 07/29/2020, (Stable), He has stable small stones. I will have him return in a year with a KUB., - 2019, Bilateral kidney stones, - 2015 Renal cyst - 07/29/2020, Renal cyst, acquired, - 2014 Urinary Frequency - 07/29/2020 Urinary Urgency - 07/29/2020 Ureteral calculus, Right - 07/02/2019, Left, - 2017, Calculus of ureter, - 2015 Nocturia - 2019 BPH w/LUTS (Improving) - 2018 Low back pain, Left-sided low back pain without sciatica - 2015 Bladder-neck stenosis/contracture, Bladder neck contracture - 2014 History of urolithiasis, Nephrolithiasis - 2014      PMH Notes:  2009-01-16 10:23:44 - Note: Arthritis   NON-GU PMH: Encounter for general adult medical examination without abnormal findings, Encounter for preventive health examination - 2015 Personal history of other diseases of the musculoskeletal system and connective tissue, History of gout - 2015 Personal history of other diseases of the circulatory system, History of hypertension - 2014 Personal history of other diseases of the digestive system, History of esophageal reflux - 2014 Personal history of other endocrine, nutritional and metabolic disease, History of hypercholesterolemia - 2014    FAMILY HISTORY: Cardiac Failure -  Mother Death In The Family Father - Father Death In The Family Mother - Mother Family Health Status Number - Runs In Family Heart Disease - Runs In Family Hypertension - Runs In Family Lung Cancer - Father nephrolithiasis - Runs In Family   SOCIAL HISTORY: Marital Status: Married Preferred Language: English; Ethnicity: Not Hispanic Or Latino; Race: White Current Smoking Status: Patient does not smoke anymore.  Social Drinker.  Drinks 2 caffeinated drinks per day. 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      Notes: Occupation:, Caffeine Use, Marital History - Currently Married, Previous History Of Smoking, Alcohol Use   REVIEW OF SYSTEMS:    GU Review Male:   hematuria, blood clots.  Patient reports frequent urination, hard to postpone urination, burning/ pain with urination, get up at night to urinate, and stream starts and stops. Patient denies leakage of urine, trouble starting your stream, have to strain to urinate , erection problems, and penile pain.  Gastrointestinal (Upper):   Patient denies nausea, vomiting, and indigestion/ heartburn.  Gastrointestinal (Lower):   Patient denies diarrhea and constipation.  Constitutional:   Patient denies fever, night sweats, weight loss, and fatigue.  Skin:   Patient denies skin rash/ lesion and itching.  Eyes:   Patient denies blurred vision and double vision.  Ears/ Nose/ Throat:   Patient denies sore throat and sinus problems.  Hematologic/Lymphatic:   Patient denies easy bruising and swollen glands.  Cardiovascular:   Patient denies leg swelling and chest pains.  Respiratory:   Patient denies cough and shortness of breath.  Endocrine:   Patient denies excessive thirst.  Musculoskeletal:   Patient reports back pain. Patient denies joint pain.  Neurological:   Patient denies headaches and dizziness.  Psychologic:   Patient denies depression and anxiety.   VITAL SIGNS:      09/24/2021 01:11 PM  BP 131/79 mmHg  Pulse 72 /min  Temperature 97.8 F / 36.5 C   MULTI-SYSTEM PHYSICAL EXAMINATION:    Constitutional: Well-nourished. No physical deformities. Normally developed. Good grooming.  Neck: Neck symmetrical, not swollen. Normal tracheal position.  Respiratory: No labored breathing, no use of accessory muscles.   Cardiovascular: Normal temperature, normal extremity pulses, no swelling, no varicosities.  Skin: No paleness, no jaundice, no cyanosis. No lesion, no ulcer, no rash. Skin color changes noted that are consistent with chronic steroid  use.  Neurologic / Psychiatric: Oriented to time, oriented to place, oriented to person. No depression, no anxiety, no agitation.  Gastrointestinal: Obese abdomen. No mass, no tenderness, no rigidity. No CVA or flank tenderness.  Musculoskeletal: Normal gait and station of head and neck.     Complexity of Data:  Source Of History:  Patient, Family/Caregiver, Medical Record Summary  Records Review:   Previous Doctor Records, Previous Hospital Records, Previous Patient Records  Urine Test Review:   Urinalysis, Urine Culture  X-Ray Review: Renal Ultrasound: Reviewed Films.  C.T. Abdomen/Pelvis: Reviewed Films. Reviewed Report.  C.T. Stone Protocol: Reviewed Films. Discussed With Patient.     11/20/04  PSA  Total PSA 0.61     09/24/21  Urinalysis  Urine Appearance Turbid   Urine Color Yellow   Urine Glucose Neg mg/dL  Urine Bilirubin Neg mg/dL  Urine Ketones Neg mg/dL  Urine Specific Gravity 1.020   Urine Blood 1+ ery/uL  Urine pH 6.0   Urine Protein 2+ mg/dL  Urine Urobilinogen 0.2 mg/dL  Urine Nitrites Positive   Urine Leukocyte Esterase 3+ leu/uL  Urine WBC/hpf 40 - 60/hpf  Urine RBC/hpf 10 - 20/hpf   Urine Epithelial Cells NS (Not Seen)   Urine Bacteria Mod (26-50/hpf)   Urine Mucous Present   Urine Yeast NS (Not Seen)   Urine Trichomonas Not Present   Urine Cystals NS (Not Seen)   Urine Casts NS (Not Seen)   Urine Sperm Not Present    PROCEDURES:         C.T. Urogram - [id number]      Patient confirmed No Neulasta OnPro Device.         Urinalysis w/Scope Dipstick Dipstick Cont'd Micro  Color: Yellow Bilirubin: Neg mg/dL WBC/hpf: 40 - 60/hpf  Appearance: Turbid Ketones: Neg mg/dL RBC/hpf: 10 - 20/hpf  Specific Gravity: 1.020 Blood: 1+ ery/uL Bacteria: Mod (26-50/hpf)  pH: 6.0 Protein: 2+ mg/dL Cystals: NS (Not Seen)  Glucose: Neg mg/dL Urobilinogen: 0.2 mg/dL Casts: NS (Not Seen)    Nitrites: Positive Trichomonas: Not Present    Leukocyte Esterase: 3+ leu/uL  Mucous: Present      Epithelial Cells: NS (Not Seen)      Yeast: NS (Not Seen)      Sperm: Not Present    ASSESSMENT:      ICD-10 Details  1 GU:   Gross hematuria - K44.0 Acute, Complicated Injury  2   Renal and ureteral calculus - N20.2 Left, Acute, Complicated Injury   PLAN:            Medications New Meds: Bactrim Ds 800 mg-160 mg tablet 1 tablet PO BID   #14  0 Refill(s)  Pharmacy Name:  [id number]  Address:  [address] [place] [address]  Phone:  [phone]  Fax:  [phone]            Orders Labs Urine Culture  X-Rays: C.T. Stone Protocol Without I.V. Contrast  X-Ray Notes: History:  Hematuria: Yes/No  Patient to see MD after exam: Yes/No  Previous exam: CT / IVP/ US/ KUB/ None  When:  Where:  Diabetic: Yes/ No  BUN/ Creatinine:  Date of last BUN Creatinine:  Weight in pounds:  Allergy- IV Contrast: Yes/ No  Conflicting diabetic meds: Yes/ No  Diabetic Meds:  Prior Authorization #: [id number]            Schedule         Document Letter(s):  Created for Patient: Clinical Summary         Notes:   Previously identified distal left ureteral calculi is no longer visible but he has since passed a calculus measuring around 7 mm from its previous location in the left upper pole to the left proximal ureter. Seen easily on CT scout imaging today. He remains symptomatic. UA also appearing infected so I went ahead and initiated empirical treatment. He is on pain management so we will continue Vicodin as previously prescribed. He will also remain on tamsulosin. Once I have discussed and reviewed with his urologist, he will be contacted with recommended plan of care moving forward.   For shockwave lithotripsy I described the risks which include arrhythmia, kidney contusion, kidney hemorrhage, need for transfusion, pain, inability to break up stone, inability to pass stone fragments, Steinstrasse, infection associated with  obstructing stones, need for different surgical procedure, need for repeat shockwave lithotripsy.   We discussed ureteroscopic stone manipulation with basketing and laser-lithotripsy in detail. We discussed risks including bleeding, infection, damage to kidney / ureter bladder, rarely loss of kidney. We discussed anesthetic risks and rare but serious  surgical complications including DVT, PE, MI, and mortality. We specifically addressed that in 5-10% of cases a staged approach is required with stenting followed by re-attempt ureteroscopy if anatomy unfavorable.       APPENDED NOTES:    Spoke with patient and wife, treatment options communicated after discussion with Dr. Jeffie Pollock. He would like to proceed with ureteroscopy. I am going to wait for culture results to come back as patient will likely require suppressive therapy until ureteroscopy can be scheduled. All questions answered to the best of my ability with understanding expressed by the patient and his wife.

## 2021-10-06 ENCOUNTER — Ambulatory Visit (HOSPITAL_BASED_OUTPATIENT_CLINIC_OR_DEPARTMENT_OTHER)
Admission: RE | Admit: 2021-10-06 | Discharge: 2021-10-06 | Disposition: A | Discharge status: Home / Self Care | Payer: Medicare Other | Attending: Urology | Admitting: Urology

## 2021-10-06 ENCOUNTER — Encounter (HOSPITAL_BASED_OUTPATIENT_CLINIC_OR_DEPARTMENT_OTHER)
Admission: RE | Disposition: A | Discharge status: Home / Self Care | Payer: Self-pay | Source: Home / Self Care | Attending: Urology

## 2021-10-06 ENCOUNTER — Ambulatory Visit (HOSPITAL_BASED_OUTPATIENT_CLINIC_OR_DEPARTMENT_OTHER): Payer: Medicare Other | Admitting: Anesthesiology

## 2021-10-06 ENCOUNTER — Encounter (HOSPITAL_BASED_OUTPATIENT_CLINIC_OR_DEPARTMENT_OTHER): Payer: Self-pay | Admitting: Urology

## 2021-10-06 DIAGNOSIS — Z22322 Carrier or suspected carrier of Methicillin resistant Staphylococcus aureus: Secondary | ICD-10-CM

## 2021-10-06 DIAGNOSIS — N202 Calculus of kidney with calculus of ureter: Secondary | ICD-10-CM

## 2021-10-06 DIAGNOSIS — I2510 Atherosclerotic heart disease of native coronary artery without angina pectoris: Secondary | ICD-10-CM

## 2021-10-06 DIAGNOSIS — Z96642 Presence of left artificial hip joint: Secondary | ICD-10-CM | POA: Insufficient documentation

## 2021-10-06 DIAGNOSIS — I10 Essential (primary) hypertension: Secondary | ICD-10-CM | POA: Insufficient documentation

## 2021-10-06 DIAGNOSIS — Z87891 Personal history of nicotine dependence: Secondary | ICD-10-CM | POA: Diagnosis not present

## 2021-10-06 DIAGNOSIS — N210 Calculus in bladder: Secondary | ICD-10-CM | POA: Insufficient documentation

## 2021-10-06 DIAGNOSIS — N281 Cyst of kidney, acquired: Secondary | ICD-10-CM | POA: Diagnosis not present

## 2021-10-06 DIAGNOSIS — G7000 Myasthenia gravis without (acute) exacerbation: Secondary | ICD-10-CM | POA: Diagnosis not present

## 2021-10-06 DIAGNOSIS — Z951 Presence of aortocoronary bypass graft: Secondary | ICD-10-CM | POA: Diagnosis not present

## 2021-10-06 DIAGNOSIS — K219 Gastro-esophageal reflux disease without esophagitis: Secondary | ICD-10-CM | POA: Diagnosis not present

## 2021-10-06 HISTORY — PX: CYSTOSCOPY/URETEROSCOPY/HOLMIUM LASER/STENT PLACEMENT: SHX6546

## 2021-10-06 HISTORY — DX: Calculus of ureter: N20.1

## 2021-10-06 HISTORY — DX: Presence of spectacles and contact lenses: Z97.3

## 2021-10-06 LAB — POCT I-STAT, CHEM 8
BUN: 11 mg/dL (ref 8–23)
BUN: 7 mg/dL — ABNORMAL LOW (ref 8–23)
Calcium, Ion: 1.1 mmol/L — ABNORMAL LOW (ref 1.15–1.40)
Calcium, Ion: 1.21 mmol/L (ref 1.15–1.40)
Chloride: 104 mmol/L (ref 98–111)
Chloride: 103 mmol/L (ref 98–111)
Creatinine, Ser: 1.1 mg/dL (ref 0.61–1.24)
Creatinine, Ser: 1.1 mg/dL (ref 0.61–1.24)
Glucose, Bld: 99 mg/dL (ref 70–99)
Glucose, Bld: 105 mg/dL — ABNORMAL HIGH (ref 70–99)
HCT: 44 % (ref 39.0–52.0)
HCT: 39 % (ref 39.0–52.0)
Hemoglobin: 15 g/dL (ref 13.0–17.0)
Hemoglobin: 13.3 g/dL (ref 13.0–17.0)
Potassium: 6.9 mmol/L (ref 3.5–5.1)
Potassium: 3.7 mmol/L (ref 3.5–5.1)
Sodium: 134 mmol/L — ABNORMAL LOW (ref 135–145)
Sodium: 136 mmol/L (ref 135–145)
TCO2: 25 mmol/L (ref 22–32)
TCO2: 23 mmol/L (ref 22–32)

## 2021-10-06 LAB — MRSA NEXT GEN BY PCR, NASAL: MRSA by PCR Next Gen: NOT DETECTED

## 2021-10-06 SURGERY — CYSTOSCOPY/URETEROSCOPY/HOLMIUM LASER/STENT PLACEMENT
Anesthesia: General | Site: Ureter | Laterality: Left

## 2021-10-06 MED ORDER — EPHEDRINE 5 MG/ML INJ
INTRAVENOUS | Status: AC
  Filled 2021-10-06: qty 5

## 2021-10-06 MED ORDER — MUPIROCIN 2 % EX OINT
1.0000 "application " | TOPICAL_OINTMENT | Freq: Two times a day (BID) | CUTANEOUS | Status: DC
  Administered 2021-10-06: 1 via NASAL
  Filled 2021-10-06: qty 22

## 2021-10-06 MED ORDER — OXYCODONE HCL 5 MG PO TABS
ORAL_TABLET | ORAL | Status: AC
  Filled 2021-10-06: qty 1

## 2021-10-06 MED ORDER — FENTANYL CITRATE (PF) 100 MCG/2ML IJ SOLN: 25.0000 ug | INTRAMUSCULAR | Status: DC | PRN

## 2021-10-06 MED ORDER — 0.9 % SODIUM CHLORIDE (POUR BTL) OPTIME
TOPICAL | Status: DC | PRN
  Administered 2021-10-06: 500 mL

## 2021-10-06 MED ORDER — OXYCODONE HCL 5 MG PO TABS
5.0000 mg | ORAL_TABLET | ORAL | Status: DC | PRN
  Administered 2021-10-06: 5 mg via ORAL

## 2021-10-06 MED ORDER — LIDOCAINE HCL (PF) 2 % IJ SOLN
INTRAMUSCULAR | Status: AC
  Filled 2021-10-06: qty 5

## 2021-10-06 MED ORDER — ONDANSETRON HCL 4 MG/2ML IJ SOLN: 4.0000 mg | Freq: Once | INTRAMUSCULAR | Status: DC | PRN

## 2021-10-06 MED ORDER — PHENYLEPHRINE 40 MCG/ML (10ML) SYRINGE FOR IV PUSH (FOR BLOOD PRESSURE SUPPORT)
PREFILLED_SYRINGE | INTRAVENOUS | Status: AC
Start: 2021-10-06 — End: ?
  Filled 2021-10-06: qty 10

## 2021-10-06 MED ORDER — AMISULPRIDE (ANTIEMETIC) 5 MG/2ML IV SOLN: 10.0000 mg | Freq: Once | INTRAVENOUS | Status: DC | PRN

## 2021-10-06 MED ORDER — CEFAZOLIN SODIUM-DEXTROSE 2-4 GM/100ML-% IV SOLN
2.0000 g | INTRAVENOUS | Status: AC
  Administered 2021-10-06: 2 g via INTRAVENOUS

## 2021-10-06 MED ORDER — LIDOCAINE HCL (CARDIAC) PF 100 MG/5ML IV SOSY
PREFILLED_SYRINGE | INTRAVENOUS | Status: DC | PRN
  Administered 2021-10-06: 60 mg via INTRAVENOUS

## 2021-10-06 MED ORDER — FENTANYL CITRATE (PF) 100 MCG/2ML IJ SOLN
INTRAMUSCULAR | Status: AC
  Filled 2021-10-06: qty 2

## 2021-10-06 MED ORDER — ACETAMINOPHEN 325 MG PO TABS: 650.0000 mg | ORAL_TABLET | ORAL | Status: DC | PRN

## 2021-10-06 MED ORDER — LACTATED RINGERS IV SOLN: INTRAVENOUS | Status: DC

## 2021-10-06 MED ORDER — IOHEXOL 300 MG/ML  SOLN
INTRAMUSCULAR | Status: DC | PRN
  Administered 2021-10-06: 10 mL via URETHRAL

## 2021-10-06 MED ORDER — SODIUM CHLORIDE 0.9 % IV SOLN: 250.0000 mL | INTRAVENOUS | Status: DC | PRN

## 2021-10-06 MED ORDER — MORPHINE SULFATE (PF) 4 MG/ML IV SOLN: 2.0000 mg | INTRAVENOUS | Status: DC | PRN

## 2021-10-06 MED ORDER — PROPOFOL 500 MG/50ML IV EMUL
INTRAVENOUS | Status: AC
  Filled 2021-10-06: qty 50

## 2021-10-06 MED ORDER — DEXAMETHASONE SODIUM PHOSPHATE 10 MG/ML IJ SOLN
INTRAMUSCULAR | Status: AC
  Filled 2021-10-06: qty 1

## 2021-10-06 MED ORDER — CEFAZOLIN SODIUM-DEXTROSE 2-4 GM/100ML-% IV SOLN
INTRAVENOUS | Status: AC
  Filled 2021-10-06: qty 100

## 2021-10-06 MED ORDER — DEXAMETHASONE SODIUM PHOSPHATE 4 MG/ML IJ SOLN
INTRAMUSCULAR | Status: DC | PRN
  Administered 2021-10-06: 6 mg via INTRAVENOUS

## 2021-10-06 MED ORDER — PHENYLEPHRINE 40 MCG/ML (10ML) SYRINGE FOR IV PUSH (FOR BLOOD PRESSURE SUPPORT)
PREFILLED_SYRINGE | INTRAVENOUS | Status: DC | PRN
  Administered 2021-10-06 (×2): 80 ug via INTRAVENOUS
  Administered 2021-10-06: 120 ug via INTRAVENOUS
  Administered 2021-10-06: 80 ug via INTRAVENOUS

## 2021-10-06 MED ORDER — ACETAMINOPHEN 10 MG/ML IV SOLN: 1000.0000 mg | Freq: Once | INTRAVENOUS | Status: DC | PRN

## 2021-10-06 MED ORDER — ONDANSETRON HCL 4 MG/2ML IJ SOLN
INTRAMUSCULAR | Status: AC
  Filled 2021-10-06: qty 2

## 2021-10-06 MED ORDER — SODIUM CHLORIDE 0.9% FLUSH: 3.0000 mL | Freq: Two times a day (BID) | INTRAVENOUS | Status: DC

## 2021-10-06 MED ORDER — EPHEDRINE SULFATE-NACL 50-0.9 MG/10ML-% IV SOSY
PREFILLED_SYRINGE | INTRAVENOUS | Status: DC | PRN
  Administered 2021-10-06 (×2): 10 mg via INTRAVENOUS
  Administered 2021-10-06: 5 mg via INTRAVENOUS
  Administered 2021-10-06: 10 mg via INTRAVENOUS

## 2021-10-06 MED ORDER — FENTANYL CITRATE (PF) 100 MCG/2ML IJ SOLN
INTRAMUSCULAR | Status: DC | PRN
  Administered 2021-10-06: 50 ug via INTRAVENOUS
  Administered 2021-10-06: 100 ug via INTRAVENOUS
  Administered 2021-10-06: 50 ug via INTRAVENOUS

## 2021-10-06 MED ORDER — PROPOFOL 10 MG/ML IV BOLUS
INTRAVENOUS | Status: DC | PRN
  Administered 2021-10-06: 100 mg via INTRAVENOUS
  Administered 2021-10-06: 200 mg via INTRAVENOUS

## 2021-10-06 MED ORDER — SODIUM CHLORIDE 0.9% FLUSH: 3.0000 mL | INTRAVENOUS | Status: DC | PRN

## 2021-10-06 MED ORDER — SODIUM CHLORIDE 0.9 % IR SOLN
Status: DC | PRN
  Administered 2021-10-06: 3000 mL

## 2021-10-06 MED ORDER — ONDANSETRON HCL 4 MG/2ML IJ SOLN
INTRAMUSCULAR | Status: DC | PRN
  Administered 2021-10-06: 4 mg via INTRAVENOUS

## 2021-10-06 MED ORDER — ACETAMINOPHEN 325 MG RE SUPP: 650.0000 mg | RECTAL | Status: DC | PRN

## 2021-10-06 SURGICAL SUPPLY — 25 items
BAG DRAIN URO-CYSTO SKYTR STRL (DRAIN) ×2 IMPLANT
BAG DRN UROCATH (DRAIN) ×1
BASKET STONE 1.7 NGAGE (UROLOGICAL SUPPLIES) ×1 IMPLANT
CATH URET 5FR 28IN OPEN ENDED (CATHETERS) ×1 IMPLANT
CLOTH BEACON ORANGE TIMEOUT ST (SAFETY) ×2 IMPLANT
COVER DOME SNAP 22 D (MISCELLANEOUS) ×1 IMPLANT
GLOVE SURG POLYISO LF SZ6.5 (GLOVE) ×1 IMPLANT
GLOVE SURG POLYISO LF SZ8 (GLOVE) ×2 IMPLANT
GLOVE SURG POLYISO LF SZ8.5 (GLOVE) ×1 IMPLANT
GLOVE SURG UNDER POLY LF SZ6.5 (GLOVE) ×1 IMPLANT
GOWN STRL REUS W/TWL LRG LVL3 (GOWN DISPOSABLE) ×1 IMPLANT
GOWN STRL REUS W/TWL XL LVL3 (GOWN DISPOSABLE) ×3 IMPLANT
GUIDEWIRE ANG ZIPWIRE 038X150 (WIRE) ×1 IMPLANT
GUIDEWIRE STR DUAL SENSOR (WIRE) ×2 IMPLANT
IV NS IRRIG 3000ML ARTHROMATIC (IV SOLUTION) ×2 IMPLANT
KIT TURNOVER CYSTO (KITS) ×2 IMPLANT
MANIFOLD NEPTUNE II (INSTRUMENTS) ×2 IMPLANT
NS IRRIG 500ML POUR BTL (IV SOLUTION) ×2 IMPLANT
PACK CYSTO (CUSTOM PROCEDURE TRAY) ×2 IMPLANT
SHEATH URETERAL 12FRX35CM (MISCELLANEOUS) ×1 IMPLANT
STENT URET 6FRX26 CONTOUR (STENTS) ×1 IMPLANT
TRACTIP FLEXIVA PULS ID 200XHI (Laser) IMPLANT
TRACTIP FLEXIVA PULSE ID 200 (Laser) ×2
TUBE CONNECTING 12X1/4 (SUCTIONS) ×2 IMPLANT
TUBING UROLOGY SET (TUBING) ×1 IMPLANT

## 2021-10-06 NOTE — Op Note (Signed)
Procedure: 1.  Cystoscopy with cystolitholapaxy of less than 2.5 cm bladder stone. 2.  Cystoscopy with left retrograde pyelogram and interpretation. 3.  Left ureteroscopy with holmium laser lithotripsy of left proximal ureteral and left lower pole renal stones with stone extraction and insertion of left double-J stent. 4.  Application of fluoroscopy.  Preop diagnosis: Left proximal ureteral and renal stones.  Postop diagnosis: Same with prostatic urethral/bladder stones.  Surgeon: Dr. Irine Seal.  Anesthesia: General.  Specimen: Stone fragments.  Drain: 6 [place] by 26 cm left contour double-J stent without tether.  EBL: None.  Complications: None.  Indications: The patient is a 76 year old male who was diagnosed recently with a left proximal ureter stone approximately 7 to 8 mm in size with obstruction.  He was also noticed to have left renal stones with a small mid renal stone and a larger 6 to 7 mm lower pole stone.  He had previously passed a left distal ureteral stone.  Procedure: He had been on Bactrim preoperatively at a suppressive dose for recurrent urinary tract infection.  He was given Ancef in the OR.  A general anesthetic was induced and he was placed in the lithotomy position with great care during positioning because of his hip prosthesis and pain during initial tabling.  He was prepped with Betadine solution and draped in usual sterile fashion.  A scout fluoroscopic spot film demonstrated the presence of the proximal stone adjacent to the spine the upper ureter and smaller stone in the left kidney lower pole.  The 21 French cystoscope was passed using the 30 degree lens.  Examination: Normal urethra.  The external sphincter was intact.  The prostatic urethra bilobar hyperplasia with some obstruction and there is a small middle lobe.  In the posterior prostatic urethra there was a jagged appearing stone approximately 7 or 8 mm in size with a second stone adjacent these were  pushed back into the bladder and it was difficult to know if these were his previously passed ureteral stones or prostatic stones that eroded through the mucosa.  Examination of bladder revealed normal appearing ureteral orifice ease the bladder wall had mild trabeculation without tumors, other stones or inflammation.  The manual lithotrite was then passed through the cystoscope and the stones into been pushed back into the bladder were broken into manageable fragments and aspirated from the bladder.  The cystoscope was then reinserted with standard bridge and a 5 [place] open-ended catheter was passed up the left ureteral orifice.  Omnipaque was instilled.  The left retrograde pyelogram revealed some mild narrowing of the intramural ureter but a very generous calibered distal and mid ureter up to the stone in the mid proximal ureter.  Minimal contrast effluxed proximal to the stone.  Initially the open-ended catheter was advanced to just below the stone and a sensor wire was then passed through the catheter and attempt was made to negotiated by the stone however the stone was quite impacted and the wire would not go.  I then switched to an angled tip Glidewire and after a couple of efforts was able to get the wire by the stone into the kidney and the open-ended catheter then followed.  The Glidewire was removed and there was a brisk hydronephrotic drip of clear urine.  The sensor wire was then replaced and the open-ended catheter was removed leaving the sensor wire in the kidney.  Cystoscope was removed and a 35 cm 12 [place] digital access sheath inner core was passed over the  wire to the level of the stone without difficulty.  This was then followed by the assembled sheath.  The inner core and wire were then removed.  The dual-lumen digital flexible scope was advanced through the sheath and the impacted ureteral stone was readily visualized and engaged with a 242 m tract tip laser fiber with the left  pedal set on 0.5 J and 20 Hz in the right pedal on 1 J and 10 Hz.  A combination of fragmentation energies was used to break the stone into manageable fragments several of which refluxed back to the kidney.  An engage basket was then used to remove several fragments from the ureter.  I then advanced the scope into the kidney and removed additional fragments from the renal pelvis.  The scope was then passed in each of the calyces and in the mid calyx there was approximately 3 to 4 mm stone adherent to a papillary tip and this was removed with the engage basket intact.  In the lower calyx there was a larger stone approximately 7 to 8 mm in size there was fragmented with the laser and the remaining fragments were then removed leaving only dust and sand in the lower collecting system.  Final inspection of the intrarenal collecting system revealed no significant additional stone fragments so the wire was passed back through the scope which was then removed along with the sheath under direct vision.  In the distal ureter I came across 3 moderate sized fragments that it likely flush downward following the initial fragmentation.  The sheath and digital scope were removed leaving the wire in place and a dual-lumen semirigid scope was then passed alongside the wire and the remaining fragments were removed either intact or after further fragmentation to the bladder.  After final inspection and fluoroscopy revealed no significant retained fragments, the ureteroscope was removed and the cystoscope was reinserted over the wire.  A 6 French by 26 cm left contour double-J stent was then passed under fluoroscopic guidance.  The wire was removed, leaving a good coil in the kidney and a good coil in the bladder.  The remaining stone fragments were then aspirated from the bladder which was partially drained and the cystoscope was removed.  The patient was taken down from lithotomy position, his anesthetic was reversed and  he was moved to recovery in stable condition.  There were no complications.  His wife was given some of the stone fragments and others were taken in the office for analysis.

## 2021-10-06 NOTE — Anesthesia Preprocedure Evaluation (Addendum)
Anesthesia Evaluation  Patient identified by MRN, date of birth, ID band Patient awake    Reviewed: Allergy & Precautions, NPO status , Patient's Chart, lab work & pertinent test results  Airway Mallampati: III  TM Distance: >3 FB Neck ROM: Full    Dental no notable dental hx.    Pulmonary former smoker,    Pulmonary exam normal        Cardiovascular hypertension, Pt. on medications and Pt. on home beta blockers + CAD and + CABG  Normal cardiovascular exam  ECG: NSR, rate 68   Neuro/Psych Myasthenia Gravis  Neuromuscular disease negative psych ROS   GI/Hepatic Neg liver ROS, GERD  Medicated and Controlled,  Endo/Other  negative endocrine ROS  Renal/GU negative Renal ROS     Musculoskeletal  (+) Arthritis , Gout   Abdominal   Peds  Hematology negative hematology ROS (+)   Anesthesia Other Findings LEFT UETERAL AND LEFT RENAL STONE  Reproductive/Obstetrics                            Anesthesia Physical Anesthesia Plan  ASA: 3  Anesthesia Plan: General   Post-op Pain Management:    Induction: Intravenous  PONV Risk Score and Plan: 2 and Ondansetron, Dexamethasone and Treatment may vary due to age or medical condition  Airway Management Planned: LMA  Additional Equipment:   Intra-op Plan:   Post-operative Plan: Extubation in OR  Informed Consent: I have reviewed the patients History and Physical, chart, labs and discussed the procedure including the risks, benefits and alternatives for the proposed anesthesia with the patient or authorized representative who has indicated his/her understanding and acceptance.     Dental advisory given  Plan Discussed with: CRNA  Anesthesia Plan Comments:        Anesthesia Quick Evaluation

## 2021-10-06 NOTE — Anesthesia Procedure Notes (Signed)
Procedure Name: LMA Insertion Date/Time: 10/06/2021 10:05 AM Performed by: Lieutenant Diego, CRNA Pre-anesthesia Checklist: Patient identified, Emergency Drugs available, Suction available and Patient being monitored Patient Re-evaluated:Patient Re-evaluated prior to induction Oxygen Delivery Method: Circle system utilized Preoxygenation: Pre-oxygenation with 100% oxygen Induction Type: IV induction Ventilation: Mask ventilation without difficulty LMA: LMA inserted LMA Size: 5.0 Number of attempts: 1 Placement Confirmation: positive ETCO2 and breath sounds checked- equal and bilateral Tube secured with: Tape Dental Injury: Teeth and Oropharynx as per pre-operative assessment

## 2021-10-06 NOTE — Anesthesia Postprocedure Evaluation (Signed)
Anesthesia Post Note  [name]  Procedure(s) Performed: CYSTOSCOPY LEFT RETROGRADE PYELOGRAM URETEROSCOPY/HOLMIUM LASER/STENT PLACEMENT (Left: Ureter)     Patient location during evaluation: PACU Anesthesia Type: General Level of consciousness: awake Pain management: pain level controlled Vital Signs Assessment: post-procedure vital signs reviewed and stable Respiratory status: spontaneous breathing, nonlabored ventilation, respiratory function stable and patient connected to nasal cannula oxygen Cardiovascular status: blood pressure returned to baseline and stable Postop Assessment: no apparent nausea or vomiting Anesthetic complications: no   No notable events documented.  Last Vitals:  Vitals:   10/06/21 1145 10/06/21 1155  BP: (!) 144/75 118/72  Pulse: 77 71  Resp: 17 16  Temp:  36.5 C  SpO2: 95% 98%    Last Pain:  Vitals:   10/06/21 1155  TempSrc: Oral  PainSc: 3                  Dontell Mian P Maralee Higuchi

## 2021-10-06 NOTE — Interval H&P Note (Signed)
History and Physical Interval Note:  10/06/2021 9:21 AM  Brandon [name]  has presented today for surgery, with the diagnosis of LEFT UETERAL AND LEFT RENAL STONE.  The various methods of treatment have been discussed with the patient and family. After consideration of risks, benefits and other options for treatment, the patient has consented to  Procedure(s): CYSTOSCOPY LEFT RETROGRADE PYELOGRAM URETEROSCOPY/HOLMIUM LASER/STENT PLACEMENT (Left) as a surgical intervention.  The patient's history has been reviewed, patient examined, no change in status, stable for surgery.  I have reviewed the patient's chart and labs.  Questions were answered to the patient's satisfaction.     Irine Seal

## 2021-10-06 NOTE — Discharge Instructions (Signed)

## 2021-10-06 NOTE — Transfer of Care (Signed)
Immediate Anesthesia Transfer of Care Note  [name]  Procedure(s) Performed: CYSTOSCOPY LEFT RETROGRADE PYELOGRAM URETEROSCOPY/HOLMIUM LASER/STENT PLACEMENT (Left: Ureter)  Patient Location: PACU  Anesthesia Type:General  Level of Consciousness: awake and alert   Airway & Oxygen Therapy: Patient Spontanous Breathing and Patient connected to face mask oxygen  Post-op Assessment: Report given to RN and Post -op Vital signs reviewed and stable  Post vital signs: Reviewed and stable  Last Vitals:  Vitals Value Taken Time  BP    Temp    Pulse 85 10/06/21 1124  Resp 31 10/06/21 1124  SpO2 92 % 10/06/21 1124  Vitals shown include unvalidated device data.  Last Pain:  Vitals:   10/06/21 0808  TempSrc: Oral  PainSc: 2       Patients Stated Pain Goal: 6 (18/98/42 1031)  Complications: No notable events documented.

## 2021-10-06 NOTE — Progress Notes (Signed)
CRITICAL RESULT PROVIDER NOTIFICATION  Test performed and critical result:  ISTAT K+ 6.9. New sample was drawn and test repeated with new result K+ 3.7.  Date and time result received:  10/06/21 0932  Provider name/title: Dr. Adele Barthel  Date and time provider notified: 10/06/21 0850  Date and time provider responded: 10/06/21 0850  Provider response:No new orders

## 2021-10-07 ENCOUNTER — Encounter (HOSPITAL_BASED_OUTPATIENT_CLINIC_OR_DEPARTMENT_OTHER): Payer: Self-pay | Admitting: Urology

## 2021-10-07 NOTE — Telephone Encounter (Signed)
I s/w the pt and confirmed that no other surgery is in process for him. He did also confirm that he had urologic procedure yesterday.

## 2021-10-07 NOTE — Telephone Encounter (Signed)
I am covering preop today. Dr. Myles Gip nurse routed copy of patient's stress test 10/05/20 today to preop pool with Dr. Myles Gip message attached "Myoview is low risk. May proceed with planned surgery - please see chart notes and send to the pre-op pool for review." He gave permission to hold ASA 7 days if needed.  Per chart review, patient underwent urologic surgery yesterday, which was the only active clearance that I could find below. However, Angie's note also stated that Dr. Myles Gip prior note mentioned spine surgery. Will route to callback to clarify with patient whether there are any additional surgeries pending at this time. Please route back to preop pool if there are additional clearances we need to address at this time. Thank you!

## 2021-10-08 ENCOUNTER — Encounter (HOSPITAL_COMMUNITY): Payer: Medicare Other

## 2021-10-12 ENCOUNTER — Ambulatory Visit: Payer: Medicare Other | Admitting: Physical Therapy

## 2021-10-13 DIAGNOSIS — N202 Calculus of kidney with calculus of ureter: Secondary | ICD-10-CM | POA: Diagnosis not present

## 2021-10-13 DIAGNOSIS — N200 Calculus of kidney: Secondary | ICD-10-CM | POA: Diagnosis not present

## 2021-10-16 DIAGNOSIS — M5416 Radiculopathy, lumbar region: Secondary | ICD-10-CM | POA: Diagnosis not present

## 2021-10-19 ENCOUNTER — Encounter: Payer: Self-pay | Admitting: Physical Therapy

## 2021-10-19 ENCOUNTER — Ambulatory Visit: Payer: Medicare Other | Attending: Family Medicine | Admitting: Physical Therapy

## 2021-10-19 ENCOUNTER — Other Ambulatory Visit: Payer: Self-pay

## 2021-10-19 DIAGNOSIS — R2689 Other abnormalities of gait and mobility: Secondary | ICD-10-CM | POA: Diagnosis not present

## 2021-10-19 DIAGNOSIS — M6281 Muscle weakness (generalized): Secondary | ICD-10-CM | POA: Diagnosis not present

## 2021-10-19 DIAGNOSIS — R2681 Unsteadiness on feet: Secondary | ICD-10-CM | POA: Insufficient documentation

## 2021-10-19 DIAGNOSIS — G8929 Other chronic pain: Secondary | ICD-10-CM | POA: Diagnosis not present

## 2021-10-19 DIAGNOSIS — M25552 Pain in left hip: Secondary | ICD-10-CM | POA: Insufficient documentation

## 2021-10-19 DIAGNOSIS — M545 Low back pain, unspecified: Secondary | ICD-10-CM | POA: Insufficient documentation

## 2021-10-20 ENCOUNTER — Other Ambulatory Visit (HOSPITAL_COMMUNITY): Payer: Self-pay | Admitting: Family Medicine

## 2021-10-20 DIAGNOSIS — M810 Age-related osteoporosis without current pathological fracture: Secondary | ICD-10-CM | POA: Diagnosis not present

## 2021-10-20 DIAGNOSIS — M5136 Other intervertebral disc degeneration, lumbar region: Secondary | ICD-10-CM | POA: Diagnosis not present

## 2021-10-20 DIAGNOSIS — Z6826 Body mass index (BMI) 26.0-26.9, adult: Secondary | ICD-10-CM | POA: Diagnosis not present

## 2021-10-20 DIAGNOSIS — E663 Overweight: Secondary | ICD-10-CM | POA: Diagnosis not present

## 2021-10-20 DIAGNOSIS — G7000 Myasthenia gravis without (acute) exacerbation: Secondary | ICD-10-CM | POA: Diagnosis not present

## 2021-10-20 NOTE — Therapy (Signed)
[name] [address] [name], [place], [address] Phone: [phone]   Fax:  [phone]  Physical Therapy Treatment  Patient Details  Name: MIZRAIM HARMENING MRN: [id number] Date of Birth: 11-04-1945 Referring Provider (PT): Elwin Sleight, DO   Encounter Date: 10/19/2021   PT End of Session - 10/20/21 0919     Visit Number 8    Number of Visits 12    Date for PT Re-Evaluation 11/16/21    Authorization Type Medicare    Authorization Time Period 08-19-21 - 10-16-21;  10-19-21 - 11-16-21    PT Start Time 1310    PT Stop Time 1400    PT Time Calculation (min) 50 min    Equipment Utilized During Treatment Other (comment)   pool noodle, small bar bells, aquatic cuffs   Activity Tolerance Patient tolerated treatment well    Behavior During Therapy [facility] for tasks assessed/performed             Past Medical History:  Diagnosis Date   Arthritis    oa   Coronary atherosclerosis of native coronary artery    Multivessel status post CABG March 2015   DDD (degenerative disc disease), lumbar    sees dr Vertell Limber dr Vertell Limber currently out of office sees dr Reatha Armour   Essential hypertension    GERD (gastroesophageal reflux disease)    Hard of hearing both ears    left ear is worst   Hemorrhoids    History of gout    History of kidney stones    history of myasthenia Gravis    dx Sep 24 2019   History of [place] spotted fever    yrs ago per pt wife on 10-01-2021   Hypertensive heart disease without heart failure    Left ureteral  & renal stone    Lumbago 10/01/2021   back pain chronic sees dr Lang Snow at dr Reatha Armour office may get back surgery   Mixed hyperlipidemia    Personal history of COVID-19 08/10/2021   fever tired x 1 week took paxlovid all symptoms resolved   Plantar fascial fibromatosis    not sure when   Postoperative atrial fibrillation ([place])    01-28-2020 NONE SINCE   Skin Cancer ([place])    Use of cane as ambulatory aid  10/01/2021   prn   Uses roller walker 10/01/2021   prn   UTI (urinary tract infection) 09/16/2021   finishes bid bactrim 10-02-2021, then starts daily bactrim for 30 days   Wears glasses     Past Surgical History:  Procedure Laterality Date   AMPUTATION Right 03/27/2014   Procedure: DEBRIDEMENT AND CLOSURE RIGHT INDEX FINGER;  Surgeon: [name], MD;  Location: [place];  Service: Orthopedics;  Laterality: Right;   BACK SURGERY     lower not sure when per wife on 10-01-2021   BICEPT TENODESIS  11/26/2011   Procedure: BICEPT TENODESIS;  Surgeon: Augustin Schooling, MD;  Location: [place];  Service: Orthopedics;  Laterality: Left;   BIOPSY  08/03/2018   Procedure: BIOPSY;  Surgeon: Rogene Houston, MD;  [facility];  Service: Endoscopy;;  antral   Cataract surgery Bilateral    yrs ago per wife on 10-01-2021   CHOLECYSTECTOMY N/A 09/22/2018   Procedure: LAPAROSCOPIC CHOLECYSTECTOMY;  Surgeon: Virl Cagey, MD;  [facility]: [facility];  Service: General;  Laterality: N/A;   COLONOSCOPY N/A 12/26/2014   Procedure: COLONOSCOPY;  Surgeon: Rogene Houston, MD;  [facility];  Service: Endoscopy;  Laterality: N/A;  730   CORONARY ARTERY BYPASS GRAFT N/A 10/19/2013   Procedure: CORONARY ARTERY BYPASS GRAFTING (CABG);  Surgeon: Rexene Alberts, MD;  Location: [place];  Service: Open Heart Surgery;  Laterality: N/A;  CABG times four using left internal mammary artery and left leg saphenous vein, incision made on right leg but no vein removed   CYSTOSCOPY WITH RETROGRADE PYELOGRAM, URETEROSCOPY AND STENT PLACEMENT Left 08/24/2016   Procedure: CYSTOSCOPY WITH LEFT RETROGRADE PYELOGRAM, LEFT URETEROSCOPY, BASKET EXTRACTION LEFT URETERAL STONE;  Surgeon: Irine Seal, MD;  [facility];  Service: Urology;  Laterality: Left;   CYSTOSCOPY/URETEROSCOPY/HOLMIUM LASER/STENT PLACEMENT Left 10/06/2021   Procedure: CYSTOSCOPY LEFT RETROGRADE PYELOGRAM URETEROSCOPY/HOLMIUM LASER/STENT  PLACEMENT;  Surgeon: Irine Seal, MD;  Location: [facility];  Service: Urology;  Laterality: Left;   ESOPHAGOGASTRODUODENOSCOPY N/A 08/03/2018   Procedure: ESOPHAGOGASTRODUODENOSCOPY (EGD);  Surgeon: Rogene Houston, MD;  [facility];  Service: Endoscopy;  Laterality: N/A;  2:55   EXTRACORPOREAL SHOCK WAVE LITHOTRIPSY Right 06/18/2019   Procedure: EXTRACORPOREAL SHOCK WAVE LITHOTRIPSY (ESWL);  Surgeon: Lucas Mallow, MD;  [facility];  Service: Urology;  Laterality: Right;   INTRAOPERATIVE TRANSESOPHAGEAL ECHOCARDIOGRAM N/A 10/19/2013   Procedure: INTRAOPERATIVE TRANSESOPHAGEAL ECHOCARDIOGRAM;  Surgeon: Rexene Alberts, MD;  Location: [place];  Service: Open Heart Surgery;  Laterality: N/A;   KYPHOPLASTY N/A 03/27/2021   Procedure: Lumbar One and Lumbar Two Kyphoplasty;  Surgeon: Erline Levine, MD;  Location: [place];  Service: Neurosurgery;  Laterality: N/A;   left hand carpal tunnel release  04/04/2015   LEFT HEART CATHETERIZATION WITH CORONARY ANGIOGRAM N/A 10/09/2013   Procedure: LEFT HEART CATHETERIZATION WITH CORONARY ANGIOGRAM;  Surgeon: Burnell Blanks, MD;  Location: [facility];  Service: Cardiovascular;  Laterality: N/A;   Left shoulder rotator cuff repair  12/04/2011   MOHS procedure  01/06/2021   dr Harl Bowie on left hand   ORIF PERIPROSTHETIC FRACTURE Left 03/19/2020   Procedure: OPEN REDUCTION INTERNAL FIXATION (ORIF) PERIPROSTHETIC FRACTURE LEFT FEMUR WITH FEMORAL REVISION;  Surgeon: Gaynelle Arabian, MD;  [facility];  Service: Orthopedics;  Laterality: Left;   TOTAL HIP ARTHROPLASTY Left 01/28/2020   Procedure: TOTAL HIP ARTHROPLASTY ANTERIOR APPROACH;  Surgeon: Gaynelle Arabian, MD;  [facility];  Service: Orthopedics;  Laterality: Left;  142mn   TOTAL KNEE ARTHROPLASTY Left 04/18/2001   traumaticvamputation of finger  2015   VASECTOMY     yrs ago per wife on 10-01-2021    There were no vitals filed for this visit.    Subjective Assessment - 10/19/21 2141     Subjective Pt had cytoscopy surgery on 10-06-21, then had a stent in for the following week; reports pain is decreased but he continues to have back pain; states he saw neurosurgeon last week and he stated he was not a candidate for surgery.  Pt reports he is going to get a 2nd opinion.    Patient is accompained by: Family member   wife [name]   Pertinent History myasthenia gravis (dx'd Jan. 2021); OA of hip, closed displaced fracture Lt femoral neck, periprosthetic fracture around internal prosthetic Lt hip joint, fracture lumbar vertebra, Lt THA 01-28-20, kyphoplasty 03-27-21, ORIF periprosthetic fracture 03-19-20; lumbar radiculopathy    Patient Stated Goals reduce pain and increase strength in legs    Currently in Pain? Yes    Pain Score 5     Pain Location Back    Pain Orientation Left;Lower  Pain Descriptors / Indicators Aching;Discomfort;Nagging    Pain Type Chronic pain    Pain Onset More than a month ago    Pain Frequency Constant                Aquatic therapy at [facility] - pool temp. 92 degrees  Patient seen for aquatic therapy today.  Treatment took place in water 3.5-4.8 feet deep depending upon activity.  Pt entered and exited the pool via step negotiation with use of hand rails with supervision.  Pt performed water walking forwards 18' x 6 reps, backwards 18' x 2 reps and sideways 18' x 4 reps each with UE support barbells (no bar bells used for forward amb. 18' x 2 reps)  Marching in place 10 reps with UE support on pool edge prn for balance  Pt performed crossovers in front 18' x 2 reps with min assist for balance recovery - pt held bar bells for assist with balance  Pt performed squats x 10 reps with bil. UE support on pool edge  Standing bil. heel raises 10 reps with UE support on pool edge  Standing hip abduction with use of aquatic cuff 10 reps bil. LE's ; pt performed hip flexion, extension and marching 10 reps each  leg with UE support  Pt performed supine exercises - bicycling LE's 3 sets 10 reps; hip abdct./adduction 3 sets 10 reps; pt supported in supine with use of noodle - supported by PT  Pt requires buoyancy of water for support with standing balance and with gait training without UE support; buoyancy of water also needed for joint unloading for pain reduction with weight bearing; buoyancy also provides spinal decompression for reduced back pain in standing;  viscosity of water needed for resistance for strengthening bil. LE's:  water current provides perturbations for challenges with balance that cannot be safely performed on land                                 PT Short Term Goals - 10/20/21 0920       PT SHORT TERM GOAL #1   Title Initiate aquatic therapy at [facility] pt will amb. from car to pool area with RW with supervision with c/o hip pain </= 8/10 after amb. this distance.    Baseline Inconsistently met - pt rates pain 8-9/10 in Lt back and hip areas; pt states that MD said pain may be partially due to kidney stones (diagnosed 2-3 weeks ago) and not completely due to back pain    Time 4    Period Weeks    Status On-going    Target Date 09/18/21      PT SHORT TERM GOAL #2   Title Pt will report decreased Lt hip pain to </= 7/10 with weight bearing.    Baseline 9/10 reported on 08-19-21; pt reports pain varies in intensity - 09-28-21    Time 4    Period Weeks    Status On-going    Target Date 09/18/21      PT SHORT TERM GOAL #3   Title Pt will perform sit to stand from bench in pool with 1 UE support and will stabilize independently upon initial standing to demo increased strength in bil LE's.    Time 4    Period Weeks    Status Achieved    Target Date 09/18/21      PT SHORT TERM GOAL #4  Title Pt will amb. 8' in pool with SBA without UE support.    Time 4    Period Weeks    Status Achieved    Target Date 09/18/21                PT Long Term Goals - 10/20/21 0920       PT LONG TERM GOAL #1   Title Independent in aquatic HEP to be continued at facility of pt's choice upon discharge from PT.    Baseline pt missed 2 aquatic sessions due to having cytoscopy surgery on 10-06-21; HEP is ongoing    Time 5    Period Weeks    Status On-going    Target Date 11/16/21      PT LONG TERM GOAL #2   Title Pt will report reduced back/hip pain to </= 5/10 with weight bearing.    Baseline 9/10 reported on 08-19-21; pt reports pain in Lt low back at 6/10 intensity - improved but not to goal level    Time 5    Period Weeks    Status On-going    Target Date 11/16/21      PT LONG TERM GOAL #3   Title Pt will amb. 144' in pool without UE support with c/o pain </= 6/10 at end of this amb. distance.    Baseline goal met 10-19-21    Time 5    Period Weeks    Status Achieved    Target Date 10/16/21      PT LONG TERM GOAL #4   Title Improve TUG score from 21.75 secs to </= 15 secs with RW for reduced fall risk.    Baseline 21.75 secs with RW on 08-19-21    Time 5    Period Weeks    Status On-going    Target Date 11/16/21                   Plan - 10/20/21 2004     Clinical Impression Statement Pt has met LTG #3 with LTG #1,2 and 4 ongoing as pt reports decreased pain since having had cytoscopy surgery on 10-06-21.  Pt continues to have back pain but states pain overall is somewhat reduced due to removal of kidney stones.  Pt reports he uses RW to decrease back pain with ambulation as weight bearing through UE's decreases stress on his back.  Pt has progressed to amb. in pool in 4' water depth without use of assistive device as buoyancy of water unweights him resulting in decreased pain.  Pt will benefit from additional aquatic PT sessions to address LE weakness, balance and gait deficits.    Personal Factors and Comorbidities Comorbidity 2;Time since onset of injury/illness/exacerbation;Past/Current Experience     Comorbidities myasthenia gravis, s/p Lt THA x 2 with Lt periprosthetic femur fracture, L1 & L2 kyphoplasty procedures for vertebral compression fractures    Examination-Activity Limitations Squat;Bend;Stairs;Stand;Transfers;Locomotion Level    Examination-Participation Restrictions Cleaning;Community Activity;Laundry;Meal Prep;Yard Work;Shop    Stability/Clinical Decision Making Evolving/Moderate complexity    Rehab Potential Good    PT Frequency 1x / week    PT Duration 4 weeks    PT Treatment/Interventions Aquatic Therapy    PT Next Visit Plan cont with aquatic therapy    Consulted and Agree with Plan of Care Patient;Family member/caregiver    Family Member Consulted [name]             Patient will benefit from skilled therapeutic intervention in order to improve  the following deficits and impairments:  Difficulty walking, Decreased balance, Pain, Decreased strength  Visit Diagnosis: Other abnormalities of gait and mobility - Plan: PT plan of care cert/re-cert  Unsteadiness on feet - Plan: PT plan of care cert/re-cert  Muscle weakness (generalized) - Plan: PT plan of care cert/re-cert  Pain in left hip - Plan: PT plan of care cert/re-cert  Chronic left-sided low back pain without sciatica - Plan: PT plan of care cert/re-cert     Problem List Patient Active Problem List   Diagnosis Date Noted   Fracture, lumbar vertebra, compression ([place]) 03/27/2021   Atrial fibrillation with RVR ([place])    Periprosthetic fracture around internal prosthetic left hip joint ([place]) 03/19/2020   OA (osteoarthritis) of hip 01/28/2020   Closed displaced fracture of left femoral neck ([place]) 01/28/2020   History of colonic polyps 11/12/2019   GERD (gastroesophageal reflux disease) 11/06/2019   Myasthenia gravis with (acute) exacerbation ([place]) 11/06/2019   Acute diarrhea 09/17/2019   Heme positive stool 09/17/2019   Unspecified osteoarthritis, unspecified site    Hyperlipidemia, unspecified     Personal history of other diseases of the musculoskeletal system and connective tissue    Atherosclerotic heart disease of native coronary artery without angina pectoris    Hypertensive heart disease without heart failure    Melena    Plantar fascial fibromatosis    Pleurodynia    Calculus of gallbladder without cholecystitis without obstruction 09/14/2018   Nausea without vomiting 06/05/2018   Postoperative atrial fibrillation ([place]) 11/09/2013   S/P CABG x 4 10/19/2013   Coronary atherosclerosis of native coronary artery 10/18/2013   Essential hypertension, benign 09/18/2013   Family history of hypertrophic cardiomyopathy 09/18/2013   Mixed hyperlipidemia     Brandon [name], PT 10/20/2021, 8:35 PM  [facility] [address] [place], [place], [address] Phone: [phone]   Fax:  [phone]  Name: THAER MIYOSHI MRN: [id number] Date of Birth: 12/12/1945

## 2021-10-26 ENCOUNTER — Ambulatory Visit: Payer: Medicare Other | Admitting: Physical Therapy

## 2021-10-26 ENCOUNTER — Other Ambulatory Visit: Payer: Self-pay

## 2021-10-26 DIAGNOSIS — M6281 Muscle weakness (generalized): Secondary | ICD-10-CM | POA: Diagnosis not present

## 2021-10-26 DIAGNOSIS — M79644 Pain in right finger(s): Secondary | ICD-10-CM | POA: Diagnosis not present

## 2021-10-26 DIAGNOSIS — G8929 Other chronic pain: Secondary | ICD-10-CM | POA: Diagnosis not present

## 2021-10-26 DIAGNOSIS — M79645 Pain in left finger(s): Secondary | ICD-10-CM | POA: Diagnosis not present

## 2021-10-26 DIAGNOSIS — R2689 Other abnormalities of gait and mobility: Secondary | ICD-10-CM

## 2021-10-26 DIAGNOSIS — R2681 Unsteadiness on feet: Secondary | ICD-10-CM

## 2021-10-26 DIAGNOSIS — M545 Low back pain, unspecified: Secondary | ICD-10-CM | POA: Diagnosis not present

## 2021-10-26 DIAGNOSIS — M25552 Pain in left hip: Secondary | ICD-10-CM | POA: Diagnosis not present

## 2021-10-26 DIAGNOSIS — M19021 Primary osteoarthritis, right elbow: Secondary | ICD-10-CM | POA: Diagnosis not present

## 2021-10-27 ENCOUNTER — Encounter: Payer: Self-pay | Admitting: Physical Therapy

## 2021-10-27 NOTE — Therapy (Signed)
[place] [facility] ?[place], [place], [address] ?Phone: [phone]   Fax:  [phone] ? ?Physical Therapy Treatment ? ?Patient Details  ?Name: RICHIE BONANNO ?MRN: [id number] ?Date of Birth: [birth year] ?Referring Provider (PT): Elwin Sleight, DO ? ? ?Encounter Date: 10/26/2021 ? ? PT End of Session - 10/27/21 1827   ? ? Visit Number 9   ? Number of Visits 12   ? Date for PT Re-Evaluation 11/16/21   ? Authorization Type Medicare   ? Authorization Time Period 08-19-21 - 10-16-21;  10-19-21 - 11-16-21   ? PT Start Time 1310   ? PT Stop Time 1355   ? PT Time Calculation (min) 45 min   ? Equipment Utilized During Treatment Other (comment)   pool noodle, small bar bells, aquatic cuffs  ? Activity Tolerance Patient tolerated treatment well   ? Behavior During Therapy [facility] for tasks assessed/performed   ? ?  ?  ? ?  ? ? ?Past Medical History:  ?Diagnosis Date  ? Arthritis   ? oa  ? Coronary atherosclerosis of native coronary artery   ? Multivessel status post CABG March 2015  ? DDD (degenerative disc disease), lumbar   ? sees dr Vertell Limber dr Vertell Limber currently out of office sees dr Reatha Armour  ? Essential hypertension   ? GERD (gastroesophageal reflux disease)   ? Hard of hearing both ears   ? left ear is worst  ? Hemorrhoids   ? History of gout   ? History of kidney stones   ? history of myasthenia Gravis   ? dx Sep 24 2019  ? History of Rocky [place] spotted fever   ? yrs ago per pt wife on 10-01-2021  ? Hypertensive heart disease without heart failure   ? Left ureteral  & renal stone   ? Lumbago 10/01/2021  ? back pain chronic sees dr Lang Snow at dr Reatha Armour office may get back surgery  ? Mixed hyperlipidemia   ? Personal history of COVID-19 08/10/2021  ? fever tired x 1 week took paxlovid all symptoms resolved  ? Plantar fascial fibromatosis   ? not sure when  ? Postoperative atrial fibrillation (HCC)   ? 01-28-2020 NONE SINCE  ? Skin Cancer ([place])   ? Use of cane as ambulatory  aid 10/01/2021  ? prn  ? Uses roller walker 10/01/2021  ? prn  ? UTI (urinary tract infection) 09/16/2021  ? finishes bid bactrim 10-02-2021, then starts daily bactrim for 30 days  ? Wears glasses   ? ? ?Past Surgical History:  ?Procedure Laterality Date  ? AMPUTATION Right 03/27/2014  ? Procedure: DEBRIDEMENT AND CLOSURE RIGHT INDEX FINGER;  Surgeon: [name], MD;  Location: [place];  Service: Orthopedics;  Laterality: Right;  ? BACK SURGERY    ? lower not sure when per wife on 10-01-2021  ? BICEPT TENODESIS  11/26/2011  ? Procedure: BICEPT TENODESIS;  Surgeon: Augustin Schooling, MD;  Location: [place];  Service: Orthopedics;  Laterality: Left;  ? BIOPSY  08/03/2018  ? Procedure: BIOPSY;  Surgeon: Rogene Houston, MD;  [facility];  Service: Endoscopy;;  antral  ? Cataract surgery Bilateral   ? yrs ago per wife on 10-01-2021  ? CHOLECYSTECTOMY N/A 09/22/2018  ? Procedure: LAPAROSCOPIC CHOLECYSTECTOMY;  Surgeon: Virl Cagey, MD;  [facility];  Service: General;  Laterality: N/A;  ? COLONOSCOPY N/A 12/26/2014  ? Procedure: COLONOSCOPY;  Surgeon: Rogene Houston, MD;  [facility];  Service: Endoscopy;  Laterality: N/A;  730  ? CORONARY ARTERY BYPASS GRAFT N/A 10/19/2013  ? Procedure: CORONARY ARTERY BYPASS GRAFTING (CABG);  Surgeon: Rexene Alberts, MD;  Location: [place];  Service: Open Heart Surgery;  Laterality: N/A;  CABG times four using left internal mammary artery and left leg saphenous vein, incision made on right leg but no vein removed  ? CYSTOSCOPY WITH RETROGRADE PYELOGRAM, URETEROSCOPY AND STENT PLACEMENT Left 08/24/2016  ? Procedure: CYSTOSCOPY WITH LEFT RETROGRADE PYELOGRAM, LEFT URETEROSCOPY, BASKET EXTRACTION LEFT URETERAL STONE;  Surgeon: Irine Seal, MD;  [facility];  Service: Urology;  Laterality: Left;  ? CYSTOSCOPY/URETEROSCOPY/HOLMIUM LASER/STENT PLACEMENT Left 10/06/2021  ? Procedure: CYSTOSCOPY LEFT RETROGRADE PYELOGRAM URETEROSCOPY/HOLMIUM LASER/STENT  PLACEMENT;  Surgeon: Irine Seal, MD;  [facility];  Service: Urology;  Laterality: Left;  ? ESOPHAGOGASTRODUODENOSCOPY N/A 08/03/2018  ? Procedure: ESOPHAGOGASTRODUODENOSCOPY (EGD);  Surgeon: Rogene Houston, MD;  [facility];  Service: Endoscopy;  Laterality: N/A;  2:55  ? EXTRACORPOREAL SHOCK WAVE LITHOTRIPSY Right 06/18/2019  ? Procedure: EXTRACORPOREAL SHOCK WAVE LITHOTRIPSY (ESWL);  Surgeon: Lucas Mallow, MD;  [facility];  Service: Urology;  Laterality: Right;  ? INTRAOPERATIVE TRANSESOPHAGEAL ECHOCARDIOGRAM N/A 10/19/2013  ? Procedure: INTRAOPERATIVE TRANSESOPHAGEAL ECHOCARDIOGRAM;  Surgeon: Rexene Alberts, MD;  Location: [place];  Service: Open Heart Surgery;  Laterality: N/A;  ? KYPHOPLASTY N/A 03/27/2021  ? Procedure: Lumbar One and Lumbar Two Kyphoplasty;  Surgeon: Erline Levine, MD;  Location: [place];  Service: Neurosurgery;  Laterality: N/A;  ? left hand carpal tunnel release  04/04/2015  ? LEFT HEART CATHETERIZATION WITH CORONARY ANGIOGRAM N/A 10/09/2013  ? Procedure: LEFT HEART CATHETERIZATION WITH CORONARY ANGIOGRAM;  Surgeon: Burnell Blanks, MD;  [facility];  Service: Cardiovascular;  Laterality: N/A;  ? Left shoulder rotator cuff repair  12/04/2011  ? MOHS procedure  01/06/2021  ? dr Harl Bowie on left hand  ? ORIF PERIPROSTHETIC FRACTURE Left 03/19/2020  ? Procedure: OPEN REDUCTION INTERNAL FIXATION (ORIF) PERIPROSTHETIC FRACTURE LEFT FEMUR WITH FEMORAL REVISION;  Surgeon: Gaynelle Arabian, MD;  [facility];  Service: Orthopedics;  Laterality: Left;  ? TOTAL HIP ARTHROPLASTY Left 01/28/2020  ? Procedure: TOTAL HIP ARTHROPLASTY ANTERIOR APPROACH;  Surgeon: Gaynelle Arabian, MD;  [facility];  Service: Orthopedics;  Laterality: Left;  165mn  ? TOTAL KNEE ARTHROPLASTY Left 04/18/2001  ? traumaticvamputation of finger  2015  ? VASECTOMY    ? yrs ago per wife on 10-01-2021  ? ? ?There were no vitals filed for this visit. ? ?  Subjective Assessment - 10/27/21 1823   ? ? Subjective Pt reports he continues to have back pain; [facility]; states the pain is less since he had cytoscopy to remove the kidney stones.  Pt reports his balance is getting better - is now walking a few steps at home without the walker   ? Patient is accompained by: Family member   wife [name]  ? Pertinent History myasthenia gravis (dx'd Jan. 2021); OA of hip, closed displaced fracture Lt femoral neck, periprosthetic fracture around internal prosthetic Lt hip joint, fracture lumbar vertebra, Lt THA 01-28-20, kyphoplasty 03-27-21, ORIF periprosthetic fracture 03-19-20; lumbar radiculopathy   ? Patient Stated Goals reduce pain and increase strength in legs   ? Currently in Pain? Yes   ? Pain Score 5    ?  Pain Location Back   ? Pain Orientation Left;Lower   ? Pain Descriptors / Indicators Aching;Discomfort;Nagging   ? Pain Type Chronic pain   ? Pain Onset More than a month ago   ? Pain Frequency Constant   ? ?  ?  ? ?  ? ? ? ? ? ? ? ?Aquatic therapy at [place]. 90 degrees ? ?Patient seen for aquatic therapy today.  Treatment took place in water 3.5-4.8 feet deep depending upon activity.  Pt entered and exited ?the pool via step negotiation with use of hand rails with supervision. ? ?Pt performed water walking forwards 18' x 8 reps, backwards 18' x 2 reps and sideways 18' x 4 reps each with UE support barbells (no bar bells used for forward amb. 18' x 4 reps) ? ?Marching in place 10 reps without UE support ? ?Pt performed squats x 10 reps with bil. UE support on pool edge ? ?Pt performed hip extension strengthening bil. LE's - pushed large yellow noodle down toward floor 15 reps each leg; knee flexion strengthening with foot on yellow noodle in standing position 15 reps each leg  ? ?Pt stood with wide BOS, performed trunk rotation holding large yellow noodle 10 reps to each  side  ? ?Standing bil. heel raises 10 reps with UE support on pool edge ? ?Pt performed stepping strategy - forward, side and back 5 reps each leg with UE support on bar bells for assist with balance ? ?Cherlynn Kaiser

## 2021-10-29 ENCOUNTER — Other Ambulatory Visit: Payer: Self-pay

## 2021-10-29 ENCOUNTER — Ambulatory Visit (HOSPITAL_COMMUNITY)
Admission: RE | Admit: 2021-10-29 | Discharge: 2021-10-29 | Disposition: A | Discharge status: Home / Self Care | Payer: Medicare Other | Source: Ambulatory Visit | Attending: Family Medicine | Admitting: Family Medicine

## 2021-10-29 DIAGNOSIS — M810 Age-related osteoporosis without current pathological fracture: Secondary | ICD-10-CM | POA: Insufficient documentation

## 2021-11-02 ENCOUNTER — Ambulatory Visit: Payer: Self-pay | Admitting: Physical Therapy

## 2021-11-02 ENCOUNTER — Encounter: Payer: Self-pay | Admitting: Physical Therapy

## 2021-11-02 ENCOUNTER — Other Ambulatory Visit: Payer: Self-pay

## 2021-11-02 DIAGNOSIS — M25552 Pain in left hip: Secondary | ICD-10-CM

## 2021-11-02 DIAGNOSIS — G8929 Other chronic pain: Secondary | ICD-10-CM | POA: Diagnosis not present

## 2021-11-02 DIAGNOSIS — R2689 Other abnormalities of gait and mobility: Secondary | ICD-10-CM | POA: Diagnosis not present

## 2021-11-02 DIAGNOSIS — R2681 Unsteadiness on feet: Secondary | ICD-10-CM

## 2021-11-02 DIAGNOSIS — M6281 Muscle weakness (generalized): Secondary | ICD-10-CM

## 2021-11-02 DIAGNOSIS — M545 Low back pain, unspecified: Secondary | ICD-10-CM | POA: Diagnosis not present

## 2021-11-02 NOTE — Therapy (Signed)
[place] [facility] ?[place], [place], [address] ?Phone: [phone]   Fax:  [phone] ? ?Physical Therapy Treatment & 10th Visit Progress Note ? ?Progress Note ?Reporting Period 08-19-21 to 11-02-21 ? ?See note below for Objective Data and Assessment of Progress/Goals.  ? ?  ? ?Patient Details  ?Name: Brandon [name] ?MRN: [id number] ?Date of Birth: 12-Dec-1945 ?Referring Provider (PT): Elwin Sleight, DO ? ? ?Encounter Date: 11/02/2021 ? ? PT End of Session - 11/02/21 2003   ? ? Visit Number 10   ? Number of Visits 12   ? Date for PT Re-Evaluation 11/16/21   ? Authorization Type Medicare   ? Authorization Time Period 08-19-21 - 10-16-21;  10-19-21 - 11-16-21   ? PT Start Time 1310   ? PT Stop Time 1355   ? PT Time Calculation (min) 45 min   ? Equipment Utilized During Treatment Other (comment)   pool noodle, small bar bells, aquatic cuffs  ? Activity Tolerance Patient tolerated treatment well   ? Behavior During Therapy [facility] for tasks assessed/performed   ? ?  ?  ? ?  ? ? ?Past Medical History:  ?Diagnosis Date  ? Arthritis   ? oa  ? Coronary atherosclerosis of native coronary artery   ? Multivessel status post CABG March 2015  ? DDD (degenerative disc disease), lumbar   ? sees dr Vertell Limber dr Vertell Limber currently out of office sees dr Reatha Armour  ? Essential hypertension   ? GERD (gastroesophageal reflux disease)   ? Hard of hearing both ears   ? left ear is worst  ? Hemorrhoids   ? History of gout   ? History of kidney stones   ? history of myasthenia Gravis   ? dx Sep 24 2019  ? History of Rocky [place] spotted fever   ? yrs ago per pt wife on 10-01-2021  ? Hypertensive heart disease without heart failure   ? Left ureteral  & renal stone   ? Lumbago 10/01/2021  ? back pain chronic sees dr Lang Snow at dr Reatha Armour office may get back surgery  ? Mixed hyperlipidemia   ? Personal history of COVID-19 08/10/2021  ? fever tired x 1 week took paxlovid all symptoms resolved  ? Plantar  fascial fibromatosis   ? not sure when  ? Postoperative atrial fibrillation (HCC)   ? 01-28-2020 NONE SINCE  ? Skin Cancer ([place])   ? Use of cane as ambulatory aid 10/01/2021  ? prn  ? Uses roller walker 10/01/2021  ? prn  ? UTI (urinary tract infection) 09/16/2021  ? finishes bid bactrim 10-02-2021, then starts daily bactrim for 30 days  ? Wears glasses   ? ? ?Past Surgical History:  ?Procedure Laterality Date  ? AMPUTATION Right 03/27/2014  ? Procedure: DEBRIDEMENT AND CLOSURE RIGHT INDEX FINGER;  Surgeon: [name], MD;  Location: [place];  Service: Orthopedics;  Laterality: Right;  ? BACK SURGERY    ? lower not sure when per wife on 10-01-2021  ? BICEPT TENODESIS  11/26/2011  ? Procedure: BICEPT TENODESIS;  Surgeon: Augustin Schooling, MD;  Location: [place];  Service: Orthopedics;  Laterality: Left;  ? BIOPSY  08/03/2018  ? Procedure: BIOPSY;  Surgeon: Rogene Houston, MD;  [facility];  Service: Endoscopy;;  antral  ? Cataract surgery Bilateral   ? yrs ago per wife on 10-01-2021  ? CHOLECYSTECTOMY N/A 09/22/2018  ? Procedure: LAPAROSCOPIC CHOLECYSTECTOMY;  Surgeon: Virl Cagey,  MD;  [facility];  Service: General;  Laterality: N/A;  ? COLONOSCOPY N/A 12/26/2014  ? Procedure: COLONOSCOPY;  Surgeon: Rogene Houston, MD;  [facility];  Service: Endoscopy;  Laterality: N/A;  730  ? CORONARY ARTERY BYPASS GRAFT N/A 10/19/2013  ? Procedure: CORONARY ARTERY BYPASS GRAFTING (CABG);  Surgeon: Rexene Alberts, MD;  Location: [place];  Service: Open Heart Surgery;  Laterality: N/A;  CABG times four using left internal mammary artery and left leg saphenous vein, incision made on right leg but no vein removed  ? CYSTOSCOPY WITH RETROGRADE PYELOGRAM, URETEROSCOPY AND STENT PLACEMENT Left 08/24/2016  ? Procedure: CYSTOSCOPY WITH LEFT RETROGRADE PYELOGRAM, LEFT URETEROSCOPY, BASKET EXTRACTION LEFT URETERAL STONE;  Surgeon: Irine Seal, MD;  [facility];  Service: Urology;  Laterality: Left;  ?  CYSTOSCOPY/URETEROSCOPY/HOLMIUM LASER/STENT PLACEMENT Left 10/06/2021  ? Procedure: CYSTOSCOPY LEFT RETROGRADE PYELOGRAM URETEROSCOPY/HOLMIUM LASER/STENT PLACEMENT;  Surgeon: Irine Seal, MD;  [facility];  Service: Urology;  Laterality: Left;  ? ESOPHAGOGASTRODUODENOSCOPY N/A 08/03/2018  ? Procedure: ESOPHAGOGASTRODUODENOSCOPY (EGD);  Surgeon: Rogene Houston, MD;  [facility];  Service: Endoscopy;  Laterality: N/A;  2:55  ? EXTRACORPOREAL SHOCK WAVE LITHOTRIPSY Right 06/18/2019  ? Procedure: EXTRACORPOREAL SHOCK WAVE LITHOTRIPSY (ESWL);  Surgeon: Lucas Mallow, MD;  [facility];  Service: Urology;  Laterality: Right;  ? INTRAOPERATIVE TRANSESOPHAGEAL ECHOCARDIOGRAM N/A 10/19/2013  ? Procedure: INTRAOPERATIVE TRANSESOPHAGEAL ECHOCARDIOGRAM;  Surgeon: Rexene Alberts, MD;  Location: [place];  Service: Open Heart Surgery;  Laterality: N/A;  ? KYPHOPLASTY N/A 03/27/2021  ? Procedure: Lumbar One and Lumbar Two Kyphoplasty;  Surgeon: Erline Levine, MD;  Location: [place];  Service: Neurosurgery;  Laterality: N/A;  ? left hand carpal tunnel release  04/04/2015  ? LEFT HEART CATHETERIZATION WITH CORONARY ANGIOGRAM N/A 10/09/2013  ? Procedure: LEFT HEART CATHETERIZATION WITH CORONARY ANGIOGRAM;  Surgeon: Burnell Blanks, MD;  Location: [facility];  Service: Cardiovascular;  Laterality: N/A;  ? Left shoulder rotator cuff repair  12/04/2011  ? MOHS procedure  01/06/2021  ? dr Harl Bowie on left hand  ? ORIF PERIPROSTHETIC FRACTURE Left 03/19/2020  ? Procedure: OPEN REDUCTION INTERNAL FIXATION (ORIF) PERIPROSTHETIC FRACTURE LEFT FEMUR WITH FEMORAL REVISION;  Surgeon: Gaynelle Arabian, MD;  [facility];  Service: Orthopedics;  Laterality: Left;  ? TOTAL HIP ARTHROPLASTY Left 01/28/2020  ? Procedure: TOTAL HIP ARTHROPLASTY ANTERIOR APPROACH;  Surgeon: Gaynelle Arabian, MD;  [facility];  Service: Orthopedics;  Laterality: Left;  135mn  ? TOTAL KNEE ARTHROPLASTY Left  04/18/2001  ? traumaticvamputation of finger  2015  ? VASECTOMY    ? yrs ago per wife on 10-01-2021  ? ? ?There were no vitals filed for this visit. ? ? Subjective Assessment - 11/02/21 1957   ? ? Subjective Pt reports no changes or problems since pool session last Monday; reports still in process of seeking 2nd opinion about his back; reports back pain 8/10 intensity   ? Patient is accompained by: Family member   wife [name]  ? Pertinent History myasthenia gravis (dx'd Jan. 2021); OA of hip, closed displaced fracture Lt femoral neck, periprosthetic fracture around internal prosthetic Lt hip joint, fracture lumbar vertebra, Lt THA 01-28-20, kyphoplasty 03-27-21, ORIF periprosthetic fracture 03-19-20; lumbar radiculopathy   ? Patient Stated Goals reduce pain and increase strength in legs   ? Currently in Pain? Yes   ? Pain Score 8    ? Pain Location Back   ? Pain  Orientation Left;Lower   ? Pain Descriptors / Indicators Aching;Discomfort;Nagging   ? Pain Type Chronic pain   ? Pain Onset More than a month ago   ? Pain Frequency Intermittent   ? ?  ?  ? ?  ? ? ? ? ? ? ?Aquatic therapy at [place]. 90 degrees ? ?Patient seen for aquatic therapy today.  Treatment took place in water 3.5-4.8 feet deep depending upon activity.  Pt entered and exited ?the pool via step negotiation with use of hand rails with supervision. ? ?Pt performed water walking forwards 18' x 4 reps, backwards 18' x 2 reps and sideways 18' x 4 reps each with UE support barbells (no bar bells used for forward amb. 18' x 2 reps) ? ?Marching in place 10 reps with UE support on pool edge prn for balance ? ?Pt performed crossovers in front 18' x 2 reps with min assist for balance recovery - pt held bar bells for assist with balance ? ?Pt performed squats x 10 reps with bil. UE support on pool edge ? ?Standing bil. heel raises 10 reps with UE support on pool edge ? ?Standing hip abduction with use of aquatic cuff 10 reps bil. LE's ; pt performed hip  flexion, extension and marching 10 reps each leg with UE support ? ?Pt performed stepping strategy exercise - stepping forward, back and side 5 reps with each leg each direction; water current produced

## 2021-11-05 DIAGNOSIS — M810 Age-related osteoporosis without current pathological fracture: Secondary | ICD-10-CM | POA: Diagnosis not present

## 2021-11-05 DIAGNOSIS — E663 Overweight: Secondary | ICD-10-CM | POA: Diagnosis not present

## 2021-11-05 DIAGNOSIS — G7000 Myasthenia gravis without (acute) exacerbation: Secondary | ICD-10-CM | POA: Diagnosis not present

## 2021-11-05 DIAGNOSIS — Z6827 Body mass index (BMI) 27.0-27.9, adult: Secondary | ICD-10-CM | POA: Diagnosis not present

## 2021-11-05 DIAGNOSIS — M5136 Other intervertebral disc degeneration, lumbar region: Secondary | ICD-10-CM | POA: Diagnosis not present

## 2021-11-09 ENCOUNTER — Ambulatory Visit: Payer: Medicare Other | Admitting: Physical Therapy

## 2021-11-09 ENCOUNTER — Other Ambulatory Visit: Payer: Self-pay

## 2021-11-09 DIAGNOSIS — M6281 Muscle weakness (generalized): Secondary | ICD-10-CM | POA: Diagnosis not present

## 2021-11-09 DIAGNOSIS — R2681 Unsteadiness on feet: Secondary | ICD-10-CM | POA: Diagnosis not present

## 2021-11-09 DIAGNOSIS — G8929 Other chronic pain: Secondary | ICD-10-CM | POA: Diagnosis not present

## 2021-11-09 DIAGNOSIS — M545 Low back pain, unspecified: Secondary | ICD-10-CM | POA: Diagnosis not present

## 2021-11-09 DIAGNOSIS — R2689 Other abnormalities of gait and mobility: Secondary | ICD-10-CM

## 2021-11-09 DIAGNOSIS — M25552 Pain in left hip: Secondary | ICD-10-CM

## 2021-11-09 NOTE — Therapy (Signed)
[place] [facility] ?[place], [place], [address] ?Phone: [phone]   Fax:  [phone] ? ?Physical Therapy Treatment ? ?Patient Details  ?Name: Brandon [name] ?MRN: [id number] ?Date of Birth: 1945-09-07 ?Referring Provider (PT): Elwin Sleight, DO ? ? ?Encounter Date: 11/09/2021 ? ? PT End of Session - 11/09/21 2018   ? ? Visit Number 11   ? Number of Visits 12   ? Date for PT Re-Evaluation 11/16/21   ? Authorization Type Medicare   ? Authorization Time Period 08-19-21 - 10-16-21;  10-19-21 - 11-16-21   ? PT Start Time 1310   ? PT Stop Time 1355   ? PT Time Calculation (min) 45 min   ? Equipment Utilized During Treatment Other (comment)   pool noodle, small bar bells, aquatic cuffs  ? Activity Tolerance Patient tolerated treatment well   ? Behavior During Therapy [facility] for tasks assessed/performed   ? ?  ?  ? ?  ? ? ?Past Medical History:  ?Diagnosis Date  ? Arthritis   ? oa  ? Coronary atherosclerosis of native coronary artery   ? Multivessel status post CABG March 2015  ? DDD (degenerative disc disease), lumbar   ? sees dr Vertell Limber dr Vertell Limber currently out of office sees dr Reatha Armour  ? Essential hypertension   ? GERD (gastroesophageal reflux disease)   ? Hard of hearing both ears   ? left ear is worst  ? Hemorrhoids   ? History of gout   ? History of kidney stones   ? history of myasthenia Gravis   ? dx Sep 24 2019  ? History of Rocky [place] spotted fever   ? yrs ago per pt wife on 10-01-2021  ? Hypertensive heart disease without heart failure   ? Left ureteral  & renal stone   ? Lumbago 10/01/2021  ? back pain chronic sees dr Lang Snow at dr Reatha Armour office may get back surgery  ? Mixed hyperlipidemia   ? Personal history of COVID-19 08/10/2021  ? fever tired x 1 week took paxlovid all symptoms resolved  ? Plantar fascial fibromatosis   ? not sure when  ? Postoperative atrial fibrillation (HCC)   ? 01-28-2020 NONE SINCE  ? Skin Cancer ([place])   ? Use of cane as ambulatory  aid 10/01/2021  ? prn  ? Uses roller walker 10/01/2021  ? prn  ? UTI (urinary tract infection) 09/16/2021  ? finishes bid bactrim 10-02-2021, then starts daily bactrim for 30 days  ? Wears glasses   ? ? ?Past Surgical History:  ?Procedure Laterality Date  ? AMPUTATION Right 03/27/2014  ? Procedure: DEBRIDEMENT AND CLOSURE RIGHT INDEX FINGER;  Surgeon: [name], MD;  Location: [place];  Service: Orthopedics;  Laterality: Right;  ? BACK SURGERY    ? lower not sure when per wife on 10-01-2021  ? BICEPT TENODESIS  11/26/2011  ? Procedure: BICEPT TENODESIS;  Surgeon: Augustin Schooling, MD;  Location: [place];  Service: Orthopedics;  Laterality: Left;  ? BIOPSY  08/03/2018  ? Procedure: BIOPSY;  Surgeon: Rogene Houston, MD;  [facility];  Service: Endoscopy;;  antral  ? Cataract surgery Bilateral   ? yrs ago per wife on 10-01-2021  ? CHOLECYSTECTOMY N/A 09/22/2018  ? Procedure: LAPAROSCOPIC CHOLECYSTECTOMY;  Surgeon: Virl Cagey, MD;  [facility];  Service: General;  Laterality: N/A;  ? COLONOSCOPY N/A 12/26/2014  ? Procedure: COLONOSCOPY;  Surgeon: Rogene Houston, MD;  [facility];  Service: Endoscopy;  Laterality: N/A;  730  ? CORONARY ARTERY BYPASS GRAFT N/A 10/19/2013  ? Procedure: CORONARY ARTERY BYPASS GRAFTING (CABG);  Surgeon: Rexene Alberts, MD;  Location: [place];  Service: Open Heart Surgery;  Laterality: N/A;  CABG times four using left internal mammary artery and left leg saphenous vein, incision made on right leg but no vein removed  ? CYSTOSCOPY WITH RETROGRADE PYELOGRAM, URETEROSCOPY AND STENT PLACEMENT Left 08/24/2016  ? Procedure: CYSTOSCOPY WITH LEFT RETROGRADE PYELOGRAM, LEFT URETEROSCOPY, BASKET EXTRACTION LEFT URETERAL STONE;  Surgeon: Irine Seal, MD;  [facility];  Service: Urology;  Laterality: Left;  ? CYSTOSCOPY/URETEROSCOPY/HOLMIUM LASER/STENT PLACEMENT Left 10/06/2021  ? Procedure: CYSTOSCOPY LEFT RETROGRADE PYELOGRAM URETEROSCOPY/HOLMIUM LASER/STENT  PLACEMENT;  Surgeon: Irine Seal, MD;  Location: [facility];  Service: Urology;  Laterality: Left;  ? ESOPHAGOGASTRODUODENOSCOPY N/A 08/03/2018  ? Procedure: ESOPHAGOGASTRODUODENOSCOPY (EGD);  Surgeon: Rogene Houston, MD;  [facility];  Service: Endoscopy;  Laterality: N/A;  2:55  ? EXTRACORPOREAL SHOCK WAVE LITHOTRIPSY Right 06/18/2019  ? Procedure: EXTRACORPOREAL SHOCK WAVE LITHOTRIPSY (ESWL);  Surgeon: Lucas Mallow, MD;  [facility];  Service: Urology;  Laterality: Right;  ? INTRAOPERATIVE TRANSESOPHAGEAL ECHOCARDIOGRAM N/A 10/19/2013  ? Procedure: INTRAOPERATIVE TRANSESOPHAGEAL ECHOCARDIOGRAM;  Surgeon: Rexene Alberts, MD;  Location: [place];  Service: Open Heart Surgery;  Laterality: N/A;  ? KYPHOPLASTY N/A 03/27/2021  ? Procedure: Lumbar One and Lumbar Two Kyphoplasty;  Surgeon: Erline Levine, MD;  Location: [place];  Service: Neurosurgery;  Laterality: N/A;  ? left hand carpal tunnel release  04/04/2015  ? LEFT HEART CATHETERIZATION WITH CORONARY ANGIOGRAM N/A 10/09/2013  ? Procedure: LEFT HEART CATHETERIZATION WITH CORONARY ANGIOGRAM;  Surgeon: Burnell Blanks, MD;  [facility]: [facility];  Service: Cardiovascular;  Laterality: N/A;  ? Left shoulder rotator cuff repair  12/04/2011  ? MOHS procedure  01/06/2021  ? dr Harl Bowie on left hand  ? ORIF PERIPROSTHETIC FRACTURE Left 03/19/2020  ? Procedure: OPEN REDUCTION INTERNAL FIXATION (ORIF) PERIPROSTHETIC FRACTURE LEFT FEMUR WITH FEMORAL REVISION;  Surgeon: Gaynelle Arabian, MD;  [facility];  Service: Orthopedics;  Laterality: Left;  ? TOTAL HIP ARTHROPLASTY Left 01/28/2020  ? Procedure: TOTAL HIP ARTHROPLASTY ANTERIOR APPROACH;  Surgeon: Gaynelle Arabian, MD;  [facility];  Service: Orthopedics;  Laterality: Left;  136mn  ? TOTAL KNEE ARTHROPLASTY Left 04/18/2001  ? traumaticvamputation of finger  2015  ? VASECTOMY    ? yrs ago per wife on 10-01-2021  ? ? ?There were no vitals filed for this visit. ? ?  Subjective Assessment - 11/09/21 2015   ? ? Subjective Pt reports he is still waiting to hear [facility]   ? Patient is accompained by: Family member   wife [name]  ? Pertinent History myasthenia gravis (dx'd Jan. 2021); OA of hip, closed displaced fracture Lt femoral neck, periprosthetic fracture around internal prosthetic Lt hip joint, fracture lumbar vertebra, Lt THA 01-28-20, kyphoplasty 03-27-21, ORIF periprosthetic fracture 03-19-20; lumbar radiculopathy   ? Patient Stated Goals reduce pain and increase strength in legs   ? Currently in Pain? Yes   ? Pain Score 5    ? Pain Location Back   ? Pain Orientation Left;Lower   ? Pain Descriptors / Indicators Aching;Discomfort   ? Pain Type Chronic pain   ? Pain Onset More than a month ago   ?  Pain Frequency Intermittent   ? ?  ?  ? ?  ? ? ? ? ? ? ? ?Aquatic therapy at [place]. 90 degrees ? ?Patient seen for aquatic therapy today.  Treatment took place in water 3.5-4.8 feet deep depending upon activity.  Pt entered and exited ?the pool via step negotiation with use of hand rails with supervision. ? ?Pt performed water walking forwards 18' x 6 reps, backwards 18' x 2 reps and sideways 18' x 4 reps each with UE support on barbells ? ?Marching in place 10 reps without UE support ? ?Pt performed squats x 10 reps with bil. UE support on pool edge;  LLE only - unilateral squats 10 reps ? ?Pt performed hip extension strengthening bil. LE's - pushed large yellow noodle down toward floor 15 reps each leg; knee flexion strengthening with foot on yellow noodle in standing position 15 reps each leg  ? ?Pt stood with wide BOS, performed trunk rotation holding large yellow noodle 10 reps to each side  ? ?Pt performed stepping strategy - forward, side and back 5 reps each leg with UE support on bar bells for assist with balance ? ?Standing hip abduction with use of aquatic cuff 10 reps bil. LE's  ; pt performed hip flexion, extension and marching 10 reps each leg with UE support ? ?Pt requires buoyancy of water for support with standing balance and with gait training without UE support; buoyancy of

## 2021-11-13 DIAGNOSIS — H02831 Dermatochalasis of right upper eyelid: Secondary | ICD-10-CM | POA: Diagnosis not present

## 2021-11-13 DIAGNOSIS — G7000 Myasthenia gravis without (acute) exacerbation: Secondary | ICD-10-CM | POA: Diagnosis not present

## 2021-11-13 DIAGNOSIS — Z961 Presence of intraocular lens: Secondary | ICD-10-CM | POA: Diagnosis not present

## 2021-11-13 DIAGNOSIS — H02834 Dermatochalasis of left upper eyelid: Secondary | ICD-10-CM | POA: Diagnosis not present

## 2021-11-16 ENCOUNTER — Encounter: Payer: Self-pay | Admitting: Physical Therapy

## 2021-11-16 ENCOUNTER — Ambulatory Visit: Payer: Medicare Other | Attending: Family Medicine | Admitting: Physical Therapy

## 2021-11-16 DIAGNOSIS — R2681 Unsteadiness on feet: Secondary | ICD-10-CM | POA: Diagnosis not present

## 2021-11-16 DIAGNOSIS — M6281 Muscle weakness (generalized): Secondary | ICD-10-CM | POA: Insufficient documentation

## 2021-11-16 DIAGNOSIS — M545 Low back pain, unspecified: Secondary | ICD-10-CM | POA: Diagnosis not present

## 2021-11-16 DIAGNOSIS — R2689 Other abnormalities of gait and mobility: Secondary | ICD-10-CM | POA: Diagnosis not present

## 2021-11-16 DIAGNOSIS — G8929 Other chronic pain: Secondary | ICD-10-CM | POA: Insufficient documentation

## 2021-11-16 NOTE — Therapy (Signed)
[place] [facility] ?[place], [place], [address] ?Phone: [phone]   Fax:  [phone] ? ?Physical Therapy Treatment ? ?Patient Details  ?Name: Brandon [name] ?MRN: [id number] ?Date of Birth: [birth year] ?Referring Provider (PT): Elwin Sleight, DO ? ? ?Encounter Date: 11/16/2021 ? ? PT End of Session - 11/16/21 2016   ? ? Visit Number 12   ? Number of Visits 15   ? Date for PT Re-Evaluation 12/14/21   ? Authorization Type Medicare   ? Authorization Time Period 08-19-21 - 10-16-21;  10-19-21 - 11-16-21;  11-16-21 - 12-14-21   ? PT Start Time 1310   ? PT Stop Time 1355   ? PT Time Calculation (min) 45 min   ? Equipment Utilized During Treatment Other (comment)   pool noodle, small bar bells, aquatic cuffs  ? Activity Tolerance Patient tolerated treatment well   ? Behavior During Therapy [facility] for tasks assessed/performed   ? ?  ?  ? ?  ? ? ?Past Medical History:  ?Diagnosis Date  ? Arthritis   ? oa  ? Coronary atherosclerosis of native coronary artery   ? Multivessel status post CABG March 2015  ? DDD (degenerative disc disease), lumbar   ? sees dr Vertell Limber dr Vertell Limber currently out of office sees dr Reatha Armour  ? Essential hypertension   ? GERD (gastroesophageal reflux disease)   ? Hard of hearing both ears   ? left ear is worst  ? Hemorrhoids   ? History of gout   ? History of kidney stones   ? history of myasthenia Gravis   ? dx Sep 24 2019  ? History of Rocky Mountain spotted fever   ? yrs ago per pt wife on 10-01-2021  ? Hypertensive heart disease without heart failure   ? Left ureteral  & renal stone   ? Lumbago 10/01/2021  ? back pain chronic sees dr Lang Snow at dr Reatha Armour office may get back surgery  ? Mixed hyperlipidemia   ? Personal history of COVID-19 08/10/2021  ? fever tired x 1 week took paxlovid all symptoms resolved  ? Plantar fascial fibromatosis   ? not sure when  ? Postoperative atrial fibrillation (HCC)   ? 01-28-2020 NONE SINCE  ? Skin Cancer ([place])   ? Use of  cane as ambulatory aid 10/01/2021  ? prn  ? Uses roller walker 10/01/2021  ? prn  ? UTI (urinary tract infection) 09/16/2021  ? finishes bid bactrim 10-02-2021, then starts daily bactrim for 30 days  ? Wears glasses   ? ? ?Past Surgical History:  ?Procedure Laterality Date  ? AMPUTATION Right 03/27/2014  ? Procedure: DEBRIDEMENT AND CLOSURE RIGHT INDEX FINGER;  Surgeon: [name], MD;  Location: [place];  Service: Orthopedics;  Laterality: Right;  ? BACK SURGERY    ? lower not sure when per wife on 10-01-2021  ? BICEPT TENODESIS  11/26/2011  ? Procedure: BICEPT TENODESIS;  Surgeon: Augustin Schooling, MD;  Location: [place];  Service: Orthopedics;  Laterality: Left;  ? BIOPSY  08/03/2018  ? Procedure: BIOPSY;  Surgeon: Rogene Houston, MD;  [facility];  Service: Endoscopy;;  antral  ? Cataract surgery Bilateral   ? yrs ago per wife on 10-01-2021  ? CHOLECYSTECTOMY N/A 09/22/2018  ? Procedure: LAPAROSCOPIC CHOLECYSTECTOMY;  Surgeon: Virl Cagey, MD;  [facility];  Service: General;  Laterality: N/A;  ? COLONOSCOPY N/A 12/26/2014  ? Procedure: COLONOSCOPY;  Surgeon: Mechele Dawley  Laural Golden, MD;  [facility];  Service: Endoscopy;  Laterality: N/A;  730  ? CORONARY ARTERY BYPASS GRAFT N/A 10/19/2013  ? Procedure: CORONARY ARTERY BYPASS GRAFTING (CABG);  Surgeon: Rexene Alberts, MD;  Location: [place];  Service: Open Heart Surgery;  Laterality: N/A;  CABG times four using left internal mammary artery and left leg saphenous vein, incision made on right leg but no vein removed  ? CYSTOSCOPY WITH RETROGRADE PYELOGRAM, URETEROSCOPY AND STENT PLACEMENT Left 08/24/2016  ? Procedure: CYSTOSCOPY WITH LEFT RETROGRADE PYELOGRAM, LEFT URETEROSCOPY, BASKET EXTRACTION LEFT URETERAL STONE;  Surgeon: Irine Seal, MD;  [facility];  Service: Urology;  Laterality: Left;  ? CYSTOSCOPY/URETEROSCOPY/HOLMIUM LASER/STENT PLACEMENT Left 10/06/2021  ? Procedure: CYSTOSCOPY LEFT RETROGRADE PYELOGRAM  URETEROSCOPY/HOLMIUM LASER/STENT PLACEMENT;  Surgeon: Irine Seal, MD;  [facility]: [facility];  Service: Urology;  Laterality: Left;  ? ESOPHAGOGASTRODUODENOSCOPY N/A 08/03/2018  ? Procedure: ESOPHAGOGASTRODUODENOSCOPY (EGD);  Surgeon: Rogene Houston, MD;  [facility];  Service: Endoscopy;  Laterality: N/A;  2:55  ? EXTRACORPOREAL SHOCK WAVE LITHOTRIPSY Right 06/18/2019  ? Procedure: EXTRACORPOREAL SHOCK WAVE LITHOTRIPSY (ESWL);  Surgeon: Lucas Mallow, MD;  [facility];  Service: Urology;  Laterality: Right;  ? INTRAOPERATIVE TRANSESOPHAGEAL ECHOCARDIOGRAM N/A 10/19/2013  ? Procedure: INTRAOPERATIVE TRANSESOPHAGEAL ECHOCARDIOGRAM;  Surgeon: Rexene Alberts, MD;  Location: [place];  Service: Open Heart Surgery;  Laterality: N/A;  ? KYPHOPLASTY N/A 03/27/2021  ? Procedure: Lumbar One and Lumbar Two Kyphoplasty;  Surgeon: Erline Levine, MD;  Location: [place];  Service: Neurosurgery;  Laterality: N/A;  ? left hand carpal tunnel release  04/04/2015  ? LEFT HEART CATHETERIZATION WITH CORONARY ANGIOGRAM N/A 10/09/2013  ? Procedure: LEFT HEART CATHETERIZATION WITH CORONARY ANGIOGRAM;  Surgeon: [place] Blanks, MD;  Location: [facility];  Service: Cardiovascular;  Laterality: N/A;  ? Left shoulder rotator cuff repair  12/04/2011  ? MOHS procedure  01/06/2021  ? dr Harl Bowie on left hand  ? ORIF PERIPROSTHETIC FRACTURE Left 03/19/2020  ? Procedure: OPEN REDUCTION INTERNAL FIXATION (ORIF) PERIPROSTHETIC FRACTURE LEFT FEMUR WITH FEMORAL REVISION;  Surgeon: Gaynelle Arabian, MD;  [facility];  Service: Orthopedics;  Laterality: Left;  ? TOTAL HIP ARTHROPLASTY Left 01/28/2020  ? Procedure: TOTAL HIP ARTHROPLASTY ANTERIOR APPROACH;  Surgeon: Gaynelle Arabian, MD;  [facility];  Service: Orthopedics;  Laterality: Left;  156mn  ? TOTAL KNEE ARTHROPLASTY Left 04/18/2001  ? traumaticvamputation of finger  2015  ? VASECTOMY    ? yrs ago per wife on 10-01-2021  ? ? ?There were no  vitals filed for this visit. ? ? Subjective Assessment - 11/16/21 2014   ? ? [facility]ive [facility] back and to see if he is candidate for surgery.  Pt reports back pain 6/10 today   ? Patient is accompained by: Family member   wife [name]  ? Pertinent History myasthenia gravis (dx'd Jan. 2021); OA of hip, closed displaced fracture Lt femoral neck, periprosthetic fracture around internal prosthetic Lt hip joint, fracture lumbar vertebra, Lt THA 01-28-20, kyphoplasty 03-27-21, ORIF periprosthetic fracture 03-19-20; lumbar radiculopathy   ? Patient Stated Goals reduce pain and increase strength in legs   ? Currently in Pain? Yes   ? Pain Score 6    ? Pain Location Back   ? Pain Orientation Left;Lower;Lateral   ? Pain Descriptors / Indicators Aching;Discomfort   ? Pain Type Chronic pain   ?  Pain Onset More than a month ago   ? Pain Frequency Intermittent   ? ?  ?  ? ?  ? ? ? ? ? ? ?Aquatic therapy at [place]. 90 degrees ? ?Patient seen for aquatic therapy today.  Treatment took place in water 3.5-4.8 feet deep depending upon activity.  Pt entered and exited ?the pool via step negotiation with use of hand rails modified independently. ? ?Pt performed water walking forwards 18' x 4 reps, backwards 18' x 2 reps and sideways 18' x 4 reps each with UE support barbells (no bar bells used for forward amb. 18' x 2 reps) ? ?Marching in place 10 reps without UE support  ? ?Pt performed crossovers in front 18' x 2 reps - pt held bar bells for assist with balance; stepping behind 18' x 2 reps; then progressed to braiding 18'x 2 reps with UE support on bar bells ? ?Pt performed squats x 10 reps with bil. UE support on pool edge ? ?Standing bil. heel raises 10 reps with UE support on pool edge ? ?Standing hip abduction with use of aquatic cuff 10 reps bil. LE's ; pt performed hip flexion, extension and marching 10 reps each leg with UE support ? ?Pt  performed stepping strategy exercise - stepping forward, back and side 5 reps with each leg each direction; water current produced perturbations for challenges with balance ? ?Supine exercises with use of large yellow nood

## 2021-11-30 ENCOUNTER — Ambulatory Visit: Payer: Medicare Other | Admitting: Physical Therapy

## 2021-11-30 DIAGNOSIS — R2689 Other abnormalities of gait and mobility: Secondary | ICD-10-CM | POA: Diagnosis not present

## 2021-11-30 DIAGNOSIS — M6281 Muscle weakness (generalized): Secondary | ICD-10-CM | POA: Diagnosis not present

## 2021-11-30 DIAGNOSIS — R2681 Unsteadiness on feet: Secondary | ICD-10-CM | POA: Diagnosis not present

## 2021-11-30 DIAGNOSIS — G8929 Other chronic pain: Secondary | ICD-10-CM | POA: Diagnosis not present

## 2021-11-30 DIAGNOSIS — M545 Low back pain, unspecified: Secondary | ICD-10-CM | POA: Diagnosis not present

## 2021-12-01 ENCOUNTER — Encounter: Payer: Self-pay | Admitting: Physical Therapy

## 2021-12-01 NOTE — Therapy (Signed)
[facility] ?[place], [place], [address] ?Phone: [phone]   Fax:  [phone] ? ?Physical Therapy Treatment ? ?Patient Details  ?Name: Brandon [name] ?MRN: [id number] ?Date of Birth: [birth year] ?Referring Provider (PT): Elwin Sleight, DO ? ? ?Encounter Date: 11/30/2021 ? ? PT End of Session - 12/01/21 1944   ? ? Visit Number 13   ? Number of Visits 15   ? Date for PT Re-Evaluation 12/14/21   ? Authorization Type Medicare   ? Authorization Time Period 08-19-21 - 10-16-21;  10-19-21 - 11-16-21;  11-16-21 - 12-14-21   ? PT Start Time 1310   ? PT Stop Time 1355   ? PT Time Calculation (min) 45 min   ? Equipment Utilized During Treatment Other (comment)   pool noodle, small bar bells, aquatic cuffs  ? Activity Tolerance Patient tolerated treatment well   ? Behavior During Therapy [facility] for tasks assessed/performed   ? ?  ?  ? ?  ? ? ?Past Medical History:  ?Diagnosis Date  ? Arthritis   ? oa  ? Coronary atherosclerosis of native coronary artery   ? Multivessel status post CABG March 2015  ? DDD (degenerative disc disease), lumbar   ? sees dr Vertell Limber dr Vertell Limber currently out of office sees dr Reatha Armour  ? Essential hypertension   ? GERD (gastroesophageal reflux disease)   ? Hard of hearing both ears   ? left ear is worst  ? Hemorrhoids   ? History of gout   ? History of kidney stones   ? history of myasthenia Gravis   ? dx Sep 24 2019  ? History of Rocky [place] spotted fever   ? yrs ago per pt wife on 10-01-2021  ? Hypertensive heart disease without heart failure   ? Left ureteral  & renal stone   ? Lumbago 10/01/2021  ? back pain chronic sees dr Lang Snow at dr Reatha Armour office may get back surgery  ? Mixed hyperlipidemia   ? Personal history of COVID-19 08/10/2021  ? fever tired x 1 week took paxlovid all symptoms resolved  ? Plantar fascial fibromatosis   ? not sure when  ? Postoperative atrial fibrillation (HCC)   ? 01-28-2020 NONE SINCE  ? Skin Cancer ([place])   ? Use of  cane as ambulatory aid 10/01/2021  ? prn  ? Uses roller walker 10/01/2021  ? prn  ? UTI (urinary tract infection) 09/16/2021  ? finishes bid bactrim 10-02-2021, then starts daily bactrim for 30 days  ? Wears glasses   ? ? ?Past Surgical History:  ?Procedure Laterality Date  ? AMPUTATION Right 03/27/2014  ? Procedure: DEBRIDEMENT AND CLOSURE RIGHT INDEX FINGER;  Surgeon: [name], MD;  Location: [place];  Service: Orthopedics;  Laterality: Right;  ? BACK SURGERY    ? lower not sure when per wife on 10-01-2021  ? BICEPT TENODESIS  11/26/2011  ? Procedure: BICEPT TENODESIS;  Surgeon: Augustin Schooling, MD;  Location: [place];  Service: Orthopedics;  Laterality: Left;  ? BIOPSY  08/03/2018  ? Procedure: BIOPSY;  Surgeon: Rogene Houston, MD;  [facility];  Service: Endoscopy;;  antral  ? Cataract surgery Bilateral   ? yrs ago per wife on 10-01-2021  ? CHOLECYSTECTOMY N/A 09/22/2018  ? Procedure: LAPAROSCOPIC CHOLECYSTECTOMY;  Surgeon: Virl Cagey, MD;  [facility];  Service: General;  Laterality: N/A;  ? COLONOSCOPY N/A 12/26/2014  ? Procedure: COLONOSCOPY;  Surgeon: Mechele Dawley  Laural Golden, MD;  [facility];  Service: Endoscopy;  Laterality: N/A;  730  ? CORONARY ARTERY BYPASS GRAFT N/A 10/19/2013  ? Procedure: CORONARY ARTERY BYPASS GRAFTING (CABG);  Surgeon: Rexene Alberts, MD;  Location: [place];  Service: Open Heart Surgery;  Laterality: N/A;  CABG times four using left internal mammary artery and left leg saphenous vein, incision made on right leg but no vein removed  ? CYSTOSCOPY WITH RETROGRADE PYELOGRAM, URETEROSCOPY AND STENT PLACEMENT Left 08/24/2016  ? Procedure: CYSTOSCOPY WITH LEFT RETROGRADE PYELOGRAM, LEFT URETEROSCOPY, BASKET EXTRACTION LEFT URETERAL STONE;  Surgeon: Irine Seal, MD;  [facility];  Service: Urology;  Laterality: Left;  ? CYSTOSCOPY/URETEROSCOPY/HOLMIUM LASER/STENT PLACEMENT Left 10/06/2021  ? Procedure: CYSTOSCOPY LEFT RETROGRADE PYELOGRAM  URETEROSCOPY/HOLMIUM LASER/STENT PLACEMENT;  Surgeon: Irine Seal, MD;  Location: [facility];  Service: Urology;  Laterality: Left;  ? ESOPHAGOGASTRODUODENOSCOPY N/A 08/03/2018  ? Procedure: ESOPHAGOGASTRODUODENOSCOPY (EGD);  Surgeon: Rogene Houston, MD;  [facility];  Service: Endoscopy;  Laterality: N/A;  2:55  ? EXTRACORPOREAL SHOCK WAVE LITHOTRIPSY Right 06/18/2019  ? Procedure: EXTRACORPOREAL SHOCK WAVE LITHOTRIPSY (ESWL);  Surgeon: Lucas Mallow, MD;  [facility];  Service: Urology;  Laterality: Right;  ? INTRAOPERATIVE TRANSESOPHAGEAL ECHOCARDIOGRAM N/A 10/19/2013  ? Procedure: INTRAOPERATIVE TRANSESOPHAGEAL ECHOCARDIOGRAM;  Surgeon: Rexene Alberts, MD;  Location: [place];  Service: Open Heart Surgery;  Laterality: N/A;  ? KYPHOPLASTY N/A 03/27/2021  ? Procedure: Lumbar One and Lumbar Two Kyphoplasty;  Surgeon: Erline Levine, MD;  Location: [place];  Service: Neurosurgery;  Laterality: N/A;  ? left hand carpal tunnel release  04/04/2015  ? LEFT HEART CATHETERIZATION WITH CORONARY ANGIOGRAM N/A 10/09/2013  ? Procedure: LEFT HEART CATHETERIZATION WITH CORONARY ANGIOGRAM;  Surgeon: Burnell Blanks, MD;  [facility];  Service: Cardiovascular;  Laterality: N/A;  ? Left shoulder rotator cuff repair  12/04/2011  ? MOHS procedure  01/06/2021  ? dr Harl Bowie on left hand  ? ORIF PERIPROSTHETIC FRACTURE Left 03/19/2020  ? Procedure: OPEN REDUCTION INTERNAL FIXATION (ORIF) PERIPROSTHETIC FRACTURE LEFT FEMUR WITH FEMORAL REVISION;  Surgeon: Gaynelle Arabian, MD;  [facility];  Service: Orthopedics;  Laterality: Left;  ? TOTAL HIP ARTHROPLASTY Left 01/28/2020  ? Procedure: TOTAL HIP ARTHROPLASTY ANTERIOR APPROACH;  Surgeon: Gaynelle Arabian, MD;  [facility];  Service: Orthopedics;  Laterality: Left;  14mn  ? TOTAL KNEE ARTHROPLASTY Left 04/18/2001  ? traumaticvamputation of finger  2015  ? VASECTOMY    ? yrs ago per wife on 10-01-2021  ? ? ?There were no  vitals filed for this visit. ? ? Subjective Assessment - 12/01/21 1940   ? ? Subjective Pt reports back pain is about the same - has appt next Monday, the 24th with his kidney MD for follow-up and also to see if there are kidney stones in his right kidney   ? Patient is accompained by: Family member   wife [name]  ? Pertinent History myasthenia gravis (dx'd Jan. 2021); OA of hip, closed displaced fracture Lt femoral neck, periprosthetic fracture around internal prosthetic Lt hip joint, fracture lumbar vertebra, Lt THA 01-28-20, kyphoplasty 03-27-21, ORIF periprosthetic fracture 03-19-20; lumbar radiculopathy   ? Patient Stated Goals reduce pain and increase strength in legs   ? Currently in Pain? Yes   ? Pain Score 7    ? Pain Location Back   ? Pain Orientation Left;Lower;Lateral   ? Pain Descriptors / Indicators Aching;Discomfort   ? Pain Type Chronic pain   ?  Pain Onset More than a month ago   ? Pain Frequency Constant   ? ?  ?  ? ?  ? ? ? ? ? ?Aquatic therapy at [place]. 90 degrees ? ?Patient seen for aquatic therapy today.  Treatment took place in water 3.5-4.8 feet deep depending upon activity.  Pt entered and exited ?the pool via step negotiation with use of hand rails modified independently. ? ?Pt performed water walking forwards 18' x 4 reps, backwards 18' x 2 reps and sideways 18' x 4 reps each without UE support  ? ?Marching in place 10 reps without UE support  ? ?Pt performed crossovers in front 18' x 2 reps - pt held bar bells for assist with balance; stepping behind 18' x 2 reps; then progressed to braiding 18'x 2 reps with UE support on bar bells ? ?Pt performed squats x 10 reps with bil. UE support on pool edge ? ?Standing bil. heel raises 10 reps with UE support on pool edge ? ?Standing hip abduction with use of aquatic cuff 10 reps bil. LE's ; pt performed hip flexion, extension and marching 10 reps each leg with UE support ? ?Pt performed stepping strategy exercise - stepping forward,  back and side 5 reps with each leg each direction; water current produced perturbations for challenges with balance ? ?Supine exercises with use of large yellow noodle for flotation - bicycling LE's 20 reps; hip abdct./adduct

## 2021-12-07 ENCOUNTER — Ambulatory Visit: Payer: Self-pay | Admitting: Physical Therapy

## 2021-12-07 DIAGNOSIS — N200 Calculus of kidney: Secondary | ICD-10-CM | POA: Diagnosis not present

## 2021-12-07 DIAGNOSIS — R8271 Bacteriuria: Secondary | ICD-10-CM | POA: Diagnosis not present

## 2021-12-07 DIAGNOSIS — R351 Nocturia: Secondary | ICD-10-CM | POA: Diagnosis not present

## 2021-12-07 DIAGNOSIS — N281 Cyst of kidney, acquired: Secondary | ICD-10-CM | POA: Diagnosis not present

## 2021-12-07 DIAGNOSIS — N401 Enlarged prostate with lower urinary tract symptoms: Secondary | ICD-10-CM | POA: Diagnosis not present

## 2021-12-10 DIAGNOSIS — I70203 Unspecified atherosclerosis of native arteries of extremities, bilateral legs: Secondary | ICD-10-CM | POA: Diagnosis not present

## 2021-12-10 DIAGNOSIS — L84 Corns and callosities: Secondary | ICD-10-CM | POA: Diagnosis not present

## 2021-12-10 DIAGNOSIS — M79676 Pain in unspecified toe(s): Secondary | ICD-10-CM | POA: Diagnosis not present

## 2021-12-10 DIAGNOSIS — B351 Tinea unguium: Secondary | ICD-10-CM | POA: Diagnosis not present

## 2021-12-11 DIAGNOSIS — M5136 Other intervertebral disc degeneration, lumbar region: Secondary | ICD-10-CM | POA: Diagnosis not present

## 2021-12-11 DIAGNOSIS — M810 Age-related osteoporosis without current pathological fracture: Secondary | ICD-10-CM | POA: Diagnosis not present

## 2021-12-11 DIAGNOSIS — Z96642 Presence of left artificial hip joint: Secondary | ICD-10-CM | POA: Diagnosis not present

## 2021-12-11 DIAGNOSIS — M25552 Pain in left hip: Secondary | ICD-10-CM | POA: Diagnosis not present

## 2021-12-11 DIAGNOSIS — M5416 Radiculopathy, lumbar region: Secondary | ICD-10-CM | POA: Diagnosis not present

## 2021-12-11 DIAGNOSIS — G7000 Myasthenia gravis without (acute) exacerbation: Secondary | ICD-10-CM | POA: Diagnosis not present

## 2021-12-11 DIAGNOSIS — I2510 Atherosclerotic heart disease of native coronary artery without angina pectoris: Secondary | ICD-10-CM | POA: Diagnosis not present

## 2021-12-11 DIAGNOSIS — Z981 Arthrodesis status: Secondary | ICD-10-CM | POA: Diagnosis not present

## 2021-12-14 ENCOUNTER — Ambulatory Visit: Payer: Medicare Other | Attending: Family Medicine | Admitting: Physical Therapy

## 2021-12-14 DIAGNOSIS — M6281 Muscle weakness (generalized): Secondary | ICD-10-CM | POA: Diagnosis not present

## 2021-12-14 DIAGNOSIS — R2689 Other abnormalities of gait and mobility: Secondary | ICD-10-CM | POA: Insufficient documentation

## 2021-12-14 DIAGNOSIS — R2681 Unsteadiness on feet: Secondary | ICD-10-CM | POA: Insufficient documentation

## 2021-12-14 HISTORY — PX: BACK SURGERY: SHX140

## 2021-12-15 ENCOUNTER — Encounter: Payer: Self-pay | Admitting: Physical Therapy

## 2021-12-15 NOTE — Therapy (Signed)
[facility] ?[place], [place], [address] ?Phone: [phone]   Fax:  [phone] ? ?Physical Therapy Treatment ? ?Patient Details  ?Name: Brandon [name] ?MRN: [id number] ?Date of Birth: [birth year] ?Referring Provider (PT): Elwin Sleight, DO ? ? ?Encounter Date: 12/14/2021 ? ? PT End of Session - 12/15/21 1925   ? ? Visit Number 14   ? Number of Visits 15   ? Date for PT Re-Evaluation 12/14/21   ? Authorization Type Medicare   ? Authorization Time Period 08-19-21 - 10-16-21;  10-19-21 - 11-16-21;  11-16-21 - 12-14-21   ? PT Start Time 1310   ? PT Stop Time 1355   ? PT Time Calculation (min) 45 min   ? Equipment Utilized During Treatment Other (comment)   pool noodle, small bar bells, aquatic cuffs  ? Activity Tolerance Patient tolerated treatment well   ? Behavior During Therapy [facility] for tasks assessed/performed   ? ?  ?  ? ?  ? ? ?Past Medical History:  ?Diagnosis Date  ? Arthritis   ? oa  ? Coronary atherosclerosis of native coronary artery   ? Multivessel status post CABG March 2015  ? DDD (degenerative disc disease), lumbar   ? sees dr Vertell Limber dr Vertell Limber currently out of office sees dr Reatha Armour  ? Essential hypertension   ? GERD (gastroesophageal reflux disease)   ? Hard of hearing both ears   ? left ear is worst  ? Hemorrhoids   ? History of gout   ? History of kidney stones   ? history of myasthenia Gravis   ? dx Sep 24 2019  ? History of Rocky Mountain spotted fever   ? yrs ago per pt wife on 10-01-2021  ? Hypertensive heart disease without heart failure   ? Left ureteral  & renal stone   ? Lumbago 10/01/2021  ? back pain chronic sees dr Lang Snow at dr Reatha Armour office may get back surgery  ? Mixed hyperlipidemia   ? Personal history of COVID-19 08/10/2021  ? fever tired x 1 week took paxlovid all symptoms resolved  ? Plantar fascial fibromatosis   ? not sure when  ? Postoperative atrial fibrillation (HCC)   ? 01-28-2020 NONE SINCE  ? Skin Cancer ([place])   ? Use of  cane as ambulatory aid 10/01/2021  ? prn  ? Uses roller walker 10/01/2021  ? prn  ? UTI (urinary tract infection) 09/16/2021  ? finishes bid bactrim 10-02-2021, then starts daily bactrim for 30 days  ? Wears glasses   ? ? ?Past Surgical History:  ?Procedure Laterality Date  ? AMPUTATION Right 03/27/2014  ? Procedure: DEBRIDEMENT AND CLOSURE RIGHT INDEX FINGER;  Surgeon: [name], MD;  Location: [place];  Service: Orthopedics;  Laterality: Right;  ? BACK SURGERY    ? lower not sure when per wife on 10-01-2021  ? BICEPT TENODESIS  11/26/2011  ? Procedure: BICEPT TENODESIS;  Surgeon: Augustin Schooling, MD;  Location: [place];  Service: Orthopedics;  Laterality: Left;  ? BIOPSY  08/03/2018  ? Procedure: BIOPSY;  Surgeon: Rogene Houston, MD;  [facility];  Service: Endoscopy;;  antral  ? Cataract surgery Bilateral   ? yrs ago per wife on 10-01-2021  ? CHOLECYSTECTOMY N/A 09/22/2018  ? Procedure: LAPAROSCOPIC CHOLECYSTECTOMY;  Surgeon: Virl Cagey, MD;  [facility];  Service: General;  Laterality: N/A;  ? COLONOSCOPY N/A 12/26/2014  ? Procedure: COLONOSCOPY;  Surgeon: Mechele Dawley  Laural Golden, MD;  [facility];  Service: Endoscopy;  Laterality: N/A;  730  ? CORONARY ARTERY BYPASS GRAFT N/A 10/19/2013  ? Procedure: CORONARY ARTERY BYPASS GRAFTING (CABG);  Surgeon: Rexene Alberts, MD;  Location: [place];  Service: Open Heart Surgery;  Laterality: N/A;  CABG times four using left internal mammary artery and left leg saphenous vein, incision made on right leg but no vein removed  ? CYSTOSCOPY WITH RETROGRADE PYELOGRAM, URETEROSCOPY AND STENT PLACEMENT Left 08/24/2016  ? Procedure: CYSTOSCOPY WITH LEFT RETROGRADE PYELOGRAM, LEFT URETEROSCOPY, BASKET EXTRACTION LEFT URETERAL STONE;  Surgeon: Irine Seal, MD;  [facility];  Service: Urology;  Laterality: Left;  ? CYSTOSCOPY/URETEROSCOPY/HOLMIUM LASER/STENT PLACEMENT Left 10/06/2021  ? Procedure: CYSTOSCOPY LEFT RETROGRADE PYELOGRAM  URETEROSCOPY/HOLMIUM LASER/STENT PLACEMENT;  Surgeon: Irine Seal, MD;  Location: [facility];  Service: Urology;  Laterality: Left;  ? ESOPHAGOGASTRODUODENOSCOPY N/A 08/03/2018  ? Procedure: ESOPHAGOGASTRODUODENOSCOPY (EGD);  Surgeon: Rogene Houston, MD;  [facility];  Service: Endoscopy;  Laterality: N/A;  2:55  ? EXTRACORPOREAL SHOCK WAVE LITHOTRIPSY Right 06/18/2019  ? Procedure: EXTRACORPOREAL SHOCK WAVE LITHOTRIPSY (ESWL);  Surgeon: Lucas Mallow, MD;  [facility];  Service: Urology;  Laterality: Right;  ? INTRAOPERATIVE TRANSESOPHAGEAL ECHOCARDIOGRAM N/A 10/19/2013  ? Procedure: INTRAOPERATIVE TRANSESOPHAGEAL ECHOCARDIOGRAM;  Surgeon: Rexene Alberts, MD;  Location: [place];  Service: Open Heart Surgery;  Laterality: N/A;  ? KYPHOPLASTY N/A 03/27/2021  ? Procedure: Lumbar One and Lumbar Two Kyphoplasty;  Surgeon: Erline Levine, MD;  Location: [place];  Service: Neurosurgery;  Laterality: N/A;  ? left hand carpal tunnel release  04/04/2015  ? LEFT HEART CATHETERIZATION WITH CORONARY ANGIOGRAM N/A 10/09/2013  ? Procedure: LEFT HEART CATHETERIZATION WITH CORONARY ANGIOGRAM;  Surgeon: Burnell Blanks, MD;  Location: [facility] CATH LAB;  Service: Cardiovascular;  Laterality: N/A;  ? Left shoulder rotator cuff repair  12/04/2011  ? MOHS procedure  01/06/2021  ? dr Harl Bowie on left hand  ? ORIF PERIPROSTHETIC FRACTURE Left 03/19/2020  ? Procedure: OPEN REDUCTION INTERNAL FIXATION (ORIF) PERIPROSTHETIC FRACTURE LEFT FEMUR WITH FEMORAL REVISION;  Surgeon: Gaynelle Arabian, MD;  [facility];  Service: Orthopedics;  Laterality: Left;  ? TOTAL HIP ARTHROPLASTY Left 01/28/2020  ? Procedure: TOTAL HIP ARTHROPLASTY ANTERIOR APPROACH;  Surgeon: Gaynelle Arabian, MD;  [facility];  Service: Orthopedics;  Laterality: Left;  181mn  ? TOTAL KNEE ARTHROPLASTY Left 04/18/2001  ? traumaticvamputation of finger  2015  ? VASECTOMY    ? yrs ago per wife on 10-01-2021  ? ? ?There were no  vitals filed for this visit. ? ? Subjective Assessment - 12/15/21 1923   ? ? [facility] he can have surgery on his back; had appt last Friday and they did x-rays   ? Patient is accompained by: Family member   wife [name]  ? Pertinent History myasthenia gravis (dx'd Jan. 2021); OA of hip, closed displaced fracture Lt femoral neck, periprosthetic fracture around internal prosthetic Lt hip joint, fracture lumbar vertebra, Lt THA 01-28-20, kyphoplasty 03-27-21, ORIF periprosthetic fracture 03-19-20; lumbar radiculopathy   ? Patient Stated Goals reduce pain and increase strength in legs   ? Currently in Pain? Yes   ? Pain Score 6    ? Pain Location Back   ? Pain Orientation Left;Lateral;Lower   ? Pain Descriptors / Indicators Aching;Discomfort   ? Pain Type Chronic pain   ?  Pain Onset More than a month ago   ? Pain Frequency Constant   ? ?  ?  ? ?  ? ? ? ? ? ? ?Aquatic therapy at [place]. 90 degrees ? ?Patient seen for aquatic therapy today.  Treatment took place in water 3.5-4.8 feet deep depending upon activity.  Pt entered and exited ?the pool via step negotiation with use of hand rails modified independently. ? ?Pt performed water walking forwards 18' x 4 reps, backwards 18' x 2 reps and sideways 18' x 4 reps each with UE support barbells (no bar bells used for forward amb. 18' x 2 reps) ? ?Marching in place 10 reps without UE support  ? ?Pt performed crossovers in front 18' x 2 reps - pt held bar bells for assist with balance; stepping behind 18' x 2 reps ? ?Pt performed squats x 10 reps with bil. UE support on pool edge ? ?Standing bil. heel raises 10 reps with UE support on pool edge ? ?Standing hip abduction with use of aquatic cuff 10 reps bil. LE's ; pt performed hip flexion, extension and marching 10 reps each leg with UE support ? ?Pt performed closed chain strengthening exercise for hip extensor strengthening -  pushing yellow noodle down toward floor 10 reps each leg; then knee flexion/extension 10 reps each leg with foot on yellow noodle for resistance ? ?Supine exercises with use of large yellow noodle for flotation - bicycling LE'

## 2021-12-17 ENCOUNTER — Telehealth: Payer: Self-pay | Admitting: Cardiology

## 2021-12-17 DIAGNOSIS — M810 Age-related osteoporosis without current pathological fracture: Secondary | ICD-10-CM | POA: Diagnosis not present

## 2021-12-17 DIAGNOSIS — Z981 Arthrodesis status: Secondary | ICD-10-CM | POA: Diagnosis not present

## 2021-12-17 DIAGNOSIS — Z7952 Long term (current) use of systemic steroids: Secondary | ICD-10-CM | POA: Diagnosis not present

## 2021-12-17 DIAGNOSIS — I2510 Atherosclerotic heart disease of native coronary artery without angina pectoris: Secondary | ICD-10-CM | POA: Diagnosis not present

## 2021-12-17 DIAGNOSIS — Z96642 Presence of left artificial hip joint: Secondary | ICD-10-CM | POA: Diagnosis not present

## 2021-12-17 DIAGNOSIS — Z8673 Personal history of transient ischemic attack (TIA), and cerebral infarction without residual deficits: Secondary | ICD-10-CM | POA: Diagnosis not present

## 2021-12-17 DIAGNOSIS — Z87310 Personal history of (healed) osteoporosis fracture: Secondary | ICD-10-CM | POA: Diagnosis not present

## 2021-12-17 DIAGNOSIS — G9589 Other specified diseases of spinal cord: Secondary | ICD-10-CM | POA: Diagnosis not present

## 2021-12-17 DIAGNOSIS — M5116 Intervertebral disc disorders with radiculopathy, lumbar region: Secondary | ICD-10-CM | POA: Diagnosis not present

## 2021-12-17 DIAGNOSIS — Z87891 Personal history of nicotine dependence: Secondary | ICD-10-CM | POA: Diagnosis not present

## 2021-12-17 DIAGNOSIS — G7000 Myasthenia gravis without (acute) exacerbation: Secondary | ICD-10-CM | POA: Diagnosis not present

## 2021-12-17 NOTE — Telephone Encounter (Signed)
? ?  Pre-operative Risk Assessment  ?  ?Patient Name: Brandon [name]  ?DOB: [birth year] ?MRN: [id number]  ? ?  ? ?Request for Surgical Clearance   ? ?Procedure:   Left L2-3 Lumbar Microdiscectomy ? ?Date of Surgery:  Clearance 12/29/21                              ?   ?Surgeon:  Dr. Harrison Mons ?Surgeon's Group or Practice Name:  [facility]  ?Phone number:  [phone] ?Fax number:  [phone] ?  ?Type of Clearance Requested:   ?Both Medical and, Pharmacy  ?  ?Type of Anesthesia:  General  ?  ?Additional requests/questions:   ? ?Signed, ?Malanie C Hildebrandt   ?12/17/2021, 4:15 PM  ? ?

## 2021-12-17 NOTE — Telephone Encounter (Signed)
Hi. Dr. Domenic Polite. [name] has upcoming spinal surgery planned for 12/29/2021. He has a history of CABG in 2015. You last saw him in 07/2021 at which time it sounds like he mentioned need for possible surgery. Lexiscan Myoview was ordered and was low risk with evidence of prior infarction with very mild peri-infarct ischemia. He was felt to be OK to proceed with surgery. We can call him to make sure he has not had any new symptoms since his last visit. But can you please comment on how long patient can hold Aspirin prior to this? ? ?Please route response back to P CV DIV PREOP. ? ?Thank you! ?Khyleigh Furney ?

## 2021-12-17 NOTE — Telephone Encounter (Signed)
? ? ?  Patient Name: Brandon [name]  ?DOB: Sep 02, 1945 ?MRN: [id number] ? ?Primary Cardiologist: Rozann Lesches, MD ? ?Chart reviewed as part of pre-operative protocol coverage. Patient was last seen by Dr. Domenic Polite in 07/2021 at which time he mentioned need for possible back surgery. Lexiscan Myoview as ordered for further evaluation and was low risk. I contacted patient today for further pre-op evaluation and he reported doing well since recent office visit and stress test. He denies any chest pain, shortness of breath, acute CHF symptoms, palpitations, or syncope. Given past medical history and time since last visit, based on ACC/AHA guidelines, [name] would be at acceptable risk for the planned procedure without further cardiovascular testing.  ? ?Per Dr. Domenic Polite, [place] to hold Aspirin for 7 days prior to procedure. Please restart this as soon as safely possible postoperatively. ? ?I will route this recommendation to the requesting party via Epic fax function and remove from pre-op pool. ? ?Please call with questions. ? ?Darreld Mclean, PA-C ?12/17/2021, 4:53 PM ? ?

## 2021-12-21 ENCOUNTER — Ambulatory Visit: Payer: Self-pay | Admitting: Physical Therapy

## 2021-12-21 DIAGNOSIS — R2689 Other abnormalities of gait and mobility: Secondary | ICD-10-CM

## 2021-12-21 DIAGNOSIS — R2681 Unsteadiness on feet: Secondary | ICD-10-CM

## 2021-12-21 DIAGNOSIS — M6281 Muscle weakness (generalized): Secondary | ICD-10-CM | POA: Diagnosis not present

## 2021-12-22 NOTE — Therapy (Signed)
[facility] ?[place], [place], [address] ?Phone: [phone]   Fax:  [phone] ? ?Physical Therapy Treatment ? ?Patient Details  ?Name: Brandon [name] ?MRN: [id number] ?Date of Birth: [birth year] ?Referring Provider (PT): Brandon Sleight, DO ? ? ?Encounter Date: 12/21/2021 ? ? PT End of Session - 12/22/21 2043   ? ? Visit Number 15   ? Number of Visits 15   ? Date for PT Re-Evaluation 12/14/21   ? Authorization Type Medicare   ? Authorization Time Period 08-19-21 - 10-16-21;  10-19-21 - 11-16-21;  11-16-21 - 12-14-21   ? PT Start Time 1210   ? PT Stop Time 1300   ? PT Time Calculation (min) 50 min   ? Equipment Utilized During Treatment Other (comment)   pool noodle, small bar bells, aquatic cuffs  ? Activity Tolerance Patient tolerated treatment well   ? Behavior During Therapy [facility] for tasks assessed/performed   ? ?  ?  ? ?  ? ? ?Past Medical History:  ?Diagnosis Date  ? Arthritis   ? oa  ? Coronary atherosclerosis of native coronary artery   ? Multivessel status post CABG March 2015  ? DDD (degenerative disc disease), lumbar   ? sees dr Vertell Limber dr Vertell Limber currently out of office sees dr Reatha Armour  ? Essential hypertension   ? GERD (gastroesophageal reflux disease)   ? Hard of hearing both ears   ? left ear is worst  ? Hemorrhoids   ? History of gout   ? History of kidney stones   ? history of myasthenia Gravis   ? dx Sep 24 2019  ? History of [place] spotted fever   ? yrs ago per pt wife on 10-01-2021  ? Hypertensive heart disease without heart failure   ? Left ureteral  & renal stone   ? Lumbago 10/01/2021  ? back pain chronic sees dr Lang Snow at dr Reatha Armour office may get back surgery  ? Mixed hyperlipidemia   ? Personal history of COVID-19 08/10/2021  ? fever tired x 1 week took paxlovid all symptoms resolved  ? Plantar fascial fibromatosis   ? not sure when  ? Postoperative atrial fibrillation (HCC)   ? 01-28-2020 NONE SINCE  ? Skin Cancer ([place])   ? Use of  cane as ambulatory aid 10/01/2021  ? prn  ? Uses roller walker 10/01/2021  ? prn  ? UTI (urinary tract infection) 09/16/2021  ? finishes bid bactrim 10-02-2021, then starts daily bactrim for 30 days  ? Wears glasses   ? ? ?Past Surgical History:  ?Procedure Laterality Date  ? AMPUTATION Right 03/27/2014  ? Procedure: DEBRIDEMENT AND CLOSURE RIGHT INDEX FINGER;  Surgeon: [name], MD;  Location: [place];  Service: Orthopedics;  Laterality: Right;  ? BACK SURGERY    ? lower not sure when per wife on 10-01-2021  ? BICEPT TENODESIS  11/26/2011  ? Procedure: BICEPT TENODESIS;  Surgeon: Augustin Schooling, MD;  Location: [place];  Service: Orthopedics;  Laterality: Left;  ? BIOPSY  08/03/2018  ? Procedure: BIOPSY;  Surgeon: Rogene Houston, MD;  [facility];  Service: Endoscopy;;  antral  ? Cataract surgery Bilateral   ? yrs ago per wife on 10-01-2021  ? CHOLECYSTECTOMY N/A 09/22/2018  ? Procedure: LAPAROSCOPIC CHOLECYSTECTOMY;  Surgeon: Virl Cagey, MD;  [facility];  Service: General;  Laterality: N/A;  ? COLONOSCOPY N/A 12/26/2014  ? Procedure: COLONOSCOPY;  Surgeon: Mechele Dawley  Laural Golden, MD;  [facility];  Service: Endoscopy;  Laterality: N/A;  730  ? CORONARY ARTERY BYPASS GRAFT N/A 10/19/2013  ? Procedure: CORONARY ARTERY BYPASS GRAFTING (CABG);  Surgeon: Rexene Alberts, MD;  Location: [place];  Service: Open Heart Surgery;  Laterality: N/A;  CABG times four using left internal mammary artery and left leg saphenous vein, incision made on right leg but no vein removed  ? CYSTOSCOPY WITH RETROGRADE PYELOGRAM, URETEROSCOPY AND STENT PLACEMENT Left 08/24/2016  ? Procedure: CYSTOSCOPY WITH LEFT RETROGRADE PYELOGRAM, LEFT URETEROSCOPY, BASKET EXTRACTION LEFT URETERAL STONE;  Surgeon: Irine Seal, MD;  [facility];  Service: Urology;  Laterality: Left;  ? CYSTOSCOPY/URETEROSCOPY/HOLMIUM LASER/STENT PLACEMENT Left 10/06/2021  ? Procedure: CYSTOSCOPY LEFT RETROGRADE PYELOGRAM  URETEROSCOPY/HOLMIUM LASER/STENT PLACEMENT;  Surgeon: Irine Seal, MD;  Location: [facility];  Service: Urology;  Laterality: Left;  ? ESOPHAGOGASTRODUODENOSCOPY N/A 08/03/2018  ? Procedure: ESOPHAGOGASTRODUODENOSCOPY (EGD);  Surgeon: Rogene Houston, MD;  [facility];  Service: Endoscopy;  Laterality: N/A;  2:55  ? EXTRACORPOREAL SHOCK WAVE LITHOTRIPSY Right 06/18/2019  ? Procedure: EXTRACORPOREAL SHOCK WAVE LITHOTRIPSY (ESWL);  Surgeon: Lucas Mallow, MD;  [facility];  Service: Urology;  Laterality: Right;  ? INTRAOPERATIVE TRANSESOPHAGEAL ECHOCARDIOGRAM N/A 10/19/2013  ? Procedure: INTRAOPERATIVE TRANSESOPHAGEAL ECHOCARDIOGRAM;  Surgeon: Rexene Alberts, MD;  Location: [place];  Service: Open Heart Surgery;  Laterality: N/A;  ? KYPHOPLASTY N/A 03/27/2021  ? Procedure: Lumbar One and Lumbar Two Kyphoplasty;  Surgeon: Erline Levine, MD;  Location: [place];  Service: Neurosurgery;  Laterality: N/A;  ? left hand carpal tunnel release  04/04/2015  ? LEFT HEART CATHETERIZATION WITH CORONARY ANGIOGRAM N/A 10/09/2013  ? Procedure: LEFT HEART CATHETERIZATION WITH CORONARY ANGIOGRAM;  Surgeon: Burnell Blanks, MD;  Location: [facility];  Service: Cardiovascular;  Laterality: N/A;  ? Left shoulder rotator cuff repair  12/04/2011  ? MOHS procedure  01/06/2021  ? dr Harl Bowie on left hand  ? ORIF PERIPROSTHETIC FRACTURE Left 03/19/2020  ? Procedure: OPEN REDUCTION INTERNAL FIXATION (ORIF) PERIPROSTHETIC FRACTURE LEFT FEMUR WITH FEMORAL REVISION;  Surgeon: Gaynelle Arabian, MD;  [facility];  Service: Orthopedics;  Laterality: Left;  ? TOTAL HIP ARTHROPLASTY Left 01/28/2020  ? Procedure: TOTAL HIP ARTHROPLASTY ANTERIOR APPROACH;  Surgeon: Gaynelle Arabian, MD;  [facility];  Service: Orthopedics;  Laterality: Left;  152mn  ? TOTAL KNEE ARTHROPLASTY Left 04/18/2001  ? traumaticvamputation of finger  2015  ? VASECTOMY    ? yrs ago per wife on 10-01-2021  ? ? ?There were no  vitals filed for this visit. ? ? Subjective Assessment - 12/22/21 2041   ? ? Subjective Pt reports he has surgery scheduled on his back next Tuesday, May 16; would like to get more aquatic therapy after he heals if doctor says it is OK   ? Patient is accompained by: Family member   [name]  ? Pertinent History myasthenia gravis (dx'd Jan. 2021); OA of hip, closed displaced fracture Lt femoral neck, periprosthetic fracture around internal prosthetic Lt hip joint, fracture lumbar vertebra, Lt THA 01-28-20, kyphoplasty 03-27-21, ORIF periprosthetic fracture 03-19-20; lumbar radiculopathy   ? Patient Stated Goals reduce pain and increase strength in legs   ? Currently in Pain? Yes   ? Pain Score 7    ? Pain Location Back   ? Pain Orientation Left;Lateral;Lower   ? Pain Descriptors / Indicators Aching;Discomfort   ? Pain Type Chronic pain   ? Pain Onset  More than a month ago   ? Pain Frequency Constant   ? ?  ?  ? ?  ? ? ? ? ? ? ? ? ? ? ?Aquatic therapy at [facility]. 90 degrees ? ?Patient seen for aquatic therapy today.  Treatment took place in water 3.5-4.8 feet deep depending upon activity.  Pt entered and exited ?the pool via step negotiation with use of hand rails modified independently. ? ?Pt performed water walking forwards 18' x 4 reps, backwards 18' x 2 reps and sideways 18' x 4 reps each with UE support barbells (no bar bells used for forward amb. 18' x 2 reps) ? ?Marching in place 10 reps without UE support  ? ?Pt performed crossovers in front 18' x 2 reps - pt held bar bells for assist with balance; stepping behind 18' x 2 reps ? ?Pt performed squats x 10 reps with bil. UE support on pool edge ? ?Standing bil. heel raises 10 reps with UE support on pool edge ? ?Standing hip abduction with use of aquatic cuff 10 reps bil. LE's ; pt performed hip flexion, extension and marching 10 reps each leg with UE support ? ?Pt performed closed chain strengthening exercise for hip extensor strengthening -  pushing yellow noodle down toward floor 10 reps each leg; then knee flexion/extension 10 reps each leg with foot on yellow noodle for resistance ? ?Supine exercises with use of large yellow noodle for flotation - bicycling LE's 2

## 2021-12-25 DIAGNOSIS — Z79899 Other long term (current) drug therapy: Secondary | ICD-10-CM | POA: Diagnosis not present

## 2021-12-25 DIAGNOSIS — M5116 Intervertebral disc disorders with radiculopathy, lumbar region: Secondary | ICD-10-CM | POA: Diagnosis not present

## 2021-12-25 DIAGNOSIS — Z87891 Personal history of nicotine dependence: Secondary | ICD-10-CM | POA: Diagnosis not present

## 2021-12-25 DIAGNOSIS — K219 Gastro-esophageal reflux disease without esophagitis: Secondary | ICD-10-CM | POA: Diagnosis not present

## 2021-12-25 DIAGNOSIS — I2510 Atherosclerotic heart disease of native coronary artery without angina pectoris: Secondary | ICD-10-CM | POA: Diagnosis not present

## 2021-12-25 DIAGNOSIS — M7138 Other bursal cyst, other site: Secondary | ICD-10-CM | POA: Diagnosis not present

## 2021-12-25 DIAGNOSIS — Z7982 Long term (current) use of aspirin: Secondary | ICD-10-CM | POA: Diagnosis not present

## 2021-12-25 DIAGNOSIS — I4891 Unspecified atrial fibrillation: Secondary | ICD-10-CM | POA: Diagnosis not present

## 2021-12-25 DIAGNOSIS — Z951 Presence of aortocoronary bypass graft: Secondary | ICD-10-CM | POA: Diagnosis not present

## 2021-12-29 DIAGNOSIS — I4891 Unspecified atrial fibrillation: Secondary | ICD-10-CM | POA: Diagnosis not present

## 2021-12-29 DIAGNOSIS — M5116 Intervertebral disc disorders with radiculopathy, lumbar region: Secondary | ICD-10-CM | POA: Diagnosis not present

## 2021-12-29 DIAGNOSIS — Z981 Arthrodesis status: Secondary | ICD-10-CM | POA: Diagnosis not present

## 2021-12-29 DIAGNOSIS — M7138 Other bursal cyst, other site: Secondary | ICD-10-CM | POA: Diagnosis not present

## 2021-12-29 DIAGNOSIS — Z79899 Other long term (current) drug therapy: Secondary | ICD-10-CM | POA: Diagnosis not present

## 2021-12-29 DIAGNOSIS — K219 Gastro-esophageal reflux disease without esophagitis: Secondary | ICD-10-CM | POA: Diagnosis not present

## 2021-12-29 DIAGNOSIS — I2510 Atherosclerotic heart disease of native coronary artery without angina pectoris: Secondary | ICD-10-CM | POA: Diagnosis not present

## 2022-01-03 NOTE — Progress Notes (Signed)
Cardiology Office Note  Date: 01/04/2022   ID: [name], Brandon 08-17-1945, MRN [id number]  PCP:  Sharilyn Sites, MD  Cardiologist:  Rozann Lesches, MD Electrophysiologist:  None   Chief Complaint  Patient presents with   Cardiac follow-up    History of Present Illness: Brandon [name] is a 76 y.o. male last seen in December 2022.  He is here for a follow-up visit with his wife.  Recently status post lumbar laminectomy and discectomy at [facility].  From a cardiac perspective, he is doing well without active angina on medical therapy.  Lexiscan Myoview in February was low risk and consistent with previous inferior/inferoseptal infarct scar and very mild peri-infarct ischemia, LVEF 57%.  I personally reviewed his ECG today which shows sinus bradycardia with occasional PVCs, old inferior infarct pattern.  I reviewed his medications which are stable from a cardiac perspective.  He was off aspirin temporarily for surgery.  Past Medical History:  Diagnosis Date   Arthritis    Coronary atherosclerosis of native coronary artery    Multivessel status post CABG March 2015   DDD (degenerative disc disease), lumbar    Essential hypertension    GERD (gastroesophageal reflux disease)    Hard of hearing both ears    Left ear is worst   Hemorrhoids    History of gout    History of kidney stones    history of myasthenia Gravis 2021   History of [facility] spotted fever    Left ureteral  & renal stone    Lumbago    Mixed hyperlipidemia    Personal history of COVID-19 08/10/2021   Paxlovid   Plantar fascial fibromatosis    Postoperative atrial fibrillation (HCC)    Skin Cancer (HCC)    Use of cane as ambulatory aid    Uses roller walker    UTI (urinary tract infection) 09/16/2021   Wears glasses     Past Surgical History:  Procedure Laterality Date   AMPUTATION Right 03/27/2014   Procedure: DEBRIDEMENT AND CLOSURE RIGHT INDEX FINGER;  Surgeon: [name], MD;   Location: [place];  Service: Orthopedics;  Laterality: Right;   BACK SURGERY     lower not sure when per wife on 10-01-2021   BICEPT TENODESIS  11/26/2011   Procedure: BICEPT TENODESIS;  Surgeon: Augustin Schooling, MD;  Location: [place];  Service: Orthopedics;  Laterality: Left;   BIOPSY  08/03/2018   Procedure: BIOPSY;  Surgeon: Rogene Houston, MD;  [facility];  Service: Endoscopy;;  antral   Cataract surgery Bilateral    yrs ago per wife on 10-01-2021   CHOLECYSTECTOMY N/A 09/22/2018   Procedure: LAPAROSCOPIC CHOLECYSTECTOMY;  Surgeon: Virl Cagey, MD;  [facility];  Service: General;  Laterality: N/A;   COLONOSCOPY N/A 12/26/2014   Procedure: COLONOSCOPY;  Surgeon: Rogene Houston, MD;  [facility];  Service: Endoscopy;  Laterality: N/A;  730   CORONARY ARTERY BYPASS GRAFT N/A 10/19/2013   Procedure: CORONARY ARTERY BYPASS GRAFTING (CABG);  Surgeon: Rexene Alberts, MD;  Location: [place];  Service: Open Heart Surgery;  Laterality: N/A;  CABG times four using left internal mammary artery and left leg saphenous vein, incision made on right leg but no vein removed   CYSTOSCOPY WITH RETROGRADE PYELOGRAM, URETEROSCOPY AND STENT PLACEMENT Left 08/24/2016   Procedure: CYSTOSCOPY WITH LEFT RETROGRADE PYELOGRAM, LEFT URETEROSCOPY, BASKET EXTRACTION LEFT URETERAL STONE;  Surgeon: Irine Seal, MD;  Location[facility];  Service: Urology;  Laterality: Left;   CYSTOSCOPY/URETEROSCOPY/HOLMIUM LASER/STENT PLACEMENT Left 10/06/2021   Procedure: CYSTOSCOPY LEFT RETROGRADE PYELOGRAM URETEROSCOPY/HOLMIUM LASER/STENT PLACEMENT;  Surgeon: Irine Seal, MD;  [facility];  Service: Urology;  Laterality: Left;   ESOPHAGOGASTRODUODENOSCOPY N/A 08/03/2018   Procedure: ESOPHAGOGASTRODUODENOSCOPY (EGD);  Surgeon: Rogene Houston, MD;  [facility];  Service: Endoscopy;  Laterality: N/A;  2:55   EXTRACORPOREAL SHOCK WAVE LITHOTRIPSY Right 06/18/2019    Procedure: EXTRACORPOREAL SHOCK WAVE LITHOTRIPSY (ESWL);  Surgeon: Lucas Mallow, MD;  [facility];  Service: Urology;  Laterality: Right;   INTRAOPERATIVE TRANSESOPHAGEAL ECHOCARDIOGRAM N/A 10/19/2013   Procedure: INTRAOPERATIVE TRANSESOPHAGEAL ECHOCARDIOGRAM;  Surgeon: Rexene Alberts, MD;  Location: [place];  Service: Open Heart Surgery;  Laterality: N/A;   KYPHOPLASTY N/A 03/27/2021   Procedure: Lumbar One and Lumbar Two Kyphoplasty;  Surgeon: Erline Levine, MD;  Location: [place];  Service: Neurosurgery;  Laterality: N/A;   left hand carpal tunnel release  04/04/2015   LEFT HEART CATHETERIZATION WITH CORONARY ANGIOGRAM N/A 10/09/2013   Procedure: LEFT HEART CATHETERIZATION WITH CORONARY ANGIOGRAM;  Surgeon: Burnell Blanks, MD;  Location: [facility] C[facility];  Service: Cardiovascular;  Laterality: N/A;   Left shoulder rotator cuff repair  12/04/2011   MOHS procedure  01/06/2021   dr Harl Bowie on left hand   ORIF PERIPROSTHETIC FRACTURE Left 03/19/2020   Procedure: OPEN REDUCTION INTERNAL FIXATION (ORIF) PERIPROSTHETIC FRACTURE LEFT FEMUR WITH FEMORAL REVISION;  Surgeon: Gaynelle Arabian, MD;  [facility];  Service: Orthopedics;  Laterality: Left;   TOTAL HIP ARTHROPLASTY Left 01/28/2020   Procedure: TOTAL HIP ARTHROPLASTY ANTERIOR APPROACH;  Surgeon: Gaynelle Arabian, MD;  [facility];  Service: Orthopedics;  Laterality: Left;  170mn   TOTAL KNEE ARTHROPLASTY Left 04/18/2001   traumaticvamputation of finger  2015   VASECTOMY     yrs ago per wife on 10-01-2021    Current Outpatient Medications  Medication Sig Dispense Refill   allopurinol (ZYLOPRIM) 300 MG tablet Take 150 mg by mouth in the morning.     amLODipine (NORVASC) 5 MG tablet Take 1 tablet (5 mg total) by mouth daily. 30 tablet 1   Ascorbic Acid (VITAMIN C) 1000 MG tablet Take 1,000 mg by mouth in the morning.     aspirin EC 81 MG tablet Take 1 tablet (81 mg total) by mouth daily. Swallow whole. 90 tablet 3    Calcium Carb-Cholecalciferol (CALCIUM 600+D3 PO) Take 1 tablet by mouth in the morning and at bedtime.     HYDROcodone-acetaminophen (NORCO/VICODIN) 5-325 MG tablet Take 1 tablet by mouth every 8 (eight) hours as needed.     metoprolol tartrate (LOPRESSOR) 25 MG tablet Take 1/2 (one-half) tablet by mouth twice daily 90 tablet 3   Misc Natural Products (OSTEO BI-FLEX ADV TRIPLE ST) TABS Take 1 tablet by mouth in the morning.     Multiple Vitamin (MULTIVITAMIN) tablet Take 1 tablet by mouth daily.     nitroGLYCERIN (NITROSTAT) 0.4 MG SL tablet Place 1 tablet (0.4 mg total) under the tongue every 5 (five) minutes as needed. 25 tablet 3   Omega-3 Fatty Acids (FISH OIL) 500 MG CAPS Take 500 mg by mouth in the morning.     pantoprazole (PROTONIX) 40 MG tablet Take 1 tablet (40 mg total) by mouth daily. 30 tablet 1   pravastatin (PRAVACHOL) 40 MG tablet Take 40 mg by mouth every evening.      tamsulosin (FLOMAX) 0.4 MG CAPS capsule  Take 0.4 mg by mouth in the morning.     oxyCODONE-acetaminophen (PERCOCET/ROXICET) 5-325 MG tablet Take 1 tablet by mouth 4 (four) times daily as needed (PAIN.). (Patient not taking: Reported on 01/04/2022)     pyridostigmine (MESTINON) 60 MG tablet Take 0.5 tablets (30 mg total) by mouth 3 (three) times daily. (Patient not taking: Reported on 01/04/2022) 150 tablet 4   No current facility-administered medications for this visit.   Allergies:  Gabapentin   ROS: No palpitations or syncope  Physical Exam: VS:  BP 122/78   Pulse (!) 57   Ht '6\' 1"'$  (1.854 m)   Wt 182 lb 3.2 oz (82.6 kg)   SpO2 97%   BMI 24.04 kg/m , BMI Body mass index is 24.04 kg/m.  Wt Readings from Last 3 Encounters:  01/04/22 182 lb 3.2 oz (82.6 kg)  10/06/21 182 lb 11.2 oz (82.9 kg)  09/01/21 189 lb 12.8 oz (86.1 kg)    General: Patient appears comfortable at rest.  Using a cane. HEENT: Conjunctiva and lids normal. Neck: Supple, no elevated JVP or carotid bruits, no thyromegaly. Lungs: Clear  to auscultation, nonlabored breathing at rest. Cardiac: Regular rate and rhythm, no S3 or significant systolic murmur. Extremities: No pitting edema.  ECG:  An ECG dated 09/18/2020 was personally reviewed today and demonstrated:  Sinus rhythm with increased voltage and old inferior infarct pattern.  Recent Labwork: 08/10/2021: ALT 20; AST 22; Platelets 262 10/06/2021: BUN 7; Creatinine, Ser 1.10; Hemoglobin 13.3; Potassium 3.7; Sodium 136  May 2023: Hemoglobin 13.9, platelets 259, potassium 3.8, BUN 10, creatinine 0.93  Other Studies Reviewed Today:  Lexiscan Myoview 10/05/2021:   Findings are consistent with small prior inferior/inferoseptal myocardial infarction with very mild peri-infarct ischemia. The study is low risk.   No ST deviation was noted.   There is a small mild intensity inferior/inferoseptal defect with very mild reversibility.   Left ventricular function is normal. Nuclear stress EF: 57 %. The left ventricular ejection fraction is normal (55-65%). End diastolic cavity size is normal.  Assessment and Plan:  1.  Multivessel CAD status post CABG in 2015.  He reports no active angina on medical therapy.  Follow-up Myoview in February was low risk showing small inferior/inferoseptal infarct scar with very mild peri-infarct ischemia and normal LVEF at 57%.  I reviewed his ECG today.  Continue aspirin, Lopressor, Norvasc, Pravachol, and as needed nitroglycerin.  2.  Mixed hyperlipidemia on Pravachol.  He continues to follow with Dr. Hilma Favors.  Medication Adjustments/Labs and Tests Ordered: Current medicines are reviewed at length with the patient today.  Concerns regarding medicines are outlined above.   Tests Ordered: Orders Placed This Encounter  Procedures   EKG 12-Lead    Medication Changes: No orders of the defined types were placed in this encounter.   Disposition:  Follow up  6 months.  Signed, Satira Sark, MD, [facility] 01/04/2022 9:59 AM    [facility] [address] [place], [place] [address] Phone: [phone]; Fax: [phone]

## 2022-01-04 ENCOUNTER — Encounter: Payer: Self-pay | Admitting: Cardiology

## 2022-01-04 ENCOUNTER — Ambulatory Visit (INDEPENDENT_AMBULATORY_CARE_PROVIDER_SITE_OTHER): Payer: Medicare Other | Admitting: Cardiology

## 2022-01-04 VITALS — BP 122/78 | HR 57 | Ht 73.0 in | Wt 182.2 lb

## 2022-01-04 DIAGNOSIS — E782 Mixed hyperlipidemia: Secondary | ICD-10-CM | POA: Diagnosis not present

## 2022-01-04 DIAGNOSIS — I25119 Atherosclerotic heart disease of native coronary artery with unspecified angina pectoris: Secondary | ICD-10-CM | POA: Diagnosis not present

## 2022-01-04 NOTE — Patient Instructions (Signed)
Medication Instructions:  Your physician recommends that you continue on your current medications as directed. Please refer to the Current Medication list given to you today.   Labwork: None today  Testing/Procedures: None today  Follow-Up: 6 months  Any Other Special Instructions Will Be Listed Below (If Applicable).  If you need a refill on your cardiac medications before your next appointment, please call your pharmacy.  

## 2022-01-12 ENCOUNTER — Other Ambulatory Visit: Payer: Self-pay

## 2022-01-12 MED ORDER — METOPROLOL TARTRATE 25 MG PO TABS: ORAL_TABLET | ORAL | 3 refills | Status: DC

## 2022-01-26 DIAGNOSIS — G7000 Myasthenia gravis without (acute) exacerbation: Secondary | ICD-10-CM | POA: Diagnosis not present

## 2022-01-26 DIAGNOSIS — M1991 Primary osteoarthritis, unspecified site: Secondary | ICD-10-CM | POA: Diagnosis not present

## 2022-01-26 DIAGNOSIS — M5136 Other intervertebral disc degeneration, lumbar region: Secondary | ICD-10-CM | POA: Diagnosis not present

## 2022-01-26 DIAGNOSIS — Z6826 Body mass index (BMI) 26.0-26.9, adult: Secondary | ICD-10-CM | POA: Diagnosis not present

## 2022-01-28 DIAGNOSIS — Z9889 Other specified postprocedural states: Secondary | ICD-10-CM | POA: Diagnosis not present

## 2022-01-28 DIAGNOSIS — Z4789 Encounter for other orthopedic aftercare: Secondary | ICD-10-CM | POA: Diagnosis not present

## 2022-02-03 DIAGNOSIS — Z96642 Presence of left artificial hip joint: Secondary | ICD-10-CM | POA: Diagnosis not present

## 2022-02-09 ENCOUNTER — Ambulatory Visit: Payer: Medicare Other | Attending: Family Medicine | Admitting: Physical Therapy

## 2022-02-09 ENCOUNTER — Encounter: Payer: Self-pay | Admitting: Physical Therapy

## 2022-02-09 DIAGNOSIS — M6281 Muscle weakness (generalized): Secondary | ICD-10-CM | POA: Diagnosis not present

## 2022-02-09 DIAGNOSIS — M25552 Pain in left hip: Secondary | ICD-10-CM

## 2022-02-09 DIAGNOSIS — R2689 Other abnormalities of gait and mobility: Secondary | ICD-10-CM

## 2022-02-09 DIAGNOSIS — R2681 Unsteadiness on feet: Secondary | ICD-10-CM | POA: Diagnosis not present

## 2022-02-10 ENCOUNTER — Ambulatory Visit: Payer: Medicare Other | Admitting: Physical Therapy

## 2022-02-10 DIAGNOSIS — R2681 Unsteadiness on feet: Secondary | ICD-10-CM

## 2022-02-10 DIAGNOSIS — M6281 Muscle weakness (generalized): Secondary | ICD-10-CM | POA: Diagnosis not present

## 2022-02-10 DIAGNOSIS — M25552 Pain in left hip: Secondary | ICD-10-CM | POA: Diagnosis not present

## 2022-02-10 DIAGNOSIS — R2689 Other abnormalities of gait and mobility: Secondary | ICD-10-CM

## 2022-02-11 ENCOUNTER — Encounter: Payer: Self-pay | Admitting: Physical Therapy

## 2022-02-11 NOTE — Therapy (Signed)
OUTPATIENT PHYSICAL THERAPY NEURO EVALUATION   Patient Name: Brandon [name] MRN: [id number] DOB:26-Jun-1946, 76 y.o., male Today's Date: 02/11/2022   PCP: Sharilyn Sites, MD REFERRING PROVIDER: Florinda Marker    PT End of Session - 02/11/22 1521     Visit Number 2    Number of Visits 13    Date for PT Re-Evaluation 04/02/22   pushed out due to scheduling   Authorization Type Medicare    Authorization Time Period 02-08-22 - 05-09-22    PT Start Time 1315    PT Stop Time 1400    PT Time Calculation (min) 45 min    Equipment Utilized During Treatment Other (comment)   pool noodle, small bar bells, aquatic cuffs   Activity Tolerance Patient tolerated treatment well    Behavior During Therapy [facility] for tasks assessed/performed             Past Medical History:  Diagnosis Date   Arthritis    Coronary atherosclerosis of native coronary artery    Multivessel status post CABG March 2015   DDD (degenerative disc disease), lumbar    Essential hypertension    GERD (gastroesophageal reflux disease)    Hard of hearing both ears    Left ear is worst   Hemorrhoids    History of gout    History of kidney stones    history of myasthenia Gravis 2021   History of [facility] spotted fever    Left ureteral  & renal stone    Lumbago    Mixed hyperlipidemia    Personal history of COVID-19 08/10/2021   Paxlovid   Plantar fascial fibromatosis    Postoperative atrial fibrillation (HCC)    Skin Cancer ([place])    Use of cane as ambulatory aid    Uses roller walker    UTI (urinary tract infection) 09/16/2021   Wears glasses    Past Surgical History:  Procedure Laterality Date   AMPUTATION Right 03/27/2014   Procedure: DEBRIDEMENT AND CLOSURE RIGHT INDEX FINGER;  Surgeon: [name], MD;  Location: [place];  Service: Orthopedics;  Laterality: Right;   BACK SURGERY     lower not sure when per wife on 10-01-2021   BICEPT TENODESIS  11/26/2011   Procedure: BICEPT TENODESIS;   Surgeon: Augustin Schooling, MD;  Location: [place];  Service: Orthopedics;  Laterality: Left;   BIOPSY  08/03/2018   Procedure: BIOPSY;  Surgeon: Rogene Houston, MD;  [facility];  Service: Endoscopy;;  antral   Cataract surgery Bilateral    yrs ago per wife on 10-01-2021   CHOLECYSTECTOMY N/A 09/22/2018   Procedure: LAPAROSCOPIC CHOLECYSTECTOMY;  Surgeon: Virl Cagey, MD;  [facility]: [facility];  Service: General;  Laterality: N/A;   COLONOSCOPY N/A 12/26/2014   Procedure: COLONOSCOPY;  Surgeon: Rogene Houston, MD;  [facility];  Service: Endoscopy;  Laterality: N/A;  730   CORONARY ARTERY BYPASS GRAFT N/A 10/19/2013   Procedure: CORONARY ARTERY BYPASS GRAFTING (CABG);  Surgeon: Rexene Alberts, MD;  Location: [place];  Service: Open Heart Surgery;  Laterality: N/A;  CABG times four using left internal mammary artery and left leg saphenous vein, incision made on right leg but no vein removed   CYSTOSCOPY WITH RETROGRADE PYELOGRAM, URETEROSCOPY AND STENT PLACEMENT Left 08/24/2016   Procedure: CYSTOSCOPY WITH LEFT RETROGRADE PYELOGRAM, LEFT URETEROSCOPY, BASKET EXTRACTION LEFT URETERAL STONE;  Surgeon: Irine Seal, MD;  Location: [facility];  Service: Urology;  Laterality: Left;  CYSTOSCOPY/URETEROSCOPY/HOLMIUM LASER/STENT PLACEMENT Left 10/06/2021   Procedure: CYSTOSCOPY LEFT RETROGRADE PYELOGRAM URETEROSCOPY/HOLMIUM LASER/STENT PLACEMENT;  Surgeon: Irine Seal, MD;  Location[facility];  Service: Urology;  Laterality: Left;   ESOPHAGOGASTRODUODENOSCOPY N/A 08/03/2018   Procedure: ESOPHAGOGASTRODUODENOSCOPY (EGD);  Surgeon: Rogene Houston, MD;  [facility];  Service: Endoscopy;  Laterality: N/A;  2:55   EXTRACORPOREAL SHOCK WAVE LITHOTRIPSY Right 06/18/2019   Procedure: EXTRACORPOREAL SHOCK WAVE LITHOTRIPSY (ESWL);  Surgeon: Lucas Mallow, MD;  [facility];  Service: Urology;  Laterality: Right;   INTRAOPERATIVE TRANSESOPHAGEAL  ECHOCARDIOGRAM N/A 10/19/2013   Procedure: INTRAOPERATIVE TRANSESOPHAGEAL ECHOCARDIOGRAM;  Surgeon: Rexene Alberts, MD;  Location: [place];  Service: Open Heart Surgery;  Laterality: N/A;   KYPHOPLASTY N/A 03/27/2021   Procedure: Lumbar One and Lumbar Two Kyphoplasty;  Surgeon: Erline Levine, MD;  Location: [place];  Service: Neurosurgery;  Laterality: N/A;   left hand carpal tunnel release  04/04/2015   LEFT HEART CATHETERIZATION WITH CORONARY ANGIOGRAM N/A 10/09/2013   Procedure: LEFT HEART CATHETERIZATION WITH CORONARY ANGIOGRAM;  Surgeon: Burnell Blanks, MD;  Location: [facility];  Service: Cardiovascular;  Laterality: N/A;   Left shoulder rotator cuff repair  12/04/2011   MOHS procedure  01/06/2021   dr Harl Bowie on left hand   ORIF PERIPROSTHETIC FRACTURE Left 03/19/2020   Procedure: OPEN REDUCTION INTERNAL FIXATION (ORIF) PERIPROSTHETIC FRACTURE LEFT FEMUR WITH FEMORAL REVISION;  Surgeon: Gaynelle Arabian, MD;  [facility];  Service: Orthopedics;  Laterality: Left;   TOTAL HIP ARTHROPLASTY Left 01/28/2020   Procedure: TOTAL HIP ARTHROPLASTY ANTERIOR APPROACH;  Surgeon: Gaynelle Arabian, MD;  [facility];  Service: Orthopedics;  Laterality: Left;  13mn   TOTAL KNEE ARTHROPLASTY Left 04/18/2001   traumaticvamputation of finger  2015   VASECTOMY     yrs ago per wife on 10-01-2021   Patient Active Problem List   Diagnosis Date Noted   Fracture, lumbar vertebra, compression ([place] 03/27/2021   Atrial fibrillation with RVR ([place]    Periprosthetic fracture around internal prosthetic left hip joint ([facility] 03/19/2020   OA (osteoarthritis) of hip 01/28/2020   Closed displaced fracture of left femoral neck ([place] 01/28/2020   History of colonic polyps 11/12/2019   GERD (gastroesophageal reflux disease) 11/06/2019   Myasthenia gravis with (acute) exacerbation ([facility] 11/06/2019   Acute diarrhea 09/17/2019   Heme positive stool 09/17/2019   Unspecified osteoarthritis,  unspecified site    Hyperlipidemia, unspecified    Personal history of other diseases of the musculoskeletal system and connective tissue    Atherosclerotic heart disease of native coronary artery without angina pectoris    Hypertensive heart disease without heart failure    Melena    Plantar fascial fibromatosis    Pleurodynia    Calculus of gallbladder without cholecystitis without obstruction 09/14/2018   Nausea without vomiting 06/05/2018   Postoperative atrial fibrillation ([facility] 11/09/2013   S/P CABG x 4 10/19/2013   Coronary atherosclerosis of native coronary artery 10/18/2013   Essential hypertension, benign 09/18/2013   Family history of hypertrophic cardiomyopathy 09/18/2013   Mixed hyperlipidemia     ONSET DATE: 12-29-21 (date of L2-3 MED surgery)  REFERRING DIAG: ZQ22.297(ICD-10-CM) - Presence of left artificial hip joint ; Lt trochanteric bursitis  THERAPY DIAG:  Other abnormalities of gait and mobility  Unsteadiness on feet  Muscle weakness (generalized)  Rationale for Evaluation and Treatment Rehabilitation  SUBJECTIVE:  SUBJECTIVE STATEMENT: Pt presents for aquatic therapy at [place] - says he walked this morning and it helped to loosen up his back; has used frozen water bottle on his left hip some - reports minimal pain at current time Pt accompanied by:  wife, [name]  PERTINENT HISTORY: Mr. Bogie is a 76 year old male with PMH of Myasthenia Gravis, osteoporosis, CAD who presented to [facility] on 12/11/21 for initial neurosurgical evaluation of low back pain referred by Dr. Sharilyn Sites. He has a history of an L3-S1 fusion with Dr. Glenna Fellows in 2016 (retired) as well as L1/L2 kyphoplasty on 03/27/21 with Dr. Erline Levine.  He reported good relief with lumbar fusion  until the last 3 years.  He was diagnosed with myasthenia gravis in 2021 and was placed on steroids.  He feels that this contributed to to his osteoporosis.  He reported low back pain that radiated to his left buttock and groin.  Pain was sharp rated as 10 out of 10 at its worst.  Pain was worse with lying, standing or walking.  He was taking hydrocodone prescribed by his PCP which does not really help.  He reported lower extremity weakness and lower extremity aching.  He had tried ESI with minimal relief.  He has a history of a left hip replacement. He is s/p left L2-3 MED on 12/29/21 with Dr. Linus Mako. [place]. Pt also has Myasthenia Gravis - diagnosed in Jan. 2021   PAIN:  Are you having pain? Yes low back and Lt hip Low back 2/10 with weight bearing; no pain in sitting;  hip pain 1-2/10 with weight bearing; no pain in sitting  Sitting helps the back pain; walking helps the hip pain - trying 10'-15' 2-3 times a day Aggravating factors - weight bearing for back; pressure aggravates the hip  PRECAUTIONS: None  WEIGHT BEARING RESTRICTIONS No  FALLS: Has patient fallen in last 6 months? No  LIVING ENVIRONMENT: Lives with: lives with their family Lives in: House/apartment Stairs: Yes: External: 3 steps; on right going up Has following equipment at home: Single point cane and Walker - 2 wheeled  PLOF: Independent  PATIENT GOALS decrease Lt hip and low back pain  OBJECTIVE:    Aquatic therapy at [place]. 90 degrees  Patient seen for aquatic therapy today.  Treatment took place in water 3.5-4.8 feet deep depending upon activity.  Pt entered and exited the pool via step negotiation with use of hand rails modified independently.  Pt performed water walking forwards 18' x 4 reps, backwards 18' x 2 reps and sideways 18' x 4 reps each with UE support barbells (no bar bells used for forward amb. 18' x 2 reps)  Marching in place 10 reps without UE support   Pt performed crossovers  in front 18' x 2 reps - pt held bar bells for assist with balance; stepping behind 18' x 2 reps  Pt performed squats x 10 reps with bil. UE support on pool edge; LLE unilateral squats 10 reps  Standing hip abduction with use of aquatic cuff 15 reps bil. LE's; pt performed hip flexion, extension and marching 15 reps each leg with UE support with use of aquatic cuff  Pt performed closed chain strengthening exercise for hip extensor strengthening - pushing yellow noodle down toward floor 2 sets10 reps each leg; then knee flexion/extension 10 reps each leg with foot on yellow noodle for resistance  Supine exercises with use of large yellow noodle for  flotation - bicycling LE's 20 reps; hip abdct./adduction 30 reps for strengthening  Pt requires buoyancy of water for support with standing balance and with gait training without UE support; buoyancy of water also needed for joint unloading for pain reduction with weight bearing; buoyancy also provides spinal decompression for reduced back pain in standing;  viscosity of water needed for resistance for strengthening bil. LE's:  water current provides perturbations for challenges with balance that cannot be safely performed on land              PATIENT EDUCATION: Education details: eval results; instructed pt to ice Lt hip with frozen water bottle for massage simultaneously; use pillow under knees when lying supine to decrease strain on low back Person educated: Patient and Spouse Education method: Customer service manager Education comprehension: verbalized understanding   HOME EXERCISE PROGRAM: [employer] [email or web address]  Access Code: [email or web address] URL: [email or web address] Date: 02/09/2022 Prepared by: Ethelene Browns  Exercises - Clamshell  - 1 x daily - 7 x weekly - 1-2 sets - 10 reps - 2-3 hold - Sidelying Hip Abduction  - 1 x daily - 7 x weekly - 1-2 sets - 10 reps - 2 hold  GOALS: Goals reviewed with patient?  Yes  SHORT TERM GOALS: Target date: 03-12-22   PT Short Term Goals - 02/09/22 1945       PT SHORT TERM GOAL #1   Title Initiate aquatic therapy at [facility] pt will amb. from car to pool area with RW with supervision with c/o Lt hip pain </= 5/10 after amb. this distance.    Time 3    Period Weeks    Status New    Target Date 03/12/22      PT SHORT TERM GOAL #2   Title Pt will report decreased Lt hip pain to </= 4/10 with weight bearing.    Baseline pt reports Lt hip pain 7-8/10 with weight bearing    Time 3    Period Weeks    Status New    Target Date 03/12/22      PT SHORT TERM GOAL #3   Title Pt will amb. 62' without device with SBA for increased household amb.    Baseline pt using SPC    Time 3    Period Weeks    Status New    Target Date 03/12/22      PT SHORT TERM GOAL #4   Title Independent in HEP for Lt hip musc. strengthening and ITB stretching.    Time 3    Period Weeks    Status New    Target Date 03/12/22             LONG TERM GOALS: Target date: 04-02-22    PT Long Term Goals - 02/09/22 1952       PT LONG TERM GOAL #1   Title Independent in aquatic HEP to be continued at facility of pt's choice upon discharge from PT.    Time 6    Period Weeks    Status New    Target Date 04/02/22      PT LONG TERM GOAL #2   Title Pt will report reduced back/hip pain to </= 2/10 with weight bearing.    Baseline 4-5/10 reported with weight bearing    Time 6    Period Weeks    Status New    Target Date 04/02/22      PT LONG TERM GOAL #3   Title Pt will  perform sit to stand transfer with bil. UE support from chair with c/o pain </2/10 in low back and Lt hip.    Baseline 4-5/10 on 02-09-22    Time 6    Period Weeks    Status New    Target Date 04/02/22      PT LONG TERM GOAL #4   Title Improve TUG score by at least 3 secs without assistive device to demo improved functional mobility.    Baseline TBA    Time 6    Period Weeks    Status  New    Target Date 04/02/22      PT LONG TERM GOAL #5   Title Pt will tolerate lying supine for at least 1 hour for increased comfort with sleeping.    Baseline < 30" - 02-09-22    Time 6    Period Weeks    Status New    Target Date 04/02/22            ASSESSMENT:  CLINICAL IMPRESSION: Aquatic PT session focused on stretching Lt hip flexors, ITB stretch and strengthening Lt hip abductors and extensors, and water walking for back pain management.  Pt tolerated exercises well with minimal c/o in Lt hip and in low back during session.  Pt able to perform standing balance exercises with UE support on bar bells; demonstrated ability to independently recover LOB when it did occur.  Pt was able to amb. From pool to whirlpool without use of SPC with supervision only.  Cont with POC.    OBJECTIVE IMPAIRMENTS decreased balance, difficulty walking, decreased strength, postural dysfunction, and pain.   ACTIVITY LIMITATIONS carrying, bending, standing, squatting, sleeping, stairs, and locomotion level  PARTICIPATION LIMITATIONS: meal prep, cleaning, laundry, shopping, community activity, and yard work  PERSONAL FACTORS Past/current experiences, Time since onset of injury/illness/exacerbation, and 1-2 comorbidities: low back pain  are also affecting patient's functional outcome.   REHAB POTENTIAL: Good  CLINICAL DECISION MAKING: Evolving/moderate complexity  EVALUATION COMPLEXITY: Moderate  PLAN: PT FREQUENCY: 2x/week  PT DURATION: 6 weeks - plan for 6 land visits, 6 aquatic visits  PLANNED INTERVENTIONS: Therapeutic exercises, Therapeutic activity, Neuromuscular re-education, Balance training, Gait training, Patient/Family education, Stair training, Aquatic Therapy, Cryotherapy, and Manual therapy  PLAN FOR NEXT SESSION: do TUG; check HEP   Teruo Stilley, [place], PT 02/11/2022, 3:25 PM

## 2022-02-22 ENCOUNTER — Ambulatory Visit (HOSPITAL_COMMUNITY): Payer: Medicare Other

## 2022-02-22 ENCOUNTER — Ambulatory Visit (HOSPITAL_COMMUNITY): Payer: Medicare Other | Admitting: Anesthesiology

## 2022-02-22 ENCOUNTER — Encounter (HOSPITAL_COMMUNITY)
Admission: RE | Disposition: A | Discharge status: Home / Self Care | Payer: Self-pay | Source: Ambulatory Visit | Attending: Urology

## 2022-02-22 ENCOUNTER — Other Ambulatory Visit: Payer: Self-pay | Admitting: Urology

## 2022-02-22 ENCOUNTER — Ambulatory Visit: Payer: Medicare Other | Admitting: Physical Therapy

## 2022-02-22 ENCOUNTER — Encounter (HOSPITAL_COMMUNITY): Payer: Self-pay | Admitting: Urology

## 2022-02-22 ENCOUNTER — Ambulatory Visit (HOSPITAL_COMMUNITY)
Admission: RE | Admit: 2022-02-22 | Discharge: 2022-02-22 | Disposition: A | Discharge status: Home / Self Care | Payer: Medicare Other | Source: Ambulatory Visit | Attending: Urology | Admitting: Urology

## 2022-02-22 ENCOUNTER — Ambulatory Visit (HOSPITAL_BASED_OUTPATIENT_CLINIC_OR_DEPARTMENT_OTHER): Payer: Medicare Other | Admitting: Anesthesiology

## 2022-02-22 DIAGNOSIS — I2510 Atherosclerotic heart disease of native coronary artery without angina pectoris: Secondary | ICD-10-CM | POA: Insufficient documentation

## 2022-02-22 DIAGNOSIS — N201 Calculus of ureter: Secondary | ICD-10-CM | POA: Insufficient documentation

## 2022-02-22 DIAGNOSIS — I10 Essential (primary) hypertension: Secondary | ICD-10-CM

## 2022-02-22 DIAGNOSIS — Z87891 Personal history of nicotine dependence: Secondary | ICD-10-CM | POA: Insufficient documentation

## 2022-02-22 DIAGNOSIS — Z87442 Personal history of urinary calculi: Secondary | ICD-10-CM | POA: Insufficient documentation

## 2022-02-22 DIAGNOSIS — R1084 Generalized abdominal pain: Secondary | ICD-10-CM | POA: Diagnosis not present

## 2022-02-22 DIAGNOSIS — N202 Calculus of kidney with calculus of ureter: Secondary | ICD-10-CM | POA: Diagnosis not present

## 2022-02-22 DIAGNOSIS — N200 Calculus of kidney: Secondary | ICD-10-CM | POA: Diagnosis not present

## 2022-02-22 DIAGNOSIS — N130 Hydronephrosis with ureteropelvic junction obstruction: Secondary | ICD-10-CM | POA: Diagnosis not present

## 2022-02-22 DIAGNOSIS — N3000 Acute cystitis without hematuria: Secondary | ICD-10-CM | POA: Diagnosis not present

## 2022-02-22 DIAGNOSIS — N390 Urinary tract infection, site not specified: Secondary | ICD-10-CM

## 2022-02-22 DIAGNOSIS — N400 Enlarged prostate without lower urinary tract symptoms: Secondary | ICD-10-CM | POA: Diagnosis not present

## 2022-02-22 DIAGNOSIS — K219 Gastro-esophageal reflux disease without esophagitis: Secondary | ICD-10-CM | POA: Insufficient documentation

## 2022-02-22 DIAGNOSIS — R310 Gross hematuria: Secondary | ICD-10-CM | POA: Diagnosis not present

## 2022-02-22 DIAGNOSIS — M1990 Unspecified osteoarthritis, unspecified site: Secondary | ICD-10-CM | POA: Diagnosis not present

## 2022-02-22 DIAGNOSIS — R319 Hematuria, unspecified: Secondary | ICD-10-CM | POA: Diagnosis not present

## 2022-02-22 HISTORY — PX: CYSTOSCOPY W/ URETERAL STENT PLACEMENT: SHX1429

## 2022-02-22 LAB — BASIC METABOLIC PANEL
Anion gap: 8 (ref 5–15)
BUN: 13 mg/dL (ref 8–23)
CO2: 24 mmol/L (ref 22–32)
Calcium: 9.7 mg/dL (ref 8.9–10.3)
Chloride: 106 mmol/L (ref 98–111)
Creatinine, Ser: 0.96 mg/dL (ref 0.61–1.24)
GFR, Estimated: >60 mL/min (ref >60)
Glucose, Bld: 97 mg/dL (ref 70–99)
Potassium: 3.8 mmol/L (ref 3.5–5.1)
Sodium: 138 mmol/L (ref 135–145)

## 2022-02-22 LAB — CBC
HCT: 46.3 % (ref 39.0–52.0)
Hemoglobin: 14.9 g/dL (ref 13.0–17.0)
MCH: 28.9 pg (ref 26.0–34.0)
MCHC: 32.2 g/dL (ref 30.0–36.0)
MCV: 89.9 fL (ref 80.0–100.0)
Platelets: 265 10*3/uL (ref 150–400)
RBC: 5.15 MIL/uL (ref 4.22–5.81)
RDW: 14.4 % (ref 11.5–15.5)
WBC: 9.2 10*3/uL (ref 4.0–10.5)
nRBC: 0 % (ref 0.0–0.2)

## 2022-02-22 SURGERY — CYSTOSCOPY, WITH RETROGRADE PYELOGRAM AND URETERAL STENT INSERTION
Anesthesia: General | Site: Ureter | Laterality: Right

## 2022-02-22 MED ORDER — HYDROCODONE-ACETAMINOPHEN 5-325 MG PO TABS: 1.0000 | ORAL_TABLET | ORAL | 0 refills | Status: DC | PRN

## 2022-02-22 MED ORDER — SODIUM CHLORIDE 0.9 % IR SOLN
Status: DC | PRN
  Administered 2022-02-22: 3000 mL

## 2022-02-22 MED ORDER — CEFAZOLIN SODIUM-DEXTROSE 2-4 GM/100ML-% IV SOLN
2.0000 g | Freq: Once | INTRAVENOUS | Status: AC
  Administered 2022-02-22: 2 g via INTRAVENOUS
  Filled 2022-02-22: qty 100

## 2022-02-22 MED ORDER — CEPHALEXIN 500 MG PO CAPS: 500.0000 mg | ORAL_CAPSULE | Freq: Two times a day (BID) | ORAL | 0 refills | Status: AC

## 2022-02-22 MED ORDER — CHLORHEXIDINE GLUCONATE 0.12 % MT SOLN
15.0000 mL | Freq: Once | OROMUCOSAL | Status: AC
Start: 2022-02-22 — End: 2022-02-22
  Administered 2022-02-22: 15 mL via OROMUCOSAL

## 2022-02-22 MED ORDER — FENTANYL CITRATE (PF) 100 MCG/2ML IJ SOLN
INTRAMUSCULAR | Status: DC | PRN
  Administered 2022-02-22 (×2): 50 ug via INTRAVENOUS

## 2022-02-22 MED ORDER — PROPOFOL 10 MG/ML IV BOLUS
INTRAVENOUS | Status: AC
  Filled 2022-02-22: qty 20

## 2022-02-22 MED ORDER — OXYBUTYNIN CHLORIDE 5 MG PO TABS: 5.0000 mg | ORAL_TABLET | Freq: Three times a day (TID) | ORAL | 1 refills | Status: DC | PRN

## 2022-02-22 MED ORDER — EPHEDRINE 5 MG/ML INJ
INTRAVENOUS | Status: AC
  Filled 2022-02-22: qty 5

## 2022-02-22 MED ORDER — LIDOCAINE 2% (20 MG/ML) 5 ML SYRINGE
INTRAMUSCULAR | Status: DC | PRN
  Administered 2022-02-22: 100 mg via INTRAVENOUS

## 2022-02-22 MED ORDER — LIDOCAINE HCL (PF) 2 % IJ SOLN
INTRAMUSCULAR | Status: AC
  Filled 2022-02-22: qty 5

## 2022-02-22 MED ORDER — DEXAMETHASONE SODIUM PHOSPHATE 10 MG/ML IJ SOLN
INTRAMUSCULAR | Status: DC | PRN
  Administered 2022-02-22: 10 mg via INTRAVENOUS

## 2022-02-22 MED ORDER — ONDANSETRON HCL 4 MG/2ML IJ SOLN
INTRAMUSCULAR | Status: AC
  Filled 2022-02-22: qty 2

## 2022-02-22 MED ORDER — IOHEXOL 300 MG/ML  SOLN
INTRAMUSCULAR | Status: DC | PRN
  Administered 2022-02-22: 18 mL

## 2022-02-22 MED ORDER — FENTANYL CITRATE PF 50 MCG/ML IJ SOSY: 25.0000 ug | PREFILLED_SYRINGE | INTRAMUSCULAR | Status: DC | PRN

## 2022-02-22 MED ORDER — EPHEDRINE SULFATE-NACL 50-0.9 MG/10ML-% IV SOSY
PREFILLED_SYRINGE | INTRAVENOUS | Status: DC | PRN
  Administered 2022-02-22: 10 mg via INTRAVENOUS

## 2022-02-22 MED ORDER — PROPOFOL 10 MG/ML IV BOLUS
INTRAVENOUS | Status: DC | PRN
  Administered 2022-02-22: 150 mg via INTRAVENOUS

## 2022-02-22 MED ORDER — LACTATED RINGERS IV SOLN: INTRAVENOUS | Status: DC

## 2022-02-22 MED ORDER — DEXAMETHASONE SODIUM PHOSPHATE 10 MG/ML IJ SOLN
INTRAMUSCULAR | Status: AC
  Filled 2022-02-22: qty 1

## 2022-02-22 MED ORDER — FENTANYL CITRATE (PF) 100 MCG/2ML IJ SOLN
INTRAMUSCULAR | Status: AC
  Filled 2022-02-22: qty 2

## 2022-02-22 MED ORDER — ONDANSETRON HCL 4 MG/2ML IJ SOLN
INTRAMUSCULAR | Status: DC | PRN
  Administered 2022-02-22: 4 mg via INTRAVENOUS

## 2022-02-22 MED ORDER — 0.9 % SODIUM CHLORIDE (POUR BTL) OPTIME
TOPICAL | Status: DC | PRN
  Administered 2022-02-22: 1000 mL

## 2022-02-22 MED ORDER — PHENAZOPYRIDINE HCL 200 MG PO TABS: 200.0000 mg | ORAL_TABLET | Freq: Three times a day (TID) | ORAL | 0 refills | Status: DC | PRN

## 2022-02-22 SURGICAL SUPPLY — 13 items
BAG URO CATCHER STRL LF (MISCELLANEOUS) ×2 IMPLANT
CATH URETL OPEN 5X70 (CATHETERS) ×2 IMPLANT
CLOTH BEACON ORANGE TIMEOUT ST (SAFETY) ×2 IMPLANT
GLOVE SURG LX 7.5 STRW (GLOVE) ×1
GLOVE SURG LX STRL 7.5 STRW (GLOVE) ×1 IMPLANT
GOWN STRL REUS W/ TWL XL LVL3 (GOWN DISPOSABLE) ×1 IMPLANT
GOWN STRL REUS W/TWL XL LVL3 (GOWN DISPOSABLE) ×2
GUIDEWIRE ZIPWRE .038 STRAIGHT (WIRE) ×2 IMPLANT
MANIFOLD NEPTUNE II (INSTRUMENTS) ×2 IMPLANT
PACK CYSTO (CUSTOM PROCEDURE TRAY) ×2 IMPLANT
STENT URET 6FRX22 CONTOUR (STENTS) ×1 IMPLANT
STENT URET 6FRX24 CONTOUR (STENTS) ×1 IMPLANT
TUBING CONNECTING 10 (TUBING) ×2 IMPLANT

## 2022-02-22 NOTE — Anesthesia Procedure Notes (Signed)
Procedure Name: LMA Insertion Date/Time: 02/22/2022 5:20 PM  Performed by: Lind Covert, CRNAPre-anesthesia Checklist: Patient identified, Emergency Drugs available, Suction available, Patient being monitored and Timeout performed Patient Re-evaluated:Patient Re-evaluated prior to induction Oxygen Delivery Method: Circle system utilized Preoxygenation: Pre-oxygenation with 100% oxygen Induction Type: IV induction LMA: LMA inserted LMA Size: 5.0 Tube type: Oral Number of attempts: 1 Placement Confirmation: positive ETCO2 and breath sounds checked- equal and bilateral Tube secured with: Tape Dental Injury: Teeth and Oropharynx as per pre-operative assessment

## 2022-02-22 NOTE — H&P (View-Only) (Signed)
Office Visit Report     02/22/2022   --------------------------------------------------------------------------------   Jenne Pane. Mizuno  MRN: [id number]  DOB: Feb 01, 1946, 76 year old Male  PRIMARY CARE:  Sharilyn Sites, MD  REFERRING:  Irine Seal, MD  PROVIDER:  Irine Seal, M.D.  TREATING:  Ellison Hughs, M.D.  LOCATION:  [facility] [phone]     --------------------------------------------------------------------------------   CC: I have pain in the flank.  HPI: Jozsef Wescoat is a 76 year-old male established patient who is here for flank pain.  The problem is on both sides. His pain started about 02/21/2022. The pain is sharp. The intensity of his pain is rated as a 5. The pain is intermittent. The pain does radiate.   Resting< makes the pain better. Walking makes the pain worse. He was treated with the following pain medication(s): Hydrocodone.   He has had this same pain previously. He has had kidney stones.   -The patient presents today with a 24 hour history of low back pain with radiation into the left inguinal region and is associated with gross hematuria  -He denies N/V/F/C or dysuria  -AFVSS  -UA today shows sign of cystitis     ALLERGIES: No Allergies    MEDICATIONS: Metoprolol Tartrate 25 mg tablet  Allopurinol 300 mg tablet Oral  Amlodipine Besilate  Aspirin Ec 81 mg tablet, delayed release  Calcium  Fish Oil CAPS Oral  Gabapentin  Mestinon  Multi-Vitamin TABS Oral  Nitroglycerin 0.4 mg tablet, sublingual  Osteo Bi-Flex Regular Strength TABS Oral  Oxycodone Hcl  Pantoprazole Sodium  Pravachol 40 MG Oral Tablet Oral  Vitamin C 500 mg tablet Oral     GU PSH: Cysto Bladder Stone <2.5cm - 10/06/2021 Cysto Remove Stent FB Sim - 10/13/2021 Cystoscopy Ureteroscopy - 2015 ESWL, Right - 2020, 2010 Ureteroscopic laser litho - 10/06/2021 Ureteroscopic stone removal, Left - 2018       PSH Notes: Cystoscopy With Ureteroscopy, Hand Surgery,  CABG (CABG), Knee Replacement, Lithotripsy   NON-GU PSH: Back Surgery (Unspecified) CABG (coronary artery bypass grafting) - 2015 Carpal Tunnel Surgery.. Hip Replacement, Left - 07/29/2020 Revise Knee Joint - 2015     GU PMH: BPH w/LUTS, He is doing well after the stone removal. I will have him try to cut back the tamsulosin. - 12/07/2021, (Improving), - 2018 Nocturia - 12/07/2021, - 2019 Renal calculus, He has bilateral non-obstructing renal stones. I will have him return in 6 months with a KUB. - 12/07/2021, - 07/29/2020 (Stable), He has stable small stones. I will have him return in a year with a KUB., - 2019, Bilateral kidney stones, - 2015 Renal cyst, Bilateral simple cysts. - 12/07/2021, - 07/29/2020, Renal cyst, acquired, - 2014 Renal and ureteral calculus - 10/13/2021, - 09/24/2021, - 09/16/2021, He has left distal ureteral stones with obstruction and that is the likely cause of the hematuria and possibly the left hip pain. He is on tamsulosin and has hydrocodone. I will have him return in 2 weeks for a renal US. If he has not passed the stone, I will consider URS. , - 09/02/2021 (Improving), - 2018 Gross hematuria - 09/24/2021, - 09/02/2021, - 2020 Acute Cystitis/UTI, UA continues to appear infected despite being on appropriate antimicrobial treatment with cephalexin. Repeat urine culture sent today. - 09/16/2021, His UA looks infected but he has no fever or significant symptoms of concern. I will culture the urine and treat as needed but he was warned to contact us should he get a fever  or more severe pain. , - 09/02/2021 Ureteral obstruction secondary to calculous - 09/02/2021 Urinary Frequency - 07/29/2020 Urinary Urgency - 07/29/2020 Ureteral calculus, Right - 2020, Left, - 2017, Calculus of ureter, - 2015 Low back pain, Left-sided low back pain without sciatica - 2015 Bladder-neck stenosis/contracture, Bladder neck contracture - 2014 History of urolithiasis, Nephrolithiasis - 2014      PMH  Notes:  2009-01-16 10:23:44 - Note: Arthritis   NON-GU PMH: Bacteriuria, Urine culture today. - 12/07/2021 Encounter for general adult medical examination without abnormal findings, Encounter for preventive health examination - 2015 Personal history of other diseases of the musculoskeletal system and connective tissue, History of gout - 2015 Personal history of other diseases of the circulatory system, History of hypertension - 2014 Personal history of other diseases of the digestive system, History of esophageal reflux - 2014 Personal history of other endocrine, nutritional and metabolic disease, History of hypercholesterolemia - 2014    FAMILY HISTORY: Cardiac Failure - Mother Death In The Family Father - Father Death In The Family Mother - Mother Family Health Status Number - Runs In Family Heart Disease - Runs In Family Hypertension - Runs In Family Lung Cancer - Father nephrolithiasis - Runs In Family   SOCIAL HISTORY: Marital Status: Married Preferred Language: English; Ethnicity: Not Hispanic Or Latino; Race: White Current Smoking Status: Patient does not smoke anymore.  Social Drinker.  Drinks 2 caffeinated drinks per day. 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     Notes: Occupation:, Caffeine Use, Marital History - Currently Married, Previous History Of Smoking, Alcohol Use   REVIEW OF SYSTEMS:    GU Review Male:   Patient reports burning/ pain with urination. Patient denies frequent urination, hard to postpone urination, get up at night to urinate, leakage of urine, stream starts and stops, trouble starting your stream, have to strain to urinate , erection problems, and penile pain.  Gastrointestinal (Upper):   Patient denies nausea, vomiting, and indigestion/ heartburn.  Gastrointestinal (Lower):   Patient denies diarrhea and constipation.  Constitutional:   Patient denies fatigue, weight loss, fever, and night sweats.  Skin:   Patient denies skin rash/  lesion and itching.  Eyes:   Patient denies blurred vision and double vision.  Ears/ Nose/ Throat:   Patient denies sore throat and sinus problems.  Hematologic/Lymphatic:   Patient denies swollen glands and easy bruising.  Cardiovascular:   Patient denies leg swelling and chest pains.  Respiratory:   Patient denies cough and shortness of breath.  Endocrine:   Patient denies excessive thirst.  Musculoskeletal:   Patient reports back pain. Patient denies joint pain.  Neurological:   Patient denies headaches and dizziness.  Psychologic:   Patient denies depression and anxiety.   Notes: Back pain radiating to groin, Gross hematuria    VITAL SIGNS:      02/22/2022 09:36 AM  Weight 180 lb / 81.65 kg  BP 136/82 mmHg  Pulse 68 /min  Temperature 97.5 F / 36.3 C   MULTI-SYSTEM PHYSICAL EXAMINATION:    Constitutional: Well-nourished. No physical deformities. Normally developed. Good grooming.   Neurologic / Psychiatric: Oriented to time, oriented to place, oriented to person. No depression, no anxiety, no agitation.  Gastrointestinal: No mass, no tenderness, no rigidity, non obese abdomen. +right CVAT     Complexity of Data:  Records Review:   Previous Doctor Records, Previous Patient Records   11/20/04  PSA  Total PSA 0.61     PROCEDURES:  C.T. Dia Crawford - 80998      Patient confirmed No Neulasta OnPro Device.         Urinalysis w/Scope Dipstick Dipstick Cont'd Micro  Color: Red Bilirubin: Neg mg/dL WBC/hpf: 6 - 10/hpf  Appearance: Cloudy Ketones: Neg mg/dL RBC/hpf: >60/hpf  Specific Gravity: 1.015 Blood: 3+ ery/uL Bacteria: Few (10-25/hpf)  pH: 7.0 Protein: Trace mg/dL Cystals: NS (Not Seen)  Glucose: Neg mg/dL Urobilinogen: 0.2 mg/dL Casts: NS (Not Seen)    Nitrites: Positive Trichomonas: Not Present    Leukocyte Esterase: 2+ leu/uL Mucous: Present      Epithelial Cells: 0 - 5/hpf      Yeast: NS (Not Seen)      Sperm: Not Present         Morphine '4mg'$  - 33825,  [id number] Qty: 4 Adm. By: Cristobal Goldmann  Unit: mg Lot No [id number]  Route: IM Exp. Date 04/17/2023  Freq: None Mfgr.:   Site: Right Buttock   ASSESSMENT:      ICD-10 Details  1 GU:   Flank Pain - R10.84 Right, Undiagnosed New Problem  2   Renal and ureteral calculus - N20.2 Right, Acute, Systemic Symptoms  3   Gross hematuria - R31.0 Acute, Systemic Symptoms  4   Acute Cystitis/UTI - N30.00    PLAN:           Orders Labs Urine Culture  X-Rays: C.T. Stone Protocol Without I.V. Contrast  X-Ray Notes: History:  Hematuria: Yes/No  Patient to see MD after exam: Yes/No  Previous exam: CT / IVP/ US/ KUB/ None  When:  Where:  Diabetic: Yes/ No  BUN/ Creatinine:  Date of last BUN Creatinine:  Weight in pounds:  Allergy- IV Contrast: Yes/ No  Conflicting diabetic meds: Yes/ No  Diabetic Meds:  Prior Authorization #: [id number]            Schedule Return Visit/Planned Activity: ASAP - Schedule Surgery          Document Letter(s):  Created for Patient: Clinical Summary         Notes:   -CTSS today shows a 7 mm right UPJ stone with no associated hydronephrosis. Given his positive UA and acute pain, I will proceed with urgent ureteral stent placement. Final read of CT is pending.   -The risks, benefits and alternatives of cystoscopy with RIGHT JJ stent placement was discussed with the patient. Risks include, but are not limited to: bleeding, urinary tract infection, ureteral injury, ureteral stricture disease, chronic pain, urinary symptoms, bladder injury, stent migration, the need for nephrostomy tube placement, MI, CVA, DVT, PE and the inherent risks with general anesthesia. The patient voices understanding and wishes to proceed.

## 2022-02-22 NOTE — Anesthesia Preprocedure Evaluation (Signed)
Anesthesia Evaluation  Patient identified by MRN, date of birth, ID band Patient awake    Reviewed: Allergy & Precautions, NPO status , Patient's Chart, lab work & pertinent test results  Airway Mallampati: II  TM Distance: >3 FB     Dental   Pulmonary neg pulmonary ROS, former smoker,    breath sounds clear to auscultation       Cardiovascular hypertension, + CAD   Rhythm:Regular Rate:Normal     Neuro/Psych  Neuromuscular disease    GI/Hepatic Neg liver ROS, GERD  ,  Endo/Other  negative endocrine ROS  Renal/GU negative Renal ROS   History noted Dr. Nyoka Cowden    Musculoskeletal  (+) Arthritis ,   Abdominal   Peds  Hematology   Anesthesia Other Findings   Reproductive/Obstetrics                             Anesthesia Physical Anesthesia Plan  ASA: 3  Anesthesia Plan: General   Post-op Pain Management:    Induction: Intravenous  PONV Risk Score and Plan: 2 and Ondansetron and Dexamethasone  Airway Management Planned: LMA  Additional Equipment:   Intra-op Plan:   Post-operative Plan: Extubation in OR  Informed Consent: I have reviewed the patients History and Physical, chart, labs and discussed the procedure including the risks, benefits and alternatives for the proposed anesthesia with the patient or authorized representative who has indicated his/her understanding and acceptance.     Dental advisory given  Plan Discussed with: CRNA and Anesthesiologist  Anesthesia Plan Comments:         Anesthesia Quick Evaluation

## 2022-02-22 NOTE — Anesthesia Postprocedure Evaluation (Signed)
Anesthesia Post Note  [name]  Procedure(s) Performed: CYSTOSCOPY WITH RETROGRADE PYELOGRAM/URETERAL STENT PLACEMENT (Right: Ureter)     Patient location during evaluation: PACU Anesthesia Type: General Level of consciousness: awake Pain management: pain level controlled Respiratory status: spontaneous breathing Cardiovascular status: stable Postop Assessment: no apparent nausea or vomiting Anesthetic complications: no   No notable events documented.  Last Vitals:  Vitals:   02/22/22 1807 02/22/22 1815  BP: (!) 160/95 (!) 162/91  Pulse: 69 67  Resp: 17 15  Temp: 37 C   SpO2: 100% 100%    Last Pain:  Vitals:   02/22/22 1807  TempSrc:   PainSc: 0-No pain                 Quy Lotts

## 2022-02-22 NOTE — Transfer of Care (Signed)
Immediate Anesthesia Transfer of Care Note  [name]  Procedure(s) Performed: CYSTOSCOPY WITH RETROGRADE PYELOGRAM/URETERAL STENT PLACEMENT (Right: Ureter)  Patient Location: PACU  Anesthesia Type:General  Level of Consciousness: sedated  Airway & Oxygen Therapy: Patient Spontanous Breathing and Patient connected to face mask oxygen  Post-op Assessment: Report given to RN and Post -op Vital signs reviewed and stable  Post vital signs: Reviewed and stable  Last Vitals:  Vitals Value Taken Time  BP    Temp    Pulse 65 02/22/22 1805  Resp 17 02/22/22 1805  SpO2 100 % 02/22/22 1805  Vitals shown include unvalidated device data.  Last Pain:  Vitals:   02/22/22 1634  TempSrc: Oral  PainSc: 0-No pain         Complications: No notable events documented.

## 2022-02-22 NOTE — Op Note (Signed)
Operative Note  Preoperative diagnosis:  1.  7 mm right UPJ stone 2.  UTI  Postoperative diagnosis: 1.  7 mm right UPJ stone 2.  UTI  Procedure(s): 1.  Cystoscopy with right ureteral stent placement  Surgeon: Ellison Hughs, MD  Assistants:  None  Anesthesia:  General  Complications:  None  EBL: Less than 5 mL  Specimens: 1.  None  Drains/Catheters: 1.  Right 6 French, 22 cm JJ stent without tether  Intraoperative findings:   Right retrograde pyelogram revealed a filling defect within the proximal aspect of the right ureter, consistent with the obstructing stone seen on recent CT Moderate bladder trabeculation with no other intravesical pathology  Indication:  Brandon [name] is a 76 y.o. male with an obstructing 7 mm right UPJ stone with concomitant UTI.  He has been consented for the above procedures, voices understanding and wishes to proceed.  Description of procedure:  After informed consent was obtained, the patient was brought to the operating room and general LMA anesthesia was administered. The patient was then placed in the dorsolithotomy position and prepped and draped in the usual sterile fashion. A timeout was performed. A 23 French rigid cystoscope was then inserted into the urethral meatus and advanced into the bladder under direct vision. A complete bladder survey revealed no intravesical pathology.  A 5 French ureteral catheter was then inserted into the right ureteral orifice and a retrograde pyelogram was obtained, with the findings listed above.  A Glidewire was then used to intubate the lumen of the ureteral catheter and was advanced up to the right renal pelvis, under fluoroscopic guidance.  The catheter was then removed, leaving the wire in place.  Attempts to place a 6 [place], 24 cm JJ stent, but did not achieve an adequate curl within the renal pelvis or within the bladder due to excessive length of the stent.  The stent was then removed over the  wire and replaced with a new 6 [place], 22 [place] JJ stent that exhibited an excellent curl within the renal pelvis as well as the bladder, confirming placement via fluoroscopy.  The patient's bladder was drained.  He tolerated the procedure well and was transferred to the postanesthesia in stable condition.  Plan: Follow-up with Dr. Jeffie Pollock in 2 weeks for definitive stone management

## 2022-02-22 NOTE — H&P (Signed)
Office Visit Report     02/22/2022   --------------------------------------------------------------------------------   Jenne Pane. Vanriper  MRN: [id number]  DOB: 1945/12/09, 76 year old Male  PRIMARY CARE:  Sharilyn Sites, MD  REFERRING:  Irine Seal, MD  PROVIDER:  Irine Seal, M.D.  TREATING:  Ellison Hughs, M.D.  LOCATION:  [facility] [phone]     --------------------------------------------------------------------------------   CC: I have pain in the flank.  HPI: Gina Costilla is a 76 year-old male established patient who is here for flank pain.  The problem is on both sides. His pain started about 02/21/2022. The pain is sharp. The intensity of his pain is rated as a 5. The pain is intermittent. The pain does radiate.   Resting< makes the pain better. Walking makes the pain worse. He was treated with the following pain medication(s): Hydrocodone.   He has had this same pain previously. He has had kidney stones.   -The patient presents today with a 24 hour history of low back pain with radiation into the left inguinal region and is associated with gross hematuria  -He denies N/V/F/C or dysuria  -AFVSS  -UA today shows sign of cystitis     ALLERGIES: No Allergies    MEDICATIONS: Metoprolol Tartrate 25 mg tablet  Allopurinol 300 mg tablet Oral  Amlodipine Besilate  Aspirin Ec 81 mg tablet, delayed release  Calcium  Fish Oil CAPS Oral  Gabapentin  Mestinon  Multi-Vitamin TABS Oral  Nitroglycerin 0.4 mg tablet, sublingual  Osteo Bi-Flex Regular Strength TABS Oral  Oxycodone Hcl  Pantoprazole Sodium  Pravachol 40 MG Oral Tablet Oral  Vitamin C 500 mg tablet Oral     GU PSH: Cysto Bladder Stone <2.5cm - 10/06/2021 Cysto Remove Stent FB Sim - 10/13/2021 Cystoscopy Ureteroscopy - 2015 ESWL, Right - 2020, 2010 Ureteroscopic laser litho - 10/06/2021 Ureteroscopic stone removal, Left - 2018       PSH Notes: Cystoscopy With Ureteroscopy, Hand Surgery,  CABG (CABG), Knee Replacement, Lithotripsy   NON-GU PSH: Back Surgery (Unspecified) CABG (coronary artery bypass grafting) - 2015 Carpal Tunnel Surgery.. Hip Replacement, Left - 07/29/2020 Revise Knee Joint - 2015     GU PMH: BPH w/LUTS, He is doing well after the stone removal. I will have him try to cut back the tamsulosin. - 12/07/2021, (Improving), - 2018 Nocturia - 12/07/2021, - 2019 Renal calculus, He has bilateral non-obstructing renal stones. I will have him return in 6 months with a KUB. - 12/07/2021, - 07/29/2020 (Stable), He has stable small stones. I will have him return in a year with a KUB., - 2019, Bilateral kidney stones, - 2015 Renal cyst, Bilateral simple cysts. - 12/07/2021, - 07/29/2020, Renal cyst, acquired, - 2014 Renal and ureteral calculus - 10/13/2021, - 09/24/2021, - 09/16/2021, He has left distal ureteral stones with obstruction and that is the likely cause of the hematuria and possibly the left hip pain. He is on tamsulosin and has hydrocodone. I will have him return in 2 weeks for a renal US. If he has not passed the stone, I will consider URS. , - 09/02/2021 (Improving), - 2018 Gross hematuria - 09/24/2021, - 09/02/2021, - 2020 Acute Cystitis/UTI, UA continues to appear infected despite being on appropriate antimicrobial treatment with cephalexin. Repeat urine culture sent today. - 09/16/2021, His UA looks infected but he has no fever or significant symptoms of concern. I will culture the urine and treat as needed but he was warned to contact us should he get a fever  or more severe pain. , - 09/02/2021 Ureteral obstruction secondary to calculous - 09/02/2021 Urinary Frequency - 07/29/2020 Urinary Urgency - 07/29/2020 Ureteral calculus, Right - 2020, Left, - 2017, Calculus of ureter, - 2015 Low back pain, Left-sided low back pain without sciatica - 2015 Bladder-neck stenosis/contracture, Bladder neck contracture - 2014 History of urolithiasis, Nephrolithiasis - 2014      PMH  Notes:  2009-01-16 10:23:44 - Note: Arthritis   NON-GU PMH: Bacteriuria, Urine culture today. - 12/07/2021 Encounter for general adult medical examination without abnormal findings, Encounter for preventive health examination - 2015 Personal history of other diseases of the musculoskeletal system and connective tissue, History of gout - 2015 Personal history of other diseases of the circulatory system, History of hypertension - 2014 Personal history of other diseases of the digestive system, History of esophageal reflux - 2014 Personal history of other endocrine, nutritional and metabolic disease, History of hypercholesterolemia - 2014    FAMILY HISTORY: Cardiac Failure - Mother Death In The Family Father - Father Death In The Family Mother - Mother Family Health Status Number - Runs In Family Heart Disease - Runs In Family Hypertension - Runs In Family Lung Cancer - Father nephrolithiasis - Runs In Family   SOCIAL HISTORY: Marital Status: Married Preferred Language: English; Ethnicity: Not Hispanic Or Latino; Race: White Current Smoking Status: Patient does not smoke anymore.  Social Drinker.  Drinks 2 caffeinated drinks per day. 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     Notes: Occupation:, Caffeine Use, Marital History - Currently Married, Previous History Of Smoking, Alcohol Use   REVIEW OF SYSTEMS:    GU Review Male:   Patient reports burning/ pain with urination. Patient denies frequent urination, hard to postpone urination, get up at night to urinate, leakage of urine, stream starts and stops, trouble starting your stream, have to strain to urinate , erection problems, and penile pain.  Gastrointestinal (Upper):   Patient denies nausea, vomiting, and indigestion/ heartburn.  Gastrointestinal (Lower):   Patient denies diarrhea and constipation.  Constitutional:   Patient denies fatigue, weight loss, fever, and night sweats.  Skin:   Patient denies skin rash/  lesion and itching.  Eyes:   Patient denies blurred vision and double vision.  Ears/ Nose/ Throat:   Patient denies sore throat and sinus problems.  Hematologic/Lymphatic:   Patient denies swollen glands and easy bruising.  Cardiovascular:   Patient denies leg swelling and chest pains.  Respiratory:   Patient denies cough and shortness of breath.  Endocrine:   Patient denies excessive thirst.  Musculoskeletal:   Patient reports back pain. Patient denies joint pain.  Neurological:   Patient denies headaches and dizziness.  Psychologic:   Patient denies depression and anxiety.   Notes: Back pain radiating to groin, Gross hematuria    VITAL SIGNS:      02/22/2022 09:36 AM  Weight 180 lb / 81.65 kg  BP 136/82 mmHg  Pulse 68 /min  Temperature 97.5 F / 36.3 C   MULTI-SYSTEM PHYSICAL EXAMINATION:    Constitutional: Well-nourished. No physical deformities. Normally developed. Good grooming.   Neurologic / Psychiatric: Oriented to time, oriented to place, oriented to person. No depression, no anxiety, no agitation.  Gastrointestinal: No mass, no tenderness, no rigidity, non obese abdomen. +right CVAT     Complexity of Data:  Records Review:   Previous Doctor Records, Previous Patient Records   11/20/04  PSA  Total PSA 0.61     PROCEDURES:  C.T. Dia Crawford - [id number]      Patient confirmed No Neulasta OnPro Device.         Urinalysis w/Scope Dipstick Dipstick Cont'd Micro  Color: Red Bilirubin: Neg mg/dL WBC/hpf: 6 - 10/hpf  Appearance: Cloudy Ketones: Neg mg/dL RBC/hpf: >60/hpf  Specific Gravity: 1.015 Blood: 3+ ery/uL Bacteria: Few (10-25/hpf)  pH: 7.0 Protein: Trace mg/dL Cystals: NS (Not Seen)  Glucose: Neg mg/dL Urobilinogen: 0.2 mg/dL Casts: NS (Not Seen)    Nitrites: Positive Trichomonas: Not Present    Leukocyte Esterase: 2+ leu/uL Mucous: Present      Epithelial Cells: 0 - 5/hpf      Yeast: NS (Not Seen)      Sperm: Not Present         Morphine '4mg'$  - 80998,  P3825 Qty: 4 Adm. By: Cristobal Goldmann  Unit: mg Lot No [id number]  Route: IM Exp. Date 04/17/2023  Freq: None Mfgr.:   Site: Right Buttock   ASSESSMENT:      ICD-10 Details  1 GU:   Flank Pain - R10.84 Right, Undiagnosed New Problem  2   Renal and ureteral calculus - N20.2 Right, Acute, Systemic Symptoms  3   Gross hematuria - R31.0 Acute, Systemic Symptoms  4   Acute Cystitis/UTI - N30.00    PLAN:           Orders Labs Urine Culture  X-Rays: C.T. Stone Protocol Without I.V. Contrast  X-Ray Notes: History:  Hematuria: Yes/No  Patient to see MD after exam: Yes/No  Previous exam: CT / IVP/ US/ KUB/ None  When:  Where:  Diabetic: Yes/ No  BUN/ Creatinine:  Date of last BUN Creatinine:  Weight in pounds:  Allergy- IV Contrast: Yes/ No  Conflicting diabetic meds: Yes/ No  Diabetic Meds:  Prior Authorization #: [id number]            Schedule Return Visit/Planned Activity: ASAP - Schedule Surgery          Document Letter(s):  Created for Patient: Clinical Summary         Notes:   -CTSS today shows a 7 mm right UPJ stone with no associated hydronephrosis. Given his positive UA and acute pain, I will proceed with urgent ureteral stent placement. Final read of CT is pending.   -The risks, benefits and alternatives of cystoscopy with RIGHT JJ stent placement was discussed with the patient. Risks include, but are not limited to: bleeding, urinary tract infection, ureteral injury, ureteral stricture disease, chronic pain, urinary symptoms, bladder injury, stent migration, the need for nephrostomy tube placement, MI, CVA, DVT, PE and the inherent risks with general anesthesia. The patient voices understanding and wishes to proceed.

## 2022-02-23 ENCOUNTER — Ambulatory Visit: Payer: Medicare Other | Admitting: Physical Therapy

## 2022-02-23 ENCOUNTER — Encounter (HOSPITAL_COMMUNITY): Payer: Self-pay | Admitting: Urology

## 2022-02-24 ENCOUNTER — Other Ambulatory Visit: Payer: Self-pay | Admitting: Urology

## 2022-02-24 ENCOUNTER — Ambulatory Visit: Admit: 2022-02-24 | Payer: Medicare Other | Admitting: Urology

## 2022-02-24 SURGERY — CYSTOSCOPY, FLEXIBLE, WITH STENT REPLACEMENT
Anesthesia: General | Laterality: Right

## 2022-02-24 NOTE — Progress Notes (Signed)
For Short Stay: [place] appointment date: Date of COVID positive in last 25 days:  Bowel Prep reminder:   For Anesthesia: PCP - Sharilyn Sites, MD Cardiologist -   Chest x-ray -  EKG -  Stress Test -  ECHO -  Cardiac Cath -  Pacemaker/ICD device last checked: Pacemaker orders received: Device Rep notified:  Spinal Cord Stimulator:  Sleep Study -  CPAP -   Fasting Blood Sugar -  Checks Blood Sugar _____ times a day Date and result of last Hgb A1c-  Blood Thinner Instructions: Aspirin Instructions: Last Dose:  Activity level: Can go up a flight of stairs and activities of daily living without stopping and without chest pain and/or shortness of breath   Able to exercise without chest pain and/or shortness of breath   Unable to go up a flight of stairs without chest pain and/or shortness of breath     Anesthesia review: CAD, CABG, History of A fib after surgery, HTN, History of Myasthenia Gravis  Patient denies shortness of breath, fever, cough and chest pain at PAT appointment   Patient verbalized understanding of instructions that were given to them at the PAT appointment. Patient was also instructed that they will need to review over the PAT instructions again at home before surgery.

## 2022-02-25 ENCOUNTER — Encounter (HOSPITAL_COMMUNITY): Payer: Self-pay | Admitting: Urology

## 2022-02-25 ENCOUNTER — Other Ambulatory Visit: Payer: Self-pay

## 2022-03-01 ENCOUNTER — Ambulatory Visit (HOSPITAL_COMMUNITY): Payer: Medicare Other | Admitting: Physician Assistant

## 2022-03-01 ENCOUNTER — Ambulatory Visit (HOSPITAL_BASED_OUTPATIENT_CLINIC_OR_DEPARTMENT_OTHER): Payer: Medicare Other | Admitting: Physician Assistant

## 2022-03-01 ENCOUNTER — Ambulatory Visit (HOSPITAL_COMMUNITY): Payer: Medicare Other

## 2022-03-01 ENCOUNTER — Ambulatory Visit (HOSPITAL_COMMUNITY)
Admission: RE | Admit: 2022-03-01 | Discharge: 2022-03-01 | Disposition: A | Discharge status: Home / Self Care | Payer: Medicare Other | Source: Ambulatory Visit | Attending: Urology | Admitting: Urology

## 2022-03-01 ENCOUNTER — Encounter (HOSPITAL_COMMUNITY): Payer: Self-pay | Admitting: Urology

## 2022-03-01 ENCOUNTER — Encounter (HOSPITAL_COMMUNITY)
Admission: RE | Disposition: A | Discharge status: Home / Self Care | Payer: Self-pay | Source: Ambulatory Visit | Attending: Urology

## 2022-03-01 ENCOUNTER — Ambulatory Visit: Payer: Medicare Other | Admitting: Physical Therapy

## 2022-03-01 DIAGNOSIS — I10 Essential (primary) hypertension: Secondary | ICD-10-CM

## 2022-03-01 DIAGNOSIS — K219 Gastro-esophageal reflux disease without esophagitis: Secondary | ICD-10-CM | POA: Diagnosis not present

## 2022-03-01 DIAGNOSIS — G7000 Myasthenia gravis without (acute) exacerbation: Secondary | ICD-10-CM | POA: Insufficient documentation

## 2022-03-01 DIAGNOSIS — Z87891 Personal history of nicotine dependence: Secondary | ICD-10-CM

## 2022-03-01 DIAGNOSIS — I2510 Atherosclerotic heart disease of native coronary artery without angina pectoris: Secondary | ICD-10-CM | POA: Diagnosis not present

## 2022-03-01 DIAGNOSIS — M109 Gout, unspecified: Secondary | ICD-10-CM | POA: Insufficient documentation

## 2022-03-01 DIAGNOSIS — N289 Disorder of kidney and ureter, unspecified: Secondary | ICD-10-CM

## 2022-03-01 DIAGNOSIS — N201 Calculus of ureter: Secondary | ICD-10-CM | POA: Insufficient documentation

## 2022-03-01 DIAGNOSIS — Z951 Presence of aortocoronary bypass graft: Secondary | ICD-10-CM | POA: Diagnosis not present

## 2022-03-01 DIAGNOSIS — N202 Calculus of kidney with calculus of ureter: Secondary | ICD-10-CM

## 2022-03-01 HISTORY — PX: CYSTOSCOPY/URETEROSCOPY/HOLMIUM LASER/STENT PLACEMENT: SHX6546

## 2022-03-01 SURGERY — CYSTOSCOPY/URETEROSCOPY/HOLMIUM LASER/STENT PLACEMENT
Anesthesia: General | Laterality: Right

## 2022-03-01 MED ORDER — FENTANYL CITRATE (PF) 100 MCG/2ML IJ SOLN
INTRAMUSCULAR | Status: DC | PRN
  Administered 2022-03-01 (×2): 50 ug via INTRAVENOUS

## 2022-03-01 MED ORDER — ACETAMINOPHEN 500 MG PO TABS
1000.0000 mg | ORAL_TABLET | Freq: Once | ORAL | Status: AC
  Administered 2022-03-01: 1000 mg via ORAL
  Filled 2022-03-01: qty 2

## 2022-03-01 MED ORDER — LACTATED RINGERS IV SOLN: INTRAVENOUS | Status: DC

## 2022-03-01 MED ORDER — PROMETHAZINE HCL 25 MG/ML IJ SOLN: 6.2500 mg | INTRAMUSCULAR | Status: DC | PRN

## 2022-03-01 MED ORDER — FENTANYL CITRATE (PF) 100 MCG/2ML IJ SOLN
INTRAMUSCULAR | Status: AC
  Filled 2022-03-01: qty 2

## 2022-03-01 MED ORDER — 0.9 % SODIUM CHLORIDE (POUR BTL) OPTIME
TOPICAL | Status: DC | PRN
  Administered 2022-03-01: 1000 mL

## 2022-03-01 MED ORDER — LIDOCAINE HCL (PF) 2 % IJ SOLN
INTRAMUSCULAR | Status: AC
  Filled 2022-03-01: qty 5

## 2022-03-01 MED ORDER — FENTANYL CITRATE PF 50 MCG/ML IJ SOSY: 25.0000 ug | PREFILLED_SYRINGE | INTRAMUSCULAR | Status: DC | PRN

## 2022-03-01 MED ORDER — CELECOXIB 200 MG PO CAPS
200.0000 mg | ORAL_CAPSULE | Freq: Once | ORAL | Status: AC
  Administered 2022-03-01: 200 mg via ORAL
  Filled 2022-03-01: qty 1

## 2022-03-01 MED ORDER — EPHEDRINE 5 MG/ML INJ
INTRAVENOUS | Status: AC
  Filled 2022-03-01: qty 5

## 2022-03-01 MED ORDER — PROPOFOL 10 MG/ML IV BOLUS
INTRAVENOUS | Status: AC
  Filled 2022-03-01: qty 20

## 2022-03-01 MED ORDER — EPHEDRINE SULFATE-NACL 50-0.9 MG/10ML-% IV SOSY
PREFILLED_SYRINGE | INTRAVENOUS | Status: DC | PRN
  Administered 2022-03-01 (×2): 5 mg via INTRAVENOUS

## 2022-03-01 MED ORDER — AMISULPRIDE (ANTIEMETIC) 5 MG/2ML IV SOLN: 10.0000 mg | Freq: Once | INTRAVENOUS | Status: DC | PRN

## 2022-03-01 MED ORDER — SODIUM CHLORIDE 0.9 % IR SOLN
Status: DC | PRN
  Administered 2022-03-01: 3000 mL via INTRAVESICAL

## 2022-03-01 MED ORDER — ORAL CARE MOUTH RINSE: 15.0000 mL | Freq: Once | OROMUCOSAL | Status: AC

## 2022-03-01 MED ORDER — LIDOCAINE HCL (CARDIAC) PF 100 MG/5ML IV SOSY
PREFILLED_SYRINGE | INTRAVENOUS | Status: DC | PRN
  Administered 2022-03-01: 100 mg via INTRAVENOUS

## 2022-03-01 MED ORDER — ONDANSETRON HCL 4 MG/2ML IJ SOLN
INTRAMUSCULAR | Status: DC | PRN
  Administered 2022-03-01: 4 mg via INTRAVENOUS

## 2022-03-01 MED ORDER — SODIUM CHLORIDE 0.9% FLUSH
3.0000 mL | Freq: Two times a day (BID) | INTRAVENOUS | Status: DC
Start: 2022-03-01 — End: 2022-03-01

## 2022-03-01 MED ORDER — PROPOFOL 10 MG/ML IV BOLUS
INTRAVENOUS | Status: DC | PRN
  Administered 2022-03-01: 140 mg via INTRAVENOUS

## 2022-03-01 MED ORDER — CHLORHEXIDINE GLUCONATE 0.12 % MT SOLN
15.0000 mL | Freq: Once | OROMUCOSAL | Status: AC
Start: 2022-03-01 — End: 2022-03-01
  Administered 2022-03-01: 15 mL via OROMUCOSAL

## 2022-03-01 MED ORDER — CEFAZOLIN SODIUM-DEXTROSE 2-4 GM/100ML-% IV SOLN
2.0000 g | INTRAVENOUS | Status: AC
  Administered 2022-03-01: 2 g via INTRAVENOUS
  Filled 2022-03-01: qty 100

## 2022-03-01 MED ORDER — DEXAMETHASONE SODIUM PHOSPHATE 10 MG/ML IJ SOLN
INTRAMUSCULAR | Status: DC | PRN
  Administered 2022-03-01: 8 mg via INTRAVENOUS

## 2022-03-01 SURGICAL SUPPLY — 25 items
BAG URO CATCHER STRL LF (MISCELLANEOUS) ×2 IMPLANT
BASKET STONE NCOMPASS (UROLOGICAL SUPPLIES) IMPLANT
CATH URETERAL DUAL LUMEN 10F (MISCELLANEOUS) IMPLANT
CATH URETL OPEN 5X70 (CATHETERS) IMPLANT
CLOTH BEACON ORANGE TIMEOUT ST (SAFETY) ×2 IMPLANT
EXTRACTOR STONE 1.7FRX115CM (UROLOGICAL SUPPLIES) ×1 IMPLANT
EXTRACTOR STONE NITINOL NGAGE (UROLOGICAL SUPPLIES) ×1 IMPLANT
GLOVE SURG SS PI 8.0 STRL IVOR (GLOVE) ×2 IMPLANT
GOWN STRL REUS W/ TWL XL LVL3 (GOWN DISPOSABLE) ×1 IMPLANT
GOWN STRL REUS W/TWL XL LVL3 (GOWN DISPOSABLE) ×2
GUIDEWIRE STR DUAL SENSOR (WIRE) ×2 IMPLANT
IV NS IRRIG 3000ML ARTHROMATIC (IV SOLUTION) ×2 IMPLANT
KIT TURNOVER KIT A (KITS) IMPLANT
LASER FIB FLEXIVA PULSE ID 365 (Laser) IMPLANT
LASER FIB FLEXIVA PULSE ID 550 (Laser) IMPLANT
LASER FIB FLEXIVA PULSE ID 910 (Laser) IMPLANT
MANIFOLD NEPTUNE II (INSTRUMENTS) ×2 IMPLANT
PACK CYSTO (CUSTOM PROCEDURE TRAY) ×2 IMPLANT
SHEATH NAV HD 11/13X46 (SHEATH) ×1 IMPLANT
SHEATH NAVIGATOR HD 11/13X36 (SHEATH) IMPLANT
STENT CONTOUR 6FRX26X.038 (STENTS) ×1 IMPLANT
TRACTIP FLEXIVA PULS ID 200XHI (Laser) IMPLANT
TRACTIP FLEXIVA PULSE ID 200 (Laser) ×1 IMPLANT
TUBING CONNECTING 10 (TUBING) ×2 IMPLANT
TUBING UROLOGY SET (TUBING) ×2 IMPLANT

## 2022-03-01 NOTE — Anesthesia Preprocedure Evaluation (Addendum)
Anesthesia Evaluation  Patient identified by MRN, date of birth, ID band Patient awake    Reviewed: Allergy & Precautions, NPO status , Patient's Chart, lab work & pertinent test results  History of Anesthesia Complications Negative for: history of anesthetic complications  Airway Mallampati: II  TM Distance: >3 FB Neck ROM: Full    Dental no notable dental hx. (+) Dental Advisory Given   Pulmonary former smoker,    Pulmonary exam normal        Cardiovascular hypertension, Pt. on medications and Pt. on home beta blockers + CAD and + CABG  Normal cardiovascular exam  ECG: NSR, rate 68   Neuro/Psych Myasthenia Gravis  Neuromuscular disease negative psych ROS   GI/Hepatic Neg liver ROS, GERD  Medicated and Controlled,  Endo/Other  negative endocrine ROS  Renal/GU negative Renal ROS     Musculoskeletal  (+) Arthritis , Gout   Abdominal   Peds  Hematology negative hematology ROS (+)   Anesthesia Other Findings   Reproductive/Obstetrics                            Anesthesia Physical  Anesthesia Plan  ASA: 3  Anesthesia Plan: General   Post-op Pain Management: Celebrex PO (pre-op)* and Tylenol PO (pre-op)*   Induction: Intravenous  PONV Risk Score and Plan: 2 and Ondansetron, Dexamethasone and Treatment may vary due to age or medical condition  Airway Management Planned: LMA  Additional Equipment:   Intra-op Plan:   Post-operative Plan: Extubation in OR  Informed Consent: I have reviewed the patients History and Physical, chart, labs and discussed the procedure including the risks, benefits and alternatives for the proposed anesthesia with the patient or authorized representative who has indicated his/her understanding and acceptance.     Dental advisory given  Plan Discussed with: Anesthesiologist and CRNA  Anesthesia Plan Comments:        Anesthesia Quick  Evaluation

## 2022-03-01 NOTE — Discharge Instructions (Signed)
You may pull the stent using the attached string on Friday morning.   If you don't feel you can do this, please contact the office for a time to have it done.   Bring your stone fragments to the office to your appointment.

## 2022-03-01 NOTE — Anesthesia Postprocedure Evaluation (Signed)
Anesthesia Post Note  [name]  Procedure(s) Performed: CYSTOSCOPY RIGHT URETEROSCOPY/HOLMIUM LASER/STENT PLACEMENT (Right)     Patient location during evaluation: PACU Anesthesia Type: General Level of consciousness: sedated Pain management: pain level controlled Vital Signs Assessment: post-procedure vital signs reviewed and stable Respiratory status: spontaneous breathing and respiratory function stable Cardiovascular status: stable Postop Assessment: no apparent nausea or vomiting Anesthetic complications: no   No notable events documented.  Last Vitals:  Vitals:   03/01/22 1800 03/01/22 1806  BP:  (!) 157/92  Pulse: (!) 46   Resp: 13   Temp: 36.7 C 36.7 C  SpO2: 93% 97%    Last Pain:  Vitals:   03/01/22 1800  TempSrc:   PainSc: 0-No pain                 Deborrah Mabin DANIEL

## 2022-03-01 NOTE — Interval H&P Note (Signed)
History and Physical Interval Note:  03/01/2022 4:10 PM  Brandon [name]  has presented today for surgery, with the diagnosis of RIGHT URETERAL PELVIC JUNCTION STONE.  The various methods of treatment have been discussed with the patient and family. After consideration of risks, benefits and other options for treatment, the patient has consented to  Procedure(s): CYSTOSCOPY RIGHT URETEROSCOPY/HOLMIUM LASER/STENT PLACEMENT (Right) as a surgical intervention.  The patient's history has been reviewed, patient examined, no change in status, stable for surgery.  I have reviewed the patient's chart and labs.  Questions were answered to the patient's satisfaction.     Irine Seal

## 2022-03-01 NOTE — Transfer of Care (Signed)
Immediate Anesthesia Transfer of Care Note  [name]  Procedure(s) Performed: CYSTOSCOPY RIGHT URETEROSCOPY/HOLMIUM LASER/STENT PLACEMENT (Right)  Patient Location: PACU  Anesthesia Type:General  Level of Consciousness: awake, alert , oriented, drowsy and patient cooperative  Airway & Oxygen Therapy: Patient Spontanous Breathing  Post-op Assessment: Report given to RN and Post -op Vital signs reviewed and stable  Post vital signs: Reviewed and stable  Last Vitals:  Vitals Value Taken Time  BP 158/73 1719  Temp    Pulse 54 03/01/22 1719  Resp 12 03/01/22 1719  SpO2 96 % 03/01/22 1719  Vitals shown include unvalidated device data.  Last Pain:  Vitals:   03/01/22 1343  TempSrc: Oral  PainSc:       Patients Stated Pain Goal: 3 (72/76/18 4859)  Complications: No notable events documented.

## 2022-03-01 NOTE — Anesthesia Procedure Notes (Signed)
Procedure Name: LMA Insertion Date/Time: 03/01/2022 4:25 PM  Performed by: Raenette Rover, CRNAPre-anesthesia Checklist: Patient identified, Emergency Drugs available, Suction available and Patient being monitored Patient Re-evaluated:Patient Re-evaluated prior to induction Oxygen Delivery Method: Circle system utilized Preoxygenation: Pre-oxygenation with 100% oxygen Induction Type: IV induction LMA: LMA inserted LMA Size: 5.0 Number of attempts: 1 Placement Confirmation: positive ETCO2 and breath sounds checked- equal and bilateral Tube secured with: Tape Dental Injury: Teeth and Oropharynx as per pre-operative assessment

## 2022-03-01 NOTE — Op Note (Signed)
Procedure: 1.  Cystoscopy with right ureteroscopic stone extraction with holmium laser application and exchange of right double-J stent. 2.  Application of fluoroscopy.  Preop diagnosis: 6 mm left UPJ stone.  Postop diagnosis: Same.  Surgeon: Dr. Irine Seal.  Anesthesia: General.  Specimen: Stone fragments.  Drain: 6 [place] by 26 cm right contour double-J stent with tether.  EBL: None.  Complications: None.  Indications: The patient is a 76 year old male who has a 6 mm right proximal stone with obstruction.  He underwent stenting on 7/7 for possible UTI and remains on Keflex.  He returns today for definitive stone management.  Procedure: He was taken operating room was given 2 g of Ancef.  A general anesthetic was induced.  He was placed in lithotomy position and fitted with PAS hose.  His perineum and genitalia were prepped with Betadine solution and draped in usual sterile fashion.  Cystoscopy was performed using a 21 [place] scope and 30 degree lens.  Examination revealed a normal urethra.  The external sphincter was intact.  The prostatic urethra was short with bilobar hyperplasia with some coaptation.  Examination of the bladder demonstrated a stent coil at the right ureteral orifice.  Left ureteral orifice was unremarkable the bladder wall had mild trabeculation but no significant mucosal lesions.  The stent was grasped with a grasping forcep and pulled to the urethral meatus.  A sensor wire was then advanced through the stent to the kidney and the stent was removed.  A 46 cm 11/13 French digital access sheath was then advanced over the wire to the level of the stone and the inner core and wire removed.  A dual-lumen digital flexible scope was then passed and the stone was visualized.  The stone was pushed back into the renal pelvis and a 200 m tract tip laser fiber was used to initiate fragmentation once the stone had been grasped with an engage basket.  The stone was larger than  anticipated and required for amount of effort to fragment and retrieve all the fragments.  But eventually I was able to leave only a little bit of grit from the fragmented stone and remove the remainder.  Final inspection of the collecting system revealed no significant residual stone material.  The ureteroscope was removed and a sensor wire was reinserted through the sheath to the kidney.  The sheath was removed and the cystoscope was reinserted over the wire.  A 6 French by 26 cm contour double-J stent with tether was advanced the kidney under fluoroscopic guidance.  The wire was removed, leaving good coil in the kidney and a good coil in the bladder.  The stent string was left exiting urethra and was secured to the patient's penis with pink tape.  He was taken down for lithotomy position, his anesthetic was reversed and he was moved recovery in stable condition.  There are no complications.  His wife was given the stone fragments to bring to the office at follow-up.  After he arrived in the PACU it was noted that his stent had become dislodged with approximately 2 to 3 inches at the urethral meatus.  Because of this I went ahead and remove the stent to the rest of the way.  Since he been pretty stented and his ureter dilated easily and minimal residual stone material was left I felt it was safe to just remove the stent instead of try returning to the operating room for replacement.

## 2022-03-02 ENCOUNTER — Ambulatory Visit: Payer: Medicare Other | Admitting: Physical Therapy

## 2022-03-02 ENCOUNTER — Encounter (HOSPITAL_COMMUNITY): Payer: Self-pay | Admitting: Urology

## 2022-03-09 ENCOUNTER — Ambulatory Visit: Payer: Medicare Other | Attending: Family Medicine | Admitting: Physical Therapy

## 2022-03-09 DIAGNOSIS — R2689 Other abnormalities of gait and mobility: Secondary | ICD-10-CM | POA: Insufficient documentation

## 2022-03-09 DIAGNOSIS — R2681 Unsteadiness on feet: Secondary | ICD-10-CM | POA: Insufficient documentation

## 2022-03-09 DIAGNOSIS — M6281 Muscle weakness (generalized): Secondary | ICD-10-CM | POA: Diagnosis not present

## 2022-03-09 DIAGNOSIS — M25552 Pain in left hip: Secondary | ICD-10-CM | POA: Insufficient documentation

## 2022-03-09 NOTE — Therapy (Signed)
OUTPATIENT PHYSICAL THERAPY NEURO EVALUATION   Patient Name: Brandon [name] MRN: [id number] DOB:1945-10-19, 76 y.o., male Today's Date: 03/10/2022   PCP: Sharilyn Sites, MD REFERRING PROVIDER: Florinda Marker    PT End of Session - 03/10/22 0957     Visit Number 3    Number of Visits 13    Date for PT Re-Evaluation 04/02/22   pushed out due to scheduling   Authorization Type Medicare    Authorization Time Period 02-08-22 - 05-09-22    PT Start Time 1020    PT Stop Time 1100    PT Time Calculation (min) 40 min    Equipment Utilized During Treatment --   pool noodle, small bar bells, aquatic cuffs   Activity Tolerance Patient tolerated treatment well    Behavior During Therapy [facility] for tasks assessed/performed              Past Medical History:  Diagnosis Date   Arthritis    Coronary atherosclerosis of native coronary artery    Multivessel status post CABG March 2015   DDD (degenerative disc disease), lumbar    Essential hypertension    GERD (gastroesophageal reflux disease)    Hard of hearing both ears    Left ear is worst   Hemorrhoids    History of gout    History of kidney stones    history of myasthenia Gravis 2021   History of [facility] spotted fever    Left ureteral  & renal stone    Lumbago    Mixed hyperlipidemia    Personal history of COVID-19 08/10/2021   Paxlovid   Plantar fascial fibromatosis    Postoperative atrial fibrillation (HCC)    Skin Cancer ([place])    Use of cane as ambulatory aid    Uses roller walker    UTI (urinary tract infection) 09/16/2021   Wears glasses    Past Surgical History:  Procedure Laterality Date   AMPUTATION Right 03/27/2014   Procedure: DEBRIDEMENT AND CLOSURE RIGHT INDEX FINGER;  Surgeon: [name], MD;  Location: [place];  Service: Orthopedics;  Laterality: Right;   BACK SURGERY     lower not sure when per wife on 10-01-2021   BICEPT TENODESIS  11/26/2011   Procedure: BICEPT TENODESIS;  Surgeon: Augustin Schooling, MD;  Location: [place];  Service: Orthopedics;  Laterality: Left;   BIOPSY  08/03/2018   Procedure: BIOPSY;  Surgeon: Rogene Houston, MD;  [facility];  Service: Endoscopy;;  antral   Cataract surgery Bilateral    yrs ago per wife on 10-01-2021   CHOLECYSTECTOMY N/A 09/22/2018   Procedure: LAPAROSCOPIC CHOLECYSTECTOMY;  Surgeon: Virl Cagey, MD;  [facility]: [facility];  Service: General;  Laterality: N/A;   COLONOSCOPY N/A 12/26/2014   Procedure: COLONOSCOPY;  Surgeon: Rogene Houston, MD;  [facility];  Service: Endoscopy;  Laterality: N/A;  730   CORONARY ARTERY BYPASS GRAFT N/A 10/19/2013   Procedure: CORONARY ARTERY BYPASS GRAFTING (CABG);  Surgeon: Rexene Alberts, MD;  Location: [place];  Service: Open Heart Surgery;  Laterality: N/A;  CABG times four using left internal mammary artery and left leg saphenous vein, incision made on right leg but no vein removed   CYSTOSCOPY W/ URETERAL STENT PLACEMENT Right 02/22/2022   Procedure: CYSTOSCOPY WITH RETROGRADE PYELOGRAM/URETERAL STENT PLACEMENT;  Surgeon: Ceasar Mons, MD;  [facility];  Service: Urology;  Laterality: Right;   CYSTOSCOPY WITH RETROGRADE PYELOGRAM, URETEROSCOPY AND STENT  PLACEMENT Left 08/24/2016   Procedure: CYSTOSCOPY WITH LEFT RETROGRADE PYELOGRAM, LEFT URETEROSCOPY, BASKET EXTRACTION LEFT URETERAL STONE;  Surgeon: Irine Seal, MD;  [facility];  Service: Urology;  Laterality: Left;   CYSTOSCOPY/URETEROSCOPY/HOLMIUM LASER/STENT PLACEMENT Left 10/06/2021   Procedure: CYSTOSCOPY LEFT RETROGRADE PYELOGRAM URETEROSCOPY/HOLMIUM LASER/STENT PLACEMENT;  Surgeon: Irine Seal, MD;  Location: [facility];  Service: Urology;  Laterality: Left;   CYSTOSCOPY/URETEROSCOPY/HOLMIUM LASER/STENT PLACEMENT Right 03/01/2022   Procedure: CYSTOSCOPY RIGHT URETEROSCOPY/HOLMIUM LASER/STENT PLACEMENT;  Surgeon: Irine Seal, MD;  [facility];  Service: Urology;  Laterality: Right;    ESOPHAGOGASTRODUODENOSCOPY N/A 08/03/2018   Procedure: ESOPHAGOGASTRODUODENOSCOPY (EGD);  Surgeon: Rogene Houston, MD;  [facility];  Service: Endoscopy;  Laterality: N/A;  2:55   EXTRACORPOREAL SHOCK WAVE LITHOTRIPSY Right 06/18/2019   Procedure: EXTRACORPOREAL SHOCK WAVE LITHOTRIPSY (ESWL);  Surgeon: Lucas Mallow, MD;  [facility];  Service: Urology;  Laterality: Right;   INTRAOPERATIVE TRANSESOPHAGEAL ECHOCARDIOGRAM N/A 10/19/2013   Procedure: INTRAOPERATIVE TRANSESOPHAGEAL ECHOCARDIOGRAM;  Surgeon: Rexene Alberts, MD;  Location: [place];  Service: Open Heart Surgery;  Laterality: N/A;   KYPHOPLASTY N/A 03/27/2021   Procedure: Lumbar One and Lumbar Two Kyphoplasty;  Surgeon: Erline Levine, MD;  Location: [place];  Service: Neurosurgery;  Laterality: N/A;   left hand carpal tunnel release  04/04/2015   LEFT HEART CATHETERIZATION WITH CORONARY ANGIOGRAM N/A 10/09/2013   Procedure: LEFT HEART CATHETERIZATION WITH CORONARY ANGIOGRAM;  Surgeon: Burnell Blanks, MD;  Location[facility] CATH [facility];  Service: Cardiovascular;  Laterality: N/A;   Left shoulder rotator cuff repair  12/04/2011   MOHS procedure  01/06/2021   dr Harl Bowie on left hand   ORIF PERIPROSTHETIC FRACTURE Left 03/19/2020   Procedure: OPEN REDUCTION INTERNAL FIXATION (ORIF) PERIPROSTHETIC FRACTURE LEFT FEMUR WITH FEMORAL REVISION;  Surgeon: Gaynelle Arabian, MD;  [facility];  Service: Orthopedics;  Laterality: Left;   TOTAL HIP ARTHROPLASTY Left 01/28/2020   Procedure: TOTAL HIP ARTHROPLASTY ANTERIOR APPROACH;  Surgeon: Gaynelle Arabian, MD;  [facility];  Service: Orthopedics;  Laterality: Left;  180mn   TOTAL KNEE ARTHROPLASTY Left 04/18/2001   traumaticvamputation of finger  2015   VASECTOMY     yrs ago per wife on 10-01-2021   Patient Active Problem List   Diagnosis Date Noted   Fracture, lumbar vertebra, compression ([facility] 03/27/2021   Atrial fibrillation with RVR ([place]     Periprosthetic fracture around internal prosthetic left hip joint ([facility] 03/19/2020   OA (osteoarthritis) of hip 01/28/2020   Closed displaced fracture of left femoral neck ([place] 01/28/2020   History of colonic polyps 11/12/2019   GERD (gastroesophageal reflux disease) 11/06/2019   Myasthenia gravis with (acute) exacerbation ([place] 11/06/2019   Acute diarrhea 09/17/2019   Heme positive stool 09/17/2019   Unspecified osteoarthritis, unspecified site    Hyperlipidemia, unspecified    Personal history of other diseases of the musculoskeletal system and connective tissue    Atherosclerotic heart disease of native coronary artery without angina pectoris    Hypertensive heart disease without heart failure    Melena    Plantar fascial fibromatosis    Pleurodynia    Calculus of gallbladder without cholecystitis without obstruction 09/14/2018   Nausea without vomiting 06/05/2018   Postoperative atrial fibrillation ([place] 11/09/2013   S/P CABG x 4 10/19/2013   Coronary atherosclerosis of native coronary artery 10/18/2013   Essential hypertension, benign 09/18/2013   Family history of hypertrophic cardiomyopathy 09/18/2013   Mixed hyperlipidemia  ONSET DATE: 12-29-21 (date of L2-3 MED surgery)  REFERRING DIAG: V74.827 (ICD-10-CM) - Presence of left artificial hip joint ; Lt trochanteric bursitis  THERAPY DIAG:  Pain in left hip  Muscle weakness (generalized)  Unsteadiness on feet  Rationale for Evaluation and Treatment Rehabilitation  SUBJECTIVE:                                                                                                                                                                                              SUBJECTIVE STATEMENT: Pt returns to PT after cancellations due to having had cytoscopy surgery in early July with stent implanted due to still having kidney stones. MD instructed to hold PT for 2 weeks after stent implant.   Pt's last attended appt was  aquatic therapy session on 02-10-22; pt reports he is now having much less pain and is doing much better.  Pt reports he was able to step up onto a high step on some machinery to operate it yesterday, whereas, in the past he has had to use a step to get onto the machine.  Pt accompanied by:  wife, [name]  PERTINENT HISTORY: Mr. Zechman is a 76 year old male with PMH of Myasthenia Gravis, osteoporosis, CAD who presented to [facility] on 12/11/21 for initial neurosurgical evaluation of low back pain referred by Dr. Sharilyn Sites. He has a history of an L3-S1 fusion with Dr. Glenna Fellows in 2016 (retired) as well as L1/L2 kyphoplasty on 03/27/21 with Dr. Erline Levine.  He reported good relief with lumbar fusion until the last 3 years.  He was diagnosed with myasthenia gravis in 2021 and was placed on steroids.  He feels that this contributed to to his osteoporosis.  He reported low back pain that radiated to his left buttock and groin.  Pain was sharp rated as 10 out of 10 at its worst.  Pain was worse with lying, standing or walking.  He was taking hydrocodone prescribed by his PCP which does not really help.  He reported lower extremity weakness and lower extremity aching.  He had tried ESI with minimal relief.  He has a history of a left hip replacement. He is s/p left L2-3 MED on 12/29/21 with Dr. Linus Mako. [place]. Pt also has Myasthenia Gravis - diagnosed in Jan. 2021   PAIN:  Are you having pain? Yes low back and Lt hip Low back 3/10 with weight bearing; no pain in sitting;  hip pain 1-2/10 with weight bearing; no pain in sitting  Sitting helps the back pain; walking helps the hip pain - trying to walk 10'-15' 2-3  times a day Aggravating factors - weight bearing for back; pressure aggravates the hip  PRECAUTIONS: None  WEIGHT BEARING RESTRICTIONS No  FALLS: Has patient fallen in last 6 months? No  LIVING ENVIRONMENT: Lives with: lives with their family Lives in: House/apartment Stairs: Yes:  External: 3 steps; on right going up Has following equipment at home: Single point cane and Walker - 2 wheeled  PLOF: Independent  PATIENT GOALS decrease Lt hip and low back pain   OBJECTIVE:   03-09-22:    TherEx:   Pt was instructed in HEP ([place]) for hip strengthening:  see below;  pt performed the following exercises 10 reps each  Access Code: [email or web address] URL: [email or web address] Date: 03/10/2022 Prepared by: Ethelene Browns  Exercises - Supine Bridge  - 1 x daily - 7 x weekly - 1 sets - 10 reps - 3-5 hold - Supine Bridge with Leg Extension  - 1 x daily - 7 x weekly - 1 sets - 10 reps - Marching Bridge  - 1 x daily - 7 x weekly - 1 sets - 10 reps - Bridge with Hip Abduction and Resistance - Ground Touches  - 1 x daily - 7 x weekly - 1 sets - 10 reps - Sidelying Hip Abduction  - 1 x daily - 7 x weekly - 1 sets - 10 reps - 2-3 sec hold - Clamshell with Resistance  - 1 x daily - 7 x weekly - 3 sets - 10 reps  2# weight was used with sidelying hip abduction and clam shell  Pt also performed additional exercises low back and hip strengthening:  Standing Lt hip extension with 2# weight 10 reps  Step ups forward onto 6" step LLE 10 reps  Step down exercise for Lt quad eccentric strengthening 10 reps  Lateral step ups onto 6" step with UE support on rail for Lt gluteal medius strengthening 10 reps  Tap ups onto 6" step without UE support for closed chain strengthening and for improved SLS balance on each leg   TUG score 12.8 secs without device - no fall risk indicated; (LTG not warranted)      PATIENT EDUCATION: Education details: Medbridge HEP [email or web address] - see above - issued on 03-09-22 Person educated: Patient and Spouse Education method: Customer service manager, handout Education comprehension: verbalized understanding   HOME EXERCISE PROGRAM: Medbridge [email or web address]  Access Code: [email or web address] URL: [email or web address] Date:  02/09/2022 Prepared by: Ethelene Browns  Exercises - Clamshell  - 1 x daily - 7 x weekly - 1-2 sets - 10 reps - 2-3 hold - Sidelying Hip Abduction  - 1 x daily - 7 x weekly - 1-2 sets - 10 reps - 2 hold  GOALS: Goals reviewed with patient? Yes  SHORT TERM GOALS: Target date: 03-12-22   PT Short Term Goals - 02/09/22 1945       PT SHORT TERM GOAL #1   Title Initiate aquatic therapy at [facility] pt will amb. from car to pool area with RW with supervision with c/o Lt hip pain </= 5/10 after amb. this distance.    Time 3    Period Weeks    Status Ongoing - 03-09-22   Target Date 03/12/22      PT SHORT TERM GOAL #2   Title Pt will report decreased Lt hip pain to </= 4/10 with weight bearing.    Baseline pt reports Lt hip pain 7-8/10 with weight bearing    Time 3    Period Weeks  Status Ongoing - 03-09-22   Target Date 03/12/22      PT SHORT TERM GOAL #3   Title Pt will amb. 90' without device with SBA for increased household amb.    Baseline pt using SPC    Time 3    Period Weeks    Status Met - 03-09-22   Target Date 03/12/22      PT SHORT TERM GOAL #4   Title Independent in HEP for Lt hip musc. strengthening and ITB stretching.    Time 3    Period Weeks    Status Ongoing - pt has only had 1 land session since eval due to having had cytoscopy in early July   Target Date 03/12/22             LONG TERM GOALS: Target date: 04-02-22    PT Long Term Goals - 02/09/22 1952       PT LONG TERM GOAL #1   Title Independent in aquatic HEP to be continued at facility of pt's choice upon discharge from PT.    Time 6    Period Weeks    Status New    Target Date 04/02/22      PT LONG TERM GOAL #2   Title Pt will report reduced back/hip pain to </= 2/10 with weight bearing.    Baseline 4-5/10 reported with weight bearing    Time 6    Period Weeks    Status New    Target Date 04/02/22      PT LONG TERM GOAL #3   Title Pt will perform sit to stand transfer  with bil. UE support from chair with c/o pain </2/10 in low back and Lt hip.    Baseline 4-5/10 on 02-09-22    Time 6    Period Weeks    Status New    Target Date 04/02/22      PT LONG TERM GOAL #4   Title Improve TUG score by at least 3 secs without assistive device to demo improved functional mobility.    Baseline TBA    Time 6    Period Weeks    Status New    Target Date 04/02/22      PT LONG TERM GOAL #5   Title Pt will tolerate lying supine for at least 1 hour for increased comfort with sleeping.    Baseline < 30" - 02-09-22    Time 6    Period Weeks    Status New    Target Date 04/02/22            ASSESSMENT:  CLINICAL IMPRESSION: PT session focused on establishing HEP for Lt hip extensor and abductor strengthening and also low back and core strengthening exercises.  Pt unable to perform Lt hip extension in prone due to c/o low back pain with the exercise, even with pillow placed under hips to decrease strain.  STG's are ongoing - pt is progressing but progress has been limited due to decreased attendance due to cytoscopy surgery in early July with stent implant.  Pt reports much less pain in low back since this most recent surgery performed.  Pt is now able to amb. At least 89' without use of SPC in home - STG #3 met.  Cont with POC.     OBJECTIVE IMPAIRMENTS decreased balance, difficulty walking, decreased strength, postural dysfunction, and pain.   ACTIVITY LIMITATIONS carrying, bending, standing, squatting, sleeping, stairs, and locomotion level  PARTICIPATION LIMITATIONS: meal prep,  cleaning, laundry, shopping, community activity, and yard work  PERSONAL FACTORS Past/current experiences, Time since onset of injury/illness/exacerbation, and 1-2 comorbidities: low back pain  are also affecting patient's functional outcome.   REHAB POTENTIAL: Good  CLINICAL DECISION MAKING: Evolving/moderate complexity  EVALUATION COMPLEXITY: Moderate  PLAN: PT FREQUENCY:  2x/week  PT DURATION: 6 weeks - plan for 6 land visits, 6 aquatic visits  PLANNED INTERVENTIONS: Therapeutic exercises, Therapeutic activity, Neuromuscular re-education, Balance training, Gait training, Patient/Family education, Stair training, Aquatic Therapy, Cryotherapy, and Manual therapy  PLAN FOR NEXT SESSION:  check HEP   Alda Lea, PT 03/10/2022, 10:04 AM

## 2022-03-10 ENCOUNTER — Ambulatory Visit: Payer: Medicare Other | Admitting: Physical Therapy

## 2022-03-10 ENCOUNTER — Encounter: Payer: Self-pay | Admitting: Physical Therapy

## 2022-03-10 DIAGNOSIS — M25552 Pain in left hip: Secondary | ICD-10-CM | POA: Diagnosis not present

## 2022-03-10 DIAGNOSIS — R2689 Other abnormalities of gait and mobility: Secondary | ICD-10-CM | POA: Diagnosis not present

## 2022-03-10 DIAGNOSIS — R2681 Unsteadiness on feet: Secondary | ICD-10-CM | POA: Diagnosis not present

## 2022-03-10 DIAGNOSIS — M6281 Muscle weakness (generalized): Secondary | ICD-10-CM

## 2022-03-10 NOTE — Therapy (Signed)
OUTPATIENT PHYSICAL THERAPY NEURO TREATMENT NOTE   Patient Name: Brandon [name] MRN: [id number] DOB:10/16/1945, 76 y.o., male Today's Date: 03/10/2022   PCP: Sharilyn Sites, MD REFERRING PROVIDER: Florinda Marker    PT End of Session - 03/10/22 1922     Visit Number 4    Number of Visits 13    Date for PT Re-Evaluation 04/02/22   pushed out due to scheduling   Authorization Type Medicare    Authorization Time Period 02-08-22 - 05-09-22    PT Start Time 1355    PT Stop Time 1445    PT Time Calculation (min) 50 min    Equipment Utilized During Treatment Other (comment)   pool noodle, small bar bells, aquatic cuffs, aquatic weight   Activity Tolerance Patient tolerated treatment well    Behavior During Therapy [facility] for tasks assessed/performed             Past Medical History:  Diagnosis Date   Arthritis    Coronary atherosclerosis of native coronary artery    Multivessel status post CABG March 2015   DDD (degenerative disc disease), lumbar    Essential hypertension    GERD (gastroesophageal reflux disease)    Hard of hearing both ears    Left ear is worst   Hemorrhoids    History of gout    History of kidney stones    history of myasthenia Gravis 2021   History of [facility] spotted fever    Left ureteral  & renal stone    Lumbago    Mixed hyperlipidemia    Personal history of COVID-19 08/10/2021   Paxlovid   Plantar fascial fibromatosis    Postoperative atrial fibrillation (HCC)    Skin Cancer ([place])    Use of cane as ambulatory aid    Uses roller walker    UTI (urinary tract infection) 09/16/2021   Wears glasses    Past Surgical History:  Procedure Laterality Date   AMPUTATION Right 03/27/2014   Procedure: DEBRIDEMENT AND CLOSURE RIGHT INDEX FINGER;  Surgeon: [name], MD;  Location: [place];  Service: Orthopedics;  Laterality: Right;   BACK SURGERY     lower not sure when per wife on 10-01-2021   BICEPT TENODESIS  11/26/2011   Procedure:  BICEPT TENODESIS;  Surgeon: Augustin Schooling, MD;  Location: [place];  Service: Orthopedics;  Laterality: Left;   BIOPSY  08/03/2018   Procedure: BIOPSY;  Surgeon: Rogene Houston, MD;  [facility];  Service: Endoscopy;;  antral   Cataract surgery Bilateral    yrs ago per wife on 10-01-2021   CHOLECYSTECTOMY N/A 09/22/2018   Procedure: LAPAROSCOPIC CHOLECYSTECTOMY;  Surgeon: Virl Cagey, MD;  [facility]: [facility];  Service: General;  Laterality: N/A;   COLONOSCOPY N/A 12/26/2014   Procedure: COLONOSCOPY;  Surgeon: Rogene Houston, MD;  [facility];  Service: Endoscopy;  Laterality: N/A;  730   CORONARY ARTERY BYPASS GRAFT N/A 10/19/2013   Procedure: CORONARY ARTERY BYPASS GRAFTING (CABG);  Surgeon: Rexene Alberts, MD;  Location: [place];  Service: Open Heart Surgery;  Laterality: N/A;  CABG times four using left internal mammary artery and left leg saphenous vein, incision made on right leg but no vein removed   CYSTOSCOPY W/ URETERAL STENT PLACEMENT Right 02/22/2022   Procedure: CYSTOSCOPY WITH RETROGRADE PYELOGRAM/URETERAL STENT PLACEMENT;  Surgeon: Ceasar Mons, MD;  [facility];  Service: Urology;  Laterality: Right;   CYSTOSCOPY WITH RETROGRADE PYELOGRAM,  URETEROSCOPY AND STENT PLACEMENT Left 08/24/2016   Procedure: CYSTOSCOPY WITH LEFT RETROGRADE PYELOGRAM, LEFT URETEROSCOPY, BASKET EXTRACTION LEFT URETERAL STONE;  Surgeon: Irine Seal, MD;  [facility];  Service: Urology;  Laterality: Left;   CYSTOSCOPY/URETEROSCOPY/HOLMIUM LASER/STENT PLACEMENT Left 10/06/2021   Procedure: CYSTOSCOPY LEFT RETROGRADE PYELOGRAM URETEROSCOPY/HOLMIUM LASER/STENT PLACEMENT;  Surgeon: Irine Seal, MD;  Location[facility];  Service: Urology;  Laterality: Left;   CYSTOSCOPY/URETEROSCOPY/HOLMIUM LASER/STENT PLACEMENT Right 03/01/2022   Procedure: CYSTOSCOPY RIGHT URETEROSCOPY/HOLMIUM LASER/STENT PLACEMENT;  Surgeon: Irine Seal, MD;  [facility];   Service: Urology;  Laterality: Right;   ESOPHAGOGASTRODUODENOSCOPY N/A 08/03/2018   Procedure: ESOPHAGOGASTRODUODENOSCOPY (EGD);  Surgeon: Rogene Houston, MD;  [facility];  Service: Endoscopy;  Laterality: N/A;  2:55   EXTRACORPOREAL SHOCK WAVE LITHOTRIPSY Right 06/18/2019   Procedure: EXTRACORPOREAL SHOCK WAVE LITHOTRIPSY (ESWL);  Surgeon: Lucas Mallow, MD;  [facility];  Service: Urology;  Laterality: Right;   INTRAOPERATIVE TRANSESOPHAGEAL ECHOCARDIOGRAM N/A 10/19/2013   Procedure: INTRAOPERATIVE TRANSESOPHAGEAL ECHOCARDIOGRAM;  Surgeon: Rexene Alberts, MD;  Location: [place];  Service: Open Heart Surgery;  Laterality: N/A;   KYPHOPLASTY N/A 03/27/2021   Procedure: Lumbar One and Lumbar Two Kyphoplasty;  Surgeon: Erline Levine, MD;  Location: [place];  Service: Neurosurgery;  Laterality: N/A;   left hand carpal tunnel release  04/04/2015   LEFT HEART CATHETERIZATION WITH CORONARY ANGIOGRAM N/A 10/09/2013   Procedure: LEFT HEART CATHETERIZATION WITH CORONARY ANGIOGRAM;  Surgeon: Burnell Blanks, MD;  Location: [facility] CATH [facility];  Service: Cardiovascular;  Laterality: N/A;   Left shoulder rotator cuff repair  12/04/2011   MOHS procedure  01/06/2021   dr Harl Bowie on left hand   ORIF PERIPROSTHETIC FRACTURE Left 03/19/2020   Procedure: OPEN REDUCTION INTERNAL FIXATION (ORIF) PERIPROSTHETIC FRACTURE LEFT FEMUR WITH FEMORAL REVISION;  Surgeon: Gaynelle Arabian, MD;  [facility];  Service: Orthopedics;  Laterality: Left;   TOTAL HIP ARTHROPLASTY Left 01/28/2020   Procedure: TOTAL HIP ARTHROPLASTY ANTERIOR APPROACH;  Surgeon: Gaynelle Arabian, MD;  [facility];  Service: Orthopedics;  Laterality: Left;  180mn   TOTAL KNEE ARTHROPLASTY Left 04/18/2001   traumaticvamputation of finger  2015   VASECTOMY     yrs ago per wife on 10-01-2021   Patient Active Problem List   Diagnosis Date Noted   Fracture, lumbar vertebra, compression ([place] 03/27/2021   Atrial  fibrillation with RVR ([facility]    Periprosthetic fracture around internal prosthetic left hip joint ([place] 03/19/2020   OA (osteoarthritis) of hip 01/28/2020   Closed displaced fracture of left femoral neck ([place] 01/28/2020   History of colonic polyps 11/12/2019   GERD (gastroesophageal reflux disease) 11/06/2019   Myasthenia gravis with (acute) exacerbation ([place] 11/06/2019   Acute diarrhea 09/17/2019   Heme positive stool 09/17/2019   Unspecified osteoarthritis, unspecified site    Hyperlipidemia, unspecified    Personal history of other diseases of the musculoskeletal system and connective tissue    Atherosclerotic heart disease of native coronary artery without angina pectoris    Hypertensive heart disease without heart failure    Melena    Plantar fascial fibromatosis    Pleurodynia    Calculus of gallbladder without cholecystitis without obstruction 09/14/2018   Nausea without vomiting 06/05/2018   Postoperative atrial fibrillation ([place] 11/09/2013   S/P CABG x 4 10/19/2013   Coronary atherosclerosis of native coronary artery 10/18/2013   Essential hypertension, benign 09/18/2013   Family history of hypertrophic cardiomyopathy 09/18/2013   Mixed hyperlipidemia  ONSET DATE: 12-29-21 (date of L2-3 MED surgery)  REFERRING DIAG: U27.253 (ICD-10-CM) - Presence of left artificial hip joint ; Lt trochanteric bursitis  THERAPY DIAG:  Pain in left hip  Muscle weakness (generalized)  Other abnormalities of gait and mobility  Rationale for Evaluation and Treatment Rehabilitation  SUBJECTIVE:                                                                                                                                                                                              SUBJECTIVE STATEMENT: Pt presents for aquatic therapy at [facility] - says he walked this morning and it helped to loosen up his back; has used frozen water bottle on his left hip some - reports minimal  pain at current time Pt accompanied by:  wife, [name]  PERTINENT HISTORY: Mr. Elkhatib is a 76 year old male with PMH of Myasthenia Gravis, osteoporosis, CAD who presented to [facility] on 12/11/21 for initial neurosurgical evaluation of low back pain referred by Dr. Sharilyn Sites. He has a history of an L3-S1 fusion with Dr. Glenna Fellows in 2016 (retired) as well as L1/L2 kyphoplasty on 03/27/21 with Dr. Erline Levine.  He reported good relief with lumbar fusion until the last 3 years.  He was diagnosed with myasthenia gravis in 2021 and was placed on steroids.  He feels that this contributed to to his osteoporosis.  He reported low back pain that radiated to his left buttock and groin.  Pain was sharp rated as 10 out of 10 at its worst.  Pain was worse with lying, standing or walking.  He was taking hydrocodone prescribed by his PCP which does not really help.  He reported lower extremity weakness and lower extremity aching.  He had tried ESI with minimal relief.  He has a history of a left hip replacement. He is s/p left L2-3 MED on 12/29/21 with Dr. Linus Mako. [place]. Pt also has Myasthenia Gravis - diagnosed in Jan. 2021   PAIN:  Are you having pain? Yes low back and Lt hip Low back 2/10 with weight bearing; no pain in sitting;  hip pain 1/10 with weight bearing; no pain in sitting  Sitting helps the back pain; walking helps the hip pain - trying 10'-15' 2-3 times a day Aggravating factors - weight bearing for back; pressure aggravates the hip  PRECAUTIONS: None  WEIGHT BEARING RESTRICTIONS No  FALLS: Has patient fallen in last 6 months? No  LIVING ENVIRONMENT: Lives with: lives with their family Lives in: House/apartment Stairs: Yes: External: 3 steps; on right going up Has following equipment at home: Single  point cane and Walker - 2 wheeled  PLOF: Independent  PATIENT GOALS decrease Lt hip and low back pain   OBJECTIVE:    Aquatic therapy at [facility] - pool temp. 90  degrees  Patient seen for aquatic therapy today.  Treatment took place in water 3.5-4.5 feet deep depending upon activity.  Pt entered and exited the pool via step negotiation with use of hand rails modified independently.  Pt performed water walking forwards 18' x 4 reps, backwards 18' x 4 reps and sideways 18' x 4 reps each without UE support for warm up  Pt performed runner's stretch - for hamstring/gastroc stretch - bil. LE's 30 sec hold x 1 rep each leg; also performed LT ITB stretch in standing holding onto pool wall - 30 sec hold 1 rep at beginning of session and then again at end of session for 1 rep 30 sec hold  Marching in place 10 reps without UE support   Pt performed squats x 10 reps with bil. UE support on pool edge; LLE unilateral squats 10 reps each leg  Standing hip abduction with use of 2.5# aquatic weight 10 reps bil. LE's; pt performed hip flexion, extension and marching 10 reps each leg with UE support with use of 2.5# aquatic weight; pt then performed hip abduction and flexion/extension 10 reps each leg with use of aquatic cuff for increased resistance with eccentric contraction and for improved balance with SLS on each leg  Sidestepping with squats with small bar bells for UE resistance and for increased core stabilization with exercise - 18' x 4 reps  Pt performed closed chain strengthening exercise for hip extensor strengthening - pushing yellow noodle down toward floor 2 sets 10 reps each leg; then knee flexion/extension 10 reps each leg with foot on yellow noodle for resistance  Pt requires buoyancy of water for support with standing balance and with gait training without UE support; buoyancy of water also needed for joint unloading for pain reduction with weight bearing; buoyancy also provides spinal decompression for reduced back pain in standing;  viscosity of water needed for resistance for strengthening bil. LE's:  water current provides perturbations for challenges  with balance that cannot be safely performed on land              PATIENT EDUCATION: Education details: eval results; instructed pt to ice Lt hip with frozen water bottle for massage simultaneously; use pillow under knees when lying supine to decrease strain on low back Person educated: Patient and Spouse Education method: Customer service manager Education comprehension: verbalized understanding   HOME EXERCISE PROGRAM: [facility] [email or web address]  Access Code: [email or web address] URL: [email or web address] Date: 02/09/2022 Prepared by: Ethelene Browns  Exercises - Clamshell  - 1 x daily - 7 x weekly - 1-2 sets - 10 reps - 2-3 hold - Sidelying Hip Abduction  - 1 x daily - 7 x weekly - 1-2 sets - 10 reps - 2 hold  GOALS: Goals reviewed with patient? Yes TARGET DATE: 03-12-22   PT Short Term Goals - 02/09/22 1945       PT SHORT TERM GOAL #1   Title Initiate aquatic therapy at [facility] pt will amb. from car to pool area with RW with supervision with c/o Lt hip pain </= 5/10 after amb. this distance.    Time 3    Period Weeks    Status Goal met 03-10-22; pt using SPC, not RW for assistance with amb.    Target Date 03/12/22  PT SHORT TERM GOAL #2   Title Pt will report decreased Lt hip pain to </= 4/10 with weight bearing.    Baseline pt reports Lt hip pain 7-8/10 with weight bearing    Time 3    Period Weeks    Status Ongoing - 03-09-22; pt states Lt hip pain is improving   Target Date 03/12/22      PT SHORT TERM GOAL #3   Title Pt will amb. 71' without device with SBA for increased household amb.    Baseline pt using SPC    Time 3    Period Weeks    Status Met - 03-09-22   Target Date 03/12/22      PT SHORT TERM GOAL #4   Title Independent in HEP for Lt hip musc. strengthening and ITB stretching.    Time 3    Period Weeks    Status Ongoing - pt has only had 1 land session since eval due to having had cytoscopy in early July   Target  Date 03/12/22                    LONG TERM GOALS: Target date: 04-02-22    PT Long Term Goals - 02/09/22 1952       PT LONG TERM GOAL #1   Title Independent in aquatic HEP to be continued at facility of pt's choice upon discharge from PT.    Time 6    Period Weeks    Status New    Target Date 04/02/22      PT LONG TERM GOAL #2   Title Pt will report reduced back/hip pain to </= 2/10 with weight bearing.    Baseline 4-5/10 reported with weight bearing    Time 6    Period Weeks    Status New    Target Date 04/02/22      PT LONG TERM GOAL #3   Title Pt will perform sit to stand transfer with bil. UE support from chair with c/o pain </2/10 in low back and Lt hip.    Baseline 4-5/10 on 02-09-22    Time 6    Period Weeks    Status New    Target Date 04/02/22      PT LONG TERM GOAL #4   Title Improve TUG score by at least 3 secs without assistive device to demo improved functional mobility.    Baseline TBA    Time 6    Period Weeks    Status New    Target Date 04/02/22      PT LONG TERM GOAL #5   Title Pt will tolerate lying supine for at least 1 hour for increased comfort with sleeping.    Baseline < 30" - 02-09-22    Time 6    Period Weeks    Status New    Target Date 04/02/22            ASSESSMENT:  CLINICAL IMPRESSION: Aquatic PT session focused on stretching Lt hip flexors, ITB stretch and strengthening Lt hip abductors and extensors, and water walking for back pain and Lt hip pain management.  Pt has met STG #1 as he is now able to amb. From car to pool area with use of SPC, not RW, with c/o Lt hip pain 3/10.  Pt tolerated hip strengthening exercises well with very minimal c/o back pain, I.e. 2-3/10 occurring only intermittently with some exercises and movements.  Pt stated trunk rotation exercise  with use of noodle felt good to low back muscles.  Pt able to amb. From pool area to whirlpool at end of session, approx. 21' without use of SPC with  supervision.  Cont with POC.    OBJECTIVE IMPAIRMENTS decreased balance, difficulty walking, decreased strength, postural dysfunction, and pain.   ACTIVITY LIMITATIONS carrying, bending, standing, squatting, sleeping, stairs, and locomotion level  PARTICIPATION LIMITATIONS: meal prep, cleaning, laundry, shopping, community activity, and yard work  PERSONAL FACTORS Past/current experiences, Time since onset of injury/illness/exacerbation, and 1-2 comorbidities: low back pain  are also affecting patient's functional outcome.   REHAB POTENTIAL: Good  CLINICAL DECISION MAKING: Evolving/moderate complexity  EVALUATION COMPLEXITY: Moderate  PLAN: PT FREQUENCY: 2x/week  PT DURATION: 6 weeks - plan for 6 land visits, 6 aquatic visits  PLANNED INTERVENTIONS: Therapeutic exercises, Therapeutic activity, Neuromuscular re-education, Balance training, Gait training, Patient/Family education, Stair training, Aquatic Therapy, Cryotherapy, and Manual therapy  PLAN FOR NEXT SESSION: check HEP; cont hip and low back musc. strengthening   Tanith Dagostino, [place], PT 03/10/2022, 7:28 PM

## 2022-03-11 DIAGNOSIS — M180 Bilateral primary osteoarthritis of first carpometacarpal joints: Secondary | ICD-10-CM | POA: Diagnosis not present

## 2022-03-11 DIAGNOSIS — M19021 Primary osteoarthritis, right elbow: Secondary | ICD-10-CM | POA: Diagnosis not present

## 2022-03-15 ENCOUNTER — Ambulatory Visit: Payer: Medicare Other | Admitting: Physical Therapy

## 2022-03-15 ENCOUNTER — Encounter: Payer: Self-pay | Admitting: Physical Therapy

## 2022-03-15 DIAGNOSIS — M6281 Muscle weakness (generalized): Secondary | ICD-10-CM

## 2022-03-15 DIAGNOSIS — M25552 Pain in left hip: Secondary | ICD-10-CM

## 2022-03-15 DIAGNOSIS — R2681 Unsteadiness on feet: Secondary | ICD-10-CM | POA: Diagnosis not present

## 2022-03-15 DIAGNOSIS — N201 Calculus of ureter: Secondary | ICD-10-CM | POA: Diagnosis not present

## 2022-03-15 DIAGNOSIS — N3000 Acute cystitis without hematuria: Secondary | ICD-10-CM | POA: Diagnosis not present

## 2022-03-15 DIAGNOSIS — R2689 Other abnormalities of gait and mobility: Secondary | ICD-10-CM | POA: Diagnosis not present

## 2022-03-15 NOTE — Therapy (Signed)
OUTPATIENT PHYSICAL THERAPY NEURO EVALUATION   Patient Name: Brandon [name] MRN: [id number] DOB:1946-06-08, 76 y.o., male Today's Date: 03/15/2022   PCP: Sharilyn Sites, MD REFERRING PROVIDER: Florinda Marker    PT End of Session - 03/15/22 2033     Visit Number 5    Number of Visits 13    Date for PT Re-Evaluation 04/02/22   pushed out due to scheduling   Authorization Time Period 02-08-22 - 05-09-22    PT Start Time 1435    PT Stop Time 1520    PT Time Calculation (min) 45 min    Equipment Utilized During Treatment Other (comment)   pool noodle, small bar bells, aquatic cuffs   Activity Tolerance Patient tolerated treatment well    Behavior During Therapy [facility] for tasks assessed/performed              Past Medical History:  Diagnosis Date   Arthritis    Coronary atherosclerosis of native coronary artery    Multivessel status post CABG March 2015   DDD (degenerative disc disease), lumbar    Essential hypertension    GERD (gastroesophageal reflux disease)    Hard of hearing both ears    Left ear is worst   Hemorrhoids    History of gout    History of kidney stones    history of myasthenia Gravis 2021   History of [facility] spotted fever    Left ureteral  & renal stone    Lumbago    Mixed hyperlipidemia    Personal history of COVID-19 08/10/2021   Paxlovid   Plantar fascial fibromatosis    Postoperative atrial fibrillation (HCC)    Skin Cancer ([place])    Use of cane as ambulatory aid    Uses roller walker    UTI (urinary tract infection) 09/16/2021   Wears glasses    Past Surgical History:  Procedure Laterality Date   AMPUTATION Right 03/27/2014   Procedure: DEBRIDEMENT AND CLOSURE RIGHT INDEX FINGER;  Surgeon: [name], MD;  Location: [place];  Service: Orthopedics;  Laterality: Right;   BACK SURGERY     lower not sure when per wife on 10-01-2021   BICEPT TENODESIS  11/26/2011   Procedure: BICEPT TENODESIS;  Surgeon: Augustin Schooling, MD;   Location: [place];  Service: Orthopedics;  Laterality: Left;   BIOPSY  08/03/2018   Procedure: BIOPSY;  Surgeon: Rogene Houston, MD;  [facility];  Service: Endoscopy;;  antral   Cataract surgery Bilateral    yrs ago per wife on 10-01-2021   CHOLECYSTECTOMY N/A 09/22/2018   Procedure: LAPAROSCOPIC CHOLECYSTECTOMY;  Surgeon: Virl Cagey, MD;  [facility]: [facility];  Service: General;  Laterality: N/A;   COLONOSCOPY N/A 12/26/2014   Procedure: COLONOSCOPY;  Surgeon: Rogene Houston, MD;  [facility];  Service: Endoscopy;  Laterality: N/A;  730   CORONARY ARTERY BYPASS GRAFT N/A 10/19/2013   Procedure: CORONARY ARTERY BYPASS GRAFTING (CABG);  Surgeon: Rexene Alberts, MD;  Location: [place];  Service: Open Heart Surgery;  Laterality: N/A;  CABG times four using left internal mammary artery and left leg saphenous vein, incision made on right leg but no vein removed   CYSTOSCOPY W/ URETERAL STENT PLACEMENT Right 02/22/2022   Procedure: CYSTOSCOPY WITH RETROGRADE PYELOGRAM/URETERAL STENT PLACEMENT;  Surgeon: Ceasar Mons, MD;  [facility];  Service: Urology;  Laterality: Right;   CYSTOSCOPY WITH RETROGRADE PYELOGRAM, URETEROSCOPY AND STENT PLACEMENT Left 08/24/2016  Procedure: CYSTOSCOPY WITH LEFT RETROGRADE PYELOGRAM, LEFT URETEROSCOPY, BASKET EXTRACTION LEFT URETERAL STONE;  Surgeon: Irine Seal, MD;  [facility];  Service: Urology;  Laterality: Left;   CYSTOSCOPY/URETEROSCOPY/HOLMIUM LASER/STENT PLACEMENT Left 10/06/2021   Procedure: CYSTOSCOPY LEFT RETROGRADE PYELOGRAM URETEROSCOPY/HOLMIUM LASER/STENT PLACEMENT;  Surgeon: Irine Seal, MD;  Location: [facility];  Service: Urology;  Laterality: Left;   CYSTOSCOPY/URETEROSCOPY/HOLMIUM LASER/STENT PLACEMENT Right 03/01/2022   Procedure: CYSTOSCOPY RIGHT URETEROSCOPY/HOLMIUM LASER/STENT PLACEMENT;  Surgeon: Irine Seal, MD;  [facility];  Service: Urology;  Laterality: Right;    ESOPHAGOGASTRODUODENOSCOPY N/A 08/03/2018   Procedure: ESOPHAGOGASTRODUODENOSCOPY (EGD);  Surgeon: Rogene Houston, MD;  [facility];  Service: Endoscopy;  Laterality: N/A;  2:55   EXTRACORPOREAL SHOCK WAVE LITHOTRIPSY Right 06/18/2019   Procedure: EXTRACORPOREAL SHOCK WAVE LITHOTRIPSY (ESWL);  Surgeon: Lucas Mallow, MD;  [facility];  Service: Urology;  Laterality: Right;   INTRAOPERATIVE TRANSESOPHAGEAL ECHOCARDIOGRAM N/A 10/19/2013   Procedure: INTRAOPERATIVE TRANSESOPHAGEAL ECHOCARDIOGRAM;  Surgeon: Rexene Alberts, MD;  Location: [place];  Service: Open Heart Surgery;  Laterality: N/A;   KYPHOPLASTY N/A 03/27/2021   Procedure: Lumbar One and Lumbar Two Kyphoplasty;  Surgeon: Erline Levine, MD;  Location: [place];  Service: Neurosurgery;  Laterality: N/A;   left hand carpal tunnel release  04/04/2015   LEFT HEART CATHETERIZATION WITH CORONARY ANGIOGRAM N/A 10/09/2013   Procedure: LEFT HEART CATHETERIZATION WITH CORONARY ANGIOGRAM;  Surgeon: Burnell Blanks, MD;  Location: [facility];  Service: Cardiovascular;  Laterality: N/A;   Left shoulder rotator cuff repair  12/04/2011   MOHS procedure  01/06/2021   dr Harl Bowie on left hand   ORIF PERIPROSTHETIC FRACTURE Left 03/19/2020   Procedure: OPEN REDUCTION INTERNAL FIXATION (ORIF) PERIPROSTHETIC FRACTURE LEFT FEMUR WITH FEMORAL REVISION;  Surgeon: Gaynelle Arabian, MD;  [facility];  Service: Orthopedics;  Laterality: Left;   TOTAL HIP ARTHROPLASTY Left 01/28/2020   Procedure: TOTAL HIP ARTHROPLASTY ANTERIOR APPROACH;  Surgeon: Gaynelle Arabian, MD;  [facility];  Service: Orthopedics;  Laterality: Left;  17mn   TOTAL KNEE ARTHROPLASTY Left 04/18/2001   traumaticvamputation of finger  2015   VASECTOMY     yrs ago per wife on 10-01-2021   Patient Active Problem List   Diagnosis Date Noted   Fracture, lumbar vertebra, compression ([place] 03/27/2021   Atrial fibrillation with RVR ([place]    Periprosthetic  fracture around internal prosthetic left hip joint ([place] 03/19/2020   OA (osteoarthritis) of hip 01/28/2020   Closed displaced fracture of left femoral neck ([place] 01/28/2020   History of colonic polyps 11/12/2019   GERD (gastroesophageal reflux disease) 11/06/2019   Myasthenia gravis with (acute) exacerbation ([place] 11/06/2019   Acute diarrhea 09/17/2019   Heme positive stool 09/17/2019   Unspecified osteoarthritis, unspecified site    Hyperlipidemia, unspecified    Personal history of other diseases of the musculoskeletal system and connective tissue    Atherosclerotic heart disease of native coronary artery without angina pectoris    Hypertensive heart disease without heart failure    Melena    Plantar fascial fibromatosis    Pleurodynia    Calculus of gallbladder without cholecystitis without obstruction 09/14/2018   Nausea without vomiting 06/05/2018   Postoperative atrial fibrillation ([place] 11/09/2013   S/P CABG x 4 10/19/2013   Coronary atherosclerosis of native coronary artery 10/18/2013   Essential hypertension, benign 09/18/2013   Family history of hypertrophic cardiomyopathy 09/18/2013   Mixed hyperlipidemia     ONSET DATE: 12-29-21 (date of  L2-3 MED surgery)  REFERRING DIAG: G29.528 (ICD-10-CM) - Presence of left artificial hip joint ; Lt trochanteric bursitis  THERAPY DIAG:  Muscle weakness (generalized)  Pain in left hip  Other abnormalities of gait and mobility  Rationale for Evaluation and Treatment Rehabilitation  SUBJECTIVE:                                                                                                                                                                                              SUBJECTIVE STATEMENT: Pt reports no changes or problems - states his left hip is feeling better as he continues to use the ice for massage and is doing exercises at home  Pt accompanied by:  wife, [name]  PERTINENT HISTORY: Mr. Gaspard is a 76 year old  male with PMH of Myasthenia Gravis, osteoporosis, CAD who presented to [facility] on 12/11/21 for initial neurosurgical evaluation of low back pain referred by Dr. Sharilyn Sites. He has a history of an L3-S1 fusion with Dr. Glenna Fellows in 2016 (retired) as well as L1/L2 kyphoplasty on 03/27/21 with Dr. Erline Levine.  He reported good relief with lumbar fusion until the last 3 years.  He was diagnosed with myasthenia gravis in 2021 and was placed on steroids.  He feels that this contributed to to his osteoporosis.  He reported low back pain that radiated to his left buttock and groin.  Pain was sharp rated as 10 out of 10 at its worst.  Pain was worse with lying, standing or walking.  He was taking hydrocodone prescribed by his PCP which does not really help.  He reported lower extremity weakness and lower extremity aching.  He had tried ESI with minimal relief.  He has a history of a left hip replacement. He is s/p left L2-3 MED on 12/29/21 with Dr. Linus Mako. [place]. Pt also has Myasthenia Gravis - diagnosed in Jan. 2021   PAIN:  Are you having pain? Yes low back and Lt hip Low back 3/10 with weight bearing; no pain in sitting;  hip pain 1/10 with weight bearing; no pain in sitting  Sitting helps the back pain; walking helps the hip pain - trying to walk 10'-15' 2-3 times a day Aggravating factors - weight bearing for back; pressure aggravates the hip  PRECAUTIONS: None  WEIGHT BEARING RESTRICTIONS No  FALLS: Has patient fallen in last 6 months? No  LIVING ENVIRONMENT: Lives with: lives with their family Lives in: House/apartment Stairs: Yes: External: 3 steps; on right going up Has following equipment at home: Single point cane and Walker - 2 wheeled  PLOF: Independent  PATIENT GOALS decrease Lt hip and low back pain   OBJECTIVE: 03-15-22  Aquatic therapy at [facility] - pool temp. 90 degrees  Patient seen for aquatic therapy today.  Treatment took place in water 3.5-4.8 feet deep  depending upon activity.  Pt entered and exited the pool via step negotiation with use of hand rails modified independently.  Pt performed water walking forwards 18' x 4 reps, backwards 18' x 4 reps and sideways 18' x 4 reps each without UE support for warm up  Pt performed runner's stretch - for hamstring/gastroc stretch - bil. LE's 30 sec hold x 1 rep each leg; also performed LT ITB stretch in standing holding onto pool wall - 30 sec hold 1 rep at beginning of session  Marching in place 10 reps without UE support   Pt performed squats x 10 reps with bil. UE support on pool edge  Standing hip abduction with use of aquatic cuff 10 reps bil. LE's; pt performed hip flexion, extension and marching 10 reps each leg with UE support with use of aquatic cuff; pt then performed hip abduction and flexion/extension 10 reps each leg with use of aquatic cuff for increased resistance with eccentric contraction and for improved balance with SLS on each leg  Sidestepping with squats with small bar bells for UE resistance and for increased core stabilization with exercise - 18' x 4 reps  Trunk rotation with wide BOS with use of yellow noodle 10 reps to each side for increased trunk and lumbar ROM    Pt performed closed chain strengthening exercise for hip extensor strengthening - pushing yellow noodle down toward floor 2 sets 10 reps each leg; then knee flexion/extension 10 reps each leg with foot on yellow noodle for resistance  Pt requires buoyancy of water for support with standing balance and with gait training without UE support; buoyancy of water also needed for joint unloading for pain reduction with weight bearing; buoyancy also provides spinal decompression for reduced back pain in standing;  viscosity of water needed for resistance for strengthening bil. LE's:  water current provides perturbations for challenges with balance that cannot be safely performed on land          PATIENT  EDUCATION: Education details: [facility] HEP [email or web address] - see above - issued on 03-09-22 Person educated: Patient and Spouse Education method: Customer service manager, handout Education comprehension: verbalized understanding   HOME EXERCISE PROGRAM: [facility] [email or web address]  Access Code: [email or web address] URL: [email or web address] Date: 02/09/2022 Prepared by: Ethelene Browns  Exercises - Clamshell  - 1 x daily - 7 x weekly - 1-2 sets - 10 reps - 2-3 hold - Sidelying Hip Abduction  - 1 x daily - 7 x weekly - 1-2 sets - 10 reps - 2 hold  GOALS: Goals reviewed with patient? Yes  SHORT TERM GOALS: Target date: 03-12-22   PT Short Term Goals - 02/09/22 1945       PT SHORT TERM GOAL #1   Title Initiate aquatic therapy at [facility] pt will amb. from car to pool area with RW with supervision with c/o Lt hip pain </= 5/10 after amb. this distance.    Time 3    Period Weeks    Status Ongoing - 03-09-22   Target Date 03/12/22      PT SHORT TERM GOAL #2   Title Pt will report decreased Lt hip pain to </= 4/10 with weight bearing.    Baseline pt reports Lt hip pain 7-8/10 with weight  bearing    Time 3    Period Weeks    Status Ongoing - 03-09-22   Target Date 03/12/22      PT SHORT TERM GOAL #3   Title Pt will amb. 79' without device with SBA for increased household amb.    Baseline pt using SPC    Time 3    Period Weeks    Status Met - 03-09-22   Target Date 03/12/22      PT SHORT TERM GOAL #4   Title Independent in HEP for Lt hip musc. strengthening and ITB stretching.    Time 3    Period Weeks    Status Ongoing - pt has only had 1 land session since eval due to having had cytoscopy in early July   Target Date 03/12/22             LONG TERM GOALS: Target date: 04-02-22    PT Long Term Goals - 02/09/22 1952       PT LONG TERM GOAL #1   Title Independent in aquatic HEP to be continued at facility of pt's choice upon discharge from PT.    Time 6     Period Weeks    Status New    Target Date 04/02/22      PT LONG TERM GOAL #2   Title Pt will report reduced back/hip pain to </= 2/10 with weight bearing.    Baseline 4-5/10 reported with weight bearing    Time 6    Period Weeks    Status New    Target Date 04/02/22      PT LONG TERM GOAL #3   Title Pt will perform sit to stand transfer with bil. UE support from chair with c/o pain </2/10 in low back and Lt hip.    Baseline 4-5/10 on 02-09-22    Time 6    Period Weeks    Status New    Target Date 04/02/22      PT LONG TERM GOAL #4   Title Improve TUG score by at least 3 secs without assistive device to demo improved functional mobility.    Baseline TBA    Time 6    Period Weeks    Status New    Target Date 04/02/22      PT LONG TERM GOAL #5   Title Pt will tolerate lying supine for at least 1 hour for increased comfort with sleeping.    Baseline < 30" - 02-09-22    Time 6    Period Weeks    Status New    Target Date 04/02/22            ASSESSMENT:  CLINICAL IMPRESSION: Aquatic PT session focused on water walking in various directions, Lt hip abductor and extensor strengthening and lumbar stabilization exercises.  Pt reported low back felt better and less tight after performing UE exercises with yellow bar bells (shoulder horizontal abduction/adduction) using bouyancy of water for resistance.  Pt reported no Lt hip pain in today's aquatic PT session.  Cont with POC.       OBJECTIVE IMPAIRMENTS decreased balance, difficulty walking, decreased strength, postural dysfunction, and pain.   ACTIVITY LIMITATIONS carrying, bending, standing, squatting, sleeping, stairs, and locomotion level  PARTICIPATION LIMITATIONS: meal prep, cleaning, laundry, shopping, community activity, and yard work  PERSONAL FACTORS Past/current experiences, Time since onset of injury/illness/exacerbation, and 1-2 comorbidities: low back pain  are also affecting patient's functional outcome.    REHAB POTENTIAL:  Good  CLINICAL DECISION MAKING: Evolving/moderate complexity  EVALUATION COMPLEXITY: Moderate  PLAN: PT FREQUENCY: 2x/week  PT DURATION: 6 weeks - plan for 6 land visits, 6 aquatic visits  PLANNED INTERVENTIONS: Therapeutic exercises, Therapeutic activity, Neuromuscular re-education, Balance training, Gait training, Patient/Family education, Stair training, Aquatic Therapy, Cryotherapy, and Manual therapy  PLAN FOR NEXT SESSION:  check HEP   Alda Lea, PT 03/15/2022, 8:36 PM

## 2022-03-16 ENCOUNTER — Ambulatory Visit: Payer: Medicare Other | Attending: Family Medicine | Admitting: Physical Therapy

## 2022-03-16 ENCOUNTER — Encounter: Payer: Self-pay | Admitting: Physical Therapy

## 2022-03-16 DIAGNOSIS — M25552 Pain in left hip: Secondary | ICD-10-CM | POA: Diagnosis not present

## 2022-03-16 DIAGNOSIS — M6281 Muscle weakness (generalized): Secondary | ICD-10-CM | POA: Insufficient documentation

## 2022-03-16 DIAGNOSIS — R2681 Unsteadiness on feet: Secondary | ICD-10-CM | POA: Insufficient documentation

## 2022-03-16 DIAGNOSIS — R2689 Other abnormalities of gait and mobility: Secondary | ICD-10-CM | POA: Diagnosis not present

## 2022-03-16 NOTE — Therapy (Signed)
OUTPATIENT PHYSICAL THERAPY NEURO TREATMENT NOTE   Patient Name: Brandon [name] MRN: [id number] DOB:[birth year], 76 y.o., male Today's Date: 03/16/2022   PCP: Sharilyn Sites, MD REFERRING PROVIDER: Florinda Marker    PT End of Session - 03/16/22 1901     Visit Number 6    Number of Visits 13    Date for PT Re-Evaluation 04/02/22   pushed out due to scheduling   Authorization Type Medicare    Authorization Time Period 02-08-22 - 05-09-22    PT Start Time 0930    PT Stop Time 1014    PT Time Calculation (min) 44 min    Equipment Utilized During Treatment --   pool noodle, small bar bells, aquatic cuffs   Activity Tolerance Patient tolerated treatment well    Behavior During Therapy [facility] for tasks assessed/performed               Past Medical History:  Diagnosis Date   Arthritis    Coronary atherosclerosis of native coronary artery    Multivessel status post CABG March 2015   DDD (degenerative disc disease), lumbar    Essential hypertension    GERD (gastroesophageal reflux disease)    Hard of hearing both ears    Left ear is worst   Hemorrhoids    History of gout    History of kidney stones    history of myasthenia Gravis 2021   History of [facility] spotted fever    Left ureteral  & renal stone    Lumbago    Mixed hyperlipidemia    Personal history of COVID-19 08/10/2021   Paxlovid   Plantar fascial fibromatosis    Postoperative atrial fibrillation (HCC)    Skin Cancer ([place])    Use of cane as ambulatory aid    Uses roller walker    UTI (urinary tract infection) 09/16/2021   Wears glasses    Past Surgical History:  Procedure Laterality Date   AMPUTATION Right 03/27/2014   Procedure: DEBRIDEMENT AND CLOSURE RIGHT INDEX FINGER;  Surgeon: [name], MD;  Location: [place];  Service: Orthopedics;  Laterality: Right;   BACK SURGERY     lower not sure when per wife on 10-01-2021   BICEPT TENODESIS  11/26/2011   Procedure: BICEPT TENODESIS;  Surgeon:  Augustin Schooling, MD;  Location: [place];  Service: Orthopedics;  Laterality: Left;   BIOPSY  08/03/2018   Procedure: BIOPSY;  Surgeon: Rogene Houston, MD;  [facility];  Service: Endoscopy;;  antral   Cataract surgery Bilateral    yrs ago per wife on 10-01-2021   CHOLECYSTECTOMY N/A 09/22/2018   Procedure: LAPAROSCOPIC CHOLECYSTECTOMY;  Surgeon: Virl Cagey, MD;  [facility]: [facility];  Service: General;  Laterality: N/A;   COLONOSCOPY N/A 12/26/2014   Procedure: COLONOSCOPY;  Surgeon: Rogene Houston, MD;  [facility];  Service: Endoscopy;  Laterality: N/A;  730   CORONARY ARTERY BYPASS GRAFT N/A 10/19/2013   Procedure: CORONARY ARTERY BYPASS GRAFTING (CABG);  Surgeon: Rexene Alberts, MD;  Location: [place];  Service: Open Heart Surgery;  Laterality: N/A;  CABG times four using left internal mammary artery and left leg saphenous vein, incision made on right leg but no vein removed   CYSTOSCOPY W/ URETERAL STENT PLACEMENT Right 02/22/2022   Procedure: CYSTOSCOPY WITH RETROGRADE PYELOGRAM/URETERAL STENT PLACEMENT;  Surgeon: Ceasar Mons, MD;  [facility];  Service: Urology;  Laterality: Right;   CYSTOSCOPY WITH RETROGRADE PYELOGRAM, URETEROSCOPY  AND STENT PLACEMENT Left 08/24/2016   Procedure: CYSTOSCOPY WITH LEFT RETROGRADE PYELOGRAM, LEFT URETEROSCOPY, BASKET EXTRACTION LEFT URETERAL STONE;  Surgeon: Irine Seal, MD;  [facility];  Service: Urology;  Laterality: Left;   CYSTOSCOPY/URETEROSCOPY/HOLMIUM LASER/STENT PLACEMENT Left 10/06/2021   Procedure: CYSTOSCOPY LEFT RETROGRADE PYELOGRAM URETEROSCOPY/HOLMIUM LASER/STENT PLACEMENT;  Surgeon: Irine Seal, MD;  Location[facility];  Service: Urology;  Laterality: Left;   CYSTOSCOPY/URETEROSCOPY/HOLMIUM LASER/STENT PLACEMENT Right 03/01/2022   Procedure: CYSTOSCOPY RIGHT URETEROSCOPY/HOLMIUM LASER/STENT PLACEMENT;  Surgeon: Irine Seal, MD;  [facility];  Service: Urology;  Laterality:  Right;   ESOPHAGOGASTRODUODENOSCOPY N/A 08/03/2018   Procedure: ESOPHAGOGASTRODUODENOSCOPY (EGD);  Surgeon: Rogene Houston, MD;  [facility];  Service: Endoscopy;  Laterality: N/A;  2:55   EXTRACORPOREAL SHOCK WAVE LITHOTRIPSY Right 06/18/2019   Procedure: EXTRACORPOREAL SHOCK WAVE LITHOTRIPSY (ESWL);  Surgeon: Lucas Mallow, MD;  [facility];  Service: Urology;  Laterality: Right;   INTRAOPERATIVE TRANSESOPHAGEAL ECHOCARDIOGRAM N/A 10/19/2013   Procedure: INTRAOPERATIVE TRANSESOPHAGEAL ECHOCARDIOGRAM;  Surgeon: Rexene Alberts, MD;  Location: [place];  Service: Open Heart Surgery;  Laterality: N/A;   KYPHOPLASTY N/A 03/27/2021   Procedure: Lumbar One and Lumbar Two Kyphoplasty;  Surgeon: Erline Levine, MD;  Location: [place];  Service: Neurosurgery;  Laterality: N/A;   left hand carpal tunnel release  04/04/2015   LEFT HEART CATHETERIZATION WITH CORONARY ANGIOGRAM N/A 10/09/2013   Procedure: LEFT HEART CATHETERIZATION WITH CORONARY ANGIOGRAM;  Surgeon: Burnell Blanks, MD;  Location: [facility] C[facility];  Service: Cardiovascular;  Laterality: N/A;   Left shoulder rotator cuff repair  12/04/2011   MOHS procedure  01/06/2021   dr Harl Bowie on left hand   ORIF PERIPROSTHETIC FRACTURE Left 03/19/2020   Procedure: OPEN REDUCTION INTERNAL FIXATION (ORIF) PERIPROSTHETIC FRACTURE LEFT FEMUR WITH FEMORAL REVISION;  Surgeon: Gaynelle Arabian, MD;  [facility];  Service: Orthopedics;  Laterality: Left;   TOTAL HIP ARTHROPLASTY Left 01/28/2020   Procedure: TOTAL HIP ARTHROPLASTY ANTERIOR APPROACH;  Surgeon: Gaynelle Arabian, MD;  [facility];  Service: Orthopedics;  Laterality: Left;  130mn   TOTAL KNEE ARTHROPLASTY Left 04/18/2001   traumaticvamputation of finger  2015   VASECTOMY     yrs ago per wife on 10-01-2021   Patient Active Problem List   Diagnosis Date Noted   Fracture, lumbar vertebra, compression ([place] 03/27/2021   Atrial fibrillation with RVR ([facility]     Periprosthetic fracture around internal prosthetic left hip joint ([facility] 03/19/2020   OA (osteoarthritis) of hip 01/28/2020   Closed displaced fracture of left femoral neck ([facility] [place] 01/28/2020   History of colonic polyps 11/12/2019   GERD (gastroesophageal reflux disease) 11/06/2019   Myasthenia gravis with (acute) exacerbation ([facility] 11/06/2019   Acute diarrhea 09/17/2019   Heme positive stool 09/17/2019   Unspecified osteoarthritis, unspecified site    Hyperlipidemia, unspecified    Personal history of other diseases of the musculoskeletal system and connective tissue    Atherosclerotic heart disease of native coronary artery without angina pectoris    Hypertensive heart disease without heart failure    Melena    Plantar fascial fibromatosis    Pleurodynia    Calculus of gallbladder without cholecystitis without obstruction 09/14/2018   Nausea without vomiting 06/05/2018   Postoperative atrial fibrillation ([place] 11/09/2013   S/P CABG x 4 10/19/2013   Coronary atherosclerosis of native coronary artery 10/18/2013   Essential hypertension, benign 09/18/2013   Family history of hypertrophic cardiomyopathy 09/18/2013   Mixed hyperlipidemia  ONSET DATE: 12-29-21 (date of L2-3 MED surgery)  REFERRING DIAG: U76.546 (ICD-10-CM) - Presence of left artificial hip joint ; Lt trochanteric bursitis  THERAPY DIAG:  Muscle weakness (generalized)  Pain in left hip  Other abnormalities of gait and mobility  Rationale for Evaluation and Treatment Rehabilitation  SUBJECTIVE:                                                                                                                                                                                              SUBJECTIVE STATEMENT: Pt reports he is doing better - states some days his back doesn't hurt much at all and other days it does; reports he sees Dr. Wynelle Link on Friday this week about his hip but states his Lt hip pain is much less than it  was at eval; states he is still doing the exercises at home Pt accompanied by:  wife, [name]  PERTINENT HISTORY: Mr. Morgan is a 76 year old male with PMH of Myasthenia Gravis, osteoporosis, CAD who presented to [facility] on 12/11/21 for initial neurosurgical evaluation of low back pain referred by Dr. Sharilyn Sites. He has a history of an L3-S1 fusion with Dr. Glenna Fellows in 2016 (retired) as well as L1/L2 kyphoplasty on 03/27/21 with Dr. Erline Levine.  He reported good relief with lumbar fusion until the last 3 years.  He was diagnosed with myasthenia gravis in 2021 and was placed on steroids.  He feels that this contributed to to his osteoporosis.  He reported low back pain that radiated to his left buttock and groin.  Pain was sharp rated as 10 out of 10 at its worst.  Pain was worse with lying, standing or walking.  He was taking hydrocodone prescribed by his PCP which does not really help.  He reported lower extremity weakness and lower extremity aching.  He had tried ESI with minimal relief.  He has a history of a left hip replacement. He is s/p left L2-3 MED on 12/29/21 with Dr. Linus Mako. [place]. Pt also has Myasthenia Gravis - diagnosed in Jan. 2021   PAIN:  Are you having pain? Yes low back and Lt hip Low back 3/10 with weight bearing; no pain in sitting;  hip pain 1-2/10 with weight bearing; no pain in sitting  Sitting helps the back pain; walking helps the hip pain - trying to walk 10'-15' 2-3 times a day Aggravating factors - weight bearing for back; pressure aggravates the hip  PRECAUTIONS: None  WEIGHT BEARING RESTRICTIONS No  FALLS: Has patient fallen in last 6 months? No  LIVING ENVIRONMENT: Lives with: lives  with their family Lives in: House/apartment Stairs: Yes: External: 3 steps; on right going up Has following equipment at home: Single point cane and Walker - 2 wheeled  PLOF: Independent  PATIENT GOALS decrease Lt hip and low back pain   OBJECTIVE:   03-16-22:    TherEx:   Exercises: - Supine Bridge  - 5 reps with 5 sec hold - Supine Bridge with Leg Extension - 5 reps; then progressed to bridging with legs on red ball - bridge with 1 leg extension -5 reps each leg - Marching Bridge  10 reps - Bridge with Hip Abduction and adduction 10 reps  - Sidelying Hip Abduction  - LLE - 3# 5 reps with mod. Tactile cues for correct positioning to keep leg in line with trunk;  decreased weight to 2# - performed 5 additional reps - Clamshell LLE with 3# weight 10 reps  SLR LLE; 3# weight 5 reps, 2 sets:  RLE 3# weight 10 reps - no dfficulty performing this exercise with RLE   Standing Lt hip extension with 3# weight 10 reps  Step ups forward onto 6" step LLE 10 reps; RLE 10 reps onto 6" step  Lateral step ups onto 6" step with UE support on rail for Lt gluteal medius strengthening 10 reps  Trunk rotation with legs on red ball - 2 reps 20 sec hold to each side  Hip and knee flexion/extension with legs on red ball 10 reps  Tall kneeling - mini squats 10 reps - sitting back toward heels  Lt 1/2 kneeling for Lt hip flexor stretching with UE support on red physioball; pt lifted each UE to 120 degree shoulder flexon, then lifted both arms together for increased trunk musc. stretching        PATIENT EDUCATION:   NO changes made to HEP on 03-16-22  Education details: [employer] HEP [email or web address] - see above - issued on 03-09-22 Person educated: Patient and Spouse Education method: Customer service manager, handout Education comprehension: verbalized understanding   HOME EXERCISE PROGRAM: [employer] [email or web address]  Access Code: [email or web address] URL: [email or web address] Date: 02/09/2022 Prepared by: Ethelene Browns  Exercises - Clamshell  - 1 x daily - 7 x weekly - 1-2 sets - 10 reps - 2-3 hold - Sidelying Hip Abduction  - 1 x daily - 7 x weekly - 1-2 sets - 10 reps - 2 hold  GOALS: Goals reviewed with patient? Yes  SHORT TERM GOALS: Target  date: 03-12-22   PT Short Term Goals - 02/09/22 1945       PT SHORT TERM GOAL #1   Title Initiate aquatic therapy at [facility] pt will amb. from car to pool area with RW with supervision with c/o Lt hip pain </= 5/10 after amb. this distance.    Time 3    Period Weeks    Status Ongoing - 03-09-22   Target Date 03/12/22      PT SHORT TERM GOAL #2   Title Pt will report decreased Lt hip pain to </= 4/10 with weight bearing.    Baseline pt reports Lt hip pain 7-8/10 with weight bearing    Time 3    Period Weeks    Status Ongoing - 03-09-22   Target Date 03/12/22      PT SHORT TERM GOAL #3   Title Pt will amb. 79' without device with SBA for increased household amb.    Baseline pt using SPC    Time 3    Period Weeks  Status Met - 03-09-22   Target Date 03/12/22      PT SHORT TERM GOAL #4   Title Independent in HEP for Lt hip musc. strengthening and ITB stretching.    Time 3    Period Weeks    Status Ongoing- 03-09-22 - pt has only had 1 land session since eval due to having had cytoscopy in early July   Target Date 03/12/22             LONG TERM GOALS: Target date: 04-02-22    PT Long Term Goals - 02/09/22 1952       PT LONG TERM GOAL #1   Title Independent in aquatic HEP to be continued at facility of pt's choice upon discharge from PT.    Time 6    Period Weeks    Status New    Target Date 04/02/22      PT LONG TERM GOAL #2   Title Pt will report reduced back/hip pain to </= 2/10 with weight bearing.    Baseline 4-5/10 reported with weight bearing    Time 6    Period Weeks    Status New    Target Date 04/02/22      PT LONG TERM GOAL #3   Title Pt will perform sit to stand transfer with bil. UE support from chair with c/o pain </2/10 in low back and Lt hip.    Baseline 4-5/10 on 02-09-22    Time 6    Period Weeks    Status New    Target Date 04/02/22      PT LONG TERM GOAL #4   Title Improve TUG score by at least 3 secs without assistive  device to demo improved functional mobility.    Baseline 12.8 secs without use of device on 03-09-22   Time 6    Period Weeks    Status New    Target Date 04/02/22      PT LONG TERM GOAL #5   Title Pt will tolerate lying supine for at least 1 hour for increased comfort with sleeping.    Baseline < 30" - 02-09-22    Time 6    Period Weeks    Status New    Target Date 04/02/22            ASSESSMENT:  CLINICAL IMPRESSION: PT session focused on Lt hip musc. strengthening and stretching and low back strengthening.  Pt demonstrating increased strength with weight increased from 2# used in previous session to 3# weight used for PRE's in today's session.  Pt had moderate difficulty performing SLR with 3# weight on LLE, but able to take rest break and perform 2 sets 5 reps.  Pt using SPC for assistance with ambulation; states he uses RW when walking long distance at home to his mailbox.  Pt is progressing towards LTG's.      OBJECTIVE IMPAIRMENTS decreased balance, difficulty walking, decreased strength, postural dysfunction, and pain.   ACTIVITY LIMITATIONS carrying, bending, standing, squatting, sleeping, stairs, and locomotion level  PARTICIPATION LIMITATIONS: meal prep, cleaning, laundry, shopping, community activity, and yard work  PERSONAL FACTORS Past/current experiences, Time since onset of injury/illness/exacerbation, and 1-2 comorbidities: low back pain  are also affecting patient's functional outcome.   REHAB POTENTIAL: Good  CLINICAL DECISION MAKING: Evolving/moderate complexity  EVALUATION COMPLEXITY: Moderate  PLAN: PT FREQUENCY: 2x/week  PT DURATION: 6 weeks - plan for 6 land visits, 6 aquatic visits  PLANNED INTERVENTIONS: Therapeutic exercises, Therapeutic activity, Neuromuscular  re-education, Balance training, Gait training, Patient/Family education, Stair training, Aquatic Therapy, Cryotherapy, and Manual therapy  PLAN FOR NEXT SESSION:  try quadruped - birddog  pose; trunk stabilization exercises, Lt hip strengthening   Litzi Binning, [place], PT 03/16/2022, 7:10 PM

## 2022-03-19 DIAGNOSIS — Z96642 Presence of left artificial hip joint: Secondary | ICD-10-CM | POA: Diagnosis not present

## 2022-03-19 DIAGNOSIS — M7062 Trochanteric bursitis, left hip: Secondary | ICD-10-CM | POA: Diagnosis not present

## 2022-03-22 ENCOUNTER — Ambulatory Visit: Payer: Medicare Other | Admitting: Physical Therapy

## 2022-03-22 DIAGNOSIS — R2689 Other abnormalities of gait and mobility: Secondary | ICD-10-CM | POA: Diagnosis not present

## 2022-03-22 DIAGNOSIS — M25552 Pain in left hip: Secondary | ICD-10-CM

## 2022-03-22 DIAGNOSIS — M6281 Muscle weakness (generalized): Secondary | ICD-10-CM

## 2022-03-22 DIAGNOSIS — R2681 Unsteadiness on feet: Secondary | ICD-10-CM | POA: Diagnosis not present

## 2022-03-23 ENCOUNTER — Ambulatory Visit: Payer: Medicare Other | Admitting: Physical Therapy

## 2022-03-23 ENCOUNTER — Encounter: Payer: Self-pay | Admitting: Physical Therapy

## 2022-03-23 DIAGNOSIS — M6281 Muscle weakness (generalized): Secondary | ICD-10-CM

## 2022-03-23 DIAGNOSIS — M25552 Pain in left hip: Secondary | ICD-10-CM | POA: Diagnosis not present

## 2022-03-23 DIAGNOSIS — R2689 Other abnormalities of gait and mobility: Secondary | ICD-10-CM | POA: Diagnosis not present

## 2022-03-23 DIAGNOSIS — R2681 Unsteadiness on feet: Secondary | ICD-10-CM | POA: Diagnosis not present

## 2022-03-23 NOTE — Therapy (Signed)
OUTPATIENT PHYSICAL THERAPY TREATMENT NOTE   Patient Name: Brandon [name] MRN: [id number] DOB:11/26/1945, 76 y.o., male Today's Date: 03/23/2022   PCP: Sharilyn Sites, MD REFERRING PROVIDER: Florinda Marker    PT End of Session - 03/23/22 0905     Visit Number 7    Number of Visits 13    Date for PT Re-Evaluation 04/02/22   pushed out due to scheduling   Authorization Type Medicare    Authorization Time Period 02-08-22 - 05-09-22    PT Start Time 1305    PT Stop Time 1350    PT Time Calculation (min) 45 min    Equipment Utilized During Treatment Other (comment)   pool noodle, small bar bells, aquatic weight - 2.5#   Activity Tolerance Patient tolerated treatment well    Behavior During Therapy [facility] for tasks assessed/performed              Past Medical History:  Diagnosis Date   Arthritis    Coronary atherosclerosis of native coronary artery    Multivessel status post CABG March 2015   DDD (degenerative disc disease), lumbar    Essential hypertension    GERD (gastroesophageal reflux disease)    Hard of hearing both ears    Left ear is worst   Hemorrhoids    History of gout    History of kidney stones    history of myasthenia Gravis 2021   History of [facility] spotted fever    Left ureteral  & renal stone    Lumbago    Mixed hyperlipidemia    Personal history of COVID-19 08/10/2021   Paxlovid   Plantar fascial fibromatosis    Postoperative atrial fibrillation (HCC)    Skin Cancer ([place])    Use of cane as ambulatory aid    Uses roller walker    UTI (urinary tract infection) 09/16/2021   Wears glasses    Past Surgical History:  Procedure Laterality Date   AMPUTATION Right 03/27/2014   Procedure: DEBRIDEMENT AND CLOSURE RIGHT INDEX FINGER;  Surgeon: [name], MD;  Location: [place];  Service: Orthopedics;  Laterality: Right;   BACK SURGERY     lower not sure when per wife on 10-01-2021   BICEPT TENODESIS  11/26/2011   Procedure: BICEPT  TENODESIS;  Surgeon: Augustin Schooling, MD;  Location: [place];  Service: Orthopedics;  Laterality: Left;   BIOPSY  08/03/2018   Procedure: BIOPSY;  Surgeon: Rogene Houston, MD;  [facility];  Service: Endoscopy;;  antral   Cataract surgery Bilateral    yrs ago per wife on 10-01-2021   CHOLECYSTECTOMY N/A 09/22/2018   Procedure: LAPAROSCOPIC CHOLECYSTECTOMY;  Surgeon: Virl Cagey, MD;  [facility]: [facility];  Service: General;  Laterality: N/A;   COLONOSCOPY N/A 12/26/2014   Procedure: COLONOSCOPY;  Surgeon: Rogene Houston, MD;  [facility];  Service: Endoscopy;  Laterality: N/A;  730   CORONARY ARTERY BYPASS GRAFT N/A 10/19/2013   Procedure: CORONARY ARTERY BYPASS GRAFTING (CABG);  Surgeon: Rexene Alberts, MD;  Location: [place];  Service: Open Heart Surgery;  Laterality: N/A;  CABG times four using left internal mammary artery and left leg saphenous vein, incision made on right leg but no vein removed   CYSTOSCOPY W/ URETERAL STENT PLACEMENT Right 02/22/2022   Procedure: CYSTOSCOPY WITH RETROGRADE PYELOGRAM/URETERAL STENT PLACEMENT;  Surgeon: Ceasar Mons, MD;  [facility];  Service: Urology;  Laterality: Right;   CYSTOSCOPY WITH RETROGRADE PYELOGRAM,  URETEROSCOPY AND STENT PLACEMENT Left 08/24/2016   Procedure: CYSTOSCOPY WITH LEFT RETROGRADE PYELOGRAM, LEFT URETEROSCOPY, BASKET EXTRACTION LEFT URETERAL STONE;  Surgeon: Irine Seal, MD;  [facility];  Service: Urology;  Laterality: Left;   CYSTOSCOPY/URETEROSCOPY/HOLMIUM LASER/STENT PLACEMENT Left 10/06/2021   Procedure: CYSTOSCOPY LEFT RETROGRADE PYELOGRAM URETEROSCOPY/HOLMIUM LASER/STENT PLACEMENT;  Surgeon: Irine Seal, MD;  Location: [facility];  Service: Urology;  Laterality: Left;   CYSTOSCOPY/URETEROSCOPY/HOLMIUM LASER/STENT PLACEMENT Right 03/01/2022   Procedure: CYSTOSCOPY RIGHT URETEROSCOPY/HOLMIUM LASER/STENT PLACEMENT;  Surgeon: Irine Seal, MD;  [facility];  Service:  Urology;  Laterality: Right;   ESOPHAGOGASTRODUODENOSCOPY N/A 08/03/2018   Procedure: ESOPHAGOGASTRODUODENOSCOPY (EGD);  Surgeon: Rogene Houston, MD;  [facility];  Service: Endoscopy;  Laterality: N/A;  2:55   EXTRACORPOREAL SHOCK WAVE LITHOTRIPSY Right 06/18/2019   Procedure: EXTRACORPOREAL SHOCK WAVE LITHOTRIPSY (ESWL);  Surgeon: Lucas Mallow, MD;  [facility];  Service: Urology;  Laterality: Right;   INTRAOPERATIVE TRANSESOPHAGEAL ECHOCARDIOGRAM N/A 10/19/2013   Procedure: INTRAOPERATIVE TRANSESOPHAGEAL ECHOCARDIOGRAM;  Surgeon: Rexene Alberts, MD;  Location: [place];  Service: Open Heart Surgery;  Laterality: N/A;   KYPHOPLASTY N/A 03/27/2021   Procedure: Lumbar One and Lumbar Two Kyphoplasty;  Surgeon: Erline Levine, MD;  Location: [place];  Service: Neurosurgery;  Laterality: N/A;   left hand carpal tunnel release  04/04/2015   LEFT HEART CATHETERIZATION WITH CORONARY ANGIOGRAM N/A 10/09/2013   Procedure: LEFT HEART CATHETERIZATION WITH CORONARY ANGIOGRAM;  Surgeon: Burnell Blanks, MD;  Location[facility];  Service: Cardiovascular;  Laterality: N/A;   Left shoulder rotator cuff repair  12/04/2011   MOHS procedure  01/06/2021   dr Harl Bowie on left hand   ORIF PERIPROSTHETIC FRACTURE Left 03/19/2020   Procedure: OPEN REDUCTION INTERNAL FIXATION (ORIF) PERIPROSTHETIC FRACTURE LEFT FEMUR WITH FEMORAL REVISION;  Surgeon: Gaynelle Arabian, MD;  [facility];  Service: Orthopedics;  Laterality: Left;   TOTAL HIP ARTHROPLASTY Left 01/28/2020   Procedure: TOTAL HIP ARTHROPLASTY ANTERIOR APPROACH;  Surgeon: Gaynelle Arabian, MD;  [facility];  Service: Orthopedics;  Laterality: Left;  169mn   TOTAL KNEE ARTHROPLASTY Left 04/18/2001   traumaticvamputation of finger  2015   VASECTOMY     yrs ago per wife on 10-01-2021   Patient Active Problem List   Diagnosis Date Noted   Fracture, lumbar vertebra, compression ([place] 03/27/2021   Atrial fibrillation  with RVR ([place]    Periprosthetic fracture around internal prosthetic left hip joint ([facility] 03/19/2020   OA (osteoarthritis) of hip 01/28/2020   Closed displaced fracture of left femoral neck ([facility] 01/28/2020   History of colonic polyps 11/12/2019   GERD (gastroesophageal reflux disease) 11/06/2019   Myasthenia gravis with (acute) exacerbation ([place] 11/06/2019   Acute diarrhea 09/17/2019   Heme positive stool 09/17/2019   Unspecified osteoarthritis, unspecified site    Hyperlipidemia, unspecified    Personal history of other diseases of the musculoskeletal system and connective tissue    Atherosclerotic heart disease of native coronary artery without angina pectoris    Hypertensive heart disease without heart failure    Melena    Plantar fascial fibromatosis    Pleurodynia    Calculus of gallbladder without cholecystitis without obstruction 09/14/2018   Nausea without vomiting 06/05/2018   Postoperative atrial fibrillation ([facility] 11/09/2013   S/P CABG x 4 10/19/2013   Coronary atherosclerosis of native coronary artery 10/18/2013   Essential hypertension, benign 09/18/2013   Family history of hypertrophic cardiomyopathy 09/18/2013   Mixed hyperlipidemia  ONSET DATE: 12-29-21 (date of L2-3 MED surgery)  REFERRING DIAG: L24.401 (ICD-10-CM) - Presence of left artificial hip joint ; Lt trochanteric bursitis  THERAPY DIAG:  Muscle weakness (generalized)  Pain in left hip  Other abnormalities of gait and mobility  Unsteadiness on feet  Rationale for Evaluation and Treatment Rehabilitation  SUBJECTIVE:                                                                                                                                                                                              SUBJECTIVE STATEMENT: Pt reports he saw Dr. Wynelle Link last Friday about his Lt hip - says he said he was doing well - released him at this time and told him to just follow up as needed;  pt states  the land exercises (previous visit on 03-16-22) caused back soreness for about 2 days but then resolved   Pt accompanied by:  wife, [name]  PERTINENT HISTORY: Mr. Cashman is a 76 year old male with PMH of Myasthenia Gravis, osteoporosis, CAD who presented to [facility] on 12/11/21 for initial neurosurgical evaluation of low back pain referred by Dr. Sharilyn Sites. He has a history of an L3-S1 fusion with Dr. Glenna Fellows in 2016 (retired) as well as L1/L2 kyphoplasty on 03/27/21 with Dr. Erline Levine.  He reported good relief with lumbar fusion until the last 3 years.  He was diagnosed with myasthenia gravis in 2021 and was placed on steroids.  He feels that this contributed to to his osteoporosis.  He reported low back pain that radiated to his left buttock and groin.  Pain was sharp rated as 10 out of 10 at its worst.  Pain was worse with lying, standing or walking.  He was taking hydrocodone prescribed by his PCP which does not really help.  He reported lower extremity weakness and lower extremity aching.  He had tried ESI with minimal relief.  He has a history of a left hip replacement. He is s/p left L2-3 MED on 12/29/21 with Dr. Linus Mako. [place]. Pt also has Myasthenia Gravis - diagnosed in Jan. 2021   PAIN:  Are you having pain? Yes low back and Lt hip Low back 3/10 with weight bearing; no pain in sitting;  hip pain 1/10 with weight bearing; no pain in sitting  Sitting helps the back pain; walking helps the hip pain - trying to walk 10'-15' 2-3 times a day Aggravating factors - weight bearing for back; pressure aggravates the hip  PRECAUTIONS: None  WEIGHT BEARING RESTRICTIONS No  FALLS: Has patient fallen in last 6 months? No  LIVING  ENVIRONMENT: Lives with: lives with their family Lives in: House/apartment Stairs: Yes: External: 3 steps; on right going up Has following equipment at home: Single point cane and Walker - 2 wheeled  PLOF: Independent  PATIENT GOALS decrease Lt hip and low  back pain   OBJECTIVE: 03-15-22  Aquatic therapy at [place]. 90 degrees  Patient seen for aquatic therapy today.  Treatment took place in water 3.5-4.8 feet deep depending upon activity.  Pt entered and exited the pool via step negotiation with use of hand rails modified independently.  Pt performed water walking forwards 18' x 4 reps, backwards 18' x 2 reps and sideways 18' x 2 reps each without UE support for warm up  Pt performed runner's stretch - for hamstring/gastroc stretch - bil. LE's 30 sec hold x 1 rep each leg; also performed LT ITB stretch in standing holding onto pool wall - 30 sec hold 1 rep at end of session  Gastroc stretch bil. LE's with toes/forefeet on pool wall 30 sec hold x 1 rep   Marching in place 10 reps without UE support; 2nd set - marching in place - holding bar bell - contralateral movement of UE/LE  with 1-2 sec hold to facilitate improved SLS on each leg  Pt performed squats x 10 reps with bil. UE support on pool edge; unilateral squats each leg 10 reps with bil. UE support on pool wall  Standing hip abduction with use of 2.5# aquatic weight 10 reps bil. LE's; pt performed hip flexion, extension and marching 10 reps each leg with UE support with use of aquatic weight 2.5# each leg;  pt performed hip flexion/extension with flexed knee on each leg with use of 2.5# aquatic weight for strengthening and for improved SLS on each leg  Sidestepping with squats with small bar bells for UE resistance and for increased core stabilization with exercise - 18' x 4 reps  Trunk rotation with wide BOS with use of yellow noodle 10 reps to each side for increased trunk and lumbar ROM 10 reps to each side   Pt performed closed chain strengthening exercise for hip extensor strengthening - pushing yellow noodle down toward floor 2 sets 10 reps each leg; then knee flexion/extension 10 reps each leg with foot on yellow noodle for resistance  Pt requires buoyancy of  water for support with standing balance and with gait training without UE support; buoyancy of water also needed for joint unloading for pain reduction with weight bearing; buoyancy also provides spinal decompression for reduced back pain in standing;  viscosity of water needed for resistance for strengthening bil. LE's:  water current provides perturbations for challenges with balance that cannot be safely performed on land          PATIENT EDUCATION: Education details: [employer] HEP [email or web address] - see above - issued on 03-09-22 Person educated: Patient and Spouse Education method: Customer service manager, handout Education comprehension: verbalized understanding   HOME EXERCISE PROGRAM: [employer] [email or web address]  Access Code: [email or web address] URL: [email or web address] Date: 02/09/2022 Prepared by: Ethelene Browns  Exercises - Clamshell  - 1 x daily - 7 x weekly - 1-2 sets - 10 reps - 2-3 hold - Sidelying Hip Abduction  - 1 x daily - 7 x weekly - 1-2 sets - 10 reps - 2 hold  GOALS: Goals reviewed with patient? Yes  SHORT TERM GOALS: Target date: 03-12-22   PT Short Term Goals - 02/09/22 1945       PT SHORT TERM  GOAL #1   Title Initiate aquatic therapy at [facility] pt will amb. from car to pool area with RW with supervision with c/o Lt hip pain </= 5/10 after amb. this distance.    Time 3    Period Weeks    Status Ongoing - 03-09-22   Target Date 03/12/22      PT SHORT TERM GOAL #2   Title Pt will report decreased Lt hip pain to </= 4/10 with weight bearing.    Baseline pt reports Lt hip pain 7-8/10 with weight bearing    Time 3    Period Weeks    Status Ongoing - 03-09-22   Target Date 03/12/22      PT SHORT TERM GOAL #3   Title Pt will amb. 34' without device with SBA for increased household amb.    Baseline pt using SPC    Time 3    Period Weeks    Status Met - 03-09-22   Target Date 03/12/22      PT SHORT TERM GOAL #4   Title Independent in  HEP for Lt hip musc. strengthening and ITB stretching.    Time 3    Period Weeks    Status Ongoing - pt has only had 1 land session since eval due to having had cytoscopy in early July   Target Date 03/12/22             LONG TERM GOALS: Target date: 04-02-22    PT Long Term Goals - 02/09/22 1952       PT LONG TERM GOAL #1   Title Independent in aquatic HEP to be continued at facility of pt's choice upon discharge from PT.    Time 6    Period Weeks    Status New    Target Date 04/02/22      PT LONG TERM GOAL #2   Title Pt will report reduced back/hip pain to </= 2/10 with weight bearing.    Baseline 4-5/10 reported with weight bearing    Time 6    Period Weeks    Status New    Target Date 04/02/22      PT LONG TERM GOAL #3   Title Pt will perform sit to stand transfer with bil. UE support from chair with c/o pain </2/10 in low back and Lt hip.    Baseline 4-5/10 on 02-09-22    Time 6    Period Weeks    Status New    Target Date 04/02/22      PT LONG TERM GOAL #4   Title Improve TUG score by at least 3 secs without assistive device to demo improved functional mobility.    Baseline TBA    Time 6    Period Weeks    Status New    Target Date 04/02/22      PT LONG TERM GOAL #5   Title Pt will tolerate lying supine for at least 1 hour for increased comfort with sleeping.    Baseline < 30" - 02-09-22    Time 6    Period Weeks    Status New    Target Date 04/02/22            ASSESSMENT:  CLINICAL IMPRESSION: Aquatic PT session focused on bil. Hip strengthening, low back stretching and balance exercises to improve SLS on each leg.  Pt used 2.5# aquatic weight in today's session, compared to aquatic cuff for increased buoyancy with concentric exercise and  increased resistance with eccentric exercise.  Pt reported no pain in Lt hip with any aquatic exercises in today's session, and only minimal low back pain with aquatic exercises in today's session.  Pt is progressing  well.  Cont with POC.       OBJECTIVE IMPAIRMENTS decreased balance, difficulty walking, decreased strength, postural dysfunction, and pain.   ACTIVITY LIMITATIONS carrying, bending, standing, squatting, sleeping, stairs, and locomotion level  PARTICIPATION LIMITATIONS: meal prep, cleaning, laundry, shopping, community activity, and yard work  PERSONAL FACTORS Past/current experiences, Time since onset of injury/illness/exacerbation, and 1-2 comorbidities: low back pain  are also affecting patient's functional outcome.   REHAB POTENTIAL: Good  CLINICAL DECISION MAKING: Evolving/moderate complexity  EVALUATION COMPLEXITY: Moderate  PLAN: PT FREQUENCY: 2x/week  PT DURATION: 6 weeks - plan for 6 land visits, 6 aquatic visits  PLANNED INTERVENTIONS: Therapeutic exercises, Therapeutic activity, Neuromuscular re-education, Balance training, Gait training, Patient/Family education, Stair training, Aquatic Therapy, Cryotherapy, and Manual therapy  PLAN FOR NEXT SESSION:  check HEP   Alda Lea, PT 03/23/2022, 9:09 AM

## 2022-03-24 ENCOUNTER — Encounter: Payer: Self-pay | Admitting: Physical Therapy

## 2022-03-24 NOTE — Therapy (Signed)
OUTPATIENT PHYSICAL THERAPY NEURO TREATMENT NOTE   Patient Name: Brandon [name] MRN: [id number] DOB:14-Jul-1946, 76 y.o., male Today's Date: 03/24/2022   PCP: Sharilyn Sites, MD REFERRING PROVIDER: Florinda Marker    PT End of Session - 03/24/22 1212     Visit Number 8    Number of Visits 13    Date for PT Re-Evaluation 04/02/22   pushed out due to scheduling   Authorization Type Medicare    Authorization Time Period 02-08-22 - 05-09-22    PT Start Time 0800    PT Stop Time 0846    PT Time Calculation (min) 46 min    Equipment Utilized During Treatment --   pool noodle, small bar bells, aquatic weight - 2.5#   Activity Tolerance Patient tolerated treatment well    Behavior During Therapy [facility] for tasks assessed/performed               Past Medical History:  Diagnosis Date   Arthritis    Coronary atherosclerosis of native coronary artery    Multivessel status post CABG March 2015   DDD (degenerative disc disease), lumbar    Essential hypertension    GERD (gastroesophageal reflux disease)    Hard of hearing both ears    Left ear is worst   Hemorrhoids    History of gout    History of kidney stones    history of myasthenia Gravis 2021   History of [facility] spotted fever    Left ureteral  & renal stone    Lumbago    Mixed hyperlipidemia    Personal history of COVID-19 08/10/2021   Paxlovid   Plantar fascial fibromatosis    Postoperative atrial fibrillation (HCC)    Skin Cancer ([place])    Use of cane as ambulatory aid    Uses roller walker    UTI (urinary tract infection) 09/16/2021   Wears glasses    Past Surgical History:  Procedure Laterality Date   AMPUTATION Right 03/27/2014   Procedure: DEBRIDEMENT AND CLOSURE RIGHT INDEX FINGER;  Surgeon: [name], MD;  Location: [place];  Service: Orthopedics;  Laterality: Right;   BACK SURGERY     lower not sure when per wife on 10-01-2021   BICEPT TENODESIS  11/26/2011   Procedure: BICEPT TENODESIS;   Surgeon: Augustin Schooling, MD;  Location: [place];  Service: Orthopedics;  Laterality: Left;   BIOPSY  08/03/2018   Procedure: BIOPSY;  Surgeon: Rogene Houston, MD;  [facility];  Service: Endoscopy;;  antral   Cataract surgery Bilateral    yrs ago per wife on 10-01-2021   CHOLECYSTECTOMY N/A 09/22/2018   Procedure: LAPAROSCOPIC CHOLECYSTECTOMY;  Surgeon: Virl Cagey, MD;  [facility]: [facility];  Service: General;  Laterality: N/A;   COLONOSCOPY N/A 12/26/2014   Procedure: COLONOSCOPY;  Surgeon: Rogene Houston, MD;  [facility];  Service: Endoscopy;  Laterality: N/A;  730   CORONARY ARTERY BYPASS GRAFT N/A 10/19/2013   Procedure: CORONARY ARTERY BYPASS GRAFTING (CABG);  Surgeon: Rexene Alberts, MD;  Location: [place];  Service: Open Heart Surgery;  Laterality: N/A;  CABG times four using left internal mammary artery and left leg saphenous vein, incision made on right leg but no vein removed   CYSTOSCOPY W/ URETERAL STENT PLACEMENT Right 02/22/2022   Procedure: CYSTOSCOPY WITH RETROGRADE PYELOGRAM/URETERAL STENT PLACEMENT;  Surgeon: Ceasar Mons, MD;  [facility];  Service: Urology;  Laterality: Right;   CYSTOSCOPY WITH RETROGRADE  PYELOGRAM, URETEROSCOPY AND STENT PLACEMENT Left 08/24/2016   Procedure: CYSTOSCOPY WITH LEFT RETROGRADE PYELOGRAM, LEFT URETEROSCOPY, BASKET EXTRACTION LEFT URETERAL STONE;  Surgeon: Irine Seal, MD;  [facility];  Service: Urology;  Laterality: Left;   CYSTOSCOPY/URETEROSCOPY/HOLMIUM LASER/STENT PLACEMENT Left 10/06/2021   Procedure: CYSTOSCOPY LEFT RETROGRADE PYELOGRAM URETEROSCOPY/HOLMIUM LASER/STENT PLACEMENT;  Surgeon: Irine Seal, MD;  Location: [facility];  Service: Urology;  Laterality: Left;   CYSTOSCOPY/URETEROSCOPY/HOLMIUM LASER/STENT PLACEMENT Right 03/01/2022   Procedure: CYSTOSCOPY RIGHT URETEROSCOPY/HOLMIUM LASER/STENT PLACEMENT;  Surgeon: Irine Seal, MD;  [facility];  Service: Urology;   Laterality: Right;   ESOPHAGOGASTRODUODENOSCOPY N/A 08/03/2018   Procedure: ESOPHAGOGASTRODUODENOSCOPY (EGD);  Surgeon: Rogene Houston, MD;  [facility];  Service: Endoscopy;  Laterality: N/A;  2:55   EXTRACORPOREAL SHOCK WAVE LITHOTRIPSY Right 06/18/2019   Procedure: EXTRACORPOREAL SHOCK WAVE LITHOTRIPSY (ESWL);  Surgeon: Lucas Mallow, MD;  [facility];  Service: Urology;  Laterality: Right;   INTRAOPERATIVE TRANSESOPHAGEAL ECHOCARDIOGRAM N/A 10/19/2013   Procedure: INTRAOPERATIVE TRANSESOPHAGEAL ECHOCARDIOGRAM;  Surgeon: Rexene Alberts, MD;  Location: [place];  Service: Open Heart Surgery;  Laterality: N/A;   KYPHOPLASTY N/A 03/27/2021   Procedure: Lumbar One and Lumbar Two Kyphoplasty;  Surgeon: Erline Levine, MD;  Location: [place];  Service: Neurosurgery;  Laterality: N/A;   left hand carpal tunnel release  04/04/2015   LEFT HEART CATHETERIZATION WITH CORONARY ANGIOGRAM N/A 10/09/2013   Procedure: LEFT HEART CATHETERIZATION WITH CORONARY ANGIOGRAM;  Surgeon: Burnell Blanks, MD;  Location: [facility] C[facility];  Service: Cardiovascular;  Laterality: N/A;   Left shoulder rotator cuff repair  12/04/2011   MOHS procedure  01/06/2021   dr Harl Bowie on left hand   ORIF PERIPROSTHETIC FRACTURE Left 03/19/2020   Procedure: OPEN REDUCTION INTERNAL FIXATION (ORIF) PERIPROSTHETIC FRACTURE LEFT FEMUR WITH FEMORAL REVISION;  Surgeon: Gaynelle Arabian, MD;  [facility];  Service: Orthopedics;  Laterality: Left;   TOTAL HIP ARTHROPLASTY Left 01/28/2020   Procedure: TOTAL HIP ARTHROPLASTY ANTERIOR APPROACH;  Surgeon: Gaynelle Arabian, MD;  [facility];  Service: Orthopedics;  Laterality: Left;  163mn   TOTAL KNEE ARTHROPLASTY Left 04/18/2001   traumaticvamputation of finger  2015   VASECTOMY     yrs ago per wife on 10-01-2021   Patient Active Problem List   Diagnosis Date Noted   Fracture, lumbar vertebra, compression ([facility] 03/27/2021   Atrial fibrillation with RVR  ([place]    Periprosthetic fracture around internal prosthetic left hip joint ([facility] 03/19/2020   OA (osteoarthritis) of hip 01/28/2020   Closed displaced fracture of left femoral neck ([place] 01/28/2020   History of colonic polyps 11/12/2019   GERD (gastroesophageal reflux disease) 11/06/2019   Myasthenia gravis with (acute) exacerbation ([facility] 11/06/2019   Acute diarrhea 09/17/2019   Heme positive stool 09/17/2019   Unspecified osteoarthritis, unspecified site    Hyperlipidemia, unspecified    Personal history of other diseases of the musculoskeletal system and connective tissue    Atherosclerotic heart disease of native coronary artery without angina pectoris    Hypertensive heart disease without heart failure    Melena    Plantar fascial fibromatosis    Pleurodynia    Calculus of gallbladder without cholecystitis without obstruction 09/14/2018   Nausea without vomiting 06/05/2018   Postoperative atrial fibrillation ([facility] 11/09/2013   S/P CABG x 4 10/19/2013   Coronary atherosclerosis of native coronary artery 10/18/2013   Essential hypertension, benign 09/18/2013   Family history of hypertrophic cardiomyopathy 09/18/2013   Mixed hyperlipidemia  ONSET DATE: 12-29-21 (date of L2-3 discectomy surgery)  REFERRING DIAG: M62.947 (ICD-10-CM) - Presence of left artificial hip joint ; Lt trochanteric bursitis  THERAPY DIAG:  Muscle weakness (generalized)  Unsteadiness on feet  Rationale for Evaluation and Treatment Rehabilitation  SUBJECTIVE:                                                                                                                                                                                              SUBJECTIVE STATEMENT: Pt reports no problems or changes since aquatic therapy session yesterday; states he feels pretty good today - got up and walked early this morning as usual; reports he did have soreness in his back for 1-2 days after previous land session  last week but no Lt hip pain  Pt accompanied by:  wife, [name]  PERTINENT HISTORY: Mr. Khachatryan is a 76 year old male with PMH of Myasthenia Gravis, osteoporosis, CAD who presented to [facility] on 12/11/21 for initial neurosurgical evaluation of low back pain referred by Dr. Sharilyn Sites. He has a history of an L3-S1 fusion with Dr. Glenna Fellows in 2016 (retired) as well as L1/L2 kyphoplasty on 03/27/21 with Dr. Erline Levine.  He reported good relief with lumbar fusion until the last 3 years.  He was diagnosed with myasthenia gravis in 2021 and was placed on steroids.  He feels that this contributed to to his osteoporosis.  He reported low back pain that radiated to his left buttock and groin.  Pain was sharp rated as 10 out of 10 at its worst.  Pain was worse with lying, standing or walking.  He was taking hydrocodone prescribed by his PCP which does not really help.  He reported lower extremity weakness and lower extremity aching.  He had tried ESI with minimal relief.  He has a history of a left hip replacement. He is s/p left L2-3 MED on 12/29/21 with Dr. Linus Mako. [place]. Pt also has Myasthenia Gravis - diagnosed in Jan. 2021   PAIN:  Are you having pain? No pain reported in sitting position Pt states he will have low back pain if he stands or walks for prolonged time period - rates intensity varying from 3-5/10 depending on duration of weight bearing   PRECAUTIONS: None  WEIGHT BEARING RESTRICTIONS No  FALLS: Has patient fallen in last 6 months? No  LIVING ENVIRONMENT: Lives with: lives with their family Lives in: House/apartment Stairs: Yes: External: 3 steps; on right going up Has following equipment at home: Single point cane and Walker - 2 wheeled  PLOF: Independent  PATIENT GOALS decrease Lt hip  and low back pain   OBJECTIVE:   03-23-22:  Exercises reduced in reps and amount of weight used from previous session due to c/o increased back pain experienced after previous land  session Weight reduced from 3# to 2# and reps reduced from 10 to 5 reps for certain exercises   TherEx:   Exercises: - Supine Bridge  - 10 reps with 5 sec hold - Supine Bridge with Leg Extension - 5 reps each leg- with pt returning to initial position after extending one leg - Marching Bridge  5 reps - Bridge with Hip Abduction and adduction 5 reps  - Sidelying Hip Abduction  - bil. LE's - 2# 10 reps each leg - with mod tactile cues to maintain hips forward to minimize substitution  - Clamshell bil. LE's 2# each leg 10 reps - SLR - bil. LE's 2# weight  Sidelying - hip abducted - made circular motion 5 reps clockwise, then 5 reps counterclockwise with no resistance - RLE & LLE  Standing hip extension bil. LE's with 2# weight 10 reps  Step ups forward onto 6" step LLE 10 reps; RLE 10 reps onto 6" step  Trunk rotation with legs on red ball - 2 reps 20 sec hold to each side  SciFit level 3.0 x 5" with UE's and LE's for LE strengthening and endurance training  NeuroRe-ed:  Pt performed alternating tap ups to 1st step 5 reps each leg - without UE support; then performed alternating tap ups to 2nd step 5 reps each leg without UE support on hand rails with SBA - pt had no LOB with this activity    PATIENT EDUCATION:   NO changes made to HEP on 03-23-22  Education details: [employer] HEP [email or web address] - see above - issued on 03-09-22 Person educated: Patient and Spouse Education method: Customer service manager, handout Education comprehension: verbalized understanding   HOME EXERCISE PROGRAM: [employer] [email or web address]  Access Code: [email or web address] URL: [email or web address] Date: 02/09/2022 Prepared by: Ethelene Browns  Exercises - Clamshell  - 1 x daily - 7 x weekly - 1-2 sets - 10 reps - 2-3 hold - Sidelying Hip Abduction  - 1 x daily - 7 x weekly - 1-2 sets - 10 reps - 2 hold  GOALS: Goals reviewed with patient? Yes  SHORT TERM GOALS: Target date: 03-12-22   PT Short Term  Goals - 02/09/22 1945       PT SHORT TERM GOAL #1   Title Initiate aquatic therapy at [facility] pt will amb. from car to pool area with RW with supervision with c/o Lt hip pain </= 5/10 after amb. this distance.    Time 3    Period Weeks    Status Ongoing - 03-09-22   Target Date 03/12/22      PT SHORT TERM GOAL #2   Title Pt will report decreased Lt hip pain to </= 4/10 with weight bearing.    Baseline pt reports Lt hip pain 7-8/10 with weight bearing    Time 3    Period Weeks    Status Ongoing - 03-09-22   Target Date 03/12/22      PT SHORT TERM GOAL #3   Title Pt will amb. 33' without device with SBA for increased household amb.    Baseline pt using SPC    Time 3    Period Weeks    Status Met - 03-09-22   Target Date 03/12/22      PT SHORT TERM GOAL #4  Title Independent in HEP for Lt hip musc. strengthening and ITB stretching.    Time 3    Period Weeks    Status Ongoing- 03-09-22 - pt has only had 1 land session since eval due to having had cytoscopy in early July   Target Date 03/12/22             LONG TERM GOALS: Target date: 04-02-22    PT Long Term Goals - 02/09/22 1952       PT LONG TERM GOAL #1   Title Independent in aquatic HEP to be continued at facility of pt's choice upon discharge from PT.    Time 6    Period Weeks    Status New    Target Date 04/02/22      PT LONG TERM GOAL #2   Title Pt will report reduced back/hip pain to </= 2/10 with weight bearing.    Baseline 4-5/10 reported with weight bearing    Time 6    Period Weeks    Status New    Target Date 04/02/22      PT LONG TERM GOAL #3   Title Pt will perform sit to stand transfer with bil. UE support from chair with c/o pain </2/10 in low back and Lt hip.    Baseline 4-5/10 on 02-09-22    Time 6    Period Weeks    Status New    Target Date 04/02/22      PT LONG TERM GOAL #4   Title Improve TUG score by at least 3 secs without assistive device to demo improved  functional mobility.    Baseline 12.8 secs without use of device on 03-09-22   Time 6    Period Weeks    Status New    Target Date 04/02/22      PT LONG TERM GOAL #5   Title Pt will tolerate lying supine for at least 1 hour for increased comfort with sleeping.    Baseline < 30" - 02-09-22    Time 6    Period Weeks    Status New    Target Date 04/02/22            ASSESSMENT:  CLINICAL IMPRESSION: PT session focused on bil. Hip and low back strengthening exercises with amount of weight for PRE's reduced from 3# used in previous land session to 2# used in today's session due to c/o low back soreness after previous PT land session.  Also reduced # of reps from 10 to 5 for the bridging exercises with added component for core strengthening.  Pt tolerated exercises well today with use of 2# weight with no significant c/o low back pain reported during performance of exercises.  Pt continues to demonstrate improvements in mobility as he brings [facility] to PT session but cane was not used when ambulating in clinic gym.  Pt reports no Lt hip pain - Lt hip bursitis appears to be mostly resolved.  Cont with POC.     OBJECTIVE IMPAIRMENTS decreased balance, difficulty walking, decreased strength, postural dysfunction, and pain.   ACTIVITY LIMITATIONS carrying, bending, standing, squatting, sleeping, stairs, and locomotion level  PARTICIPATION LIMITATIONS: meal prep, cleaning, laundry, shopping, community activity, and yard work  PERSONAL FACTORS Past/current experiences, Time since onset of injury/illness/exacerbation, and 1-2 comorbidities: low back pain  are also affecting patient's functional outcome.   REHAB POTENTIAL: Good  CLINICAL DECISION MAKING: Evolving/moderate complexity  EVALUATION COMPLEXITY: Moderate  PLAN: PT FREQUENCY: 2x/week  PT  DURATION: 6 weeks - plan for 6 land visits, 6 aquatic visits  PLANNED INTERVENTIONS: Therapeutic exercises, Therapeutic activity, Neuromuscular  re-education, Balance training, Gait training, Patient/Family education, Stair training, Aquatic Therapy, Cryotherapy, and Manual therapy  PLAN FOR NEXT SESSION:  cont with LE strengthening - was pain and soreness less after this land session?   Alda Lea, PT 03/24/2022, 12:20 PM

## 2022-03-26 DIAGNOSIS — Z6826 Body mass index (BMI) 26.0-26.9, adult: Secondary | ICD-10-CM | POA: Diagnosis not present

## 2022-03-26 DIAGNOSIS — B353 Tinea pedis: Secondary | ICD-10-CM | POA: Diagnosis not present

## 2022-03-26 DIAGNOSIS — E663 Overweight: Secondary | ICD-10-CM | POA: Diagnosis not present

## 2022-03-29 ENCOUNTER — Encounter: Payer: Self-pay | Admitting: Physical Therapy

## 2022-03-29 ENCOUNTER — Ambulatory Visit: Payer: Medicare Other | Admitting: Physical Therapy

## 2022-03-29 DIAGNOSIS — R2689 Other abnormalities of gait and mobility: Secondary | ICD-10-CM | POA: Diagnosis not present

## 2022-03-29 DIAGNOSIS — R2681 Unsteadiness on feet: Secondary | ICD-10-CM | POA: Diagnosis not present

## 2022-03-29 DIAGNOSIS — M6281 Muscle weakness (generalized): Secondary | ICD-10-CM

## 2022-03-29 DIAGNOSIS — M25552 Pain in left hip: Secondary | ICD-10-CM | POA: Diagnosis not present

## 2022-03-29 NOTE — Therapy (Signed)
OUTPATIENT PHYSICAL THERAPY TREATMENT NOTE   Patient Name: Brandon [name] MRN: [id number] DOB:1946-07-25, 76 y.o., male Today's Date: 03/29/2022   PCP: Sharilyn Sites, MD REFERRING PROVIDER: Florinda Marker    PT End of Session - 03/29/22 1801     Visit Number 9    Number of Visits 13    Date for PT Re-Evaluation 04/02/22   pushed out due to scheduling   Authorization Type Medicare    Authorization Time Period 02-08-22 - 05-09-22    PT Start Time 1315    PT Stop Time 1400    PT Time Calculation (min) 45 min    Equipment Utilized During Treatment Other (comment)   pool noodle, small bar bells, aquatic weight - 2.5#   Activity Tolerance Patient tolerated treatment well    Behavior During Therapy [facility] for tasks assessed/performed              Past Medical History:  Diagnosis Date   Arthritis    Coronary atherosclerosis of native coronary artery    Multivessel status post CABG March 2015   DDD (degenerative disc disease), lumbar    Essential hypertension    GERD (gastroesophageal reflux disease)    Hard of hearing both ears    Left ear is worst   Hemorrhoids    History of gout    History of kidney stones    history of myasthenia Gravis 2021   History of [facility] spotted fever    Left ureteral  & renal stone    Lumbago    Mixed hyperlipidemia    Personal history of COVID-19 08/10/2021   Paxlovid   Plantar fascial fibromatosis    Postoperative atrial fibrillation (HCC)    Skin Cancer ([place])    Use of cane as ambulatory aid    Uses roller walker    UTI (urinary tract infection) 09/16/2021   Wears glasses    Past Surgical History:  Procedure Laterality Date   AMPUTATION Right 03/27/2014   Procedure: DEBRIDEMENT AND CLOSURE RIGHT INDEX FINGER;  Surgeon: [name], MD;  Location: [place];  Service: Orthopedics;  Laterality: Right;   BACK SURGERY     lower not sure when per wife on 10-01-2021   BICEPT TENODESIS  11/26/2011   Procedure: BICEPT  TENODESIS;  Surgeon: Augustin Schooling, MD;  Location: [place];  Service: Orthopedics;  Laterality: Left;   BIOPSY  08/03/2018   Procedure: BIOPSY;  Surgeon: Rogene Houston, MD;  [facility];  Service: Endoscopy;;  antral   Cataract surgery Bilateral    yrs ago per wife on 10-01-2021   CHOLECYSTECTOMY N/A 09/22/2018   Procedure: LAPAROSCOPIC CHOLECYSTECTOMY;  Surgeon: Virl Cagey, MD;  [facility];  Service: General;  Laterality: N/A;   COLONOSCOPY N/A 12/26/2014   Procedure: COLONOSCOPY;  Surgeon: Rogene Houston, MD;  [facility];  Service: Endoscopy;  Laterality: N/A;  730   CORONARY ARTERY BYPASS GRAFT N/A 10/19/2013   Procedure: CORONARY ARTERY BYPASS GRAFTING (CABG);  Surgeon: Rexene Alberts, MD;  Location: [place];  Service: Open Heart Surgery;  Laterality: N/A;  CABG times four using left internal mammary artery and left leg saphenous vein, incision made on right leg but no vein removed   CYSTOSCOPY W/ URETERAL STENT PLACEMENT Right 02/22/2022   Procedure: CYSTOSCOPY WITH RETROGRADE PYELOGRAM/URETERAL STENT PLACEMENT;  Surgeon: Ceasar Mons, MD;  [facility];  Service: Urology;  Laterality: Right;   CYSTOSCOPY WITH RETROGRADE PYELOGRAM,  URETEROSCOPY AND STENT PLACEMENT Left 08/24/2016   Procedure: CYSTOSCOPY WITH LEFT RETROGRADE PYELOGRAM, LEFT URETEROSCOPY, BASKET EXTRACTION LEFT URETERAL STONE;  Surgeon: Irine Seal, MD;  [facility];  Service: Urology;  Laterality: Left;   CYSTOSCOPY/URETEROSCOPY/HOLMIUM LASER/STENT PLACEMENT Left 10/06/2021   Procedure: CYSTOSCOPY LEFT RETROGRADE PYELOGRAM URETEROSCOPY/HOLMIUM LASER/STENT PLACEMENT;  Surgeon: Irine Seal, MD;  Location: [facility];  Service: Urology;  Laterality: Left;   CYSTOSCOPY/URETEROSCOPY/HOLMIUM LASER/STENT PLACEMENT Right 03/01/2022   Procedure: CYSTOSCOPY RIGHT URETEROSCOPY/HOLMIUM LASER/STENT PLACEMENT;  Surgeon: Irine Seal, MD;  [facility];  Service:  Urology;  Laterality: Right;   ESOPHAGOGASTRODUODENOSCOPY N/A 08/03/2018   Procedure: ESOPHAGOGASTRODUODENOSCOPY (EGD);  Surgeon: Rogene Houston, MD;  [facility];  Service: Endoscopy;  Laterality: N/A;  2:55   EXTRACORPOREAL SHOCK WAVE LITHOTRIPSY Right 06/18/2019   Procedure: EXTRACORPOREAL SHOCK WAVE LITHOTRIPSY (ESWL);  Surgeon: Lucas Mallow, MD;  [facility];  Service: Urology;  Laterality: Right;   INTRAOPERATIVE TRANSESOPHAGEAL ECHOCARDIOGRAM N/A 10/19/2013   Procedure: INTRAOPERATIVE TRANSESOPHAGEAL ECHOCARDIOGRAM;  Surgeon: Rexene Alberts, MD;  Location: [place];  Service: Open Heart Surgery;  Laterality: N/A;   KYPHOPLASTY N/A 03/27/2021   Procedure: Lumbar One and Lumbar Two Kyphoplasty;  Surgeon: Erline Levine, MD;  Location: [place];  Service: Neurosurgery;  Laterality: N/A;   left hand carpal tunnel release  04/04/2015   LEFT HEART CATHETERIZATION WITH CORONARY ANGIOGRAM N/A 10/09/2013   Procedure: LEFT HEART CATHETERIZATION WITH CORONARY ANGIOGRAM;  Surgeon: Burnell Blanks, MD;  [facility];  Service: Cardiovascular;  Laterality: N/A;   Left shoulder rotator cuff repair  12/04/2011   MOHS procedure  01/06/2021   dr Harl Bowie on left hand   ORIF PERIPROSTHETIC FRACTURE Left 03/19/2020   Procedure: OPEN REDUCTION INTERNAL FIXATION (ORIF) PERIPROSTHETIC FRACTURE LEFT FEMUR WITH FEMORAL REVISION;  Surgeon: Gaynelle Arabian, MD;  [facility];  Service: Orthopedics;  Laterality: Left;   TOTAL HIP ARTHROPLASTY Left 01/28/2020   Procedure: TOTAL HIP ARTHROPLASTY ANTERIOR APPROACH;  Surgeon: Gaynelle Arabian, MD;  [facility];  Service: Orthopedics;  Laterality: Left;  169mn   TOTAL KNEE ARTHROPLASTY Left 04/18/2001   traumaticvamputation of finger  2015   VASECTOMY     yrs ago per wife on 10-01-2021   Patient Active Problem List   Diagnosis Date Noted   Fracture, lumbar vertebra, compression ([place] 03/27/2021   Atrial fibrillation  with RVR ([place]    Periprosthetic fracture around internal prosthetic left hip joint ([facility] 03/19/2020   OA (osteoarthritis) of hip 01/28/2020   Closed displaced fracture of left femoral neck ([place] 01/28/2020   History of colonic polyps 11/12/2019   GERD (gastroesophageal reflux disease) 11/06/2019   Myasthenia gravis with (acute) exacerbation ([place] 11/06/2019   Acute diarrhea 09/17/2019   Heme positive stool 09/17/2019   Unspecified osteoarthritis, unspecified site    Hyperlipidemia, unspecified    Personal history of other diseases of the musculoskeletal system and connective tissue    Atherosclerotic heart disease of native coronary artery without angina pectoris    Hypertensive heart disease without heart failure    Melena    Plantar fascial fibromatosis    Pleurodynia    Calculus of gallbladder without cholecystitis without obstruction 09/14/2018   Nausea without vomiting 06/05/2018   Postoperative atrial fibrillation ([facility] 11/09/2013   S/P CABG x 4 10/19/2013   Coronary atherosclerosis of native coronary artery 10/18/2013   Essential hypertension, benign 09/18/2013   Family history of hypertrophic cardiomyopathy 09/18/2013   Mixed hyperlipidemia  ONSET DATE: 12-29-21 (date of L2-3 MED surgery)  REFERRING DIAG: X41.287 (ICD-10-CM) - Presence of left artificial hip joint ; Lt trochanteric bursitis  THERAPY DIAG:  Muscle weakness (generalized)  Unsteadiness on feet  Other abnormalities of gait and mobility  Rationale for Evaluation and Treatment Rehabilitation  SUBJECTIVE:                                                                                                                                                                                              SUBJECTIVE STATEMENT: Pt reports he is doing well - continues to have back pain which varies in intensity but states the pain is much less than it was before he had the back surgery; pt reports he is walking a lot  in his home without the cane    Pt accompanied by:  wife, [name]  PERTINENT HISTORY: Mr. Liford is a 76 year old male with PMH of Myasthenia Gravis, osteoporosis, CAD who presented to [facility] on 12/11/21 for initial neurosurgical evaluation of low back pain referred by Dr. Sharilyn Sites. He has a history of an L3-S1 fusion with Dr. Glenna Fellows in 2016 (retired) as well as L1/L2 kyphoplasty on 03/27/21 with Dr. Erline Levine.  He reported good relief with lumbar fusion until the last 3 years.  He was diagnosed with myasthenia gravis in 2021 and was placed on steroids.  He feels that this contributed to to his osteoporosis.  He reported low back pain that radiated to his left buttock and groin.  Pain was sharp rated as 10 out of 10 at its worst.  Pain was worse with lying, standing or walking.  He was taking hydrocodone prescribed by his PCP which does not really help.  He reported lower extremity weakness and lower extremity aching.  He had tried ESI with minimal relief.  He has a history of a left hip replacement. He is s/p left L2-3 MED on 12/29/21 with Dr. Linus Mako. [place]. Pt also has Myasthenia Gravis - diagnosed in Jan. 2021   PAIN:  Are you having pain? Yes low back and Lt hip Low back 2/10 with weight bearing; no pain in sitting; No Lt hip pain reported Sitting helps the back pain; walking helps the hip pain - trying to walk 10'-15' 2-3 times a day Aggravating factors - weight bearing for back; pressure aggravates the hip  PRECAUTIONS: None  WEIGHT BEARING RESTRICTIONS No  FALLS: Has patient fallen in last 6 months? No  LIVING ENVIRONMENT: Lives with: lives with their family Lives in: House/apartment Stairs: Yes: External: 3 steps; on right going up Has following  equipment at home: Single point cane and Walker - 2 wheeled  PLOF: Independent  PATIENT GOALS decrease Lt hip and low back pain   OBJECTIVE: 03-29-22  Aquatic therapy at [facility] - pool temp. 90 degrees  Patient  seen for aquatic therapy today.  Treatment took place in water 3.5-4.5 feet deep depending upon activity.  Pt entered and exited the pool via step negotiation with use of hand rails modified independently.  Pt performed water walking forwards 18' x 4 reps, backwards 18' x 4 reps and sideways 18' x 4 reps each without UE support for warm up  Pt performed runner's stretch - for hamstring/gastroc stretch - bil. LE's 30 sec hold x 1 rep each leg; also performed LT ITB stretch in standing holding onto pool wall - 30 sec hold 1 rep at end of session  Gastroc stretch bil. LE's with toes/forefeet on pool wall 30 sec hold x 1 rep   Marching in place 10 reps without UE support; 2nd set - marching in place - holding bar bell - contralateral movement of UE/LE  with 1-2 sec hold to facilitate improved SLS on each leg  Marching forward across pool with smaller barbells - contralateral UE and LE flexion with 1-2 sec hold to challenge SLS balance on each leg - 18' x 2 reps   Pt performed squats x 10 reps with bil. UE support on pool edge; unilateral squats each leg 10 reps with bil. UE support on pool wall  Standing hip abduction with use of 2.5# aquatic weight 15 reps bil. LE's; pt performed hip flexion, extension and marching 15 reps each leg with UE support with use of aquatic weight 2.5# weight on each leg  Sidestepping with squats with small bar bells for UE resistance and for increased core stabilization with exercise - 18' x 4 reps  Trunk rotation with wide BOS with use of yellow noodle 10 reps to each side for increased trunk and lumbar ROM 10 reps to each side   Pt performed closed chain strengthening exercise for hip extensor strengthening - pushing yellow noodle down toward floor 2 sets 10 reps each leg; then knee flexion/extension 10 reps each leg with foot on yellow noodle for resistance  Pt requires buoyancy of water for support with standing balance and with gait training without UE support;  buoyancy of water also needed for joint unloading for pain reduction with weight bearing; buoyancy also provides spinal decompression for reduced back pain in standing;  viscosity of water needed for resistance for strengthening bil. LE's:  water current provides perturbations for challenges with balance that cannot be safely performed on land          PATIENT EDUCATION: Education details: [facility] HEP [email or web address] - see above - issued on 03-09-22 Person educated: Patient and Spouse Education method: Customer service manager, handout Education comprehension: verbalized understanding   HOME EXERCISE PROGRAM: [facility] [email or web address]  Access Code: [email or web address] URL: [email or web address] Date: 02/09/2022 Prepared by: Ethelene Browns  Exercises - Clamshell  - 1 x daily - 7 x weekly - 1-2 sets - 10 reps - 2-3 hold - Sidelying Hip Abduction  - 1 x daily - 7 x weekly - 1-2 sets - 10 reps - 2 hold  GOALS: Goals reviewed with patient? Yes  SHORT TERM GOALS: Target date: 03-12-22   PT Short Term Goals - 02/09/22 1945       PT SHORT TERM GOAL #1   Title Initiate aquatic therapy at [facility].; pt will  amb. from car to pool area with RW with supervision with c/o Lt hip pain </= 5/10 after amb. this distance.    Time 3    Period Weeks    Status Ongoing - 03-09-22   Target Date 03/12/22      PT SHORT TERM GOAL #2   Title Pt will report decreased Lt hip pain to </= 4/10 with weight bearing.    Baseline pt reports Lt hip pain 7-8/10 with weight bearing    Time 3    Period Weeks    Status Ongoing - 03-09-22   Target Date 03/12/22      PT SHORT TERM GOAL #3   Title Pt will amb. 27' without device with SBA for increased household amb.    Baseline pt using SPC    Time 3    Period Weeks    Status Met - 03-09-22   Target Date 03/12/22      PT SHORT TERM GOAL #4   Title Independent in HEP for Lt hip musc. strengthening and ITB stretching.    Time 3    Period Weeks     Status Ongoing - pt has only had 1 land session since eval due to having had cytoscopy in early July   Target Date 03/12/22             LONG TERM GOALS: Target date: 04-02-22    PT Long Term Goals - 02/09/22 1952       PT LONG TERM GOAL #1   Title Independent in aquatic HEP to be continued at facility of pt's choice upon discharge from PT.    Time 6    Period Weeks    Status New    Target Date 04/02/22      PT LONG TERM GOAL #2   Title Pt will report reduced back/hip pain to </= 2/10 with weight bearing.    Baseline 4-5/10 reported with weight bearing    Time 6    Period Weeks    Status New    Target Date 04/02/22      PT LONG TERM GOAL #3   Title Pt will perform sit to stand transfer with bil. UE support from chair with c/o pain </2/10 in low back and Lt hip.    Baseline 4-5/10 on 02-09-22    Time 6    Period Weeks    Status New    Target Date 04/02/22      PT LONG TERM GOAL #4   Title Improve TUG score by at least 3 secs without assistive device to demo improved functional mobility.    Baseline TBA    Time 6    Period Weeks    Status New    Target Date 04/02/22      PT LONG TERM GOAL #5   Title Pt will tolerate lying supine for at least 1 hour for increased comfort with sleeping.    Baseline < 30" - 02-09-22    Time 6    Period Weeks    Status New    Target Date 04/02/22            ASSESSMENT:  CLINICAL IMPRESSION: Aquatic PT focused on water walking in various directions and bil. LE strengthening and balance exercises.  Pt able to perform contralateral UE flexion with small bar bell in each hand, marching slowly across width of pool to facilitate improved SLS on each leg.  Pt had mild LOB with balance activity of leg  lifts - forward, back and side - with pt holding bar bells- pt able to recover balance independently.   Pt is progressing well towards goals.  Cont with POC.       OBJECTIVE IMPAIRMENTS decreased balance, difficulty walking, decreased  strength, postural dysfunction, and pain.   ACTIVITY LIMITATIONS carrying, bending, standing, squatting, sleeping, stairs, and locomotion level  PARTICIPATION LIMITATIONS: meal prep, cleaning, laundry, shopping, community activity, and yard work  PERSONAL FACTORS Past/current experiences, Time since onset of injury/illness/exacerbation, and 1-2 comorbidities: low back pain  are also affecting patient's functional outcome.   REHAB POTENTIAL: Good  CLINICAL DECISION MAKING: Evolving/moderate complexity  EVALUATION COMPLEXITY: Moderate  PLAN: PT FREQUENCY: 2x/week  PT DURATION: 6 weeks - plan for 6 land visits, 6 aquatic visits  PLANNED INTERVENTIONS: Therapeutic exercises, Therapeutic activity, Neuromuscular re-education, Balance training, Gait training, Patient/Family education, Stair training, Aquatic Therapy, Cryotherapy, and Manual therapy  PLAN FOR NEXT SESSION:  do 10th visit progress note:  check HEP   Lisbet Busker, [place], PT 03/29/2022, 6:05 PM

## 2022-04-05 ENCOUNTER — Ambulatory Visit: Payer: Medicare Other | Admitting: Physical Therapy

## 2022-04-05 ENCOUNTER — Encounter: Payer: Self-pay | Admitting: Physical Therapy

## 2022-04-05 DIAGNOSIS — R2681 Unsteadiness on feet: Secondary | ICD-10-CM

## 2022-04-05 DIAGNOSIS — M6281 Muscle weakness (generalized): Secondary | ICD-10-CM | POA: Diagnosis not present

## 2022-04-05 DIAGNOSIS — R2689 Other abnormalities of gait and mobility: Secondary | ICD-10-CM | POA: Diagnosis not present

## 2022-04-05 DIAGNOSIS — M25552 Pain in left hip: Secondary | ICD-10-CM | POA: Diagnosis not present

## 2022-04-05 NOTE — Therapy (Signed)
OUTPATIENT PHYSICAL THERAPY TREATMENT NOTE/10th VISIT PROGRESS NOTE   Progress Note  Reporting Period 02-09-22 to 04-05-22  See note below for Objective Data and Assessment of Progress/Goals.      Patient Name: Brandon [name] MRN: [id number] DOB:[birth year], 76 y.o., male Today's Date: 04/05/2022   PCP: Sharilyn Sites, MD REFERRING PROVIDER: Florinda Marker    PT End of Session - 04/05/22 2004     Visit Number 10    Number of Visits 13    Date for PT Re-Evaluation 04/02/22   pushed out due to scheduling   Authorization Type Medicare    Authorization Time Period 02-08-22 - 05-09-22    PT Start Time 1310    PT Stop Time 1400    PT Time Calculation (min) 50 min    Equipment Utilized During Treatment Other (comment)   pool noodle, small bar bells, aquatic weight - 2.5#   Activity Tolerance Patient tolerated treatment well    Behavior During Therapy [facility] for tasks assessed/performed              Past Medical History:  Diagnosis Date   Arthritis    Coronary atherosclerosis of native coronary artery    Multivessel status post CABG March 2015   DDD (degenerative disc disease), lumbar    Essential hypertension    GERD (gastroesophageal reflux disease)    Hard of hearing both ears    Left ear is worst   Hemorrhoids    History of gout    History of kidney stones    history of myasthenia Gravis 2021   History of [facility] spotted fever    Left ureteral  & renal stone    Lumbago    Mixed hyperlipidemia    Personal history of COVID-19 08/10/2021   Paxlovid   Plantar fascial fibromatosis    Postoperative atrial fibrillation (HCC)    Skin Cancer ([place])    Use of cane as ambulatory aid    Uses roller walker    UTI (urinary tract infection) 09/16/2021   Wears glasses    Past Surgical History:  Procedure Laterality Date   AMPUTATION Right 03/27/2014   Procedure: DEBRIDEMENT AND CLOSURE RIGHT INDEX FINGER;  Surgeon: [name], MD;  Location: [place];   Service: Orthopedics;  Laterality: Right;   BACK SURGERY     lower not sure when per wife on 10-01-2021   BICEPT TENODESIS  11/26/2011   Procedure: BICEPT TENODESIS;  Surgeon: Augustin Schooling, MD;  Location: [place];  Service: Orthopedics;  Laterality: Left;   BIOPSY  08/03/2018   Procedure: BIOPSY;  Surgeon: Rogene Houston, MD;  [facility];  Service: Endoscopy;;  antral   Cataract surgery Bilateral    yrs ago per wife on 10-01-2021   CHOLECYSTECTOMY N/A 09/22/2018   Procedure: LAPAROSCOPIC CHOLECYSTECTOMY;  Surgeon: Virl Cagey, MD;  [facility]: [facility];  Service: General;  Laterality: N/A;   COLONOSCOPY N/A 12/26/2014   Procedure: COLONOSCOPY;  Surgeon: Rogene Houston, MD;  [facility];  Service: Endoscopy;  Laterality: N/A;  730   CORONARY ARTERY BYPASS GRAFT N/A 10/19/2013   Procedure: CORONARY ARTERY BYPASS GRAFTING (CABG);  Surgeon: Rexene Alberts, MD;  Location: [place];  Service: Open Heart Surgery;  Laterality: N/A;  CABG times four using left internal mammary artery and left leg saphenous vein, incision made on right leg but no vein removed   CYSTOSCOPY W/ URETERAL STENT PLACEMENT Right 02/22/2022   Procedure: CYSTOSCOPY  WITH RETROGRADE PYELOGRAM/URETERAL STENT PLACEMENT;  Surgeon: Ceasar Mons, MD;  [facility]: [facility];  Service: Urology;  Laterality: Right;   CYSTOSCOPY WITH RETROGRADE PYELOGRAM, URETEROSCOPY AND STENT PLACEMENT Left 08/24/2016   Procedure: CYSTOSCOPY WITH LEFT RETROGRADE PYELOGRAM, LEFT URETEROSCOPY, BASKET EXTRACTION LEFT URETERAL STONE;  Surgeon: Irine Seal, MD;  [facility];  Service: Urology;  Laterality: Left;   CYSTOSCOPY/URETEROSCOPY/HOLMIUM LASER/STENT PLACEMENT Left 10/06/2021   Procedure: CYSTOSCOPY LEFT RETROGRADE PYELOGRAM URETEROSCOPY/HOLMIUM LASER/STENT PLACEMENT;  Surgeon: Irine Seal, MD;  Location: [facility];  Service: Urology;  Laterality: Left;   CYSTOSCOPY/URETEROSCOPY/HOLMIUM LASER/STENT  PLACEMENT Right 03/01/2022   Procedure: CYSTOSCOPY RIGHT URETEROSCOPY/HOLMIUM LASER/STENT PLACEMENT;  Surgeon: Irine Seal, MD;  [facility];  Service: Urology;  Laterality: Right;   ESOPHAGOGASTRODUODENOSCOPY N/A 08/03/2018   Procedure: ESOPHAGOGASTRODUODENOSCOPY (EGD);  Surgeon: Rogene Houston, MD;  [facility];  Service: Endoscopy;  Laterality: N/A;  2:55   EXTRACORPOREAL SHOCK WAVE LITHOTRIPSY Right 06/18/2019   Procedure: EXTRACORPOREAL SHOCK WAVE LITHOTRIPSY (ESWL);  Surgeon: Lucas Mallow, MD;  [facility];  Service: Urology;  Laterality: Right;   INTRAOPERATIVE TRANSESOPHAGEAL ECHOCARDIOGRAM N/A 10/19/2013   Procedure: INTRAOPERATIVE TRANSESOPHAGEAL ECHOCARDIOGRAM;  Surgeon: Rexene Alberts, MD;  Location: [place];  Service: Open Heart Surgery;  Laterality: N/A;   KYPHOPLASTY N/A 03/27/2021   Procedure: Lumbar One and Lumbar Two Kyphoplasty;  Surgeon: Erline Levine, MD;  Location: [place];  Service: Neurosurgery;  Laterality: N/A;   left hand carpal tunnel release  04/04/2015   LEFT HEART CATHETERIZATION WITH CORONARY ANGIOGRAM N/A 10/09/2013   Procedure: LEFT HEART CATHETERIZATION WITH CORONARY ANGIOGRAM;  Surgeon: Burnell Blanks, MD;  Location: [facility] CATH [facility];  Service: Cardiovascular;  Laterality: N/A;   Left shoulder rotator cuff repair  12/04/2011   MOHS procedure  01/06/2021   dr Harl Bowie on left hand   ORIF PERIPROSTHETIC FRACTURE Left 03/19/2020   Procedure: OPEN REDUCTION INTERNAL FIXATION (ORIF) PERIPROSTHETIC FRACTURE LEFT FEMUR WITH FEMORAL REVISION;  Surgeon: Gaynelle Arabian, MD;  [facility];  Service: Orthopedics;  Laterality: Left;   TOTAL HIP ARTHROPLASTY Left 01/28/2020   Procedure: TOTAL HIP ARTHROPLASTY ANTERIOR APPROACH;  Surgeon: Gaynelle Arabian, MD;  [facility];  Service: Orthopedics;  Laterality: Left;  163mn   TOTAL KNEE ARTHROPLASTY Left 04/18/2001   traumaticvamputation of finger  2015   VASECTOMY     yrs ago per  wife on 10-01-2021   Patient Active Problem List   Diagnosis Date Noted   Fracture, lumbar vertebra, compression ([place] 03/27/2021   Atrial fibrillation with RVR ([place]    Periprosthetic fracture around internal prosthetic left hip joint ([place] 03/19/2020   OA (osteoarthritis) of hip 01/28/2020   Closed displaced fracture of left femoral neck ([place] 01/28/2020   History of colonic polyps 11/12/2019   GERD (gastroesophageal reflux disease) 11/06/2019   Myasthenia gravis with (acute) exacerbation ([place] 11/06/2019   Acute diarrhea 09/17/2019   Heme positive stool 09/17/2019   Unspecified osteoarthritis, unspecified site    Hyperlipidemia, unspecified    Personal history of other diseases of the musculoskeletal system and connective tissue    Atherosclerotic heart disease of native coronary artery without angina pectoris    Hypertensive heart disease without heart failure    Melena    Plantar fascial fibromatosis    Pleurodynia    Calculus of gallbladder without cholecystitis without obstruction 09/14/2018   Nausea without vomiting 06/05/2018   Postoperative atrial fibrillation ([place] 11/09/2013   S/P CABG x 4 10/19/2013  Coronary atherosclerosis of native coronary artery 10/18/2013   Essential hypertension, benign 09/18/2013   Family history of hypertrophic cardiomyopathy 09/18/2013   Mixed hyperlipidemia     ONSET DATE: 12-29-21 (date of L2-3 MED surgery)  REFERRING DIAG: D98.338 (ICD-10-CM) - Presence of left artificial hip joint ; Lt trochanteric bursitis  THERAPY DIAG:  Muscle weakness (generalized)  Unsteadiness on feet  Other abnormalities of gait and mobility  Rationale for Evaluation and Treatment Rehabilitation  SUBJECTIVE:                                                                                                                                                                                              SUBJECTIVE STATEMENT: Pt reports he doesn't feel that great  today but doesn't know why - states he had a great day last week a day or so after pool session with very minimal back pain, but then woke up next day with pain again   Pt accompanied by:  wife, [name]  PERTINENT HISTORY: Mr. Roots is a 76 year old male with PMH of Myasthenia Gravis, osteoporosis, CAD who presented to [facility] on 12/11/21 for initial neurosurgical evaluation of low back pain referred by Dr. Sharilyn Sites. He has a history of an L3-S1 fusion with Dr. Glenna Fellows in 2016 (retired) as well as L1/L2 kyphoplasty on 03/27/21 with Dr. Erline Levine.  He reported good relief with lumbar fusion until the last 3 years.  He was diagnosed with myasthenia gravis in 2021 and was placed on steroids.  He feels that this contributed to to his osteoporosis.  He reported low back pain that radiated to his left buttock and groin.  Pain was sharp rated as 10 out of 10 at its worst.  Pain was worse with lying, standing or walking.  He was taking hydrocodone prescribed by his PCP which does not really help.  He reported lower extremity weakness and lower extremity aching.  He had tried ESI with minimal relief.  He has a history of a left hip replacement. He is s/p left L2-3 MED on 12/29/21 with Dr. Linus Mako. [place]. Pt also has Myasthenia Gravis - diagnosed in Jan. 2021   PAIN:  Are you having pain? Yes low back and Lt hip Low back 2-3/10 with weight bearing; no pain in sitting;  No hip pain with weight bearing; no pain in sitting  Sitting helps the back pain; walking helps the hip pain - trying to walk 10'-15' 2-3 times a day Aggravating factors - weight bearing for back; pressure aggravates the hip  PRECAUTIONS: None  WEIGHT BEARING RESTRICTIONS No  FALLS: Has patient fallen in last 6 months? No  LIVING ENVIRONMENT: Lives with: lives with their family Lives in: House/apartment Stairs: Yes: External: 3 steps; on right going up Has following equipment at home: Single point cane and Walker - 2  wheeled  PLOF: Independent  PATIENT GOALS decrease Lt hip and low back pain   OBJECTIVE: 04-05-22  Aquatic therapy at [place]. 90 degrees  Patient seen for aquatic therapy today.  Treatment took place in water 3.5-4.8 feet deep depending upon activity.  Pt entered and exited the pool via step negotiation with use of hand rails modified independently.  Pt performed water walking forwards 18' x 4 reps, backwards 18' x 4 reps and sideways 18' x 4 reps each for warm up  Pt performed runner's stretch - for hamstring/gastroc stretch - bil. LE's 30 sec hold x 1 rep each leg;   Gastroc stretch bil. LE's with toes/forefeet on pool wall 30 sec hold x 1 rep   Marching in place 10 reps without UE support; 2nd set - marching in place - holding bar bell - contralateral movement of UE/LE  with 1-2 sec hold to facilitate improved SLS on each leg - 18' x 4 reps forward,18' x 2 reps backwards   Pt performed squats x 10 reps with bil. UE support on pool edge; unilateral squats each leg 10 reps with bil. UE support on pool wall  Standing hip abduction with use of 2.5# aquatic weight 10 reps bil. LE's; pt performed hip flexion, extension and marching 10 reps each leg with UE support with use of aquatic weight 2.5# each leg;  pt performed hip flexion/extension with flexed knee on each leg with use of 2.5# aquatic weight for strengthening and for improved SLS on each leg  Sidestepping with squats with small bar bells for UE resistance and for increased core stabilization with exercise - 18' x 4 reps  Trunk rotation with wide BOS with use of yellow noodle 10 reps to each side for increased trunk and lumbar ROM 10 reps to each side   Pt performed closed chain strengthening exercise for hip extensor strengthening - pushing yellow noodle down toward floor 2 sets 10 reps each leg; then knee flexion/extension 10 reps each leg with foot on yellow noodle for resistance  Pt requires buoyancy of water  for support with standing balance and with gait training without UE support; buoyancy of water also needed for joint unloading for pain reduction with weight bearing; buoyancy also provides spinal decompression for reduced back pain in standing;  viscosity of water needed for resistance for strengthening bil. LE's:  water current provides perturbations for challenges with balance that cannot be safely performed on land          PATIENT EDUCATION: Education details: [employer] HEP [email or web address] - see above - issued on 03-09-22 Person educated: Patient and Spouse Education method: Customer service manager, handout Education comprehension: verbalized understanding   HOME EXERCISE PROGRAM: [employer] [email or web address]  Access Code: [email or web address] URL: [email or web address] Date: 02/09/2022 Prepared by: Ethelene Browns  Exercises - Clamshell  - 1 x daily - 7 x weekly - 1-2 sets - 10 reps - 2-3 hold - Sidelying Hip Abduction  - 1 x daily - 7 x weekly - 1-2 sets - 10 reps - 2 hold  GOALS: Goals reviewed with patient? Yes  SHORT TERM GOALS: Target date: 03-12-22   PT Short Term Goals - 02/09/22 1945       PT SHORT TERM GOAL #  1   Title Initiate aquatic therapy at [facility] pt will amb. from car to pool area with RW with supervision with c/o Lt hip pain </= 5/10 after amb. this distance.    Time 3    Period Weeks    Status Ongoing - 03-09-22   Target Date 03/12/22      PT SHORT TERM GOAL #2   Title Pt will report decreased Lt hip pain to </= 4/10 with weight bearing.    Baseline pt reports Lt hip pain 7-8/10 with weight bearing    Time 3    Period Weeks    Status Ongoing - 03-09-22   Target Date 03/12/22      PT SHORT TERM GOAL #3   Title Pt will amb. 55' without device with SBA for increased household amb.    Baseline pt using SPC    Time 3    Period Weeks    Status Met - 03-09-22   Target Date 03/12/22      PT SHORT TERM GOAL #4   Title Independent in HEP  for Lt hip musc. strengthening and ITB stretching.    Time 3    Period Weeks    Status Ongoing - pt has only had 1 land session since eval due to having had cytoscopy in early July   Target Date 03/12/22             LONG TERM GOALS: Target date: 04-02-22:       New target date 04-13-22 (3 additional visits in [place])    PT Long Term Goals - 02/09/22 1952       PT LONG TERM GOAL #1   Title Independent in aquatic HEP to be continued at facility of pt's choice upon discharge from PT.    Time 6    Period Weeks    Status Ongoing   Target Date 04/02/22      PT LONG TERM GOAL #2   Title Pt will report reduced back/hip pain to </= 2/10 with weight bearing.    Baseline 2/10 Lt hip pain:  3-4/10 LBP reported with weight bearing    Time 6    Period Weeks    Status Partially Met   Target Date 04/02/22      PT LONG TERM GOAL #3   Title Pt will perform sit to stand transfer with bil. UE support from chair with c/o pain </2/10 in low back and Lt hip.    Baseline 4-5/10 on 02-09-22; 2/10 on 04-05-22   Time 6    Period Weeks    Status Met   Target Date 04/02/22      PT LONG TERM GOAL #4   Title Improve TUG score by at least 3 secs without assistive device to demo improved functional mobility.    Baseline TBA    Time 6    Period Weeks    Status Met   Target Date 04/02/22      PT LONG TERM GOAL #5   Title Pt will tolerate lying supine for at least 1 hour for increased comfort with sleeping.    Baseline < 30" - 02-09-22    Time 6    Period Weeks    Status ongoing   Target Date 04/02/22            ASSESSMENT:  CLINICAL IMPRESSION:  This 10th visit progress note covers dates 02-09-22 - 04-05-22; pt has met LTG's #3 & 4 with LTG's #1 &  5 ongoing:  LTG #2 is partially met as pt continues to report LBP of fluctuating intensity.  Pt is now able to amb. Community distances with use of SPC and able to ambulate in home without use of assistive device.  Pt reports significant improvement in Lt  hip pain with no pain reported.  Plan for 3 additional PT sessions and then D/C due to progress achieved. Cont with POC.       OBJECTIVE IMPAIRMENTS decreased balance, difficulty walking, decreased strength, postural dysfunction, and pain.   ACTIVITY LIMITATIONS carrying, bending, standing, squatting, sleeping, stairs, and locomotion level  PARTICIPATION LIMITATIONS: meal prep, cleaning, laundry, shopping, community activity, and yard work  PERSONAL FACTORS Past/current experiences, Time since onset of injury/illness/exacerbation, and 1-2 comorbidities: low back pain  are also affecting patient's functional outcome.   REHAB POTENTIAL: Good  CLINICAL DECISION MAKING: Evolving/moderate complexity  EVALUATION COMPLEXITY: Moderate  PLAN: PT FREQUENCY: 2x/week  PT DURATION: 6 weeks - plan for 6 land visits, 6 aquatic visits  PLANNED INTERVENTIONS: Therapeutic exercises, Therapeutic activity, Neuromuscular re-education, Balance training, Gait training, Patient/Family education, Stair training, Aquatic Therapy, Cryotherapy, and Manual therapy  PLAN FOR NEXT SESSION:  cont with core strengthening, LLE strengthening    Elton Heid, [place], PT 04/05/2022, 8:14 PM

## 2022-04-06 ENCOUNTER — Ambulatory Visit: Payer: Medicare Other | Admitting: Physical Therapy

## 2022-04-06 DIAGNOSIS — M6281 Muscle weakness (generalized): Secondary | ICD-10-CM | POA: Diagnosis not present

## 2022-04-06 DIAGNOSIS — M25552 Pain in left hip: Secondary | ICD-10-CM | POA: Diagnosis not present

## 2022-04-06 DIAGNOSIS — R2689 Other abnormalities of gait and mobility: Secondary | ICD-10-CM | POA: Diagnosis not present

## 2022-04-06 DIAGNOSIS — R2681 Unsteadiness on feet: Secondary | ICD-10-CM

## 2022-04-06 NOTE — Therapy (Signed)
OUTPATIENT PHYSICAL THERAPY NEURO TREATMENT NOTE   Patient Name: Brandon [name] MRN: [id number] DOB:1946/02/01, 76 y.o., male Today's Date: 04/07/2022   PCP: Sharilyn Sites, MD REFERRING PROVIDER: Florinda Marker    PT End of Session - 04/07/22 1550     Visit Number 11    Number of Visits 13    Date for PT Re-Evaluation 04/02/22   pushed out due to scheduling   Authorization Type Medicare    Authorization Time Period 02-08-22 - 05-09-22    PT Start Time 0934    PT Stop Time 1018    PT Time Calculation (min) 44 min    Equipment Utilized During Treatment Other (comment)   pool noodle, small bar bells, aquatic weight - 2.5#   Activity Tolerance Patient tolerated treatment well    Behavior During Therapy [facility] for tasks assessed/performed                Past Medical History:  Diagnosis Date   Arthritis    Coronary atherosclerosis of native coronary artery    Multivessel status post CABG March 2015   DDD (degenerative disc disease), lumbar    Essential hypertension    GERD (gastroesophageal reflux disease)    Hard of hearing both ears    Left ear is worst   Hemorrhoids    History of gout    History of kidney stones    history of myasthenia Gravis 2021   History of [facility] spotted fever    Left ureteral  & renal stone    Lumbago    Mixed hyperlipidemia    Personal history of COVID-19 08/10/2021   Paxlovid   Plantar fascial fibromatosis    Postoperative atrial fibrillation (HCC)    Skin Cancer ([place])    Use of cane as ambulatory aid    Uses roller walker    UTI (urinary tract infection) 09/16/2021   Wears glasses    Past Surgical History:  Procedure Laterality Date   AMPUTATION Right 03/27/2014   Procedure: DEBRIDEMENT AND CLOSURE RIGHT INDEX FINGER;  Surgeon: [name], MD;  Location: [place];  Service: Orthopedics;  Laterality: Right;   BACK SURGERY     lower not sure when per wife on 10-01-2021   BICEPT TENODESIS  11/26/2011   Procedure:  BICEPT TENODESIS;  Surgeon: Augustin Schooling, MD;  Location: [place];  Service: Orthopedics;  Laterality: Left;   BIOPSY  08/03/2018   Procedure: BIOPSY;  Surgeon: Rogene Houston, MD;  [facility];  Service: Endoscopy;;  antral   Cataract surgery Bilateral    yrs ago per wife on 10-01-2021   CHOLECYSTECTOMY N/A 09/22/2018   Procedure: LAPAROSCOPIC CHOLECYSTECTOMY;  Surgeon: Virl Cagey, MD;  [facility]: [facility];  Service: General;  Laterality: N/A;   COLONOSCOPY N/A 12/26/2014   Procedure: COLONOSCOPY;  Surgeon: Rogene Houston, MD;  [facility];  Service: Endoscopy;  Laterality: N/A;  730   CORONARY ARTERY BYPASS GRAFT N/A 10/19/2013   Procedure: CORONARY ARTERY BYPASS GRAFTING (CABG);  Surgeon: Rexene Alberts, MD;  Location: [place];  Service: Open Heart Surgery;  Laterality: N/A;  CABG times four using left internal mammary artery and left leg saphenous vein, incision made on right leg but no vein removed   CYSTOSCOPY W/ URETERAL STENT PLACEMENT Right 02/22/2022   Procedure: CYSTOSCOPY WITH RETROGRADE PYELOGRAM/URETERAL STENT PLACEMENT;  Surgeon: Ceasar Mons, MD;  [facility];  Service: Urology;  Laterality: Right;   CYSTOSCOPY  WITH RETROGRADE PYELOGRAM, URETEROSCOPY AND STENT PLACEMENT Left 08/24/2016   Procedure: CYSTOSCOPY WITH LEFT RETROGRADE PYELOGRAM, LEFT URETEROSCOPY, BASKET EXTRACTION LEFT URETERAL STONE;  Surgeon: Irine Seal, MD;  [facility];  Service: Urology;  Laterality: Left;   CYSTOSCOPY/URETEROSCOPY/HOLMIUM LASER/STENT PLACEMENT Left 10/06/2021   Procedure: CYSTOSCOPY LEFT RETROGRADE PYELOGRAM URETEROSCOPY/HOLMIUM LASER/STENT PLACEMENT;  Surgeon: Irine Seal, MD;  Location: [facility];  Service: Urology;  Laterality: Left;   CYSTOSCOPY/URETEROSCOPY/HOLMIUM LASER/STENT PLACEMENT Right 03/01/2022   Procedure: CYSTOSCOPY RIGHT URETEROSCOPY/HOLMIUM LASER/STENT PLACEMENT;  Surgeon: Irine Seal, MD;  [facility];   Service: Urology;  Laterality: Right;   ESOPHAGOGASTRODUODENOSCOPY N/A 08/03/2018   Procedure: ESOPHAGOGASTRODUODENOSCOPY (EGD);  Surgeon: Rogene Houston, MD;  [facility];  Service: Endoscopy;  Laterality: N/A;  2:55   EXTRACORPOREAL SHOCK WAVE LITHOTRIPSY Right 06/18/2019   Procedure: EXTRACORPOREAL SHOCK WAVE LITHOTRIPSY (ESWL);  Surgeon: Lucas Mallow, MD;  [facility];  Service: Urology;  Laterality: Right;   INTRAOPERATIVE TRANSESOPHAGEAL ECHOCARDIOGRAM N/A 10/19/2013   Procedure: INTRAOPERATIVE TRANSESOPHAGEAL ECHOCARDIOGRAM;  Surgeon: Rexene Alberts, MD;  Location: [place];  Service: Open Heart Surgery;  Laterality: N/A;   KYPHOPLASTY N/A 03/27/2021   Procedure: Lumbar One and Lumbar Two Kyphoplasty;  Surgeon: Erline Levine, MD;  Location: [place];  Service: Neurosurgery;  Laterality: N/A;   left hand carpal tunnel release  04/04/2015   LEFT HEART CATHETERIZATION WITH CORONARY ANGIOGRAM N/A 10/09/2013   Procedure: LEFT HEART CATHETERIZATION WITH CORONARY ANGIOGRAM;  Surgeon: Burnell Blanks, MD;  Location: [facility];  Service: Cardiovascular;  Laterality: N/A;   Left shoulder rotator cuff repair  12/04/2011   MOHS procedure  01/06/2021   dr Harl Bowie on left hand   ORIF PERIPROSTHETIC FRACTURE Left 03/19/2020   Procedure: OPEN REDUCTION INTERNAL FIXATION (ORIF) PERIPROSTHETIC FRACTURE LEFT FEMUR WITH FEMORAL REVISION;  Surgeon: Gaynelle Arabian, MD;  [facility];  Service: Orthopedics;  Laterality: Left;   TOTAL HIP ARTHROPLASTY Left 01/28/2020   Procedure: TOTAL HIP ARTHROPLASTY ANTERIOR APPROACH;  Surgeon: Gaynelle Arabian, MD;  [facility];  Service: Orthopedics;  Laterality: Left;  184mn   TOTAL KNEE ARTHROPLASTY Left 04/18/2001   traumaticvamputation of finger  2015   VASECTOMY     yrs ago per wife on 10-01-2021   Patient Active Problem List   Diagnosis Date Noted   Fracture, lumbar vertebra, compression ([place] 03/27/2021   Atrial  fibrillation with RVR ([place]    Periprosthetic fracture around internal prosthetic left hip joint ([place] 03/19/2020   OA (osteoarthritis) of hip 01/28/2020   Closed displaced fracture of left femoral neck ([facility] 01/28/2020   History of colonic polyps 11/12/2019   GERD (gastroesophageal reflux disease) 11/06/2019   Myasthenia gravis with (acute) exacerbation ([place] 11/06/2019   Acute diarrhea 09/17/2019   Heme positive stool 09/17/2019   Unspecified osteoarthritis, unspecified site    Hyperlipidemia, unspecified    Personal history of other diseases of the musculoskeletal system and connective tissue    Atherosclerotic heart disease of native coronary artery without angina pectoris    Hypertensive heart disease without heart failure    Melena    Plantar fascial fibromatosis    Pleurodynia    Calculus of gallbladder without cholecystitis without obstruction 09/14/2018   Nausea without vomiting 06/05/2018   Postoperative atrial fibrillation ([place] 11/09/2013   S/P CABG x 4 10/19/2013   Coronary atherosclerosis of native coronary artery 10/18/2013   Essential hypertension, benign 09/18/2013   Family history of hypertrophic cardiomyopathy 09/18/2013  Mixed hyperlipidemia     ONSET DATE: 12-29-21 (date of L2-3 discectomy surgery)  REFERRING DIAG: J49.702 (ICD-10-CM) - Presence of left artificial hip joint ; Lt trochanteric bursitis  THERAPY DIAG:  Muscle weakness (generalized)  Unsteadiness on feet  Rationale for Evaluation and Treatment Rehabilitation  SUBJECTIVE:                                                                                                                                                                                              SUBJECTIVE STATEMENT:     Pt reports he is doing pretty well today - continues to c/o LBP which he states is worse first thing in the morning when he gets up; states back muscles loosen up after he gets up and takes his dog for a walk  outside and gets moving around;  pt states MD told him he would probably always have some back pain Pt asks if it would be possible to continue with PT after next week (anticipated D/C next week); states he would like to continue PT for another month, until he goes on his scheduled fishing trip in Oct. [place]   Pt accompanied by:  wife, [name]  PERTINENT HISTORY: Mr. Felmlee is a 76 year old male with PMH of Myasthenia Gravis, osteoporosis, CAD who presented to [facility] on 12/11/21 for initial neurosurgical evaluation of low back pain referred by Dr. Sharilyn Sites. He has a history of an L3-S1 fusion with Dr. Glenna Fellows in 2016 (retired) as well as L1/L2 kyphoplasty on 03/27/21 with Dr. Erline Levine.  He reported good relief with lumbar fusion until the last 3 years.  He was diagnosed with myasthenia gravis in 2021 and was placed on steroids.  He feels that this contributed to to his osteoporosis.  He reported low back pain that radiated to his left buttock and groin.  Pain was sharp rated as 10 out of 10 at its worst.  Pain was worse with lying, standing or walking.  He was taking hydrocodone prescribed by his PCP which does not really help.  He reported lower extremity weakness and lower extremity aching.  He had tried ESI with minimal relief.  He has a history of a left hip replacement. He is s/p left L2-3 MED on 12/29/21 with Dr. Linus Mako. [place]. Pt also has Myasthenia Gravis - diagnosed in Jan. 2021   PAIN:  Are you having pain? No pain reported in sitting position Pt states he will have low back pain if he stands or walks for prolonged time period - rates intensity varying from 4-5/10 depending on duration  of weight bearing   PRECAUTIONS: None  WEIGHT BEARING RESTRICTIONS No  FALLS: Has patient fallen in last 6 months? No  LIVING ENVIRONMENT: Lives with: lives with their family Lives in: House/apartment Stairs: Yes: External: 3 steps; on right going up Has following equipment  at home: Single point cane and Walker - 2 wheeled  PLOF: Independent  PATIENT GOALS decrease Lt hip and low back pain   OBJECTIVE:   04-06-22:  Exercises for bil. Hip and low back strengthening in standing with changes made in bridging exercises due to c/o pain experienced after bridging exercises with components added  TherEx:   Exercises: Supine Bridge  - 10 reps with 5 sec hold Pelvic tilt 10 reps - 5 sec hold  Standing hip extension, flexion and abduction with red theraband 10 reps each direction each leg  Step ups forward onto 6" step LLE 10 reps; RLE 10 reps onto 6" step Step down exercise for Lt eccentric quad strengthening from 6" step 10 reps; pt unable to perform RLE step down due to c/o Rt knee pain  Leg press - 70# 3 sets 10 reps bil. LE's  SciFit level 2.5 x 7" with UE's and LE's for LE strengthening and endurance training  Tall kneeling position - 10 reps mini squats; attempted quadruped position but pt unable to tolerate due to c/o pain in Rt elbow with weight bearing   PATIENT EDUCATION:   Revised HEP on 04-06-22 to include pelvic tilt and resisted hip strengthening with red theraband;  Medbridge (see below) Access Code: [email or web address] URL: [email or web address] Date: 04/07/2022 Prepared by: Ethelene Browns  Exercises - Standing Hip Flexion with Resistance Loop  - 1 x daily - 7 x weekly - 1 sets - 10 reps - 2-3 secs hold - Hip Abduction with Resistance Loop  - 1 x daily - 7 x weekly - 1 sets - 10 reps - 2-3 secs  hold - Hip Extension with Resistance Loop  - 1 x daily - 7 x weekly - 1 sets - 10 reps - Supine Pelvic Tilt  - 1 x daily - 7 x weekly - 1 sets - 10 reps - 5 sec hold    Education details: Medbridge HEP [email or web address] - see above - issued on 03-09-22 Person educated: Patient and Spouse Education method: Explanation and Demonstration, handout Education comprehension: verbalized understanding Access Code: [email or web address] URL:  [email or web address] Date: 04/07/2022 Prepared by: Ethelene Browns  Exercises - Standing Hip Flexion with Resistance Loop  - 1 x daily - 7 x weekly - 1 sets - 10 reps - 2-3 secs hold - Hip Abduction with Resistance Loop  - 1 x daily - 7 x weekly - 1 sets - 10 reps - 2-3 secs  hold - Hip Extension with Resistance Loop  - 1 x daily - 7 x weekly - 1 sets - 10 reps - Supine Pelvic Tilt  - 1 x daily - 7 x weekly - 1 sets - 10 reps - 5 sec hold  HOME EXERCISE PROGRAM: Medbridge [email or web address]  Access Code: [email or web address] URL: [email or web address] Date: 02/09/2022 Prepared by: Ethelene Browns  Exercises - Clamshell  - 1 x daily - 7 x weekly - 1-2 sets - 10 reps - 2-3 hold - Sidelying Hip Abduction  - 1 x daily - 7 x weekly - 1-2 sets - 10 reps - 2 hold  GOALS: Goals reviewed with patient? Yes  SHORT TERM GOALS: Target date: 03-12-22   PT Short Term Goals - 02/09/22 1945  PT SHORT TERM GOAL #1   Title Initiate aquatic therapy at [facility].; pt will amb. from car to pool area with RW with supervision with c/o Lt hip pain </= 5/10 after amb. this distance.    Time 3    Period Weeks    Status Ongoing - 03-09-22   Target Date 03/12/22      PT SHORT TERM GOAL #2   Title Pt will report decreased Lt hip pain to </= 4/10 with weight bearing.    Baseline pt reports Lt hip pain 7-8/10 with weight bearing    Time 3    Period Weeks    Status Ongoing - 03-09-22   Target Date 03/12/22      PT SHORT TERM GOAL #3   Title Pt will amb. 71' without device with SBA for increased household amb.    Baseline pt using SPC    Time 3    Period Weeks    Status Met - 03-09-22   Target Date 03/12/22      PT SHORT TERM GOAL #4   Title Independent in HEP for Lt hip musc. strengthening and ITB stretching.    Time 3    Period Weeks    Status Ongoing- 03-09-22 - pt has only had 1 land session since eval due to having had cytoscopy in early July   Target Date 03/12/22              LONG TERM GOALS: Target date: 04-09-22    PT Long Term Goals - 02/09/22 1952       PT LONG TERM GOAL #1   Title Independent in aquatic HEP to be continued at facility of pt's choice upon discharge from PT.    Time 6    Period Weeks    Status New    Target Date 04/02/22      PT LONG TERM GOAL #2   Title Pt will report reduced back/hip pain to </= 2/10 with weight bearing.    Baseline 4-5/10 reported with weight bearing    Time 6    Period Weeks    Status New    Target Date 04/02/22      PT LONG TERM GOAL #3   Title Pt will perform sit to stand transfer with bil. UE support from chair with c/o pain </2/10 in low back and Lt hip.    Baseline 4-5/10 on 02-09-22    Time 6    Period Weeks    Status New    Target Date 04/02/22      PT LONG TERM GOAL #4   Title Improve TUG score by at least 3 secs without assistive device to demo improved functional mobility.    Baseline 12.8 secs without use of device on 03-09-22   Time 6    Period Weeks    Status New    Target Date 04/02/22      PT LONG TERM GOAL #5   Title Pt will tolerate lying supine for at least 1 hour for increased comfort with sleeping.    Baseline < 30" - 02-09-22    Time 6    Period Weeks    Status New    Target Date 04/02/22            ASSESSMENT:  CLINICAL IMPRESSION: PT session focused on bil. Hip and low back strengthening exercises in standing position and use of machines including leg press & Scifit rather than mat exercises in anti-gravity position.  Pt did perform bridging exercise and pelvic tilt exercise on mat for low back strengthening, but no component added to bridge ex. To minimize stress and pain in low back region. Pt is progressing well towards LTG's; continue with POC.    OBJECTIVE IMPAIRMENTS decreased balance, difficulty walking, decreased strength, postural dysfunction, and pain.   ACTIVITY LIMITATIONS carrying, bending, standing, squatting, sleeping, stairs, and locomotion  level  PARTICIPATION LIMITATIONS: meal prep, cleaning, laundry, shopping, community activity, and yard work  PERSONAL FACTORS Past/current experiences, Time since onset of injury/illness/exacerbation, and 1-2 comorbidities: low back pain  are also affecting patient's functional outcome.   REHAB POTENTIAL: Good  CLINICAL DECISION MAKING: Evolving/moderate complexity  EVALUATION COMPLEXITY: Moderate  PLAN: PT FREQUENCY: 2x/week  PT DURATION: 6 weeks - plan for 6 land visits, 6 aquatic visits  PLANNED INTERVENTIONS: Therapeutic exercises, Therapeutic activity, Neuromuscular re-education, Balance training, Gait training, Patient/Family education, Stair training, Aquatic Therapy, Cryotherapy, and Manual therapy  PLAN FOR NEXT SESSION:  cont with LE strengthening - was pain and soreness less after this land session?   Alda Lea, PT 04/07/2022, 3:52 PM

## 2022-04-07 ENCOUNTER — Encounter: Payer: Self-pay | Admitting: Physical Therapy

## 2022-04-12 ENCOUNTER — Encounter: Payer: Self-pay | Admitting: Physical Therapy

## 2022-04-12 ENCOUNTER — Ambulatory Visit: Payer: Medicare Other | Admitting: Physical Therapy

## 2022-04-12 DIAGNOSIS — R2681 Unsteadiness on feet: Secondary | ICD-10-CM

## 2022-04-12 DIAGNOSIS — M25552 Pain in left hip: Secondary | ICD-10-CM | POA: Diagnosis not present

## 2022-04-12 DIAGNOSIS — R2689 Other abnormalities of gait and mobility: Secondary | ICD-10-CM | POA: Diagnosis not present

## 2022-04-12 DIAGNOSIS — M6281 Muscle weakness (generalized): Secondary | ICD-10-CM

## 2022-04-12 NOTE — Therapy (Signed)
OUTPATIENT PHYSICAL THERAPY TREATMENT NOTE   Patient Name: Brandon [name] MRN: [id number] DOB:[birth year], 75 y.o., male Today's Date: 04/12/2022   PCP: Sharilyn Sites, MD REFERRING PROVIDER: Florinda Marker    PT End of Session - 04/12/22 2013     Visit Number 12    Number of Visits 13    Date for PT Re-Evaluation 04/02/22   pushed out due to scheduling   Authorization Type Medicare    Authorization Time Period 02-08-22 - 05-09-22    PT Start Time 1315    PT Stop Time 1400    PT Time Calculation (min) 45 min    Equipment Utilized During Treatment Other (comment)   pool noodle, small bar bells, aquatic weight - 2.5#   Activity Tolerance Patient tolerated treatment well    Behavior During Therapy [facility] for tasks assessed/performed              Past Medical History:  Diagnosis Date   Arthritis    Coronary atherosclerosis of native coronary artery    Multivessel status post CABG March 2015   DDD (degenerative disc disease), lumbar    Essential hypertension    GERD (gastroesophageal reflux disease)    Hard of hearing both ears    Left ear is worst   Hemorrhoids    History of gout    History of kidney stones    history of myasthenia Gravis 2021   History of [facility] spotted fever    Left ureteral  & renal stone    Lumbago    Mixed hyperlipidemia    Personal history of COVID-19 08/10/2021   Paxlovid   Plantar fascial fibromatosis    Postoperative atrial fibrillation (HCC)    Skin Cancer ([place])    Use of cane as ambulatory aid    Uses roller walker    UTI (urinary tract infection) 09/16/2021   Wears glasses    Past Surgical History:  Procedure Laterality Date   AMPUTATION Right 03/27/2014   Procedure: DEBRIDEMENT AND CLOSURE RIGHT INDEX FINGER;  Surgeon: [name], MD;  Location: [place];  Service: Orthopedics;  Laterality: Right;   BACK SURGERY     lower not sure when per wife on 10-01-2021   BICEPT TENODESIS  11/26/2011   Procedure: BICEPT  TENODESIS;  Surgeon: Augustin Schooling, MD;  Location: [place];  Service: Orthopedics;  Laterality: Left;   BIOPSY  08/03/2018   Procedure: BIOPSY;  Surgeon: Rogene Houston, MD;  [facility];  Service: Endoscopy;;  antral   Cataract surgery Bilateral    yrs ago per wife on 10-01-2021   CHOLECYSTECTOMY N/A 09/22/2018   Procedure: LAPAROSCOPIC CHOLECYSTECTOMY;  Surgeon: Virl Cagey, MD;  [facility]: [facility];  Service: General;  Laterality: N/A;   COLONOSCOPY N/A 12/26/2014   Procedure: COLONOSCOPY;  Surgeon: Rogene Houston, MD;  [facility];  Service: Endoscopy;  Laterality: N/A;  730   CORONARY ARTERY BYPASS GRAFT N/A 10/19/2013   Procedure: CORONARY ARTERY BYPASS GRAFTING (CABG);  Surgeon: Rexene Alberts, MD;  Location: [place];  Service: Open Heart Surgery;  Laterality: N/A;  CABG times four using left internal mammary artery and left leg saphenous vein, incision made on right leg but no vein removed   CYSTOSCOPY W/ URETERAL STENT PLACEMENT Right 02/22/2022   Procedure: CYSTOSCOPY WITH RETROGRADE PYELOGRAM/URETERAL STENT PLACEMENT;  Surgeon: Ceasar Mons, MD;  [facility]: [facility];  Service: Urology;  Laterality: Right;   CYSTOSCOPY WITH RETROGRADE PYELOGRAM,  URETEROSCOPY AND STENT PLACEMENT Left 08/24/2016   Procedure: CYSTOSCOPY WITH LEFT RETROGRADE PYELOGRAM, LEFT URETEROSCOPY, BASKET EXTRACTION LEFT URETERAL STONE;  Surgeon: Irine Seal, MD;  [facility];  Service: Urology;  Laterality: Left;   CYSTOSCOPY/URETEROSCOPY/HOLMIUM LASER/STENT PLACEMENT Left 10/06/2021   Procedure: CYSTOSCOPY LEFT RETROGRADE PYELOGRAM URETEROSCOPY/HOLMIUM LASER/STENT PLACEMENT;  Surgeon: Irine Seal, MD;  Location: [facility];  Service: Urology;  Laterality: Left;   CYSTOSCOPY/URETEROSCOPY/HOLMIUM LASER/STENT PLACEMENT Right 03/01/2022   Procedure: CYSTOSCOPY RIGHT URETEROSCOPY/HOLMIUM LASER/STENT PLACEMENT;  Surgeon: Irine Seal, MD;  [facility];  Service:  Urology;  Laterality: Right;   ESOPHAGOGASTRODUODENOSCOPY N/A 08/03/2018   Procedure: ESOPHAGOGASTRODUODENOSCOPY (EGD);  Surgeon: Rogene Houston, MD;  [facility];  Service: Endoscopy;  Laterality: N/A;  2:55   EXTRACORPOREAL SHOCK WAVE LITHOTRIPSY Right 06/18/2019   Procedure: EXTRACORPOREAL SHOCK WAVE LITHOTRIPSY (ESWL);  Surgeon: Lucas Mallow, MD;  [facility];  Service: Urology;  Laterality: Right;   INTRAOPERATIVE TRANSESOPHAGEAL ECHOCARDIOGRAM N/A 10/19/2013   Procedure: INTRAOPERATIVE TRANSESOPHAGEAL ECHOCARDIOGRAM;  Surgeon: Rexene Alberts, MD;  Location: [place];  Service: Open Heart Surgery;  Laterality: N/A;   KYPHOPLASTY N/A 03/27/2021   Procedure: Lumbar One and Lumbar Two Kyphoplasty;  Surgeon: Erline Levine, MD;  Location: [place];  Service: Neurosurgery;  Laterality: N/A;   left hand carpal tunnel release  04/04/2015   LEFT HEART CATHETERIZATION WITH CORONARY ANGIOGRAM N/A 10/09/2013   Procedure: LEFT HEART CATHETERIZATION WITH CORONARY ANGIOGRAM;  Surgeon: Burnell Blanks, MD;  [facility];  Service: Cardiovascular;  Laterality: N/A;   Left shoulder rotator cuff repair  12/04/2011   MOHS procedure  01/06/2021   dr Harl Bowie on left hand   ORIF PERIPROSTHETIC FRACTURE Left 03/19/2020   Procedure: OPEN REDUCTION INTERNAL FIXATION (ORIF) PERIPROSTHETIC FRACTURE LEFT FEMUR WITH FEMORAL REVISION;  Surgeon: Gaynelle Arabian, MD;  [facility];  Service: Orthopedics;  Laterality: Left;   TOTAL HIP ARTHROPLASTY Left 01/28/2020   Procedure: TOTAL HIP ARTHROPLASTY ANTERIOR APPROACH;  Surgeon: Gaynelle Arabian, MD;  [facility];  Service: Orthopedics;  Laterality: Left;  160mn   TOTAL KNEE ARTHROPLASTY Left 04/18/2001   traumaticvamputation of finger  2015   VASECTOMY     yrs ago per wife on 10-01-2021   Patient Active Problem List   Diagnosis Date Noted   Fracture, [place] vertebra, compression ([place] 03/27/2021   Atrial fibrillation  with RVR ([place]    Periprosthetic fracture around internal prosthetic left hip joint ([place] 03/19/2020   OA (osteoarthritis) of hip 01/28/2020   Closed displaced fracture of left femoral neck ([place] 01/28/2020   History of colonic polyps 11/12/2019   GERD (gastroesophageal reflux disease) 11/06/2019   Myasthenia gravis with (acute) exacerbation ([place] 11/06/2019   Acute diarrhea 09/17/2019   Heme positive stool 09/17/2019   Unspecified osteoarthritis, unspecified site    Hyperlipidemia, unspecified    Personal history of other diseases of the musculoskeletal system and connective tissue    Atherosclerotic heart disease of native coronary artery without angina pectoris    Hypertensive heart disease without heart failure    Melena    Plantar fascial fibromatosis    Pleurodynia    Calculus of gallbladder without cholecystitis without obstruction 09/14/2018   Nausea without vomiting 06/05/2018   Postoperative atrial fibrillation ([place] 11/09/2013   S/P CABG x 4 10/19/2013   Coronary atherosclerosis of native coronary artery 10/18/2013   Essential hypertension, benign 09/18/2013   Family history of hypertrophic cardiomyopathy 09/18/2013   Mixed hyperlipidemia  ONSET DATE: 12-29-21 (date of L2-3 MED surgery)  REFERRING DIAG: N82.956 (ICD-10-CM) - Presence of left artificial hip joint ; Lt trochanteric bursitis  THERAPY DIAG:  Muscle weakness (generalized)  Unsteadiness on feet  Other abnormalities of gait and mobility  Rationale for Evaluation and Treatment Rehabilitation  SUBJECTIVE:                                                                                                                                                                                              SUBJECTIVE STATEMENT: Pt reports he feels pretty good today - no problems or changes reported   Pt accompanied by:  wife, [name]  PERTINENT HISTORY: Mr. Swaim is a 76 year old male with PMH of Myasthenia Gravis,  osteoporosis, CAD who presented to [facility] on 12/11/21 for initial neurosurgical evaluation of low back pain referred by Dr. Sharilyn Sites. He has a history of an L3-S1 fusion with Dr. Glenna Fellows in 2016 (retired) as well as L1/L2 kyphoplasty on 03/27/21 with Dr. Erline Levine.  He reported good relief with lumbar fusion until the last 3 years.  He was diagnosed with myasthenia gravis in 2021 and was placed on steroids.  He feels that this contributed to to his osteoporosis.  He reported low back pain that radiated to his left buttock and groin.  Pain was sharp rated as 10 out of 10 at its worst.  Pain was worse with lying, standing or walking.  He was taking hydrocodone prescribed by his PCP which does not really help.  He reported lower extremity weakness and lower extremity aching.  He had tried ESI with minimal relief.  He has a history of a left hip replacement. He is s/p left L2-3 MED on 12/29/21 with Dr. Linus Mako. [place]. Pt also has Myasthenia Gravis - diagnosed in Jan. 2021   PAIN:  Are you having pain? Yes low back and Lt hip Low back 2-3/10 with weight bearing; no pain in sitting;  No hip pain with weight bearing; no pain in sitting  Sitting helps the back pain; walking helps the hip pain - trying to walk 10'-15' 2-3 times a day Aggravating factors - weight bearing for back; pressure aggravates the hip  PRECAUTIONS: None  WEIGHT BEARING RESTRICTIONS No  FALLS: Has patient fallen in last 6 months? No  LIVING ENVIRONMENT: Lives with: lives with their family Lives in: House/apartment Stairs: Yes: External: 3 steps; on right going up Has following equipment at home: Single point cane and Walker - 2 wheeled  PLOF: Independent  PATIENT GOALS decrease Lt hip and low back pain  OBJECTIVE: 04-12-22  Aquatic therapy at [place]. 90 degrees  Patient seen for aquatic therapy today.  Treatment took place in water 3.5-4.5 feet deep depending upon activity.  Pt entered and  exited the pool via step negotiation with use of hand rails modified independently.  Pt performed water walking forwards 18' x 4 reps, backwards 18' x 4 reps and sideways 18' x 4 reps each for warm up  Pt performed runner's stretch - for hamstring/gastroc stretch - bil. LE's 30 sec hold x 1 rep each leg;   Gastroc stretch bil. LE's with toes/forefeet on pool wall 30 sec hold x 1 rep   Marching in place 10 reps without UE support; 2nd set - marching in place - holding bar bell - contralateral movement of UE/LE  with 1-2 sec hold to facilitate improved SLS on each leg - 18' x 4 reps forward,18' x 2 reps backwards   Pt performed squats x 10 reps with bil. UE support on pool edge; unilateral squats each leg 10 reps with bil. UE support on pool wall  Standing hip abduction with use of 2.5# aquatic weight 10 reps bil. LE's; pt performed hip flexion, extension and marching 10 reps each leg with UE support with use of aquatic weight 2.5# each leg;  pt performed hip flexion/extension with flexed knee on each leg with use of 2.5# aquatic weight for strengthening and for improved SLS on each leg  Pt performed crossovers front 18' x 2 reps, stepping behind 18' x 2 reps; braiding 18' x 4 reps with UE support on yellow noodle for support with all these exercises for assist with balance  Sidestepping with squats with small bar bells for UE resistance and for increased core stabilization with exercise - 18' x 4 reps  Trunk rotation with wide BOS with use of yellow noodle 10 reps to each side for increased trunk and lumbar ROM 10 reps to each side   Pt performed closed chain strengthening exercise for hip extensor strengthening - pushing yellow noodle down toward floor 2 sets 10 reps each leg; then knee flexion/extension 10 reps each leg with foot on yellow noodle for resistance  Ai Chi posture "Balancing" 10 reps each side with UE support on edge of pool prn for assist with balance   Pt requires buoyancy of  water for support with standing balance and with gait training without UE support; buoyancy of water also needed for joint unloading for pain reduction with weight bearing; buoyancy also provides spinal decompression for reduced back pain in standing;  viscosity of water needed for resistance for strengthening bil. LE's:  water current provides perturbations for challenges with balance that cannot be safely performed on land          PATIENT EDUCATION: Education details: [employer] HEP [email or web address] - see above - issued on 03-09-22 Person educated: Patient and Spouse Education method: Customer service manager, handout Education comprehension: verbalized understanding   HOME EXERCISE PROGRAM: [employer] [email or web address]  Access Code: [email or web address] URL: [email or web address] Date: 02/09/2022 Prepared by: Ethelene Browns  Exercises - Clamshell  - 1 x daily - 7 x weekly - 1-2 sets - 10 reps - 2-3 hold - Sidelying Hip Abduction  - 1 x daily - 7 x weekly - 1-2 sets - 10 reps - 2 hold  GOALS: Goals reviewed with patient? Yes  SHORT TERM GOALS: Target date: 03-12-22   PT Short Term Goals - 02/09/22 1945       PT SHORT TERM GOAL #  1   Title Initiate aquatic therapy at [facility].; pt will amb. from car to pool area with RW with supervision with c/o Lt hip pain </= 5/10 after amb. this distance.    Time 3    Period Weeks    Status Ongoing - 03-09-22   Target Date 03/12/22      PT SHORT TERM GOAL #2   Title Pt will report decreased Lt hip pain to </= 4/10 with weight bearing.    Baseline pt reports Lt hip pain 7-8/10 with weight bearing    Time 3    Period Weeks    Status Ongoing - 03-09-22   Target Date 03/12/22      PT SHORT TERM GOAL #3   Title Pt will amb. 35' without device with SBA for increased household amb.    Baseline pt using SPC    Time 3    Period Weeks    Status Met - 03-09-22   Target Date 03/12/22      PT SHORT TERM GOAL #4   Title Independent in  HEP for Lt hip musc. strengthening and ITB stretching.    Time 3    Period Weeks    Status Ongoing - pt has only had 1 land session since eval due to having had cytoscopy in early July   Target Date 03/12/22             LONG TERM GOALS: Target date: 04-02-22:       New target date 04-13-22 (3 additional visits in [place])    PT Long Term Goals - 02/09/22 1952       PT LONG TERM GOAL #1   Title Independent in aquatic HEP to be continued at facility of pt's choice upon discharge from PT.    Time 6    Period Weeks    Status Ongoing   Target Date 04/02/22      PT LONG TERM GOAL #2   Title Pt will report reduced back/hip pain to </= 2/10 with weight bearing.    Baseline 2/10 Lt hip pain:  3-4/10 LBP reported with weight bearing    Time 6    Period Weeks    Status Partially Met   Target Date 04/02/22      PT LONG TERM GOAL #3   Title Pt will perform sit to stand transfer with bil. UE support from chair with c/o pain </2/10 in low back and Lt hip.    Baseline 4-5/10 on 02-09-22; 2/10 on 04-05-22   Time 6    Period Weeks    Status Met   Target Date 04/02/22      PT LONG TERM GOAL #4   Title Improve TUG score by at least 3 secs without assistive device to demo improved functional mobility.    Baseline TBA    Time 6    Period Weeks    Status Met   Target Date 04/02/22      PT LONG TERM GOAL #5   Title Pt will tolerate lying supine for at least 1 hour for increased comfort with sleeping.    Baseline < 30" - 02-09-22    Time 6    Period Weeks    Status ongoing   Target Date 04/02/22            ASSESSMENT:  CLINICAL IMPRESSION: Aquatic therapy session focused on water walking in various directions and also on balance and strengthening exercises.  Pt is improving with  maintaining balance in pool with only UE support on pool noodle for assist with braiding exercise in today's session.  Pt reported no low back or Lt hip pain in today's aquatic session.  Cont with POC.        OBJECTIVE IMPAIRMENTS decreased balance, difficulty walking, decreased strength, postural dysfunction, and pain.   ACTIVITY LIMITATIONS carrying, bending, standing, squatting, sleeping, stairs, and locomotion level  PARTICIPATION LIMITATIONS: meal prep, cleaning, laundry, shopping, community activity, and yard work  PERSONAL FACTORS Past/current experiences, Time since onset of injury/illness/exacerbation, and 1-2 comorbidities: low back pain  are also affecting patient's functional outcome.   REHAB POTENTIAL: Good  CLINICAL DECISION MAKING: Evolving/moderate complexity  EVALUATION COMPLEXITY: Moderate  PLAN: PT FREQUENCY: 2x/week  PT DURATION: 6 weeks - plan for 6 land visits, 6 aquatic visits  PLANNED INTERVENTIONS: Therapeutic exercises, Therapeutic activity, Neuromuscular re-education, Balance training, Gait training, Patient/Family education, Stair training, Aquatic Therapy, Cryotherapy, and Manual therapy  PLAN FOR NEXT SESSION:  cont with core strengthening, LLE strengthening    , Jenness Corner, PT 04/12/2022, 8:14 PM

## 2022-04-13 ENCOUNTER — Ambulatory Visit: Payer: Medicare Other | Admitting: Physical Therapy

## 2022-04-13 DIAGNOSIS — R2689 Other abnormalities of gait and mobility: Secondary | ICD-10-CM

## 2022-04-13 DIAGNOSIS — M6281 Muscle weakness (generalized): Secondary | ICD-10-CM

## 2022-04-13 DIAGNOSIS — R2681 Unsteadiness on feet: Secondary | ICD-10-CM

## 2022-04-13 DIAGNOSIS — M25552 Pain in left hip: Secondary | ICD-10-CM | POA: Diagnosis not present

## 2022-04-13 NOTE — Therapy (Signed)
OUTPATIENT PHYSICAL THERAPY NEURO TREATMENT NOTE   Patient Name: Brandon [name] MRN: [id number] DOB:02-11-1946, 76 y.o., male Today's Date: 04/14/2022   PCP: Sharilyn Sites, MD REFERRING PROVIDER: Florinda Marker    PT End of Session - 04/14/22 1645     Visit Number 13    Number of Visits 21    Date for PT Re-Evaluation 05/14/22   pushed out due to scheduling   Authorization Type Medicare    Authorization Time Period 02-08-22 - 05-09-22; 04-13-22 - 05-14-22    PT Start Time 0846    PT Stop Time 0930    PT Time Calculation (min) 44 min    Equipment Utilized During Treatment --   pool noodle, small bar bells, aquatic weight - 2.5#   Activity Tolerance Patient tolerated treatment well    Behavior During Therapy [facility] for tasks assessed/performed                 Past Medical History:  Diagnosis Date   Arthritis    Coronary atherosclerosis of native coronary artery    Multivessel status post CABG March 2015   DDD (degenerative disc disease), lumbar    Essential hypertension    GERD (gastroesophageal reflux disease)    Hard of hearing both ears    Left ear is worst   Hemorrhoids    History of gout    History of kidney stones    history of myasthenia Gravis 2021   History of [facility] spotted fever    Left ureteral  & renal stone    Lumbago    Mixed hyperlipidemia    Personal history of COVID-19 08/10/2021   Paxlovid   Plantar fascial fibromatosis    Postoperative atrial fibrillation (HCC)    Skin Cancer ([place])    Use of cane as ambulatory aid    Uses roller walker    UTI (urinary tract infection) 09/16/2021   Wears glasses    Past Surgical History:  Procedure Laterality Date   AMPUTATION Right 03/27/2014   Procedure: DEBRIDEMENT AND CLOSURE RIGHT INDEX FINGER;  Surgeon: [name], MD;  Location: [place];  Service: Orthopedics;  Laterality: Right;   BACK SURGERY     lower not sure when per wife on 10-01-2021   BICEPT TENODESIS  11/26/2011    Procedure: BICEPT TENODESIS;  Surgeon: Augustin Schooling, MD;  Location: [place];  Service: Orthopedics;  Laterality: Left;   BIOPSY  08/03/2018   Procedure: BIOPSY;  Surgeon: Rogene Houston, MD;  [facility];  Service: Endoscopy;;  antral   Cataract surgery Bilateral    yrs ago per wife on 10-01-2021   CHOLECYSTECTOMY N/A 09/22/2018   Procedure: LAPAROSCOPIC CHOLECYSTECTOMY;  Surgeon: Virl Cagey, MD;  [facility]: [facility];  Service: General;  Laterality: N/A;   COLONOSCOPY N/A 12/26/2014   Procedure: COLONOSCOPY;  Surgeon: Rogene Houston, MD;  [facility];  Service: Endoscopy;  Laterality: N/A;  730   CORONARY ARTERY BYPASS GRAFT N/A 10/19/2013   Procedure: CORONARY ARTERY BYPASS GRAFTING (CABG);  Surgeon: Rexene Alberts, MD;  Location: [place];  Service: Open Heart Surgery;  Laterality: N/A;  CABG times four using left internal mammary artery and left leg saphenous vein, incision made on right leg but no vein removed   CYSTOSCOPY W/ URETERAL STENT PLACEMENT Right 02/22/2022   Procedure: CYSTOSCOPY WITH RETROGRADE PYELOGRAM/URETERAL STENT PLACEMENT;  Surgeon: Ceasar Mons, MD;  [facility];  Service: Urology;  Laterality: Right;  CYSTOSCOPY WITH RETROGRADE PYELOGRAM, URETEROSCOPY AND STENT PLACEMENT Left 08/24/2016   Procedure: CYSTOSCOPY WITH LEFT RETROGRADE PYELOGRAM, LEFT URETEROSCOPY, BASKET EXTRACTION LEFT URETERAL STONE;  Surgeon: Irine Seal, MD;  [facility];  Service: Urology;  Laterality: Left;   CYSTOSCOPY/URETEROSCOPY/HOLMIUM LASER/STENT PLACEMENT Left 10/06/2021   Procedure: CYSTOSCOPY LEFT RETROGRADE PYELOGRAM URETEROSCOPY/HOLMIUM LASER/STENT PLACEMENT;  Surgeon: Irine Seal, MD;  Location: [facility];  Service: Urology;  Laterality: Left;   CYSTOSCOPY/URETEROSCOPY/HOLMIUM LASER/STENT PLACEMENT Right 03/01/2022   Procedure: CYSTOSCOPY RIGHT URETEROSCOPY/HOLMIUM LASER/STENT PLACEMENT;  Surgeon: Irine Seal, MD;  [facility];  Service: Urology;  Laterality: Right;   ESOPHAGOGASTRODUODENOSCOPY N/A 08/03/2018   Procedure: ESOPHAGOGASTRODUODENOSCOPY (EGD);  Surgeon: Rogene Houston, MD;  [facility];  Service: Endoscopy;  Laterality: N/A;  2:55   EXTRACORPOREAL SHOCK WAVE LITHOTRIPSY Right 06/18/2019   Procedure: EXTRACORPOREAL SHOCK WAVE LITHOTRIPSY (ESWL);  Surgeon: Lucas Mallow, MD;  [facility];  Service: Urology;  Laterality: Right;   INTRAOPERATIVE TRANSESOPHAGEAL ECHOCARDIOGRAM N/A 10/19/2013   Procedure: INTRAOPERATIVE TRANSESOPHAGEAL ECHOCARDIOGRAM;  Surgeon: Rexene Alberts, MD;  Location: [place];  Service: Open Heart Surgery;  Laterality: N/A;   KYPHOPLASTY N/A 03/27/2021   Procedure: Lumbar One and Lumbar Two Kyphoplasty;  Surgeon: Erline Levine, MD;  Location: [place];  Service: Neurosurgery;  Laterality: N/A;   left hand carpal tunnel release  04/04/2015   LEFT HEART CATHETERIZATION WITH CORONARY ANGIOGRAM N/A 10/09/2013   Procedure: LEFT HEART CATHETERIZATION WITH CORONARY ANGIOGRAM;  Surgeon: Burnell Blanks, MD;  Location: [facility] CATH LAB;  Service: Cardiovascular;  Laterality: N/A;   Left shoulder rotator cuff repair  12/04/2011   MOHS procedure  01/06/2021   dr Harl Bowie on left hand   ORIF PERIPROSTHETIC FRACTURE Left 03/19/2020   Procedure: OPEN REDUCTION INTERNAL FIXATION (ORIF) PERIPROSTHETIC FRACTURE LEFT FEMUR WITH FEMORAL REVISION;  Surgeon: Gaynelle Arabian, MD;  [facility];  Service: Orthopedics;  Laterality: Left;   TOTAL HIP ARTHROPLASTY Left 01/28/2020   Procedure: TOTAL HIP ARTHROPLASTY ANTERIOR APPROACH;  Surgeon: Gaynelle Arabian, MD;  [facility];  Service: Orthopedics;  Laterality: Left;  164mn   TOTAL KNEE ARTHROPLASTY Left 04/18/2001   traumaticvamputation of finger  2015   VASECTOMY     yrs ago per wife on 10-01-2021   Patient Active Problem List   Diagnosis Date Noted   Fracture, lumbar vertebra, compression ([place] 03/27/2021   Atrial  fibrillation with RVR ([place]    Periprosthetic fracture around internal prosthetic left hip joint ([place] 03/19/2020   OA (osteoarthritis) of hip 01/28/2020   Closed displaced fracture of left femoral neck ([place] 01/28/2020   History of colonic polyps 11/12/2019   GERD (gastroesophageal reflux disease) 11/06/2019   Myasthenia gravis with (acute) exacerbation [place] 11/06/2019   Acute diarrhea 09/17/2019   Heme positive stool 09/17/2019   Unspecified osteoarthritis, unspecified site    Hyperlipidemia, unspecified    Personal history of other diseases of the musculoskeletal system and connective tissue    Atherosclerotic heart disease of native coronary artery without angina pectoris    Hypertensive heart disease without heart failure    Melena    Plantar fascial fibromatosis    Pleurodynia    Calculus of gallbladder without cholecystitis without obstruction 09/14/2018   Nausea without vomiting 06/05/2018   Postoperative atrial fibrillation ([place] 11/09/2013   S/P CABG x 4 10/19/2013   Coronary atherosclerosis of native coronary artery 10/18/2013   Essential hypertension, benign 09/18/2013   Family history of hypertrophic cardiomyopathy 09/18/2013  Mixed hyperlipidemia     ONSET DATE: 12-29-21 (date of L2-3 discectomy surgery)  REFERRING DIAG: O53.664 (ICD-10-CM) - Presence of left artificial hip joint ; Lt trochanteric bursitis  THERAPY DIAG:  Muscle weakness (generalized)  Unsteadiness on feet  Other abnormalities of gait and mobility  Rationale for Evaluation and Treatment Rehabilitation  SUBJECTIVE:                                                                                                                                                                                              SUBJECTIVE STATEMENT:     Pt reports he passed 2 kidney stones last night, states they were not painful; pt reports feels pretty good right now; pt asks if insurance will approve more visits -  states he would like to continue therapy for a few more weeks    Pt accompanied by:  wife, [name]  PERTINENT HISTORY: Mr. Babers is a 76 year old male with PMH of Myasthenia Gravis, osteoporosis, CAD who presented to [facility] on 12/11/21 for initial neurosurgical evaluation of low back pain referred by Dr. Sharilyn Sites. He has a history of an L3-S1 fusion with Dr. Glenna Fellows in 2016 (retired) as well as L1/L2 kyphoplasty on 03/27/21 with Dr. Erline Levine.  He reported good relief with lumbar fusion until the last 3 years.  He was diagnosed with myasthenia gravis in 2021 and was placed on steroids.  He feels that this contributed to to his osteoporosis.  He reported low back pain that radiated to his left buttock and groin.  Pain was sharp rated as 10 out of 10 at its worst.  Pain was worse with lying, standing or walking.  He was taking hydrocodone prescribed by his PCP which does not really help.  He reported lower extremity weakness and lower extremity aching.  He had tried ESI with minimal relief.  He has a history of a left hip replacement. He is s/p left L2-3 MED on 12/29/21 with Dr. Linus Mako. [place]. Pt also has Myasthenia Gravis - diagnosed in Jan. 2021   PAIN:  Are you having pain? No pain reported in sitting position Pt states he will have low back pain if he stands or walks for prolonged time period - rates intensity varying from 4-5/10 depending on duration of weight bearing   PRECAUTIONS: None  WEIGHT BEARING RESTRICTIONS No  FALLS: Has patient fallen in last 6 months? No  LIVING ENVIRONMENT: Lives with: lives with their family Lives in: House/apartment Stairs: Yes: External: 3 steps; on right going up Has following equipment at home: Single point cane and Walker -  2 wheeled  PLOF: Independent  PATIENT GOALS decrease Lt hip and low back pain   OBJECTIVE:   04-13-22:  Exercises for bil. Hip and low back strengthening:   TherEx:   Exercises:  5 times sit to stand:   score 15.34 secs without UE support (from mat table)  Step ups forward onto 6" step LLE 10 reps; RLE 10 reps onto 6" step  Leg press - 70# 3 sets 10 reps bil. LE's  SciFit level 3.0 x 6" with UE's and LE's for LE strengthening and endurance training  Gait;  pt amb. 230' without use of SPC with SBA (2 laps around track)    Gait velocity:  11.50 secs = 2.85  ft/sec without device   NeuroRe-ed:  TUG score 11.62 secs without device  RLE SLS ; 2.56, 5.53, and 1.75 secs   LLE SLS:  2.69, 1.65, 2.03 secs  Rockerboard - 10 reps 2 sets with UE support prn on // bars Stepping down to floor alternating feet with minimal UE support 10 reps each leg for improved SLS on each leg Standing on rockerboard - tapping 2 cones 3 reps straight ahead while standing on rockerboard; diagonal taps 3 reps with minimal UE support on // bars  PATIENT EDUCATION:   Revised HEP on 04-06-22 to include pelvic tilt and resisted hip strengthening with red theraband;  Medbridge (see below) Access Code: [email or web address] URL: [email or web address] Date: 04/07/2022 Prepared by: Ethelene Browns  Exercises - Standing Hip Flexion with Resistance Loop  - 1 x daily - 7 x weekly - 1 sets - 10 reps - 2-3 secs hold - Hip Abduction with Resistance Loop  - 1 x daily - 7 x weekly - 1 sets - 10 reps - 2-3 secs  hold - Hip Extension with Resistance Loop  - 1 x daily - 7 x weekly - 1 sets - 10 reps - Supine Pelvic Tilt  - 1 x daily - 7 x weekly - 1 sets - 10 reps - 5 sec hold    Education details: Medbridge HEP [email or web address] - see above - issued on 03-09-22 Person educated: Patient and Spouse Education method: Explanation and Demonstration, handout Education comprehension: verbalized understanding Access Code: [email or web address] URL: [email or web address] Date: 04/07/2022 Prepared by: Ethelene Browns  Exercises - Standing Hip Flexion with Resistance Loop  - 1 x daily - 7 x weekly - 1 sets - 10 reps - 2-3 secs hold - Hip  Abduction with Resistance Loop  - 1 x daily - 7 x weekly - 1 sets - 10 reps - 2-3 secs  hold - Hip Extension with Resistance Loop  - 1 x daily - 7 x weekly - 1 sets - 10 reps - Supine Pelvic Tilt  - 1 x daily - 7 x weekly - 1 sets - 10 reps - 5 sec hold  HOME EXERCISE PROGRAM: Medbridge [email or web address]  Access Code: [email or web address] URL: [email or web address] Date: 02/09/2022 Prepared by: Ethelene Browns  Exercises - Clamshell  - 1 x daily - 7 x weekly - 1-2 sets - 10 reps - 2-3 hold - Sidelying Hip Abduction  - 1 x daily - 7 x weekly - 1-2 sets - 10 reps - 2 hold  GOALS: Goals reviewed with patient? Yes  SHORT TERM GOALS: Target date: 03-12-22   PT Short Term Goals - 02/09/22 1945       PT SHORT TERM GOAL #1   Title Initiate aquatic therapy at [facility] pt will amb. from car to pool  area with RW with supervision with c/o Lt hip pain </= 5/10 after amb. this distance.    Time 3    Period Weeks    Status Ongoing - 03-09-22   Target Date 03/12/22      PT SHORT TERM GOAL #2   Title Pt will report decreased Lt hip pain to </= 4/10 with weight bearing.    Baseline pt reports Lt hip pain 7-8/10 with weight bearing    Time 3    Period Weeks    Status Ongoing - 03-09-22   Target Date 03/12/22      PT SHORT TERM GOAL #3   Title Pt will amb. 7' without device with SBA for increased household amb.    Baseline pt using SPC    Time 3    Period Weeks    Status Met - 03-09-22   Target Date 03/12/22      PT SHORT TERM GOAL #4   Title Independent in HEP for Lt hip musc. strengthening and ITB stretching.    Time 3    Period Weeks    Status Ongoing- 03-09-22 - pt has only had 1 land session since eval due to having had cytoscopy in early July   Target Date 03/12/22             LONG TERM GOALS: Target date: 04-09-22    PT Long Term Goals - 02/09/22 1952       PT LONG TERM GOAL #1   Title Independent in aquatic HEP to be continued at facility of pt's choice upon  discharge from PT.    Time 6    Period Weeks    Status Ongoing   Target Date 04-02-22     PT LONG TERM GOAL #2   Title Pt will report reduced back/hip pain to </= 2/10 with weight bearing.    Baseline 4-5/10 reported with weight bearing on 04-13-22   Time 6    Period Weeks    Status Partially met; standing is worse than walking   Target Date 04/02/22      PT LONG TERM GOAL #3   Title Pt will perform sit to stand transfer with bil. UE support from chair with c/o pain </2/10 in low back and Lt hip.    Baseline 4-5/10 on 02-09-22    Time 6    Period Weeks    Status Goal met 04-13-22   Target Date 04/02/22      PT LONG TERM GOAL #4   Title Improve TUG score by at least 3 secs without assistive device to demo improved functional mobility.    Baseline 12.8 secs without use of device on 03-09-22   Time 6    Period Weeks    Status Deferred as score is WNL's on 03-09-22   Target Date 04/02/22      PT LONG TERM GOAL #5   Title Pt will tolerate lying supine for at least 1 hour for increased comfort with sleeping.    Baseline < 30" - 02-09-22    Time 6    Period Weeks    Status Met 04-13-22   Target Date 04/02/22             NEW LTG's - target date 05-14-22  PT Long Term Goals - 04/14/22 1736       PT LONG TERM GOAL #1   Title Independent in aquatic HEP to be continued at facility of pt's choice upon discharge from PT.  Time 4    Period Weeks    Status  Ongoing   Target Date 05/14/22      PT LONG TERM GOAL #2   Title Pt will report reduced back/hip pain to </= 3/10 with weight bearing.    Baseline 4-5/10 reported with weight bearing    Time 4    Period Weeks    Status Revised    Target Date 05/14/22      PT LONG TERM GOAL #3   Title Pt will improve 5 times sit to stand score to </= 13 secs without UE support to demo improved strength in bil. LE's.    Baseline 15.34 secs without UE support    Time 4    Period Weeks    Status New    Target Date 05/14/22      PT LONG  TERM GOAL #4   Title Pt will improve RLE SLS to >/= 7 secs and LLE SLS to >/= 4 secs for improved balance with gait.    Baseline RLE 5.53 secs, LLE 2.69 secs    Time 4    Period Weeks    Status New    Target Date 05/14/22                 ASSESSMENT:  CLINICAL IMPRESSION: LTG's assessed in today's PT session; pt has met LTG's #3 & 5:  LTG #1 is ongoing as pt continues to participate in aquatic PT and aquatic exercises are being updated.  LTG #2 is partially met as pt reports continued back pain with prolonged standing and walking; pt reports no pain in Lt hip as Lt hip bursitis has now resolved. Pt reports no low back pain in seated position, only experienced with prolonged weight bearing.  LTG #4 deferred as TUG score was WNL's with score 12.8 secs. Pt able to amb. 230' without use of SPC on flat, even surface without c/o back pain with this distance, however, pt reporting "starting to feel it" after 230'.  Pt will benefit from continued PT to address gait and balance deficits and LE weakness.    OBJECTIVE IMPAIRMENTS decreased balance, difficulty walking, decreased strength, postural dysfunction, and pain.   ACTIVITY LIMITATIONS carrying, bending, standing, squatting, sleeping, stairs, and locomotion level  PARTICIPATION LIMITATIONS: meal prep, cleaning, laundry, shopping, community activity, and yard work  PERSONAL FACTORS Past/current experiences, Time since onset of injury/illness/exacerbation, and 1-2 comorbidities: low back pain  are also affecting patient's functional outcome.   REHAB POTENTIAL: Good  CLINICAL DECISION MAKING: Evolving/moderate complexity  EVALUATION COMPLEXITY: Moderate  PLAN: PT FREQUENCY: 2x/week  PT DURATION: 4 weeks - plan for 4 additional land visits, 4 aquatic visits  PLANNED INTERVENTIONS: Therapeutic exercises, Therapeutic activity, Neuromuscular re-education, Balance training, Gait training, Patient/Family education, Stair training, Aquatic  Therapy, Cryotherapy, and Manual therapy  PLAN FOR NEXT SESSION:  cont with LE strengthening and gait & balance training   Chivon Lepage, [place], PT 04/14/2022, 6:10 PM

## 2022-04-14 ENCOUNTER — Encounter: Payer: Self-pay | Admitting: Physical Therapy

## 2022-04-15 DIAGNOSIS — N3000 Acute cystitis without hematuria: Secondary | ICD-10-CM | POA: Diagnosis not present

## 2022-04-15 DIAGNOSIS — N281 Cyst of kidney, acquired: Secondary | ICD-10-CM | POA: Diagnosis not present

## 2022-04-15 DIAGNOSIS — N200 Calculus of kidney: Secondary | ICD-10-CM | POA: Diagnosis not present

## 2022-04-20 ENCOUNTER — Ambulatory Visit: Payer: Medicare Other | Attending: Family Medicine | Admitting: Physical Therapy

## 2022-04-20 DIAGNOSIS — R2689 Other abnormalities of gait and mobility: Secondary | ICD-10-CM | POA: Insufficient documentation

## 2022-04-20 DIAGNOSIS — R2681 Unsteadiness on feet: Secondary | ICD-10-CM | POA: Diagnosis not present

## 2022-04-20 DIAGNOSIS — M6281 Muscle weakness (generalized): Secondary | ICD-10-CM | POA: Diagnosis not present

## 2022-04-20 NOTE — Therapy (Signed)
OUTPATIENT PHYSICAL THERAPY NEURO TREATMENT NOTE   Patient Name: Brandon [name] MRN: [id number] DOB:[birth year], 76 y.o., male Today's Date: 04/21/2022   PCP: Sharilyn Sites, MD REFERRING PROVIDER: Florinda Marker    PT End of Session - 04/21/22 1856     Visit Number 14    Number of Visits 21    Date for PT Re-Evaluation 05/14/22   pushed out due to scheduling   Authorization Type Medicare    Authorization Time Period 02-08-22 - 05-09-22; 04-13-22 - 05-14-22    PT Start Time 0847    PT Stop Time 0930    PT Time Calculation (min) 43 min    Equipment Utilized During Treatment --   pool noodle, small bar bells, aquatic weight - 2.5#   Activity Tolerance Patient tolerated treatment well    Behavior During Therapy [facility] for tasks assessed/performed                  Past Medical History:  Diagnosis Date   Arthritis    Coronary atherosclerosis of native coronary artery    Multivessel status post CABG March 2015   DDD (degenerative disc disease), lumbar    Essential hypertension    GERD (gastroesophageal reflux disease)    Hard of hearing both ears    Left ear is worst   Hemorrhoids    History of gout    History of kidney stones    history of myasthenia Gravis 2021   History of [facility] spotted fever    Left ureteral  & renal stone    Lumbago    Mixed hyperlipidemia    Personal history of COVID-19 08/10/2021   Paxlovid   Plantar fascial fibromatosis    Postoperative atrial fibrillation (HCC)    Skin Cancer ([place])    Use of cane as ambulatory aid    Uses roller walker    UTI (urinary tract infection) 09/16/2021   Wears glasses    Past Surgical History:  Procedure Laterality Date   AMPUTATION Right 03/27/2014   Procedure: DEBRIDEMENT AND CLOSURE RIGHT INDEX FINGER;  Surgeon: [name], MD;  Location: [place];  Service: Orthopedics;  Laterality: Right;   BACK SURGERY     lower not sure when per wife on 10-01-2021   BICEPT TENODESIS  11/26/2011    Procedure: BICEPT TENODESIS;  Surgeon: Augustin Schooling, MD;  Location: [place];  Service: Orthopedics;  Laterality: Left;   BIOPSY  08/03/2018   Procedure: BIOPSY;  Surgeon: Rogene Houston, MD;  [facility];  Service: Endoscopy;;  antral   Cataract surgery Bilateral    yrs ago per wife on 10-01-2021   CHOLECYSTECTOMY N/A 09/22/2018   Procedure: LAPAROSCOPIC CHOLECYSTECTOMY;  Surgeon: Virl Cagey, MD;  [facility]: [facility];  Service: General;  Laterality: N/A;   COLONOSCOPY N/A 12/26/2014   Procedure: COLONOSCOPY;  Surgeon: Rogene Houston, MD;  [facility];  Service: Endoscopy;  Laterality: N/A;  730   CORONARY ARTERY BYPASS GRAFT N/A 10/19/2013   Procedure: CORONARY ARTERY BYPASS GRAFTING (CABG);  Surgeon: Rexene Alberts, MD;  Location: [place];  Service: Open Heart Surgery;  Laterality: N/A;  CABG times four using left internal mammary artery and left leg saphenous vein, incision made on right leg but no vein removed   CYSTOSCOPY W/ URETERAL STENT PLACEMENT Right 02/22/2022   Procedure: CYSTOSCOPY WITH RETROGRADE PYELOGRAM/URETERAL STENT PLACEMENT;  Surgeon: Ceasar Mons, MD;  [facility];  Service: Urology;  Laterality:  Right;   CYSTOSCOPY WITH RETROGRADE PYELOGRAM, URETEROSCOPY AND STENT PLACEMENT Left 08/24/2016   Procedure: CYSTOSCOPY WITH LEFT RETROGRADE PYELOGRAM, LEFT URETEROSCOPY, BASKET EXTRACTION LEFT URETERAL STONE;  Surgeon: Irine Seal, MD;  [facility];  Service: Urology;  Laterality: Left;   CYSTOSCOPY/URETEROSCOPY/HOLMIUM LASER/STENT PLACEMENT Left 10/06/2021   Procedure: CYSTOSCOPY LEFT RETROGRADE PYELOGRAM URETEROSCOPY/HOLMIUM LASER/STENT PLACEMENT;  Surgeon: Irine Seal, MD;  [facility];  Service: Urology;  Laterality: Left;   CYSTOSCOPY/URETEROSCOPY/HOLMIUM LASER/STENT PLACEMENT Right 03/01/2022   Procedure: CYSTOSCOPY RIGHT URETEROSCOPY/HOLMIUM LASER/STENT PLACEMENT;  Surgeon: Irine Seal, MD;  [facility];  Service: Urology;  Laterality: Right;   ESOPHAGOGASTRODUODENOSCOPY N/A 08/03/2018   Procedure: ESOPHAGOGASTRODUODENOSCOPY (EGD);  Surgeon: Rogene Houston, MD;  [facility];  Service: Endoscopy;  Laterality: N/A;  2:55   EXTRACORPOREAL SHOCK WAVE LITHOTRIPSY Right 06/18/2019   Procedure: EXTRACORPOREAL SHOCK WAVE LITHOTRIPSY (ESWL);  Surgeon: Lucas Mallow, MD;  [facility];  Service: Urology;  Laterality: Right;   INTRAOPERATIVE TRANSESOPHAGEAL ECHOCARDIOGRAM N/A 10/19/2013   Procedure: INTRAOPERATIVE TRANSESOPHAGEAL ECHOCARDIOGRAM;  Surgeon: Rexene Alberts, MD;  Location: [place];  Service: Open Heart Surgery;  Laterality: N/A;   KYPHOPLASTY N/A 03/27/2021   Procedure: Lumbar One and Lumbar Two Kyphoplasty;  Surgeon: Erline Levine, MD;  Location: [place];  Service: Neurosurgery;  Laterality: N/A;   left hand carpal tunnel release  04/04/2015   LEFT HEART CATHETERIZATION WITH CORONARY ANGIOGRAM N/A 10/09/2013   Procedure: LEFT HEART CATHETERIZATION WITH CORONARY ANGIOGRAM;  Surgeon: Burnell Blanks, MD;  Location: [facility] C[facility];  Service: Cardiovascular;  Laterality: N/A;   Left shoulder rotator cuff repair  12/04/2011   MOHS procedure  01/06/2021   dr Harl Bowie on left hand   ORIF PERIPROSTHETIC FRACTURE Left 03/19/2020   Procedure: OPEN REDUCTION INTERNAL FIXATION (ORIF) PERIPROSTHETIC FRACTURE LEFT FEMUR WITH FEMORAL REVISION;  Surgeon: Gaynelle Arabian, MD;  [facility];  Service: Orthopedics;  Laterality: Left;   TOTAL HIP ARTHROPLASTY Left 01/28/2020   Procedure: TOTAL HIP ARTHROPLASTY ANTERIOR APPROACH;  Surgeon: Gaynelle Arabian, MD;  [facility];  Service: Orthopedics;  Laterality: Left;  165mn   TOTAL KNEE ARTHROPLASTY Left 04/18/2001   traumaticvamputation of finger  2015   VASECTOMY     yrs ago per wife on 10-01-2021   Patient Active Problem List   Diagnosis Date Noted   Fracture, lumbar vertebra, compression ([facility] 03/27/2021   Atrial  fibrillation with RVR ([facility]    Periprosthetic fracture around internal prosthetic left hip joint ([facility] 03/19/2020   OA (osteoarthritis) of hip 01/28/2020   Closed displaced fracture of left femoral neck ([facility] 01/28/2020   History of colonic polyps 11/12/2019   GERD (gastroesophageal reflux disease) 11/06/2019   Myasthenia gravis with (acute) exacerbation ([facility] 11/06/2019   Acute diarrhea 09/17/2019   Heme positive stool 09/17/2019   Unspecified osteoarthritis, unspecified site    Hyperlipidemia, unspecified    Personal history of other diseases of the musculoskeletal system and connective tissue    Atherosclerotic heart disease of native coronary artery without angina pectoris    Hypertensive heart disease without heart failure    Melena    Plantar fascial fibromatosis    Pleurodynia    Calculus of gallbladder without cholecystitis without obstruction 09/14/2018   Nausea without vomiting 06/05/2018   Postoperative atrial fibrillation ([facility] 11/09/2013   S/P CABG x 4 10/19/2013   Coronary atherosclerosis of native coronary artery 10/18/2013   Essential hypertension, benign 09/18/2013   Family history of hypertrophic  cardiomyopathy 09/18/2013   Mixed hyperlipidemia     ONSET DATE: 12-29-21 (date of L2-3 discectomy surgery)  REFERRING DIAG: N47.096 (ICD-10-CM) - Presence of left artificial hip joint ; Lt trochanteric bursitis  THERAPY DIAG:  Muscle weakness (generalized)  Unsteadiness on feet  Other abnormalities of gait and mobility  Rationale for Evaluation and Treatment Rehabilitation  SUBJECTIVE:                                                                                                                                                                                              SUBJECTIVE STATEMENT:     Pt reports he is doing rather well - has some back pain when he walks or stands for long periods of time, but says he realizes he will probably always have some back  pain but "it's not what it was"     Pt accompanied by:  wife, [name]  PERTINENT HISTORY: Mr. Allinson is a 76 year old male with PMH of Myasthenia Gravis, osteoporosis, CAD who presented to [facility] on 12/11/21 for initial neurosurgical evaluation of low back pain referred by Dr. Sharilyn Sites. He has a history of an L3-S1 fusion with Dr. Glenna Fellows in 2016 (retired) as well as L1/L2 kyphoplasty on 03/27/21 with Dr. Erline Levine.  He reported good relief with lumbar fusion until the last 3 years.  He was diagnosed with myasthenia gravis in 2021 and was placed on steroids.  He feels that this contributed to to his osteoporosis.  He reported low back pain that radiated to his left buttock and groin.  Pain was sharp rated as 10 out of 10 at its worst.  Pain was worse with lying, standing or walking.  He was taking hydrocodone prescribed by his PCP which does not really help.  He reported lower extremity weakness and lower extremity aching.  He had tried ESI with minimal relief.  He has a history of a left hip replacement. He is s/p left L2-3 MED on 12/29/21 with Dr. Linus Mako. Wilson. Pt also has Myasthenia Gravis - diagnosed in Jan. 2021   PAIN:  Are you having pain? No pain reported in sitting position Pt states he will have low back pain if he stands or walks for prolonged time period - rates intensity varying from 4-5/10 depending on duration of weight bearing   PRECAUTIONS: None  WEIGHT BEARING RESTRICTIONS No  FALLS: Has patient fallen in last 6 months? No  LIVING ENVIRONMENT: Lives with: lives with their family Lives in: House/apartment Stairs: Yes: External: 3 steps; on right going up Has following equipment at home: Single point  cane and Walker - 2 wheeled  PLOF: Independent  PATIENT GOALS decrease Lt hip and low back pain   OBJECTIVE:   04-20-22:   Exercises for bil. Hip and low back strengthening:   TherEx:   Exercises:  3 times sit to stand from mat table - no UE  support  Step ups forward onto 6" step LLE 10 reps; RLE 10 reps onto 6" step  Leg press - 70# 4 sets 10 reps bil. LE's  SciFit level 3.0 x 6" with UE's and LE's for LE strengthening and endurance training  Standing hip strengthening exercises with red theraband - forward, back and side kicks 10 reps each direction, each leg   NeuroRe-ed:  Rockerboard - 10 reps 2 sets with UE support prn on // bars Stepping down to floor alternating feet with minimal UE support 10 reps each leg for improved SLS on each leg Standing on rockerboard - tapping 2 cones 3 reps straight ahead while standing on rockerboard; diagonal taps 3 reps with minimal UE support on // bars  Stepping over and back of bolster 10 reps each leg - placed inside // bars but no UE support used on // bars  PATIENT EDUCATION:   Revised HEP on 04-06-22 to include pelvic tilt and resisted hip strengthening with red theraband;  Medbridge (see below) Access Code: [email or web address] URL: [email or web address] Date: 04/07/2022 Prepared by: Ethelene Browns  Exercises - Standing Hip Flexion with Resistance Loop  - 1 x daily - 7 x weekly - 1 sets - 10 reps - 2-3 secs hold - Hip Abduction with Resistance Loop  - 1 x daily - 7 x weekly - 1 sets - 10 reps - 2-3 secs  hold - Hip Extension with Resistance Loop  - 1 x daily - 7 x weekly - 1 sets - 10 reps - Supine Pelvic Tilt  - 1 x daily - 7 x weekly - 1 sets - 10 reps - 5 sec hold    Education details: Medbridge HEP [email or web address] - see above - issued on 03-09-22 Person educated: Patient and Spouse Education method: Explanation and Demonstration, handout Education comprehension: verbalized understanding Access Code: [email or web address] URL: [email or web address] Date: 04/07/2022 Prepared by: Ethelene Browns  Exercises - Standing Hip Flexion with Resistance Loop  - 1 x daily - 7 x weekly - 1 sets - 10 reps - 2-3 secs hold - Hip Abduction with Resistance Loop  - 1 x daily - 7 x weekly - 1  sets - 10 reps - 2-3 secs  hold - Hip Extension with Resistance Loop  - 1 x daily - 7 x weekly - 1 sets - 10 reps - Supine Pelvic Tilt  - 1 x daily - 7 x weekly - 1 sets - 10 reps - 5 sec hold  HOME EXERCISE PROGRAM: Medbridge [email or web address]  Access Code: [email or web address] URL: [email or web address] Date: 02/09/2022 Prepared by: Ethelene Browns  Exercises - Clamshell  - 1 x daily - 7 x weekly - 1-2 sets - 10 reps - 2-3 hold - Sidelying Hip Abduction  - 1 x daily - 7 x weekly - 1-2 sets - 10 reps - 2 hold  GOALS: Goals reviewed with patient? Yes  SHORT TERM GOALS: Target date: 03-12-22   PT Short Term Goals - 02/09/22 1945       PT SHORT TERM GOAL #1   Title Initiate aquatic therapy at [facility] pt will amb. from car to pool area with RW with supervision with c/o Lt hip  pain </= 5/10 after amb. this distance.    Time 3    Period Weeks    Status Ongoing - 03-09-22   Target Date 03/12/22      PT SHORT TERM GOAL #2   Title Pt will report decreased Lt hip pain to </= 4/10 with weight bearing.    Baseline pt reports Lt hip pain 7-8/10 with weight bearing    Time 3    Period Weeks    Status Ongoing - 03-09-22   Target Date 03/12/22      PT SHORT TERM GOAL #3   Title Pt will amb. 58' without device with SBA for increased household amb.    Baseline pt using SPC    Time 3    Period Weeks    Status Met - 03-09-22   Target Date 03/12/22      PT SHORT TERM GOAL #4   Title Independent in HEP for Lt hip musc. strengthening and ITB stretching.    Time 3    Period Weeks    Status Ongoing- 03-09-22 - pt has only had 1 land session since eval due to having had cytoscopy in early July   Target Date 03/12/22             LONG TERM GOALS: Target date: 04-09-22    PT Long Term Goals - 02/09/22 1952       PT LONG TERM GOAL #1   Title Independent in aquatic HEP to be continued at facility of pt's choice upon discharge from PT.    Time 6    Period Weeks    Status  Ongoing   Target Date 04-02-22     PT LONG TERM GOAL #2   Title Pt will report reduced back/hip pain to </= 2/10 with weight bearing.    Baseline 4-5/10 reported with weight bearing on 04-13-22   Time 6    Period Weeks    Status Partially met; standing is worse than walking   Target Date 04/02/22      PT LONG TERM GOAL #3   Title Pt will perform sit to stand transfer with bil. UE support from chair with c/o pain </2/10 in low back and Lt hip.    Baseline 4-5/10 on 02-09-22    Time 6    Period Weeks    Status Goal met 04-13-22   Target Date 04/02/22      PT LONG TERM GOAL #4   Title Improve TUG score by at least 3 secs without assistive device to demo improved functional mobility.    Baseline 12.8 secs without use of device on 03-09-22   Time 6    Period Weeks    Status Deferred as score is WNL's on 03-09-22   Target Date 04/02/22      PT LONG TERM GOAL #5   Title Pt will tolerate lying supine for at least 1 hour for increased comfort with sleeping.    Baseline < 30" - 02-09-22    Time 6    Period Weeks    Status Met 04-13-22   Target Date 04/02/22             NEW LTG's - target date 05-14-22  PT Long Term Goals - 04/14/22 1736       PT LONG TERM GOAL #1   Title Independent in aquatic HEP to be continued at facility of pt's choice upon discharge from PT.    Time 4    Period Weeks  Status  Ongoing   Target Date 05/14/22      PT LONG TERM GOAL #2   Title Pt will report reduced back/hip pain to </= 3/10 with weight bearing.    Baseline 4-5/10 reported with weight bearing    Time 4    Period Weeks    Status Revised    Target Date 05/14/22      PT LONG TERM GOAL #3   Title Pt will improve 5 times sit to stand score to </= 13 secs without UE support to demo improved strength in bil. LE's.    Baseline 15.34 secs without UE support    Time 4    Period Weeks    Status New    Target Date 05/14/22      PT LONG TERM GOAL #4   Title Pt will improve RLE SLS to >/= 7  secs and LLE SLS to >/= 4 secs for improved balance with gait.    Baseline RLE 5.53 secs, LLE 2.69 secs    Time 4    Period Weeks    Status New    Target Date 05/14/22                 ASSESSMENT:  CLINICAL IMPRESSION: PT session focused on LE strengthening and SLS balance exercises.  Pt able to amb. Short distances (</= 200') without cane independently and safely.  Pt is progressing well towards LTG's.    OBJECTIVE IMPAIRMENTS decreased balance, difficulty walking, decreased strength, postural dysfunction, and pain.   ACTIVITY LIMITATIONS carrying, bending, standing, squatting, sleeping, stairs, and locomotion level  PARTICIPATION LIMITATIONS: meal prep, cleaning, laundry, shopping, community activity, and yard work  PERSONAL FACTORS Past/current experiences, Time since onset of injury/illness/exacerbation, and 1-2 comorbidities: low back pain  are also affecting patient's functional outcome.   REHAB POTENTIAL: Good  CLINICAL DECISION MAKING: Evolving/moderate complexity  EVALUATION COMPLEXITY: Moderate  PLAN: PT FREQUENCY: 2x/week  PT DURATION: 4 weeks - plan for 4 additional land visits, 4 aquatic visits  PLANNED INTERVENTIONS: Therapeutic exercises, Therapeutic activity, Neuromuscular re-education, Balance training, Gait training, Patient/Family education, Stair training, Aquatic Therapy, Cryotherapy, and Manual therapy  PLAN FOR NEXT SESSION:  cont with LE strengthening and gait & balance training   Natarsha Hurwitz, [place], PT 04/21/2022, 6:58 PM

## 2022-04-21 ENCOUNTER — Encounter: Payer: Self-pay | Admitting: Physical Therapy

## 2022-04-21 ENCOUNTER — Telehealth: Payer: Self-pay | Admitting: *Deleted

## 2022-04-21 ENCOUNTER — Encounter: Payer: Self-pay | Admitting: *Deleted

## 2022-04-21 NOTE — Patient Outreach (Signed)
  Care Coordination   Initial Visit Note   04/21/2022 Name: Brandon [name] MRN: [id number] DOB: Feb 21, 1946  Brandon [name] is a 76 y.o. year old male who sees Sharilyn Sites, MD for primary care. I  spoke with wife, Brandon [name], by telephone.  What matters to the patients health and wellness today?  Ongoing self management of chronic medical conditions   Goals Addressed             This Visit's Progress    COMPLETED: Care Coordination Services (no follo-up needed)       Care Coordination Interventions: Reviewed medications with patient and discussed affordability Assessed social determinant of health barriers Assessed mobility and ability to perform ADLs Assessed family/Social support Provided patient/caregiver with verbal information on [place] [phone]) Encouraged patient to request a referral for [place] from PCP if services are needed in the future        SDOH assessments and interventions completed:  Yes  SDOH Interventions Today    Flowsheet Row Most Recent Value  SDOH Interventions   Housing Interventions Intervention Not Indicated  Transportation Interventions Intervention Not Indicated  Utilities Interventions Intervention Not Indicated  Financial Strain Interventions Intervention Not Indicated        Care Coordination Interventions Activated:  Yes  Care Coordination Interventions:  Yes, provided   Follow up plan: No further intervention required.   Encounter Outcome:  Pt. Visit Completed   Chong Sicilian, BSN, RN-BC [place] / [facility] Pharmacist, community Dial: [phone]

## 2022-04-26 ENCOUNTER — Ambulatory Visit: Payer: Medicare Other | Admitting: Physical Therapy

## 2022-04-26 ENCOUNTER — Encounter: Payer: Self-pay | Admitting: Physical Therapy

## 2022-04-26 DIAGNOSIS — M6281 Muscle weakness (generalized): Secondary | ICD-10-CM

## 2022-04-26 DIAGNOSIS — R2681 Unsteadiness on feet: Secondary | ICD-10-CM | POA: Diagnosis not present

## 2022-04-26 DIAGNOSIS — R2689 Other abnormalities of gait and mobility: Secondary | ICD-10-CM

## 2022-04-26 NOTE — Therapy (Signed)
OUTPATIENT PHYSICAL THERAPY TREATMENT NOTE   Patient Name: Brandon [name] MRN: [id number] DOB:1946/07/12, 76 y.o., male Today's Date: 04/26/2022   PCP: Sharilyn Sites, MD REFERRING PROVIDER: Florinda Marker    PT End of Session - 04/26/22 1948     Visit Number 15    Number of Visits 21    Date for PT Re-Evaluation 05/14/22   pushed out due to scheduling   Authorization Type Medicare    Authorization Time Period 02-08-22 - 05-09-22; 04-13-22 - 05-14-22    PT Start Time 1315    PT Stop Time 1400    PT Time Calculation (min) 45 min    Equipment Utilized During Treatment Other (comment)   pool noodle, small bar bells, aquatic weight - 2.5#   Activity Tolerance Patient tolerated treatment well    Behavior During Therapy [facility] for tasks assessed/performed              Past Medical History:  Diagnosis Date   Arthritis    Coronary atherosclerosis of native coronary artery    Multivessel status post CABG March 2015   DDD (degenerative disc disease), lumbar    Essential hypertension    GERD (gastroesophageal reflux disease)    Hard of hearing both ears    Left ear is worst   Hemorrhoids    History of gout    History of kidney stones    history of myasthenia Gravis 2021   History of [facility] spotted fever    Left ureteral  & renal stone    Lumbago    Mixed hyperlipidemia    Personal history of COVID-19 08/10/2021   Paxlovid   Plantar fascial fibromatosis    Postoperative atrial fibrillation (HCC)    Skin Cancer ([place])    Use of cane as ambulatory aid    Uses roller walker    UTI (urinary tract infection) 09/16/2021   Wears glasses    Past Surgical History:  Procedure Laterality Date   AMPUTATION Right 03/27/2014   Procedure: DEBRIDEMENT AND CLOSURE RIGHT INDEX FINGER;  Surgeon: [name], MD;  Location: [place];  Service: Orthopedics;  Laterality: Right;   BACK SURGERY     lower not sure when per wife on 10-01-2021   BICEPT TENODESIS  11/26/2011    Procedure: BICEPT TENODESIS;  Surgeon: Augustin Schooling, MD;  Location: [place];  Service: Orthopedics;  Laterality: Left;   BIOPSY  08/03/2018   Procedure: BIOPSY;  Surgeon: Rogene Houston, MD;  [facility];  Service: Endoscopy;;  antral   Cataract surgery Bilateral    yrs ago per wife on 10-01-2021   CHOLECYSTECTOMY N/A 09/22/2018   Procedure: LAPAROSCOPIC CHOLECYSTECTOMY;  Surgeon: Virl Cagey, MD;  [facility]: [facility];  Service: General;  Laterality: N/A;   COLONOSCOPY N/A 12/26/2014   Procedure: COLONOSCOPY;  Surgeon: Rogene Houston, MD;  [facility];  Service: Endoscopy;  Laterality: N/A;  730   CORONARY ARTERY BYPASS GRAFT N/A 10/19/2013   Procedure: CORONARY ARTERY BYPASS GRAFTING (CABG);  Surgeon: Rexene Alberts, MD;  Location: [place];  Service: Open Heart Surgery;  Laterality: N/A;  CABG times four using left internal mammary artery and left leg saphenous vein, incision made on right leg but no vein removed   CYSTOSCOPY W/ URETERAL STENT PLACEMENT Right 02/22/2022   Procedure: CYSTOSCOPY WITH RETROGRADE PYELOGRAM/URETERAL STENT PLACEMENT;  Surgeon: Ceasar Mons, MD;  [facility];  Service: Urology;  Laterality: Right;   CYSTOSCOPY  WITH RETROGRADE PYELOGRAM, URETEROSCOPY AND STENT PLACEMENT Left 08/24/2016   Procedure: CYSTOSCOPY WITH LEFT RETROGRADE PYELOGRAM, LEFT URETEROSCOPY, BASKET EXTRACTION LEFT URETERAL STONE;  Surgeon: Irine Seal, MD;  [facility];  Service: Urology;  Laterality: Left;   CYSTOSCOPY/URETEROSCOPY/HOLMIUM LASER/STENT PLACEMENT Left 10/06/2021   Procedure: CYSTOSCOPY LEFT RETROGRADE PYELOGRAM URETEROSCOPY/HOLMIUM LASER/STENT PLACEMENT;  Surgeon: Irine Seal, MD;  Location[facility];  Service: Urology;  Laterality: Left;   CYSTOSCOPY/URETEROSCOPY/HOLMIUM LASER/STENT PLACEMENT Right 03/01/2022   Procedure: CYSTOSCOPY RIGHT URETEROSCOPY/HOLMIUM LASER/STENT PLACEMENT;  Surgeon: Irine Seal, MD;  [facility];  Service: Urology;  Laterality: Right;   ESOPHAGOGASTRODUODENOSCOPY N/A 08/03/2018   Procedure: ESOPHAGOGASTRODUODENOSCOPY (EGD);  Surgeon: Rogene Houston, MD;  [facility];  Service: Endoscopy;  Laterality: N/A;  2:55   EXTRACORPOREAL SHOCK WAVE LITHOTRIPSY Right 06/18/2019   Procedure: EXTRACORPOREAL SHOCK WAVE LITHOTRIPSY (ESWL);  Surgeon: Lucas Mallow, MD;  [facility];  Service: Urology;  Laterality: Right;   INTRAOPERATIVE TRANSESOPHAGEAL ECHOCARDIOGRAM N/A 10/19/2013   Procedure: INTRAOPERATIVE TRANSESOPHAGEAL ECHOCARDIOGRAM;  Surgeon: Rexene Alberts, MD;  Location: [place];  Service: Open Heart Surgery;  Laterality: N/A;   KYPHOPLASTY N/A 03/27/2021   Procedure: Lumbar One and Lumbar Two Kyphoplasty;  Surgeon: Erline Levine, MD;  Location: [place];  Service: Neurosurgery;  Laterality: N/A;   left hand carpal tunnel release  04/04/2015   LEFT HEART CATHETERIZATION WITH CORONARY ANGIOGRAM N/A 10/09/2013   Procedure: LEFT HEART CATHETERIZATION WITH CORONARY ANGIOGRAM;  Surgeon: Burnell Blanks, MD;  Location[facility];  Service: Cardiovascular;  Laterality: N/A;   Left shoulder rotator cuff repair  12/04/2011   MOHS procedure  01/06/2021   dr Harl Bowie on left hand   ORIF PERIPROSTHETIC FRACTURE Left 03/19/2020   Procedure: OPEN REDUCTION INTERNAL FIXATION (ORIF) PERIPROSTHETIC FRACTURE LEFT FEMUR WITH FEMORAL REVISION;  Surgeon: Gaynelle Arabian, MD;  [facility];  Service: Orthopedics;  Laterality: Left;   TOTAL HIP ARTHROPLASTY Left 01/28/2020   Procedure: TOTAL HIP ARTHROPLASTY ANTERIOR APPROACH;  Surgeon: Gaynelle Arabian, MD;  [facility];  Service: Orthopedics;  Laterality: Left;  19mn   TOTAL KNEE ARTHROPLASTY Left 04/18/2001   traumaticvamputation of finger  2015   VASECTOMY     yrs ago per wife on 10-01-2021   Patient Active Problem List   Diagnosis Date Noted   Fracture, lumbar vertebra, compression ([place] 03/27/2021   Atrial  fibrillation with RVR ([place]    Periprosthetic fracture around internal prosthetic left hip joint ([place] 03/19/2020   OA (osteoarthritis) of hip 01/28/2020   Closed displaced fracture of left femoral neck ([facility] 01/28/2020   History of colonic polyps 11/12/2019   GERD (gastroesophageal reflux disease) 11/06/2019   Myasthenia gravis with (acute) exacerbation ([place] 11/06/2019   Acute diarrhea 09/17/2019   Heme positive stool 09/17/2019   Unspecified osteoarthritis, unspecified site    Hyperlipidemia, unspecified    Personal history of other diseases of the musculoskeletal system and connective tissue    Atherosclerotic heart disease of native coronary artery without angina pectoris    Hypertensive heart disease without heart failure    Melena    Plantar fascial fibromatosis    Pleurodynia    Calculus of gallbladder without cholecystitis without obstruction 09/14/2018   Nausea without vomiting 06/05/2018   Postoperative atrial fibrillation ([facility] 11/09/2013   S/P CABG x 4 10/19/2013   Coronary atherosclerosis of native coronary artery 10/18/2013   Essential hypertension, benign 09/18/2013   Family history of hypertrophic cardiomyopathy 09/18/2013  Mixed hyperlipidemia     ONSET DATE: 12-29-21 (date of L2-3 MED surgery)  REFERRING DIAG: F02.774 (ICD-10-CM) - Presence of left artificial hip joint ; Lt trochanteric bursitis  THERAPY DIAG:  Muscle weakness (generalized)  Unsteadiness on feet  Other abnormalities of gait and mobility  Rationale for Evaluation and Treatment Rehabilitation  SUBJECTIVE:                                                                                                                                                                                              SUBJECTIVE STATEMENT: Pt reports he feels pretty good today - no problems or changes reported   Pt accompanied by:  wife, [name]  PERTINENT HISTORY: Mr. Maahs is a 76 year old male with PMH of  Myasthenia Gravis, osteoporosis, CAD who presented to [facility] on 12/11/21 for initial neurosurgical evaluation of low back pain referred by Dr. Sharilyn Sites. He has a history of an L3-S1 fusion with Dr. Glenna Fellows in 2016 (retired) as well as L1/L2 kyphoplasty on 03/27/21 with Dr. Erline Levine.  He reported good relief with lumbar fusion until the last 3 years.  He was diagnosed with myasthenia gravis in 2021 and was placed on steroids.  He feels that this contributed to to his osteoporosis.  He reported low back pain that radiated to his left buttock and groin.  Pain was sharp rated as 10 out of 10 at its worst.  Pain was worse with lying, standing or walking.  He was taking hydrocodone prescribed by his PCP which does not really help.  He reported lower extremity weakness and lower extremity aching.  He had tried ESI with minimal relief.  He has a history of a left hip replacement. He is s/p left L2-3 MED on 12/29/21 with Dr. Linus Mako. [place]. Pt also has Myasthenia Gravis - diagnosed in Jan. 2021   PAIN:  Are you having pain? Yes low back and Lt hip Low back 2-3/10 with weight bearing; no pain in sitting;  No hip pain with weight bearing; no pain in sitting  Sitting helps the back pain; walking helps the hip pain - trying to walk 10'-15' 2-3 times a day Aggravating factors - weight bearing for back; pressure aggravates the hip  PRECAUTIONS: None  WEIGHT BEARING RESTRICTIONS No  FALLS: Has patient fallen in last 6 months? No  LIVING ENVIRONMENT: Lives with: lives with their family Lives in: House/apartment Stairs: Yes: External: 3 steps; on right going up Has following equipment at home: Single point cane and Walker - 2 wheeled  PLOF: Independent  PATIENT GOALS decrease Lt hip  and low back pain   OBJECTIVE: 04-26-22  Aquatic therapy at [facility] - pool temp. 92 degrees  Patient seen for aquatic therapy today.  Treatment took place in water 3.5-4.5 feet deep depending upon  activity.  Pt entered and exited the pool via step negotiation with use of hand rails modified independently.  Pt performed water walking forwards 18' x 6 reps, backwards 18' x 4 reps and sideways 18' x 4 reps each for warm up  Pt performed runner's stretch - for hamstring/gastroc stretch - bil. LE's 30 sec hold x 1 rep each leg;   Gastroc stretch bil. LE's with toes/forefeet on pool wall 30 sec hold x 1 rep   Marching in place 10 reps without UE support; 2nd set - marching in place - holding bar bell - contralateral movement of UE/LE  with 1-2 sec hold to facilitate improved SLS on each leg - 18' x 4 reps forward,18' x 2 reps backwards   Pt performed squats x 10 reps with bil. UE support on pool edge; unilateral squats each leg 10 reps with bil. UE support on pool wall  Standing hip abduction with use of 2.5# aquatic weight 10 reps bil. LE's; pt performed hip flexion, extension and marching 10 reps each leg with UE support with use of aquatic weight 2.5# each leg;  pt performed hip flexion/extension with flexed knee on each leg with use of 2.5# aquatic weight for strengthening and for improved SLS on each leg  Pt performed crossovers front 18' x 2 reps, stepping behind 18' x 2 reps; braiding 18' x 4 reps with UE support on yellow noodle for support with all these exercises for assist with balance  Sidestepping with squats with small bar bells for UE resistance and for increased core stabilization with exercise - 18' x 4 reps  Trunk rotation with wide BOS with use of yellow noodle 10 reps to each side for increased trunk and lumbar ROM 10 reps to each side   Pt performed closed chain strengthening exercise for hip extensor strengthening - pushing yellow noodle down toward floor 2 sets 10 reps each leg; then knee flexion/extension 10 reps each leg with foot on yellow noodle for resistance  Ai Chi posture "Balancing" 10 reps each side with UE support on edge of pool prn for assist with balance    Pt requires buoyancy of water for support with standing balance and with gait training without UE support; buoyancy of water also needed for joint unloading for pain reduction with weight bearing; buoyancy also provides spinal decompression for reduced back pain in standing;  viscosity of water needed for resistance for strengthening bil. LE's:  water current provides perturbations for challenges with balance that cannot be safely performed on land          PATIENT EDUCATION: Education details: [facility] HEP [email or web address] - see above - issued on 03-09-22 Person educated: Patient and Spouse Education method: Customer service manager, handout Education comprehension: verbalized understanding   HOME EXERCISE PROGRAM: [facility] [email or web address]  Access Code: [email or web address] URL: [email or web address] Date: 02/09/2022 Prepared by: Ethelene Browns  Exercises - Clamshell  - 1 x daily - 7 x weekly - 1-2 sets - 10 reps - 2-3 hold - Sidelying Hip Abduction  - 1 x daily - 7 x weekly - 1-2 sets - 10 reps - 2 hold  GOALS: Goals reviewed with patient? Yes  SHORT TERM GOALS: Target date: 03-12-22   PT Short Term Goals - 02/09/22 1945  PT SHORT TERM GOAL #1   Title Initiate aquatic therapy at [facility].; pt will amb. from car to pool area with RW with supervision with c/o Lt hip pain </= 5/10 after amb. this distance.    Time 3    Period Weeks    Status Ongoing - 03-09-22   Target Date 03/12/22      PT SHORT TERM GOAL #2   Title Pt will report decreased Lt hip pain to </= 4/10 with weight bearing.    Baseline pt reports Lt hip pain 7-8/10 with weight bearing    Time 3    Period Weeks    Status Ongoing - 03-09-22   Target Date 03/12/22      PT SHORT TERM GOAL #3   Title Pt will amb. 49' without device with SBA for increased household amb.    Baseline pt using SPC    Time 3    Period Weeks    Status Met - 03-09-22   Target Date 03/12/22      PT SHORT TERM GOAL  #4   Title Independent in HEP for Lt hip musc. strengthening and ITB stretching.    Time 3    Period Weeks    Status Ongoing - pt has only had 1 land session since eval due to having had cytoscopy in early July   Target Date 03/12/22                    NEW LTG's - target date 05-14-22  PT Long Term Goals - 04/14/22 1736       PT LONG TERM GOAL #1   Title Independent in aquatic HEP to be continued at facility of pt's choice upon discharge from PT.    Time 4    Period Weeks    Status  Ongoing   Target Date 05/14/22      PT LONG TERM GOAL #2   Title Pt will report reduced back/hip pain to </= 3/10 with weight bearing.    Baseline 4-5/10 reported with weight bearing    Time 4    Period Weeks    Status Revised    Target Date 05/14/22      PT LONG TERM GOAL #3   Title Pt will improve 5 times sit to stand score to </= 13 secs without UE support to demo improved strength in bil. LE's.    Baseline 15.34 secs without UE support    Time 4    Period Weeks    Status New    Target Date 05/14/22      PT LONG TERM GOAL #4   Title Pt will improve RLE SLS to >/= 7 secs and LLE SLS to >/= 4 secs for improved balance with gait.    Baseline RLE 5.53 secs, LLE 2.69 secs    Time 4    Period Weeks    Status New    Target Date 05/14/22         ASSESSMENT:  CLINICAL IMPRESSION: Pt is progressing well with balance exercises in aquatic environment.  Pt able to independently recover LOB with SLS activities in 4' water depth; able to perform crossovers and braiding exercise with UE support on yellow noodle without needing assist for stabilization of noodle from PT.  Pt reported no pain with any exercises in the pool.  Cont with POC.       OBJECTIVE IMPAIRMENTS decreased balance, difficulty walking, decreased strength, postural dysfunction, and pain.  ACTIVITY LIMITATIONS carrying, bending, standing, squatting, sleeping, stairs, and locomotion level  PARTICIPATION LIMITATIONS:  meal prep, cleaning, laundry, shopping, community activity, and yard work  PERSONAL FACTORS Past/current experiences, Time since onset of injury/illness/exacerbation, and 1-2 comorbidities: low back pain  are also affecting patient's functional outcome.   REHAB POTENTIAL: Good  CLINICAL DECISION MAKING: Evolving/moderate complexity  EVALUATION COMPLEXITY: Moderate  PLAN: PT FREQUENCY: 2x/week  PT DURATION: 6 weeks - plan for 6 land visits, 6 aquatic visits  PLANNED INTERVENTIONS: Therapeutic exercises, Therapeutic activity, Neuromuscular re-education, Balance training, Gait training, Patient/Family education, Stair training, Aquatic Therapy, Cryotherapy, and Manual therapy  PLAN FOR NEXT SESSION:  cont with core strengthening, LLE strengthening    Bayler Nehring, [place], PT 04/26/2022, 7:51 PM

## 2022-04-29 ENCOUNTER — Ambulatory Visit: Payer: Medicare Other | Admitting: Physical Therapy

## 2022-04-29 DIAGNOSIS — R2689 Other abnormalities of gait and mobility: Secondary | ICD-10-CM

## 2022-04-29 DIAGNOSIS — R2681 Unsteadiness on feet: Secondary | ICD-10-CM | POA: Diagnosis not present

## 2022-04-29 DIAGNOSIS — M6281 Muscle weakness (generalized): Secondary | ICD-10-CM | POA: Diagnosis not present

## 2022-04-29 NOTE — Therapy (Unsigned)
OUTPATIENT PHYSICAL THERAPY NEURO TREATMENT NOTE   Patient Name: Brandon [name] MRN: [id number] DOB:09-14-1945, 76 y.o., male Today's Date: 04/30/2022   PCP: Sharilyn Sites, MD REFERRING PROVIDER: Florinda Marker    PT End of Session - 04/30/22 1552     Visit Number 16    Number of Visits 21    Date for PT Re-Evaluation 05/14/22   pushed out due to scheduling   Authorization Type Medicare    Authorization Time Period 02-08-22 - 05-09-22; 04-13-22 - 05-14-22    PT Start Time 0759    PT Stop Time 0845    PT Time Calculation (min) 46 min    Equipment Utilized During Treatment --   pool noodle, small bar bells, aquatic weight - 2.5#   Activity Tolerance Patient tolerated treatment well    Behavior During Therapy [facility] for tasks assessed/performed                   Past Medical History:  Diagnosis Date   Arthritis    Coronary atherosclerosis of native coronary artery    Multivessel status post CABG March 2015   DDD (degenerative disc disease), lumbar    Essential hypertension    GERD (gastroesophageal reflux disease)    Hard of hearing both ears    Left ear is worst   Hemorrhoids    History of gout    History of kidney stones    history of myasthenia Gravis 2021   History of [facility] spotted fever    Left ureteral  & renal stone    Lumbago    Mixed hyperlipidemia    Personal history of COVID-19 08/10/2021   Paxlovid   Plantar fascial fibromatosis    Postoperative atrial fibrillation (HCC)    Skin Cancer ([place])    Use of cane as ambulatory aid    Uses roller walker    UTI (urinary tract infection) 09/16/2021   Wears glasses    Past Surgical History:  Procedure Laterality Date   AMPUTATION Right 03/27/2014   Procedure: DEBRIDEMENT AND CLOSURE RIGHT INDEX FINGER;  Surgeon: [name], MD;  Location: [place];  Service: Orthopedics;  Laterality: Right;   BACK SURGERY     lower not sure when per wife on 10-01-2021   BICEPT TENODESIS  11/26/2011    Procedure: BICEPT TENODESIS;  Surgeon: Augustin Schooling, MD;  Location: [place];  Service: Orthopedics;  Laterality: Left;   BIOPSY  08/03/2018   Procedure: BIOPSY;  Surgeon: Rogene Houston, MD;  [facility];  Service: Endoscopy;;  antral   Cataract surgery Bilateral    yrs ago per wife on 10-01-2021   CHOLECYSTECTOMY N/A 09/22/2018   Procedure: LAPAROSCOPIC CHOLECYSTECTOMY;  Surgeon: Virl Cagey, MD;  [facility]: [facility];  Service: General;  Laterality: N/A;   COLONOSCOPY N/A 12/26/2014   Procedure: COLONOSCOPY;  Surgeon: Rogene Houston, MD;  [facility];  Service: Endoscopy;  Laterality: N/A;  730   CORONARY ARTERY BYPASS GRAFT N/A 10/19/2013   Procedure: CORONARY ARTERY BYPASS GRAFTING (CABG);  Surgeon: Rexene Alberts, MD;  Location: [place];  Service: Open Heart Surgery;  Laterality: N/A;  CABG times four using left internal mammary artery and left leg saphenous vein, incision made on right leg but no vein removed   CYSTOSCOPY W/ URETERAL STENT PLACEMENT Right 02/22/2022   Procedure: CYSTOSCOPY WITH RETROGRADE PYELOGRAM/URETERAL STENT PLACEMENT;  Surgeon: Ceasar Mons, MD;  [facility];  Service: Urology;  Laterality: Right;   CYSTOSCOPY WITH RETROGRADE PYELOGRAM, URETEROSCOPY AND STENT PLACEMENT Left 08/24/2016   Procedure: CYSTOSCOPY WITH LEFT RETROGRADE PYELOGRAM, LEFT URETEROSCOPY, BASKET EXTRACTION LEFT URETERAL STONE;  Surgeon: Irine Seal, MD;  [facility];  Service: Urology;  Laterality: Left;   CYSTOSCOPY/URETEROSCOPY/HOLMIUM LASER/STENT PLACEMENT Left 10/06/2021   Procedure: CYSTOSCOPY LEFT RETROGRADE PYELOGRAM URETEROSCOPY/HOLMIUM LASER/STENT PLACEMENT;  Surgeon: Irine Seal, MD;  [facility];  Service: Urology;  Laterality: Left;   CYSTOSCOPY/URETEROSCOPY/HOLMIUM LASER/STENT PLACEMENT Right 03/01/2022   Procedure: CYSTOSCOPY RIGHT URETEROSCOPY/HOLMIUM LASER/STENT PLACEMENT;  Surgeon: Irine Seal, MD;  [facility];  Service: Urology;  Laterality: Right;   ESOPHAGOGASTRODUODENOSCOPY N/A 08/03/2018   Procedure: ESOPHAGOGASTRODUODENOSCOPY (EGD);  Surgeon: Rogene Houston, MD;  [facility];  Service: Endoscopy;  Laterality: N/A;  2:55   EXTRACORPOREAL SHOCK WAVE LITHOTRIPSY Right 06/18/2019   Procedure: EXTRACORPOREAL SHOCK WAVE LITHOTRIPSY (ESWL);  Surgeon: Lucas Mallow, MD;  [facility];  Service: Urology;  Laterality: Right;   INTRAOPERATIVE TRANSESOPHAGEAL ECHOCARDIOGRAM N/A 10/19/2013   Procedure: INTRAOPERATIVE TRANSESOPHAGEAL ECHOCARDIOGRAM;  Surgeon: Rexene Alberts, MD;  Location: [place];  Service: Open Heart Surgery;  Laterality: N/A;   KYPHOPLASTY N/A 03/27/2021   Procedure: Lumbar One and Lumbar Two Kyphoplasty;  Surgeon: Erline Levine, MD;  Location: [place];  Service: Neurosurgery;  Laterality: N/A;   left hand carpal tunnel release  04/04/2015   LEFT HEART CATHETERIZATION WITH CORONARY ANGIOGRAM N/A 10/09/2013   Procedure: LEFT HEART CATHETERIZATION WITH CORONARY ANGIOGRAM;  Surgeon: Burnell Blanks, MD;  Location: [facility] CATH LAB;  Service: Cardiovascular;  Laterality: N/A;   Left shoulder rotator cuff repair  12/04/2011   MOHS procedure  01/06/2021   dr Harl Bowie on left hand   ORIF PERIPROSTHETIC FRACTURE Left 03/19/2020   Procedure: OPEN REDUCTION INTERNAL FIXATION (ORIF) PERIPROSTHETIC FRACTURE LEFT FEMUR WITH FEMORAL REVISION;  Surgeon: Gaynelle Arabian, MD;  [facility];  Service: Orthopedics;  Laterality: Left;   TOTAL HIP ARTHROPLASTY Left 01/28/2020   Procedure: TOTAL HIP ARTHROPLASTY ANTERIOR APPROACH;  Surgeon: Gaynelle Arabian, MD;  [facility];  Service: Orthopedics;  Laterality: Left;  195mn   TOTAL KNEE ARTHROPLASTY Left 04/18/2001   traumaticvamputation of finger  2015   VASECTOMY     yrs ago per wife on 10-01-2021   Patient Active Problem List   Diagnosis Date Noted   Fracture, lumbar vertebra, compression ([place] 03/27/2021   Atrial  fibrillation with RVR ([place]    Periprosthetic fracture around internal prosthetic left hip joint ([place] 03/19/2020   OA (osteoarthritis) of hip 01/28/2020   Closed displaced fracture of left femoral neck ([facility] 01/28/2020   History of colonic polyps 11/12/2019   GERD (gastroesophageal reflux disease) 11/06/2019   Myasthenia gravis with (acute) exacerbation ([place] 11/06/2019   Acute diarrhea 09/17/2019   Heme positive stool 09/17/2019   Unspecified osteoarthritis, unspecified site    Hyperlipidemia, unspecified    Personal history of other diseases of the musculoskeletal system and connective tissue    Atherosclerotic heart disease of native coronary artery without angina pectoris    Hypertensive heart disease without heart failure    Melena    Plantar fascial fibromatosis    Pleurodynia    Calculus of gallbladder without cholecystitis without obstruction 09/14/2018   Nausea without vomiting 06/05/2018   Postoperative atrial fibrillation ([place] 11/09/2013   S/P CABG x 4 10/19/2013   Coronary atherosclerosis of native coronary artery 10/18/2013   Essential hypertension, benign 09/18/2013   Family history of  hypertrophic cardiomyopathy 09/18/2013   Mixed hyperlipidemia     ONSET DATE: 12-29-21 (date of L2-3 discectomy surgery)  REFERRING DIAG: T03.546 (ICD-10-CM) - Presence of left artificial hip joint ; Lt trochanteric bursitis  THERAPY DIAG:  Muscle weakness (generalized)  Unsteadiness on feet  Other abnormalities of gait and mobility  Rationale for Evaluation and Treatment Rehabilitation  SUBJECTIVE:                                                                                                                                                                                              SUBJECTIVE STATEMENT:     Pt reports no changes since pool session on Monday this week - continues to walk 2-3 times/day at home     Pt accompanied by:  wife, [name]  PERTINENT HISTORY: Mr.  Kneeland is a 76 year old male with PMH of Myasthenia Gravis, osteoporosis, CAD who presented to [facility] on 12/11/21 for initial neurosurgical evaluation of low back pain referred by Dr. Sharilyn Sites. He has a history of an L3-S1 fusion with Dr. Glenna Fellows in 2016 (retired) as well as L1/L2 kyphoplasty on 03/27/21 with Dr. Erline Levine.  He reported good relief with lumbar fusion until the last 3 years.  He was diagnosed with myasthenia gravis in 2021 and was placed on steroids.  He feels that this contributed to to his osteoporosis.  He reported low back pain that radiated to his left buttock and groin.  Pain was sharp rated as 10 out of 10 at its worst.  Pain was worse with lying, standing or walking.  He was taking hydrocodone prescribed by his PCP which does not really help.  He reported lower extremity weakness and lower extremity aching.  He had tried ESI with minimal relief.  He has a history of a left hip replacement. He is s/p left L2-3 MED on 12/29/21 with Dr. Linus Mako. [place]. Pt also has Myasthenia Gravis - diagnosed in Jan. 2021   PAIN:  Are you having pain? No pain reported in sitting position Pt states he will have low back pain if he stands or walks for prolonged time period - rates intensity varying from 4-5/10 depending on duration of weight bearing   PRECAUTIONS: None  WEIGHT BEARING RESTRICTIONS No  FALLS: Has patient fallen in last 6 months? No  LIVING ENVIRONMENT: Lives with: lives with their family Lives in: House/apartment Stairs: Yes: External: 3 steps; on right going up Has following equipment at home: Single point cane and Walker - 2 wheeled  PLOF: Independent  PATIENT GOALS decrease Lt hip and low back pain  OBJECTIVE:   04-29-22:  Exercises for bil. Hip and low back strengthening:   TherEx:   Exercises:  5 times sit to stand from mat table - no UE support  Step ups forward onto 6" step LLE 10 reps  Leg press - 70# 3 sets 10 reps bil. LE's  SciFit  level 3.0 x 6" with UE's and LE's for LE strengthening and endurance training  Standing hip strengthening exercises with red theraband - forward, back and side kicks 10 reps each direction, each leg   NeuroRe-ed:  Rockerboard - 10 reps 2 sets with UE support prn on // bars Stepping down to floor alternating feet with minimal UE support 10 reps each leg for improved SLS on each leg Standing on rockerboard - tapping 2 cones 3 reps straight ahead while standing on rockerboard; diagonal taps 3 reps with minimal UE support on // bars Standing on inverted Bosu inside // bars to have UE support prn - weight shifts anterior/posteriorly and laterally 10 reps each Standing on RLE - moving LLE slowly forward/back and then laterally 10 reps each; then performed same exercise with LLE SLS - moved RLE forward/back and out/in 10 reps each with UE support on // bars  Stepping over and back of bolster 10 reps each leg - placed inside // bars but no UE support used on // bars  PATIENT EDUCATION:   Revised HEP on 04-06-22 to include pelvic tilt and resisted hip strengthening with red theraband;  Medbridge (see below) Access Code: [email or web address] URL: [email or web address] Date: 04/07/2022 Prepared by: Ethelene Browns  Exercises - Standing Hip Flexion with Resistance Loop  - 1 x daily - 7 x weekly - 1 sets - 10 reps - 2-3 secs hold - Hip Abduction with Resistance Loop  - 1 x daily - 7 x weekly - 1 sets - 10 reps - 2-3 secs  hold - Hip Extension with Resistance Loop  - 1 x daily - 7 x weekly - 1 sets - 10 reps - Supine Pelvic Tilt  - 1 x daily - 7 x weekly - 1 sets - 10 reps - 5 sec hold    Education details: Medbridge HEP [email or web address] - see above - issued on 03-09-22 Person educated: Patient and Spouse Education method: Explanation and Demonstration, handout Education comprehension: verbalized understanding Access Code: [email or web address] URL: [email or web address] Date: 04/07/2022 Prepared by:  Ethelene Browns  Exercises - Standing Hip Flexion with Resistance Loop  - 1 x daily - 7 x weekly - 1 sets - 10 reps - 2-3 secs hold - Hip Abduction with Resistance Loop  - 1 x daily - 7 x weekly - 1 sets - 10 reps - 2-3 secs  hold - Hip Extension with Resistance Loop  - 1 x daily - 7 x weekly - 1 sets - 10 reps - Supine Pelvic Tilt  - 1 x daily - 7 x weekly - 1 sets - 10 reps - 5 sec hold  HOME EXERCISE PROGRAM: Medbridge [email or web address]  Access Code: [email or web address] URL: [email or web address] Date: 02/09/2022 Prepared by: Ethelene Browns  Exercises - Clamshell  - 1 x daily - 7 x weekly - 1-2 sets - 10 reps - 2-3 hold - Sidelying Hip Abduction  - 1 x daily - 7 x weekly - 1-2 sets - 10 reps - 2 hold  GOALS: Goals reviewed with patient? Yes  SHORT TERM GOALS: Target date: 03-12-22   PT Short Term Goals - 02/09/22 1945       PT SHORT  TERM GOAL #1   Title Initiate aquatic therapy at [facility].; pt will amb. from car to pool area with RW with supervision with c/o Lt hip pain </= 5/10 after amb. this distance.    Time 3    Period Weeks    Status Ongoing - 03-09-22   Target Date 03/12/22      PT SHORT TERM GOAL #2   Title Pt will report decreased Lt hip pain to </= 4/10 with weight bearing.    Baseline pt reports Lt hip pain 7-8/10 with weight bearing    Time 3    Period Weeks    Status Ongoing - 03-09-22   Target Date 03/12/22      PT SHORT TERM GOAL #3   Title Pt will amb. 34' without device with SBA for increased household amb.    Baseline pt using SPC    Time 3    Period Weeks    Status Met - 03-09-22   Target Date 03/12/22      PT SHORT TERM GOAL #4   Title Independent in HEP for Lt hip musc. strengthening and ITB stretching.    Time 3    Period Weeks    Status Ongoing- 03-09-22 - pt has only had 1 land session since eval due to having had cytoscopy in early July   Target Date 03/12/22             LONG TERM GOALS: Target date: 04-09-22    PT Long  Term Goals - 02/09/22 1952       PT LONG TERM GOAL #1   Title Independent in aquatic HEP to be continued at facility of pt's choice upon discharge from PT.    Time 6    Period Weeks    Status Ongoing   Target Date 04-02-22     PT LONG TERM GOAL #2   Title Pt will report reduced back/hip pain to </= 2/10 with weight bearing.    Baseline 4-5/10 reported with weight bearing on 04-13-22   Time 6    Period Weeks    Status Partially met; standing is worse than walking   Target Date 04/02/22      PT LONG TERM GOAL #3   Title Pt will perform sit to stand transfer with bil. UE support from chair with c/o pain </2/10 in low back and Lt hip.    Baseline 4-5/10 on 02-09-22    Time 6    Period Weeks    Status Goal met 04-13-22   Target Date 04/02/22      PT LONG TERM GOAL #4   Title Improve TUG score by at least 3 secs without assistive device to demo improved functional mobility.    Baseline 12.8 secs without use of device on 03-09-22   Time 6    Period Weeks    Status Deferred as score is WNL's on 03-09-22   Target Date 04/02/22      PT LONG TERM GOAL #5   Title Pt will tolerate lying supine for at least 1 hour for increased comfort with sleeping.    Baseline < 30" - 02-09-22    Time 6    Period Weeks    Status Met 04-13-22   Target Date 04/02/22             NEW LTG's - target date 05-14-22  PT Long Term Goals - 04/14/22 1736       PT LONG TERM GOAL #1  Title Independent in aquatic HEP to be continued at facility of pt's choice upon discharge from PT.    Time 4    Period Weeks    Status  Ongoing   Target Date 05/14/22      PT LONG TERM GOAL #2   Title Pt will report reduced back/hip pain to </= 3/10 with weight bearing.    Baseline 4-5/10 reported with weight bearing    Time 4    Period Weeks    Status Revised    Target Date 05/14/22      PT LONG TERM GOAL #3   Title Pt will improve 5 times sit to stand score to </= 13 secs without UE support to demo improved strength  in bil. LE's.    Baseline 15.34 secs without UE support    Time 4    Period Weeks    Status New    Target Date 05/14/22      PT LONG TERM GOAL #4   Title Pt will improve RLE SLS to >/= 7 secs and LLE SLS to >/= 4 secs for improved balance with gait.    Baseline RLE 5.53 secs, LLE 2.69 secs    Time 4    Period Weeks    Status New    Target Date 05/14/22                 ASSESSMENT:  CLINICAL IMPRESSION: PT session focused on LE strengthening and balance.  Pt has fluid build up in Rt knee (states he will be in need of TKR); session was modified to eliminate RLE exercise of step ups to prevent increased stress and pressure on Rt knee joint to reduce pain as able with PRE's.    Pt is progressing well towards LTG's.    OBJECTIVE IMPAIRMENTS decreased balance, difficulty walking, decreased strength, postural dysfunction, and pain.   ACTIVITY LIMITATIONS carrying, bending, standing, squatting, sleeping, stairs, and locomotion level  PARTICIPATION LIMITATIONS: meal prep, cleaning, laundry, shopping, community activity, and yard work  PERSONAL FACTORS Past/current experiences, Time since onset of injury/illness/exacerbation, and 1-2 comorbidities: low back pain  are also affecting patient's functional outcome.   REHAB POTENTIAL: Good  CLINICAL DECISION MAKING: Evolving/moderate complexity  EVALUATION COMPLEXITY: Moderate  PLAN: PT FREQUENCY: 2x/week  PT DURATION: 4 weeks - plan for 4 additional land visits, 4 aquatic visits  PLANNED INTERVENTIONS: Therapeutic exercises, Therapeutic activity, Neuromuscular re-education, Balance training, Gait training, Patient/Family education, Stair training, Aquatic Therapy, Cryotherapy, and Manual therapy  PLAN FOR NEXT SESSION:  cont with LE strengthening and gait & balance training   Tiwanna Tuch, [place], PT 04/30/2022, 3:55 PM

## 2022-04-30 ENCOUNTER — Encounter: Payer: Self-pay | Admitting: Physical Therapy

## 2022-05-03 ENCOUNTER — Ambulatory Visit: Payer: Medicare Other | Admitting: Physical Therapy

## 2022-05-03 DIAGNOSIS — R2689 Other abnormalities of gait and mobility: Secondary | ICD-10-CM

## 2022-05-03 DIAGNOSIS — M6281 Muscle weakness (generalized): Secondary | ICD-10-CM

## 2022-05-03 DIAGNOSIS — R2681 Unsteadiness on feet: Secondary | ICD-10-CM | POA: Diagnosis not present

## 2022-05-04 ENCOUNTER — Ambulatory Visit: Payer: Medicare Other | Admitting: Physical Therapy

## 2022-05-04 ENCOUNTER — Encounter: Payer: Self-pay | Admitting: Physical Therapy

## 2022-05-04 DIAGNOSIS — R2681 Unsteadiness on feet: Secondary | ICD-10-CM

## 2022-05-04 DIAGNOSIS — M6281 Muscle weakness (generalized): Secondary | ICD-10-CM

## 2022-05-04 DIAGNOSIS — R2689 Other abnormalities of gait and mobility: Secondary | ICD-10-CM

## 2022-05-04 NOTE — Therapy (Signed)
OUTPATIENT PHYSICAL THERAPY TREATMENT NOTE   Patient Name: Brandon [name] MRN: [id number] DOB:1946-06-07, 76 y.o., male Today's Date: 05/04/2022   PCP: Sharilyn Sites, MD REFERRING PROVIDER: Florinda Marker    PT End of Session - 05/04/22 1919     Visit Number 17    Number of Visits 21    Date for PT Re-Evaluation 05/14/22   pushed out due to scheduling   Authorization Type Medicare    Authorization Time Period 02-08-22 - 05-09-22; 04-13-22 - 05-14-22    PT Start Time 1310    PT Stop Time 1400    PT Time Calculation (min) 50 min    Equipment Utilized During Treatment Other (comment)   pool noodle, small bar bells, aquatic weight - 2.5#   Activity Tolerance Patient tolerated treatment well    Behavior During Therapy [facility] for tasks assessed/performed              Past Medical History:  Diagnosis Date   Arthritis    Coronary atherosclerosis of native coronary artery    Multivessel status post CABG March 2015   DDD (degenerative disc disease), lumbar    Essential hypertension    GERD (gastroesophageal reflux disease)    Hard of hearing both ears    Left ear is worst   Hemorrhoids    History of gout    History of kidney stones    history of myasthenia Gravis 2021   History of [facility] spotted fever    Left ureteral  & renal stone    Lumbago    Mixed hyperlipidemia    Personal history of COVID-19 08/10/2021   Paxlovid   Plantar fascial fibromatosis    Postoperative atrial fibrillation (HCC)    Skin Cancer ([place])    Use of cane as ambulatory aid    Uses roller walker    UTI (urinary tract infection) 09/16/2021   Wears glasses    Past Surgical History:  Procedure Laterality Date   AMPUTATION Right 03/27/2014   Procedure: DEBRIDEMENT AND CLOSURE RIGHT INDEX FINGER;  Surgeon: [name], MD;  Location: [place];  Service: Orthopedics;  Laterality: Right;   BACK SURGERY     lower not sure when per wife on 10-01-2021   BICEPT TENODESIS  11/26/2011    Procedure: BICEPT TENODESIS;  Surgeon: Augustin Schooling, MD;  Location: [place];  Service: Orthopedics;  Laterality: Left;   BIOPSY  08/03/2018   Procedure: BIOPSY;  Surgeon: Rogene Houston, MD;  [facility];  Service: Endoscopy;;  antral   Cataract surgery Bilateral    yrs ago per wife on 10-01-2021   CHOLECYSTECTOMY N/A 09/22/2018   Procedure: LAPAROSCOPIC CHOLECYSTECTOMY;  Surgeon: Virl Cagey, MD;  [facility]: [facility];  Service: General;  Laterality: N/A;   COLONOSCOPY N/A 12/26/2014   Procedure: COLONOSCOPY;  Surgeon: Rogene Houston, MD;  [facility];  Service: Endoscopy;  Laterality: N/A;  730   CORONARY ARTERY BYPASS GRAFT N/A 10/19/2013   Procedure: CORONARY ARTERY BYPASS GRAFTING (CABG);  Surgeon: Rexene Alberts, MD;  Location: [place];  Service: Open Heart Surgery;  Laterality: N/A;  CABG times four using left internal mammary artery and left leg saphenous vein, incision made on right leg but no vein removed   CYSTOSCOPY W/ URETERAL STENT PLACEMENT Right 02/22/2022   Procedure: CYSTOSCOPY WITH RETROGRADE PYELOGRAM/URETERAL STENT PLACEMENT;  Surgeon: Ceasar Mons, MD;  [facility];  Service: Urology;  Laterality: Right;   CYSTOSCOPY  WITH RETROGRADE PYELOGRAM, URETEROSCOPY AND STENT PLACEMENT Left 08/24/2016   Procedure: CYSTOSCOPY WITH LEFT RETROGRADE PYELOGRAM, LEFT URETEROSCOPY, BASKET EXTRACTION LEFT URETERAL STONE;  Surgeon: Irine Seal, MD;  [facility];  Service: Urology;  Laterality: Left;   CYSTOSCOPY/URETEROSCOPY/HOLMIUM LASER/STENT PLACEMENT Left 10/06/2021   Procedure: CYSTOSCOPY LEFT RETROGRADE PYELOGRAM URETEROSCOPY/HOLMIUM LASER/STENT PLACEMENT;  Surgeon: Irine Seal, MD;  Location: [facility];  Service: Urology;  Laterality: Left;   CYSTOSCOPY/URETEROSCOPY/HOLMIUM LASER/STENT PLACEMENT Right 03/01/2022   Procedure: CYSTOSCOPY RIGHT URETEROSCOPY/HOLMIUM LASER/STENT PLACEMENT;  Surgeon: Irine Seal, MD;  [facility];  Service: Urology;  Laterality: Right;   ESOPHAGOGASTRODUODENOSCOPY N/A 08/03/2018   Procedure: ESOPHAGOGASTRODUODENOSCOPY (EGD);  Surgeon: Rogene Houston, MD;  [facility];  Service: Endoscopy;  Laterality: N/A;  2:55   EXTRACORPOREAL SHOCK WAVE LITHOTRIPSY Right 06/18/2019   Procedure: EXTRACORPOREAL SHOCK WAVE LITHOTRIPSY (ESWL);  Surgeon: Lucas Mallow, MD;  [facility];  Service: Urology;  Laterality: Right;   INTRAOPERATIVE TRANSESOPHAGEAL ECHOCARDIOGRAM N/A 10/19/2013   Procedure: INTRAOPERATIVE TRANSESOPHAGEAL ECHOCARDIOGRAM;  Surgeon: Rexene Alberts, MD;  Location: [place];  Service: Open Heart Surgery;  Laterality: N/A;   KYPHOPLASTY N/A 03/27/2021   Procedure: Lumbar One and Lumbar Two Kyphoplasty;  Surgeon: Erline Levine, MD;  Location: [place];  Service: Neurosurgery;  Laterality: N/A;   left hand carpal tunnel release  04/04/2015   LEFT HEART CATHETERIZATION WITH CORONARY ANGIOGRAM N/A 10/09/2013   Procedure: LEFT HEART CATHETERIZATION WITH CORONARY ANGIOGRAM;  Surgeon: Burnell Blanks, MD;  Location: [facility];  Service: Cardiovascular;  Laterality: N/A;   Left shoulder rotator cuff repair  12/04/2011   MOHS procedure  01/06/2021   dr Harl Bowie on left hand   ORIF PERIPROSTHETIC FRACTURE Left 03/19/2020   Procedure: OPEN REDUCTION INTERNAL FIXATION (ORIF) PERIPROSTHETIC FRACTURE LEFT FEMUR WITH FEMORAL REVISION;  Surgeon: Gaynelle Arabian, MD;  [facility];  Service: Orthopedics;  Laterality: Left;   TOTAL HIP ARTHROPLASTY Left 01/28/2020   Procedure: TOTAL HIP ARTHROPLASTY ANTERIOR APPROACH;  Surgeon: Gaynelle Arabian, MD;  [facility];  Service: Orthopedics;  Laterality: Left;  19mn   TOTAL KNEE ARTHROPLASTY Left 04/18/2001   traumaticvamputation of finger  2015   VASECTOMY     yrs ago per wife on 10-01-2021   Patient Active Problem List   Diagnosis Date Noted   Fracture, lumbar vertebra, compression ([facility] 03/27/2021   Atrial  fibrillation with RVR ([facility]    Periprosthetic fracture around internal prosthetic left hip joint ([facility] 03/19/2020   OA (osteoarthritis) of hip 01/28/2020   Closed displaced fracture of left femoral neck ([facility] 01/28/2020   History of colonic polyps 11/12/2019   GERD (gastroesophageal reflux disease) 11/06/2019   Myasthenia gravis with (acute) exacerbation ([place] 11/06/2019   Acute diarrhea 09/17/2019   Heme positive stool 09/17/2019   Unspecified osteoarthritis, unspecified site    Hyperlipidemia, unspecified    Personal history of other diseases of the musculoskeletal system and connective tissue    Atherosclerotic heart disease of native coronary artery without angina pectoris    Hypertensive heart disease without heart failure    Melena    Plantar fascial fibromatosis    Pleurodynia    Calculus of gallbladder without cholecystitis without obstruction 09/14/2018   Nausea without vomiting 06/05/2018   Postoperative atrial fibrillation ([facility] 11/09/2013   S/P CABG x 4 10/19/2013   Coronary atherosclerosis of native coronary artery 10/18/2013   Essential hypertension, benign 09/18/2013   Family history of hypertrophic cardiomyopathy 09/18/2013  Mixed hyperlipidemia     ONSET DATE: 12-29-21 (date of L2-3 MED surgery)  REFERRING DIAG: P71.062 (ICD-10-CM) - Presence of left artificial hip joint ; Lt trochanteric bursitis  THERAPY DIAG:  Muscle weakness (generalized)  Unsteadiness on feet  Other abnormalities of gait and mobility  Rationale for Evaluation and Treatment Rehabilitation  SUBJECTIVE:                                                                                                                                                                                              SUBJECTIVE STATEMENT: Pt reports no changes - "doing pretty good" - continues to improve with balance   Pt accompanied by:  wife, [name]  PERTINENT HISTORY: Mr. Byard is a 76 year old male with PMH  of Myasthenia Gravis, osteoporosis, CAD who presented to [facility] on 12/11/21 for initial neurosurgical evaluation of low back pain referred by Dr. Sharilyn Sites. He has a history of an L3-S1 fusion with Dr. Glenna Fellows in 2016 (retired) as well as L1/L2 kyphoplasty on 03/27/21 with Dr. Erline Levine.  He reported good relief with lumbar fusion until the last 3 years.  He was diagnosed with myasthenia gravis in 2021 and was placed on steroids.  He feels that this contributed to to his osteoporosis.  He reported low back pain that radiated to his left buttock and groin.  Pain was sharp rated as 10 out of 10 at its worst.  Pain was worse with lying, standing or walking.  He was taking hydrocodone prescribed by his PCP which does not really help.  He reported lower extremity weakness and lower extremity aching.  He had tried ESI with minimal relief.  He has a history of a left hip replacement. He is s/p left L2-3 MED on 12/29/21 with Dr. Linus Mako. [place]. Pt also has Myasthenia Gravis - diagnosed in Jan. 2021   PAIN:  Are you having pain? Yes low back and Lt hip Low back 2-3/10 with weight bearing; no pain in sitting;  No hip pain with weight bearing; no pain in sitting  Sitting helps the back pain; walking helps the hip pain - trying to walk 10'-15' 2-3 times a day Aggravating factors - weight bearing for back; pressure aggravates the hip  PRECAUTIONS: None  WEIGHT BEARING RESTRICTIONS No  FALLS: Has patient fallen in last 6 months? No  LIVING ENVIRONMENT: Lives with: lives with their family Lives in: House/apartment Stairs: Yes: External: 3 steps; on right going up Has following equipment at home: Single point cane and Walker - 2 wheeled  PLOF: Independent  PATIENT GOALS decrease Lt  hip and low back pain   OBJECTIVE: 05-03-22  Aquatic therapy at [facility] - pool temp. 90 degrees  Patient seen for aquatic therapy today.  Treatment took place in water 3.5-4.5 feet deep depending upon  activity.  Pt entered and exited the pool via step negotiation with use of hand rails modified independently.  Pt performed water walking forwards 18' x 4 reps, backwards 18' x 4 reps and sideways 18' x 4 reps each for warm up  Pt performed runner's stretch - for hamstring/gastroc stretch - bil. LE's 30 sec hold x 1 rep each leg;   Gastroc stretch bil. LE's with toes/forefeet on pool wall 30 sec hold x 1 rep   Marching in place 10 reps without UE support; 2nd set - marching in place - holding bar bell - contralateral movement of UE/LE  with 1-2 sec hold to facilitate improved SLS on each leg - 18' x 2 reps forward,18' x 2 reps backwards   Pt performed squats x 10 reps with bil. UE support on pool edge; unilateral squats each leg 10 reps with bil. UE support on pool wall  Standing hip abduction with use of 2.5# aquatic weight 10 reps bil. LE's; pt performed hip flexion, extension and marching 10 reps each leg with UE support with use of aquatic weight 2.5# each leg;  pt performed hip flexion/extension with flexed knee on each leg with use of 2.5# aquatic weight for strengthening and for improved SLS on each leg  Pt performed crossovers front 18' x 4 reps, stepping behind 18' x 2 reps; braiding 18' x 4 reps with UE support on bar bells for assist with balance with these exercises   Sidestepping with squats with small bar bells for UE resistance and for increased core stabilization with exercise - 18' x 4 reps  Trunk rotation with wide BOS with use of yellow noodle 10 reps to each side for increased trunk and lumbar ROM 10 reps to each side   Pt performed closed chain strengthening exercise for hip extensor strengthening - pushing yellow noodle down toward floor 2 sets 10 reps each leg; then knee flexion/extension 10 reps each leg with foot on yellow noodle for resistance  Ai Chi posture "Balancing" 10 reps each side with UE support on edge of pool prn for assist with balance   Pt requires  buoyancy of water for support with standing balance and with gait training without UE support; buoyancy of water also needed for joint unloading for pain reduction with weight bearing; buoyancy also provides spinal decompression for reduced back pain in standing;  viscosity of water needed for resistance for strengthening bil. LE's:  water current provides perturbations for challenges with balance that cannot be safely performed on land          PATIENT EDUCATION: Education details: [facility] HEP [email or web address] - see above - issued on 03-09-22 Person educated: Patient and Spouse Education method: Customer service manager, handout Education comprehension: verbalized understanding   HOME EXERCISE PROGRAM: [facility] [email or web address]  Access Code: [email or web address] URL: [email or web address] Date: 02/09/2022 Prepared by: Ethelene Browns  Exercises - Clamshell  - 1 x daily - 7 x weekly - 1-2 sets - 10 reps - 2-3 hold - Sidelying Hip Abduction  - 1 x daily - 7 x weekly - 1-2 sets - 10 reps - 2 hold  GOALS: Goals reviewed with patient? Yes  SHORT TERM GOALS: Target date: 03-12-22   PT Short Term Goals - 02/09/22 1945  PT SHORT TERM GOAL #1   Title Initiate aquatic therapy at [facility].; pt will amb. from car to pool area with RW with supervision with c/o Lt hip pain </= 5/10 after amb. this distance.    Time 3    Period Weeks    Status Ongoing - 03-09-22   Target Date 03/12/22      PT SHORT TERM GOAL #2   Title Pt will report decreased Lt hip pain to </= 4/10 with weight bearing.    Baseline pt reports Lt hip pain 7-8/10 with weight bearing    Time 3    Period Weeks    Status Ongoing - 03-09-22   Target Date 03/12/22      PT SHORT TERM GOAL #3   Title Pt will amb. 37' without device with SBA for increased household amb.    Baseline pt using SPC    Time 3    Period Weeks    Status Met - 03-09-22   Target Date 03/12/22      PT SHORT TERM GOAL #4   Title  Independent in HEP for Lt hip musc. strengthening and ITB stretching.    Time 3    Period Weeks    Status Ongoing - pt has only had 1 land session since eval due to having had cytoscopy in early July   Target Date 03/12/22                    NEW LTG's - target date 05-14-22  PT Long Term Goals - 04/14/22 1736       PT LONG TERM GOAL #1   Title Independent in aquatic HEP to be continued at facility of pt's choice upon discharge from PT.    Time 4    Period Weeks    Status  Ongoing   Target Date 05/14/22      PT LONG TERM GOAL #2   Title Pt will report reduced back/hip pain to </= 3/10 with weight bearing.    Baseline 4-5/10 reported with weight bearing    Time 4    Period Weeks    Status Revised    Target Date 05/14/22      PT LONG TERM GOAL #3   Title Pt will improve 5 times sit to stand score to </= 13 secs without UE support to demo improved strength in bil. LE's.    Baseline 15.34 secs without UE support    Time 4    Period Weeks    Status New    Target Date 05/14/22      PT LONG TERM GOAL #4   Title Pt will improve RLE SLS to >/= 7 secs and LLE SLS to >/= 4 secs for improved balance with gait.    Baseline RLE 5.53 secs, LLE 2.69 secs    Time 4    Period Weeks    Status New    Target Date 05/14/22         ASSESSMENT:  CLINICAL IMPRESSION: Aquatic therapy session focused on water walking in various directions, strengthening exercises for bil. LE's and balance exercises to fascilitate improved SLS on each leg.  Pt able to perform all exercises with no c/o back pain in 4' water depth. Pt is progressing well towards LTG's.  Cont with POC.       OBJECTIVE IMPAIRMENTS decreased balance, difficulty walking, decreased strength, postural dysfunction, and pain.   ACTIVITY LIMITATIONS carrying, bending, standing, squatting, sleeping, stairs, and  locomotion level  PARTICIPATION LIMITATIONS: meal prep, cleaning, laundry, shopping, community activity, and yard  work  PERSONAL FACTORS Past/current experiences, Time since onset of injury/illness/exacerbation, and 1-2 comorbidities: low back pain  are also affecting patient's functional outcome.   REHAB POTENTIAL: Good  CLINICAL DECISION MAKING: Evolving/moderate complexity  EVALUATION COMPLEXITY: Moderate  PLAN: PT FREQUENCY: 2x/week  PT DURATION: 6 weeks - plan for 6 land visits, 6 aquatic visits  PLANNED INTERVENTIONS: Therapeutic exercises, Therapeutic activity, Neuromuscular re-education, Balance training, Gait training, Patient/Family education, Stair training, Aquatic Therapy, Cryotherapy, and Manual therapy  PLAN FOR NEXT SESSION:  cont with core strengthening, LLE strengthening    Maclovia Uher, [place], PT 05/04/2022, 7:21 PM

## 2022-05-05 ENCOUNTER — Encounter: Payer: Self-pay | Admitting: Physical Therapy

## 2022-05-05 NOTE — Therapy (Signed)
OUTPATIENT PHYSICAL THERAPY NEURO TREATMENT NOTE   Patient Name: Brandon [name] MRN: [id number] DOB:12/02/1945, 76 y.o., male Today's Date: 05/05/2022   PCP: Sharilyn Sites, MD REFERRING PROVIDER: Florinda Marker    PT End of Session - 05/05/22 1541     Visit Number 18    Number of Visits 21    Date for PT Re-Evaluation 05/14/22   pushed out due to scheduling   Authorization Type Medicare    Authorization Time Period 02-08-22 - 05-09-22; 04-13-22 - 05-14-22    PT Start Time 0840    PT Stop Time 0925    PT Time Calculation (min) 45 min    Equipment Utilized During Treatment --   pool noodle, small bar bells, aquatic weight - 2.5#   Activity Tolerance Patient tolerated treatment well    Behavior During Therapy [facility] for tasks assessed/performed                   Past Medical History:  Diagnosis Date   Arthritis    Coronary atherosclerosis of native coronary artery    Multivessel status post CABG March 2015   DDD (degenerative disc disease), lumbar    Essential hypertension    GERD (gastroesophageal reflux disease)    Hard of hearing both ears    Left ear is worst   Hemorrhoids    History of gout    History of kidney stones    history of myasthenia Gravis 2021   History of [facility] spotted fever    Left ureteral  & renal stone    Lumbago    Mixed hyperlipidemia    Personal history of COVID-19 08/10/2021   Paxlovid   Plantar fascial fibromatosis    Postoperative atrial fibrillation (HCC)    Skin Cancer ([place])    Use of cane as ambulatory aid    Uses roller walker    UTI (urinary tract infection) 09/16/2021   Wears glasses    Past Surgical History:  Procedure Laterality Date   AMPUTATION Right 03/27/2014   Procedure: DEBRIDEMENT AND CLOSURE RIGHT INDEX FINGER;  Surgeon: [name], MD;  Location: [place];  Service: Orthopedics;  Laterality: Right;   BACK SURGERY     lower not sure when per wife on 10-01-2021   BICEPT TENODESIS  11/26/2011    Procedure: BICEPT TENODESIS;  Surgeon: Augustin Schooling, MD;  Location: [place];  Service: Orthopedics;  Laterality: Left;   BIOPSY  08/03/2018   Procedure: BIOPSY;  Surgeon: Rogene Houston, MD;  [facility];  Service: Endoscopy;;  antral   Cataract surgery Bilateral    yrs ago per wife on 10-01-2021   CHOLECYSTECTOMY N/A 09/22/2018   Procedure: LAPAROSCOPIC CHOLECYSTECTOMY;  Surgeon: Virl Cagey, MD;  [facility];  Service: General;  Laterality: N/A;   COLONOSCOPY N/A 12/26/2014   Procedure: COLONOSCOPY;  Surgeon: Rogene Houston, MD;  [facility];  Service: Endoscopy;  Laterality: N/A;  730   CORONARY ARTERY BYPASS GRAFT N/A 10/19/2013   Procedure: CORONARY ARTERY BYPASS GRAFTING (CABG);  Surgeon: Rexene Alberts, MD;  Location: [place];  Service: Open Heart Surgery;  Laterality: N/A;  CABG times four using left internal mammary artery and left leg saphenous vein, incision made on right leg but no vein removed   CYSTOSCOPY W/ URETERAL STENT PLACEMENT Right 02/22/2022   Procedure: CYSTOSCOPY WITH RETROGRADE PYELOGRAM/URETERAL STENT PLACEMENT;  Surgeon: Ceasar Mons, MD;  [facility];  Service: Urology;  Laterality: Right;   CYSTOSCOPY WITH RETROGRADE PYELOGRAM, URETEROSCOPY AND STENT PLACEMENT Left 08/24/2016   Procedure: CYSTOSCOPY WITH LEFT RETROGRADE PYELOGRAM, LEFT URETEROSCOPY, BASKET EXTRACTION LEFT URETERAL STONE;  Surgeon: Irine Seal, MD;  [facility];  Service: Urology;  Laterality: Left;   CYSTOSCOPY/URETEROSCOPY/HOLMIUM LASER/STENT PLACEMENT Left 10/06/2021   Procedure: CYSTOSCOPY LEFT RETROGRADE PYELOGRAM URETEROSCOPY/HOLMIUM LASER/STENT PLACEMENT;  Surgeon: Irine Seal, MD;  Location: [facility];  Service: Urology;  Laterality: Left;   CYSTOSCOPY/URETEROSCOPY/HOLMIUM LASER/STENT PLACEMENT Right 03/01/2022   Procedure: CYSTOSCOPY RIGHT URETEROSCOPY/HOLMIUM LASER/STENT PLACEMENT;  Surgeon: Irine Seal, MD;  [facility];  Service: Urology;  Laterality: Right;   ESOPHAGOGASTRODUODENOSCOPY N/A 08/03/2018   Procedure: ESOPHAGOGASTRODUODENOSCOPY (EGD);  Surgeon: Rogene Houston, MD;  [facility];  Service: Endoscopy;  Laterality: N/A;  2:55   EXTRACORPOREAL SHOCK WAVE LITHOTRIPSY Right 06/18/2019   Procedure: EXTRACORPOREAL SHOCK WAVE LITHOTRIPSY (ESWL);  Surgeon: Lucas Mallow, MD;  [facility];  Service: Urology;  Laterality: Right;   INTRAOPERATIVE TRANSESOPHAGEAL ECHOCARDIOGRAM N/A 10/19/2013   Procedure: INTRAOPERATIVE TRANSESOPHAGEAL ECHOCARDIOGRAM;  Surgeon: Rexene Alberts, MD;  Location: [place];  Service: Open Heart Surgery;  Laterality: N/A;   KYPHOPLASTY N/A 03/27/2021   Procedure: Lumbar One and Lumbar Two Kyphoplasty;  Surgeon: Erline Levine, MD;  Location: [place];  Service: Neurosurgery;  Laterality: N/A;   left hand carpal tunnel release  04/04/2015   LEFT HEART CATHETERIZATION WITH CORONARY ANGIOGRAM N/A 10/09/2013   Procedure: LEFT HEART CATHETERIZATION WITH CORONARY ANGIOGRAM;  Surgeon: Burnell Blanks, MD;  Location: [facility] CATH LAB;  Service: Cardiovascular;  Laterality: N/A;   Left shoulder rotator cuff repair  12/04/2011   MOHS procedure  01/06/2021   dr Harl Bowie on left hand   ORIF PERIPROSTHETIC FRACTURE Left 03/19/2020   Procedure: OPEN REDUCTION INTERNAL FIXATION (ORIF) PERIPROSTHETIC FRACTURE LEFT FEMUR WITH FEMORAL REVISION;  Surgeon: Gaynelle Arabian, MD;  [facility];  Service: Orthopedics;  Laterality: Left;   TOTAL HIP ARTHROPLASTY Left 01/28/2020   Procedure: TOTAL HIP ARTHROPLASTY ANTERIOR APPROACH;  Surgeon: Gaynelle Arabian, MD;  [facility];  Service: Orthopedics;  Laterality: Left;  19mn   TOTAL KNEE ARTHROPLASTY Left 04/18/2001   traumaticvamputation of finger  2015   VASECTOMY     yrs ago per wife on 10-01-2021   Patient Active Problem List   Diagnosis Date Noted   Fracture, lumbar vertebra, compression ([place] 03/27/2021   Atrial  fibrillation with RVR ([place]    Periprosthetic fracture around internal prosthetic left hip joint ([place] 03/19/2020   OA (osteoarthritis) of hip 01/28/2020   Closed displaced fracture of left femoral neck ([facility] 01/28/2020   History of colonic polyps 11/12/2019   GERD (gastroesophageal reflux disease) 11/06/2019   Myasthenia gravis with (acute) exacerbation ([place] 11/06/2019   Acute diarrhea 09/17/2019   Heme positive stool 09/17/2019   Unspecified osteoarthritis, unspecified site    Hyperlipidemia, unspecified    Personal history of other diseases of the musculoskeletal system and connective tissue    Atherosclerotic heart disease of native coronary artery without angina pectoris    Hypertensive heart disease without heart failure    Melena    Plantar fascial fibromatosis    Pleurodynia    Calculus of gallbladder without cholecystitis without obstruction 09/14/2018   Nausea without vomiting 06/05/2018   Postoperative atrial fibrillation ([place] 11/09/2013   S/P CABG x 4 10/19/2013   Coronary atherosclerosis of native coronary artery 10/18/2013   Essential hypertension, benign 09/18/2013   Family history of  hypertrophic cardiomyopathy 09/18/2013   Mixed hyperlipidemia     ONSET DATE: 12-29-21 (date of L2-3 discectomy surgery)  REFERRING DIAG: W73.710 (ICD-10-CM) - Presence of left artificial hip joint ; Lt trochanteric bursitis  THERAPY DIAG:  Muscle weakness (generalized)  Unsteadiness on feet  Other abnormalities of gait and mobility  Rationale for Evaluation and Treatment Rehabilitation  SUBJECTIVE:                                                                                                                                                                                              SUBJECTIVE STATEMENT:     Pt reports he is doing well - no changes since yesterday when he had aquatic therapy session; pt reports he walks 2-3 times/day at home in his long driveway - takes  rollator to have in case he needs assist with balance and to help with back pain if needed     Pt accompanied by:  wife, [name]  PERTINENT HISTORY: Mr. Ambrosio is a 76 year old male with PMH of Myasthenia Gravis, osteoporosis, CAD who presented to [facility] on 12/11/21 for initial neurosurgical evaluation of low back pain referred by Dr. Sharilyn Sites. He has a history of an L3-S1 fusion with Dr. Glenna Fellows in 2016 (retired) as well as L1/L2 kyphoplasty on 03/27/21 with Dr. Erline Levine.  He reported good relief with lumbar fusion until the last 3 years.  He was diagnosed with myasthenia gravis in 2021 and was placed on steroids.  He feels that this contributed to to his osteoporosis.  He reported low back pain that radiated to his left buttock and groin.  Pain was sharp rated as 10 out of 10 at its worst.  Pain was worse with lying, standing or walking.  He was taking hydrocodone prescribed by his PCP which does not really help.  He reported lower extremity weakness and lower extremity aching.  He had tried ESI with minimal relief.  He has a history of a left hip replacement. He is s/p left L2-3 MED on 12/29/21 with Dr. Linus Mako. [place]. Pt also has Myasthenia Gravis - diagnosed in Jan. 2021   PAIN:  Are you having pain? No pain reported in sitting position Pt states he will have low back pain if he stands or walks for prolonged time period - rates intensity varying from 4-5/10 depending on duration of weight bearing   PRECAUTIONS: None  WEIGHT BEARING RESTRICTIONS No  FALLS: Has patient fallen in last 6 months? No  LIVING ENVIRONMENT: Lives with: lives with their family Lives in: House/apartment Stairs: Yes: External: 3 steps; on  right going up Has following equipment at home: Single point cane and Walker - 2 wheeled  PLOF: Independent  PATIENT GOALS decrease Lt hip and low back pain   OBJECTIVE:   05-04-22:  Exercises for bil. Hip and low back strengthening:   TherEx:    Exercises:  Runner's stretch bil. LE's at counter - 1 rep each leg - 30 sec hold each leg  Standing on inverted Bosu inside // bars - with bil. UE support - pt performed 10 small range squats  with bil. UE support Step ups forward onto 6" step LLE 10 reps  Leg press - 70# 3 sets 10 reps bil. LE's  Bil. LE's - heel raises with minimal UE support  Standing hip strengthening exercises with red theraband - forward, back and side kicks 5 reps x 2 sets each direction,  each leg   NeuroRe-ed:  Rockerboard - 10 reps 2 sets with UE support prn on // bars Stepping down to floor alternating feet with minimal UE support 10 reps each leg for improved SLS on each leg Standing on rockerboard - tapping 2 cones 3 reps straight ahead while standing on rockerboard; diagonal taps 3 reps with minimal UE support on // bars Standing on inverted Bosu inside // bars to have UE support prn - weight shifts anterior/posteriorly and laterally 10 reps each Standing on RLE - moving LLE slowly forward/back and then laterally 10 reps each; then performed same exercise with LLE SLS - moved RLE forward/back and out/in 10 reps each with UE support on // bars    PATIENT EDUCATION:   Revised HEP on 04-06-22 to include pelvic tilt and resisted hip strengthening with red theraband;  Medbridge (see below) Access Code: [email or web address] URL: [email or web address] Date: 04/07/2022 Prepared by: Ethelene Browns  Exercises - Standing Hip Flexion with Resistance Loop  - 1 x daily - 7 x weekly - 1 sets - 10 reps - 2-3 secs hold - Hip Abduction with Resistance Loop  - 1 x daily - 7 x weekly - 1 sets - 10 reps - 2-3 secs  hold - Hip Extension with Resistance Loop  - 1 x daily - 7 x weekly - 1 sets - 10 reps - Supine Pelvic Tilt  - 1 x daily - 7 x weekly - 1 sets - 10 reps - 5 sec hold    Education details: Medbridge HEP [email or web address] - see above - issued on 03-09-22 Person educated: Patient and Spouse Education method:  Explanation and Demonstration, handout Education comprehension: verbalized understanding Access Code: [email or web address] URL: [email or web address] Date: 04/07/2022 Prepared by: Ethelene Browns  Exercises - Standing Hip Flexion with Resistance Loop  - 1 x daily - 7 x weekly - 1 sets - 10 reps - 2-3 secs hold - Hip Abduction with Resistance Loop  - 1 x daily - 7 x weekly - 1 sets - 10 reps - 2-3 secs  hold - Hip Extension with Resistance Loop  - 1 x daily - 7 x weekly - 1 sets - 10 reps - Supine Pelvic Tilt  - 1 x daily - 7 x weekly - 1 sets - 10 reps - 5 sec hold  HOME EXERCISE PROGRAM: Medbridge [email or web address]  Access Code: [email or web address] URL: [email or web address] Date: 02/09/2022 Prepared by: Ethelene Browns  Exercises - Clamshell  - 1 x daily - 7 x weekly - 1-2 sets - 10 reps - 2-3 hold - Sidelying Hip Abduction  - 1 x daily - 7 x weekly - 1-2 sets -  10 reps - 2 hold  GOALS: Goals reviewed with patient? Yes  SHORT TERM GOALS: Target date: 03-12-22   PT Short Term Goals - 02/09/22 1945       PT SHORT TERM GOAL #1   Title Initiate aquatic therapy at [facility].; pt will amb. from car to pool area with RW with supervision with c/o Lt hip pain </= 5/10 after amb. this distance.    Time 3    Period Weeks    Status Ongoing - 03-09-22   Target Date 03/12/22      PT SHORT TERM GOAL #2   Title Pt will report decreased Lt hip pain to </= 4/10 with weight bearing.    Baseline pt reports Lt hip pain 7-8/10 with weight bearing    Time 3    Period Weeks    Status Ongoing - 03-09-22   Target Date 03/12/22      PT SHORT TERM GOAL #3   Title Pt will amb. 61' without device with SBA for increased household amb.    Baseline pt using SPC    Time 3    Period Weeks    Status Met - 03-09-22   Target Date 03/12/22      PT SHORT TERM GOAL #4   Title Independent in HEP for Lt hip musc. strengthening and ITB stretching.    Time 3    Period Weeks    Status Ongoing-  03-09-22 - pt has only had 1 land session since eval due to having had cytoscopy in early July   Target Date 03/12/22             LONG TERM GOALS: Target date: 04-09-22    PT Long Term Goals - 02/09/22 1952       PT LONG TERM GOAL #1   Title Independent in aquatic HEP to be continued at facility of pt's choice upon discharge from PT.    Time 6    Period Weeks    Status Ongoing   Target Date 04-02-22     PT LONG TERM GOAL #2   Title Pt will report reduced back/hip pain to </= 2/10 with weight bearing.    Baseline 4-5/10 reported with weight bearing on 04-13-22   Time 6    Period Weeks    Status Partially met; standing is worse than walking   Target Date 04/02/22      PT LONG TERM GOAL #3   Title Pt will perform sit to stand transfer with bil. UE support from chair with c/o pain </2/10 in low back and Lt hip.    Baseline 4-5/10 on 02-09-22    Time 6    Period Weeks    Status Goal met 04-13-22   Target Date 04/02/22      PT LONG TERM GOAL #4   Title Improve TUG score by at least 3 secs without assistive device to demo improved functional mobility.    Baseline 12.8 secs without use of device on 03-09-22   Time 6    Period Weeks    Status Deferred as score is WNL's on 03-09-22   Target Date 04/02/22      PT LONG TERM GOAL #5   Title Pt will tolerate lying supine for at least 1 hour for increased comfort with sleeping.    Baseline < 30" - 02-09-22    Time 6    Period Weeks    Status Met 04-13-22   Target Date 04/02/22  NEW LTG's - target date 05-14-22  PT Long Term Goals - 04/14/22 1736       PT LONG TERM GOAL #1   Title Independent in aquatic HEP to be continued at facility of pt's choice upon discharge from PT.    Time 4    Period Weeks    Status  Ongoing   Target Date 05/14/22      PT LONG TERM GOAL #2   Title Pt will report reduced back/hip pain to </= 3/10 with weight bearing.    Baseline 4-5/10 reported with weight bearing    Time 4    Period  Weeks    Status Revised    Target Date 05/14/22      PT LONG TERM GOAL #3   Title Pt will improve 5 times sit to stand score to </= 13 secs without UE support to demo improved strength in bil. LE's.    Baseline 15.34 secs without UE support    Time 4    Period Weeks    Status New    Target Date 05/14/22      PT LONG TERM GOAL #4   Title Pt will improve RLE SLS to >/= 7 secs and LLE SLS to >/= 4 secs for improved balance with gait.    Baseline RLE 5.53 secs, LLE 2.69 secs    Time 4    Period Weeks    Status New    Target Date 05/14/22                 ASSESSMENT:  CLINICAL IMPRESSION: Pt is progressing well with balance and LE strength, but continues to be limited by Rt knee pain and is unable to tolerate step up or step down exercise due to increased stress on Rt knee joint with these exercises.  Pt is able to amb. Modified independently without use of SPC for short distances, i.e. approx. 100' prior to onset of increased LBP.  Balance is improving with SLS activities on each leg.  Plan D/C next week due to progress achieved.   OBJECTIVE IMPAIRMENTS decreased balance, difficulty walking, decreased strength, postural dysfunction, and pain.   ACTIVITY LIMITATIONS carrying, bending, standing, squatting, sleeping, stairs, and locomotion level  PARTICIPATION LIMITATIONS: meal prep, cleaning, laundry, shopping, community activity, and yard work  PERSONAL FACTORS Past/current experiences, Time since onset of injury/illness/exacerbation, and 1-2 comorbidities: low back pain  are also affecting patient's functional outcome.   REHAB POTENTIAL: Good  CLINICAL DECISION MAKING: Evolving/moderate complexity  EVALUATION COMPLEXITY: Moderate  PLAN: PT FREQUENCY: 2x/week  PT DURATION: 4 weeks - plan for 4 additional land visits, 4 aquatic visits  PLANNED INTERVENTIONS: Therapeutic exercises, Therapeutic activity, Neuromuscular re-education, Balance training, Gait training,  Patient/Family education, Stair training, Aquatic Therapy, Cryotherapy, and Manual therapy  PLAN FOR NEXT SESSION:  cont with LE strengthening and gait & balance training   Tanae Petrosky, [place], PT 05/05/2022, 3:44 PM

## 2022-05-10 ENCOUNTER — Ambulatory Visit: Payer: Medicare Other | Admitting: Physical Therapy

## 2022-05-10 ENCOUNTER — Encounter: Payer: Self-pay | Admitting: Physical Therapy

## 2022-05-10 DIAGNOSIS — M6281 Muscle weakness (generalized): Secondary | ICD-10-CM | POA: Diagnosis not present

## 2022-05-10 DIAGNOSIS — R2689 Other abnormalities of gait and mobility: Secondary | ICD-10-CM | POA: Diagnosis not present

## 2022-05-10 DIAGNOSIS — R2681 Unsteadiness on feet: Secondary | ICD-10-CM

## 2022-05-10 NOTE — Therapy (Signed)
OUTPATIENT PHYSICAL THERAPY TREATMENT NOTE   Patient Name: Brandon [name] MRN: [id number] DOB:1945-09-02, 76 y.o., male Today's Date: 05/10/2022   PCP: Sharilyn Sites, MD REFERRING PROVIDER: Florinda Marker    PT End of Session - 05/10/22 1436     Visit Number 19    Number of Visits 21    Date for PT Re-Evaluation 05/14/22   pushed out due to scheduling   Authorization Type Medicare    Authorization Time Period 02-08-22 - 05-09-22; 04-13-22 - 05-14-22    PT Start Time 1315    PT Stop Time 0037    PT Time Calculation (min) 48 min    Equipment Utilized During Treatment Other (comment)   pool noodle, large bar bells, aquatic weight - 2# x 2   Activity Tolerance Patient tolerated treatment well    Behavior During Therapy [facility] for tasks assessed/performed              Past Medical History:  Diagnosis Date   Arthritis    Coronary atherosclerosis of native coronary artery    Multivessel status post CABG March 2015   DDD (degenerative disc disease), lumbar    Essential hypertension    GERD (gastroesophageal reflux disease)    Hard of hearing both ears    Left ear is worst   Hemorrhoids    History of gout    History of kidney stones    history of myasthenia Gravis 2021   History of [facility] spotted fever    Left ureteral  & renal stone    Lumbago    Mixed hyperlipidemia    Personal history of COVID-19 08/10/2021   Paxlovid   Plantar fascial fibromatosis    Postoperative atrial fibrillation (HCC)    Skin Cancer ([place])    Use of cane as ambulatory aid    Uses roller walker    UTI (urinary tract infection) 09/16/2021   Wears glasses    Past Surgical History:  Procedure Laterality Date   AMPUTATION Right 03/27/2014   Procedure: DEBRIDEMENT AND CLOSURE RIGHT INDEX FINGER;  Surgeon: [name], MD;  Location: [place];  Service: Orthopedics;  Laterality: Right;   BACK SURGERY     lower not sure when per wife on 10-01-2021   BICEPT TENODESIS  11/26/2011    Procedure: BICEPT TENODESIS;  Surgeon: Augustin Schooling, MD;  Location: [place];  Service: Orthopedics;  Laterality: Left;   BIOPSY  08/03/2018   Procedure: BIOPSY;  Surgeon: Rogene Houston, MD;  [facility];  Service: Endoscopy;;  antral   Cataract surgery Bilateral    yrs ago per wife on 10-01-2021   CHOLECYSTECTOMY N/A 09/22/2018   Procedure: LAPAROSCOPIC CHOLECYSTECTOMY;  Surgeon: Virl Cagey, MD;  [facility]: [facility];  Service: General;  Laterality: N/A;   COLONOSCOPY N/A 12/26/2014   Procedure: COLONOSCOPY;  Surgeon: Rogene Houston, MD;  [facility];  Service: Endoscopy;  Laterality: N/A;  730   CORONARY ARTERY BYPASS GRAFT N/A 10/19/2013   Procedure: CORONARY ARTERY BYPASS GRAFTING (CABG);  Surgeon: Rexene Alberts, MD;  Location: [place];  Service: Open Heart Surgery;  Laterality: N/A;  CABG times four using left internal mammary artery and left leg saphenous vein, incision made on right leg but no vein removed   CYSTOSCOPY W/ URETERAL STENT PLACEMENT Right 02/22/2022   Procedure: CYSTOSCOPY WITH RETROGRADE PYELOGRAM/URETERAL STENT PLACEMENT;  Surgeon: Ceasar Mons, MD;  Location: [facility];  Service: Urology;  Laterality: Right;  CYSTOSCOPY WITH RETROGRADE PYELOGRAM, URETEROSCOPY AND STENT PLACEMENT Left 08/24/2016   Procedure: CYSTOSCOPY WITH LEFT RETROGRADE PYELOGRAM, LEFT URETEROSCOPY, BASKET EXTRACTION LEFT URETERAL STONE;  Surgeon: Irine Seal, MD;  [facility];  Service: Urology;  Laterality: Left;   CYSTOSCOPY/URETEROSCOPY/HOLMIUM LASER/STENT PLACEMENT Left 10/06/2021   Procedure: CYSTOSCOPY LEFT RETROGRADE PYELOGRAM URETEROSCOPY/HOLMIUM LASER/STENT PLACEMENT;  Surgeon: Irine Seal, MD;  Location[facility];  Service: Urology;  Laterality: Left;   CYSTOSCOPY/URETEROSCOPY/HOLMIUM LASER/STENT PLACEMENT Right 03/01/2022   Procedure: CYSTOSCOPY RIGHT URETEROSCOPY/HOLMIUM LASER/STENT PLACEMENT;  Surgeon: Irine Seal, MD;  [facility];  Service: Urology;  Laterality: Right;   ESOPHAGOGASTRODUODENOSCOPY N/A 08/03/2018   Procedure: ESOPHAGOGASTRODUODENOSCOPY (EGD);  Surgeon: Rogene Houston, MD;  [facility];  Service: Endoscopy;  Laterality: N/A;  2:55   EXTRACORPOREAL SHOCK WAVE LITHOTRIPSY Right 06/18/2019   Procedure: EXTRACORPOREAL SHOCK WAVE LITHOTRIPSY (ESWL);  Surgeon: Lucas Mallow, MD;  [facility];  Service: Urology;  Laterality: Right;   INTRAOPERATIVE TRANSESOPHAGEAL ECHOCARDIOGRAM N/A 10/19/2013   Procedure: INTRAOPERATIVE TRANSESOPHAGEAL ECHOCARDIOGRAM;  Surgeon: Rexene Alberts, MD;  Location: [place];  Service: Open Heart Surgery;  Laterality: N/A;   KYPHOPLASTY N/A 03/27/2021   Procedure: Lumbar One and Lumbar Two Kyphoplasty;  Surgeon: Erline Levine, MD;  Location: [place];  Service: Neurosurgery;  Laterality: N/A;   left hand carpal tunnel release  04/04/2015   LEFT HEART CATHETERIZATION WITH CORONARY ANGIOGRAM N/A 10/09/2013   Procedure: LEFT HEART CATHETERIZATION WITH CORONARY ANGIOGRAM;  Surgeon: Burnell Blanks, MD;  Location: [facility] CATH [facility];  Service: Cardiovascular;  Laterality: N/A;   Left shoulder rotator cuff repair  12/04/2011   MOHS procedure  01/06/2021   dr Harl Bowie on left hand   ORIF PERIPROSTHETIC FRACTURE Left 03/19/2020   Procedure: OPEN REDUCTION INTERNAL FIXATION (ORIF) PERIPROSTHETIC FRACTURE LEFT FEMUR WITH FEMORAL REVISION;  Surgeon: Gaynelle Arabian, MD;  [facility];  Service: Orthopedics;  Laterality: Left;   TOTAL HIP ARTHROPLASTY Left 01/28/2020   Procedure: TOTAL HIP ARTHROPLASTY ANTERIOR APPROACH;  Surgeon: Gaynelle Arabian, MD;  [facility];  Service: Orthopedics;  Laterality: Left;  14mn   TOTAL KNEE ARTHROPLASTY Left 04/18/2001   traumaticvamputation of finger  2015   VASECTOMY     yrs ago per wife on 10-01-2021   Patient Active Problem List   Diagnosis Date Noted   Fracture, lumbar vertebra, compression ([facility] 03/27/2021   Atrial  fibrillation with RVR ([facility]    Periprosthetic fracture around internal prosthetic left hip joint ([facility] 03/19/2020   OA (osteoarthritis) of hip 01/28/2020   Closed displaced fracture of left femoral neck ([facility] 01/28/2020   History of colonic polyps 11/12/2019   GERD (gastroesophageal reflux disease) 11/06/2019   Myasthenia gravis with (acute) exacerbation ([facility] 11/06/2019   Acute diarrhea 09/17/2019   Heme positive stool 09/17/2019   Unspecified osteoarthritis, unspecified site    Hyperlipidemia, unspecified    Personal history of other diseases of the musculoskeletal system and connective tissue    Atherosclerotic heart disease of native coronary artery without angina pectoris    Hypertensive heart disease without heart failure    Melena    Plantar fascial fibromatosis    Pleurodynia    Calculus of gallbladder without cholecystitis without obstruction 09/14/2018   Nausea without vomiting 06/05/2018   Postoperative atrial fibrillation ([facility] 11/09/2013   S/P CABG x 4 10/19/2013   Coronary atherosclerosis of native coronary artery 10/18/2013   Essential hypertension, benign 09/18/2013   Family history of hypertrophic cardiomyopathy 09/18/2013  Mixed hyperlipidemia     ONSET DATE: 12-29-21 (date of L2-3 MED surgery)  REFERRING DIAG: Z61.096 (ICD-10-CM) - Presence of left artificial hip joint ; Lt trochanteric bursitis  THERAPY DIAG:  Muscle weakness (generalized)  Unsteadiness on feet  Other abnormalities of gait and mobility  Rationale for Evaluation and Treatment Rehabilitation  SUBJECTIVE:                                                                                                                                                                                              SUBJECTIVE STATEMENT: Pt reports he is doing well - went to his granddaughter's wedding this past weekend - took the cane but states he didn't use it all the time - did some walking without it   Pt  accompanied by:  wife, [name]  PERTINENT HISTORY: Mr. Rinck is a 76 year old male with PMH of Myasthenia Gravis, osteoporosis, CAD who presented to [facility] on 12/11/21 for initial neurosurgical evaluation of low back pain referred by Dr. Sharilyn Sites. He has a history of an L3-S1 fusion with Dr. Glenna Fellows in 2016 (retired) as well as L1/L2 kyphoplasty on 03/27/21 with Dr. Erline Levine.  He reported good relief with lumbar fusion until the last 3 years.  He was diagnosed with myasthenia gravis in 2021 and was placed on steroids.  He feels that this contributed to to his osteoporosis.  He reported low back pain that radiated to his left buttock and groin.  Pain was sharp rated as 10 out of 10 at its worst.  Pain was worse with lying, standing or walking.  He was taking hydrocodone prescribed by his PCP which does not really help.  He reported lower extremity weakness and lower extremity aching.  He had tried ESI with minimal relief.  He has a history of a left hip replacement. He is s/p left L2-3 MED on 12/29/21 with Dr. Linus Mako. [place]. Pt also has Myasthenia Gravis - diagnosed in Jan. 2021   PAIN:  Are you having pain? Yes low back and Lt hip Low back 2-3/10 with weight bearing; no pain in sitting;  No hip pain with weight bearing; no pain in sitting  Sitting helps the back pain; walking helps the hip pain - trying to walk 10'-15' 2-3 times a day Aggravating factors - weight bearing for back; pressure aggravates the hip  PRECAUTIONS: None  WEIGHT BEARING RESTRICTIONS No  FALLS: Has patient fallen in last 6 months? No  LIVING ENVIRONMENT: Lives with: lives with their family Lives in: House/apartment Stairs: Yes: External: 3 steps; on right going up Has  following equipment at home: Single point cane and Walker - 2 wheeled  PLOF: Independent  PATIENT GOALS decrease Lt hip and low back pain   OBJECTIVE: 05-10-22  Aquatic therapy at [facility] - pool temp. 92 degrees  Patient seen  for aquatic therapy today.  Treatment took place in water 3.5-4.5 feet deep depending upon activity.  Pt entered and exited the pool via step negotiation with use of hand rails modified independently.  Pt performed water walking forwards 18' x 4 reps, backwards 18' x 4 reps and sideways 18' x 4 reps each for warm up  Pt performed runner's stretch - for hamstring/gastroc stretch - bil. LE's 30 sec hold x 1 rep each leg;   Gastroc stretch bil. LE's with toes/forefeet on pool wall 30 sec hold x 1 rep   Marching in place 10 reps without UE support; 2nd set - marching in place - holding bar bell - contralateral movement of UE/LE  with 1-2 sec hold to facilitate improved SLS on each leg - 18' x 2 reps forward,18' x 2 reps backwards   Pt performed squats x 10 reps with bil. UE support on large pool noodle; unilateral squats each leg 10 reps with bil. UE support on pool wall  Standing hip abduction with use of 4.0# aquatic weight 15 reps bil. LE's; pt performed hip flexion and extension 15 reps each leg with UE support with use of aquatic weight 4.0# each leg;   hip abdct./adduction with knee flexed at 90 degrees with 4# weight 10 reps each leg  Pt performed crossovers front 18' x 4 reps, stepping behind 18' x 2 reps; braiding 18' x 2 reps with UE support on bar bells for assist with balance with these exercises   Sidestepping with squats with small bar bells for UE resistance and for increased core stabilization with exercise - 18' x 4 reps  Trunk rotation with wide BOS with use of yellow noodle 10 reps to each side for increased trunk and lumbar ROM 10 reps to each side   Pt performed closed chain strengthening exercise for hip extensor strengthening - pushing yellow noodle down toward floor 15 reps each leg; then knee flexion/extension 10 reps each leg with foot on yellow noodle for resistance  Ai Chi posture "Balancing" 10 reps each side with UE support on edge of pool prn for assist with balance    Pt requires buoyancy of water for support with standing balance and with gait training without UE support; buoyancy of water also needed for joint unloading for pain reduction with weight bearing; buoyancy also provides spinal decompression for reduced back pain in standing;  viscosity of water needed for resistance for strengthening bil. LE's:  water current provides perturbations for challenges with balance that cannot be safely performed on land          PATIENT EDUCATION: Education details: [facility] HEP [email or web address] - see above - issued on 03-09-22 Person educated: Patient and Spouse Education method: Customer service manager, handout Education comprehension: verbalized understanding   HOME EXERCISE PROGRAM: [facility] [email or web address]  Access Code: [email or web address] URL: [email or web address] Date: 02/09/2022 Prepared by: Ethelene Browns  Exercises - Clamshell  - 1 x daily - 7 x weekly - 1-2 sets - 10 reps - 2-3 hold - Sidelying Hip Abduction  - 1 x daily - 7 x weekly - 1-2 sets - 10 reps - 2 hold  GOALS: Goals reviewed with patient? Yes  SHORT TERM GOALS: Target date: 03-12-22   PT Short Term  Goals - 02/09/22 1945       PT SHORT TERM GOAL #1   Title Initiate aquatic therapy at [facility] pt will amb. from car to pool area with RW with supervision with c/o Lt hip pain </= 5/10 after amb. this distance.    Time 3    Period Weeks    Status Ongoing - 03-09-22   Target Date 03/12/22      PT SHORT TERM GOAL #2   Title Pt will report decreased Lt hip pain to </= 4/10 with weight bearing.    Baseline pt reports Lt hip pain 7-8/10 with weight bearing    Time 3    Period Weeks    Status Ongoing - 03-09-22   Target Date 03/12/22      PT SHORT TERM GOAL #3   Title Pt will amb. 43' without device with SBA for increased household amb.    Baseline pt using SPC    Time 3    Period Weeks    Status Met - 03-09-22   Target Date 03/12/22      PT SHORT TERM GOAL  #4   Title Independent in HEP for Lt hip musc. strengthening and ITB stretching.    Time 3    Period Weeks    Status Ongoing - pt has only had 1 land session since eval due to having had cytoscopy in early July   Target Date 03/12/22                    NEW LTG's - target date 05-14-22  PT Long Term Goals - 04/14/22 1736       PT LONG TERM GOAL #1   Title Independent in aquatic HEP to be continued at facility of pt's choice upon discharge from PT.    Time 4    Period Weeks    Status  Ongoing   Target Date 05/14/22      PT LONG TERM GOAL #2   Title Pt will report reduced back/hip pain to </= 3/10 with weight bearing.    Baseline 4-5/10 reported with weight bearing    Time 4    Period Weeks    Status Revised    Target Date 05/14/22      PT LONG TERM GOAL #3   Title Pt will improve 5 times sit to stand score to </= 13 secs without UE support to demo improved strength in bil. LE's.    Baseline 15.34 secs without UE support    Time 4    Period Weeks    Status New    Target Date 05/14/22      PT LONG TERM GOAL #4   Title Pt will improve RLE SLS to >/= 7 secs and LLE SLS to >/= 4 secs for improved balance with gait.    Baseline RLE 5.53 secs, LLE 2.69 secs    Time 4    Period Weeks    Status New    Target Date 05/14/22         ASSESSMENT:  CLINICAL IMPRESSION: Pt progressing with improving balance and gait without UE support in 4'0 water depth.  Weights for PRE's increased from use of 2.5# in previous aquatic therapy session last week to use of 4# weight in today's session.  Pt reports no significant back pain or Lt hip pain with any aquatic exercise in today's session.  Cont with POC.       OBJECTIVE IMPAIRMENTS decreased balance,  difficulty walking, decreased strength, postural dysfunction, and pain.   ACTIVITY LIMITATIONS carrying, bending, standing, squatting, sleeping, stairs, and locomotion level  PARTICIPATION LIMITATIONS: meal prep, cleaning, laundry,  shopping, community activity, and yard work  PERSONAL FACTORS Past/current experiences, Time since onset of injury/illness/exacerbation, and 1-2 comorbidities: low back pain  are also affecting patient's functional outcome.   REHAB POTENTIAL: Good  CLINICAL DECISION MAKING: Evolving/moderate complexity  EVALUATION COMPLEXITY: Moderate  PLAN: PT FREQUENCY: 2x/week  PT DURATION: 6 weeks - plan for 6 land visits, 6 aquatic visits  PLANNED INTERVENTIONS: Therapeutic exercises, Therapeutic activity, Neuromuscular re-education, Balance training, Gait training, Patient/Family education, Stair training, Aquatic Therapy, Cryotherapy, and Manual therapy  PLAN FOR NEXT SESSION:  cont with core strengthening, LLE strengthening    Kye Silverstein, [place], PT 05/10/2022, 6:56 PM

## 2022-05-11 ENCOUNTER — Ambulatory Visit: Payer: Medicare Other | Admitting: Physical Therapy

## 2022-05-11 DIAGNOSIS — M6281 Muscle weakness (generalized): Secondary | ICD-10-CM | POA: Diagnosis not present

## 2022-05-11 DIAGNOSIS — R2681 Unsteadiness on feet: Secondary | ICD-10-CM | POA: Diagnosis not present

## 2022-05-11 DIAGNOSIS — R2689 Other abnormalities of gait and mobility: Secondary | ICD-10-CM | POA: Diagnosis not present

## 2022-05-11 NOTE — Therapy (Signed)
OUTPATIENT PHYSICAL THERAPY NEURO TREATMENT NOTE/DISCHARGE SUMMARY   Progress Note Reporting Period 04-06-22 to 05-11-22  See note below for Objective Data and Assessment of Progress/Goals.      Patient Name: Brandon [name] MRN: [id number] DOB:1946/06/24, 76 y.o., male Today's Date: 05/12/2022   PCP: Sharilyn Sites, MD REFERRING PROVIDER: Florinda Marker    PT End of Session - 05/12/22 1027     Visit Number 20    Number of Visits 21    Date for PT Re-Evaluation 05/14/22   pushed out due to scheduling   Authorization Type Medicare    Authorization Time Period 02-08-22 - 05-09-22; 04-13-22 - 05-14-22    PT Start Time 0848    PT Stop Time 0932    PT Time Calculation (min) 44 min    Equipment Utilized During Treatment --   pool noodle, large bar bells, aquatic weight - 2# x 2   Activity Tolerance Patient tolerated treatment well    Behavior During Therapy [facility] for tasks assessed/performed                    Past Medical History:  Diagnosis Date   Arthritis    Coronary atherosclerosis of native coronary artery    Multivessel status post CABG March 2015   DDD (degenerative disc disease), lumbar    Essential hypertension    GERD (gastroesophageal reflux disease)    Hard of hearing both ears    Left ear is worst   Hemorrhoids    History of gout    History of kidney stones    history of myasthenia Gravis 2021   History of [facility] spotted fever    Left ureteral  & renal stone    Lumbago    Mixed hyperlipidemia    Personal history of COVID-19 08/10/2021   Paxlovid   Plantar fascial fibromatosis    Postoperative atrial fibrillation (HCC)    Skin Cancer ([place])    Use of cane as ambulatory aid    Uses roller walker    UTI (urinary tract infection) 09/16/2021   Wears glasses    Past Surgical History:  Procedure Laterality Date   AMPUTATION Right 03/27/2014   Procedure: DEBRIDEMENT AND CLOSURE RIGHT INDEX FINGER;  Surgeon: [name], MD;   Location: [place];  Service: Orthopedics;  Laterality: Right;   BACK SURGERY     lower not sure when per wife on 10-01-2021   BICEPT TENODESIS  11/26/2011   Procedure: BICEPT TENODESIS;  Surgeon: Augustin Schooling, MD;  Location: [place];  Service: Orthopedics;  Laterality: Left;   BIOPSY  08/03/2018   Procedure: BIOPSY;  Surgeon: Rogene Houston, MD;  [facility];  Service: Endoscopy;;  antral   Cataract surgery Bilateral    yrs ago per wife on 10-01-2021   CHOLECYSTECTOMY N/A 09/22/2018   Procedure: LAPAROSCOPIC CHOLECYSTECTOMY;  Surgeon: Virl Cagey, MD;  [facility]: [facility];  Service: General;  Laterality: N/A;   COLONOSCOPY N/A 12/26/2014   Procedure: COLONOSCOPY;  Surgeon: Rogene Houston, MD;  [facility];  Service: Endoscopy;  Laterality: N/A;  730   CORONARY ARTERY BYPASS GRAFT N/A 10/19/2013   Procedure: CORONARY ARTERY BYPASS GRAFTING (CABG);  Surgeon: Rexene Alberts, MD;  Location: [place];  Service: Open Heart Surgery;  Laterality: N/A;  CABG times four using left internal mammary artery and left leg saphenous vein, incision made on right leg but no vein removed   CYSTOSCOPY W/ URETERAL  STENT PLACEMENT Right 02/22/2022   Procedure: CYSTOSCOPY WITH RETROGRADE PYELOGRAM/URETERAL STENT PLACEMENT;  Surgeon: Ceasar Mons, MD;  [facility];  Service: Urology;  Laterality: Right;   CYSTOSCOPY WITH RETROGRADE PYELOGRAM, URETEROSCOPY AND STENT PLACEMENT Left 08/24/2016   Procedure: CYSTOSCOPY WITH LEFT RETROGRADE PYELOGRAM, LEFT URETEROSCOPY, BASKET EXTRACTION LEFT URETERAL STONE;  Surgeon: Irine Seal, MD;  [facility];  Service: Urology;  Laterality: Left;   CYSTOSCOPY/URETEROSCOPY/HOLMIUM LASER/STENT PLACEMENT Left 10/06/2021   Procedure: CYSTOSCOPY LEFT RETROGRADE PYELOGRAM URETEROSCOPY/HOLMIUM LASER/STENT PLACEMENT;  Surgeon: Irine Seal, MD;  Location: [facility];  Service: Urology;  Laterality: Left;    CYSTOSCOPY/URETEROSCOPY/HOLMIUM LASER/STENT PLACEMENT Right 03/01/2022   Procedure: CYSTOSCOPY RIGHT URETEROSCOPY/HOLMIUM LASER/STENT PLACEMENT;  Surgeon: Irine Seal, MD;  [facility];  Service: Urology;  Laterality: Right;   ESOPHAGOGASTRODUODENOSCOPY N/A 08/03/2018   Procedure: ESOPHAGOGASTRODUODENOSCOPY (EGD);  Surgeon: Rogene Houston, MD;  [facility];  Service: Endoscopy;  Laterality: N/A;  2:55   EXTRACORPOREAL SHOCK WAVE LITHOTRIPSY Right 06/18/2019   Procedure: EXTRACORPOREAL SHOCK WAVE LITHOTRIPSY (ESWL);  Surgeon: Lucas Mallow, MD;  [facility];  Service: Urology;  Laterality: Right;   INTRAOPERATIVE TRANSESOPHAGEAL ECHOCARDIOGRAM N/A 10/19/2013   Procedure: INTRAOPERATIVE TRANSESOPHAGEAL ECHOCARDIOGRAM;  Surgeon: Rexene Alberts, MD;  Location: [place];  Service: Open Heart Surgery;  Laterality: N/A;   KYPHOPLASTY N/A 03/27/2021   Procedure: Lumbar One and Lumbar Two Kyphoplasty;  Surgeon: Erline Levine, MD;  Location: [place];  Service: Neurosurgery;  Laterality: N/A;   left hand carpal tunnel release  04/04/2015   LEFT HEART CATHETERIZATION WITH CORONARY ANGIOGRAM N/A 10/09/2013   Procedure: LEFT HEART CATHETERIZATION WITH CORONARY ANGIOGRAM;  Surgeon: Burnell Blanks, MD;  Location: [facility] CATH LAB;  Service: Cardiovascular;  Laterality: N/A;   Left shoulder rotator cuff repair  12/04/2011   MOHS procedure  01/06/2021   dr Harl Bowie on left hand   ORIF PERIPROSTHETIC FRACTURE Left 03/19/2020   Procedure: OPEN REDUCTION INTERNAL FIXATION (ORIF) PERIPROSTHETIC FRACTURE LEFT FEMUR WITH FEMORAL REVISION;  Surgeon: Gaynelle Arabian, MD;  [facility];  Service: Orthopedics;  Laterality: Left;   TOTAL HIP ARTHROPLASTY Left 01/28/2020   Procedure: TOTAL HIP ARTHROPLASTY ANTERIOR APPROACH;  Surgeon: Gaynelle Arabian, MD;  [facility];  Service: Orthopedics;  Laterality: Left;  157mn   TOTAL KNEE ARTHROPLASTY Left 04/18/2001   traumaticvamputation of  finger  2015   VASECTOMY     yrs ago per wife on 10-01-2021   Patient Active Problem List   Diagnosis Date Noted   Fracture, lumbar vertebra, compression ([place] 03/27/2021   Atrial fibrillation with RVR ([place]    Periprosthetic fracture around internal prosthetic left hip joint ([place] 03/19/2020   OA (osteoarthritis) of hip 01/28/2020   Closed displaced fracture of left femoral neck ([place] 01/28/2020   History of colonic polyps 11/12/2019   GERD (gastroesophageal reflux disease) 11/06/2019   Myasthenia gravis with (acute) exacerbation ([place] 11/06/2019   Acute diarrhea 09/17/2019   Heme positive stool 09/17/2019   Unspecified osteoarthritis, unspecified site    Hyperlipidemia, unspecified    Personal history of other diseases of the musculoskeletal system and connective tissue    Atherosclerotic heart disease of native coronary artery without angina pectoris    Hypertensive heart disease without heart failure    Melena    Plantar fascial fibromatosis    Pleurodynia    Calculus of gallbladder without cholecystitis without obstruction 09/14/2018   Nausea without vomiting 06/05/2018   Postoperative atrial fibrillation ([place] 11/09/2013  S/P CABG x 4 10/19/2013   Coronary atherosclerosis of native coronary artery 10/18/2013   Essential hypertension, benign 09/18/2013   Family history of hypertrophic cardiomyopathy 09/18/2013   Mixed hyperlipidemia     ONSET DATE: 12-29-21 (date of L2-3 discectomy surgery)  REFERRING DIAG: F12.197 (ICD-10-CM) - Presence of left artificial hip joint ; Lt trochanteric bursitis  THERAPY DIAG:  Muscle weakness (generalized)  Unsteadiness on feet  Other abnormalities of gait and mobility  Rationale for Evaluation and Treatment Rehabilitation  SUBJECTIVE:                                                                                                                                                                                              SUBJECTIVE  STATEMENT:     Pt reports he is doing much better - says he didn't have much back pain at all this morning when he got up - usually he does have a lot of pain   Pt accompanied by:  wife, [name]  PERTINENT HISTORY: Mr. Majette is a 76 year old male with PMH of Myasthenia Gravis, osteoporosis, CAD who presented to [facility] on 12/11/21 for initial neurosurgical evaluation of low back pain referred by Dr. Sharilyn Sites. He has a history of an L3-S1 fusion with Dr. Glenna Fellows in 2016 (retired) as well as L1/L2 kyphoplasty on 03/27/21 with Dr. Erline Levine.  He reported good relief with lumbar fusion until the last 3 years.  He was diagnosed with myasthenia gravis in 2021 and was placed on steroids.  He feels that this contributed to to his osteoporosis.  He reported low back pain that radiated to his left buttock and groin.  Pain was sharp rated as 10 out of 10 at its worst.  Pain was worse with lying, standing or walking.  He was taking hydrocodone prescribed by his PCP which does not really help.  He reported lower extremity weakness and lower extremity aching.  He had tried ESI with minimal relief.  He has a history of a left hip replacement. He is s/p left L2-3 MED on 12/29/21 with Dr. Linus Mako. [place]. Pt also has Myasthenia Gravis - diagnosed in Jan. 2021   PAIN:  Are you having pain? No pain reported in sitting position Pt states he will have low back pain if he stands or walks for prolonged time period - rates intensity varying from 4-5/10 depending on duration of weight bearing   PRECAUTIONS: None  WEIGHT BEARING RESTRICTIONS No  FALLS: Has patient fallen in last 6 months? No  LIVING ENVIRONMENT: Lives with: lives with their family Lives in: House/apartment  Stairs: Yes: External: 3 steps; on right going up Has following equipment at home: Single point cane and Walker - 2 wheeled  PLOF: Independent  PATIENT GOALS decrease Lt hip and low back pain   OBJECTIVE:    05-11-22:  Exercises for bil. Hip and low back strengthening:   TherEx:   Exercises:  Runner's stretch bil. LE's at counter - 1 rep each leg - 30 sec hold each leg  Standing on inverted Bosu inside // bars - with bil. UE support - pt performed 10 small range squats  with bil. UE support Step ups forward onto 6" step LLE 10 reps  Leg press - 70# 3 sets 10 reps bil. LE's  Bil. LE's - heel raises with minimal UE support  Standing hip strengthening exercises with red theraband - forward, back and side kicks 5 reps x 2 sets each direction,  each leg   NeuroRe-ed:  Rockerboard - 10 reps 2 sets with UE support prn on // bars Stepping down to floor alternating feet with minimal UE support 10 reps each leg for improved SLS on each leg Standing on rockerboard - tapping 2 cones 3 reps straight ahead while standing on rockerboard; diagonal taps 3 reps with minimal UE support on // bars Standing on inverted Bosu inside // bars to have UE support prn - weight shifts anterior/posteriorly and laterally 10 reps each Standing on RLE - moving LLE slowly forward/back and then laterally 10 reps each; then performed same exercise with LLE SLS - moved RLE forward/back and out/in 10 reps each with UE support on // bars    PATIENT EDUCATION:   Revised HEP on 04-06-22 to include pelvic tilt and resisted hip strengthening with red theraband;  Medbridge (see below) Access Code: [email or web address] URL: [email or web address] Date: 04/07/2022 Prepared by: Ethelene Browns  Exercises - Standing Hip Flexion with Resistance Loop  - 1 x daily - 7 x weekly - 1 sets - 10 reps - 2-3 secs hold - Hip Abduction with Resistance Loop  - 1 x daily - 7 x weekly - 1 sets - 10 reps - 2-3 secs  hold - Hip Extension with Resistance Loop  - 1 x daily - 7 x weekly - 1 sets - 10 reps - Supine Pelvic Tilt  - 1 x daily - 7 x weekly - 1 sets - 10 reps - 5 sec hold    Education details: Medbridge HEP [email or web address] - see above - issued  on 03-09-22 Person educated: Patient and Spouse Education method: Explanation and Demonstration, handout Education comprehension: verbalized understanding Access Code: [email or web address] URL: [email or web address] Date: 04/07/2022 Prepared by: Ethelene Browns  Exercises - Standing Hip Flexion with Resistance Loop  - 1 x daily - 7 x weekly - 1 sets - 10 reps - 2-3 secs hold - Hip Abduction with Resistance Loop  - 1 x daily - 7 x weekly - 1 sets - 10 reps - 2-3 secs  hold - Hip Extension with Resistance Loop  - 1 x daily - 7 x weekly - 1 sets - 10 reps - Supine Pelvic Tilt  - 1 x daily - 7 x weekly - 1 sets - 10 reps - 5 sec hold  HOME EXERCISE PROGRAM: Medbridge [email or web address]  Access Code: [email or web address] URL: [email or web address] Date: 02/09/2022 Prepared by: Ethelene Browns  Exercises - Clamshell  - 1 x daily - 7 x weekly - 1-2 sets - 10 reps - 2-3 hold - Sidelying Hip Abduction  - 1 x daily - 7  x weekly - 1-2 sets - 10 reps - 2 hold  GOALS: Goals reviewed with patient? Yes  SHORT TERM GOALS: Target date: 03-12-22   PT Short Term Goals - 02/09/22 1945       PT SHORT TERM GOAL #1   Title Initiate aquatic therapy at [facility].; pt will amb. from car to pool area with RW with supervision with c/o Lt hip pain </= 5/10 after amb. this distance.    Time 3    Period Weeks    Status Ongoing - 03-09-22   Target Date 03/12/22      PT SHORT TERM GOAL #2   Title Pt will report decreased Lt hip pain to </= 4/10 with weight bearing.    Baseline pt reports Lt hip pain 7-8/10 with weight bearing    Time 3    Period Weeks    Status Ongoing - 03-09-22   Target Date 03/12/22      PT SHORT TERM GOAL #3   Title Pt will amb. 10' without device with SBA for increased household amb.    Baseline pt using SPC    Time 3    Period Weeks    Status Met - 03-09-22   Target Date 03/12/22      PT SHORT TERM GOAL #4   Title Independent in HEP for Lt hip musc. strengthening and  ITB stretching.    Time 3    Period Weeks    Status Ongoing- 03-09-22 - pt has only had 1 land session since eval due to having had cytoscopy in early July   Target Date 03/12/22             LONG TERM GOALS: Target date: 04-09-22    PT Long Term Goals - 02/09/22 1952       PT LONG TERM GOAL #1   Title Independent in aquatic HEP to be continued at facility of pt's choice upon discharge from PT.    Time 6    Period Weeks    Status Ongoing   Target Date 04-02-22     PT LONG TERM GOAL #2   Title Pt will report reduced back/hip pain to </= 2/10 with weight bearing.    Baseline 4-5/10 reported with weight bearing on 04-13-22   Time 6    Period Weeks    Status Partially met; standing is worse than walking   Target Date 04/02/22      PT LONG TERM GOAL #3   Title Pt will perform sit to stand transfer with bil. UE support from chair with c/o pain </2/10 in low back and Lt hip.    Baseline 4-5/10 on 02-09-22    Time 6    Period Weeks    Status Goal met 04-13-22   Target Date 04/02/22      PT LONG TERM GOAL #4   Title Improve TUG score by at least 3 secs without assistive device to demo improved functional mobility.    Baseline 12.8 secs without use of device on 03-09-22   Time 6    Period Weeks    Status Deferred as score is WNL's on 03-09-22   Target Date 04/02/22      PT LONG TERM GOAL #5   Title Pt will tolerate lying supine for at least 1 hour for increased comfort with sleeping.    Baseline < 30" - 02-09-22    Time 6    Period Weeks    Status Met 04-13-22  Target Date 04/02/22             NEW LTG's - target date 05-14-22  PT Long Term Goals - 04/14/22 1736       PT LONG TERM GOAL #1   Title Independent in aquatic HEP to be continued at facility of pt's choice upon discharge from PT.    Time 4    Period Weeks    Status  Goal met   Target Date 05/14/22      PT LONG TERM GOAL #2   Title Pt will report reduced back/hip pain to </= 3/10 with weight bearing.     Baseline 4-5/10 reported with weight bearing    Time 4    Period Weeks    Status Goal met - uses rollator with morning walks - rates pain 1-2/10 after amb. 1000'    Target Date 05/14/22      PT LONG TERM GOAL #3   Title Pt will improve 5 times sit to stand score to </= 13 secs without UE support to demo improved strength in bil. LE's.    Baseline 15.34 secs without UE support:  16.62 SECS on 05-11-22    Time 4    Period Weeks    Status Goal not met - Rt knee pain impacting this transfer    Target Date 05/14/22      PT LONG TERM GOAL #4   Title Pt will improve RLE SLS to >/= 7 secs and LLE SLS to >/= 4 secs for improved balance with gait.    Baseline RLE 2.3 secs, LLE 3.6 secs   Time 4    Period Weeks    Status Goal not met - 05-11-22   Target Date 05/14/22                 ASSESSMENT:  CLINICAL IMPRESSION: Pt has met LTG's #1 and 2;  LTG's # 3 and 4 not met due to Rt knee pain limiting ease and performance of sit to stand transfers (without UE support) and LTG #4 not met due to continued SLS deficits on each leg.  Pt reports no hip pain (resolution of Lt hip bursitis) and reports decreased low back pain overall.  Pt continues to report that he has the most back pain with static standing and also with prolonged ambulation.  Pt is using SPC for short community ambulation distances and using rollator prn with long distances, I.e. 1000' when walking in driveway at home.  Pt is able to amb. Short distances (200') safely without assistive device.   Pt is discharged due to completion of program and no further needs identified at this time.  Pt has maximized functional rehab potential at current time with excellent progress achieved in PT, including aquatic therapy.     OBJECTIVE IMPAIRMENTS decreased balance, difficulty walking, decreased strength, postural dysfunction, and pain.   ACTIVITY LIMITATIONS carrying, bending, standing, squatting, sleeping, stairs, and locomotion  level  PARTICIPATION LIMITATIONS: meal prep, cleaning, laundry, shopping, community activity, and yard work  PERSONAL FACTORS Past/current experiences, Time since onset of injury/illness/exacerbation, and 1-2 comorbidities: low back pain  are also affecting patient's functional outcome.   REHAB POTENTIAL: Good  CLINICAL DECISION MAKING: Evolving/moderate complexity  EVALUATION COMPLEXITY: Moderate  PLAN: PT FREQUENCY: 2x/week  PT DURATION: 4 weeks - plan for 4 additional land visits, 4 aquatic visits  PLANNED INTERVENTIONS: Therapeutic exercises, Therapeutic activity, Neuromuscular re-education, Balance training, Gait training, Patient/Family education, Stair training, Aquatic Therapy, Cryotherapy, and Manual therapy  PLAN FOR NEXT SESSION:  D/C 05-11-22   Alda Lea, PT 05/12/2022, 10:29 AM

## 2022-05-12 ENCOUNTER — Encounter: Payer: Self-pay | Admitting: Physical Therapy

## 2022-05-13 DIAGNOSIS — M25521 Pain in right elbow: Secondary | ICD-10-CM | POA: Diagnosis not present

## 2022-05-13 DIAGNOSIS — M19021 Primary osteoarthritis, right elbow: Secondary | ICD-10-CM | POA: Diagnosis not present

## 2022-05-13 DIAGNOSIS — M180 Bilateral primary osteoarthritis of first carpometacarpal joints: Secondary | ICD-10-CM | POA: Diagnosis not present

## 2022-05-13 DIAGNOSIS — M79645 Pain in left finger(s): Secondary | ICD-10-CM | POA: Diagnosis not present

## 2022-05-19 DIAGNOSIS — M170 Bilateral primary osteoarthritis of knee: Secondary | ICD-10-CM | POA: Diagnosis not present

## 2022-05-19 DIAGNOSIS — M25561 Pain in right knee: Secondary | ICD-10-CM | POA: Diagnosis not present

## 2022-05-26 DIAGNOSIS — E663 Overweight: Secondary | ICD-10-CM | POA: Diagnosis not present

## 2022-05-26 DIAGNOSIS — M5136 Other intervertebral disc degeneration, lumbar region: Secondary | ICD-10-CM | POA: Diagnosis not present

## 2022-05-26 DIAGNOSIS — B356 Tinea cruris: Secondary | ICD-10-CM | POA: Diagnosis not present

## 2022-05-26 DIAGNOSIS — Z6826 Body mass index (BMI) 26.0-26.9, adult: Secondary | ICD-10-CM | POA: Diagnosis not present

## 2022-06-09 DIAGNOSIS — R351 Nocturia: Secondary | ICD-10-CM | POA: Diagnosis not present

## 2022-06-09 DIAGNOSIS — R300 Dysuria: Secondary | ICD-10-CM | POA: Diagnosis not present

## 2022-06-09 DIAGNOSIS — N200 Calculus of kidney: Secondary | ICD-10-CM | POA: Diagnosis not present

## 2022-06-09 DIAGNOSIS — N401 Enlarged prostate with lower urinary tract symptoms: Secondary | ICD-10-CM | POA: Diagnosis not present

## 2022-06-24 DIAGNOSIS — M79676 Pain in unspecified toe(s): Secondary | ICD-10-CM | POA: Diagnosis not present

## 2022-06-24 DIAGNOSIS — B351 Tinea unguium: Secondary | ICD-10-CM | POA: Diagnosis not present

## 2022-06-24 DIAGNOSIS — L84 Corns and callosities: Secondary | ICD-10-CM | POA: Diagnosis not present

## 2022-06-24 DIAGNOSIS — I70203 Unspecified atherosclerosis of native arteries of extremities, bilateral legs: Secondary | ICD-10-CM | POA: Diagnosis not present

## 2022-06-25 DIAGNOSIS — I10 Essential (primary) hypertension: Secondary | ICD-10-CM | POA: Diagnosis not present

## 2022-06-25 DIAGNOSIS — E782 Mixed hyperlipidemia: Secondary | ICD-10-CM | POA: Diagnosis not present

## 2022-06-25 DIAGNOSIS — E7849 Other hyperlipidemia: Secondary | ICD-10-CM | POA: Diagnosis not present

## 2022-06-25 DIAGNOSIS — Z0001 Encounter for general adult medical examination with abnormal findings: Secondary | ICD-10-CM | POA: Diagnosis not present

## 2022-06-25 DIAGNOSIS — G7000 Myasthenia gravis without (acute) exacerbation: Secondary | ICD-10-CM | POA: Diagnosis not present

## 2022-06-25 DIAGNOSIS — Z23 Encounter for immunization: Secondary | ICD-10-CM | POA: Diagnosis not present

## 2022-06-25 DIAGNOSIS — I2510 Atherosclerotic heart disease of native coronary artery without angina pectoris: Secondary | ICD-10-CM | POA: Diagnosis not present

## 2022-06-25 DIAGNOSIS — E663 Overweight: Secondary | ICD-10-CM | POA: Diagnosis not present

## 2022-06-25 DIAGNOSIS — M810 Age-related osteoporosis without current pathological fracture: Secondary | ICD-10-CM | POA: Diagnosis not present

## 2022-06-25 DIAGNOSIS — Z1331 Encounter for screening for depression: Secondary | ICD-10-CM | POA: Diagnosis not present

## 2022-06-25 DIAGNOSIS — Z6825 Body mass index (BMI) 25.0-25.9, adult: Secondary | ICD-10-CM | POA: Diagnosis not present

## 2022-07-14 DIAGNOSIS — R1084 Generalized abdominal pain: Secondary | ICD-10-CM | POA: Diagnosis not present

## 2022-07-14 DIAGNOSIS — N3941 Urge incontinence: Secondary | ICD-10-CM | POA: Diagnosis not present

## 2022-07-14 DIAGNOSIS — N401 Enlarged prostate with lower urinary tract symptoms: Secondary | ICD-10-CM | POA: Diagnosis not present

## 2022-07-14 NOTE — Progress Notes (Unsigned)
Cardiology Office Note  Date: 07/15/2022   ID: Brandon [name], DOB 18-Feb-1946, MRN [id number]  PCP:  Sharilyn Sites, MD  Cardiologist:  Rozann Lesches, MD Electrophysiologist:  None   Chief Complaint  Patient presents with   Cardiac follow-up    History of Present Illness: Brandon [name] is a 76 y.o. male last seen in May.  He is here with his wife for a follow-up visit.  Reports no angina or nitroglycerin use, stable NYHA class II dyspnea.  States that he feels much better after lumbar spine surgery previously at [facility].  I reviewed his recent lab work as outlined below.  He reports compliance with his medications.  Most recent LDL was 63 and HDL 70.  Follow-up ischemic testing from February is reviewed below.  Past Medical History:  Diagnosis Date   Arthritis    Coronary atherosclerosis of native coronary artery    Multivessel status post CABG March 2015   DDD (degenerative disc disease), lumbar    Essential hypertension    GERD (gastroesophageal reflux disease)    Hard of hearing both ears    Left ear is worst   Hemorrhoids    History of gout    History of kidney stones    history of myasthenia Gravis 2021   History of [facility] spotted fever    Left ureteral  & renal stone    Lumbago    Mixed hyperlipidemia    Personal history of COVID-19 08/10/2021   Paxlovid   Plantar fascial fibromatosis    Postoperative atrial fibrillation (HCC)    Skin Cancer (HCC)    Use of cane as ambulatory aid    Uses roller walker    UTI (urinary tract infection) 09/16/2021   Wears glasses     Past Surgical History:  Procedure Laterality Date   AMPUTATION Right 03/27/2014   Procedure: DEBRIDEMENT AND CLOSURE RIGHT INDEX FINGER;  Surgeon: [name], MD;  Location: [facility];  Service: Orthopedics;  Laterality: Right;   BACK SURGERY     lower not sure when per wife on 10-01-2021   BACK SURGERY     BICEPT TENODESIS  11/26/2011   Procedure: BICEPT TENODESIS;   Surgeon: Augustin Schooling, MD;  Location: [place];  Service: Orthopedics;  Laterality: Left;   BIOPSY  08/03/2018   Procedure: BIOPSY;  Surgeon: Rogene Houston, MD;  [facility];  Service: Endoscopy;;  antral   Cataract surgery Bilateral    yrs ago per wife on 10-01-2021   CHOLECYSTECTOMY N/A 09/22/2018   Procedure: LAPAROSCOPIC CHOLECYSTECTOMY;  Surgeon: Virl Cagey, MD;  [facility]: [facility];  Service: General;  Laterality: N/A;   COLONOSCOPY N/A 12/26/2014   Procedure: COLONOSCOPY;  Surgeon: Rogene Houston, MD;  [facility];  Service: Endoscopy;  Laterality: N/A;  730   CORONARY ARTERY BYPASS GRAFT N/A 10/19/2013   Procedure: CORONARY ARTERY BYPASS GRAFTING (CABG);  Surgeon: Rexene Alberts, MD;  Location: [place];  Service: Open Heart Surgery;  Laterality: N/A;  CABG times four using left internal mammary artery and left leg saphenous vein, incision made on right leg but no vein removed   CYSTOSCOPY W/ URETERAL STENT PLACEMENT Right 02/22/2022   Procedure: CYSTOSCOPY WITH RETROGRADE PYELOGRAM/URETERAL STENT PLACEMENT;  Surgeon: Ceasar Mons, MD;  [facility];  Service: Urology;  Laterality: Right;   CYSTOSCOPY WITH RETROGRADE PYELOGRAM, URETEROSCOPY AND STENT PLACEMENT Left 08/24/2016   Procedure: CYSTOSCOPY WITH LEFT RETROGRADE PYELOGRAM, LEFT  URETEROSCOPY, BASKET EXTRACTION LEFT URETERAL STONE;  Surgeon: Irine Seal, MD;  [facility];  Service: Urology;  Laterality: Left;   CYSTOSCOPY/URETEROSCOPY/HOLMIUM LASER/STENT PLACEMENT Left 10/06/2021   Procedure: CYSTOSCOPY LEFT RETROGRADE PYELOGRAM URETEROSCOPY/HOLMIUM LASER/STENT PLACEMENT;  Surgeon: Irine Seal, MD;  [facility];  Service: Urology;  Laterality: Left;   CYSTOSCOPY/URETEROSCOPY/HOLMIUM LASER/STENT PLACEMENT Right 03/01/2022   Procedure: CYSTOSCOPY RIGHT URETEROSCOPY/HOLMIUM LASER/STENT PLACEMENT;  Surgeon: Irine Seal, MD;  [facility];  Service: Urology;   Laterality: Right;   ESOPHAGOGASTRODUODENOSCOPY N/A 08/03/2018   Procedure: ESOPHAGOGASTRODUODENOSCOPY (EGD);  Surgeon: Rogene Houston, MD;  [facility];  Service: Endoscopy;  Laterality: N/A;  2:55   EXTRACORPOREAL SHOCK WAVE LITHOTRIPSY Right 06/18/2019   Procedure: EXTRACORPOREAL SHOCK WAVE LITHOTRIPSY (ESWL);  Surgeon: Lucas Mallow, MD;  [facility];  Service: Urology;  Laterality: Right;   INTRAOPERATIVE TRANSESOPHAGEAL ECHOCARDIOGRAM N/A 10/19/2013   Procedure: INTRAOPERATIVE TRANSESOPHAGEAL ECHOCARDIOGRAM;  Surgeon: Rexene Alberts, MD;  Location: [place];  Service: Open Heart Surgery;  Laterality: N/A;   KYPHOPLASTY N/A 03/27/2021   Procedure: Lumbar One and Lumbar Two Kyphoplasty;  Surgeon: Erline Levine, MD;  Location: [facility];  Service: Neurosurgery;  Laterality: N/A;   left hand carpal tunnel release  04/04/2015   LEFT HEART CATHETERIZATION WITH CORONARY ANGIOGRAM N/A 10/09/2013   Procedure: LEFT HEART CATHETERIZATION WITH CORONARY ANGIOGRAM;  Surgeon: Burnell Blanks, MD;  Location: [facility];  Service: Cardiovascular;  Laterality: N/A;   Left shoulder rotator cuff repair  12/04/2011   MOHS procedure  01/06/2021   dr Harl Bowie on left hand   ORIF PERIPROSTHETIC FRACTURE Left 03/19/2020   Procedure: OPEN REDUCTION INTERNAL FIXATION (ORIF) PERIPROSTHETIC FRACTURE LEFT FEMUR WITH FEMORAL REVISION;  Surgeon: Gaynelle Arabian, MD;  [facility];  Service: Orthopedics;  Laterality: Left;   TOTAL HIP ARTHROPLASTY Left 01/28/2020   Procedure: TOTAL HIP ARTHROPLASTY ANTERIOR APPROACH;  Surgeon: Gaynelle Arabian, MD;  [facility];  Service: Orthopedics;  Laterality: Left;  175mn   TOTAL KNEE ARTHROPLASTY Left 04/18/2001   traumaticvamputation of finger  2015   VASECTOMY     yrs ago per wife on 10-01-2021    Current Outpatient Medications  Medication Sig Dispense Refill   allopurinol (ZYLOPRIM) 300 MG tablet Take 150 mg by mouth in the morning.      amLODipine (NORVASC) 5 MG tablet Take 1 tablet (5 mg total) by mouth daily. 30 tablet 1   Ascorbic Acid (VITAMIN C) 1000 MG tablet Take 1,000 mg by mouth in the morning.     aspirin EC 81 MG tablet Take 1 tablet (81 mg total) by mouth daily. Swallow whole. 90 tablet 3   Calcium Carb-Cholecalciferol (CALCIUM 600+D3 PO) Take 1 tablet by mouth in the morning and at bedtime.     HYDROcodone-acetaminophen (NORCO/VICODIN) 5-325 MG tablet Take 1 tablet by mouth every 4 (four) hours as needed (for pain). 30 tablet 0   metoprolol tartrate (LOPRESSOR) 25 MG tablet Take 1/2 (one-half) tablet by mouth twice daily (Patient taking differently: Take 12.5 mg by mouth in the morning.) 90 tablet 3   Misc Natural Products (OSTEO BI-FLEX ADV TRIPLE ST) TABS Take 1 tablet by mouth in the morning.     Multiple Vitamin (MULTIVITAMIN) tablet Take 1 tablet by mouth daily.     nitroGLYCERIN (NITROSTAT) 0.4 MG SL tablet Place 1 tablet (0.4 mg total) under the tongue every 5 (five) minutes as needed. (Patient taking differently: Place 0.4 mg under the tongue  every 5 (five) minutes as needed for chest pain.) 25 tablet 3   Omega-3 Fatty Acids (FISH OIL) 500 MG CAPS Take 500 mg by mouth in the morning.     oxybutynin (DITROPAN) 5 MG tablet Take 1 tablet (5 mg total) by mouth every 8 (eight) hours as needed for bladder spasms. 30 tablet 1   pantoprazole (PROTONIX) 40 MG tablet Take 1 tablet (40 mg total) by mouth daily. (Patient taking differently: Take 40 mg by mouth every morning.) 30 tablet 1   phenazopyridine (PYRIDIUM) 200 MG tablet Take 1 tablet (200 mg total) by mouth 3 (three) times daily as needed (for pain with urination). 30 tablet 0   pravastatin (PRAVACHOL) 40 MG tablet Take 40 mg by mouth every evening.      pyridostigmine (MESTINON) 60 MG tablet Take 0.5 tablets (30 mg total) by mouth 3 (three) times daily. (Patient taking differently: Take 30 mg by mouth in the morning and at bedtime.) 150 tablet 4   Vibegron  (GEMTESA PO) Take by mouth at bedtime.     No current facility-administered medications for this visit.   Allergies:  Gabapentin   ROS: No palpitations or syncope.  Physical Exam: VS:  BP (!) 142/88   Pulse (!) 56   Ht '6\' 1"'$  (1.854 m)   Wt 185 lb (83.9 kg)   SpO2 97%   BMI 24.41 kg/m , BMI Body mass index is 24.41 kg/m.  Wt Readings from Last 3 Encounters:  07/15/22 185 lb (83.9 kg)  02/25/22 180 lb (81.6 kg)  02/22/22 182 lb (82.6 kg)    General: Patient appears comfortable at rest. HEENT: Conjunctiva and lids normal. Neck: Supple, no elevated JVP or carotid bruits. Lungs: Clear to auscultation, nonlabored breathing at rest. Cardiac: Regular rate and rhythm, no S3 or significant systolic murmur. Extremities: No pitting edema.  ECG:  An ECG dated 01/04/2022 was personally reviewed today and demonstrated:  Sinus bradycardia with occasional PVCs, old inferior infarct pattern.  Recent Labwork: 08/10/2021: ALT 20; AST 22 02/22/2022: BUN 13; Creatinine, Ser 0.96; Hemoglobin 14.9; Platelets 265; Potassium 3.8; Sodium 138  November 2023: BUN 15, creatinine 0.96, potassium 4, AST 26, ALT 19, cholesterol 153, triglycerides 116, HDL 70, LDL 63, TSH 1.53  Other Studies Reviewed Today:  Lexiscan Myoview 10/05/2021:   Findings are consistent with small prior inferior/inferoseptal myocardial infarction with very mild peri-infarct ischemia. The study is low risk.   No ST deviation was noted.   There is a small mild intensity inferior/inferoseptal defect with very mild reversibility.   Left ventricular function is normal. Nuclear stress EF: 57 %. The left ventricular ejection fraction is normal (55-65%). End diastolic cavity size is normal.  Assessment and Plan:  1.  Multivessel CAD status post CABG in 2015.  He is symptomatically stable without angina or nitroglycerin use at this time.  Myoview earlier in the year was low risk showing small inferior/inferoseptal infarct scar with very  mild peri-infarct ischemia and normal LVEF.  Plan to continue observation for now.  He is on aspirin, Lopressor, Norvasc, Pravachol, and as needed nitroglycerin.  2.  Mixed hyperlipidemia, doing well on Pravachol with recent LDL 63.  3.  Essential hypertension, blood pressure up some today, typically has not been in this range.  No changes made to medical regimen at this time, I asked him to keep an eye on this with his PCP.  Medication Adjustments/Labs and Tests Ordered: Current medicines are reviewed at length with the patient today.  Concerns regarding medicines are outlined above.   Tests Ordered: No orders of the defined types were placed in this encounter.   Medication Changes: No orders of the defined types were placed in this encounter.   Disposition:  Follow up  6 months.  Signed, Satira Sark, MD, [facility] 07/15/2022 11:39 AM    [facility] [place]. [address], [place], [place] [address] Phone: [phone]; Fax: [phone]

## 2022-07-15 ENCOUNTER — Ambulatory Visit: Payer: Medicare Other | Attending: Cardiology | Admitting: Cardiology

## 2022-07-15 ENCOUNTER — Encounter: Payer: Self-pay | Admitting: Cardiology

## 2022-07-15 VITALS — BP 142/88 | HR 56 | Ht 73.0 in | Wt 185.0 lb

## 2022-07-15 DIAGNOSIS — E782 Mixed hyperlipidemia: Secondary | ICD-10-CM | POA: Diagnosis not present

## 2022-07-15 DIAGNOSIS — M1711 Unilateral primary osteoarthritis, right knee: Secondary | ICD-10-CM | POA: Diagnosis not present

## 2022-07-15 DIAGNOSIS — I10 Essential (primary) hypertension: Secondary | ICD-10-CM | POA: Diagnosis not present

## 2022-07-15 DIAGNOSIS — I25119 Atherosclerotic heart disease of native coronary artery with unspecified angina pectoris: Secondary | ICD-10-CM | POA: Diagnosis not present

## 2022-07-15 NOTE — Patient Instructions (Signed)
Medication Instructions:  Your physician recommends that you continue on your current medications as directed. Please refer to the Current Medication list given to you today.   Labwork: None today  Testing/Procedures: None today  Follow-Up: 6 months  Any Other Special Instructions Will Be Listed Below (If Applicable).  If you need a refill on your cardiac medications before your next appointment, please call your pharmacy.  

## 2022-07-22 DIAGNOSIS — M1711 Unilateral primary osteoarthritis, right knee: Secondary | ICD-10-CM | POA: Diagnosis not present

## 2022-07-29 DIAGNOSIS — M1711 Unilateral primary osteoarthritis, right knee: Secondary | ICD-10-CM | POA: Diagnosis not present

## 2022-08-02 DIAGNOSIS — U071 COVID-19: Secondary | ICD-10-CM | POA: Diagnosis not present

## 2022-08-02 DIAGNOSIS — Z6825 Body mass index (BMI) 25.0-25.9, adult: Secondary | ICD-10-CM | POA: Diagnosis not present

## 2022-08-02 DIAGNOSIS — E663 Overweight: Secondary | ICD-10-CM | POA: Diagnosis not present

## 2022-08-19 ENCOUNTER — Telehealth: Payer: Self-pay | Admitting: Diagnostic Neuroimaging

## 2022-08-19 NOTE — Telephone Encounter (Signed)
LVM informing pt of appointment change from 09/06/22 to 09/09/22 due to provider's template change

## 2022-08-24 DIAGNOSIS — M25511 Pain in right shoulder: Secondary | ICD-10-CM | POA: Diagnosis not present

## 2022-08-24 DIAGNOSIS — M25811 Other specified joint disorders, right shoulder: Secondary | ICD-10-CM | POA: Diagnosis not present

## 2022-08-25 ENCOUNTER — Telehealth: Payer: Self-pay | Admitting: *Deleted

## 2022-08-25 NOTE — Telephone Encounter (Signed)
  Patient Consent for Virtual Visit         Brandon [name] has provided verbal consent on 08/25/2022 for a virtual visit (video or telephone).   CONSENT FOR VIRTUAL VISIT FOR:  Brandon [name]  By participating in this virtual visit I agree to the following:  I hereby voluntarily request, consent and authorize [facility] and its employed or contracted physicians, physician assistants, nurse practitioners or other licensed health care professionals (the Practitioner), to provide me with telemedicine health care services (the "Services") as deemed necessary by the treating Practitioner. I acknowledge and consent to receive the Services by the Practitioner via telemedicine. I understand that the telemedicine visit will involve communicating with the Practitioner through live audiovisual communication technology and the disclosure of certain medical information by electronic transmission. I acknowledge that I have been given the opportunity to request an in-person assessment or other available alternative prior to the telemedicine visit and am voluntarily participating in the telemedicine visit.  I understand that I have the right to withhold or withdraw my consent to the use of telemedicine in the course of my care at any time, without affecting my right to future care or treatment, and that the Practitioner or I may terminate the telemedicine visit at any time. I understand that I have the right to inspect all information obtained and/or recorded in the course of the telemedicine visit and may receive copies of available information for a reasonable fee.  I understand that some of the potential risks of receiving the Services via telemedicine include:  Delay or interruption in medical evaluation due to technological equipment failure or disruption; Information transmitted may not be sufficient (e.g. poor resolution of images) to allow for appropriate medical decision making by the  Practitioner; and/or  In rare instances, security protocols could fail, causing a breach of personal health information.  Furthermore, I acknowledge that it is my responsibility to provide information about my medical history, conditions and care that is complete and accurate to the best of my ability. I acknowledge that Practitioner's advice, recommendations, and/or decision may be based on factors not within their control, such as incomplete or inaccurate data provided by me or distortions of diagnostic images or specimens that may result from electronic transmissions. I understand that the practice of medicine is not an exact science and that Practitioner makes no warranties or guarantees regarding treatment outcomes. I acknowledge that a copy of this consent can be made available to me via my patient portal ([facility]), or I can request a printed copy by calling the office of [facility].    I understand that my insurance will be billed for this visit.   I have read or had this consent read to me. I understand the contents of this consent, which adequately explains the benefits and risks of the Services being provided via telemedicine.  I have been provided ample opportunity to ask questions regarding this consent and the Services and have had my questions answered to my satisfaction. I give my informed consent for the services to be provided through the use of telemedicine in my medical care

## 2022-08-25 NOTE — Telephone Encounter (Signed)
   Pre-operative Risk Assessment    Patient Name: Brandon [name]  DOB: 06-Mar-1946 MRN: [id number]      Request for Surgical Clearance    Procedure:   Left reverse TSA  Date of Surgery:  Clearance TBD                                 Surgeon:  Dr. Esmond Plants Surgeon's Group or Practice Name:  [facility] Phone number:  [phone] ATTN: Jinny Blossom Fax number:  [phone]   Type of Clearance Requested:   - Medical    Type of Anesthesia:   Choice   Additional requests/questions:    Signed, Greer Ee   08/25/2022, 8:03 AM

## 2022-08-25 NOTE — Telephone Encounter (Signed)
   Name: Brandon [name]  DOB: Sep 30, 1945  MRN: [id number]  Primary Cardiologist: Rozann Lesches, MD  Chart reviewed as part of pre-operative protocol coverage. Because of [name] past medical history and time since last visit, he will require a follow-up telephone visit in order to better assess preoperative cardiovascular risk.  Pre-op covering staff: - Please schedule appointment and call patient to inform them. If patient already had an upcoming appointment within acceptable timeframe, please add "pre-op clearance" to the appointment notes so provider is aware. - Please contact requesting surgeon's office via preferred method (i.e, phone, fax) to inform them of need for appointment prior to surgery.  No medications indicated as needing held.  [name], PA-C  08/25/2022, 8:26 AM

## 2022-08-29 NOTE — Progress Notes (Unsigned)
Virtual Visit via Telephone Note   Because of Brandon [name]'s co-morbid illnesses, he is at least at moderate risk for complications without adequate follow up.  This format is felt to be most appropriate for this patient at this time.  The patient did not have access to video technology/had technical difficulties with video requiring transitioning to audio format only (telephone).  All issues noted in this document were discussed and addressed.  No physical exam could be performed with this format.  Please refer to the patient's chart for his consent to telehealth for [facility].  Evaluation Performed:  Preoperative cardiovascular risk assessment _____________   Date:  08/29/2022   Patient ID:  Brandon [name], DOB Jul 13, 1946, MRN [id number] Patient Location:  Home Provider location:   Office  Primary Care Provider:  Sharilyn Sites, MD Primary Cardiologist:  Rozann Lesches, MD  Chief Complaint / Patient Profile   77 y.o. y/o male with a h/o CAD s/p CABG 2015, HTN, GERD, HLD, arthritis who is pending left reverse total shoulder arthroplasty and presents today for telephonic preoperative cardiovascular risk assessment.  History of Present Illness    Brandon [name] is a 77 y.o. male who presents via audio/video conferencing for a telehealth visit today.  Pt was last seen in cardiology clinic on 07/15/2022 by Dr. Domenic Polite.  At that time Brandon [name] was doing well with no complaints of angina or nitroglycerin use..  The patient is now pending procedure as outlined above. Since his last visit, he ***  -No medications indicated to be held Past Medical History    Past Medical History:  Diagnosis Date   Arthritis    Coronary atherosclerosis of native coronary artery    Multivessel status post CABG March 2015   DDD (degenerative disc disease), lumbar    Essential hypertension    GERD (gastroesophageal reflux disease)    Hard of hearing both ears    Left ear is worst    Hemorrhoids    History of gout    History of kidney stones    history of myasthenia Gravis 2021   History of [facility] spotted fever    Left ureteral  & renal stone    Lumbago    Mixed hyperlipidemia    Personal history of COVID-19 08/10/2021   Paxlovid   Plantar fascial fibromatosis    Postoperative atrial fibrillation (HCC)    Skin Cancer (HCC)    Use of cane as ambulatory aid    Uses roller walker    UTI (urinary tract infection) 09/16/2021   Wears glasses    Past Surgical History:  Procedure Laterality Date   AMPUTATION Right 03/27/2014   Procedure: DEBRIDEMENT AND CLOSURE RIGHT INDEX FINGER;  Surgeon: [name], MD;  Location: [place];  Service: Orthopedics;  Laterality: Right;   BACK SURGERY     lower not sure when per wife on 10-01-2021   BACK SURGERY     BICEPT TENODESIS  11/26/2011   Procedure: BICEPT TENODESIS;  Surgeon: Augustin Schooling, MD;  Location: [place];  Service: Orthopedics;  Laterality: Left;   BIOPSY  08/03/2018   Procedure: BIOPSY;  Surgeon: Rogene Houston, MD;  [facility];  Service: Endoscopy;;  antral   Cataract surgery Bilateral    yrs ago per wife on 10-01-2021   CHOLECYSTECTOMY N/A 09/22/2018   Procedure: LAPAROSCOPIC CHOLECYSTECTOMY;  Surgeon: Virl Cagey, MD;  Location: [facility];  Service: General;  Laterality: N/A;  COLONOSCOPY N/A 12/26/2014   Procedure: COLONOSCOPY;  Surgeon: Rogene Houston, MD;  [facility];  Service: Endoscopy;  Laterality: N/A;  730   CORONARY ARTERY BYPASS GRAFT N/A 10/19/2013   Procedure: CORONARY ARTERY BYPASS GRAFTING (CABG);  Surgeon: Rexene Alberts, MD;  Location: [place];  Service: Open Heart Surgery;  Laterality: N/A;  CABG times four using left internal mammary artery and left leg saphenous vein, incision made on right leg but no vein removed   CYSTOSCOPY W/ URETERAL STENT PLACEMENT Right 02/22/2022   Procedure: CYSTOSCOPY WITH RETROGRADE PYELOGRAM/URETERAL STENT PLACEMENT;   Surgeon: Ceasar Mons, MD;  Location: [facility];  Service: Urology;  Laterality: Right;   CYSTOSCOPY WITH RETROGRADE PYELOGRAM, URETEROSCOPY AND STENT PLACEMENT Left 08/24/2016   Procedure: CYSTOSCOPY WITH LEFT RETROGRADE PYELOGRAM, LEFT URETEROSCOPY, BASKET EXTRACTION LEFT URETERAL STONE;  Surgeon: Irine Seal, MD;  Location: [facility];  Service: Urology;  Laterality: Left;   CYSTOSCOPY/URETEROSCOPY/HOLMIUM LASER/STENT PLACEMENT Left 10/06/2021   Procedure: CYSTOSCOPY LEFT RETROGRADE PYELOGRAM URETEROSCOPY/HOLMIUM LASER/STENT PLACEMENT;  Surgeon: Irine Seal, MD;  Location: [facility];  Service: Urology;  Laterality: Left;   CYSTOSCOPY/URETEROSCOPY/HOLMIUM LASER/STENT PLACEMENT Right 03/01/2022   Procedure: CYSTOSCOPY RIGHT URETEROSCOPY/HOLMIUM LASER/STENT PLACEMENT;  Surgeon: Irine Seal, MD;  [facility];  Service: Urology;  Laterality: Right;   ESOPHAGOGASTRODUODENOSCOPY N/A 08/03/2018   Procedure: ESOPHAGOGASTRODUODENOSCOPY (EGD);  Surgeon: Rogene Houston, MD;  [facility];  Service: Endoscopy;  Laterality: N/A;  2:55   EXTRACORPOREAL SHOCK WAVE LITHOTRIPSY Right 06/18/2019   Procedure: EXTRACORPOREAL SHOCK WAVE LITHOTRIPSY (ESWL);  Surgeon: Lucas Mallow, MD;  Location[facility];  Service: Urology;  Laterality: Right;   INTRAOPERATIVE TRANSESOPHAGEAL ECHOCARDIOGRAM N/A 10/19/2013   Procedure: INTRAOPERATIVE TRANSESOPHAGEAL ECHOCARDIOGRAM;  Surgeon: Rexene Alberts, MD;  Location: [place];  Service: Open Heart Surgery;  Laterality: N/A;   KYPHOPLASTY N/A 03/27/2021   Procedure: Lumbar One and Lumbar Two Kyphoplasty;  Surgeon: Erline Levine, MD;  Location: [place];  Service: Neurosurgery;  Laterality: N/A;   left hand carpal tunnel release  04/04/2015   LEFT HEART CATHETERIZATION WITH CORONARY ANGIOGRAM N/A 10/09/2013   Procedure: LEFT HEART CATHETERIZATION WITH CORONARY ANGIOGRAM;  Surgeon: Burnell Blanks, MD;  Location: [facility];  Service:  Cardiovascular;  Laterality: N/A;   Left shoulder rotator cuff repair  12/04/2011   MOHS procedure  01/06/2021   dr Harl Bowie on left hand   ORIF PERIPROSTHETIC FRACTURE Left 03/19/2020   Procedure: OPEN REDUCTION INTERNAL FIXATION (ORIF) PERIPROSTHETIC FRACTURE LEFT FEMUR WITH FEMORAL REVISION;  Surgeon: Gaynelle Arabian, MD;  Location[facility];  Service: Orthopedics;  Laterality: Left;   TOTAL HIP ARTHROPLASTY Left 01/28/2020   Procedure: TOTAL HIP ARTHROPLASTY ANTERIOR APPROACH;  Surgeon: Gaynelle Arabian, MD;  [facility];  Service: Orthopedics;  Laterality: Left;  159mn   TOTAL KNEE ARTHROPLASTY Left 04/18/2001   traumaticvamputation of finger  2015   VASECTOMY     yrs ago per wife on 10-01-2021    Allergies  Allergies  Allergen Reactions   Gabapentin Other (See Comments)    Hallucinations    Home Medications    Prior to Admission medications   Medication Sig Start Date End Date Taking? Authorizing Provider  allopurinol (ZYLOPRIM) 300 MG tablet Take 150 mg by mouth in the morning.    [provider]  amLODipine (NORVASC) 5 MG tablet Take 1 tablet (5 mg total) by mouth daily. 11/11/19   AShelly Coss MD  Ascorbic Acid (VITAMIN C) 1000  MG tablet Take 1,000 mg by mouth in the morning.    [provider]  aspirin EC 81 MG tablet Take 1 tablet (81 mg total) by mouth daily. Swallow whole. 06/27/20   Satira Sark, MD  Calcium Carb-Cholecalciferol (CALCIUM 600+D3 PO) Take 1 tablet by mouth in the morning and at bedtime.    [provider]  HYDROcodone-acetaminophen (NORCO/VICODIN) 5-325 MG tablet Take 1 tablet by mouth every 4 (four) hours as needed (for pain). 02/22/22   Ceasar Mons, MD  metoprolol tartrate (LOPRESSOR) 25 MG tablet Take 12.5 mg by mouth 2 (two) times daily.    [provider]  Misc Natural Products (OSTEO BI-FLEX ADV TRIPLE ST) TABS Take 1 tablet by mouth in the morning.    [provider]   Multiple Vitamin (MULTIVITAMIN) tablet Take 1 tablet by mouth daily.    [provider]  nitroGLYCERIN (NITROSTAT) 0.4 MG SL tablet Place 0.4 mg under the tongue every 5 (five) minutes as needed for chest pain.    [provider]  Omega-3 Fatty Acids (FISH OIL) 500 MG CAPS Take 500 mg by mouth in the morning.    [provider]  pravastatin (PRAVACHOL) 40 MG tablet Take 40 mg by mouth every evening.     [provider]  pyridostigmine (MESTINON) 60 MG tablet Take 0.5 tablets (30 mg total) by mouth 3 (three) times daily. 09/01/21 11/25/22  Penumalli, Earlean Polka, MD  Vibegron (GEMTESA PO) Take by mouth at bedtime.    [provider]    Physical Exam    Vital Signs:  Brandon [name] does not have vital signs available for review today.***  Given telephonic nature of communication, physical exam is limited. AAOx3. NAD. Normal affect.  Speech and respirations are unlabored.  Accessory Clinical Findings    None  Assessment & Plan    1.  Preoperative Cardiovascular Risk Assessment: -{Click Here to Calculate RCRI      :194174081}  { Click Here to Calculate DASI      :448185631} {Select to add RCRI Risk (<1%=LOW; >/=1%=HIGH) (Optional):21036017}  {Select if HIGH (RCRI >/=1%) Risk (Optional):21036030} Recommendations: {2014 ACC/AHA Perioperative Guidelines  :21036001} Antiplatelet and/or Anticoagulation Recommendations: {Antiplatelet Recommendations                  :21036016} {Anticoagulation Recommendations           :49702637}    The patient was advised that if he develops new symptoms prior to surgery to contact our office to arrange for a follow-up visit, and he verbalized understanding.  (Reminder: Include SBE prophylaxis/Antiplatelet/Anticoag Instructions***)  A copy of this note will be routed to requesting surgeon.  Time:   Today, I have spent *** minutes with the patient with telehealth technology discussing medical history, symptoms,  and management plan.     Mable Fill, Marissa Nestle, NP  08/29/2022, 8:47 PM

## 2022-08-30 ENCOUNTER — Ambulatory Visit: Payer: Medicare Other | Attending: Internal Medicine | Admitting: Nurse Practitioner

## 2022-08-30 DIAGNOSIS — Z01810 Encounter for preprocedural cardiovascular examination: Secondary | ICD-10-CM

## 2022-08-30 DIAGNOSIS — I700 Atherosclerosis of aorta: Secondary | ICD-10-CM | POA: Diagnosis not present

## 2022-08-30 DIAGNOSIS — N289 Disorder of kidney and ureter, unspecified: Secondary | ICD-10-CM | POA: Diagnosis not present

## 2022-08-30 DIAGNOSIS — N200 Calculus of kidney: Secondary | ICD-10-CM | POA: Diagnosis not present

## 2022-08-30 DIAGNOSIS — R300 Dysuria: Secondary | ICD-10-CM | POA: Diagnosis not present

## 2022-08-30 DIAGNOSIS — K5730 Diverticulosis of large intestine without perforation or abscess without bleeding: Secondary | ICD-10-CM | POA: Diagnosis not present

## 2022-09-03 DIAGNOSIS — H1132 Conjunctival hemorrhage, left eye: Secondary | ICD-10-CM | POA: Diagnosis not present

## 2022-09-06 ENCOUNTER — Ambulatory Visit: Payer: Medicare Other | Admitting: Diagnostic Neuroimaging

## 2022-09-09 ENCOUNTER — Ambulatory Visit: Payer: Medicare Other | Admitting: Diagnostic Neuroimaging

## 2022-09-09 ENCOUNTER — Other Ambulatory Visit: Payer: Self-pay | Admitting: Nurse Practitioner

## 2022-09-09 DIAGNOSIS — M1711 Unilateral primary osteoarthritis, right knee: Secondary | ICD-10-CM | POA: Diagnosis not present

## 2022-09-09 DIAGNOSIS — N281 Cyst of kidney, acquired: Secondary | ICD-10-CM

## 2022-09-14 NOTE — Telephone Encounter (Signed)
Duplicate clearance request was sent to our office. Pt was cleared 08/30/22 by Ambrose Pancoast, NP. Clearance notes and recommendations were faxed to surgeon office 08/30/22. I will re-fax notes again today.   Fax # [phone] attn: Vernie Ammons.

## 2022-09-16 ENCOUNTER — Ambulatory Visit (INDEPENDENT_AMBULATORY_CARE_PROVIDER_SITE_OTHER): Payer: Medicare Other | Admitting: Diagnostic Neuroimaging

## 2022-09-16 ENCOUNTER — Encounter: Payer: Self-pay | Admitting: Diagnostic Neuroimaging

## 2022-09-16 VITALS — BP 143/81 | HR 57 | Ht 73.0 in | Wt 184.0 lb

## 2022-09-16 DIAGNOSIS — G7000 Myasthenia gravis without (acute) exacerbation: Secondary | ICD-10-CM

## 2022-09-16 MED ORDER — PYRIDOSTIGMINE BROMIDE 60 MG PO TABS
30.0000 mg | ORAL_TABLET | Freq: Three times a day (TID) | ORAL | 4 refills | Status: DC
Start: 2022-09-16 — End: 2023-09-19

## 2022-09-16 NOTE — Patient Instructions (Signed)
GENERALIZED MYASTHENIA GRAVIS (stable) - continue pyridostigmine '30mg'$  2-3 times a day - continue vitamin D + calcium supplement

## 2022-09-16 NOTE — Progress Notes (Addendum)
[facility]  [name] DOB: 08/06/1946  REFERRING CLINICIAN: Sharilyn Sites, MD HISTORY FROM: patient and wife  REASON FOR VISIT: follow up    HISTORICAL  CHIEF COMPLAINT:  Chief Complaint  Patient presents with   Follow-up    Patient in room #6 with his wife. Pt here today to f/u.    HISTORY OF PRESENT ILLNESS:   UPDATE (09/16/22, VRP): Since last visit, doing well. Symptoms are stable. Severity  Tolerating meds.  No MG sxs.   UPDATE (09/01/21, VRP): Since last visit, now off prednisone. MG stable. No ptosis or generalized weakness. Still with significant low back pain and hip pain. Had L1-2 kyphoplasty in Aug 2022.   UPDATE (02/18/21, VRP): Since last visit, doing well from MG symptoms. However prednisone side effects (weight gain, irritable) are getting worse. Lumbar spinal stenosis is worsening, and maybe heading towards surgery with Dr. Vertell Limber.  UPDATE (08/20/20, VRP): Since last visit, doing well with myasthenia gravis --> symptoms are stable. Also seeing Dr. Maureen Ralphs, Dr. Rolena Infante, Dr. Nelva Bush for lumbar spinal stenosis. Had more low back pain (starting Aug 2021), few months of loose stools, frequent urination. No saddle anesthesia.  UPDATE (03/18/20, VRP): Since last visit, doing well from MG. Some side effects from prednisone (jittery, bloating). Symptoms are stable. Severity is mild. No alleviating or aggravating factors. Tolerating meds otherwise.   UPDATE (01/08/20, VRP): Since last visit, doing WELL. Symptoms are stable. No alleviating or aggravating factors. Tolerating meds. New left hip stress fracture since Dec 28, 2019. Using walker.   UPDATE (11/14/19, VRP): Since last visit, doing WELL; s/p IVIG in the hospital. Now on prednisone '60mg'$  daily. Symptoms are improved. No alleviating or aggravating factors. Tolerating meds.    UPDATE (10/31/19, VRP): Since last visit, doing poorly. Now with fatigue, SOB, hand weakness, leg weakness. Mestinon not  helping (even up to '90mg'$  three times a day).  PRIOR HPI (09/24/19): 77 year old male here for evaluation of myasthenia gravis.  September 10, 2019 patient noticed right eye ptosis, progressing to left eye ptosis.  He was having some stomach pain, diarrhea, gas, and breathing issues around the same time.  [name], had [place] R antibody testing which was positive and diagnosed with myasthenia gravis.  Around the same time patient was with a GI doctor, diagnosed with possible bacterial infection and treated with antibiotics.  He was also noted to be heme positive stool.  Since that time I symptoms are stable.  Symptoms are worse in the evening compared the morning.  [name].  Denies any shortness of breath.  Denies any speech or swallowing problems.  Eye drooping is severe enough that sometimes it obstructs his vision and he is not able to drive currently.   REVIEW OF SYSTEMS: Full 14 system review of systems performed and negative with exception of: as per HPI.    ALLERGIES: Allergies  Allergen Reactions   Gabapentin Other (See Comments)    Hallucinations    HOME MEDICATIONS: Outpatient Medications Prior to Visit  Medication Sig Dispense Refill   allopurinol (ZYLOPRIM) 300 MG tablet Take 150 mg by mouth in the morning.     amLODipine (NORVASC) 5 MG tablet Take 1 tablet (5 mg total) by mouth daily. 30 tablet 1   Ascorbic Acid (VITAMIN C) 1000 MG tablet Take 1,000 mg by mouth in the morning.     aspirin EC 81 MG tablet Take 1 tablet (81 mg total)  by mouth daily. Swallow whole. 90 tablet 3   Calcium Carb-Cholecalciferol (CALCIUM 600+D3 PO) Take 1 tablet by mouth in the morning and at bedtime.     HYDROcodone-acetaminophen (NORCO/VICODIN) 5-325 MG tablet Take 1 tablet by mouth every 4 (four) hours as needed (for pain). 30 tablet 0   metoprolol tartrate (LOPRESSOR) 25 MG tablet Take 12.5 mg by mouth 2 (two) times daily.     Misc Natural Products  (OSTEO BI-FLEX ADV TRIPLE ST) TABS Take 1 tablet by mouth in the morning.     Multiple Vitamin (MULTIVITAMIN) tablet Take 1 tablet by mouth daily.     nitroGLYCERIN (NITROSTAT) 0.4 MG SL tablet Place 0.4 mg under the tongue every 5 (five) minutes as needed for chest pain.     Omega-3 Fatty Acids (FISH OIL) 500 MG CAPS Take 500 mg by mouth in the morning.     pravastatin (PRAVACHOL) 40 MG tablet Take 40 mg by mouth every evening.      Vibegron (GEMTESA PO) Take by mouth at bedtime.     pyridostigmine (MESTINON) 60 MG tablet Take 0.5 tablets (30 mg total) by mouth 3 (three) times daily. 150 tablet 4   No facility-administered medications prior to visit.    PAST MEDICAL HISTORY: Past Medical History:  Diagnosis Date   Arthritis    Coronary atherosclerosis of native coronary artery    Multivessel status post CABG March 2015   DDD (degenerative disc disease), lumbar    Essential hypertension    GERD (gastroesophageal reflux disease)    Hard of hearing both ears    Left ear is worst   Hemorrhoids    History of gout    History of kidney stones    history of myasthenia Gravis 2021   History of [facility] spotted fever    Left ureteral  & renal stone    Lumbago    Mixed hyperlipidemia    Personal history of COVID-19 08/10/2021   Paxlovid   Plantar fascial fibromatosis    Postoperative atrial fibrillation (HCC)    Skin Cancer (HCC)    Use of cane as ambulatory aid    Uses roller walker    UTI (urinary tract infection) 09/16/2021   Wears glasses     PAST SURGICAL HISTORY: Past Surgical History:  Procedure Laterality Date   AMPUTATION Right 03/27/2014   Procedure: DEBRIDEMENT AND CLOSURE RIGHT INDEX FINGER;  Surgeon: [name], MD;  Location: [place];  Service: Orthopedics;  Laterality: Right;   BACK SURGERY     lower not sure when per wife on 10-01-2021   BACK SURGERY     BICEPT TENODESIS  11/26/2011   Procedure: BICEPT TENODESIS;  Surgeon: Augustin Schooling, MD;  Location:  [place];  Service: Orthopedics;  Laterality: Left;   BIOPSY  08/03/2018   Procedure: BIOPSY;  Surgeon: Rogene Houston, MD;  [facility];  Service: Endoscopy;;  antral   Cataract surgery Bilateral    yrs ago per wife on 10-01-2021   CHOLECYSTECTOMY N/A 09/22/2018   Procedure: LAPAROSCOPIC CHOLECYSTECTOMY;  Surgeon: Virl Cagey, MD;  [facility]: [facility];  Service: General;  Laterality: N/A;   COLONOSCOPY N/A 12/26/2014   Procedure: COLONOSCOPY;  Surgeon: Rogene Houston, MD;  [facility];  Service: Endoscopy;  Laterality: N/A;  730   CORONARY ARTERY BYPASS GRAFT N/A 10/19/2013   Procedure: CORONARY ARTERY BYPASS GRAFTING (CABG);  Surgeon: Rexene Alberts, MD;  Location: [place];  Service: Open Heart  Surgery;  Laterality: N/A;  CABG times four using left internal mammary artery and left leg saphenous vein, incision made on right leg but no vein removed   CYSTOSCOPY W/ URETERAL STENT PLACEMENT Right 02/22/2022   Procedure: CYSTOSCOPY WITH RETROGRADE PYELOGRAM/URETERAL STENT PLACEMENT;  Surgeon: Ceasar Mons, MD;  [facility]: [facility];  Service: Urology;  Laterality: Right;   CYSTOSCOPY WITH RETROGRADE PYELOGRAM, URETEROSCOPY AND STENT PLACEMENT Left 08/24/2016   Procedure: CYSTOSCOPY WITH LEFT RETROGRADE PYELOGRAM, LEFT URETEROSCOPY, BASKET EXTRACTION LEFT URETERAL STONE;  Surgeon: Irine Seal, MD;  [facility];  Service: Urology;  Laterality: Left;   CYSTOSCOPY/URETEROSCOPY/HOLMIUM LASER/STENT PLACEMENT Left 10/06/2021   Procedure: CYSTOSCOPY LEFT RETROGRADE PYELOGRAM URETEROSCOPY/HOLMIUM LASER/STENT PLACEMENT;  Surgeon: Irine Seal, MD;  Location: [facility];  Service: Urology;  Laterality: Left;   CYSTOSCOPY/URETEROSCOPY/HOLMIUM LASER/STENT PLACEMENT Right 03/01/2022   Procedure: CYSTOSCOPY RIGHT URETEROSCOPY/HOLMIUM LASER/STENT PLACEMENT;  Surgeon: Irine Seal, MD;  [facility];  Service: Urology;  Laterality: Right;    ESOPHAGOGASTRODUODENOSCOPY N/A 08/03/2018   Procedure: ESOPHAGOGASTRODUODENOSCOPY (EGD);  Surgeon: Rogene Houston, MD;  [facility];  Service: Endoscopy;  Laterality: N/A;  2:55   EXTRACORPOREAL SHOCK WAVE LITHOTRIPSY Right 06/18/2019   Procedure: EXTRACORPOREAL SHOCK WAVE LITHOTRIPSY (ESWL);  Surgeon: Lucas Mallow, MD;  [facility];  Service: Urology;  Laterality: Right;   INTRAOPERATIVE TRANSESOPHAGEAL ECHOCARDIOGRAM N/A 10/19/2013   Procedure: INTRAOPERATIVE TRANSESOPHAGEAL ECHOCARDIOGRAM;  Surgeon: Rexene Alberts, MD;  Location: [place];  Service: Open Heart Surgery;  Laterality: N/A;   KYPHOPLASTY N/A 03/27/2021   Procedure: Lumbar One and Lumbar Two Kyphoplasty;  Surgeon: Erline Levine, MD;  Location: [place];  Service: Neurosurgery;  Laterality: N/A;   left hand carpal tunnel release  04/04/2015   LEFT HEART CATHETERIZATION WITH CORONARY ANGIOGRAM N/A 10/09/2013   Procedure: LEFT HEART CATHETERIZATION WITH CORONARY ANGIOGRAM;  Surgeon: Burnell Blanks, MD;  Location: [facility];  Service: Cardiovascular;  Laterality: N/A;   Left shoulder rotator cuff repair  12/04/2011   MOHS procedure  01/06/2021   dr Harl Bowie on left hand   ORIF PERIPROSTHETIC FRACTURE Left 03/19/2020   Procedure: OPEN REDUCTION INTERNAL FIXATION (ORIF) PERIPROSTHETIC FRACTURE LEFT FEMUR WITH FEMORAL REVISION;  Surgeon: Gaynelle Arabian, MD;  [facility];  Service: Orthopedics;  Laterality: Left;   TOTAL HIP ARTHROPLASTY Left 01/28/2020   Procedure: TOTAL HIP ARTHROPLASTY ANTERIOR APPROACH;  Surgeon: Gaynelle Arabian, MD;  [facility];  Service: Orthopedics;  Laterality: Left;  163mn   TOTAL KNEE ARTHROPLASTY Left 04/18/2001   traumaticvamputation of finger  2015   VASECTOMY     yrs ago per wife on 10-01-2021    FAMILY HISTORY: Family History  Problem Relation Age of Onset   Hypertension Sister    Lung cancer Father    Heart failure Mother     SOCIAL HISTORY: Social  History   Socioeconomic History   Marital status: Married    Spouse name: [name]   Number of children: 2   Years of education: Not on file   Highest education level: 10th grade  Occupational History   Occupation: [employer]  Tobacco Use   Smoking status: Former    Packs/day: 2.00    Years: 30.00    Total pack years: 60.00    Types: Cigarettes    Start date: 08/16/1958    Quit date: 08/17/1983    Years since quitting: 39.1   Smokeless tobacco: Former    Types: Chew    Quit date:  1985   Tobacco comments:    quit smoking 68yr ago  Vaping Use   Vaping Use: Never used  Substance and Sexual Activity   Alcohol use: Not Currently   Drug use: No   Sexual activity: Not on file  Other Topics Concern   Not on file  Social History Narrative   Lives with wife   Caffeine- coffee 3- 4 c daily   Social Determinants of Health   Financial Resource Strain: Low Risk  (04/21/2022)   Overall Financial Resource Strain (CARDIA)    Difficulty of Paying Living Expenses: Not hard at all  Food Insecurity: Not on file  Transportation Needs: No Transportation Needs (04/21/2022)   PRAPARE - THydrologist(Medical): No    Lack of Transportation (Non-Medical): No  Physical Activity: Not on file  Stress: Not on file  Social Connections: Not on file  Intimate Partner Violence: Not on file     PHYSICAL EXAM  GENERAL EXAM/CONSTITUTIONAL: Vitals:  Vitals:   09/16/22 1505  BP: (!) 143/81  Pulse: (!) 57  Weight: 184 lb (83.5 kg)  Height: '6\' 1"'$  (1.854 m)   Body mass index is 24.28 kg/m. Wt Readings from Last 10 Encounters:  09/16/22 184 lb (83.5 kg)  07/15/22 185 lb (83.9 kg)  02/25/22 180 lb (81.6 kg)  02/22/22 182 lb (82.6 kg)  01/04/22 182 lb 3.2 oz (82.6 kg)  10/06/21 182 lb 11.2 oz (82.9 kg)  09/01/21 189 lb 12.8 oz (86.1 kg)  08/10/21 200 lb (90.7 kg)  07/21/21 193 lb 9.6 oz (87.8 kg)  03/27/21 220 lb (99.8 kg)   Patient is in no distress; well  developed, nourished and groomed; neck is supple MOON FACIES  CARDIOVASCULAR: Examination of carotid arteries is normal; no carotid bruits Regular rate and rhythm, no murmurs Examination of peripheral vascular system by observation and palpation is normal  EYES: Ophthalmoscopic exam of optic discs and posterior segments is normal; no papilledema or hemorrhages No results found.  MUSCULOSKELETAL: Gait, strength, tone, movements noted in Neurologic exam below  NEUROLOGIC: MENTAL STATUS:      No data to display         awake, alert, oriented to person, place and time recent and remote memory intact normal attention and concentration language fluent, comprehension intact, naming intact fund of knowledge appropriate  CRANIAL NERVE:  2nd - no papilledema on fundoscopic exam 2nd, 3rd, 4th, 6th - pupils equal and reactive to light, visual fields full to confrontation, extraocular muscles intact, no nystagmus 5th - facial sensation symmetric 7th - facial strength symmetric 8th - hearing --> REDUCED 9th - palate elevates symmetrically, uvula midline 11th - shoulder shrug symmetric 12th - tongue protrusion midline   MOTOR:  normal bulk and tone BUE 5; BLE --> RIGHT HF 4+, LEFT HF 3, LEFT DF 4  SENSORY:  normal and symmetric to light touch, temperature, vibration  COORDINATION:  finger-nose-finger, fine finger movements normal  REFLEXES:  deep tendon reflexes TRACE and symmetric  GAIT/STATION:  narrow based gait; USING WALKER     DIAGNOSTIC DATA (LABS, IMAGING, TESTING) - I reviewed patient records, labs, notes, testing and imaging myself where available.  Lab Results  Component Value Date   WBC 9.2 02/22/2022   HGB 14.9 02/22/2022   HCT 46.3 02/22/2022   MCV 89.9 02/22/2022   PLT 265 02/22/2022      Component Value Date/Time   NA 138 02/22/2022 1623   NA 138 06/28/2019 0000  K 3.8 02/22/2022 1623   CL 106 02/22/2022 1623   CO2 24 02/22/2022 1623    GLUCOSE 97 02/22/2022 1623   BUN 13 02/22/2022 1623   BUN 20 06/28/2019 0000   CREATININE 0.96 02/22/2022 1623   CALCIUM 9.7 02/22/2022 1623   PROT 7.1 08/10/2021 2125   ALBUMIN 3.6 08/10/2021 2125   AST 22 08/10/2021 2125   ALT 20 08/10/2021 2125   ALKPHOS 106 08/10/2021 2125   BILITOT 1.2 08/10/2021 2125   GFRNONAA >60 02/22/2022 1623   GFRAA >60 03/19/2020 1218   Lab Results  Component Value Date   CHOL 134 06/28/2019   HDL 49 06/28/2019   LDLCALC 62 06/28/2019   TRIG 157 06/28/2019   CHOLHDL 2.4 12/06/2013   Lab Results  Component Value Date   HGBA1C 6.0 (H) 03/20/2020   No results found for: "VITAMINB12" Lab Results  Component Value Date   TSH 0.981 03/21/2020    09/17/19 AchR ab - 42 HIGH  10/15/19 CT chest 1. No thymic mass or thymoma. 2.  Aortic Atherosclerosis (ICD10-I70.0). 3. 4.5 mm left lower lobe pulmonary nodule. This is unchanged compared with 04/12/2018. No follow-up needed if patient is low-risk. Non-contrast chest CT can be considered in 12 months if patient is high-risk. This recommendation follows the consensus statement: Guidelines for Management of Incidental Pulmonary Nodules Detected on CT Images: From the Fleischner Society 2017; Radiology 2017; 202:542-706.  07/10/09 MRI lumbar spine 1.  Moderately severe spinal stenosis at L4-5 due to severe  bilateral facet joint arthritis and a 6 mm spondylolisthesis of L4  on L5.  2.  Diffuse bulging of the uncovered L4-5 disc with a herniation of  disc material into the right neural foramen compressing the right  L4 nerve root under the right pedicle.  3.  Severe right lateral recess stenosis at L4-5.  The right L5  nerve root appears compressed by the posterior element hypertrophy  in the right lateral recess.   03/20/21 MRI lumbar spine 1. Acute or subacute L1 and L2 compression fractures. 2. Slight progression of severe spinal and left lateral recess stenosis at L2-3.   ASSESSMENT AND  PLAN  77 y.o. year old male here with new onset ptosis, blurred vision, generalized weakness with positive ACH antibody, consistent with generalized myasthenia gravis.    Dx:  1. Myasthenia gravis without (acute) exacerbation (HCC)       PLAN:  GENERALIZED MYASTHENIA GRAVIS (stable) - continue pyridostigmine '30mg'$  2-3 times a day - continue vitamin D + calcium supplement  LUMBAR SPINAL STENOSIS - improved; monitor  Meds ordered this encounter  Medications   pyridostigmine (MESTINON) 60 MG tablet    Sig: Take 0.5 tablets (30 mg total) by mouth 3 (three) times daily.    Dispense:  135 tablet    Refill:  4   Return in about 1 year (around 09/17/2023) for MyChart visit (15 min).    Penni Bombard, MD 09/18/7626, 3:15 PM Certified in Neurology, Neurophysiology and Neuroimaging  [facility] [address] [place], [place] [address] [phone]

## 2022-09-20 DIAGNOSIS — M1991 Primary osteoarthritis, unspecified site: Secondary | ICD-10-CM | POA: Diagnosis not present

## 2022-09-20 DIAGNOSIS — I2510 Atherosclerotic heart disease of native coronary artery without angina pectoris: Secondary | ICD-10-CM | POA: Diagnosis not present

## 2022-09-20 DIAGNOSIS — M810 Age-related osteoporosis without current pathological fracture: Secondary | ICD-10-CM | POA: Diagnosis not present

## 2022-09-20 DIAGNOSIS — Z6826 Body mass index (BMI) 26.0-26.9, adult: Secondary | ICD-10-CM | POA: Diagnosis not present

## 2022-09-20 DIAGNOSIS — E782 Mixed hyperlipidemia: Secondary | ICD-10-CM | POA: Diagnosis not present

## 2022-09-20 DIAGNOSIS — G7000 Myasthenia gravis without (acute) exacerbation: Secondary | ICD-10-CM | POA: Diagnosis not present

## 2022-09-20 DIAGNOSIS — I10 Essential (primary) hypertension: Secondary | ICD-10-CM | POA: Diagnosis not present

## 2022-09-20 DIAGNOSIS — E119 Type 2 diabetes mellitus without complications: Secondary | ICD-10-CM | POA: Diagnosis not present

## 2022-09-20 DIAGNOSIS — Z0001 Encounter for general adult medical examination with abnormal findings: Secondary | ICD-10-CM | POA: Diagnosis not present

## 2022-09-20 DIAGNOSIS — M5136 Other intervertebral disc degeneration, lumbar region: Secondary | ICD-10-CM | POA: Diagnosis not present

## 2022-09-20 DIAGNOSIS — E663 Overweight: Secondary | ICD-10-CM | POA: Diagnosis not present

## 2022-09-20 DIAGNOSIS — E7849 Other hyperlipidemia: Secondary | ICD-10-CM | POA: Diagnosis not present

## 2022-09-21 DIAGNOSIS — L84 Corns and callosities: Secondary | ICD-10-CM | POA: Diagnosis not present

## 2022-09-21 DIAGNOSIS — B351 Tinea unguium: Secondary | ICD-10-CM | POA: Diagnosis not present

## 2022-09-21 DIAGNOSIS — I70203 Unspecified atherosclerosis of native arteries of extremities, bilateral legs: Secondary | ICD-10-CM | POA: Diagnosis not present

## 2022-09-21 DIAGNOSIS — M79676 Pain in unspecified toe(s): Secondary | ICD-10-CM | POA: Diagnosis not present

## 2022-09-21 NOTE — H&P (Signed)
Patient's anticipated LOS is less than 2 midnights, meeting these requirements: - Younger than 33 - Lives within 1 hour of care - Has a competent adult at home to recover with post-op recover - NO history of  - Chronic pain requiring opiods  - Diabetes  - Coronary Artery Disease  - Heart failure  - Heart attack  - Stroke  - DVT/VTE  - Cardiac arrhythmia  - Respiratory Failure/COPD  - Renal failure  - Anemia  - Advanced Liver disease     Brandon [name] is an 77 y.o. male.    Chief Complaint: left shoulder pain  HPI: Pt is a 77 y.o. male complaining of left shoulder pain for multiple years. Pain had continually increased since the beginning. X-rays in the clinic show end-stage arthritic changes of the left shoulder. Pt has tried various conservative treatments which have failed to alleviate their symptoms, including injections and therapy. Various options are discussed with the patient. Risks, benefits and expectations were discussed with the patient. Patient understand the risks, benefits and expectations and wishes to proceed with surgery.   PCP:  Sharilyn Sites, MD  D/C Plans: Home  PMH: Past Medical History:  Diagnosis Date   Arthritis    Coronary atherosclerosis of native coronary artery    Multivessel status post CABG March 2015   DDD (degenerative disc disease), lumbar    Essential hypertension    GERD (gastroesophageal reflux disease)    Hard of hearing both ears    Left ear is worst   Hemorrhoids    History of gout    History of kidney stones    history of myasthenia Gravis 2021   History of [facility] spotted fever    Left ureteral  & renal stone    Lumbago    Mixed hyperlipidemia    Personal history of COVID-19 08/10/2021   Paxlovid   Plantar fascial fibromatosis    Postoperative atrial fibrillation (HCC)    Skin Cancer (HCC)    Use of cane as ambulatory aid    Uses roller walker    UTI (urinary tract infection) 09/16/2021   Wears glasses      PSH: Past Surgical History:  Procedure Laterality Date   AMPUTATION Right 03/27/2014   Procedure: DEBRIDEMENT AND CLOSURE RIGHT INDEX FINGER;  Surgeon: [name], MD;  Location: [place];  Service: Orthopedics;  Laterality: Right;   BACK SURGERY     lower not sure when per wife on 10-01-2021   BACK SURGERY     BICEPT TENODESIS  11/26/2011   Procedure: BICEPT TENODESIS;  Surgeon: Augustin Schooling, MD;  Location: [place];  Service: Orthopedics;  Laterality: Left;   BIOPSY  08/03/2018   Procedure: BIOPSY;  Surgeon: Rogene Houston, MD;  [facility];  Service: Endoscopy;;  antral   Cataract surgery Bilateral    yrs ago per wife on 10-01-2021   CHOLECYSTECTOMY N/A 09/22/2018   Procedure: LAPAROSCOPIC CHOLECYSTECTOMY;  Surgeon: Virl Cagey, MD;  [facility];  Service: General;  Laterality: N/A;   COLONOSCOPY N/A 12/26/2014   Procedure: COLONOSCOPY;  Surgeon: Rogene Houston, MD;  [facility];  Service: Endoscopy;  Laterality: N/A;  730   CORONARY ARTERY BYPASS GRAFT N/A 10/19/2013   Procedure: CORONARY ARTERY BYPASS GRAFTING (CABG);  Surgeon: Rexene Alberts, MD;  Location: [place];  Service: Open Heart Surgery;  Laterality: N/A;  CABG times four using left internal mammary artery and left leg saphenous vein,  incision made on right leg but no vein removed   CYSTOSCOPY W/ URETERAL STENT PLACEMENT Right 02/22/2022   Procedure: CYSTOSCOPY WITH RETROGRADE PYELOGRAM/URETERAL STENT PLACEMENT;  Surgeon: Ceasar Mons, MD;  [facility];  Service: Urology;  Laterality: Right;   CYSTOSCOPY WITH RETROGRADE PYELOGRAM, URETEROSCOPY AND STENT PLACEMENT Left 08/24/2016   Procedure: CYSTOSCOPY WITH LEFT RETROGRADE PYELOGRAM, LEFT URETEROSCOPY, BASKET EXTRACTION LEFT URETERAL STONE;  Surgeon: Irine Seal, MD;  [facility];  Service: Urology;  Laterality: Left;   CYSTOSCOPY/URETEROSCOPY/HOLMIUM LASER/STENT PLACEMENT Left 10/06/2021   Procedure: CYSTOSCOPY  LEFT RETROGRADE PYELOGRAM URETEROSCOPY/HOLMIUM LASER/STENT PLACEMENT;  Surgeon: Irine Seal, MD;  Location: [facility];  Service: Urology;  Laterality: Left;   CYSTOSCOPY/URETEROSCOPY/HOLMIUM LASER/STENT PLACEMENT Right 03/01/2022   Procedure: CYSTOSCOPY RIGHT URETEROSCOPY/HOLMIUM LASER/STENT PLACEMENT;  Surgeon: Irine Seal, MD;  [facility];  Service: Urology;  Laterality: Right;   ESOPHAGOGASTRODUODENOSCOPY N/A 08/03/2018   Procedure: ESOPHAGOGASTRODUODENOSCOPY (EGD);  Surgeon: Rogene Houston, MD;  [facility];  Service: Endoscopy;  Laterality: N/A;  2:55   EXTRACORPOREAL SHOCK WAVE LITHOTRIPSY Right 06/18/2019   Procedure: EXTRACORPOREAL SHOCK WAVE LITHOTRIPSY (ESWL);  Surgeon: Lucas Mallow, MD;  [facility];  Service: Urology;  Laterality: Right;   INTRAOPERATIVE TRANSESOPHAGEAL ECHOCARDIOGRAM N/A 10/19/2013   Procedure: INTRAOPERATIVE TRANSESOPHAGEAL ECHOCARDIOGRAM;  Surgeon: Rexene Alberts, MD;  Location: [place];  Service: Open Heart Surgery;  Laterality: N/A;   KYPHOPLASTY N/A 03/27/2021   Procedure: Lumbar One and Lumbar Two Kyphoplasty;  Surgeon: Erline Levine, MD;  Location: [place];  Service: Neurosurgery;  Laterality: N/A;   left hand carpal tunnel release  04/04/2015   LEFT HEART CATHETERIZATION WITH CORONARY ANGIOGRAM N/A 10/09/2013   Procedure: LEFT HEART CATHETERIZATION WITH CORONARY ANGIOGRAM;  Surgeon: Burnell Blanks, MD;  [facility];  Service: Cardiovascular;  Laterality: N/A;   Left shoulder rotator cuff repair  12/04/2011   MOHS procedure  01/06/2021   dr Harl Bowie on left hand   ORIF PERIPROSTHETIC FRACTURE Left 03/19/2020   Procedure: OPEN REDUCTION INTERNAL FIXATION (ORIF) PERIPROSTHETIC FRACTURE LEFT FEMUR WITH FEMORAL REVISION;  Surgeon: Gaynelle Arabian, MD;  [facility];  Service: Orthopedics;  Laterality: Left;   TOTAL HIP ARTHROPLASTY Left 01/28/2020   Procedure: TOTAL HIP ARTHROPLASTY ANTERIOR  APPROACH;  Surgeon: Gaynelle Arabian, MD;  [facility];  Service: Orthopedics;  Laterality: Left;  178mn   TOTAL KNEE ARTHROPLASTY Left 04/18/2001   traumaticvamputation of finger  2015   VASECTOMY     yrs ago per wife on 10-01-2021    Social History:  reports that he quit smoking about 39 years ago. His smoking use included cigarettes. He started smoking about 64 years ago. He has a 60.00 pack-year smoking history. He quit smokeless tobacco use about 39 years ago.  His smokeless tobacco use included chew. He reports that he does not currently use alcohol. He reports that he does not use drugs. BMI: Estimated body mass index is 24.28 kg/m as calculated from the following:   Height as of 09/16/22: '6\' 1"'$  (1.854 m).   Weight as of 09/16/22: 83.5 kg.  Lab Results  Component Value Date   ALBUMIN 3.6 08/10/2021   Diabetes: Patient does not have a diagnosis of diabetes.     Smoking Status:      Allergies:  Allergies  Allergen Reactions   Gabapentin Other (See Comments)    Hallucinations    Medications: No current facility-administered medications for this encounter.   Current Outpatient  Medications  Medication Sig Dispense Refill   allopurinol (ZYLOPRIM) 300 MG tablet Take 150 mg by mouth in the morning.     amLODipine (NORVASC) 5 MG tablet Take 1 tablet (5 mg total) by mouth daily. 30 tablet 1   Ascorbic Acid (VITAMIN C) 1000 MG tablet Take 1,000 mg by mouth in the morning.     aspirin EC 81 MG tablet Take 1 tablet (81 mg total) by mouth daily. Swallow whole. 90 tablet 3   Calcium Carb-Cholecalciferol (CALCIUM 600+D3 PO) Take 1 tablet by mouth in the morning and at bedtime.     HYDROcodone-acetaminophen (NORCO/VICODIN) 5-325 MG tablet Take 1 tablet by mouth every 4 (four) hours as needed (for pain). 30 tablet 0   metoprolol tartrate (LOPRESSOR) 25 MG tablet Take 12.5 mg by mouth 2 (two) times daily.     Misc Natural Products (OSTEO BI-FLEX ADV TRIPLE ST) TABS Take 1 tablet by  mouth in the morning.     Multiple Vitamin (MULTIVITAMIN) tablet Take 1 tablet by mouth daily.     nitroGLYCERIN (NITROSTAT) 0.4 MG SL tablet Place 0.4 mg under the tongue every 5 (five) minutes as needed for chest pain.     Omega-3 Fatty Acids (FISH OIL) 500 MG CAPS Take 500 mg by mouth in the morning.     pravastatin (PRAVACHOL) 40 MG tablet Take 40 mg by mouth every evening.      pyridostigmine (MESTINON) 60 MG tablet Take 0.5 tablets (30 mg total) by mouth 3 (three) times daily. 135 tablet 4   Vibegron (GEMTESA PO) Take by mouth at bedtime.      No results found for this or any previous visit (from the past 48 hour(s)). No results found.  ROS: Pain with rom of the left upper extremity  Physical Exam: Alert and oriented 77 y.o. male in no acute distress Cranial nerves 2-12 intact Cervical spine: full rom with no tenderness, nv intact distally Chest: active breath sounds bilaterally, no wheeze rhonchi or rales Heart: regular rate and rhythm, no murmur Abd: non tender non distended with active bowel sounds Hip is stable with rom  Left shoulder painful and weak rom Nv intact distally No rashes or edema distally  Assessment/Plan Assessment: left shoulder cuff arthropathy  Plan:  Patient will undergo a left reverse total shoulder by Dr. Veverly Fells at [place] Risks benefits and expectations were discussed with the patient. Patient understand risks, benefits and expectations and wishes to proceed. Preoperative templating of the joint replacement has been completed, documented, and submitted to the Operating Room personnel in order to optimize intra-operative equipment management.   Merla Riches PA-C, MPAS [facility] is now [employer] [address] [place], [place], [place] [address] Phone: [phone] [email or web address] Facebook  [employer]

## 2022-09-22 NOTE — Patient Instructions (Addendum)
SURGICAL WAITING ROOM VISITATION  Patients having surgery or a procedure may have no more than 2 support people in the waiting area - these visitors may rotate.    Children under the age of 20 must have an adult with them who is not the patient.  Due to an increase in RSV and influenza rates and associated hospitalizations, children ages 70 and under may not visit patients in [place].  If the patient needs to stay at the hospital during part of their recovery, the visitor guidelines for inpatient rooms apply. Pre-op nurse will coordinate an appropriate time for 1 support person to accompany patient in pre-op.  This support person may not rotate.    Please refer to the [facility] website for the visitor guidelines for Inpatients (after your surgery is over and you are in a regular room).       Your procedure is scheduled on:  10/08/22    Report to [facility] Main Entrance    Report to admitting at AM   Call this number if you have problems the morning of surgery [phone]   Do not eat food :After Midnight.   After Midnight you may have the following liquids until __ 1200 ____  PM DAY OF SURGERY  Water Non-Citrus Juices (without pulp, NO RED-Apple, White grape, White cranberry) Black Coffee (NO MILK/CREAM OR CREAMERS, sugar ok)  Clear Tea (NO MILK/CREAM OR CREAMERS, sugar ok) regular and decaf                             Plain Jell-O (NO RED)                                           Fruit ices (not with fruit pulp, NO RED)                                     Popsicles (NO RED)                                                               Sports drinks like Gatorade (NO RED)                 The day of surgery:  Drink ONE (1) Pre-Surgery Clear Ensure or G2 at   1200 noon  ( have completed by )  the morning of surgery. Drink in one sitting. Do not sip.  This drink was given to you during your hospital  pre-op appointment visit. Nothing else to drink  after completing the  Pre-Surgery Clear Ensure or G2.          If you have questions, please contact your surgeon's office.       Oral Hygiene is also important to reduce your risk of infection.                                    Remember - BRUSH YOUR TEETH THE MORNING OF SURGERY WITH YOUR  REGULAR TOOTHPASTE  DENTURES WILL BE REMOVED PRIOR TO SURGERY PLEASE DO NOT APPLY "Poly grip" OR ADHESIVES!!!   Do NOT smoke after Midnight   Take these medicines the morning of surgery with A SIP OF WATER:  allopurinol, amlodiopine, metoprolol, mestinon , protonix, flomax, gemtesa   DO NOT TAKE ANY ORAL DIABETIC MEDICATIONS DAY OF YOUR SURGERY  Bring CPAP mask and tubing day of surgery.                              You may not have any metal on your body including hair pins, jewelry, and body piercing             Do not wear make-up, lotions, powders, perfumes/cologne, or deodorant  Do not wear nail polish including gel and S&S, artificial/acrylic nails, or any other type of covering on natural nails including finger and toenails. If you have artificial nails, gel coating, etc. that needs to be removed by a nail salon please have this removed prior to surgery or surgery may need to be canceled/ delayed if the surgeon/ anesthesia feels like they are unable to be safely monitored.   Do not shave  48 hours prior to surgery.               Men may shave face and neck.   Do not bring valuables to the hospital. [place].   Contacts, glasses, dentures or bridgework may not be worn into surgery.   Bring small overnight bag day of surgery.   DO NOT [place]. PHARMACY WILL DISPENSE MEDICATIONS LISTED ON YOUR MEDICATION LIST TO YOU DURING YOUR ADMISSION [place]!    Patients discharged on the day of surgery will not be allowed to drive home.  Someone NEEDS to stay with you for the first 24 hours after  anesthesia.   Special Instructions: Bring a copy of your healthcare power of attorney and living will documents the day of surgery if you haven't scanned them before.              Please read over the following fact sheets you were given: IF [place] [phone]   If you received a COVID test during your pre-op visit  it is requested that you wear a mask when out in public, stay away from anyone that may not be feeling well and notify your surgeon if you develop symptoms. If you test positive for Covid or have been in contact with anyone that has tested positive in the last 10 days please notify you surgeon.    [place] - Preparing for Surgery Before surgery, you can play an important role.  Because skin is not sterile, your skin needs to be as free of germs as possible.  You can reduce the number of germs on your skin by washing with CHG (chlorahexidine gluconate) soap before surgery.  CHG is an antiseptic cleaner which kills germs and bonds with the skin to continue killing germs even after washing. Please DO NOT use if you have an allergy to CHG or antibacterial soaps.  If your skin becomes reddened/irritated stop using the CHG and inform your nurse when you arrive at Short Stay. Do not shave (including legs and underarms) for at least 48 hours  prior to the first CHG shower.  You may shave your face/neck. Please follow these instructions carefully:  1.  Shower with CHG Soap the night before surgery and the  morning of Surgery.  2.  If you choose to wash your hair, wash your hair first as usual with your  normal  shampoo.  3.  After you shampoo, rinse your hair and body thoroughly to remove the  shampoo.                           4.  Use CHG as you would any other liquid soap.  You can apply chg directly  to the skin and wash                       Gently with a scrungie or clean washcloth.  5.  Apply the CHG Soap to your body ONLY FROM THE  NECK DOWN.   Do not use on face/ open                           Wound or open sores. Avoid contact with eyes, ears mouth and genitals (private parts).                       Wash face,  Genitals (private parts) with your normal soap.             6.  Wash thoroughly, paying special attention to the area where your surgery  will be performed.  7.  Thoroughly rinse your body with warm water from the neck down.  8.  DO NOT shower/wash with your normal soap after using and rinsing off  the CHG Soap.                9.  Pat yourself dry with a clean towel.            10.  Wear clean pajamas.            11.  Place clean sheets on your bed the night of your first shower and do not  sleep with pets. Day of Surgery : Do not apply any lotions/deodorants the morning of surgery.  Please wear clean clothes to the hospital/surgery center.  FAILURE TO FOLLOW THESE INSTRUCTIONS MAY RESULT IN THE CANCELLATION OF YOUR SURGERY PATIENT SIGNATURE_________________________________  NURSE SIGNATURE__________________________________  ________________________________________________________________________  [facility]- Preparing for Total Shoulder Arthroplasty    Before surgery, you can play an important role. Because skin is not sterile, your skin needs to be as free of germs as possible. You can reduce the number of germs on your skin by using the following products. Benzoyl Peroxide Gel Reduces the number of germs present on the skin Applied twice a day to shoulder area starting two days before surgery    ==================================================================  Please follow these instructions carefully:  BENZOYL PEROXIDE 5% GEL  Please do not use if you have an allergy to benzoyl peroxide.   If your skin becomes reddened/irritated stop using the benzoyl peroxide.  Starting two days before surgery, apply as follows: Apply benzoyl peroxide in the morning and at night. Apply after taking a shower.  If you are not taking a shower clean entire shoulder front, back, and side along with the armpit with a clean wet washcloth.  Place a quarter-sized dollop on your shoulder and rub in thoroughly, making sure to  cover the front, back, and side of your shoulder, along with the armpit.   2 days before ____ AM   ____ PM              1 day before ____ AM   ____ PM                         Do this twice a day for two days.  (Last application is the night before surgery, AFTER using the CHG soap as described below).  Do NOT apply benzoyl peroxide gel on the day of surgery.

## 2022-09-22 NOTE — Progress Notes (Addendum)
Anesthesia Review:  PCP: Sharilyn Sites clearance 09/19/22 on chart with lov note on chart dated 09/20/22.  Cardiologist : Rozann Lesches LOV Ambrose Pancoast, NP - 08/30/22 clearance on chart with notel.  Neurology- DR [name] LOV 2/1 /24 on chart  Chest x-ray : EKG : 01/04/22  Echo : 2021  Stress test: 10/05/21  Cardiac Cath :  Activity level:  Sleep Study/ CPAP : Fasting Blood Sugar :      / Checks Blood Sugar -- times a day:   Blood Thinner/ Instructions /Last Dose: ASA / Instructions/ Last Dose :    81 mg aspirin    AT preop appt pt states he is having rihgt shoulder surgery.  OR schedule states left and consent states left.  Called and LVMM for Megan at [facility] in regards to this.  Consent was not signed at preop.  PT aware that phone call was made at time of preop appt.

## 2022-09-24 ENCOUNTER — Encounter (HOSPITAL_COMMUNITY)
Admission: RE | Admit: 2022-09-24 | Discharge: 2022-09-24 | Disposition: A | Discharge status: Home / Self Care | Payer: Medicare Other | Source: Ambulatory Visit | Attending: Orthopedic Surgery | Admitting: Orthopedic Surgery

## 2022-09-24 ENCOUNTER — Other Ambulatory Visit: Payer: Self-pay

## 2022-09-24 ENCOUNTER — Encounter (HOSPITAL_COMMUNITY): Payer: Self-pay

## 2022-09-24 VITALS — BP 126/80 | HR 57 | Temp 98.2°F | Resp 16 | Ht 58.5 in | Wt 180.0 lb

## 2022-09-24 DIAGNOSIS — I4891 Unspecified atrial fibrillation: Secondary | ICD-10-CM | POA: Insufficient documentation

## 2022-09-24 DIAGNOSIS — Z01812 Encounter for preprocedural laboratory examination: Secondary | ICD-10-CM | POA: Diagnosis not present

## 2022-09-24 DIAGNOSIS — I10 Essential (primary) hypertension: Secondary | ICD-10-CM | POA: Diagnosis not present

## 2022-09-24 DIAGNOSIS — M19011 Primary osteoarthritis, right shoulder: Secondary | ICD-10-CM | POA: Insufficient documentation

## 2022-09-24 DIAGNOSIS — Z01818 Encounter for other preprocedural examination: Secondary | ICD-10-CM

## 2022-09-24 DIAGNOSIS — G7000 Myasthenia gravis without (acute) exacerbation: Secondary | ICD-10-CM | POA: Diagnosis not present

## 2022-09-24 DIAGNOSIS — I2510 Atherosclerotic heart disease of native coronary artery without angina pectoris: Secondary | ICD-10-CM | POA: Insufficient documentation

## 2022-09-24 DIAGNOSIS — Z951 Presence of aortocoronary bypass graft: Secondary | ICD-10-CM | POA: Diagnosis not present

## 2022-09-24 DIAGNOSIS — Z8616 Personal history of COVID-19: Secondary | ICD-10-CM | POA: Insufficient documentation

## 2022-09-24 LAB — BASIC METABOLIC PANEL
Anion gap: 6 (ref 5–15)
BUN: 14 mg/dL (ref 8–23)
CO2: 25 mmol/L (ref 22–32)
Calcium: 9 mg/dL (ref 8.9–10.3)
Chloride: 107 mmol/L (ref 98–111)
Creatinine, Ser: 0.91 mg/dL (ref 0.61–1.24)
GFR, Estimated: >60 mL/min (ref >60)
Glucose, Bld: 102 mg/dL — ABNORMAL HIGH (ref 70–99)
Potassium: 4 mmol/L (ref 3.5–5.1)
Sodium: 138 mmol/L (ref 135–145)

## 2022-09-24 LAB — CBC
HCT: 46.6 % (ref 39.0–52.0)
Hemoglobin: 14.6 g/dL (ref 13.0–17.0)
MCH: 29.7 pg (ref 26.0–34.0)
MCHC: 31.3 g/dL (ref 30.0–36.0)
MCV: 94.7 fL (ref 80.0–100.0)
Platelets: 246 10*3/uL (ref 150–400)
RBC: 4.92 MIL/uL (ref 4.22–5.81)
RDW: 14.1 % (ref 11.5–15.5)
WBC: 8 10*3/uL (ref 4.0–10.5)
nRBC: 0 % (ref 0.0–0.2)

## 2022-09-24 LAB — SURGICAL PCR SCREEN
MRSA, PCR: POSITIVE — AB
Staphylococcus aureus: POSITIVE — AB

## 2022-09-26 ENCOUNTER — Ambulatory Visit
Admission: RE | Admit: 2022-09-26 | Discharge: 2022-09-26 | Disposition: A | Discharge status: Home / Self Care | Payer: Medicare Other | Source: Ambulatory Visit | Attending: Nurse Practitioner | Admitting: Nurse Practitioner

## 2022-09-26 DIAGNOSIS — N281 Cyst of kidney, acquired: Secondary | ICD-10-CM | POA: Diagnosis not present

## 2022-09-26 MED ORDER — GADOPICLENOL 0.5 MMOL/ML IV SOLN
7.5000 mL | Freq: Once | INTRAVENOUS | Status: AC | PRN
  Administered 2022-09-26: 7.5 mL via INTRAVENOUS

## 2022-09-27 NOTE — Anesthesia Preprocedure Evaluation (Addendum)
Anesthesia Evaluation  Patient identified by MRN, date of birth, ID band Patient awake    Reviewed: Allergy & Precautions, NPO status , Patient's Chart, lab work & pertinent test results, reviewed documented beta blocker date and time   Airway Mallampati: II  TM Distance: >3 FB Neck ROM: Full    Dental no notable dental hx. (+) Teeth Intact, Dental Advisory Given   Pulmonary former smoker   Pulmonary exam normal breath sounds clear to auscultation       Cardiovascular hypertension, Pt. on home beta blockers and Pt. on medications + CAD and + CABG  Normal cardiovascular exam+ dysrhythmias Atrial Fibrillation  Rhythm:Regular Rate:Normal  Myocardial Perfusion 10/05/2021   Findings are consistent with small prior inferior/inferoseptal myocardial infarction with very mild peri-infarct ischemia. The study is low risk.   No ST deviation was noted.   There is a small mild intensity inferior/inferoseptal defect with very mild reversibility.   Left ventricular function is normal. Nuclear stress EF: 57 %. The left ventricular ejection fraction is normal (55-65%). End diastolic cavity size is normal.   Echo 03/20/2020 1. Left ventricular ejection fraction, by estimation, is 50 to 55%. The  left ventricle has low normal function. The left ventricle has no regional  wall motion abnormalities. There is mild left ventricular hypertrophy of  the basal-septal segment. Left  ventricular diastolic function could not be evaluated.   2. Right ventricular systolic function is normal. The right ventricular  size is normal. Tricuspid regurgitation signal is inadequate for assessing  PA pressure.   3. Left atrial size was severely dilated.   4. The mitral valve is normal in structure. Trivial mitral valve  regurgitation. No evidence of mitral stenosis.   5. The aortic valve is tricuspid. Aortic valve regurgitation is not  visualized. Mild aortic valve  sclerosis is present, with no evidence of  aortic valve stenosis.   6. The inferior vena cava is normal in size with greater than 50%  respiratory variability, suggesting right atrial pressure of 3 mmHg    Neuro/Psych Myasthenia gravis, no current symptoms, taking mestinon as prescribed negative neurological ROS  negative psych ROS   GI/Hepatic Neg liver ROS,GERD  ,,  Endo/Other  negative endocrine ROS    Renal/GU negative Renal ROS  negative genitourinary   Musculoskeletal  (+) Arthritis ,    Abdominal   Peds  Hematology negative hematology ROS (+)   Anesthesia Other Findings   Reproductive/Obstetrics                             Anesthesia Physical Anesthesia Plan  ASA: 3  Anesthesia Plan: General and Regional   Post-op Pain Management: Regional block*   Induction: Intravenous  PONV Risk Score and Plan: 2 and Dexamethasone, Ondansetron and Treatment may vary due to age or medical condition  Airway Management Planned: Oral ETT  Additional Equipment:   Intra-op Plan:   Post-operative Plan: Extubation in OR  Informed Consent: I have reviewed the patients History and Physical, chart, labs and discussed the procedure including the risks, benefits and alternatives for the proposed anesthesia with the patient or authorized representative who has indicated his/her understanding and acceptance.     Dental advisory given  Plan Discussed with: CRNA  Anesthesia Plan Comments: (See PAT note 09/24/2022  Discussed risks of interscalene nerve block with myasthenia gravis and pt would like to proceed with regional analgesia. )       Anesthesia Quick  Evaluation

## 2022-09-27 NOTE — Progress Notes (Signed)
Anesthesia Chart Review   Case: [id number] Date/Time: 10/08/22 1450   Procedure: REVERSE SHOULDER ARTHROPLASTY (Right: Shoulder) - general, choice with interscalene block   Anesthesia type: General   Pre-op diagnosis: right shoulder osteoarthritis   Location: [facility]OM 06 / [facility]   Surgeons: Netta Cedars, MD       DISCUSSION:77 y.o. former smoker with h/o CAD (CABG 2015), atrial fibrillation, Myasthenia Gravis, right shoulder OA scheduled for above procedure 10/08/22 with Dr. Netta Cedars.   Pt last seen by cardiology 08/30/2022. Per OV note, "The patient affirms he has been doing well without any new cardiac symptoms. They are able to achieve 4 METS without cardiac limitations. Therefore, based on ACC/AHA guidelines, the patient would be at acceptable risk for the planned procedure without further cardiovascular testing. The patient was advised that if he develops new symptoms prior to surgery to contact our office to arrange for a follow-up visit, and he verbalized understanding.    Mr. Cerasoli perioperative risk of a major cardiac event is 0.9% according to the Revised Cardiac Risk Index (RCRI).  Therefore, he is at low risk for perioperative complications.   His functional capacity is good at 4.31 METs according to the Duke Activity Status Index (DASI). Recommendations: According to ACC/AHA guidelines, no further cardiovascular testing needed.  The patient may proceed to surgery at acceptable risk.   Antiplatelet and/or Anticoagulation Recommendations: Aspirin can be held for 7 days prior to his surgery.  Please resume Aspirin post operatively when it is felt to be safe from a bleeding standpoint."  Anticipate pt can proceed with planned procedure barring acute status change.   VS: BP 126/80   Pulse (!) 57   Temp 36.8 C (Oral)   Resp 16   Ht 4' 10.5" (1.486 m)   Wt 81.6 kg   SpO2 99%   BMI 36.98 kg/m   PROVIDERS: Sharilyn Sites, MD is PCP   Cardiologist : Rozann Lesches,  MD LABS: Labs reviewed: Acceptable for surgery. (all labs ordered are listed, but only abnormal results are displayed)  Labs Reviewed  SURGICAL PCR SCREEN - Abnormal; Notable for the following components:      Result Value   MRSA, PCR POSITIVE (*)    Staphylococcus aureus POSITIVE (*)    All other components within normal limits  BASIC METABOLIC PANEL - Abnormal; Notable for the following components:   Glucose, Bld 102 (*)    All other components within normal limits  CBC     IMAGES:   EKG:   CV: Myocardial Perfusion 10/05/2021   Findings are consistent with small prior inferior/inferoseptal myocardial infarction with very mild peri-infarct ischemia. The study is low risk.   No ST deviation was noted.   There is a small mild intensity inferior/inferoseptal defect with very mild reversibility.   Left ventricular function is normal. Nuclear stress EF: 57 %. The left ventricular ejection fraction is normal (55-65%). End diastolic cavity size is normal.  Echo 03/20/2020 1. Left ventricular ejection fraction, by estimation, is 50 to 55%. The  left ventricle has low normal function. The left ventricle has no regional  wall motion abnormalities. There is mild left ventricular hypertrophy of  the basal-septal segment. Left  ventricular diastolic function could not be evaluated.   2. Right ventricular systolic function is normal. The right ventricular  size is normal. Tricuspid regurgitation signal is inadequate for assessing  PA pressure.   3. Left atrial size was severely dilated.   4. The mitral valve is  normal in structure. Trivial mitral valve  regurgitation. No evidence of mitral stenosis.   5. The aortic valve is tricuspid. Aortic valve regurgitation is not  visualized. Mild aortic valve sclerosis is present, with no evidence of  aortic valve stenosis.   6. The inferior vena cava is normal in size with greater than 50%  respiratory variability, suggesting right atrial  pressure of 3 mmHg.  Past Medical History:  Diagnosis Date   Arthritis    Coronary atherosclerosis of native coronary artery    Multivessel status post CABG March 2015   DDD (degenerative disc disease), lumbar    Essential hypertension    GERD (gastroesophageal reflux disease)    Hard of hearing both ears    Left ear is worst   Hemorrhoids    History of gout    History of kidney stones    history of myasthenia Gravis 2021   History of [employer] spotted fever    Left ureteral  & renal stone    Lumbago    Mixed hyperlipidemia    Personal history of COVID-19 08/10/2021   Paxlovid   Plantar fascial fibromatosis    Postoperative atrial fibrillation (HCC)    Skin Cancer (HCC)    Use of cane as ambulatory aid    Uses roller walker    UTI (urinary tract infection) 09/16/2021   Wears glasses     Past Surgical History:  Procedure Laterality Date   AMPUTATION Right 03/27/2014   Procedure: DEBRIDEMENT AND CLOSURE RIGHT INDEX FINGER;  Surgeon: [name], MD;  Location: [place];  Service: Orthopedics;  Laterality: Right;   BACK SURGERY     lower not sure when per wife on 10-01-2021   BACK SURGERY     BICEPT TENODESIS  11/26/2011   Procedure: BICEPT TENODESIS;  Surgeon: Augustin Schooling, MD;  Location: [place];  Service: Orthopedics;  Laterality: Left;   BIOPSY  08/03/2018   Procedure: BIOPSY;  Surgeon: Rogene Houston, MD;  [facility];  Service: Endoscopy;;  antral   Cataract surgery Bilateral    yrs ago per wife on 10-01-2021   CHOLECYSTECTOMY N/A 09/22/2018   Procedure: LAPAROSCOPIC CHOLECYSTECTOMY;  Surgeon: Virl Cagey, MD;  [facility];  Service: General;  Laterality: N/A;   COLONOSCOPY N/A 12/26/2014   Procedure: COLONOSCOPY;  Surgeon: Rogene Houston, MD;  [facility];  Service: Endoscopy;  Laterality: N/A;  730   CORONARY ARTERY BYPASS GRAFT N/A 10/19/2013   Procedure: CORONARY ARTERY BYPASS GRAFTING (CABG);  Surgeon: Rexene Alberts,  MD;  Location: [place];  Service: Open Heart Surgery;  Laterality: N/A;  CABG times four using left internal mammary artery and left leg saphenous vein, incision made on right leg but no vein removed   CYSTOSCOPY W/ URETERAL STENT PLACEMENT Right 02/22/2022   Procedure: CYSTOSCOPY WITH RETROGRADE PYELOGRAM/URETERAL STENT PLACEMENT;  Surgeon: Ceasar Mons, MD;  [facility];  Service: Urology;  Laterality: Right;   CYSTOSCOPY WITH RETROGRADE PYELOGRAM, URETEROSCOPY AND STENT PLACEMENT Left 08/24/2016   Procedure: CYSTOSCOPY WITH LEFT RETROGRADE PYELOGRAM, LEFT URETEROSCOPY, BASKET EXTRACTION LEFT URETERAL STONE;  Surgeon: Irine Seal, MD;  [facility];  Service: Urology;  Laterality: Left;   CYSTOSCOPY/URETEROSCOPY/HOLMIUM LASER/STENT PLACEMENT Left 10/06/2021   Procedure: CYSTOSCOPY LEFT RETROGRADE PYELOGRAM URETEROSCOPY/HOLMIUM LASER/STENT PLACEMENT;  Surgeon: Irine Seal, MD;  [facility];  Service: Urology;  Laterality: Left;   CYSTOSCOPY/URETEROSCOPY/HOLMIUM LASER/STENT PLACEMENT Right 03/01/2022   Procedure: CYSTOSCOPY RIGHT URETEROSCOPY/HOLMIUM  LASER/STENT PLACEMENT;  Surgeon: Irine Seal, MD;  [facility];  Service: Urology;  Laterality: Right;   ESOPHAGOGASTRODUODENOSCOPY N/A 08/03/2018   Procedure: ESOPHAGOGASTRODUODENOSCOPY (EGD);  Surgeon: Rogene Houston, MD;  [facility];  Service: Endoscopy;  Laterality: N/A;  2:55   EXTRACORPOREAL SHOCK WAVE LITHOTRIPSY Right 06/18/2019   Procedure: EXTRACORPOREAL SHOCK WAVE LITHOTRIPSY (ESWL);  Surgeon: Lucas Mallow, MD;  [facility];  Service: Urology;  Laterality: Right;   INTRAOPERATIVE TRANSESOPHAGEAL ECHOCARDIOGRAM N/A 10/19/2013   Procedure: INTRAOPERATIVE TRANSESOPHAGEAL ECHOCARDIOGRAM;  Surgeon: Rexene Alberts, MD;  Location: [place];  Service: Open Heart Surgery;  Laterality: N/A;   KYPHOPLASTY N/A 03/27/2021   Procedure: Lumbar One and Lumbar Two Kyphoplasty;  Surgeon:  Erline Levine, MD;  Location: [place];  Service: Neurosurgery;  Laterality: N/A;   left hand carpal tunnel release  04/04/2015   LEFT HEART CATHETERIZATION WITH CORONARY ANGIOGRAM N/A 10/09/2013   Procedure: LEFT HEART CATHETERIZATION WITH CORONARY ANGIOGRAM;  Surgeon: Burnell Blanks, MD;  Location: [facility];  Service: Cardiovascular;  Laterality: N/A;   Left shoulder rotator cuff repair  12/04/2011   MOHS procedure  01/06/2021   dr Harl Bowie on left hand   ORIF PERIPROSTHETIC FRACTURE Left 03/19/2020   Procedure: OPEN REDUCTION INTERNAL FIXATION (ORIF) PERIPROSTHETIC FRACTURE LEFT FEMUR WITH FEMORAL REVISION;  Surgeon: Gaynelle Arabian, MD;  [facility];  Service: Orthopedics;  Laterality: Left;   TOTAL HIP ARTHROPLASTY Left 01/28/2020   Procedure: TOTAL HIP ARTHROPLASTY ANTERIOR APPROACH;  Surgeon: Gaynelle Arabian, MD;  [facility];  Service: Orthopedics;  Laterality: Left;  141mn   TOTAL KNEE ARTHROPLASTY Left 04/18/2001   traumaticvamputation of finger  2015   VASECTOMY     yrs ago per wife on 10-01-2021    MEDICATIONS:  allopurinol (ZYLOPRIM) 300 MG tablet   amLODipine (NORVASC) 5 MG tablet   Ascorbic Acid (VITAMIN C) 1000 MG tablet   aspirin EC 81 MG tablet   Calcium Carb-Cholecalciferol (CALCIUM 600+D3 PO)   Guaifenesin (MUCINEX MAXIMUM STRENGTH) 1200 MG TB12   HYDROcodone-acetaminophen (NORCO/VICODIN) 5-325 MG tablet   metoprolol tartrate (LOPRESSOR) 25 MG tablet   Misc Natural Products (OSTEO BI-FLEX ADV TRIPLE ST) TABS   Multiple Vitamin (MULTIVITAMIN) tablet   nitroGLYCERIN (NITROSTAT) 0.4 MG SL tablet   Omega-3 Fatty Acids (FISH OIL) 500 MG CAPS   pantoprazole (PROTONIX) 40 MG tablet   pravastatin (PRAVACHOL) 40 MG tablet   pyridostigmine (MESTINON) 60 MG tablet   tamsulosin (FLOMAX) 0.4 MG CAPS capsule   Vibegron (GEMTESA) 75 MG TABS   No current facility-administered medications for this encounter.    JKonrad FelixWard, PA-C [facility]  Pre-Surgical Testing ([phone]

## 2022-10-08 ENCOUNTER — Ambulatory Visit (HOSPITAL_COMMUNITY): Payer: Medicare Other

## 2022-10-08 ENCOUNTER — Encounter (HOSPITAL_COMMUNITY)
Admission: RE | Disposition: A | Discharge status: Home / Self Care | Payer: Self-pay | Source: Ambulatory Visit | Attending: Orthopedic Surgery

## 2022-10-08 ENCOUNTER — Ambulatory Visit (HOSPITAL_COMMUNITY): Payer: Medicare Other | Admitting: Physician Assistant

## 2022-10-08 ENCOUNTER — Other Ambulatory Visit: Payer: Self-pay

## 2022-10-08 ENCOUNTER — Ambulatory Visit (HOSPITAL_BASED_OUTPATIENT_CLINIC_OR_DEPARTMENT_OTHER): Payer: Medicare Other | Admitting: Anesthesiology

## 2022-10-08 ENCOUNTER — Encounter (HOSPITAL_COMMUNITY): Payer: Self-pay | Admitting: Orthopedic Surgery

## 2022-10-08 ENCOUNTER — Observation Stay (HOSPITAL_COMMUNITY)
Admission: RE | Admit: 2022-10-08 | Discharge: 2022-10-09 | Disposition: A | Discharge status: Home / Self Care | Payer: Medicare Other | Source: Ambulatory Visit | Attending: Orthopedic Surgery | Admitting: Orthopedic Surgery

## 2022-10-08 DIAGNOSIS — Z79899 Other long term (current) drug therapy: Secondary | ICD-10-CM | POA: Diagnosis not present

## 2022-10-08 DIAGNOSIS — I10 Essential (primary) hypertension: Secondary | ICD-10-CM | POA: Diagnosis not present

## 2022-10-08 DIAGNOSIS — Z96642 Presence of left artificial hip joint: Secondary | ICD-10-CM | POA: Insufficient documentation

## 2022-10-08 DIAGNOSIS — M19011 Primary osteoarthritis, right shoulder: Principal | ICD-10-CM | POA: Insufficient documentation

## 2022-10-08 DIAGNOSIS — Z85828 Personal history of other malignant neoplasm of skin: Secondary | ICD-10-CM | POA: Insufficient documentation

## 2022-10-08 DIAGNOSIS — Z96652 Presence of left artificial knee joint: Secondary | ICD-10-CM | POA: Insufficient documentation

## 2022-10-08 DIAGNOSIS — G8918 Other acute postprocedural pain: Secondary | ICD-10-CM | POA: Diagnosis not present

## 2022-10-08 DIAGNOSIS — I2510 Atherosclerotic heart disease of native coronary artery without angina pectoris: Secondary | ICD-10-CM

## 2022-10-08 DIAGNOSIS — Z951 Presence of aortocoronary bypass graft: Secondary | ICD-10-CM | POA: Insufficient documentation

## 2022-10-08 DIAGNOSIS — Z8616 Personal history of COVID-19: Secondary | ICD-10-CM | POA: Diagnosis not present

## 2022-10-08 DIAGNOSIS — Z87891 Personal history of nicotine dependence: Secondary | ICD-10-CM | POA: Diagnosis not present

## 2022-10-08 DIAGNOSIS — Z7982 Long term (current) use of aspirin: Secondary | ICD-10-CM | POA: Insufficient documentation

## 2022-10-08 DIAGNOSIS — Z96611 Presence of right artificial shoulder joint: Secondary | ICD-10-CM

## 2022-10-08 DIAGNOSIS — Z01818 Encounter for other preprocedural examination: Secondary | ICD-10-CM

## 2022-10-08 DIAGNOSIS — Z471 Aftercare following joint replacement surgery: Secondary | ICD-10-CM | POA: Diagnosis not present

## 2022-10-08 HISTORY — PX: REVERSE SHOULDER ARTHROPLASTY: SHX5054

## 2022-10-08 SURGERY — ARTHROPLASTY, SHOULDER, TOTAL, REVERSE
Anesthesia: Regional | Site: Shoulder | Laterality: Right

## 2022-10-08 MED ORDER — METOCLOPRAMIDE HCL 5 MG PO TABS: 5.0000 mg | ORAL_TABLET | Freq: Three times a day (TID) | ORAL | Status: DC | PRN

## 2022-10-08 MED ORDER — ONDANSETRON HCL 4 MG/2ML IJ SOLN
INTRAMUSCULAR | Status: AC
  Filled 2022-10-08: qty 2

## 2022-10-08 MED ORDER — TRANEXAMIC ACID-NACL 1000-0.7 MG/100ML-% IV SOLN
1000.0000 mg | Freq: Once | INTRAVENOUS | Status: AC
  Administered 2022-10-08: 1000 mg via INTRAVENOUS
  Filled 2022-10-08: qty 100

## 2022-10-08 MED ORDER — DEXAMETHASONE SODIUM PHOSPHATE 10 MG/ML IJ SOLN
INTRAMUSCULAR | Status: DC | PRN
  Administered 2022-10-08: 10 mg via INTRAVENOUS

## 2022-10-08 MED ORDER — EPINEPHRINE 1 MG/10ML IJ SOSY
PREFILLED_SYRINGE | INTRAMUSCULAR | Status: DC | PRN
  Administered 2022-10-08: .1 mg

## 2022-10-08 MED ORDER — FENTANYL CITRATE PF 50 MCG/ML IJ SOSY
50.0000 ug | PREFILLED_SYRINGE | INTRAMUSCULAR | Status: DC
  Administered 2022-10-08: 50 ug via INTRAVENOUS
  Filled 2022-10-08: qty 2

## 2022-10-08 MED ORDER — BUPIVACAINE HCL (PF) 0.5 % IJ SOLN
INTRAMUSCULAR | Status: DC | PRN
  Administered 2022-10-08: 15 mL via PERINEURAL

## 2022-10-08 MED ORDER — PHENYLEPHRINE HCL (PRESSORS) 10 MG/ML IV SOLN
INTRAVENOUS | Status: AC
  Filled 2022-10-08: qty 1

## 2022-10-08 MED ORDER — LACTATED RINGERS IV SOLN: INTRAVENOUS | Status: DC

## 2022-10-08 MED ORDER — CHLORHEXIDINE GLUCONATE 0.12 % MT SOLN
15.0000 mL | Freq: Once | OROMUCOSAL | Status: AC
  Administered 2022-10-08: 15 mL via OROMUCOSAL

## 2022-10-08 MED ORDER — PROPOFOL 10 MG/ML IV BOLUS
INTRAVENOUS | Status: AC
  Filled 2022-10-08: qty 20

## 2022-10-08 MED ORDER — DOCUSATE SODIUM 100 MG PO CAPS
100.0000 mg | ORAL_CAPSULE | Freq: Two times a day (BID) | ORAL | Status: DC
  Administered 2022-10-08 – 2022-10-09 (×2): 100 mg via ORAL
  Filled 2022-10-08 (×2): qty 1

## 2022-10-08 MED ORDER — LIDOCAINE 2% (20 MG/ML) 5 ML SYRINGE
INTRAMUSCULAR | Status: DC | PRN
  Administered 2022-10-08: 60 mg via INTRAVENOUS

## 2022-10-08 MED ORDER — PHENYLEPHRINE HCL-NACL 20-0.9 MG/250ML-% IV SOLN
INTRAVENOUS | Status: DC | PRN
  Administered 2022-10-08: 50 ug/min via INTRAVENOUS

## 2022-10-08 MED ORDER — VITAMIN C 500 MG PO TABS
1000.0000 mg | ORAL_TABLET | Freq: Every day | ORAL | Status: DC
  Administered 2022-10-08 – 2022-10-09 (×2): 1000 mg via ORAL
  Filled 2022-10-08 (×2): qty 2

## 2022-10-08 MED ORDER — BUPIVACAINE-EPINEPHRINE (PF) 0.25% -1:200000 IJ SOLN
INTRAMUSCULAR | Status: AC
  Filled 2022-10-08: qty 30

## 2022-10-08 MED ORDER — ASPIRIN 81 MG PO TBEC
81.0000 mg | DELAYED_RELEASE_TABLET | Freq: Every day | ORAL | Status: DC
  Administered 2022-10-09: 81 mg via ORAL
  Filled 2022-10-08: qty 1

## 2022-10-08 MED ORDER — ROCURONIUM BROMIDE 10 MG/ML (PF) SYRINGE
PREFILLED_SYRINGE | INTRAVENOUS | Status: DC | PRN
  Administered 2022-10-08: 30 mg via INTRAVENOUS

## 2022-10-08 MED ORDER — PHENOL 1.4 % MT LIQD: 1.0000 | OROMUCOSAL | Status: DC | PRN

## 2022-10-08 MED ORDER — METHOCARBAMOL 1000 MG/10ML IJ SOLN: 500.0000 mg | Freq: Four times a day (QID) | INTRAVENOUS | Status: DC | PRN

## 2022-10-08 MED ORDER — ROCURONIUM BROMIDE 10 MG/ML (PF) SYRINGE
PREFILLED_SYRINGE | INTRAVENOUS | Status: AC
  Filled 2022-10-08: qty 10

## 2022-10-08 MED ORDER — SODIUM CHLORIDE 0.9 % IV SOLN: INTRAVENOUS | Status: DC

## 2022-10-08 MED ORDER — DEXAMETHASONE SODIUM PHOSPHATE 10 MG/ML IJ SOLN
INTRAMUSCULAR | Status: AC
  Filled 2022-10-08: qty 1

## 2022-10-08 MED ORDER — ONDANSETRON HCL 4 MG PO TABS: 4.0000 mg | ORAL_TABLET | Freq: Four times a day (QID) | ORAL | Status: DC | PRN

## 2022-10-08 MED ORDER — EPHEDRINE 5 MG/ML INJ
INTRAVENOUS | Status: AC
  Filled 2022-10-08: qty 10

## 2022-10-08 MED ORDER — HYDROCODONE-ACETAMINOPHEN 5-325 MG PO TABS: 1.0000 | ORAL_TABLET | ORAL | 0 refills | Status: DC | PRN

## 2022-10-08 MED ORDER — BUPIVACAINE LIPOSOME 1.3 % IJ SUSP
INTRAMUSCULAR | Status: DC | PRN
  Administered 2022-10-08: 10 mL via PERINEURAL

## 2022-10-08 MED ORDER — NITROGLYCERIN 0.4 MG SL SUBL: 0.4000 mg | SUBLINGUAL_TABLET | SUBLINGUAL | Status: DC | PRN

## 2022-10-08 MED ORDER — ONDANSETRON HCL 4 MG/2ML IJ SOLN
INTRAMUSCULAR | Status: DC | PRN
  Administered 2022-10-08: 4 mg via INTRAVENOUS

## 2022-10-08 MED ORDER — BISACODYL 10 MG RE SUPP: 10.0000 mg | Freq: Every day | RECTAL | Status: DC | PRN

## 2022-10-08 MED ORDER — MORPHINE SULFATE (PF) 2 MG/ML IV SOLN: 0.5000 mg | INTRAVENOUS | Status: DC | PRN

## 2022-10-08 MED ORDER — ORAL CARE MOUTH RINSE: 15.0000 mL | Freq: Once | OROMUCOSAL | Status: AC

## 2022-10-08 MED ORDER — SODIUM CHLORIDE 0.9 % IR SOLN
Status: DC | PRN
  Administered 2022-10-08: 1000 mL

## 2022-10-08 MED ORDER — OSTEO BI-FLEX ADV TRIPLE ST PO TABS: 1.0000 | ORAL_TABLET | Freq: Every morning | ORAL | Status: DC

## 2022-10-08 MED ORDER — POLYETHYLENE GLYCOL 3350 17 G PO PACK: 17.0000 g | PACK | Freq: Every day | ORAL | Status: DC | PRN

## 2022-10-08 MED ORDER — METOCLOPRAMIDE HCL 5 MG/ML IJ SOLN: 5.0000 mg | Freq: Three times a day (TID) | INTRAMUSCULAR | Status: DC | PRN

## 2022-10-08 MED ORDER — MENTHOL 3 MG MT LOZG: 1.0000 | LOZENGE | OROMUCOSAL | Status: DC | PRN

## 2022-10-08 MED ORDER — PROPOFOL 10 MG/ML IV BOLUS
INTRAVENOUS | Status: DC | PRN
  Administered 2022-10-08: 120 mg via INTRAVENOUS

## 2022-10-08 MED ORDER — EPINEPHRINE PF 1 MG/ML IJ SOLN
INTRAMUSCULAR | Status: AC
  Filled 2022-10-08: qty 1

## 2022-10-08 MED ORDER — PHENYLEPHRINE 80 MCG/ML (10ML) SYRINGE FOR IV PUSH (FOR BLOOD PRESSURE SUPPORT)
PREFILLED_SYRINGE | INTRAVENOUS | Status: DC | PRN
  Administered 2022-10-08: 80 ug via INTRAVENOUS

## 2022-10-08 MED ORDER — AMLODIPINE BESYLATE 5 MG PO TABS
5.0000 mg | ORAL_TABLET | Freq: Every day | ORAL | Status: DC
  Filled 2022-10-08: qty 1

## 2022-10-08 MED ORDER — ONDANSETRON HCL 4 MG/2ML IJ SOLN: 4.0000 mg | Freq: Four times a day (QID) | INTRAMUSCULAR | Status: DC | PRN

## 2022-10-08 MED ORDER — GUAIFENESIN ER 600 MG PO TB12
1200.0000 mg | ORAL_TABLET | Freq: Every day | ORAL | Status: DC
  Administered 2022-10-08: 1200 mg via ORAL
  Filled 2022-10-08 (×2): qty 2

## 2022-10-08 MED ORDER — CEFAZOLIN SODIUM-DEXTROSE 2-4 GM/100ML-% IV SOLN
2.0000 g | Freq: Four times a day (QID) | INTRAVENOUS | Status: AC
  Administered 2022-10-08 – 2022-10-09 (×3): 2 g via INTRAVENOUS
  Filled 2022-10-08 (×3): qty 100

## 2022-10-08 MED ORDER — METHOCARBAMOL 500 MG PO TABS
500.0000 mg | ORAL_TABLET | Freq: Four times a day (QID) | ORAL | Status: DC | PRN
  Administered 2022-10-08: 500 mg via ORAL
  Filled 2022-10-08: qty 1

## 2022-10-08 MED ORDER — TRANEXAMIC ACID-NACL 1000-0.7 MG/100ML-% IV SOLN
1000.0000 mg | INTRAVENOUS | Status: AC
  Administered 2022-10-08: 1000 mg via INTRAVENOUS
  Filled 2022-10-08: qty 100

## 2022-10-08 MED ORDER — BUPIVACAINE-EPINEPHRINE (PF) 0.25% -1:200000 IJ SOLN
INTRAMUSCULAR | Status: DC | PRN
  Administered 2022-10-08: 30 mL via PERINEURAL

## 2022-10-08 MED ORDER — ALLOPURINOL 300 MG PO TABS
150.0000 mg | ORAL_TABLET | Freq: Every day | ORAL | Status: DC
  Administered 2022-10-09: 150 mg via ORAL
  Filled 2022-10-08: qty 1

## 2022-10-08 MED ORDER — PRAVASTATIN SODIUM 20 MG PO TABS
40.0000 mg | ORAL_TABLET | Freq: Every evening | ORAL | Status: DC
  Administered 2022-10-08: 40 mg via ORAL
  Filled 2022-10-08: qty 2

## 2022-10-08 MED ORDER — TAMSULOSIN HCL 0.4 MG PO CAPS
0.4000 mg | ORAL_CAPSULE | Freq: Every day | ORAL | Status: DC
  Administered 2022-10-09: 0.4 mg via ORAL
  Filled 2022-10-08: qty 1

## 2022-10-08 MED ORDER — HYDROCODONE-ACETAMINOPHEN 5-325 MG PO TABS
1.0000 | ORAL_TABLET | ORAL | Status: DC | PRN
  Administered 2022-10-09 (×2): 1 via ORAL
  Filled 2022-10-08 (×2): qty 1

## 2022-10-08 MED ORDER — PYRIDOSTIGMINE BROMIDE 60 MG PO TABS
30.0000 mg | ORAL_TABLET | Freq: Once | ORAL | Status: AC
  Administered 2022-10-08: 30 mg via ORAL
  Filled 2022-10-08: qty 0.5

## 2022-10-08 MED ORDER — METOPROLOL TARTRATE 12.5 MG HALF TABLET
12.5000 mg | ORAL_TABLET | Freq: Two times a day (BID) | ORAL | Status: DC
  Administered 2022-10-08: 12.5 mg via ORAL
  Filled 2022-10-08 (×2): qty 1

## 2022-10-08 MED ORDER — PYRIDOSTIGMINE BROMIDE 60 MG PO TABS
30.0000 mg | ORAL_TABLET | Freq: Three times a day (TID) | ORAL | Status: DC
  Administered 2022-10-08: 30 mg via ORAL
  Filled 2022-10-08: qty 0.5

## 2022-10-08 MED ORDER — OMEGA-3-ACID ETHYL ESTERS 1 G PO CAPS
1.0000 g | ORAL_CAPSULE | Freq: Every day | ORAL | Status: DC
  Administered 2022-10-09: 1 g via ORAL
  Filled 2022-10-08: qty 1

## 2022-10-08 MED ORDER — SUGAMMADEX SODIUM 200 MG/2ML IV SOLN
INTRAVENOUS | Status: DC | PRN
  Administered 2022-10-08: 200 mg via INTRAVENOUS

## 2022-10-08 MED ORDER — ACETAMINOPHEN 325 MG PO TABS: 325.0000 mg | ORAL_TABLET | Freq: Four times a day (QID) | ORAL | Status: DC | PRN

## 2022-10-08 MED ORDER — GUAIFENESIN ER 600 MG PO TB12: 1200.0000 mg | ORAL_TABLET | Freq: Every evening | ORAL | Status: DC | PRN

## 2022-10-08 MED ORDER — EPHEDRINE SULFATE-NACL 50-0.9 MG/10ML-% IV SOSY
PREFILLED_SYRINGE | INTRAVENOUS | Status: DC | PRN
  Administered 2022-10-08: 5 mg via INTRAVENOUS
  Administered 2022-10-08 (×2): 10 mg via INTRAVENOUS

## 2022-10-08 MED ORDER — GLYCOPYRROLATE 0.2 MG/ML IJ SOLN
INTRAMUSCULAR | Status: AC
  Filled 2022-10-08: qty 1

## 2022-10-08 MED ORDER — VANCOMYCIN HCL IN DEXTROSE 1-5 GM/200ML-% IV SOLN
INTRAVENOUS | Status: AC
  Administered 2022-10-08: 1000 mg via INTRAVENOUS
  Filled 2022-10-08: qty 200

## 2022-10-08 MED ORDER — ORAL CARE MOUTH RINSE: 15.0000 mL | OROMUCOSAL | Status: DC | PRN

## 2022-10-08 MED ORDER — MIRABEGRON ER 25 MG PO TB24
25.0000 mg | ORAL_TABLET | Freq: Every day | ORAL | Status: DC
  Administered 2022-10-09: 25 mg via ORAL
  Filled 2022-10-08: qty 1

## 2022-10-08 MED ORDER — PHENYLEPHRINE 80 MCG/ML (10ML) SYRINGE FOR IV PUSH (FOR BLOOD PRESSURE SUPPORT)
PREFILLED_SYRINGE | INTRAVENOUS | Status: AC
  Filled 2022-10-08: qty 10

## 2022-10-08 MED ORDER — ADULT MULTIVITAMIN W/MINERALS CH
1.0000 | ORAL_TABLET | Freq: Every day | ORAL | Status: DC
  Administered 2022-10-09: 1 via ORAL
  Filled 2022-10-08: qty 1

## 2022-10-08 MED ORDER — VANCOMYCIN HCL IN DEXTROSE 1-5 GM/200ML-% IV SOLN: 1000.0000 mg | Freq: Once | INTRAVENOUS | Status: AC

## 2022-10-08 MED ORDER — PANTOPRAZOLE SODIUM 40 MG PO TBEC
40.0000 mg | DELAYED_RELEASE_TABLET | Freq: Every day | ORAL | Status: DC
  Administered 2022-10-09: 40 mg via ORAL
  Filled 2022-10-08: qty 1

## 2022-10-08 MED ORDER — CEFAZOLIN SODIUM-DEXTROSE 2-4 GM/100ML-% IV SOLN
2.0000 g | INTRAVENOUS | Status: AC
  Administered 2022-10-08: 2 g via INTRAVENOUS
  Filled 2022-10-08: qty 100

## 2022-10-08 SURGICAL SUPPLY — 74 items
AID PSTN UNV HD RSTRNT DISP (MISCELLANEOUS) ×1
BAG COUNTER SPONGE SURGICOUNT (BAG) IMPLANT
BAG SPEC THK2 15X12 ZIP CLS (MISCELLANEOUS)
BAG SPNG CNTER NS LX DISP (BAG)
BAG ZIPLOCK 12X15 (MISCELLANEOUS) IMPLANT
BIT DRILL 1.6MX128 (BIT) IMPLANT
BIT DRILL 170X2.5X (BIT) IMPLANT
BIT DRL 170X2.5X (BIT) ×1
BLADE SAG 18X100X1.27 (BLADE) ×1 IMPLANT
COVER BACK TABLE 60X90IN (DRAPES) ×1 IMPLANT
COVER SURGICAL LIGHT HANDLE (MISCELLANEOUS) ×1 IMPLANT
CUP HUMERAL 42 PLUS 3 (Orthopedic Implant) IMPLANT
DRAPE INCISE IOBAN 66X45 STRL (DRAPES) ×1 IMPLANT
DRAPE ORTHO SPLIT 77X108 STRL (DRAPES) ×2
DRAPE SHEET LG 3/4 BI-LAMINATE (DRAPES) ×1 IMPLANT
DRAPE SURG ORHT 6 SPLT 77X108 (DRAPES) ×2 IMPLANT
DRAPE TOP 10253 STERILE (DRAPES) ×1 IMPLANT
DRAPE U-SHAPE 47X51 STRL (DRAPES) ×1 IMPLANT
DRILL 2.5 (BIT) ×1
DRSG ADAPTIC 3X8 NADH LF (GAUZE/BANDAGES/DRESSINGS) ×1 IMPLANT
DURAPREP 26ML APPLICATOR (WOUND CARE) ×1 IMPLANT
ELECT BLADE TIP CTD 4 INCH (ELECTRODE) ×1 IMPLANT
ELECT NDL TIP 2.8 STRL (NEEDLE) ×1 IMPLANT
ELECT NEEDLE TIP 2.8 STRL (NEEDLE) ×1 IMPLANT
ELECT REM PT RETURN 15FT ADLT (MISCELLANEOUS) ×1 IMPLANT
EPIPHYSI RIGHT SZ 2 (Shoulder) ×1 IMPLANT
EPIPHYSIS RIGHT SZ 2 (Shoulder) IMPLANT
FACESHIELD WRAPAROUND (MASK) ×1 IMPLANT
FACESHIELD WRAPAROUND OR TEAM (MASK) ×1 IMPLANT
GAUZE PAD ABD 7.5X8 STRL (GAUZE/BANDAGES/DRESSINGS) IMPLANT
GAUZE PAD ABD 8X10 STRL (GAUZE/BANDAGES/DRESSINGS) ×1 IMPLANT
GAUZE SPONGE 4X4 12PLY STRL (GAUZE/BANDAGES/DRESSINGS) ×1 IMPLANT
GLENOSPHERE XTEND LAT 42+0 STD (Miscellaneous) IMPLANT
GLOVE BIOGEL PI IND STRL 7.5 (GLOVE) ×1 IMPLANT
GLOVE BIOGEL PI IND STRL 8.5 (GLOVE) ×1 IMPLANT
GLOVE ORTHO TXT STRL SZ7.5 (GLOVE) ×1 IMPLANT
GLOVE SURG ORTHO 8.5 STRL (GLOVE) ×1 IMPLANT
GOWN STRL REUS W/ TWL XL LVL3 (GOWN DISPOSABLE) ×2 IMPLANT
GOWN STRL REUS W/TWL XL LVL3 (GOWN DISPOSABLE) ×2
KIT BASIN OR (CUSTOM PROCEDURE TRAY) ×1 IMPLANT
KIT TURNOVER KIT A (KITS) IMPLANT
MANIFOLD NEPTUNE II (INSTRUMENTS) ×1 IMPLANT
METAGLENE DELTA EXTEND (Trauma) IMPLANT
METAGLENE DXTEND (Trauma) ×1 IMPLANT
NDL MAYO CATGUT SZ4 TPR NDL (NEEDLE) ×1 IMPLANT
NEEDLE MAYO CATGUT SZ4 (NEEDLE) ×1 IMPLANT
NS IRRIG 1000ML POUR BTL (IV SOLUTION) ×1 IMPLANT
PACK SHOULDER (CUSTOM PROCEDURE TRAY) ×1 IMPLANT
PIN GUIDE 1.2 (PIN) IMPLANT
PIN GUIDE GLENOPHERE 1.5MX300M (PIN) IMPLANT
PIN METAGLENE 2.5 (PIN) IMPLANT
PROTECTOR NERVE ULNAR (MISCELLANEOUS) ×1 IMPLANT
RESTRAINT HEAD UNIVERSAL NS (MISCELLANEOUS) ×1 IMPLANT
SCREW 4.5X24MM (Screw) ×1 IMPLANT
SCREW 4.5X36MM (Screw) IMPLANT
SCREW BN 24X4.5XLCK STRL (Screw) IMPLANT
SCREW LOCK 42 (Screw) IMPLANT
SLING ARM FOAM STRAP LRG (SOFTGOODS) IMPLANT
SMARTMIX MINI TOWER (MISCELLANEOUS)
SPIKE FLUID TRANSFER (MISCELLANEOUS) ×1 IMPLANT
SPONGE T-LAP 4X18 ~~LOC~~+RFID (SPONGE) ×1 IMPLANT
STEM 12 HA (Stem) IMPLANT
STRIP CLOSURE SKIN 1/2X4 (GAUZE/BANDAGES/DRESSINGS) ×1 IMPLANT
SUCTION FRAZIER HANDLE 10FR (MISCELLANEOUS) ×1
SUCTION TUBE FRAZIER 10FR DISP (MISCELLANEOUS) ×1 IMPLANT
SUT FIBERWIRE #2 38 T-5 BLUE (SUTURE) ×2
SUT MNCRL AB 4-0 PS2 18 (SUTURE) ×1 IMPLANT
SUT VIC AB 0 CT1 36 (SUTURE) ×2 IMPLANT
SUT VIC AB 0 CT2 27 (SUTURE) ×1 IMPLANT
SUT VIC AB 2-0 CT1 27 (SUTURE) ×1
SUT VIC AB 2-0 CT1 TAPERPNT 27 (SUTURE) ×1 IMPLANT
SUTURE FIBERWR #2 38 T-5 BLUE (SUTURE) ×2 IMPLANT
TOWEL OR 17X26 10 PK STRL BLUE (TOWEL DISPOSABLE) ×1 IMPLANT
TOWER SMARTMIX MINI (MISCELLANEOUS) IMPLANT

## 2022-10-08 NOTE — Anesthesia Procedure Notes (Signed)
Procedure Name: Intubation Date/Time: 10/08/2022 2:20 PM  Performed by: Gerald Leitz, CRNAPre-anesthesia Checklist: Patient identified, Patient being monitored, Timeout performed, Emergency Drugs available and Suction available Patient Re-evaluated:Patient Re-evaluated prior to induction Oxygen Delivery Method: Circle system utilized Preoxygenation: Pre-oxygenation with 100% oxygen Induction Type: IV induction Ventilation: Mask ventilation without difficulty Laryngoscope Size: Mac and 3 Grade View: Grade I Tube type: Oral Tube size: 7.5 mm Number of attempts: 1 Airway Equipment and Method: Stylet Placement Confirmation: ETT inserted through vocal cords under direct vision, positive ETCO2 and breath sounds checked- equal and bilateral Secured at: 23 cm Tube secured with: Tape Dental Injury: Teeth and Oropharynx as per pre-operative assessment

## 2022-10-08 NOTE — Discharge Instructions (Signed)
Ice to the shoulder constantly.  Keep the incision covered and clean and dry for one week, then ok to get it wet in the shower.  Do exercise as instructed several times per day.  DO NOT reach behind your back or push up out of a chair with the operative arm.  Use a sling while you are up and around for comfort, may remove while seated.  Keep pillow propped behind the operative elbow.  Follow up with Dr Veverly Fells in two weeks in the office, call [phone] for appt  Please call Dr Veverly Fells [phone] (cell) with any questions or concerns

## 2022-10-08 NOTE — Anesthesia Procedure Notes (Signed)
Anesthesia Regional Block: Interscalene brachial plexus block   Pre-Anesthetic Checklist: , timeout performed,  Correct Patient, Correct Site, Correct Laterality,  Correct Procedure, Correct Position, site marked,  Risks and benefits discussed,  Pre-op evaluation,  At surgeon's request and post-op pain management  Laterality: Right  Prep: Maximum Sterile Barrier Precautions used, chloraprep       Needles:  Injection technique: Single-shot  Needle Type: Echogenic Stimulator Needle     Needle Length: 5cm  Needle Gauge: 21     Additional Needles:   Procedures:,,,, ultrasound used (permanent image in chart),,    Narrative:  Start time: 10/08/2022 1:10 PM End time: 10/08/2022 1:13 PM Injection made incrementally with aspirations every 5 mL. Anesthesiologist: Freddrick March, MD

## 2022-10-08 NOTE — Transfer of Care (Signed)
Immediate Anesthesia Transfer of Care Note  [name]  Procedure(s) Performed: Procedure(s) with comments: REVERSE SHOULDER ARTHROPLASTY (Right) - general, choice with interscalene block  Patient Location: PACU  Anesthesia Type:General  Level of Consciousness: Alert, Awake, Oriented  Airway & Oxygen Therapy: Patient Spontanous Breathing  Post-op Assessment: Report given to RN  Post vital signs: Reviewed and stable  Last Vitals:  Vitals:   10/08/22 1309 10/08/22 1314  BP: (!) 144/77 136/82  Pulse: (!) 50 (!) 47  Resp: 13 14  Temp:    SpO2: [id number]    Complications: No apparent anesthesia complications

## 2022-10-08 NOTE — Anesthesia Postprocedure Evaluation (Signed)
Anesthesia Post Note  Patient: [name]  Procedure(s) Performed: REVERSE SHOULDER ARTHROPLASTY (Right: Shoulder)     Patient location during evaluation: PACU Anesthesia Type: Regional and General Level of consciousness: awake and alert Pain management: pain level controlled Vital Signs Assessment: post-procedure vital signs reviewed and stable Respiratory status: spontaneous breathing, nonlabored ventilation, respiratory function stable and patient connected to nasal cannula oxygen Cardiovascular status: blood pressure returned to baseline and stable Postop Assessment: no apparent nausea or vomiting Anesthetic complications: no  No notable events documented.  Last Vitals:  Vitals:   10/08/22 1715 10/08/22 1741  BP: 118/70 122/74  Pulse: 61 (!) 56  Resp: 18 15  Temp:  36.6 C  SpO2: 96% 98%    Last Pain:  Vitals:   10/08/22 1741  TempSrc: Oral  PainSc:                  Staphanie Harbison L Dyane Broberg

## 2022-10-08 NOTE — Plan of Care (Signed)
°  Problem: Pain Management: °Goal: Pain level will decrease with appropriate interventions °Outcome: Progressing °  °Problem: Coping: °Goal: Level of anxiety will decrease °Outcome: Progressing °  °Problem: Safety: °Goal: Ability to remain free from injury will improve °Outcome: Progressing °  °

## 2022-10-08 NOTE — Interval H&P Note (Signed)
History and Physical Interval Note:  10/08/2022 12:10 PM  Brandon [name]  has presented today for surgery, with the diagnosis of right shoulder osteoarthritis.  The various methods of treatment have been discussed with the patient and family. After consideration of risks, benefits and other options for treatment, the patient has consented to  Procedure(s) with comments: REVERSE SHOULDER ARTHROPLASTY (Right) - general, choice with interscalene block as a surgical intervention.  The patient's history has been reviewed, patient examined, no change in status, stable for surgery.  I have reviewed the patient's chart and labs.  Questions were answered to the patient's satisfaction.     Augustin Schooling

## 2022-10-08 NOTE — Brief Op Note (Signed)
10/08/2022  4:11 PM  PATIENT:  Brandon [name]  77 y.o. male  PRE-OPERATIVE DIAGNOSIS:  right shoulder osteoarthritis, end stage  POST-OPERATIVE DIAGNOSIS:  right shoulder osteoarthritis, end stage  PROCEDURE:  Procedure(s) with comments: REVERSE SHOULDER ARTHROPLASTY (Right) - general, choice with interscalene block DePuy Delta Extend WITH subscap repair  SURGEON:  Surgeon(s) and Role:    Netta Cedars, MD - Primary  PHYSICIAN ASSISTANT:   ASSISTANTS: Ventura Bruns, PA-C   ANESTHESIA:   regional and general  EBL:  170 mL   BLOOD ADMINISTERED:none  DRAINS: none   LOCAL MEDICATIONS USED:  MARCAINE     SPECIMEN:  No Specimen  DISPOSITION OF SPECIMEN:  N/A  COUNTS:  YES  TOURNIQUET:  * No tourniquets in log *  DICTATION: .Other Dictation: Dictation Number [id number]  PLAN OF CARE: Admit for overnight observation  PATIENT DISPOSITION:  PACU - hemodynamically stable.   Delay start of Pharmacological VTE agent (>24hrs) due to surgical blood loss or risk of bleeding: not applicable

## 2022-10-08 NOTE — Op Note (Signed)
NAME: Brandon [name], Brandon [name] MEDICAL RECORD NO: [id number] ACCOUNT NO: [id number] DATE OF BIRTH: 1946/06/15 FACILITY: [facility] LOCATION: [facility] PHYSICIAN: Doran Heater. Veverly Fells, MD  Operative Report   DATE OF PROCEDURE: 10/08/2022  PREOPERATIVE DIAGNOSIS:  Right shoulder end-stage arthritis.  POSTOPERATIVE DIAGNOSIS:  Right shoulder end-stage arthritis.  PROCEDURE PERFORMED:  Right reverse total shoulder arthroplasty using DePuy Delta Xtend prosthesis with subscap repair.  ATTENDING SURGEON:  Doran Heater. Veverly Fells, MD  ASSISTANT:  Charletta Cousin [place], [place], who was scrubbed during the entire procedure, and necessary for satisfactory completion of surgery.  ANESTHESIA:  General anesthesia was used plus interscalene block.  ESTIMATED BLOOD LOSS:  100 mL  FLUID REPLACEMENT:  1200 mL crystalloid.  COUNTS:  Instrument counts correct.  COMPLICATIONS:  No complications.  ANTIBIOTICS:  Perioperative antibiotics were given.  INDICATIONS:  The patient is a 77 year old male with worsening right shoulder pain secondary to end-stage arthritis.  The patient has had progressive decline in function and increasing pain despite conservative treatment.  He would like to move forward  with surgical arthroplasty for the shoulder to eliminate pain and restore function.  Informed consent obtained.  DESCRIPTION OF PROCEDURE:  After an adequate level of general anesthesia was achieved, plus interscalene block the patient was positioned in modified beach chair position.  Right shoulder correctly identified and sterilely prepped and draped in the usual  manner.  Timeout called, verifying correct patient, correct site. We entered the shoulder using a standard deltopectoral approach, starting at the coracoid process extending down to the anterior humerus.  Dissection down through subcutaneous tissues  using Bovie.  Cephalic vein identified, taken laterally with the deltoid, pectoralis taken medially.  Conjoined tendon  identified and retracted. Deep retractors placed.  Biceps tenodesed in situ with 0 Vicryl figure-of-eight suture x2.  We then released  the subscapularis subperiosteally off the lesser tuberosity and tagged for repair with #2 FiberWire suture in a modified Mason-Allen suture technique.  We then released the inferior capsule extending the shoulder and delivering the humeral head out of  the wound.  We released the portion of the cuff that we needed to for exposure.  We entered the proximal humerus with a 6 mm reamer, we reamed up to a size 12.  We then placed our 12 mm T-handle guide and resected the head at 20 degrees of retroversion.   Head was devoid of cartilage.  Large osteophytes noted, which were removed using rongeurs.  Once we removed the medial osteophytes, we subluxed the humerus posteriorly, gaining good exposure of the glenoid face.  We removed the glenoid capsule, labrum,  biceps anchor.  Once we had a 360 release around the glenoid we placed our guide pin centered low on the glenoid surface, angled slightly inferiorly.  We then reamed for the metaglene baseplate, did our peripheral hand reaming with the T-handle reamer  and then drilled our central peg hole.  We then impacted the HA coated press-fit baseplate into position.  We then placed a 42 screw inferiorly, a 36 screw superiorly and a 24 screw and locked all 3 and that 24 screw was posteriorly.  We had excellent  baseplate stability.  We then went to a 42+0 standard glenosphere and attached that to the baseplate with a screwdriver.  Once we had that in place and tight.  I did a finger sweep to make sure we had no soft tissue caught up in the baseplate glenosphere  bearing.  We then went to the humeral side and reamed for the  2 right metaphysis.  Next, we trialled with the 12 stem with the 2 right set on the 0 setting and placed in 20 degrees of retroversion.  We used a 42+3 poly trial placed that on the humeral  tray, reduced the shoulder.  We are happy with our soft tissue balancing, appropriate conjoined tension, no instability.  We removed all the trial components, irrigated thoroughly, drilled holes in lesser tuberosity, placed #2 FiberWire suture for repair  of the subscap.  We then used HA coated press fit a 12 stem and 2 right metaphysis.  We used available bone graft and used impaction grafting technique to impact the stem in a press-fit manner.  Once we had that stem in place and that was secured, we  went ahead and selected the real 42+3 poly and placed that on the humeral tray and impacted it.  We then reduced the shoulder, had nice little pop as it reduced.  Appropriate tension on the conjoined.  No gapping with inferior pole or external rotation.   We irrigated thoroughly and then repaired the subscapularis anatomically back to bone.  This did not restrict range of motion.  We then irrigated again and then repaired deltopectoral interval with 0 Vicryl suture followed by 2-0 Vicryl for subcutaneous  closure and 4-0 Monocryl for skin.  Steri-Strips applied followed by sterile dressing.  The patient tolerated surgery well.   PUS D: 10/08/2022 4:16:34 pm T: 10/08/2022 5:16:00 pm  JOB: [id number]

## 2022-10-09 DIAGNOSIS — Z951 Presence of aortocoronary bypass graft: Secondary | ICD-10-CM | POA: Diagnosis not present

## 2022-10-09 DIAGNOSIS — Z8616 Personal history of COVID-19: Secondary | ICD-10-CM | POA: Diagnosis not present

## 2022-10-09 DIAGNOSIS — M19011 Primary osteoarthritis, right shoulder: Secondary | ICD-10-CM | POA: Diagnosis not present

## 2022-10-09 DIAGNOSIS — Z85828 Personal history of other malignant neoplasm of skin: Secondary | ICD-10-CM | POA: Diagnosis not present

## 2022-10-09 DIAGNOSIS — I10 Essential (primary) hypertension: Secondary | ICD-10-CM | POA: Diagnosis not present

## 2022-10-09 DIAGNOSIS — I2510 Atherosclerotic heart disease of native coronary artery without angina pectoris: Secondary | ICD-10-CM | POA: Diagnosis not present

## 2022-10-09 LAB — BASIC METABOLIC PANEL
Anion gap: 6 (ref 5–15)
BUN: 13 mg/dL (ref 8–23)
CO2: 21 mmol/L — ABNORMAL LOW (ref 22–32)
Calcium: 8.1 mg/dL — ABNORMAL LOW (ref 8.9–10.3)
Chloride: 107 mmol/L (ref 98–111)
Creatinine, Ser: 0.83 mg/dL (ref 0.61–1.24)
GFR, Estimated: >60 mL/min (ref >60)
Glucose, Bld: 153 mg/dL — ABNORMAL HIGH (ref 70–99)
Potassium: 3.8 mmol/L (ref 3.5–5.1)
Sodium: 134 mmol/L — ABNORMAL LOW (ref 135–145)

## 2022-10-09 LAB — HEMOGLOBIN AND HEMATOCRIT, BLOOD
HCT: 38 % — ABNORMAL LOW (ref 39.0–52.0)
Hemoglobin: 12.3 g/dL — ABNORMAL LOW (ref 13.0–17.0)

## 2022-10-09 NOTE — Progress Notes (Signed)
Orthopedics Progress Note  Subjective: Patient with no pain this AM. Arm still weak  Objective:  Vitals:   10/09/22 0136 10/09/22 0542  BP: 127/83 113/69  Pulse: 75 (!) 57  Resp: 16 18  Temp: 98.9 F (37.2 C) 98.7 F (37.1 C)  SpO2: 95% 92%    General: Awake and alert  Musculoskeletal: Right shoulder dressing changed, wound looks good Neurovascularly intact  Lab Results  Component Value Date   WBC 8.0 09/24/2022   HGB 12.3 (L) 10/09/2022   HCT 38.0 (L) 10/09/2022   MCV 94.7 09/24/2022   PLT 246 09/24/2022       Component Value Date/Time   NA 134 (L) 10/09/2022 0328   NA 138 06/28/2019 0000   K 3.8 10/09/2022 0328   CL 107 10/09/2022 0328   CO2 21 (L) 10/09/2022 0328   GLUCOSE 153 (H) 10/09/2022 0328   BUN 13 10/09/2022 0328   BUN 20 06/28/2019 0000   CREATININE 0.83 10/09/2022 0328   CALCIUM 8.1 (L) 10/09/2022 0328   GFRNONAA >60 10/09/2022 0328   GFRAA >60 03/19/2020 1218    Lab Results  Component Value Date   INR 0.9 01/24/2020   INR 1.27 10/19/2013   INR 0.85 10/18/2013    Assessment/Plan: POD #1 s/p Procedure(s): REVERSE SHOULDER ARTHROPLASTY Stable overnight. Discharge to home after therapy. Follow up in two weeks in the office  Remo Lipps R. Veverly Fells, MD 10/09/2022 6:46 AM

## 2022-10-09 NOTE — Plan of Care (Signed)
  Problem: Education: Goal: Knowledge of General Education information will improve Description Including pain rating scale, medication(s)/side effects and non-pharmacologic comfort measures Outcome: Progressing   

## 2022-10-09 NOTE — Evaluation (Signed)
Occupational Therapy Evaluation Patient Details Name: Brandon [name] MRN: [id number] DOB: [birth year] Today's Date: 10/09/2022   History of Present Illness Patient s/p right reverse athroplasty with subscap repair   Clinical Impression   Mr. Aleksei Dembo is a 77 year old man s/p shoulder replacement without functional use of right dominant upper extremity secondary to effects of surgery and interscalene block and shoulder precautions. Therapist provided education and instruction to patient and spouse in regards to exercises, precautions, positioning, donning upper extremity clothing and bathing while maintaining shoulder precautions, ice and edema management and donning/doffing sling. Patient to follow up with MD for further therapy needs.        Recommendations for follow up therapy are one component of a multi-disciplinary discharge planning process, led by the attending physician.  Recommendations may be updated based on patient status, additional functional criteria and insurance authorization.   Follow Up Recommendations  Follow physician's recommendations for discharge plan and follow up therapies     Assistance Recommended at Discharge Intermittent Supervision/Assistance  Patient can return home with the following A little help with bathing/dressing/bathroom;Assistance with cooking/housework    Functional Status Assessment  Patient has had a recent decline in their functional status and demonstrates the ability to make significant improvements in function in a reasonable and predictable amount of time.  Equipment Recommendations  None recommended by OT    Recommendations for Other Services       Precautions / Restrictions Precautions Precautions: Shoulder Type of Shoulder Precautions: ER limited to 0 degress and FF to 90 to protect subscap, keep everything in front and light Shoulder Interventions: Shoulder sling/immobilizer;Off for dressing/bathing/exercises (can come out  of sling when seated and arm supported) Required Braces or Orthoses: Sling Restrictions Weight Bearing Restrictions: Yes RUE Weight Bearing: Non weight bearing      Mobility Bed Mobility Overal bed mobility: Modified Independent                  Transfers Overall transfer level: Modified independent                 General transfer comment: Recommended use of cane for ambulation if needed      Balance Overall balance assessment: Mild deficits observed, not formally tested                                         ADL either performed or assessed with clinical judgement   ADL                                               Vision Baseline Vision/History: 1 Wears glasses       Perception     Praxis      Pertinent Vitals/Pain Pain Assessment Pain Assessment: No/denies pain     Hand Dominance Right   Extremity/Trunk Assessment Upper Extremity Assessment Upper Extremity Assessment: RUE deficits/detail RUE Deficits / Details: impaired sensation and motor control secondary to block   Lower Extremity Assessment Lower Extremity Assessment: Overall WFL for tasks assessed   Cervical / Trunk Assessment Cervical / Trunk Assessment: Normal   Communication Communication Communication: HOH   Cognition Arousal/Alertness: Awake/alert Behavior During Therapy: WFL for tasks assessed/performed Overall Cognitive Status: Within Functional Limits for tasks assessed  General Comments: Had some forgetfulness with instructions. Caregiver verbalized understanding.     General Comments       Exercises     Shoulder Instructions Shoulder Instructions Donning/doffing shirt without moving shoulder: Caregiver independent with task Method for sponge bathing under operated UE: Caregiver independent with task Donning/doffing sling/immobilizer: Caregiver independent with task Correct  positioning of sling/immobilizer: Caregiver independent with task ROM for elbow, wrist and digits of operated UE: Caregiver independent with task Sling wearing schedule (on at all times/off for ADL's): Caregiver independent with task Proper positioning of operated UE when showering: Caregiver independent with task Dressing change: Caregiver independent with task Positioning of UE while sleeping: Caregiver independent with task    Home Living Family/patient expects to be discharged to:: Private residence Living Arrangements: Spouse/significant other Available Help at Discharge: Family               Bathroom Shower/Tub: Walk-in Corporate treasurer Toilet: Handicapped height     Home Equipment: Conservation officer, nature (2 wheels);Cane - single point          Prior Functioning/Environment Prior Level of Function : Independent/Modified Independent                        OT Problem List: Decreased strength;Decreased range of motion;Impaired UE functional use      OT Treatment/Interventions:      OT Goals(Current goals can be found in the care plan section) Acute Rehab OT Goals OT Goal Formulation: All assessment and education complete, DC therapy  OT Frequency:      Co-evaluation              AM-PAC OT "6 Clicks" Daily Activity     Outcome Measure Help from another person eating meals?: A Little Help from another person taking care of personal grooming?: A Little Help from another person toileting, which includes using toliet, bedpan, or urinal?: A Little Help from another person bathing (including washing, rinsing, drying)?: A Little Help from another person to put on and taking off regular upper body clothing?: A Lot Help from another person to put on and taking off regular lower body clothing?: A Little 6 Click Score: 17   End of Session Nurse Communication:  (OT education complete)  Activity Tolerance: Patient tolerated treatment well Patient left: in bed;with  call bell/phone within reach;with family/visitor present  OT Visit Diagnosis: Pain                Time: [id number] OT Time Calculation (min): 23 min Charges:  OT General Charges $OT Visit: 1 Visit OT Evaluation $OT Eval Low Complexity: 1 Low OT Treatments $Self Care/Home Management : 8-22 mins  Gustavo Lah, OTR/L Acute Care Rehab Services  Office [phone]   Lenward Chancellor 10/09/2022, 10:15 AM

## 2022-10-09 NOTE — Discharge Summary (Signed)
In most cases prophylactic antibiotics for Dental procdeures after total joint surgery are not necessary.  Exceptions are as follows:  1. History of prior total joint infection  2. Severely immunocompromised (Organ Transplant, cancer chemotherapy, Rheumatoid biologic meds such as Strang)  3. Poorly controlled diabetes (A1C &gt; 8.0, blood glucose over 200)  If you have one of these conditions, contact your surgeon for an antibiotic prescription, prior to your dental procedure. Orthopedic Discharge Summary        Physician Discharge Summary  Patient ID: Brandon [name] MRN: [id number] DOB/AGE: 02-05-1946 77 y.o.  Admit date: 10/08/2022 Discharge date: 10/09/2022   Procedures:  Procedure(s) (LRB): REVERSE SHOULDER ARTHROPLASTY (Right)  Attending Physician:  Dr. Esmond Plants  Admission Diagnoses:   Right shoulder OA  Discharge Diagnoses:  same   Past Medical History:  Diagnosis Date   Arthritis    Coronary atherosclerosis of native coronary artery    Multivessel status post CABG March 2015   DDD (degenerative disc disease), lumbar    Essential hypertension    GERD (gastroesophageal reflux disease)    Hard of hearing both ears    Left ear is worst   Hemorrhoids    History of gout    History of kidney stones    history of myasthenia Gravis 2021   History of [facility] spotted fever    Left ureteral  & renal stone    Lumbago    Mixed hyperlipidemia    Personal history of COVID-19 08/10/2021   Paxlovid   Plantar fascial fibromatosis    Postoperative atrial fibrillation (HCC)    Skin Cancer (HCC)    Use of cane as ambulatory aid    Uses roller walker    UTI (urinary tract infection) 09/16/2021   Wears glasses     PCP: Sharilyn Sites, MD   Discharged Condition: good  Hospital Course:  Patient underwent the above stated procedure on 10/08/2022. Patient tolerated the procedure well and brought to the recovery room in good condition and subsequently to  the floor. Patient had an uncomplicated hospital course and was stable for discharge.   Disposition: Discharge disposition: 01-Home or Self Care      with follow up in 2 weeks    Follow-up Information     Netta Cedars, MD. Call in 2 week(s).   Specialty: Orthopedic Surgery Why: call [phone] for appt Contact information: [address] [place] [address] [id number]                 Dental Antibiotics:  In most cases prophylactic antibiotics for Dental procdeures after total joint surgery are not necessary.  Exceptions are as follows:  1. History of prior total joint infection  2. Severely immunocompromised (Organ Transplant, cancer chemotherapy, Rheumatoid biologic meds such as Lander)  3. Poorly controlled diabetes (A1C &gt; 8.0, blood glucose over 200)  If you have one of these conditions, contact your surgeon for an antibiotic prescription, prior to your dental procedure.  Discharge Instructions     Call MD / Call 911   Complete by: As directed    If you experience chest pain or shortness of breath, CALL 911 and be transported to the hospital emergency room.  If you develope a fever above 101 F, pus (white drainage) or increased drainage or redness at the wound, or calf pain, call your surgeon's office.   Constipation Prevention   Complete by: As directed    Drink plenty of fluids.  Prune  juice may be helpful.  You may use a stool softener, such as Colace (over the counter) 100 mg twice a day.  Use MiraLax (over the counter) for constipation as needed.   Diet - low sodium heart healthy   Complete by: As directed    Increase activity slowly as tolerated   Complete by: As directed    Post-operative opioid taper instructions:   Complete by: As directed    POST-OPERATIVE OPIOID TAPER INSTRUCTIONS: It is important to wean off of your opioid medication as soon as possible. If you do not need pain medication after your surgery it is ok  to stop day one. Opioids include: Codeine, Hydrocodone(Norco, Vicodin), Oxycodone(Percocet, oxycontin) and hydromorphone amongst others.  Long term and even short term use of opiods can cause: Increased pain response Dependence Constipation Depression Respiratory depression And more.  Withdrawal symptoms can include Flu like symptoms Nausea, vomiting And more Techniques to manage these symptoms Hydrate well Eat regular healthy meals Stay active Use relaxation techniques(deep breathing, meditating, yoga) Do Not substitute Alcohol to help with tapering If you have been on opioids for less than two weeks and do not have pain than it is ok to stop all together.  Plan to wean off of opioids This plan should start within one week post op of your joint replacement. Maintain the same interval or time between taking each dose and first decrease the dose.  Cut the total daily intake of opioids by one tablet each day Next start to increase the time between doses. The last dose that should be eliminated is the evening dose.          Allergies as of 10/09/2022       Reactions   Gabapentin Other (See Comments)   Hallucinations        Medication List     TAKE these medications    allopurinol 300 MG tablet Commonly known as: ZYLOPRIM Take 150 mg by mouth in the morning.   amLODipine 5 MG tablet Commonly known as: NORVASC Take 1 tablet (5 mg total) by mouth daily.   aspirin EC 81 MG tablet Take 1 tablet (81 mg total) by mouth daily. Swallow whole.   CALCIUM 600+D3 PO Take 1 tablet by mouth in the morning and at bedtime.   Fish Oil 500 MG Caps Take 500 mg by mouth in the morning.   Gemtesa 75 MG Tabs Generic drug: Vibegron Take 75 mg by mouth daily.   HYDROcodone-acetaminophen 5-325 MG tablet Commonly known as: NORCO/VICODIN Take 1-2 tablets by mouth every 4 (four) hours as needed (for pain). What changed: how much to take   metoprolol tartrate 25 MG  tablet Commonly known as: LOPRESSOR Take 12.5 mg by mouth 2 (two) times daily.   Mucinex Maximum Strength 1200 MG Tb12 Generic drug: Guaifenesin Take 1,200 mg by mouth See admin instructions. Take 1200 mg in the morning, may take a second 1200 mg dose 12 hours later as needed for congestion   multivitamin tablet Take 1 tablet by mouth daily.   nitroGLYCERIN 0.4 MG SL tablet Commonly known as: NITROSTAT Place 0.4 mg under the tongue every 5 (five) minutes as needed for chest pain.   Osteo Bi-Flex Adv Triple St Tabs Take 1 tablet by mouth in the morning.   pantoprazole 40 MG tablet Commonly known as: PROTONIX Take 40 mg by mouth daily.   pravastatin 40 MG tablet Commonly known as: PRAVACHOL Take 40 mg by mouth every evening.   pyridostigmine 60  MG tablet Commonly known as: MESTINON Take 0.5 tablets (30 mg total) by mouth 3 (three) times daily. What changed: when to take this   tamsulosin 0.4 MG Caps capsule Commonly known as: FLOMAX Take 1 capsule by mouth daily.   vitamin C 1000 MG tablet Take 1,000 mg by mouth in the morning.          Signed: Augustin Schooling 10/09/2022, 6:47 AM  [facility] is now [facility]  [place] [address], [place], [place], [place] [address] Phone: [place]

## 2022-10-09 NOTE — Progress Notes (Signed)
The patient is alert and oriented and has been seen by his physician. The orders for discharge were written. IV has been removed. Went over discharge instructions with patient and family. Aquacel dressing is clean, dry, and intact. He is being discharged via wheelchair with all of his belongings.

## 2022-10-11 ENCOUNTER — Other Ambulatory Visit: Payer: Self-pay

## 2022-10-11 ENCOUNTER — Encounter (HOSPITAL_COMMUNITY): Payer: Self-pay | Admitting: Orthopedic Surgery

## 2022-10-25 DIAGNOSIS — Z4789 Encounter for other orthopedic aftercare: Secondary | ICD-10-CM | POA: Diagnosis not present

## 2022-11-22 DIAGNOSIS — N281 Cyst of kidney, acquired: Secondary | ICD-10-CM | POA: Diagnosis not present

## 2022-11-22 DIAGNOSIS — N3941 Urge incontinence: Secondary | ICD-10-CM | POA: Diagnosis not present

## 2022-11-22 DIAGNOSIS — N401 Enlarged prostate with lower urinary tract symptoms: Secondary | ICD-10-CM | POA: Diagnosis not present

## 2022-11-22 DIAGNOSIS — R351 Nocturia: Secondary | ICD-10-CM | POA: Diagnosis not present

## 2022-11-24 DIAGNOSIS — Z4789 Encounter for other orthopedic aftercare: Secondary | ICD-10-CM | POA: Diagnosis not present

## 2022-12-10 DIAGNOSIS — M25611 Stiffness of right shoulder, not elsewhere classified: Secondary | ICD-10-CM | POA: Diagnosis not present

## 2022-12-10 DIAGNOSIS — M25511 Pain in right shoulder: Secondary | ICD-10-CM | POA: Diagnosis not present

## 2022-12-13 DIAGNOSIS — M25611 Stiffness of right shoulder, not elsewhere classified: Secondary | ICD-10-CM | POA: Diagnosis not present

## 2022-12-13 DIAGNOSIS — M25511 Pain in right shoulder: Secondary | ICD-10-CM | POA: Diagnosis not present

## 2022-12-15 DIAGNOSIS — M25611 Stiffness of right shoulder, not elsewhere classified: Secondary | ICD-10-CM | POA: Diagnosis not present

## 2022-12-15 DIAGNOSIS — M25511 Pain in right shoulder: Secondary | ICD-10-CM | POA: Diagnosis not present

## 2022-12-17 DIAGNOSIS — Z961 Presence of intraocular lens: Secondary | ICD-10-CM | POA: Diagnosis not present

## 2022-12-17 DIAGNOSIS — G7000 Myasthenia gravis without (acute) exacerbation: Secondary | ICD-10-CM | POA: Diagnosis not present

## 2022-12-17 DIAGNOSIS — H02834 Dermatochalasis of left upper eyelid: Secondary | ICD-10-CM | POA: Diagnosis not present

## 2022-12-17 DIAGNOSIS — H02831 Dermatochalasis of right upper eyelid: Secondary | ICD-10-CM | POA: Diagnosis not present

## 2022-12-20 DIAGNOSIS — M25611 Stiffness of right shoulder, not elsewhere classified: Secondary | ICD-10-CM | POA: Diagnosis not present

## 2022-12-20 DIAGNOSIS — M25511 Pain in right shoulder: Secondary | ICD-10-CM | POA: Diagnosis not present

## 2022-12-21 DIAGNOSIS — E1142 Type 2 diabetes mellitus with diabetic polyneuropathy: Secondary | ICD-10-CM | POA: Diagnosis not present

## 2022-12-21 DIAGNOSIS — B351 Tinea unguium: Secondary | ICD-10-CM | POA: Diagnosis not present

## 2022-12-21 DIAGNOSIS — L84 Corns and callosities: Secondary | ICD-10-CM | POA: Diagnosis not present

## 2022-12-21 DIAGNOSIS — M79676 Pain in unspecified toe(s): Secondary | ICD-10-CM | POA: Diagnosis not present

## 2022-12-23 DIAGNOSIS — M25611 Stiffness of right shoulder, not elsewhere classified: Secondary | ICD-10-CM | POA: Diagnosis not present

## 2022-12-23 DIAGNOSIS — M25511 Pain in right shoulder: Secondary | ICD-10-CM | POA: Diagnosis not present

## 2023-01-03 DIAGNOSIS — M25611 Stiffness of right shoulder, not elsewhere classified: Secondary | ICD-10-CM | POA: Diagnosis not present

## 2023-01-03 DIAGNOSIS — M25511 Pain in right shoulder: Secondary | ICD-10-CM | POA: Diagnosis not present

## 2023-01-04 DIAGNOSIS — N3941 Urge incontinence: Secondary | ICD-10-CM | POA: Diagnosis not present

## 2023-01-07 DIAGNOSIS — M25611 Stiffness of right shoulder, not elsewhere classified: Secondary | ICD-10-CM | POA: Diagnosis not present

## 2023-01-07 DIAGNOSIS — M25511 Pain in right shoulder: Secondary | ICD-10-CM | POA: Diagnosis not present

## 2023-01-11 DIAGNOSIS — R350 Frequency of micturition: Secondary | ICD-10-CM | POA: Diagnosis not present

## 2023-01-11 DIAGNOSIS — R3915 Urgency of urination: Secondary | ICD-10-CM | POA: Diagnosis not present

## 2023-01-12 DIAGNOSIS — M25511 Pain in right shoulder: Secondary | ICD-10-CM | POA: Diagnosis not present

## 2023-01-12 DIAGNOSIS — M25611 Stiffness of right shoulder, not elsewhere classified: Secondary | ICD-10-CM | POA: Diagnosis not present

## 2023-01-14 DIAGNOSIS — M25511 Pain in right shoulder: Secondary | ICD-10-CM | POA: Diagnosis not present

## 2023-01-14 DIAGNOSIS — M25611 Stiffness of right shoulder, not elsewhere classified: Secondary | ICD-10-CM | POA: Diagnosis not present

## 2023-01-18 DIAGNOSIS — R350 Frequency of micturition: Secondary | ICD-10-CM | POA: Diagnosis not present

## 2023-01-18 DIAGNOSIS — R3915 Urgency of urination: Secondary | ICD-10-CM | POA: Diagnosis not present

## 2023-01-19 ENCOUNTER — Other Ambulatory Visit: Payer: Self-pay | Admitting: Cardiology

## 2023-01-21 DIAGNOSIS — M1711 Unilateral primary osteoarthritis, right knee: Secondary | ICD-10-CM | POA: Diagnosis not present

## 2023-01-24 DIAGNOSIS — M19021 Primary osteoarthritis, right elbow: Secondary | ICD-10-CM | POA: Diagnosis not present

## 2023-01-24 DIAGNOSIS — M180 Bilateral primary osteoarthritis of first carpometacarpal joints: Secondary | ICD-10-CM | POA: Diagnosis not present

## 2023-01-25 DIAGNOSIS — R350 Frequency of micturition: Secondary | ICD-10-CM | POA: Diagnosis not present

## 2023-01-25 DIAGNOSIS — R3915 Urgency of urination: Secondary | ICD-10-CM | POA: Diagnosis not present

## 2023-01-26 ENCOUNTER — Encounter: Payer: Self-pay | Admitting: Cardiology

## 2023-01-26 ENCOUNTER — Ambulatory Visit: Payer: Medicare Other | Attending: Cardiology | Admitting: Cardiology

## 2023-01-26 VITALS — BP 119/68 | HR 52 | Ht 73.0 in | Wt 185.2 lb

## 2023-01-26 DIAGNOSIS — E782 Mixed hyperlipidemia: Secondary | ICD-10-CM | POA: Insufficient documentation

## 2023-01-26 DIAGNOSIS — I25119 Atherosclerotic heart disease of native coronary artery with unspecified angina pectoris: Secondary | ICD-10-CM | POA: Diagnosis not present

## 2023-01-26 DIAGNOSIS — I10 Essential (primary) hypertension: Secondary | ICD-10-CM | POA: Diagnosis not present

## 2023-01-26 DIAGNOSIS — Z01810 Encounter for preprocedural cardiovascular examination: Secondary | ICD-10-CM | POA: Insufficient documentation

## 2023-01-26 NOTE — Progress Notes (Signed)
Cardiology Office Note  Date: 01/26/2023   ID: [name], DOB 18-May-1946, MRN [id number]  History of Present Illness: Brandon [name] is a 77 y.o. male seen in January by Mr. Louanne Skye NP, I reviewed the note.  He is here today with his wife for a follow-up visit.  Overall doing well from a cardiac perspective, no angina or nitroglycerin use, stable NYHA class II dyspnea.  He is scheduled to go right total knee arthroplasty under general anesthesia with Dr. Lequita Halt in October.  RCRI perioperative cardiac risk index is class II, 0.9% chance of major adverse cardiac event.  He tolerated right shoulder surgery earlier in the year without cardiac event.  ECG today shows sinus bradycardia.  I reviewed his medications which are stable from a cardiac perspective.  His last LDL was 63 in November 2023 on Pravachol.  Blood pressure is well-controlled today.  Physical Exam: VS:  BP 119/68   Pulse (!) 52   Ht 6\' 1"  (1.854 m)   Wt 185 lb 3.2 oz (84 kg)   SpO2 97%   BMI 24.43 kg/m , BMI Body mass index is 24.43 kg/m.  Wt Readings from Last 3 Encounters:  01/26/23 185 lb 3.2 oz (84 kg)  10/08/22 180 lb (81.6 kg)  09/24/22 180 lb (81.6 kg)    General: Patient appears comfortable at rest. HEENT: Conjunctiva and lids normal. Neck: Supple, no elevated JVP or carotid bruits. Lungs: Clear to auscultation, nonlabored breathing at rest. Cardiac: Regular rate and rhythm, no S3 or significant systolic murmur. Extremities: No pitting edema.  ECG:  An ECG dated 10/07/2021 was personally reviewed today and demonstrated:  Sinus bradycardia with occasional PVCs, old inferior infarct pattern.  Labwork: November 2023: Cholesterol 153, triglycerides 116, HDL 70, LDL 63, TSH 1.53 09/24/2022: Platelets 246 10/09/2022: BUN 13; Creatinine, Ser 0.83; Hemoglobin 12.3; Potassium 3.8; Sodium 134   Other Studies Reviewed Today:  Lexiscan Myoview 10/05/2021:   Findings are consistent with small prior  inferior/inferoseptal myocardial infarction with very mild peri-infarct ischemia. The study is low risk.   No ST deviation was noted.   There is a small mild intensity inferior/inferoseptal defect with very mild reversibility.   Left ventricular function is normal. Nuclear stress EF: 57 %. The left ventricular ejection fraction is normal (55-65%). End diastolic cavity size is normal.  Assessment and Plan:  1.  Multivessel CAD status post CABG in 2015 with LIMA to LAD, SVG to PDA, and sequential SVG to ramus intermedius and OM.  Lexiscan Myoview in February 2023 showed a small inferior/inferoseptal infarct scar with mild peri-infarct ischemia and LVEF 57%.  He does not report any significant angina or nitroglycerin use at this time.  ECG reviewed and stable.  Continue aspirin, Lopressor, Pravachol, and as needed nitroglycerin.  2.  Patient pending elective right total hip arthroplasty presumably under general anesthesia in October with Dr. Lequita Halt.  RCRI perioperative cardiac risk index is class II, 0.9% chance of major adverse cardiac event.  He already tolerated right shoulder surgery earlier in the year.  No further cardiac testing indicated at this time in the absence of new symptoms.  He should be able to proceed as planned unless there is a substantial change in cardiac status in the interim.  He may hold aspirin if requested preoperatively.  3.  Mixed hyperlipidemia.  LDL 63 in November 2023.  He continues on Pravachol.  4.  Essential hypertension.  He is also on Norvasc.  Blood pressure well-controlled today.  Disposition:  Follow up  6 months.  Signed, Jonelle Sidle, M.D., F.A.C.C. [facility]

## 2023-01-26 NOTE — Patient Instructions (Addendum)

## 2023-02-01 DIAGNOSIS — R3915 Urgency of urination: Secondary | ICD-10-CM | POA: Diagnosis not present

## 2023-02-01 DIAGNOSIS — R350 Frequency of micturition: Secondary | ICD-10-CM | POA: Diagnosis not present

## 2023-02-08 DIAGNOSIS — R3915 Urgency of urination: Secondary | ICD-10-CM | POA: Diagnosis not present

## 2023-02-08 DIAGNOSIS — R350 Frequency of micturition: Secondary | ICD-10-CM | POA: Diagnosis not present

## 2023-02-12 DIAGNOSIS — M79642 Pain in left hand: Secondary | ICD-10-CM | POA: Diagnosis not present

## 2023-02-12 DIAGNOSIS — R52 Pain, unspecified: Secondary | ICD-10-CM | POA: Diagnosis not present

## 2023-02-18 DIAGNOSIS — R350 Frequency of micturition: Secondary | ICD-10-CM | POA: Diagnosis not present

## 2023-02-18 DIAGNOSIS — R3915 Urgency of urination: Secondary | ICD-10-CM | POA: Diagnosis not present

## 2023-02-21 DIAGNOSIS — N281 Cyst of kidney, acquired: Secondary | ICD-10-CM | POA: Diagnosis not present

## 2023-02-21 DIAGNOSIS — N401 Enlarged prostate with lower urinary tract symptoms: Secondary | ICD-10-CM | POA: Diagnosis not present

## 2023-02-21 DIAGNOSIS — R351 Nocturia: Secondary | ICD-10-CM | POA: Diagnosis not present

## 2023-02-21 DIAGNOSIS — R3915 Urgency of urination: Secondary | ICD-10-CM | POA: Diagnosis not present

## 2023-02-24 DIAGNOSIS — R350 Frequency of micturition: Secondary | ICD-10-CM | POA: Diagnosis not present

## 2023-02-24 DIAGNOSIS — R3915 Urgency of urination: Secondary | ICD-10-CM | POA: Diagnosis not present

## 2023-02-25 ENCOUNTER — Telehealth: Payer: Self-pay | Admitting: *Deleted

## 2023-02-25 NOTE — Telephone Encounter (Signed)
   [facility] Pre-operative Risk Assessment    Patient Name: SERAFIN GEESLIN  DOB: Aug 11, 1946 MRN: [id number]  HEARTCARE STAFF:  - IMPORTANT!!!!!! Under Visit Info/Reason for Call, type in Other and utilize the format Clearance MM/DD/YY or Clearance TBD. Do not use dashes or single digits. - Please review there is not already an duplicate clearance open for this procedure. - If request is for dental extraction, please clarify the # of teeth to be extracted. - If the patient is currently at the dentist's office, call Pre-Op Callback Staff (MA/nurse) to input urgent request.  - If the patient is not currently in the dentist office, please route to the Pre-Op pool.  Request for surgical clearance:  What type of surgery is being performed? Rt total knee arthroplasty  When is this surgery scheduled? 06/13/23  What type of clearance is required (medical clearance vs. Pharmacy clearance to hold med vs. Both)? Both   Are there any medications that need to be held prior to surgery and how long? Asa 30  Practice name and name of physician performing surgery? [facility], Dr Lequita Halt  What is the office phone number? [phone]   7.   What is the office fax number? [phone] Attn:Kelly Hancock  8.   Anesthesia type (None, local, MAC, general) ? Choice   Valrie Hart 02/25/2023, 11:16 AM  _________________________________________________________________   (provider comments below)

## 2023-02-28 NOTE — Telephone Encounter (Signed)
     Primary Cardiologist: Nona Dell, MD  Chart reviewed as part of pre-operative protocol coverage. Given past medical history and time since last visit, based on ACC/AHA guidelines, Brandon [name] would be at acceptable risk for the planned procedure without further cardiovascular testing.   Underwent recent stress testing which showed low risk and no ischemia.  (10/05/2021).    His aspirin may be held for 7 days prior to his procedure.  Please resume as soon as hemostasis is achieved.  I will route this recommendation to the requesting party via Epic fax function and remove from pre-op pool.  Please call with questions.  Thomasene Ripple. [name] NP-C     02/28/2023, 11:21 AM [facility] [address] Office [phone] Fax [phone]

## 2023-03-03 DIAGNOSIS — R3915 Urgency of urination: Secondary | ICD-10-CM | POA: Diagnosis not present

## 2023-03-03 DIAGNOSIS — R350 Frequency of micturition: Secondary | ICD-10-CM | POA: Diagnosis not present

## 2023-03-10 DIAGNOSIS — N3941 Urge incontinence: Secondary | ICD-10-CM | POA: Diagnosis not present

## 2023-03-17 DIAGNOSIS — R350 Frequency of micturition: Secondary | ICD-10-CM | POA: Diagnosis not present

## 2023-03-17 DIAGNOSIS — R3915 Urgency of urination: Secondary | ICD-10-CM | POA: Diagnosis not present

## 2023-03-24 DIAGNOSIS — R3915 Urgency of urination: Secondary | ICD-10-CM | POA: Diagnosis not present

## 2023-03-24 DIAGNOSIS — R350 Frequency of micturition: Secondary | ICD-10-CM | POA: Diagnosis not present

## 2023-03-29 DIAGNOSIS — L84 Corns and callosities: Secondary | ICD-10-CM | POA: Diagnosis not present

## 2023-03-29 DIAGNOSIS — M79676 Pain in unspecified toe(s): Secondary | ICD-10-CM | POA: Diagnosis not present

## 2023-03-29 DIAGNOSIS — B351 Tinea unguium: Secondary | ICD-10-CM | POA: Diagnosis not present

## 2023-03-29 DIAGNOSIS — I70203 Unspecified atherosclerosis of native arteries of extremities, bilateral legs: Secondary | ICD-10-CM | POA: Diagnosis not present

## 2023-03-31 DIAGNOSIS — H903 Sensorineural hearing loss, bilateral: Secondary | ICD-10-CM | POA: Diagnosis not present

## 2023-04-05 DIAGNOSIS — E782 Mixed hyperlipidemia: Secondary | ICD-10-CM | POA: Diagnosis not present

## 2023-04-05 DIAGNOSIS — M1991 Primary osteoarthritis, unspecified site: Secondary | ICD-10-CM | POA: Diagnosis not present

## 2023-04-05 DIAGNOSIS — E119 Type 2 diabetes mellitus without complications: Secondary | ICD-10-CM | POA: Diagnosis not present

## 2023-04-05 DIAGNOSIS — Z0001 Encounter for general adult medical examination with abnormal findings: Secondary | ICD-10-CM | POA: Diagnosis not present

## 2023-04-05 DIAGNOSIS — E663 Overweight: Secondary | ICD-10-CM | POA: Diagnosis not present

## 2023-04-05 DIAGNOSIS — Z6827 Body mass index (BMI) 27.0-27.9, adult: Secondary | ICD-10-CM | POA: Diagnosis not present

## 2023-04-05 DIAGNOSIS — G7000 Myasthenia gravis without (acute) exacerbation: Secondary | ICD-10-CM | POA: Diagnosis not present

## 2023-04-05 DIAGNOSIS — I10 Essential (primary) hypertension: Secondary | ICD-10-CM | POA: Diagnosis not present

## 2023-04-05 DIAGNOSIS — I2510 Atherosclerotic heart disease of native coronary artery without angina pectoris: Secondary | ICD-10-CM | POA: Diagnosis not present

## 2023-04-05 DIAGNOSIS — Z01818 Encounter for other preprocedural examination: Secondary | ICD-10-CM | POA: Diagnosis not present

## 2023-04-06 DIAGNOSIS — R351 Nocturia: Secondary | ICD-10-CM | POA: Diagnosis not present

## 2023-04-06 DIAGNOSIS — N3941 Urge incontinence: Secondary | ICD-10-CM | POA: Diagnosis not present

## 2023-04-06 DIAGNOSIS — N200 Calculus of kidney: Secondary | ICD-10-CM | POA: Diagnosis not present

## 2023-04-06 DIAGNOSIS — R3915 Urgency of urination: Secondary | ICD-10-CM | POA: Diagnosis not present

## 2023-04-06 DIAGNOSIS — N401 Enlarged prostate with lower urinary tract symptoms: Secondary | ICD-10-CM | POA: Diagnosis not present

## 2023-04-15 ENCOUNTER — Other Ambulatory Visit: Payer: Self-pay | Admitting: Cardiology

## 2023-04-20 NOTE — Progress Notes (Signed)
COVID Vaccine Completed:  Yes  Date of COVID positive in last 90 days:  PCP - Assunta Found, MD Cardiologist - Nona Dell, MD  Cardiac clearance in Epic dated 02-28-23 by Edd Fabian, NP-C  Chest x-ray -  EKG - 01-26-23 Epic Stress Test - 10-05-21 Epic ECHO - 03-20-20 Epic Cardiac Cath -  Pacemaker/ICD device last checked: Spinal Cord Stimulator:  Bowel Prep -   Sleep Study -  CPAP -   Fasting Blood Sugar -  Checks Blood Sugar _____ times a day  Last dose of GLP1 agonist-  N/A GLP1 instructions:  N/A   Last dose of SGLT-2 inhibitors-  N/A SGLT-2 instructions: N/A   Blood Thinner Instructions:  Time Aspirin Instructions:  ASA 81.  Okay to hold x7 days Last Dose:  Activity level:  Can go up a flight of stairs and perform activities of daily living without stopping and without symptoms of chest pain or shortness of breath.  Able to exercise without symptoms  Unable to go up a flight of stairs without symptoms of     Anesthesia review:  CAD with hx of CABG, Afib, HTN, hypertensive heart disease, Myasthenia gravis  Patient denies shortness of breath, fever, cough and chest pain at PAT appointment  Patient verbalized understanding of instructions that were given to them at the PAT appointment. Patient was also instructed that they will need to review over the PAT instructions again at home before surgery.

## 2023-04-20 NOTE — H&P (Signed)
TOTAL KNEE ADMISSION H&P  Patient is being admitted for right total knee arthroplasty.  Subjective:  Chief Complaint: Right knee pain.  HPI: Brandon [name], 77 y.o. male has a history of pain and functional disability in the right knee due to arthritis and has failed non-surgical conservative treatments for greater than 12 weeks to include corticosteriod injections, viscosupplementation injections, use of assistive devices, and activity modification. Onset of symptoms was gradual, starting 6 years ago with gradually worsening course since that time. The patient noted no past surgery on the right knee.  Patient currently rates pain in the right knee at 8 out of 10 with activity. Patient has worsening of pain with activity and weight bearing and pain that interferes with activities of daily living. Patient has evidence of subchondral sclerosis, periarticular osteophytes, and joint space narrowing by imaging studies. There is no active infection.  Patient Active Problem List   Diagnosis Date Noted   S/P shoulder replacement, right 10/08/2022   Fracture, lumbar vertebra, compression (HCC) 03/27/2021   Atrial fibrillation with RVR (HCC)    Periprosthetic fracture around internal prosthetic left hip joint (HCC) 03/19/2020   OA (osteoarthritis) of hip 01/28/2020   Closed displaced fracture of left femoral neck (HCC) 01/28/2020   History of colonic polyps 11/12/2019   GERD (gastroesophageal reflux disease) 11/06/2019   Myasthenia gravis with (acute) exacerbation (HCC) 11/06/2019   Acute diarrhea 09/17/2019   Heme positive stool 09/17/2019   Unspecified osteoarthritis, unspecified site    Hyperlipidemia, unspecified    Personal history of other diseases of the musculoskeletal system and connective tissue    Atherosclerotic heart disease of native coronary artery without angina pectoris    Hypertensive heart disease without heart failure    Melena    Plantar fascial fibromatosis    Pleurodynia     Calculus of gallbladder without cholecystitis without obstruction 09/14/2018   Nausea without vomiting 06/05/2018   Postoperative atrial fibrillation (HCC) 11/09/2013   S/P CABG x 4 10/19/2013   Coronary atherosclerosis of native coronary artery 10/18/2013   Essential hypertension, benign 09/18/2013   Family history of hypertrophic cardiomyopathy 09/18/2013   Mixed hyperlipidemia     Past Medical History:  Diagnosis Date   Arthritis    Coronary atherosclerosis of native coronary artery    Multivessel status post CABG March 2015   DDD (degenerative disc disease), lumbar    Essential hypertension    GERD (gastroesophageal reflux disease)    Hard of hearing both ears    Left ear is worst   Hemorrhoids    History of gout    History of kidney stones    history of myasthenia Gravis 2021   History of [facility] spotted fever    Left ureteral  & renal stone    Lumbago    Mixed hyperlipidemia    Personal history of COVID-19 08/10/2021   Paxlovid   Plantar fascial fibromatosis    Postoperative atrial fibrillation (HCC)    Skin Cancer (HCC)    Use of cane as ambulatory aid    Uses roller walker    UTI (urinary tract infection) 09/16/2021   Wears glasses     Past Surgical History:  Procedure Laterality Date   AMPUTATION Right 03/27/2014   Procedure: DEBRIDEMENT AND CLOSURE RIGHT INDEX FINGER;  Surgeon: Sharma Covert, MD;  Location: [facility] OR;  Service: Orthopedics;  Laterality: Right;   BACK SURGERY     lower not sure when per wife on 10-01-2021   BACK SURGERY  BICEPT TENODESIS  11/26/2011   Procedure: BICEPT TENODESIS;  Surgeon: Verlee Rossetti, MD;  Location: [facility] OR;  Service: Orthopedics;  Laterality: Left;   BIOPSY  08/03/2018   Procedure: BIOPSY;  Surgeon: Malissa Hippo, MD;  Location: [facility] END[facility] SUITE;  Service: Endoscopy;;  antral   Cataract surgery Bilateral    yrs ago per wife on 10-01-2021   CHOLECYSTECTOMY N/A 09/22/2018   Procedure: LAPAROSCOPIC  CHOLECYSTECTOMY;  Surgeon: Lucretia Roers, MD;  Location: [facility] ORS;  Service: General;  Laterality: N/A;   COLONOSCOPY N/A 12/26/2014   Procedure: COLONOSCOPY;  Surgeon: Malissa Hippo, MD;  Location: [facility];  Service: Endoscopy;  Laterality: N/A;  730   CORONARY ARTERY BYPASS GRAFT N/A 10/19/2013   Procedure: CORONARY ARTERY BYPASS GRAFTING (CABG);  Surgeon: Purcell Nails, MD;  Location: [facility] OR;  Service: Open Heart Surgery;  Laterality: N/A;  CABG times four using left internal mammary artery and left leg saphenous vein, incision made on right leg but no vein removed   CYSTOSCOPY W/ URETERAL STENT PLACEMENT Right 02/22/2022   Procedure: CYSTOSCOPY WITH RETROGRADE PYELOGRAM/URETERAL STENT PLACEMENT;  Surgeon: Rene Paci, MD;  Location: [facility] ORS;  Service: Urology;  Laterality: Right;   CYSTOSCOPY WITH RETROGRADE PYELOGRAM, URETEROSCOPY AND STENT PLACEMENT Left 08/24/2016   Procedure: CYSTOSCOPY WITH LEFT RETROGRADE PYELOGRAM, LEFT URETEROSCOPY, BASKET EXTRACTION LEFT URETERAL STONE;  Surgeon: Bjorn Pippin, MD;  Location: [facility] ORS;  Service: Urology;  Laterality: Left;   CYSTOSCOPY/URETEROSCOPY/HOLMIUM LASER/STENT PLACEMENT Left 10/06/2021   Procedure: CYSTOSCOPY LEFT RETROGRADE PYELOGRAM URETEROSCOPY/HOLMIUM LASER/STENT PLACEMENT;  Surgeon: Bjorn Pippin, MD;  Location: [facility];  Service: Urology;  Laterality: Left;   CYSTOSCOPY/URETEROSCOPY/HOLMIUM LASER/STENT PLACEMENT Right 03/01/2022   Procedure: CYSTOSCOPY RIGHT URETEROSCOPY/HOLMIUM LASER/STENT PLACEMENT;  Surgeon: Bjorn Pippin, MD;  Location: [facility] ORS;  Service: Urology;  Laterality: Right;   ESOPHAGOGASTRODUODENOSCOPY N/A 08/03/2018   Procedure: ESOPHAGOGASTRODUODENOSCOPY (EGD);  Surgeon: Malissa Hippo, MD;  [facility]: [facility];  Service: Endoscopy;  Laterality: N/A;  2:55   EXTRACORPOREAL SHOCK WAVE LITHOTRIPSY Right 06/18/2019   Procedure: EXTRACORPOREAL SHOCK WAVE LITHOTRIPSY (ESWL);  Surgeon: Crista Elliot, MD;  Location: [facility] ORS;  Service: Urology;  Laterality: Right;   INTRAOPERATIVE TRANSESOPHAGEAL ECHOCARDIOGRAM N/A 10/19/2013   Procedure: INTRAOPERATIVE TRANSESOPHAGEAL ECHOCARDIOGRAM;  Surgeon: Purcell Nails, MD;  Location: [facility] OR;  Service: Open Heart Surgery;  Laterality: N/A;   KYPHOPLASTY N/A 03/27/2021   Procedure: Lumbar One and Lumbar Two Kyphoplasty;  Surgeon: Maeola Harman, MD;  Location: [facility] OR;  Service: Neurosurgery;  Laterality: N/A;   left hand carpal tunnel release  04/04/2015   LEFT HEART CATHETERIZATION WITH CORONARY ANGIOGRAM N/A 10/09/2013   Procedure: LEFT HEART CATHETERIZATION WITH CORONARY ANGIOGRAM;  Surgeon: Kathleene Hazel, MD;  Location: [facility] CATH LAB;  Service: Cardiovascular;  Laterality: N/A;   Left shoulder rotator cuff repair  12/04/2011   MOHS procedure  01/06/2021   dr Modena Jansky on left hand   ORIF PERIPROSTHETIC FRACTURE Left 03/19/2020   Procedure: OPEN REDUCTION INTERNAL FIXATION (ORIF) PERIPROSTHETIC FRACTURE LEFT FEMUR WITH FEMORAL REVISION;  Surgeon: Ollen Gross, MD;  Location: [facility] ORS;  Service: Orthopedics;  Laterality: Left;   REVERSE SHOULDER ARTHROPLASTY Right 10/08/2022   Procedure: REVERSE SHOULDER ARTHROPLASTY;  Surgeon: Beverely Low, MD;  Location: [facility] ORS;  Service: Orthopedics;  Laterality: Right;  general, choice with interscalene block   TOTAL HIP ARTHROPLASTY Left 01/28/2020   Procedure: TOTAL HIP ARTHROPLASTY ANTERIOR APPROACH;  Surgeon: Ollen Gross, MD;  Location:  [facility] ORS;  Service: Orthopedics;  Laterality: Left;    TOTAL KNEE ARTHROPLASTY Left 04/18/2001   traumaticvamputation of finger  2015   VASECTOMY     yrs ago per wife on 10-01-2021    Prior to Admission medications   Medication Sig Start Date End Date Taking? Authorizing Provider  allopurinol (ZYLOPRIM) 300 MG tablet Take 150 mg by mouth in the morning.    [provider]  amLODipine (NORVASC) 5 MG tablet Take 1 tablet (5 mg total) by  mouth daily. 11/11/19   Burnadette Pop, MD  Ascorbic Acid (VITAMIN C) 1000 MG tablet Take 1,000 mg by mouth in the morning.    [provider]  aspirin EC 81 MG tablet Take 1 tablet (81 mg total) by mouth daily. Swallow whole. 06/27/20   Jonelle Sidle, MD  Calcium Carb-Cholecalciferol (CALCIUM 600+D3 PO) Take 1 tablet by mouth in the morning and at bedtime.    [provider]  HYDROcodone-acetaminophen (NORCO/VICODIN) 5-325 MG tablet Take 1-2 tablets by mouth every 4 (four) hours as needed (for pain). 10/08/22   Beverely Low, MD  metoprolol tartrate (LOPRESSOR) 25 MG tablet Take 1/2 (one-half) tablet by mouth twice daily 04/15/23   Jonelle Sidle, MD  Misc Natural Products (OSTEO BI-FLEX ADV TRIPLE ST) TABS Take 1 tablet by mouth in the morning.    [provider]  Multiple Vitamin (MULTIVITAMIN) tablet Take 1 tablet by mouth daily.    [provider]  nitroGLYCERIN (NITROSTAT) 0.4 MG SL tablet Place 0.4 mg under the tongue every 5 (five) minutes as needed for chest pain.    [provider]  Omega-3 Fatty Acids (FISH OIL) 500 MG CAPS Take 500 mg by mouth in the morning.    [provider]  pantoprazole (PROTONIX) 40 MG tablet Take 40 mg by mouth daily. 09/10/22   [provider]  pravastatin (PRAVACHOL) 40 MG tablet Take 40 mg by mouth every evening.     [provider]  pyridostigmine (MESTINON) 60 MG tablet Take 0.5 tablets (30 mg total) by mouth 3 (three) times daily. 09/16/22 12/10/23  Penumalli, Glenford Bayley, MD  tamsulosin (FLOMAX) 0.4 MG CAPS capsule Take 1 capsule by mouth daily.    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 [employer]  Tobacco Use   Smoking status: Former    Current packs/day: 0.00    Average packs/day: 2.0 packs/day for 30.0 years (60.0 ttl pk-yrs)    Types: Cigarettes    Start date: 08/16/1958    Quit date: 08/17/1983    Years since quitting: 39.7   Smokeless tobacco: Former    Types: Chew    Quit date: 1985   Tobacco comments:    quit smoking 55yrs ago  Vaping Use   Vaping status: Never Used  Substance and Sexual Activity   Alcohol use: Not Currently    Comment: none in 4 years   Drug use: No   Sexual activity: Not on file  Other Topics Concern   Not on file  Social History Narrative   Lives with wife   Caffeine- coffee 3- 4 c daily  Social Determinants of Health   Financial Resource Strain: Low Risk  (04/21/2022)   Overall Financial Resource Strain (CARDIA)    Difficulty of Paying Living Expenses: Not hard at all  Food Insecurity: Low Risk  (03/31/2023)   Received from [facility]   Hunger Vital Sign    Worried About Running Out of Food in the Last Year: Never true    Ran Out of Food in the Last Year: Never true  Transportation Needs: Not on file (03/31/2023)  Physical Activity: Not on file  Stress: Not on file  Social Connections: Not on file  Intimate Partner Violence: Not At Risk (10/08/2022)   Humiliation, Afraid, Rape, and Kick questionnaire    Fear of Current or Ex-Partner: No    Emotionally Abused: No    Physically Abused: No    Sexually Abused: No    Tobacco Use: Medium Risk (03/31/2023)   Received from [facility]   Patient History    Smoking Tobacco Use: Former    Smokeless Tobacco Use: Never    Passive Exposure: Not on file   Social History   Substance and Sexual Activity  Alcohol Use Not Currently   Comment: none in 4 years    Family History  Problem Relation Age of Onset   Hypertension Sister    Lung cancer Father    Heart failure Mother     ROS  Objective:  Physical Exam: - Well-developed male, alert and oriented, no  apparent distress.  - Evaluation of his right knee shows a large effusion.  - Range of motion is about 5-110 with moderate crepitus on range of motion.  - Tenderness is greater on the lateral side than the medial side, with no instability noted.  - Gait pattern is antalgic with a cane.  - No pain on range of motion of his right hip.  IMAGING:  Radiographs from October 2023 were reviewed. They demonstrate advanced osteoarthritis in the right knee, with bone-on-bone changes in the lateral and patellofemoral compartments. The patient's left knee prosthesis is in a good position with no abnormalities noted.  Assessment/Plan:  End stage arthritis, right knee   The patient history, physical examination, clinical judgment of the provider and imaging studies are consistent with end stage degenerative joint disease of the right knee and total knee arthroplasty is deemed medically necessary. The treatment options including medical management, injection therapy arthroscopy and arthroplasty were discussed at length. The risks and benefits of total knee arthroplasty were presented and reviewed. The risks due to aseptic loosening, infection, stiffness, patella tracking problems, thromboembolic complications and other imponderables were discussed. The patient acknowledged the explanation, agreed to proceed with the plan and consent was signed. Patient is being admitted for inpatient treatment for surgery, pain control, PT, OT, prophylactic antibiotics, VTE prophylaxis, progressive ambulation and ADLs and discharge planning. The patient is planning to be discharged home with OPPT scheduled.   Patient's anticipated LOS is less than 2 midnights, meeting these requirements: - Younger than 69 - Lives within 1 hour of care - Has a competent adult at home to recover with post-op recover - NO history of  - Chronic pain requiring opiods  - Diabetes  - Coronary Artery Disease  - Heart failure  - Heart attack  -  Stroke  - DVT/VTE  - Cardiac arrhythmia  - Respiratory Failure/COPD  - Renal failure  - Anemia  - Advanced Liver disease  Therapy Plans: EO [place] Disposition: Home with wife Planned DVT Prophylaxis: Aspirin 81mg  BID  DME Needed: None PCP: Assunta Found, MD (clearance received) Cardiologist: Nona Dell (clearance received) TXA: IV Allergies: gabapentin (hallucinations) Anesthesia Concerns: None BMI: 26.6 Pharmacy: [facility] in [place]  Other: -OK to hold ASA for 7 days prior to surgery (per cardiology)  - Patient was instructed on what medications to stop prior to surgery. - Follow-up visit in 2 weeks with Dr. Lequita Halt - Begin physical therapy following surgery - Pre-operative lab work as pre-surgical testing - Prescriptions will be provided in hospital at time of discharge  Weston Brass, PA-C Orthopedic Surgery [facility] [place]

## 2023-04-20 NOTE — Patient Instructions (Addendum)
SURGICAL WAITING ROOM VISITATION Patients having surgery or a procedure may have no more than 2 support people in the waiting area - these visitors may rotate.    Children under the age of 29 must have an adult with them who is not the patient.  If the patient needs to stay at the hospital during part of their recovery, the visitor guidelines for inpatient rooms apply. Pre-op nurse will coordinate an appropriate time for 1 support person to accompany patient in pre-op.  This support person may not rotate.    Please refer to the [facility] website for the visitor guidelines for Inpatients (after your surgery is over and you are in a regular room).       Your procedure is scheduled on: 05-02-23   Report to [facility] Main Entrance    Report to admitting at 6:55 AM   Call this number if you have problems the morning of surgery [phone]   Do not eat food :After Midnight.   After Midnight you may have the following liquids until 6:25 AM DAY OF SURGERY  Water Non-Citrus Juices (without pulp, NO RED-Apple, White grape, White cranberry) Black Coffee (NO MILK/CREAM OR CREAMERS, sugar ok)  Clear Tea (NO MILK/CREAM OR CREAMERS, sugar ok) regular and decaf                             Plain Jell-O (NO RED)                                           Fruit ices (not with fruit pulp, NO RED)                                     Popsicles (NO RED)                                                               Sports drinks like Gatorade (NO RED)                   The day of surgery:  Drink ONE (1) Pre-Surgery Clear Ensure by 6:25 AM the morning of surgery. Drink in one sitting. Do not sip.  This drink was given to you during your hospital  pre-op appointment visit. Nothing else to drink after completing the Pre-Surgery Clear Ensure .          If you have questions, please contact your surgeon's office.   FOLLOW  ANY ADDITIONAL PRE OP INSTRUCTIONS YOU RECEIVED FROM YOUR SURGEON'S  OFFICE!!!     Oral Hygiene is also important to reduce your risk of infection.                                    Remember - BRUSH YOUR TEETH THE MORNING OF SURGERY WITH YOUR REGULAR TOOTHPASTE   Do NOT smoke after Midnight   Take these medicines the morning of surgery with A SIP OF WATER:   Allopurinol  Amlodipine  Metoprolol  Pantoprazole  Pyridostigmine  Tamsulosin  Hydrocodone if needed  Stop all vitamins and herbal supplements 7 days before surgery  Bring CPAP mask and tubing day of surgery.                              You may not have any metal on your body including jewelry, and body piercing             Do not wear  lotions, powders, cologne, or deodorant              Men may shave face and neck.   Do not bring valuables to the hospital. [facility] IS NOT RESPONSIBLE   FOR VALUABLES.   Contacts, dentures or bridgework may not be worn into surgery.   Bring small overnight bag day of surgery.   DO NOT BRING YOUR HOME MEDICATIONS TO THE HOSPITAL. PHARMACY WILL DISPENSE MEDICATIONS LISTED ON YOUR MEDICATION LIST TO YOU DURING YOUR ADMISSION IN THE HOSPITAL!    Special Instructions: Bring a copy of your healthcare power of attorney and living will documents the day of surgery if you haven't scanned them before.              Please read over the following fact sheets you were given: IF YOU HAVE QUESTIONS ABOUT YOUR PRE-OP INSTRUCTIONS PLEASE CALL [phone] Gwen  If you received a COVID test during your pre-op visit  it is requested that you wear a mask when out in public, stay away from anyone that may not be feeling well and notify your surgeon if you develop symptoms. If you test positive for Covid or have been in contact with anyone that has tested positive in the last 10 days please notify you surgeon.    Pre-operative 5 CHG Bath Instructions   You can play a key role in reducing the risk of infection after surgery. Your skin needs to be as free of germs as  possible. You can reduce the number of germs on your skin by washing with CHG (chlorhexidine gluconate) soap before surgery. CHG is an antiseptic soap that kills germs and continues to kill germs even after washing.   DO NOT use if you have an allergy to chlorhexidine/CHG or antibacterial soaps. If your skin becomes reddened or irritated, stop using the CHG and notify one of our RNs at [phone].   Please shower with the CHG soap starting 4 days before surgery using the following schedule:     Please keep in mind the following:  DO NOT shave, including legs and underarms, starting the day of your first shower.   You may shave your face at any point before/day of surgery.  Place clean sheets on your bed the day you start using CHG soap. Use a clean washcloth (not used since being washed) for each shower. DO NOT sleep with pets once you start using the CHG.   CHG Shower Instructions:  If you choose to wash your hair and private area, wash first with your normal shampoo/soap.  After you use shampoo/soap, rinse your hair and body thoroughly to remove shampoo/soap residue.  Turn the water OFF and apply about 3 tablespoons (45 ml) of CHG soap to a CLEAN washcloth.  Apply CHG soap ONLY FROM YOUR NECK DOWN TO YOUR TOES (washing for 3-5 minutes)  DO NOT use CHG soap on face, private areas, open wounds, or sores.  Pay special attention to  the area where your surgery is being performed.  If you are having back surgery, having someone wash your back for you may be helpful. Wait 2 minutes after CHG soap is applied, then you may rinse off the CHG soap.  Pat dry with a clean towel  Put on clean clothes/pajamas   If you choose to wear lotion, please use ONLY the CHG-compatible lotions on the back of this paper.     Additional instructions for the day of surgery: DO NOT APPLY any lotions, deodorants, cologne, or perfumes.   Put on clean/comfortable clothes.  Brush your teeth.  Ask your nurse before  applying any prescription medications to the skin.      CHG Compatible Lotions   Aveeno Moisturizing lotion  Cetaphil Moisturizing Cream  Cetaphil Moisturizing Lotion  Clairol Herbal Essence Moisturizing Lotion, Dry Skin  Clairol Herbal Essence Moisturizing Lotion, Extra Dry Skin  Clairol Herbal Essence Moisturizing Lotion, Normal Skin  Curel Age Defying Therapeutic Moisturizing Lotion with Alpha Hydroxy  Curel Extreme Care Body Lotion  Curel Soothing Hands Moisturizing Hand Lotion  Curel Therapeutic Moisturizing Cream, Fragrance-Free  Curel Therapeutic Moisturizing Lotion, Fragrance-Free  Curel Therapeutic Moisturizing Lotion, Original Formula  Eucerin Daily Replenishing Lotion  Eucerin Dry Skin Therapy Plus Alpha Hydroxy Crme  Eucerin Dry Skin Therapy Plus Alpha Hydroxy Lotion  Eucerin Original Crme  Eucerin Original Lotion  Eucerin Plus Crme Eucerin Plus Lotion  Eucerin TriLipid Replenishing Lotion  Keri Anti-Bacterial Hand Lotion  Keri Deep Conditioning Original Lotion Dry Skin Formula Softly Scented  Keri Deep Conditioning Original Lotion, Fragrance Free Sensitive Skin Formula  Keri Lotion Fast Absorbing Fragrance Free Sensitive Skin Formula  Keri Lotion Fast Absorbing Softly Scented Dry Skin Formula  Keri Original Lotion  Keri Skin Renewal Lotion Keri Silky Smooth Lotion  Keri Silky Smooth Sensitive Skin Lotion  Nivea Body Creamy Conditioning Oil  Nivea Body Extra Enriched Lotion  Nivea Body Original Lotion  Nivea Body Sheer Moisturizing Lotion Nivea Crme  Nivea Skin Firming Lotion  NutraDerm 30 Skin Lotion  NutraDerm Skin Lotion  NutraDerm Therapeutic Skin Cream  NutraDerm Therapeutic Skin Lotion  ProShield Protective Hand Cream  Provon moisturizing lotion   PATIENT SIGNATURE_________________________________  NURSE SIGNATURE__________________________________  ________________________________________________________________________    Brandon [name]  An incentive spirometer is a tool that can help keep your lungs clear and active. This tool measures how well you are filling your lungs with each breath. Taking long deep breaths may help reverse or decrease the chance of developing breathing (pulmonary) problems (especially infection) following: A long period of time when you are unable to move or be active. BEFORE THE PROCEDURE  If the spirometer includes an indicator to show your best effort, your nurse or respiratory therapist will set it to a desired goal. If possible, sit up straight or lean slightly forward. Try not to slouch. Hold the incentive spirometer in an upright position. INSTRUCTIONS FOR USE  Sit on the edge of your bed if possible, or sit up as far as you can in bed or on a chair. Hold the incentive spirometer in an upright position. Breathe out normally. Place the mouthpiece in your mouth and seal your lips tightly around it. Breathe in slowly and as deeply as possible, raising the piston or the ball toward the top of the column. Hold your breath for 3-5 seconds or for as long as possible. Allow the piston or ball to fall to the bottom of the column. Remove the mouthpiece from your mouth and  breathe out normally. Rest for a few seconds and repeat Steps 1 through 7 at least 10 times every 1-2 hours when you are awake. Take your time and take a few normal breaths between deep breaths. The spirometer may include an indicator to show your best effort. Use the indicator as a goal to work toward during each repetition. After each set of 10 deep breaths, practice coughing to be sure your lungs are clear. If you have an incision (the cut made at the time of surgery), support your incision when coughing by placing a pillow or rolled up towels firmly against it. Once you are able to get out of bed, walk around indoors and cough well. You may stop using the incentive spirometer when instructed by your caregiver.  RISKS AND  COMPLICATIONS Take your time so you do not get dizzy or light-headed. If you are in pain, you may need to take or ask for pain medication before doing incentive spirometry. It is harder to take a deep breath if you are having pain. AFTER USE Rest and breathe slowly and easily. It can be helpful to keep track of a log of your progress. Your caregiver can provide you with a simple table to help with this. If you are using the spirometer at home, follow these instructions: SEEK MEDICAL CARE IF:  You are having difficultly using the spirometer. You have trouble using the spirometer as often as instructed. Your pain medication is not giving enough relief while using the spirometer. You develop fever of 100.5 F (38.1 C) or higher. SEEK IMMEDIATE MEDICAL CARE IF:  You cough up bloody sputum that had not been present before. You develop fever of 102 F (38.9 C) or greater. You develop worsening pain at or near the incision site. MAKE SURE YOU:  Understand these instructions. Will watch your condition. Will get help right away if you are not doing well or get worse. Document Released: 12/13/2006 Document Revised: 10/25/2011 Document Reviewed: 02/13/2007 [facility] Patient Information 2014 [place], [place].   ________________________________________________________________________

## 2023-04-21 ENCOUNTER — Other Ambulatory Visit: Payer: Self-pay

## 2023-04-21 ENCOUNTER — Encounter (HOSPITAL_COMMUNITY)
Admission: RE | Admit: 2023-04-21 | Discharge: 2023-04-21 | Disposition: A | Discharge status: Home / Self Care | Payer: Medicare Other | Source: Ambulatory Visit | Attending: Orthopedic Surgery | Admitting: Orthopedic Surgery

## 2023-04-21 ENCOUNTER — Encounter (HOSPITAL_COMMUNITY): Payer: Self-pay

## 2023-04-21 VITALS — BP 128/80 | HR 51 | Temp 98.3°F | Resp 16 | Ht 73.0 in | Wt 190.6 lb

## 2023-04-21 DIAGNOSIS — Z96653 Presence of artificial knee joint, bilateral: Secondary | ICD-10-CM | POA: Diagnosis not present

## 2023-04-21 DIAGNOSIS — I2510 Atherosclerotic heart disease of native coronary artery without angina pectoris: Secondary | ICD-10-CM | POA: Insufficient documentation

## 2023-04-21 DIAGNOSIS — Z87891 Personal history of nicotine dependence: Secondary | ICD-10-CM | POA: Insufficient documentation

## 2023-04-21 DIAGNOSIS — I10 Essential (primary) hypertension: Secondary | ICD-10-CM | POA: Insufficient documentation

## 2023-04-21 DIAGNOSIS — Z01818 Encounter for other preprocedural examination: Secondary | ICD-10-CM

## 2023-04-21 DIAGNOSIS — Z01812 Encounter for preprocedural laboratory examination: Secondary | ICD-10-CM | POA: Diagnosis not present

## 2023-04-21 DIAGNOSIS — Z951 Presence of aortocoronary bypass graft: Secondary | ICD-10-CM | POA: Diagnosis not present

## 2023-04-21 DIAGNOSIS — G7000 Myasthenia gravis without (acute) exacerbation: Secondary | ICD-10-CM | POA: Diagnosis not present

## 2023-04-21 LAB — CBC
HCT: 44.2 % (ref 39.0–52.0)
Hemoglobin: 14.3 g/dL (ref 13.0–17.0)
MCH: 29.9 pg (ref 26.0–34.0)
MCHC: 32.4 g/dL (ref 30.0–36.0)
MCV: 92.5 fL (ref 80.0–100.0)
Platelets: 211 10*3/uL (ref 150–400)
RBC: 4.78 MIL/uL (ref 4.22–5.81)
RDW: 14.3 % (ref 11.5–15.5)
WBC: 7.4 10*3/uL (ref 4.0–10.5)
nRBC: 0 % (ref 0.0–0.2)

## 2023-04-21 LAB — BASIC METABOLIC PANEL
Anion gap: 9 (ref 5–15)
BUN: 14 mg/dL (ref 8–23)
CO2: 24 mmol/L (ref 22–32)
Calcium: 8.9 mg/dL (ref 8.9–10.3)
Chloride: 103 mmol/L (ref 98–111)
Creatinine, Ser: 0.99 mg/dL (ref 0.61–1.24)
GFR, Estimated: >60 mL/min (ref >60)
Glucose, Bld: 103 mg/dL — ABNORMAL HIGH (ref 70–99)
Potassium: 3.9 mmol/L (ref 3.5–5.1)
Sodium: 136 mmol/L (ref 135–145)

## 2023-04-21 LAB — SURGICAL PCR SCREEN
MRSA, PCR: POSITIVE — AB
Staphylococcus aureus: POSITIVE — AB

## 2023-04-21 NOTE — Progress Notes (Signed)
PCR results sent to Dr. Aluisio to review. 

## 2023-04-22 NOTE — Progress Notes (Signed)
Choose an anesthesia record to view details        DISCUSSION: Brandon [name] is a 77 yo male who presents to PAT prior to R TKA on 05/02/23 with Dr. Lequita Halt. PMH significant for former smoking, HTN, CAD s/p CABG (2015), hx of myasthenia gravis, arthritis  Patient follows with Cardiology for CAD s/p CABG. Last seen on 01/26/23. Per Dr. Diona Browner: "Patient pending elective right total hip arthroplasty presumably under general anesthesia in October with Dr. Lequita Halt.  RCRI perioperative cardiac risk index is class II, 0.9% chance of major adverse cardiac event.  He already tolerated right shoulder surgery earlier in the year.  No further cardiac testing indicated at this time in the absence of new symptoms.  He should be able to proceed as planned unless there is a substantial change in cardiac status in the interim.  He may hold aspirin if requested preoperatively."  Patient follows with Neurology for myasthenia gravis. Last seen on 09/16/22. Per Dr. Marjory Lies: "Since last visit, doing well. Symptoms are stable. Severity  Tolerating meds.  No MG sxs" Advised f/u in 1 year.  VS: BP 128/80   Pulse (!) 51   Temp 36.8 C (Oral)   Resp 16   Ht 6\' 1"  (1.854 m)   Wt 86.5 kg   SpO2 98%   BMI 25.15 kg/m   PROVIDERS: PCP - Assunta Found, MD Cardiologist - Nona Dell, MD   LABS: Labs reviewed: Acceptable for surgery. (all labs ordered are listed, but only abnormal results are displayed)  Labs Reviewed  SURGICAL PCR SCREEN - Abnormal; Notable for the following components:      Result Value   MRSA, PCR POSITIVE (*)    Staphylococcus aureus POSITIVE (*)    All other components within normal limits  BASIC METABOLIC PANEL - Abnormal; Notable for the following components:   Glucose, Bld 103 (*)    All other components within normal limits  CBC     IMAGES:   EKG 01/26/23  Sinus bradycardia, rate 52   CV:  Stress test 10/05/2021:   Findings are consistent with small prior  inferior/inferoseptal myocardial infarction with very mild peri-infarct ischemia. The study is low risk.   No ST deviation was noted.   There is a small mild intensity inferior/inferoseptal defect with very mild reversibility.   Left ventricular function is normal. Nuclear stress EF: 57 %. The left ventricular ejection fraction is normal (55-65%). End diastolic cavity size is normal.  Echo 03/20/2020: IMPRESSIONS     1. Left ventricular ejection fraction, by estimation, is 50 to 55%. The  left ventricle has low normal function. The left ventricle has no regional  wall motion abnormalities. There is mild left ventricular hypertrophy of  the basal-septal segment. Left  ventricular diastolic function could not be evaluated.   2. Right ventricular systolic function is normal. The right ventricular  size is normal. Tricuspid regurgitation signal is inadequate for assessing  PA pressure.   3. Left atrial size was severely dilated.   4. The mitral valve is normal in structure. Trivial mitral valve  regurgitation. No evidence of mitral stenosis.   5. The aortic valve is tricuspid. Aortic valve regurgitation is not  visualized. Mild aortic valve sclerosis is present, with no evidence of  aortic valve stenosis.   6. The inferior vena cava is normal in size with greater than 50%  respiratory variability, suggesting right atrial pressure of 3 mmHg.   Past Medical History:  Diagnosis Date   Arthritis  Coronary atherosclerosis of native coronary artery    Multivessel status post CABG March 2015   DDD (degenerative disc disease), lumbar    Essential hypertension    GERD (gastroesophageal reflux disease)    Hard of hearing both ears    Left ear is worst   Hemorrhoids    History of gout    History of kidney stones    history of myasthenia Gravis 2021   History of [facility] spotted fever    Left ureteral  & renal stone    Lumbago    Mixed hyperlipidemia    Personal history of COVID-19  08/10/2021   Paxlovid   Plantar fascial fibromatosis    Postoperative atrial fibrillation (HCC)    Skin Cancer (HCC)    Use of cane as ambulatory aid    Uses roller walker    UTI (urinary tract infection) 09/16/2021   Wears glasses     Past Surgical History:  Procedure Laterality Date   AMPUTATION Right 03/27/2014   Procedure: DEBRIDEMENT AND CLOSURE RIGHT INDEX FINGER;  Surgeon: Sharma Covert, MD;  Location: [facility] OR;  Service: Orthopedics;  Laterality: Right;   BACK SURGERY     lower not sure when per wife on 10-01-2021   BACK SURGERY     BICEPT TENODESIS  11/26/2011   Procedure: BICEPT TENODESIS;  Surgeon: Verlee Rossetti, MD;  Location: [facility] OR;  Service: Orthopedics;  Laterality: Left;   BIOPSY  08/03/2018   Procedure: BIOPSY;  Surgeon: Malissa Hippo, MD;  Location: [facility] ENDO SUITE;  Service: Endoscopy;;  antral   Cataract surgery Bilateral    yrs ago per wife on 10-01-2021   CHOLECYSTECTOMY N/A 09/22/2018   Procedure: LAPAROSCOPIC CHOLECYSTECTOMY;  Surgeon: Lucretia Roers, MD;  Location: [facility] ORS;  Service: General;  Laterality: N/A;   COLONOSCOPY N/A 12/26/2014   Procedure: COLONOSCOPY;  Surgeon: Malissa Hippo, MD;  Location: [facility] ENDO SUITE;  Service: Endoscopy;  Laterality: N/A;  730   CORONARY ARTERY BYPASS GRAFT N/A 10/19/2013   Procedure: CORONARY ARTERY BYPASS GRAFTING (CABG);  Surgeon: Purcell Nails, MD;  Location[facility] OR;  Service: Open Heart Surgery;  Laterality: N/A;  CABG times four using left internal mammary artery and left leg saphenous vein, incision made on right leg but no vein removed   CYSTOSCOPY W/ URETERAL STENT PLACEMENT Right 02/22/2022   Procedure: CYSTOSCOPY WITH RETROGRADE PYELOGRAM/URETERAL STENT PLACEMENT;  Surgeon: Rene Paci, MD;  Location: [facility] ORS;  Service: Urology;  Laterality: Right;   CYSTOSCOPY WITH RETROGRADE PYELOGRAM, URETEROSCOPY AND STENT PLACEMENT Left 08/24/2016   Procedure: CYSTOSCOPY WITH LEFT RETROGRADE PYELOGRAM, LEFT  URETEROSCOPY, BASKET EXTRACTION LEFT URETERAL STONE;  Surgeon: Bjorn Pippin, MD;  Location: [facility] ORS;  Service: Urology;  Laterality: Left;   CYSTOSCOPY/URETEROSCOPY/HOLMIUM LASER/STENT PLACEMENT Left 10/06/2021   Procedure: CYSTOSCOPY LEFT RETROGRADE PYELOGRAM URETEROSCOPY/HOLMIUM LASER/STENT PLACEMENT;  Surgeon: Bjorn Pippin, MD;  Location: [facility];  Service: Urology;  Laterality: Left;   CYSTOSCOPY/URETEROSCOPY/HOLMIUM LASER/STENT PLACEMENT Right 03/01/2022   Procedure: CYSTOSCOPY RIGHT URETEROSCOPY/HOLMIUM LASER/STENT PLACEMENT;  Surgeon: Bjorn Pippin, MD;  Location: [facility] ORS;  Service: Urology;  Laterality: Right;   ESOPHAGOGASTRODUODENOSCOPY N/A 08/03/2018   Procedure: ESOPHAGOGASTRODUODENOSCOPY (EGD);  Surgeon: Malissa Hippo, MD;  [facility];  Service: Endoscopy;  Laterality: N/A;  2:55   EXTRACORPOREAL SHOCK WAVE LITHOTRIPSY Right 06/18/2019   Procedure: EXTRACORPOREAL SHOCK WAVE LITHOTRIPSY (ESWL);  Surgeon: Crista Elliot, MD;  Location: [facility] ORS;  Service: Urology;  Laterality: Right;  INTRAOPERATIVE TRANSESOPHAGEAL ECHOCARDIOGRAM N/A 10/19/2013   Procedure: INTRAOPERATIVE TRANSESOPHAGEAL ECHOCARDIOGRAM;  Surgeon: Purcell Nails, MD;  Location: [facility] OR;  Service: Open Heart Surgery;  Laterality: N/A;   KYPHOPLASTY N/A 03/27/2021   Procedure: Lumbar One and Lumbar Two Kyphoplasty;  Surgeon: Maeola Harman, MD;  Location: [facility] OR;  Service: Neurosurgery;  Laterality: N/A;   left hand carpal tunnel release  04/04/2015   LEFT HEART CATHETERIZATION WITH CORONARY ANGIOGRAM N/A 10/09/2013   Procedure: LEFT HEART CATHETERIZATION WITH CORONARY ANGIOGRAM;  Surgeon: Kathleene Hazel, MD;  Location: [facility] CATH LAB;  Service: Cardiovascular;  Laterality: N/A;   Left shoulder rotator cuff repair  12/04/2011   MOHS procedure  01/06/2021   dr Modena Jansky on left hand   ORIF PERIPROSTHETIC FRACTURE Left 03/19/2020   Procedure: OPEN REDUCTION INTERNAL FIXATION (ORIF)  PERIPROSTHETIC FRACTURE LEFT FEMUR WITH FEMORAL REVISION;  Surgeon: Ollen Gross, MD;  Location: [facility] ORS;  Service: Orthopedics;  Laterality: Left;   REVERSE SHOULDER ARTHROPLASTY Right 10/08/2022   Procedure: REVERSE SHOULDER ARTHROPLASTY;  Surgeon: Beverely Low, MD;  Location: [facility] ORS;  Service: Orthopedics;  Laterality: Right;  general, choice with interscalene block   TOTAL HIP ARTHROPLASTY Left 01/28/2020   Procedure: TOTAL HIP ARTHROPLASTY ANTERIOR APPROACH;  Surgeon: Ollen Gross, MD;  Location: [facility] ORS;  Service: Orthopedics;  Laterality: Left;    TOTAL KNEE ARTHROPLASTY Left 04/18/2001   traumaticvamputation of finger  2015   VASECTOMY     yrs ago per wife on 10-01-2021    MEDICATIONS:  allopurinol (ZYLOPRIM) 300 MG tablet   amLODipine (NORVASC) 5 MG tablet   Ascorbic Acid (VITAMIN C) 1000 MG tablet   aspirin EC 81 MG tablet   Calcium Carb-Cholecalciferol (CALCIUM 600+D3 PO)   clotrimazole-betamethasone (LOTRISONE) cream   HYDROcodone-acetaminophen (NORCO/VICODIN) 5-325 MG tablet   metoprolol tartrate (LOPRESSOR) 25 MG tablet   Misc Natural Products (OSTEO BI-FLEX ADV TRIPLE ST) TABS   Multiple Vitamin (MULTIVITAMIN) tablet   nitroGLYCERIN (NITROSTAT) 0.4 MG SL tablet   Omega-3 Fatty Acids (FISH OIL) 500 MG CAPS   pantoprazole (PROTONIX) 40 MG tablet   pravastatin (PRAVACHOL) 40 MG tablet   pyridostigmine (MESTINON) 60 MG tablet   tamsulosin (FLOMAX) 0.4 MG CAPS capsule   No current facility-administered medications for this encounter.   Marcille Blanco [facility] Surgical Short Stay/Anesthesiology [facility] Phone [phone] 04/22/2023 1:34 PM

## 2023-04-22 NOTE — Anesthesia Preprocedure Evaluation (Addendum)
Anesthesia Evaluation  Patient identified by MRN, date of birth, ID band Patient awake    Reviewed: Allergy & Precautions, H&P , NPO status , Patient's Chart, lab work & pertinent test results  Airway Mallampati: II  TM Distance: >3 FB Neck ROM: Full    Dental no notable dental hx.    Pulmonary neg pulmonary ROS, former smoker   Pulmonary exam normal breath sounds clear to auscultation       Cardiovascular hypertension, + CAD  Normal cardiovascular exam Rhythm:Regular Rate:Normal     Neuro/Psych  Neuromuscular disease  negative psych ROS   GI/Hepatic Neg liver ROS,GERD  ,,  Endo/Other  negative endocrine ROS    Renal/GU negative Renal ROS  negative genitourinary   Musculoskeletal  (+) Arthritis ,    Abdominal   Peds negative pediatric ROS (+)  Hematology negative hematology ROS (+)   Anesthesia Other Findings   Reproductive/Obstetrics negative OB ROS                              Anesthesia Physical Anesthesia Plan  ASA: 3  Anesthesia Plan: Spinal   Post-op Pain Management:    Induction:   PONV Risk Score and Plan: Treatment may vary due to age or medical condition  Airway Management Planned: Natural Airway  Additional Equipment:   Intra-op Plan:   Post-operative Plan:   Informed Consent: I have reviewed the patients History and Physical, chart, labs and discussed the procedure including the risks, benefits and alternatives for the proposed anesthesia with the patient or authorized representative who has indicated his/her understanding and acceptance.     Dental advisory given  Plan Discussed with: CRNA  Anesthesia Plan Comments: (See PAT note from 9/5 by K Gekas PA-C  Brandon [name] is a 77 yo male who presents to PAT prior to R TKA on 05/02/23 with Dr. Lequita Halt. PMH significant for former smoking, HTN, CAD s/p CABG (2015), hx of myasthenia gravis, arthritis)          Anesthesia Quick Evaluation

## 2023-05-02 ENCOUNTER — Encounter (HOSPITAL_COMMUNITY)
Admission: RE | Disposition: A | Discharge status: Home / Self Care | Payer: Self-pay | Source: Home / Self Care | Attending: Orthopedic Surgery

## 2023-05-02 ENCOUNTER — Other Ambulatory Visit: Payer: Self-pay

## 2023-05-02 ENCOUNTER — Ambulatory Visit (HOSPITAL_COMMUNITY): Payer: Medicare Other | Admitting: Medical

## 2023-05-02 ENCOUNTER — Ambulatory Visit (HOSPITAL_COMMUNITY): Payer: Self-pay

## 2023-05-02 ENCOUNTER — Encounter (HOSPITAL_COMMUNITY): Payer: Self-pay | Admitting: Orthopedic Surgery

## 2023-05-02 ENCOUNTER — Observation Stay (HOSPITAL_COMMUNITY)
Admission: RE | Admit: 2023-05-02 | Discharge: 2023-05-03 | Disposition: A | Discharge status: Home / Self Care | Payer: Medicare Other | Attending: Orthopedic Surgery | Admitting: Orthopedic Surgery

## 2023-05-02 DIAGNOSIS — Z85828 Personal history of other malignant neoplasm of skin: Secondary | ICD-10-CM | POA: Insufficient documentation

## 2023-05-02 DIAGNOSIS — G8918 Other acute postprocedural pain: Secondary | ICD-10-CM

## 2023-05-02 DIAGNOSIS — Z951 Presence of aortocoronary bypass graft: Secondary | ICD-10-CM | POA: Diagnosis not present

## 2023-05-02 DIAGNOSIS — Z96652 Presence of left artificial knee joint: Secondary | ICD-10-CM | POA: Diagnosis not present

## 2023-05-02 DIAGNOSIS — I2510 Atherosclerotic heart disease of native coronary artery without angina pectoris: Secondary | ICD-10-CM

## 2023-05-02 DIAGNOSIS — M1711 Unilateral primary osteoarthritis, right knee: Principal | ICD-10-CM | POA: Diagnosis present

## 2023-05-02 DIAGNOSIS — I119 Hypertensive heart disease without heart failure: Secondary | ICD-10-CM | POA: Diagnosis not present

## 2023-05-02 DIAGNOSIS — Z96611 Presence of right artificial shoulder joint: Secondary | ICD-10-CM | POA: Diagnosis not present

## 2023-05-02 DIAGNOSIS — Z87891 Personal history of nicotine dependence: Secondary | ICD-10-CM

## 2023-05-02 DIAGNOSIS — I4891 Unspecified atrial fibrillation: Secondary | ICD-10-CM | POA: Diagnosis not present

## 2023-05-02 DIAGNOSIS — Z79899 Other long term (current) drug therapy: Secondary | ICD-10-CM | POA: Insufficient documentation

## 2023-05-02 DIAGNOSIS — M179 Osteoarthritis of knee, unspecified: Principal | ICD-10-CM | POA: Diagnosis present

## 2023-05-02 DIAGNOSIS — Z8616 Personal history of COVID-19: Secondary | ICD-10-CM | POA: Diagnosis not present

## 2023-05-02 DIAGNOSIS — Z7982 Long term (current) use of aspirin: Secondary | ICD-10-CM | POA: Insufficient documentation

## 2023-05-02 DIAGNOSIS — Z96642 Presence of left artificial hip joint: Secondary | ICD-10-CM | POA: Insufficient documentation

## 2023-05-02 DIAGNOSIS — I10 Essential (primary) hypertension: Secondary | ICD-10-CM | POA: Diagnosis not present

## 2023-05-02 HISTORY — PX: TOTAL KNEE ARTHROPLASTY: SHX125

## 2023-05-02 SURGERY — ARTHROPLASTY, KNEE, TOTAL
Anesthesia: Spinal | Site: Knee | Laterality: Right

## 2023-05-02 MED ORDER — PROPOFOL 500 MG/50ML IV EMUL
INTRAVENOUS | Status: DC | PRN
  Administered 2023-05-02: 85 ug/kg/min via INTRAVENOUS

## 2023-05-02 MED ORDER — POLYETHYLENE GLYCOL 3350 17 G PO PACK: 17.0000 g | PACK | Freq: Every day | ORAL | Status: DC | PRN

## 2023-05-02 MED ORDER — DOCUSATE SODIUM 100 MG PO CAPS
100.0000 mg | ORAL_CAPSULE | Freq: Two times a day (BID) | ORAL | Status: DC
  Administered 2023-05-02 – 2023-05-03 (×3): 100 mg via ORAL
  Filled 2023-05-02 (×3): qty 1

## 2023-05-02 MED ORDER — METHOCARBAMOL 500 MG PO TABS
500.0000 mg | ORAL_TABLET | Freq: Four times a day (QID) | ORAL | Status: DC | PRN
  Administered 2023-05-02 (×2): 500 mg via ORAL
  Filled 2023-05-02 (×2): qty 1

## 2023-05-02 MED ORDER — FLEET ENEMA RE ENEM: 1.0000 | ENEMA | Freq: Once | RECTAL | Status: DC | PRN

## 2023-05-02 MED ORDER — MIDAZOLAM HCL 2 MG/2ML IJ SOLN: 1.0000 mg | INTRAMUSCULAR | Status: DC

## 2023-05-02 MED ORDER — MUPIROCIN 2 % EX OINT: 1.0000 | TOPICAL_OINTMENT | Freq: Two times a day (BID) | CUTANEOUS | 0 refills | Status: AC

## 2023-05-02 MED ORDER — OXYCODONE HCL 5 MG/5ML PO SOLN: 5.0000 mg | Freq: Once | ORAL | Status: DC | PRN

## 2023-05-02 MED ORDER — PHENYLEPHRINE HCL-NACL 20-0.9 MG/250ML-% IV SOLN
INTRAVENOUS | Status: AC
  Filled 2023-05-02: qty 250

## 2023-05-02 MED ORDER — ALLOPURINOL 300 MG PO TABS
150.0000 mg | ORAL_TABLET | Freq: Every morning | ORAL | Status: DC
  Administered 2023-05-03: 150 mg via ORAL
  Filled 2023-05-02: qty 1

## 2023-05-02 MED ORDER — CEFAZOLIN SODIUM-DEXTROSE 2-4 GM/100ML-% IV SOLN
2.0000 g | INTRAVENOUS | Status: AC
  Administered 2023-05-02: 2 g via INTRAVENOUS
  Filled 2023-05-02: qty 100

## 2023-05-02 MED ORDER — POVIDONE-IODINE 10 % EX SWAB: 2.0000 | Freq: Once | CUTANEOUS | Status: DC

## 2023-05-02 MED ORDER — LACTATED RINGERS IV SOLN: INTRAVENOUS | Status: DC

## 2023-05-02 MED ORDER — NITROGLYCERIN 0.4 MG SL SUBL: 0.4000 mg | SUBLINGUAL_TABLET | SUBLINGUAL | Status: DC | PRN

## 2023-05-02 MED ORDER — PANTOPRAZOLE SODIUM 40 MG PO TBEC
40.0000 mg | DELAYED_RELEASE_TABLET | Freq: Every day | ORAL | Status: DC
  Administered 2023-05-03: 40 mg via ORAL
  Filled 2023-05-02: qty 1

## 2023-05-02 MED ORDER — GLYCOPYRROLATE 0.2 MG/ML IJ SOLN
INTRAMUSCULAR | Status: DC | PRN
  Administered 2023-05-02: .2 mg via INTRAVENOUS

## 2023-05-02 MED ORDER — GLYCOPYRROLATE 0.2 MG/ML IJ SOLN
INTRAMUSCULAR | Status: AC
  Filled 2023-05-02: qty 1

## 2023-05-02 MED ORDER — FENTANYL CITRATE PF 50 MCG/ML IJ SOSY: 25.0000 ug | PREFILLED_SYRINGE | INTRAMUSCULAR | Status: DC | PRN

## 2023-05-02 MED ORDER — STERILE WATER FOR IRRIGATION IR SOLN
Status: DC | PRN
  Administered 2023-05-02: 2000 mL

## 2023-05-02 MED ORDER — ACETAMINOPHEN 10 MG/ML IV SOLN: 1000.0000 mg | Freq: Once | INTRAVENOUS | Status: DC | PRN

## 2023-05-02 MED ORDER — SODIUM CHLORIDE 0.9 % IR SOLN
Status: DC | PRN
  Administered 2023-05-02: 1000 mL

## 2023-05-02 MED ORDER — SODIUM CHLORIDE (PF) 0.9 % IJ SOLN
INTRAMUSCULAR | Status: DC | PRN
  Administered 2023-05-02: 80 mL

## 2023-05-02 MED ORDER — ONDANSETRON HCL 4 MG/2ML IJ SOLN
INTRAMUSCULAR | Status: AC
  Filled 2023-05-02: qty 2

## 2023-05-02 MED ORDER — ONDANSETRON HCL 4 MG PO TABS: 4.0000 mg | ORAL_TABLET | Freq: Four times a day (QID) | ORAL | Status: DC | PRN

## 2023-05-02 MED ORDER — SODIUM CHLORIDE (PF) 0.9 % IJ SOLN
INTRAMUSCULAR | Status: AC
  Filled 2023-05-02: qty 50

## 2023-05-02 MED ORDER — PYRIDOSTIGMINE BROMIDE 60 MG PO TABS
30.0000 mg | ORAL_TABLET | Freq: Two times a day (BID) | ORAL | Status: DC
  Administered 2023-05-02 – 2023-05-03 (×2): 30 mg via ORAL
  Filled 2023-05-02 (×2): qty 0.5

## 2023-05-02 MED ORDER — TRAMADOL HCL 50 MG PO TABS
50.0000 mg | ORAL_TABLET | Freq: Four times a day (QID) | ORAL | Status: DC | PRN
  Administered 2023-05-02: 50 mg via ORAL
  Filled 2023-05-02: qty 1

## 2023-05-02 MED ORDER — MORPHINE SULFATE (PF) 2 MG/ML IV SOLN
1.0000 mg | INTRAVENOUS | Status: DC | PRN
  Administered 2023-05-02: 1 mg via INTRAVENOUS
  Filled 2023-05-02: qty 1

## 2023-05-02 MED ORDER — BISACODYL 10 MG RE SUPP: 10.0000 mg | Freq: Every day | RECTAL | Status: DC | PRN

## 2023-05-02 MED ORDER — OXYCODONE HCL 5 MG PO TABS
5.0000 mg | ORAL_TABLET | ORAL | Status: DC | PRN
  Administered 2023-05-02: 10 mg via ORAL
  Administered 2023-05-02 (×2): 5 mg via ORAL
  Administered 2023-05-03: 10 mg via ORAL
  Administered 2023-05-03: 5 mg via ORAL
  Filled 2023-05-02 (×2): qty 1
  Filled 2023-05-02: qty 2
  Filled 2023-05-02: qty 1
  Filled 2023-05-02: qty 2

## 2023-05-02 MED ORDER — DIPHENHYDRAMINE HCL 12.5 MG/5ML PO ELIX: 12.5000 mg | ORAL_SOLUTION | ORAL | Status: DC | PRN

## 2023-05-02 MED ORDER — PHENOL 1.4 % MT LIQD: 1.0000 | OROMUCOSAL | Status: DC | PRN

## 2023-05-02 MED ORDER — CHLORHEXIDINE GLUCONATE 0.12 % MT SOLN
15.0000 mL | Freq: Once | OROMUCOSAL | Status: AC
  Administered 2023-05-02: 15 mL via OROMUCOSAL

## 2023-05-02 MED ORDER — ACETAMINOPHEN 500 MG PO TABS
1000.0000 mg | ORAL_TABLET | Freq: Four times a day (QID) | ORAL | Status: AC
  Administered 2023-05-02 – 2023-05-03 (×4): 1000 mg via ORAL
  Filled 2023-05-02 (×4): qty 2

## 2023-05-02 MED ORDER — ROPIVACAINE HCL 5 MG/ML IJ SOLN
INTRAMUSCULAR | Status: DC | PRN
Start: 2023-05-02 — End: 2023-05-02
  Administered 2023-05-02: 20 mL via EPIDURAL

## 2023-05-02 MED ORDER — CEFAZOLIN SODIUM-DEXTROSE 2-4 GM/100ML-% IV SOLN
2.0000 g | Freq: Four times a day (QID) | INTRAVENOUS | Status: AC
  Administered 2023-05-02 (×2): 2 g via INTRAVENOUS
  Filled 2023-05-02 (×2): qty 100

## 2023-05-02 MED ORDER — MENTHOL 3 MG MT LOZG: 1.0000 | LOZENGE | OROMUCOSAL | Status: DC | PRN

## 2023-05-02 MED ORDER — SODIUM CHLORIDE (PF) 0.9 % IJ SOLN
INTRAMUSCULAR | Status: AC
  Filled 2023-05-02: qty 10

## 2023-05-02 MED ORDER — DROPERIDOL 2.5 MG/ML IJ SOLN: 0.6250 mg | Freq: Once | INTRAMUSCULAR | Status: DC | PRN

## 2023-05-02 MED ORDER — DEXAMETHASONE SODIUM PHOSPHATE 10 MG/ML IJ SOLN
INTRAMUSCULAR | Status: AC
  Filled 2023-05-02: qty 1

## 2023-05-02 MED ORDER — METOPROLOL TARTRATE 12.5 MG HALF TABLET
12.5000 mg | ORAL_TABLET | Freq: Two times a day (BID) | ORAL | Status: DC
  Administered 2023-05-02 – 2023-05-03 (×2): 12.5 mg via ORAL
  Filled 2023-05-02 (×2): qty 1

## 2023-05-02 MED ORDER — TRANEXAMIC ACID-NACL 1000-0.7 MG/100ML-% IV SOLN
1000.0000 mg | INTRAVENOUS | Status: AC
  Administered 2023-05-02: 1000 mg via INTRAVENOUS
  Filled 2023-05-02: qty 100

## 2023-05-02 MED ORDER — PHENYLEPHRINE HCL-NACL 20-0.9 MG/250ML-% IV SOLN
INTRAVENOUS | Status: DC | PRN
  Administered 2023-05-02: 25 ug/min via INTRAVENOUS

## 2023-05-02 MED ORDER — BUPIVACAINE LIPOSOME 1.3 % IJ SUSP: 20.0000 mL | Freq: Once | INTRAMUSCULAR | Status: DC

## 2023-05-02 MED ORDER — OXYCODONE HCL 5 MG PO TABS: 5.0000 mg | ORAL_TABLET | Freq: Once | ORAL | Status: DC | PRN

## 2023-05-02 MED ORDER — 0.9 % SODIUM CHLORIDE (POUR BTL) OPTIME
TOPICAL | Status: DC | PRN
  Administered 2023-05-02: 1000 mL

## 2023-05-02 MED ORDER — BUPIVACAINE HCL (PF) 0.75 % IJ SOLN
INTRAMUSCULAR | Status: DC | PRN
Start: 2023-05-02 — End: 2023-05-02
  Administered 2023-05-02: 1.6 mL

## 2023-05-02 MED ORDER — FENTANYL CITRATE PF 50 MCG/ML IJ SOSY
50.0000 ug | PREFILLED_SYRINGE | INTRAMUSCULAR | Status: DC
  Administered 2023-05-02: 50 ug via INTRAVENOUS
  Filled 2023-05-02: qty 2

## 2023-05-02 MED ORDER — CHLORHEXIDINE GLUCONATE 4 % EX SOLN: 1.0000 | CUTANEOUS | 1 refills | Status: DC

## 2023-05-02 MED ORDER — AMLODIPINE BESYLATE 5 MG PO TABS
5.0000 mg | ORAL_TABLET | Freq: Every day | ORAL | Status: DC
  Administered 2023-05-03: 5 mg via ORAL
  Filled 2023-05-02: qty 1

## 2023-05-02 MED ORDER — PROPOFOL 1000 MG/100ML IV EMUL
INTRAVENOUS | Status: AC
  Filled 2023-05-02: qty 100

## 2023-05-02 MED ORDER — TAMSULOSIN HCL 0.4 MG PO CAPS
0.4000 mg | ORAL_CAPSULE | Freq: Every day | ORAL | Status: DC
  Administered 2023-05-03: 0.4 mg via ORAL
  Filled 2023-05-02: qty 1

## 2023-05-02 MED ORDER — BUPIVACAINE LIPOSOME 1.3 % IJ SUSP
INTRAMUSCULAR | Status: AC
  Filled 2023-05-02: qty 20

## 2023-05-02 MED ORDER — METHOCARBAMOL 1000 MG/10ML IJ SOLN: 500.0000 mg | Freq: Four times a day (QID) | INTRAVENOUS | Status: DC | PRN

## 2023-05-02 MED ORDER — METOCLOPRAMIDE HCL 5 MG/ML IJ SOLN: 5.0000 mg | Freq: Three times a day (TID) | INTRAMUSCULAR | Status: DC | PRN

## 2023-05-02 MED ORDER — ONDANSETRON HCL 4 MG/2ML IJ SOLN
INTRAMUSCULAR | Status: DC | PRN
  Administered 2023-05-02: 4 mg via INTRAVENOUS

## 2023-05-02 MED ORDER — ASPIRIN 81 MG PO CHEW
81.0000 mg | CHEWABLE_TABLET | Freq: Two times a day (BID) | ORAL | Status: DC
  Administered 2023-05-03: 81 mg via ORAL
  Filled 2023-05-02: qty 1

## 2023-05-02 MED ORDER — PRAVASTATIN SODIUM 20 MG PO TABS: 40.0000 mg | ORAL_TABLET | Freq: Every evening | ORAL | Status: DC

## 2023-05-02 MED ORDER — ONDANSETRON HCL 4 MG/2ML IJ SOLN
4.0000 mg | Freq: Four times a day (QID) | INTRAMUSCULAR | Status: DC | PRN
  Administered 2023-05-02 – 2023-05-03 (×2): 4 mg via INTRAVENOUS
  Filled 2023-05-02 (×2): qty 2

## 2023-05-02 MED ORDER — SODIUM CHLORIDE 0.9 % IV SOLN: INTRAVENOUS | Status: DC

## 2023-05-02 MED ORDER — VANCOMYCIN HCL IN DEXTROSE 1-5 GM/200ML-% IV SOLN
1000.0000 mg | INTRAVENOUS | Status: AC
  Administered 2023-05-02: 1000 mg via INTRAVENOUS
  Filled 2023-05-02: qty 200

## 2023-05-02 MED ORDER — METOCLOPRAMIDE HCL 5 MG PO TABS: 5.0000 mg | ORAL_TABLET | Freq: Three times a day (TID) | ORAL | Status: DC | PRN

## 2023-05-02 MED ORDER — DEXAMETHASONE SODIUM PHOSPHATE 10 MG/ML IJ SOLN
10.0000 mg | Freq: Once | INTRAMUSCULAR | Status: AC
  Administered 2023-05-03: 10 mg via INTRAVENOUS
  Filled 2023-05-02: qty 1

## 2023-05-02 MED ORDER — ORAL CARE MOUTH RINSE: 15.0000 mL | Freq: Once | OROMUCOSAL | Status: AC

## 2023-05-02 MED ORDER — ACETAMINOPHEN 325 MG PO TABS: 325.0000 mg | ORAL_TABLET | Freq: Four times a day (QID) | ORAL | Status: DC | PRN

## 2023-05-02 SURGICAL SUPPLY — 55 items
ADH SKN CLS APL DERMABOND .7 (GAUZE/BANDAGES/DRESSINGS) ×1
ATTUNE MED DOME PAT 41 KNEE (Knees) IMPLANT
ATTUNE PS FEM RT SZ 7 CEM KNEE (Femur) IMPLANT
ATTUNE PSRP INSR SZ7 8 KNEE (Insert) IMPLANT
BAG COUNTER SPONGE SURGICOUNT (BAG) IMPLANT
BAG SPEC THK2 15X12 ZIP CLS (MISCELLANEOUS) ×1
BAG SPNG CNTER NS LX DISP (BAG)
BAG ZIPLOCK 12X15 (MISCELLANEOUS) ×1 IMPLANT
BASE TIBIAL ROT PLAT SZ 8 KNEE (Knees) IMPLANT
BLADE SAG 18X100X1.27 (BLADE) ×1 IMPLANT
BLADE SAW SGTL 11.0X1.19X90.0M (BLADE) ×1 IMPLANT
BNDG CMPR 6 X 5 YARDS HK CLSR (GAUZE/BANDAGES/DRESSINGS) ×1
BNDG ELASTIC 6INX 5YD STR LF (GAUZE/BANDAGES/DRESSINGS) ×1 IMPLANT
BOWL SMART MIX CTS (DISPOSABLE) ×1 IMPLANT
BSPLAT TIB 8 CMNT ROT PLAT STR (Knees) ×1 IMPLANT
CEMENT HV SMART SET (Cement) ×2 IMPLANT
COVER SURGICAL LIGHT HANDLE (MISCELLANEOUS) ×1 IMPLANT
CUFF TOURN SGL QUICK 34 (TOURNIQUET CUFF) ×1
CUFF TRNQT CYL 34X4.125X (TOURNIQUET CUFF) ×1 IMPLANT
DERMABOND ADVANCED .7 DNX12 (GAUZE/BANDAGES/DRESSINGS) ×1 IMPLANT
DRAPE U-SHAPE 47X51 STRL (DRAPES) ×1 IMPLANT
DRSG AQUACEL AG ADV 3.5X10 (GAUZE/BANDAGES/DRESSINGS) ×1 IMPLANT
DURAPREP 26ML APPLICATOR (WOUND CARE) ×1 IMPLANT
ELECT REM PT RETURN 15FT ADLT (MISCELLANEOUS) ×1 IMPLANT
GLOVE BIO SURGEON STRL SZ 6.5 (GLOVE) IMPLANT
GLOVE BIO SURGEON STRL SZ8 (GLOVE) ×1 IMPLANT
GLOVE BIOGEL PI IND STRL 6.5 (GLOVE) IMPLANT
GLOVE BIOGEL PI IND STRL 7.0 (GLOVE) IMPLANT
GLOVE BIOGEL PI IND STRL 8 (GLOVE) ×1 IMPLANT
GOWN STRL REUS W/ TWL LRG LVL3 (GOWN DISPOSABLE) ×1 IMPLANT
GOWN STRL REUS W/TWL LRG LVL3 (GOWN DISPOSABLE) ×1
HANDPIECE INTERPULSE COAX TIP (DISPOSABLE) ×1
HOLDER FOLEY CATH W/STRAP (MISCELLANEOUS) IMPLANT
IMMOBILIZER KNEE 20 (SOFTGOODS) ×1
IMMOBILIZER KNEE 20 THIGH 36 (SOFTGOODS) ×1 IMPLANT
KIT TURNOVER KIT A (KITS) IMPLANT
MANIFOLD NEPTUNE II (INSTRUMENTS) ×1 IMPLANT
NS IRRIG 1000ML POUR BTL (IV SOLUTION) ×1 IMPLANT
PACK TOTAL KNEE CUSTOM (KITS) ×1 IMPLANT
PADDING CAST ABS COTTON 6X4 NS (CAST SUPPLIES) IMPLANT
PADDING CAST COTTON 6X4 STRL (CAST SUPPLIES) ×2 IMPLANT
PIN STEINMAN FIXATION KNEE (PIN) IMPLANT
PROTECTOR NERVE ULNAR (MISCELLANEOUS) ×1 IMPLANT
SET HNDPC FAN SPRY TIP SCT (DISPOSABLE) ×1 IMPLANT
SPIKE FLUID TRANSFER (MISCELLANEOUS) ×1 IMPLANT
SUT MNCRL AB 4-0 PS2 18 (SUTURE) ×1 IMPLANT
SUT STRATAFIX 0 PDS 27 VIOLET (SUTURE) ×1
SUT VIC AB 2-0 CT1 27 (SUTURE) ×3
SUT VIC AB 2-0 CT1 TAPERPNT 27 (SUTURE) ×3 IMPLANT
SUTURE STRATFX 0 PDS 27 VIOLET (SUTURE) ×1 IMPLANT
TIBIAL BASE ROT PLAT SZ 8 KNEE (Knees) ×1 IMPLANT
TRAY FOLEY MTR SLVR 16FR STAT (SET/KITS/TRAYS/PACK) ×1 IMPLANT
TUBE SUCTION HIGH CAP CLEAR NV (SUCTIONS) ×1 IMPLANT
WATER STERILE IRR 1000ML POUR (IV SOLUTION) ×2 IMPLANT
WRAP KNEE MAXI GEL POST OP (GAUZE/BANDAGES/DRESSINGS) ×1 IMPLANT

## 2023-05-02 NOTE — Discharge Instructions (Addendum)
Ollen Gross, MD Total Joint Specialist [facility] [address], [address] [place], [place] [address] [phone]  TOTAL KNEE REPLACEMENT POSTOPERATIVE DIRECTIONS    Knee Rehabilitation, Guidelines Following Surgery  Results after knee surgery are often greatly improved when you follow the exercise, range of motion and muscle strengthening exercises prescribed by your doctor. Safety measures are also important to protect the knee from further injury. If any of these exercises cause you to have increased pain or swelling in your knee joint, decrease the amount until you are comfortable again and slowly increase them. If you have problems or questions, call your caregiver or physical therapist for advice.   BLOOD CLOT PREVENTION Take an 81 mg Aspirin two times a day for three weeks following surgery. Then resume an 81 mg Aspirin once a day You may resume your vitamins/supplements upon discharge from the hospital. Do not take any NSAIDs (Advil, Aleve, Ibuprofen, Meloxicam, etc.) until you have discontinued the 81 mg Aspirin twice a day.  HOME CARE INSTRUCTIONS  Remove items at home which could result in a fall. This includes throw rugs or furniture in walking pathways.  ICE to the affected knee as much as tolerated. Icing helps control swelling. If the swelling is well controlled you will be more comfortable and rehab easier. Continue to use ice on the knee for pain and swelling from surgery. You may notice swelling that will progress down to the foot and ankle. This is normal after surgery. Elevate the leg when you are not up walking on it.    Continue to use the breathing machine which will help keep your temperature down. It is common for your temperature to cycle up and down following surgery, especially at night when you are not up moving around and exerting yourself. The breathing machine keeps your lungs expanded and your temperature down. Do not place pillow under  the operative knee, focus on keeping the knee straight while resting  DIET You may resume your previous home diet once you are discharged from the hospital.  DRESSING / WOUND CARE / SHOWERING Keep your bulky bandage on for 2 days. On the third post-operative day you may remove the Ace bandage and gauze. There is a waterproof adhesive bandage on your skin which will stay in place until your first follow-up appointment. Once you remove this you will not need to place another bandage You may begin showering 3 days following surgery, but do not submerge the incision under water.  ACTIVITY For the first 5 days, the key is rest and control of pain and swelling Do your home exercises twice a day starting on post-operative day 3. On the days you go to physical therapy, just do the home exercises once that day. You should rest, ice and elevate the leg for 50 minutes out of every hour. Get up and walk/stretch for 10 minutes per hour. After 5 days you can increase your activity slowly as tolerated. Walk with your walker as instructed. Use the walker until you are comfortable transitioning to a cane. Walk with the cane in the opposite hand of the operative leg. You may discontinue the cane once you are comfortable and walking steadily. Avoid periods of inactivity such as sitting longer than an hour when not asleep. This helps prevent blood clots.  You may discontinue the knee immobilizer once you are able to perform a straight leg raise while lying down. You may resume a sexual relationship in one month or when given  the OK by your doctor.  You may return to work once you are cleared by your doctor.  Do not drive a car for 6 weeks or until released by your surgeon.  Do not drive while taking narcotics.  TED HOSE STOCKINGS Wear the elastic stockings on both legs for three weeks following surgery during the day. You may remove them at night for sleeping.  WEIGHT BEARING Weight bearing as tolerated with  assist device (walker, cane, etc) as directed, use it as long as suggested by your surgeon or therapist, typically at least 4-6 weeks.  POSTOPERATIVE CONSTIPATION PROTOCOL Constipation - defined medically as fewer than three stools per week and severe constipation as less than one stool per week.  One of the most common issues patients have following surgery is constipation.  Even if you have a regular bowel pattern at home, your normal regimen is likely to be disrupted due to multiple reasons following surgery.  Combination of anesthesia, postoperative narcotics, change in appetite and fluid intake all can affect your bowels.  In order to avoid complications following surgery, here are some recommendations in order to help you during your recovery period.  Colace (docusate) - Pick up an over-the-counter form of Colace or another stool softener and take twice a day as long as you are requiring postoperative pain medications.  Take with a full glass of water daily.  If you experience loose stools or diarrhea, hold the colace until you stool forms back up. If your symptoms do not get better within 1 week or if they get worse, check with your doctor. Dulcolax (bisacodyl) - Pick up over-the-counter and take as directed by the product packaging as needed to assist with the movement of your bowels.  Take with a full glass of water.  Use this product as needed if not relieved by Colace only.  MiraLax (polyethylene glycol) - Pick up over-the-counter to have on hand. MiraLax is a solution that will increase the amount of water in your bowels to assist with bowel movements.  Take as directed and can mix with a glass of water, juice, soda, coffee, or tea. Take if you go more than two days without a movement. Do not use MiraLax more than once per day. Call your doctor if you are still constipated or irregular after using this medication for 7 days in a row.  If you continue to have problems with postoperative  constipation, please contact the office for further assistance and recommendations.  If you experience "the worst abdominal pain ever" or develop nausea or vomiting, please contact the office immediatly for further recommendations for treatment.  ITCHING If you experience itching with your medications, try taking only a single pain pill, or even half a pain pill at a time.  You can also use Benadryl over the counter for itching or also to help with sleep.   MEDICATIONS See your medication summary on the "After Visit Summary" that the nursing staff will review with you prior to discharge.  You may have some home medications which will be placed on hold until you complete the course of blood thinner medication.  It is important for you to complete the blood thinner medication as prescribed by your surgeon.  Continue your approved medications as instructed at time of discharge.  PRECAUTIONS If you experience chest pain or shortness of breath - call 911 immediately for transfer to the hospital emergency department.  If you develop a fever greater that 101 F, purulent drainage from  wound, increased redness or drainage from wound, foul odor from the wound/dressing, or calf pain - CONTACT YOUR SURGEON.                                                   FOLLOW-UP APPOINTMENTS Make sure you keep all of your appointments after your operation with your surgeon and caregivers. You should call the office at the above phone number and make an appointment for approximately two weeks after the date of your surgery or on the date instructed by your surgeon outlined in the "After Visit Summary".  RANGE OF MOTION AND STRENGTHENING EXERCISES  Rehabilitation of the knee is important following a knee injury or an operation. After just a few days of immobilization, the muscles of the thigh which control the knee become weakened and shrink (atrophy). Knee exercises are designed to build up the tone and strength of the thigh  muscles and to improve knee motion. Often times heat used for twenty to thirty minutes before working out will loosen up your tissues and help with improving the range of motion but do not use heat for the first two weeks following surgery. These exercises can be done on a training (exercise) mat, on the floor, on a table or on a bed. Use what ever works the best and is most comfortable for you Knee exercises include:  Leg Lifts - While your knee is still immobilized in a splint or cast, you can do straight leg raises. Lift the leg to 60 degrees, hold for 3 sec, and slowly lower the leg. Repeat 10-20 times 2-3 times daily. Perform this exercise against resistance later as your knee gets better.  Quad and Hamstring Sets - Tighten up the muscle on the front of the thigh (Quad) and hold for 5-10 sec. Repeat this 10-20 times hourly. Hamstring sets are done by pushing the foot backward against an object and holding for 5-10 sec. Repeat as with quad sets.  Leg Slides: Lying on your back, slowly slide your foot toward your buttocks, bending your knee up off the floor (only go as far as is comfortable). Then slowly slide your foot back down until your leg is flat on the floor again. Angel Wings: Lying on your back spread your legs to the side as far apart as you can without causing discomfort.  A rehabilitation program following serious knee injuries can speed recovery and prevent re-injury in the future due to weakened muscles. Contact your doctor or a physical therapist for more information on knee rehabilitation.   POST-OPERATIVE OPIOID TAPER INSTRUCTIONS: It is important to wean off of your opioid medication as soon as possible. If you do not need pain medication after your surgery it is ok to stop day one. Opioids include: Codeine, Hydrocodone(Norco, Vicodin), Oxycodone(Percocet, oxycontin) and hydromorphone amongst others.  Long term and even short term use of opiods can cause: Increased pain  response Dependence Constipation Depression Respiratory depression And more.  Withdrawal symptoms can include Flu like symptoms Nausea, vomiting And more Techniques to manage these symptoms Hydrate well Eat regular healthy meals Stay active Use relaxation techniques(deep breathing, meditating, yoga) Do Not substitute Alcohol to help with tapering If you have been on opioids for less than two weeks and do not have pain than it is ok to stop all together.  Plan to wean off  of opioids This plan should start within one week post op of your joint replacement. Maintain the same interval or time between taking each dose and first decrease the dose.  Cut the total daily intake of opioids by one tablet each day Next start to increase the time between doses. The last dose that should be eliminated is the evening dose.   IF YOU ARE TRANSFERRED TO A SKILLED REHAB FACILITY If the patient is transferred to a skilled rehab facility following release from the hospital, a list of the current medications will be sent to the facility for the patient to continue.  When discharged from the skilled rehab facility, please have the facility set up the patient's Home Health Physical Therapy prior to being released. Also, the skilled facility will be responsible for providing the patient with their medications at time of release from the facility to include their pain medication, the muscle relaxants, and their blood thinner medication. If the patient is still at the rehab facility at time of the two week follow up appointment, the skilled rehab facility will also need to assist the patient in arranging follow up appointment in our office and any transportation needs.  MAKE SURE YOU:  Understand these instructions.  Get help right away if you are not doing well or get worse.   DENTAL ANTIBIOTICS:  In most cases prophylactic antibiotics for Dental procdeures after total joint surgery are not  necessary.  Exceptions are as follows:  1. History of prior total joint infection  2. Severely immunocompromised (Organ Transplant, cancer chemotherapy, Rheumatoid biologic medications such as Humera)  3. Poorly controlled diabetes (A1C &gt; 8.0, blood glucose over 200)  If you have one of these conditions, contact your surgeon for an antibiotic prescription, prior to your dental procedure.    Pick up stool softner and laxative for home use following surgery while on pain medications. Do not submerge incision under water. Please use good hand washing techniques while changing dressing each day. May shower starting three days after surgery. Please use a clean towel to pat the incision dry following showers. Continue to use ice for pain and swelling after surgery. Do not use any lotions or creams on the incision until instructed by your surgeon.

## 2023-05-02 NOTE — Op Note (Signed)
OPERATIVE REPORT-TOTAL KNEE ARTHROPLASTY   Pre-operative diagnosis- Osteoarthritis  Right knee(s)  Post-operative diagnosis- Osteoarthritis Right knee(s)  Procedure-  Right  Total Knee Arthroplasty  Surgeon- Gus Rankin. Kordae Buonocore, MD  Assistant- Weston Brass, PA-C   Anesthesia-   Adductor canal block and spinal  EBL-50 mL   Drains None  Tourniquet time-  Total Tourniquet Time Documented: Thigh (Right) - 34 minutes Total: Thigh (Right) - 34 minutes     Complications- None  Condition-PACU - hemodynamically stable.   Brief Clinical Note  Brandon [name] is a 77 y.o. year old male with end stage OA of his right knee with progressively worsening pain and dysfunction. He has constant pain, with activity and at rest and significant functional deficits with difficulties even with ADLs. He has had extensive non-op management including analgesics, injections of cortisone and viscosupplements, and home exercise program, but remains in significant pain with significant dysfunction. Radiographs show bone on bone arthritis lateral and patellofemoral. He presents now for right Total Knee Arthroplasty.     Procedure in detail---   The patient is brought into the operating room and positioned supine on the operating table. After successful administration of  Adductor canal block and spinal,   a tourniquet is placed high on the  Right thigh(s) and the lower extremity is prepped and draped in the usual sterile fashion. Time out is performed by the operating team and then the  Right lower extremity is wrapped in Esmarch, knee flexed and the tourniquet inflated to 300 mmHg.       A midline incision is made with a ten blade through the subcutaneous tissue to the level of the extensor mechanism. A fresh blade is used to make a medial parapatellar arthrotomy. Soft tissue over the proximal medial tibia is subperiosteally elevated to the joint line with a knife and into the semimembranosus bursa with a  Cobb elevator. Soft tissue over the proximal lateral tibia is elevated with attention being paid to avoiding the patellar tendon on the tibial tubercle. The patella is everted, knee flexed 90 degrees and the ACL and PCL are removed. Findings are bone on bone lateral and patellofemoral with large global osteophytes        The drill is used to create a starting hole in the distal femur and the canal is thoroughly irrigated with sterile saline to remove the fatty contents. The 5 degree Right  valgus alignment guide is placed into the femoral canal and the distal femoral cutting block is pinned to remove 9 mm off the distal femur. Resection is made with an oscillating saw.      The tibia is subluxed forward and the menisci are removed. The extramedullary alignment guide is placed referencing proximally at the medial aspect of the tibial tubercle and distally along the second metatarsal axis and tibial crest. The block is pinned to remove 2mm off the more deficient lateral  side. Resection is made with an oscillating saw. Size 8is the most appropriate size for the tibia and the proximal tibia is prepared with the modular drill and keel punch for that size.      The femoral sizing guide is placed and size 7 is most appropriate. Rotation is marked off the epicondylar axis and confirmed by creating a rectangular flexion gap at 90 degrees. The size 7 cutting block is pinned in this rotation and the anterior, posterior and chamfer cuts are made with the oscillating saw. The intercondylar block is then placed and that cut is  made.      Trial size 8 tibial component, trial size 7 posterior stabilized femur and a 8  mm posterior stabilized rotating platform insert trial is placed. Full extension is achieved with excellent varus/valgus and anterior/posterior balance throughout full range of motion. The patella is everted and thickness measured to be 27  mm. Free hand resection is taken to 15 mm, a 41 template is placed, lug  holes are drilled, trial patella is placed, and it tracks normally. Osteophytes are removed off the posterior femur with the trial in place. All trials are removed and the cut bone surfaces prepared with pulsatile lavage. Cement is mixed and once ready for implantation, the size 8 tibial implant, size  7 posterior stabilized femoral component, and the size 41 patella are cemented in place and the patella is held with the clamp. The trial insert is placed and the knee held in full extension. The Exparel (20 ml mixed with 60 ml saline) is injected into the extensor mechanism, posterior capsule, medial and lateral gutters and subcutaneous tissues.  All extruded cement is removed and once the cement is hard the permanent 8 mm posterior stabilized rotating platform insert is placed into the tibial tray.      The wound is copiously irrigated with saline solution and the extensor mechanism closed with # 0 Stratofix suture. The tourniquet is released for a total tourniquet time of 34  minutes. Flexion against gravity is 140 degrees and the patella tracks normally. Subcutaneous tissue is closed with 2.0 vicryl and subcuticular with running 4.0 Monocryl. The incision is cleaned and dried and steri-strips and a bulky sterile dressing are applied. The limb is placed into a knee immobilizer and the patient is awakened and transported to recovery in stable condition.      Please note that a surgical assistant was a medical necessity for this procedure in order to perform it in a safe and expeditious manner. Surgical assistant was necessary to retract the ligaments and vital neurovascular structures to prevent injury to them and also necessary for proper positioning of the limb to allow for anatomic placement of the prosthesis.   Gus Rankin Zack Crager, MD    05/02/2023, 10:38 AM

## 2023-05-02 NOTE — Progress Notes (Signed)
Orthopedic Tech Progress Note Patient Details:  Brandon [name] 1945/10/31 [id number]  CPM Left Knee CPM Left Knee: Off CPM Right Knee CPM Right Knee: Off Right Knee Flexion (Degrees): 40 Right Knee Extension (Degrees): 10 CPM removed by staff 14:55  Post Interventions Patient Tolerated: Well Instructions Provided: Adjustment of device  Tonye Pearson 05/02/2023, 3:10 PM

## 2023-05-02 NOTE — Plan of Care (Signed)
  Problem: Education: Goal: Knowledge of the prescribed therapeutic regimen will improve Outcome: Progressing   Problem: Activity: Goal: Ability to tolerate increased activity will improve Outcome: Progressing   Problem: Pain Management: Goal: Pain level will decrease with appropriate interventions Outcome: Progressing   Problem: Education: Goal: Knowledge of the prescribed therapeutic regimen will improve Outcome: Progressing   Problem: Activity: Goal: Ability to avoid complications of mobility impairment will improve Outcome: Progressing   Problem: Pain Management: Goal: Pain level will decrease with appropriate interventions Outcome: Progressing   Problem: Education: Goal: Knowledge of General Education information will improve Description: Including pain rating scale, medication(s)/side effects and non-pharmacologic comfort measures Outcome: Progressing   Problem: Activity: Goal: Risk for activity intolerance will decrease Outcome: Progressing   Problem: Nutrition: Goal: Adequate nutrition will be maintained Outcome: Progressing   Problem: Coping: Goal: Level of anxiety will decrease Outcome: Progressing   Problem: Elimination: Goal: Will not experience complications related to bowel motility Outcome: Progressing   Problem: Pain Managment: Goal: General experience of comfort will improve Outcome: Progressing   Problem: Skin Integrity: Goal: Risk for impaired skin integrity will decrease Outcome: Progressing

## 2023-05-02 NOTE — Progress Notes (Signed)
Orthopedic Tech Progress Note Patient Details:  Brandon [name] 04-22-1946 [id number]  CPM Right Knee CPM Right Knee: On Right Knee Flexion (Degrees): 10 Right Knee Extension (Degrees): 40  Post Interventions Patient Tolerated: Well Instructions Provided: Adjustment of device  Tonye Pearson 05/02/2023, 11:20 AM

## 2023-05-02 NOTE — Interval H&P Note (Signed)
History and Physical Interval Note:  05/02/2023 6:29 AM  Brandon [name]  has presented today for surgery, with the diagnosis of right knee osteoarthritis.  The various methods of treatment have been discussed with the patient and family. After consideration of risks, benefits and other options for treatment, the patient has consented to  Procedure(s): TOTAL KNEE ARTHROPLASTY (Right) as a surgical intervention.  The patient's history has been reviewed, patient examined, no change in status, stable for surgery.  I have reviewed the patient's chart and labs.  Questions were answered to the patient's satisfaction.     Homero Fellers Cheryel Kyte

## 2023-05-02 NOTE — Anesthesia Procedure Notes (Signed)
Anesthesia Regional Block: Adductor canal block   Pre-Anesthetic Checklist: , timeout performed,  Correct Patient, Correct Site, Correct Laterality,  Correct Procedure, Correct Position, site marked,  Risks and benefits discussed,  Surgical consent,  Pre-op evaluation,  At surgeon's request and post-op pain management  Laterality: Right  Prep: chloraprep       Needles:  Injection technique: Single-shot  Needle Type: Echogenic Stimulator Needle     Needle Length: 9cm  Needle Gauge: 21     Additional Needles:   Procedures:,,,, ultrasound used (permanent image in chart),,    Narrative:  Start time: 05/02/2023 7:56 AM End time: 05/02/2023 7:58 AM Injection made incrementally with aspirations every 5 mL.  Performed by: Personally  Anesthesiologist: [place] Nation, MD  Additional Notes: Discussed risks and benefits of the nerve block in detail, including but not limited vascular injury, permanent nerve damage and infection.   Patient tolerated the procedure well. Local anesthetic introduced in an incremental fashion under minimal resistance after negative aspirations. No paresthesias were elicited. After completion of the procedure, no acute issues were identified and patient continued to be monitored by RN.

## 2023-05-02 NOTE — Transfer of Care (Signed)
Immediate Anesthesia Transfer of Care Note  [name]  Procedure(s) Performed: TOTAL KNEE ARTHROPLASTY (Right: Knee)  Patient Location: PACU  Anesthesia Type:Spinal  Level of Consciousness: drowsy and patient cooperative  Airway & Oxygen Therapy: Patient Spontanous Breathing and Patient connected to face mask oxygen  Post-op Assessment: Report given to RN and Post -op Vital signs reviewed and stable  Post vital signs: Reviewed and stable  Last Vitals:  Vitals Value Taken Time  BP 92/62 05/02/23 1108  Temp    Pulse 61 05/02/23 1109  Resp 17 05/02/23 1109  SpO2 92 % 05/02/23 1109  Vitals shown include unfiled device data.  Last Pain:  Vitals:   05/02/23 0805  TempSrc:   PainSc: 0-No pain         Complications: No notable events documented.

## 2023-05-02 NOTE — Evaluation (Signed)
Physical Therapy Evaluation Patient Details Name: Brandon [name] MRN: [id number] DOB: 1946/04/12 Today's Date: 05/02/2023  History of Present Illness  77 yo male presents to therapy s/p R TKA on 05/02/2023 due to failure of conservative measures. Pt PMH includes but is not limited to: R TSA (10/08/2022), A-fib, Lumbar compression fx, L femoral neck fx s/p ORIF (2021), GERD, myasthenia gravis, HLD, CAD s/p CABG x 4, LBP s/p surgery, R index finger amputation (2015), L THA (2021) and L TKA (2002).  Clinical Impression     Brandon [name] is a 77 y.o. male POD 0 s/p R TKA. Patient reports mod I with mobility at baseline. Patient is now limited by functional impairments (see PT problem list below) and requires min A for bed mobility and min A and cues for transfers. Patient was unable to safely ambulate due to reports of pain 8/10 R LE and UE and nausea. Patient will benefit from continued skilled PT interventions to address impairments and progress towards PLOF. Acute PT will follow to progress mobility and stair training in preparation for safe discharge home with family support and OPPT services.       If plan is discharge home, recommend the following: A little help with walking and/or transfers;A little help with bathing/dressing/bathroom;Assistance with cooking/housework;Assist for transportation;Help with stairs or ramp for entrance   Can travel by private vehicle        Equipment Recommendations None recommended by PT (pt reports 2 RW in home setting)  Recommendations for Other Services       Functional Status Assessment Patient has had a recent decline in their functional status and demonstrates the ability to make significant improvements in function in a reasonable and predictable amount of time.     Precautions / Restrictions Precautions Precautions: Knee;Fall Restrictions Weight Bearing Restrictions: No      Mobility  Bed Mobility Overal bed mobility: Needs Assistance Bed  Mobility: Supine to Sit     Supine to sit: Min assist, HOB elevated, Used rails     General bed mobility comments: min cues    Transfers Overall transfer level: Needs assistance Equipment used: Rolling walker (2 wheels) Transfers: Sit to/from Stand, Bed to chair/wheelchair/BSC Sit to Stand: Min assist Stand pivot transfers: Min assist         General transfer comment: min cues for safety and proper UE placement to push to stand and then R UE on RW. pt has hx of R elbow arthritis and wears a support sleeve in home setting, family to provide for 9/17 pt electing to place R forearm on RW and cues and encouragement for proper UE placement, pt able to take some steps to pivot to RW however limited with progression with gait assessment due to pain and nausea.    Ambulation/Gait               General Gait Details: pt unable to participate with gait assesment at time of eval due to elevated pain reports and nausea, nurse provided pt medication to address nausea at end of tx session  Stairs            Wheelchair Mobility     Tilt Bed    Modified Rankin (Stroke Patients Only)       Balance  Pertinent Vitals/Pain Pain Assessment Pain Assessment: 0-10 Pain Score: 8  Pain Location: R LE and knee Pain Descriptors / Indicators: Aching, Constant, Discomfort, Grimacing, Operative site guarding (nauseating) Pain Intervention(s): Limited activity within patient's tolerance, Monitored during session, Premedicated before session, Repositioned, Patient requesting pain meds-RN notified, Ice applied    Home Living Family/patient expects to be discharged to:: Private residence Living Arrangements: Spouse/significant other Available Help at Discharge: Family Type of Home: House Home Access: Stairs to enter Entrance Stairs-Rails: None (able to use door frame) Entrance Stairs-Number of Steps: 1   Home Layout:  Multi-level;Able to live on main level with bedroom/bathroom Home Equipment: Rollator (4 wheels);Rolling Walker (2 wheels);Cane - single point;Shower seat;Wheelchair - manual      Prior Function Prior Level of Function : Independent/Modified Independent             Mobility Comments: pt elected to use SPC or rollator to navigate home pending pt stability. pt is mod I for all ADLs and self care tasks.       Extremity/Trunk Assessment        Lower Extremity Assessment Lower Extremity Assessment: RLE deficits/detail RLE Deficits / Details: ankle DF 4/5, PF 5/5; SLR with cues < 10 degree lag RLE Sensation: WNL    Cervical / Trunk Assessment Cervical / Trunk Assessment:  (wfl)  Communication   Communication Communication: Hearing impairment  Cognition Arousal: Alert Behavior During Therapy: WFL for tasks assessed/performed Overall Cognitive Status: Within Functional Limits for tasks assessed                                          General Comments      Exercises     Assessment/Plan    PT Assessment Patient needs continued PT services  PT Problem List Decreased strength;Decreased range of motion;Decreased activity tolerance;Decreased balance;Decreased mobility;Decreased coordination;Pain       PT Treatment Interventions DME instruction;Gait training;Stair training;Functional mobility training;Therapeutic activities;Therapeutic exercise;Balance training;Neuromuscular re-education;Patient/family education;Modalities    PT Goals (Current goals can be found in the Care Plan section)  Acute Rehab PT Goals 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 PT Goal Formulation: With patient Time For Goal Achievement: 05/16/23 Potential to Achieve Goals: Good    Frequency 7X/week     Co-evaluation               AM-PAC PT "6 Clicks" Mobility  Outcome Measure Help needed turning from your back to your side while in a flat  bed without using bedrails?: A Little Help needed moving from lying on your back to sitting on the side of a flat bed without using bedrails?: A Little Help needed moving to and from a bed to a chair (including a wheelchair)?: A Little Help needed standing up from a chair using your arms (e.g., wheelchair or bedside chair)?: A Little Help needed to walk in hospital room?: Total Help needed climbing 3-5 steps with a railing? : Total 6 Click Score: 14    End of Session Equipment Utilized During Treatment: Gait belt Activity Tolerance: Treatment limited secondary to medical complications (Comment);Patient limited by pain (nausea) Patient left: in chair;with call bell/phone within reach;with family/visitor present Nurse Communication: Mobility status;Patient requests pain meds;Other (comment) (nausea) PT Visit Diagnosis: Unsteadiness on feet (R26.81);Other abnormalities of gait and mobility (R26.89);Muscle weakness (generalized) (M62.81);Difficulty in walking, not elsewhere classified (R26.2);Pain Pain -  Right/Left: Right Pain - part of body: Knee;Leg (R elbow)    Time: 1610-9604 PT Time Calculation (min) (ACUTE ONLY): 28 min   Charges:   PT Evaluation $PT Eval Low Complexity: 1 Low PT Treatments $Therapeutic Activity: 8-22 mins PT General Charges $$ ACUTE PT VISIT: 1 Visit         Johnny Bridge, PT Acute Rehab   Jacqualyn Posey 05/02/2023, 4:53 PM

## 2023-05-02 NOTE — Anesthesia Postprocedure Evaluation (Signed)
Anesthesia Post Note  [name]  Procedure(s) Performed: TOTAL KNEE ARTHROPLASTY (Right: Knee)     Anesthesia Type: Spinal Anesthetic complications: no   No notable events documented.  Last Vitals:  Vitals:   05/02/23 1130 05/02/23 1145  BP: 112/87 124/70  Pulse: (!) 43 (!) 41  Resp: 12 16  Temp:    SpO2: 97% 100%    Last Pain:  Vitals:   05/02/23 1130  TempSrc:   PainSc: 0-No pain            L Sensory Level: S1-Sole of foot, small toes (05/02/23 1223) R Sensory Level: S1-Sole of foot, small toes (05/02/23 1223)  [place]

## 2023-05-02 NOTE — Care Plan (Signed)
Ortho Bundle Case Management Note  Patient Details  Name: Brandon [name] MRN: [id number] Date of Birth: [birth year]                  R TKA on 05-02-23  DCP: Home with wife  DME: No needs, has a RW  PT: [facility] [place] 05-05-23 at 3:15pm   DME Arranged:  N/A DME Agency:       Additional Comments: Please contact me with any questions of if this plan should need to change.   Ennis Forts, RN,CCM [facility]  [phone] 05/02/2023, 11:08 AM

## 2023-05-02 NOTE — Anesthesia Procedure Notes (Signed)
Spinal  Patient location during procedure: OR Start time: 05/02/2023 9:35 AM End time: 05/02/2023 9:37 AM Reason for block: surgical anesthesia Staffing Performed: anesthesiologist  Anesthesiologist: Gardner Nation, MD Performed by: [place] Nation, MD Authorized by: Onamia Nation, MD   Preanesthetic Checklist Completed: patient identified, IV checked, site marked, risks and benefits discussed, surgical consent, monitors and equipment checked, pre-op evaluation and timeout performed Spinal Block Patient position: sitting Prep: DuraPrep Patient monitoring: heart rate, cardiac monitor, continuous pulse ox and blood pressure Approach: midline Location: L4-5 Needle Needle type: Pencan  Needle gauge: 24 G Assessment Sensory level: T6 Additional Notes Functioning IV was confirmed and monitors were applied. Sterile prep and drape, including hand hygiene and sterile gloves were used. The patient was positioned and the spine was prepped. The skin was anesthetized with lidocaine.  Free flow of clear CSF was obtained prior to injecting local anesthetic into the CSF.  The spinal needle aspirated freely following injection.  The needle was carefully withdrawn.  The patient tolerated the procedure well.

## 2023-05-03 ENCOUNTER — Encounter (HOSPITAL_COMMUNITY): Payer: Self-pay | Admitting: Orthopedic Surgery

## 2023-05-03 DIAGNOSIS — M1711 Unilateral primary osteoarthritis, right knee: Secondary | ICD-10-CM | POA: Diagnosis not present

## 2023-05-03 DIAGNOSIS — Z85828 Personal history of other malignant neoplasm of skin: Secondary | ICD-10-CM | POA: Diagnosis not present

## 2023-05-03 DIAGNOSIS — I119 Hypertensive heart disease without heart failure: Secondary | ICD-10-CM | POA: Diagnosis not present

## 2023-05-03 DIAGNOSIS — Z8616 Personal history of COVID-19: Secondary | ICD-10-CM | POA: Diagnosis not present

## 2023-05-03 DIAGNOSIS — I2510 Atherosclerotic heart disease of native coronary artery without angina pectoris: Secondary | ICD-10-CM | POA: Diagnosis not present

## 2023-05-03 DIAGNOSIS — Z951 Presence of aortocoronary bypass graft: Secondary | ICD-10-CM | POA: Diagnosis not present

## 2023-05-03 LAB — BASIC METABOLIC PANEL
Anion gap: 6 (ref 5–15)
BUN: 11 mg/dL (ref 8–23)
CO2: 26 mmol/L (ref 22–32)
Calcium: 8 mg/dL — ABNORMAL LOW (ref 8.9–10.3)
Chloride: 103 mmol/L (ref 98–111)
Creatinine, Ser: 0.86 mg/dL (ref 0.61–1.24)
GFR, Estimated: >60 mL/min (ref >60)
Glucose, Bld: 141 mg/dL — ABNORMAL HIGH (ref 70–99)
Potassium: 3.3 mmol/L — ABNORMAL LOW (ref 3.5–5.1)
Sodium: 135 mmol/L (ref 135–145)

## 2023-05-03 LAB — CBC
HCT: 36.1 % — ABNORMAL LOW (ref 39.0–52.0)
Hemoglobin: 11.7 g/dL — ABNORMAL LOW (ref 13.0–17.0)
MCH: 30.2 pg (ref 26.0–34.0)
MCHC: 32.4 g/dL (ref 30.0–36.0)
MCV: 93 fL (ref 80.0–100.0)
Platelets: 179 10*3/uL (ref 150–400)
RBC: 3.88 MIL/uL — ABNORMAL LOW (ref 4.22–5.81)
RDW: 13.8 % (ref 11.5–15.5)
WBC: 9.6 10*3/uL (ref 4.0–10.5)
nRBC: 0 % (ref 0.0–0.2)

## 2023-05-03 MED ORDER — OXYCODONE HCL 5 MG PO TABS: 5.0000 mg | ORAL_TABLET | Freq: Three times a day (TID) | ORAL | 0 refills | Status: DC | PRN

## 2023-05-03 MED ORDER — ASPIRIN 81 MG PO CHEW: 81.0000 mg | CHEWABLE_TABLET | Freq: Two times a day (BID) | ORAL | 0 refills | Status: AC

## 2023-05-03 MED ORDER — ONDANSETRON HCL 4 MG PO TABS: 4.0000 mg | ORAL_TABLET | Freq: Four times a day (QID) | ORAL | 0 refills | Status: DC | PRN

## 2023-05-03 MED ORDER — TRAMADOL HCL 50 MG PO TABS: 50.0000 mg | ORAL_TABLET | Freq: Four times a day (QID) | ORAL | 0 refills | Status: DC | PRN

## 2023-05-03 MED ORDER — METHOCARBAMOL 500 MG PO TABS: 500.0000 mg | ORAL_TABLET | Freq: Four times a day (QID) | ORAL | 0 refills | Status: DC | PRN

## 2023-05-03 NOTE — Progress Notes (Signed)
Physical Therapy Treatment Patient Details Name: Brandon [name] MRN: [id number] DOB: [birth year] Today's Date: 05/03/2023   History of Present Illness 77 yo male presents to therapy s/p R TKA on 05/02/2023 due to failure of conservative measures. Pt PMH includes but is not limited to: R TSA (10/08/2022), A-fib, Lumbar compression fx, L femoral neck fx s/p ORIF (2021), GERD, myasthenia gravis, HLD, CAD s/p CABG x 4, LBP s/p surgery, R index finger amputation (2015), L THA (2021) and L TKA (2002).    PT Comments  POD # 1 am session limited by pain Spouse present and observed session Assisted OOB to amb only a few feet.  General transfer comment: repeat VC's on proper hand placement and R LE advancement with increased VC's on safety with turns.  Limited by 9/10 pain.  General Gait Details: 75% VC's on proper sequencving as well as proper walker to self distance.  VERY limited amb distance due to 9/10 knee pain.  Assisted to recliner and applied ICE to RIGHT knee and a small heat pad to R thigh. Will see again after pain meds.     If plan is discharge home, recommend the following: A little help with walking and/or transfers;A little help with bathing/dressing/bathroom;Assistance with cooking/housework;Assist for transportation;Help with stairs or ramp for entrance   Can travel by private vehicle        Equipment Recommendations  None recommended by PT    Recommendations for Other Services       Precautions / Restrictions Precautions Precautions: Knee;Fall Precaution Comments: no pillow under knee Restrictions Weight Bearing Restrictions: No     Mobility  Bed Mobility Overal bed mobility: Needs Assistance Bed Mobility: Supine to Sit     Supine to sit: Min assist, HOB elevated, Used rails, Contact guard     General bed mobility comments: demonstarted and instructed how to use belt to self asisst LE    Transfers Overall transfer level: Needs assistance Equipment used: Rolling  walker (2 wheels) Transfers: Sit to/from Stand Sit to Stand: Contact guard assist, Supervision           General transfer comment: repeat VC's on proper hand placement and R LE advancement with increased VC's on safety with turns.  Limited by 9/10 pain    Ambulation/Gait Ambulation/Gait assistance: Contact guard assist, Min assist Gait Distance (Feet): 5 Feet Assistive device: Rolling walker (2 wheels) Gait Pattern/deviations: Step-to pattern, Decreased stance time - right       General Gait Details: 75% VC's on proper sequencving as well as proper walker to self distance.  VERY limited amb distance due to 9/10 knee pain.   Stairs             Wheelchair Mobility     Tilt Bed    Modified Rankin (Stroke Patients Only)       Balance                                            Cognition Arousal: Alert Behavior During Therapy: WFL for tasks assessed/performed Overall Cognitive Status: Within Functional Limits for tasks assessed                                 General Comments: AxO x 3 slight cognitive impairement requiring repeat instructions but functional.  Pt also HOH.  Exercises      General Comments        Pertinent Vitals/Pain Pain Assessment Pain Assessment: 0-10 Pain Score: 9  Pain Location: R LE and knee Pain Descriptors / Indicators: Constant, Discomfort, Grimacing, Operative site guarding Pain Intervention(s): Patient requesting pain meds-RN notified, Repositioned, Monitored during session, Ice applied    Home Living                          Prior Function            PT Goals (current goals can now be found in the care plan section) Progress towards PT goals: Progressing toward goals    Frequency    7X/week      PT Plan      Co-evaluation              AM-PAC PT "6 Clicks" Mobility   Outcome Measure  Help needed turning from your back to your side while in a flat bed  without using bedrails?: A Little Help needed moving from lying on your back to sitting on the side of a flat bed without using bedrails?: A Little Help needed moving to and from a bed to a chair (including a wheelchair)?: A Little Help needed standing up from a chair using your arms (e.g., wheelchair or bedside chair)?: A Little Help needed to walk in hospital room?: A Lot Help needed climbing 3-5 steps with a railing? : A Lot 6 Click Score: 16    End of Session Equipment Utilized During Treatment: Gait belt Activity Tolerance: Treatment limited secondary to medical complications (Comment);Patient limited by pain Patient left: in chair;with call bell/phone within reach;with family/visitor present Nurse Communication: Mobility status;Patient requests pain meds PT Visit Diagnosis: Unsteadiness on feet (R26.81);Other abnormalities of gait and mobility (R26.89);Muscle weakness (generalized) (M62.81);Difficulty in walking, not elsewhere classified (R26.2);Pain Pain - Right/Left: Right Pain - part of body: Knee;Leg     Time: 1004-1030 PT Time Calculation (min) (ACUTE ONLY): 26 min  Charges:    $Gait Training: 8-22 mins $Therapeutic Activity: 8-22 mins PT General Charges $$ ACUTE PT VISIT: 1 Visit                     Felecia Shelling  PTA Acute  Rehabilitation Services Office M-F          [phone]

## 2023-05-03 NOTE — TOC Transition Note (Signed)
Transition of Care [facility]) - CM/SW Discharge Note   Patient Details  Name: Brandon [name] MRN: [id number] Date of Birth: 01-13-1946  Transition of Care [facility] CM/SW Contact:  Amada Jupiter, LCSW Phone Number: 05/03/2023, 10:47 AM   Clinical Narrative:     Met with pt and confirming he has needed DME in the home.  OPPT already arranged with [facility] ([place]).  No TOC needs.  Final next level of care: OP Rehab Barriers to Discharge: No Barriers Identified   Patient Goals and CMS Choice      Discharge Placement                         Discharge Plan and Services Additional resources added to the After Visit Summary for                  DME Arranged: N/A                    Social Determinants of Health (SDOH) Interventions SDOH Screenings   Food Insecurity: No Food Insecurity (05/02/2023)  Housing: Low Risk  (05/02/2023)  Transportation Needs: No Transportation Needs (05/02/2023)  Utilities: Not At Risk (05/02/2023)  Financial Resource Strain: Low Risk  (04/21/2022)  Tobacco Use: Medium Risk (05/02/2023)     Readmission Risk Interventions     No data to display

## 2023-05-03 NOTE — Plan of Care (Signed)
  Problem: Education: Goal: Knowledge of the prescribed therapeutic regimen will improve Outcome: Progressing   Problem: Activity: Goal: Range of joint motion will improve Outcome: Progressing   Problem: Pain Management: Goal: Pain level will decrease with appropriate interventions Outcome: Progressing   Problem: Education: Goal: Knowledge of General Education information will improve Description: Including pain rating scale, medication(s)/side effects and non-pharmacologic comfort measures Outcome: Progressing   Problem: Pain Management: Goal: Pain level will decrease with appropriate interventions Outcome: Progressing

## 2023-05-03 NOTE — Care Management Obs Status (Signed)
MEDICARE OBSERVATION STATUS NOTIFICATION   Patient Details  Name: Brandon [name] MRN: [id number] Date of Birth: [birth year]   Medicare Observation Status Notification Given:  Hart Robinsons, LCSW 05/03/2023, 10:48 AM

## 2023-05-03 NOTE — Progress Notes (Signed)
Physical Therapy Treatment Patient Details Name: Brandon [name] MRN: [id number] DOB: 03-Jun-1946 Today's Date: 05/03/2023   History of Present Illness 77 yo male presents to therapy s/p R TKA on 05/02/2023 due to failure of conservative measures. Pt PMH includes but is not limited to: R TSA (10/08/2022), A-fib, Lumbar compression fx, L femoral neck fx s/p ORIF (2021), GERD, myasthenia gravis, HLD, CAD s/p CABG x 4, LBP s/p surgery, R index finger amputation (2015), L THA (2021) and L TKA (2002).    PT Comments  POD # 1 pm session Assisted with amb a greater distance in hallway with 5/10 knee pain.  Practiced ONE step twice with Spouse "hands on" assisting using safety belt.  Then returned to room to perform some TE's following HEP handout.  Instructed on proper tech, freq as well as use of ICE.   Addressed all mobility questions, discussed appropriate activity, educated on use of ICE.  Pt ready for D/C to home.    If plan is discharge home, recommend the following: A little help with walking and/or transfers;A little help with bathing/dressing/bathroom;Assistance with cooking/housework;Assist for transportation;Help with stairs or ramp for entrance   Can travel by private vehicle        Equipment Recommendations  None recommended by PT    Recommendations for Other Services       Precautions / Restrictions Precautions Precautions: Knee;Fall Precaution Comments: no pillow under knee Restrictions Weight Bearing Restrictions: No     Mobility  Bed Mobility Overal bed mobility: Needs Assistance Bed Mobility: Supine to Sit     Supine to sit: Min assist, HOB elevated, Used rails, Contact guard     General bed mobility comments: demonstarted and instructed how to use belt to self asisst LE    Transfers Overall transfer level: Needs assistance Equipment used: Rolling walker (2 wheels) Transfers: Sit to/from Stand Sit to Stand: Contact guard assist, Supervision            General transfer comment: repeat VC's on proper hand placement and R LE advancement with increased VC's on safety with turns. Pain 5/10    Ambulation/Gait Ambulation/Gait assistance: Supervision, Contact guard assist Gait Distance (Feet): 65 Feet Assistive device: Rolling walker (2 wheels) Gait Pattern/deviations: Step-to pattern, Decreased stance time - right       General Gait Details: 25% VC's on proper sequencving as well as proper walker to self distance.  tolerated an increased distance. Had Spouse "hands on" asisst pt under Therapist Instructions on safe handling.   Stairs Stairs: Yes Stairs assistance: Min assist, Contact guard assist Stair Management: No rails, Step to pattern, Forwards, With walker Number of Stairs: 1 General stair comments: Pt has ONE step to enter home.  50% VC's on proper walker placement as well as proper sequencing.  Had Spouse "hands on" assist pt using safety belt.   Wheelchair Mobility     Tilt Bed    Modified Rankin (Stroke Patients Only)       Balance                                            Cognition Arousal: Alert Behavior During Therapy: WFL for tasks assessed/performed Overall Cognitive Status: Within Functional Limits for tasks assessed  General Comments: AxO x 3 slight cognitive impairement requiring repeat instructions but functional.  Pt also HOH.        Exercises  Total Knee Replacement TE's following HEP handout 10 reps B LE ankle pumps 05 reps towel squeezes 05 reps knee presses 05 reps heel slides  05 reps SAQ's 05 reps SLR's 05 reps ABD Educated on use of gait belt to assist with TE's Followed by ICE     General Comments        Pertinent Vitals/Pain Pain Assessment Pain Assessment: 0-10 Pain Score: 5  Pain Location: R LE and knee Pain Descriptors / Indicators: Constant, Discomfort, Grimacing, Operative site guarding Pain  Intervention(s): Patient requesting pain meds-RN notified, Repositioned, Monitored during session, Ice applied    Home Living                          Prior Function            PT Goals (current goals can now be found in the care plan section) Progress towards PT goals: Progressing toward goals    Frequency    7X/week      PT Plan      Co-evaluation              AM-PAC PT "6 Clicks" Mobility   Outcome Measure  Help needed turning from your back to your side while in a flat bed without using bedrails?: A Little Help needed moving from lying on your back to sitting on the side of a flat bed without using bedrails?: A Little Help needed moving to and from a bed to a chair (including a wheelchair)?: A Little Help needed standing up from a chair using your arms (e.g., wheelchair or bedside chair)?: A Little Help needed to walk in hospital room?: A Lot Help needed climbing 3-5 steps with a railing? : A Lot 6 Click Score: 16    End of Session Equipment Utilized During Treatment: Gait belt Activity Tolerance: Treatment limited secondary to medical complications (Comment);Patient limited by pain Patient left: in chair;with call bell/phone within reach;with family/visitor present Nurse Communication: Mobility status;Patient requests pain meds PT Visit Diagnosis: Unsteadiness on feet (R26.81);Other abnormalities of gait and mobility (R26.89);Muscle weakness (generalized) (M62.81);Difficulty in walking, not elsewhere classified (R26.2);Pain Pain - Right/Left: Right Pain - part of body: Knee;Leg     Time: 1332-1400 PT Time Calculation (min) (ACUTE ONLY): 28 min  Charges:    $Gait Training: 8-22 mins $Therapeutic Exercise: 8-22 mins PT General Charges $$ ACUTE PT VISIT: 1 Visit                    Felecia Shelling  PTA Acute  Rehabilitation Services Office M-F          [phone]

## 2023-05-03 NOTE — Progress Notes (Signed)
Subjective: 1 Day Post-Op Procedure(s) (LRB): TOTAL KNEE ARTHROPLASTY (Right) Patient reports pain as mild.   Patient seen in rounds by Dr. Lequita Halt. Patient is well, and has had no acute complaints or problems No issues overnight. Denies chest pain, SOB, or calf pain. Foley catheter removed this AM.  We will continue therapy today.  Objective: Vital signs in last 24 hours: Temp:  [97.5 F (36.4 C)-98.6 F (37 C)] 98.3 F (36.8 C) (09/17 0558) Pulse Rate:  [41-64] 58 (09/17 0558) Resp:  [12-19] 14 (09/17 0558) BP: (103-147)/(60-88) 120/70 (09/17 0558) SpO2:  [90 %-100 %] 95 % (09/17 0558)  Intake/Output from previous day:  Intake/Output Summary (Last 24 hours) at 05/03/2023 0824 Last data filed at 05/03/2023 0630 Gross per 24 hour  Intake 3625.99 ml  Output 1810 ml  Net 1815.99 ml     Intake/Output this shift: No intake/output data recorded.  Labs: Recent Labs    05/03/23 0349  HGB 11.7*   Recent Labs    05/03/23 0349  WBC 9.6  RBC 3.88*  HCT 36.1*  PLT 179   Recent Labs    05/03/23 0349  NA 135  K 3.3*  CL 103  CO2 26  BUN 11  CREATININE 0.86  GLUCOSE 141*  CALCIUM 8.0*   No results for input(s): "LABPT", "INR" in the last 72 hours.  Exam: General - Patient is Alert and Oriented Extremity - Neurologically intact Neurovascular intact Sensation intact distally Dorsiflexion/Plantar flexion intact Dressing - dressing C/D/I Motor Function - intact, moving foot and toes well on exam.   Past Medical History:  Diagnosis Date   Arthritis    Coronary atherosclerosis of native coronary artery    Multivessel status post CABG March 2015   DDD (degenerative disc disease), lumbar    Essential hypertension    GERD (gastroesophageal reflux disease)    Hard of hearing both ears    Left ear is worst   Hemorrhoids    History of gout    History of kidney stones    history of myasthenia Gravis 2021   History of [place] spotted fever    Left  ureteral  & renal stone    Lumbago    Mixed hyperlipidemia    Personal history of COVID-19 08/10/2021   Paxlovid   Plantar fascial fibromatosis    Postoperative atrial fibrillation (HCC)    Skin Cancer (HCC)    Use of cane as ambulatory aid    Uses roller walker    UTI (urinary tract infection) 09/16/2021   Wears glasses     Assessment/Plan: 1 Day Post-Op Procedure(s) (LRB): TOTAL KNEE ARTHROPLASTY (Right) Principal Problem:   OA (osteoarthritis) of knee Active Problems:   Osteoarthritis of right knee  Estimated body mass index is 25.15 kg/m as calculated from the following:   Height as of this encounter: 6\' 1"  (1.854 m).   Weight as of this encounter: 86.5 kg. Advance diet Up with therapy D/C IV fluids   Patient's anticipated LOS is less than 2 midnights, meeting these requirements: - Lives within 1 hour of care - Has a competent adult at home to recover with post-op recover - NO history of  - Chronic pain requiring opioids  - Diabetes  - Coronary Artery Disease  - Heart failure  - Heart attack  - Stroke  - DVT/VTE  - Cardiac arrhythmia  - Respiratory Failure/COPD  - Renal failure  - Anemia  - Advanced Liver disease   DVT Prophylaxis - Aspirin  Weight bearing as tolerated. Continue therapy.  Plan is to go Home after hospital stay. Plan for discharge later today if progresses with therapy and meeting goals. Scheduled for OPPT at [facility]). Follow-up in the office in 2 weeks.  The PDMP database was reviewed today prior to any opioid medications being prescribed to this patient.  Arther Abbott, PA-C Orthopedic Surgery [phone] 05/03/2023, 8:24 AM

## 2023-05-03 NOTE — Progress Notes (Signed)
Physical Therapy Treatment Patient Details Name: Brandon [name] MRN: [id number] DOB: May 18, 1946 Today's Date: 05/03/2023   History of Present Illness 77 yo male presents to therapy s/p R TKA on 05/02/2023 due to failure of conservative measures. Pt PMH includes but is not limited to: R TSA (10/08/2022), A-fib, Lumbar compression fx, L femoral neck fx s/p ORIF (2021), GERD, myasthenia gravis, HLD, CAD s/p CABG x 4, LBP s/p surgery, R index finger amputation (2015), L THA (2021) and L TKA (2002).    PT Comments  POD # 1 am session (part 2) after pain meds Spouse present and observed  Assisted with amb in hallway.  General Gait Details: 25% VC's on proper sequencving as well as proper walker to self distance.  tolerated an increased distance. Then returned to room to perform some TE's following HEP handout.  Instructed on proper tech, freq as well as use of ICE.   Will see pt after lunch for stair training and completion of HEP.     If plan is discharge home, recommend the following: A little help with walking and/or transfers;A little help with bathing/dressing/bathroom;Assistance with cooking/housework;Assist for transportation;Help with stairs or ramp for entrance   Can travel by private vehicle        Equipment Recommendations  None recommended by PT    Recommendations for Other Services       Precautions / Restrictions Precautions Precautions: Knee;Fall Precaution Comments: no pillow under knee Restrictions Weight Bearing Restrictions: No     Mobility  Bed Mobility Overal bed mobility: Needs Assistance Bed Mobility: Supine to Sit     Supine to sit: Min assist, HOB elevated, Used rails, Contact guard     General bed mobility comments: demonstarted and instructed how to use belt to self asisst LE    Transfers Overall transfer level: Needs assistance Equipment used: Rolling walker (2 wheels) Transfers: Sit to/from Stand Sit to Stand: Contact guard assist, Supervision            General transfer comment: repeat VC's on proper hand placement and R LE advancement with increased VC's on safety with turns. Pain 5/10    Ambulation/Gait Ambulation/Gait assistance: Contact guard assist, Min assist Gait Distance (Feet): 52 Feet Assistive device: Rolling walker (2 wheels) Gait Pattern/deviations: Step-to pattern, Decreased stance time - right       General Gait Details: 25% VC's on proper sequencving as well as proper walker to self distance.  tolerated an increased distance.   Stairs             Wheelchair Mobility     Tilt Bed    Modified Rankin (Stroke Patients Only)       Balance                                            Cognition Arousal: Alert Behavior During Therapy: WFL for tasks assessed/performed Overall Cognitive Status: Within Functional Limits for tasks assessed                                 General Comments: AxO x 3 slight cognitive impairement requiring repeat instructions but functional.  Pt also HOH.        Exercises  Total Knee Replacement TE's following HEP handout 10 reps B LE ankle pumps 05 reps towel squeezes 05 reps  knee presses Educated on use of gait belt to assist with TE's Followed by ICE    General Comments        Pertinent Vitals/Pain Pain Assessment Pain Assessment: 0-10 Pain Score: 5  Pain Location: R LE and knee Pain Descriptors / Indicators: Constant, Discomfort, Grimacing, Operative site guarding Pain Intervention(s): Patient requesting pain meds-RN notified, Repositioned, Monitored during session, Ice applied    Home Living                          Prior Function            PT Goals (current goals can now be found in the care plan section) Progress towards PT goals: Progressing toward goals    Frequency    7X/week      PT Plan      Co-evaluation              AM-PAC PT "6 Clicks" Mobility   Outcome Measure  Help  needed turning from your back to your side while in a flat bed without using bedrails?: A Little Help needed moving from lying on your back to sitting on the side of a flat bed without using bedrails?: A Little Help needed moving to and from a bed to a chair (including a wheelchair)?: A Little Help needed standing up from a chair using your arms (e.g., wheelchair or bedside chair)?: A Little Help needed to walk in hospital room?: A Lot Help needed climbing 3-5 steps with a railing? : A Lot 6 Click Score: 16    End of Session Equipment Utilized During Treatment: Gait belt Activity Tolerance: Treatment limited secondary to medical complications (Comment);Patient limited by pain Patient left: in chair;with call bell/phone within reach;with family/visitor present Nurse Communication: Mobility status;Patient requests pain meds PT Visit Diagnosis: Unsteadiness on feet (R26.81);Other abnormalities of gait and mobility (R26.89);Muscle weakness (generalized) (M62.81);Difficulty in walking, not elsewhere classified (R26.2);Pain Pain - Right/Left: Right Pain - part of body: Knee;Leg     Time: 1120-1145 PT Time Calculation (min) (ACUTE ONLY): 25 min  Charges:    $Gait Training: 8-22 mins $Therapeutic Exercise: 8-22 mins PT General Charges $$ ACUTE PT VISIT: 1 Visit                     Felecia Shelling  PTA Acute  Rehabilitation Services Office M-F          [phone]

## 2023-05-05 DIAGNOSIS — M25562 Pain in left knee: Secondary | ICD-10-CM | POA: Diagnosis not present

## 2023-05-05 DIAGNOSIS — M25662 Stiffness of left knee, not elsewhere classified: Secondary | ICD-10-CM | POA: Diagnosis not present

## 2023-05-05 NOTE — Discharge Summary (Signed)
Patient ID: Brandon [name] MRN: [id number] DOB/AGE: [birth year] 77 y.o.  Admit date: 05/02/2023 Discharge date: 05/03/2023  Admission Diagnoses:  Principal Problem:   OA (osteoarthritis) of knee Active Problems:   Osteoarthritis of right knee   Discharge Diagnoses:  Same  Past Medical History:  Diagnosis Date   Arthritis    Coronary atherosclerosis of native coronary artery    Multivessel status post CABG March 2015   DDD (degenerative disc disease), lumbar    Essential hypertension    GERD (gastroesophageal reflux disease)    Hard of hearing both ears    Left ear is worst   Hemorrhoids    History of gout    History of kidney stones    history of myasthenia Gravis 2021   History of [facility] spotted fever    Left ureteral  & renal stone    Lumbago    Mixed hyperlipidemia    Personal history of COVID-19 08/10/2021   Paxlovid   Plantar fascial fibromatosis    Postoperative atrial fibrillation (HCC)    Skin Cancer (HCC)    Use of cane as ambulatory aid    Uses roller walker    UTI (urinary tract infection) 09/16/2021   Wears glasses     Surgeries: Procedure(s): TOTAL KNEE ARTHROPLASTY on 05/02/2023   Consultants:   Discharged Condition: Improved  Hospital Course: Brandon [name] is an 77 y.o. male who was admitted 05/02/2023 for operative treatment ofOA (osteoarthritis) of knee. Patient has severe unremitting pain that affects sleep, daily activities, and work/hobbies. After pre-op clearance the patient was taken to the operating room on 05/02/2023 and underwent  Procedure(s): TOTAL KNEE ARTHROPLASTY.    Patient was given perioperative antibiotics:  Anti-infectives (From admission, onward)    Start     Dose/Rate Route Frequency Ordered Stop   05/02/23 1600  ceFAZolin (ANCEF) IVPB 2g/100 mL premix        2 g 200 mL/hr over 30 Minutes Intravenous Every 6 hours 05/02/23 1327 05/02/23 2350   05/02/23 0645  ceFAZolin (ANCEF) IVPB 2g/100 mL premix        2 g 200  mL/hr over 30 Minutes Intravenous On call to O.R. 05/02/23 8295 05/02/23 0942   05/02/23 0645  vancomycin (VANCOCIN) IVPB 1000 mg/200 mL premix        1,000 mg 200 mL/hr over 60 Minutes Intravenous On call to O.R. 05/02/23 6213 05/02/23 0932        Patient was given sequential compression devices, early ambulation, and chemoprophylaxis to prevent DVT.  Patient benefited maximally from hospital stay and there were no complications.    Recent vital signs: No data found.   Recent laboratory studies:  Recent Labs    05/03/23 0349  WBC 9.6  HGB 11.7*  HCT 36.1*  PLT 179  NA 135  K 3.3*  CL 103  CO2 26  BUN 11  CREATININE 0.86  GLUCOSE 141*  CALCIUM 8.0*     Discharge Medications:   Allergies as of 05/03/2023       Reactions   Gabapentin Other (See Comments)   Hallucinations        Medication List     STOP taking these medications    aspirin EC 81 MG tablet Replaced by: aspirin 81 MG chewable tablet       TAKE these medications    allopurinol 300 MG tablet Commonly known as: ZYLOPRIM Take 150 mg by mouth in the morning.   amLODipine 5 MG tablet Commonly known  as: NORVASC Take 1 tablet (5 mg total) by mouth daily.   aspirin 81 MG chewable tablet Chew 1 tablet (81 mg total) by mouth 2 (two) times daily for 20 days. Then resume one 81 mg aspirin once a day Replaces: aspirin EC 81 MG tablet   CALCIUM 600+D3 PO Take 1 tablet by mouth in the morning and at bedtime.   chlorhexidine 4 % external liquid Commonly known as: HIBICLENS Apply 15 mLs (1 Application total) topically as directed for 30 doses. Use as directed daily for 5 days every other week for 6 weeks.   clotrimazole-betamethasone cream Commonly known as: LOTRISONE Apply 1 Application topically 2 (two) times daily as needed (rash).   Fish Oil 500 MG Caps Take 500 mg by mouth in the morning.   methocarbamol 500 MG tablet Commonly known as: ROBAXIN Take 1 tablet (500 mg total) by mouth  every 6 (six) hours as needed for muscle spasms.   metoprolol tartrate 25 MG tablet Commonly known as: LOPRESSOR Take 1/2 (one-half) tablet by mouth twice daily   multivitamin tablet Take 1 tablet by mouth daily.   mupirocin ointment 2 % Commonly known as: BACTROBAN Place 1 Application into the nose 2 (two) times daily for 60 doses. Use as directed 2 times daily for 5 days every other week for 6 weeks.   nitroGLYCERIN 0.4 MG SL tablet Commonly known as: NITROSTAT Place 0.4 mg under the tongue every 5 (five) minutes as needed for chest pain.   ondansetron 4 MG tablet Commonly known as: ZOFRAN Take 1 tablet (4 mg total) by mouth every 6 (six) hours as needed for nausea.   Osteo Bi-Flex Adv Triple St Tabs Take 1 tablet by mouth in the morning.   oxyCODONE 5 MG immediate release tablet Commonly known as: Oxy IR/ROXICODONE Take 1-2 tablets (5-10 mg total) by mouth every 8 (eight) hours as needed for severe pain.   pantoprazole 40 MG tablet Commonly known as: PROTONIX Take 40 mg by mouth daily.   pravastatin 40 MG tablet Commonly known as: PRAVACHOL Take 40 mg by mouth every evening.   pyridostigmine 60 MG tablet Commonly known as: MESTINON Take 0.5 tablets (30 mg total) by mouth 3 (three) times daily. What changed: when to take this   tamsulosin 0.4 MG Caps capsule Commonly known as: FLOMAX Take 1 capsule by mouth daily.   traMADol 50 MG tablet Commonly known as: ULTRAM Take 1-2 tablets (50-100 mg total) by mouth every 6 (six) hours as needed for moderate pain.   vitamin C 1000 MG tablet Take 1,000 mg by mouth in the morning.               Discharge Care Instructions  (From admission, onward)           Start     Ordered   05/03/23 0000  Weight bearing as tolerated        05/03/23 0826   05/03/23 0000  Change dressing       Comments: You may remove the bulky bandage (ACE wrap and gauze) two days after surgery. You will have an adhesive waterproof  bandage underneath. Leave this in place until your first follow-up appointment.   05/03/23 0826            Diagnostic Studies: No results found.  Disposition: Discharge disposition: 01-Home or Self Care       Discharge Instructions     Call MD / Call 911   Complete by: As directed  If you experience chest pain or shortness of breath, CALL 911 and be transported to the hospital emergency room.  If you develope a fever above 101 F, pus (white drainage) or increased drainage or redness at the wound, or calf pain, call your surgeon's office.   Change dressing   Complete by: As directed    You may remove the bulky bandage (ACE wrap and gauze) two days after surgery. You will have an adhesive waterproof bandage underneath. Leave this in place until your first follow-up appointment.   Constipation Prevention   Complete by: As directed    Drink plenty of fluids.  Prune juice may be helpful.  You may use a stool softener, such as Colace (over the counter) 100 mg twice a day.  Use MiraLax (over the counter) for constipation as needed.   Diet - low sodium heart healthy   Complete by: As directed    Do not put a pillow under the knee. Place it under the heel.   Complete by: As directed    Driving restrictions   Complete by: As directed    No driving for two weeks   Post-operative opioid taper instructions:   Complete by: As directed    POST-OPERATIVE OPIOID TAPER INSTRUCTIONS: It is important to wean off of your opioid medication as soon as possible. If you do not need pain medication after your surgery it is ok to stop day one. Opioids include: Codeine, Hydrocodone(Norco, Vicodin), Oxycodone(Percocet, oxycontin) and hydromorphone amongst others.  Long term and even short term use of opiods can cause: Increased pain response Dependence Constipation Depression Respiratory depression And more.  Withdrawal symptoms can include Flu like symptoms Nausea, vomiting And  more Techniques to manage these symptoms Hydrate well Eat regular healthy meals Stay active Use relaxation techniques(deep breathing, meditating, yoga) Do Not substitute Alcohol to help with tapering If you have been on opioids for less than two weeks and do not have pain than it is ok to stop all together.  Plan to wean off of opioids This plan should start within one week post op of your joint replacement. Maintain the same interval or time between taking each dose and first decrease the dose.  Cut the total daily intake of opioids by one tablet each day Next start to increase the time between doses. The last dose that should be eliminated is the evening dose.      TED hose   Complete by: As directed    Use stockings (TED hose) for three weeks on both leg(s).  You may remove them at night for sleeping.   Weight bearing as tolerated   Complete by: As directed         Follow-up Information     Ollen Gross, MD. Go on 05/17/2023.   Specialty: Orthopedic Surgery Why: You are scheduled for first post op appt on Tuesday October 1 at 2:45pm. Contact information: [address] [place] [address] [phone]                  Signed: Arther Abbott 05/05/2023, 8:20 AM

## 2023-05-09 DIAGNOSIS — M25662 Stiffness of left knee, not elsewhere classified: Secondary | ICD-10-CM | POA: Diagnosis not present

## 2023-05-09 DIAGNOSIS — M25562 Pain in left knee: Secondary | ICD-10-CM | POA: Diagnosis not present

## 2023-05-10 DIAGNOSIS — Z96659 Presence of unspecified artificial knee joint: Secondary | ICD-10-CM | POA: Diagnosis not present

## 2023-05-10 DIAGNOSIS — M1711 Unilateral primary osteoarthritis, right knee: Secondary | ICD-10-CM | POA: Diagnosis not present

## 2023-05-10 DIAGNOSIS — E663 Overweight: Secondary | ICD-10-CM | POA: Diagnosis not present

## 2023-05-10 DIAGNOSIS — Z6826 Body mass index (BMI) 26.0-26.9, adult: Secondary | ICD-10-CM | POA: Diagnosis not present

## 2023-05-11 DIAGNOSIS — M25662 Stiffness of left knee, not elsewhere classified: Secondary | ICD-10-CM | POA: Diagnosis not present

## 2023-05-11 DIAGNOSIS — M25562 Pain in left knee: Secondary | ICD-10-CM | POA: Diagnosis not present

## 2023-05-13 DIAGNOSIS — M25562 Pain in left knee: Secondary | ICD-10-CM | POA: Diagnosis not present

## 2023-05-13 DIAGNOSIS — M25662 Stiffness of left knee, not elsewhere classified: Secondary | ICD-10-CM | POA: Diagnosis not present

## 2023-05-16 DIAGNOSIS — R350 Frequency of micturition: Secondary | ICD-10-CM | POA: Diagnosis not present

## 2023-05-16 DIAGNOSIS — R3915 Urgency of urination: Secondary | ICD-10-CM | POA: Diagnosis not present

## 2023-05-17 DIAGNOSIS — M25562 Pain in left knee: Secondary | ICD-10-CM | POA: Diagnosis not present

## 2023-05-17 DIAGNOSIS — M25662 Stiffness of left knee, not elsewhere classified: Secondary | ICD-10-CM | POA: Diagnosis not present

## 2023-05-18 DIAGNOSIS — M25662 Stiffness of left knee, not elsewhere classified: Secondary | ICD-10-CM | POA: Diagnosis not present

## 2023-05-18 DIAGNOSIS — M25562 Pain in left knee: Secondary | ICD-10-CM | POA: Diagnosis not present

## 2023-05-20 DIAGNOSIS — M25662 Stiffness of left knee, not elsewhere classified: Secondary | ICD-10-CM | POA: Diagnosis not present

## 2023-05-20 DIAGNOSIS — M25562 Pain in left knee: Secondary | ICD-10-CM | POA: Diagnosis not present

## 2023-05-23 DIAGNOSIS — M25562 Pain in left knee: Secondary | ICD-10-CM | POA: Diagnosis not present

## 2023-05-23 DIAGNOSIS — M25662 Stiffness of left knee, not elsewhere classified: Secondary | ICD-10-CM | POA: Diagnosis not present

## 2023-05-25 DIAGNOSIS — N3000 Acute cystitis without hematuria: Secondary | ICD-10-CM | POA: Diagnosis not present

## 2023-05-25 DIAGNOSIS — M25662 Stiffness of left knee, not elsewhere classified: Secondary | ICD-10-CM | POA: Diagnosis not present

## 2023-05-25 DIAGNOSIS — M25562 Pain in left knee: Secondary | ICD-10-CM | POA: Diagnosis not present

## 2023-05-27 DIAGNOSIS — M25662 Stiffness of left knee, not elsewhere classified: Secondary | ICD-10-CM | POA: Diagnosis not present

## 2023-05-27 DIAGNOSIS — M25562 Pain in left knee: Secondary | ICD-10-CM | POA: Diagnosis not present

## 2023-05-30 DIAGNOSIS — M25662 Stiffness of left knee, not elsewhere classified: Secondary | ICD-10-CM | POA: Diagnosis not present

## 2023-05-30 DIAGNOSIS — M25562 Pain in left knee: Secondary | ICD-10-CM | POA: Diagnosis not present

## 2023-06-01 DIAGNOSIS — M25562 Pain in left knee: Secondary | ICD-10-CM | POA: Diagnosis not present

## 2023-06-01 DIAGNOSIS — M25662 Stiffness of left knee, not elsewhere classified: Secondary | ICD-10-CM | POA: Diagnosis not present

## 2023-06-03 DIAGNOSIS — M25662 Stiffness of left knee, not elsewhere classified: Secondary | ICD-10-CM | POA: Diagnosis not present

## 2023-06-03 DIAGNOSIS — M25562 Pain in left knee: Secondary | ICD-10-CM | POA: Diagnosis not present

## 2023-06-03 DIAGNOSIS — M180 Bilateral primary osteoarthritis of first carpometacarpal joints: Secondary | ICD-10-CM | POA: Diagnosis not present

## 2023-06-03 DIAGNOSIS — M19021 Primary osteoarthritis, right elbow: Secondary | ICD-10-CM | POA: Diagnosis not present

## 2023-06-06 DIAGNOSIS — M25662 Stiffness of left knee, not elsewhere classified: Secondary | ICD-10-CM | POA: Diagnosis not present

## 2023-06-06 DIAGNOSIS — M25562 Pain in left knee: Secondary | ICD-10-CM | POA: Diagnosis not present

## 2023-06-07 DIAGNOSIS — Z5189 Encounter for other specified aftercare: Secondary | ICD-10-CM | POA: Diagnosis not present

## 2023-06-08 DIAGNOSIS — N401 Enlarged prostate with lower urinary tract symptoms: Secondary | ICD-10-CM | POA: Diagnosis not present

## 2023-06-08 DIAGNOSIS — N281 Cyst of kidney, acquired: Secondary | ICD-10-CM | POA: Diagnosis not present

## 2023-06-08 DIAGNOSIS — N200 Calculus of kidney: Secondary | ICD-10-CM | POA: Diagnosis not present

## 2023-06-08 DIAGNOSIS — R3915 Urgency of urination: Secondary | ICD-10-CM | POA: Diagnosis not present

## 2023-06-10 DIAGNOSIS — M25562 Pain in left knee: Secondary | ICD-10-CM | POA: Diagnosis not present

## 2023-06-10 DIAGNOSIS — M25662 Stiffness of left knee, not elsewhere classified: Secondary | ICD-10-CM | POA: Diagnosis not present

## 2023-06-13 DIAGNOSIS — M25562 Pain in left knee: Secondary | ICD-10-CM | POA: Diagnosis not present

## 2023-06-13 DIAGNOSIS — K5730 Diverticulosis of large intestine without perforation or abscess without bleeding: Secondary | ICD-10-CM | POA: Diagnosis not present

## 2023-06-13 DIAGNOSIS — N210 Calculus in bladder: Secondary | ICD-10-CM | POA: Diagnosis not present

## 2023-06-13 DIAGNOSIS — N281 Cyst of kidney, acquired: Secondary | ICD-10-CM | POA: Diagnosis not present

## 2023-06-13 DIAGNOSIS — R310 Gross hematuria: Secondary | ICD-10-CM | POA: Diagnosis not present

## 2023-06-13 DIAGNOSIS — N200 Calculus of kidney: Secondary | ICD-10-CM | POA: Diagnosis not present

## 2023-06-13 DIAGNOSIS — M25662 Stiffness of left knee, not elsewhere classified: Secondary | ICD-10-CM | POA: Diagnosis not present

## 2023-06-15 DIAGNOSIS — R310 Gross hematuria: Secondary | ICD-10-CM | POA: Diagnosis not present

## 2023-06-15 DIAGNOSIS — R1084 Generalized abdominal pain: Secondary | ICD-10-CM | POA: Diagnosis not present

## 2023-06-15 DIAGNOSIS — N200 Calculus of kidney: Secondary | ICD-10-CM | POA: Diagnosis not present

## 2023-06-16 DIAGNOSIS — M25562 Pain in left knee: Secondary | ICD-10-CM | POA: Diagnosis not present

## 2023-06-16 DIAGNOSIS — M25662 Stiffness of left knee, not elsewhere classified: Secondary | ICD-10-CM | POA: Diagnosis not present

## 2023-06-20 DIAGNOSIS — M25562 Pain in left knee: Secondary | ICD-10-CM | POA: Diagnosis not present

## 2023-06-20 DIAGNOSIS — M25662 Stiffness of left knee, not elsewhere classified: Secondary | ICD-10-CM | POA: Diagnosis not present

## 2023-06-21 DIAGNOSIS — L84 Corns and callosities: Secondary | ICD-10-CM | POA: Diagnosis not present

## 2023-06-21 DIAGNOSIS — E1142 Type 2 diabetes mellitus with diabetic polyneuropathy: Secondary | ICD-10-CM | POA: Diagnosis not present

## 2023-06-21 DIAGNOSIS — B351 Tinea unguium: Secondary | ICD-10-CM | POA: Diagnosis not present

## 2023-06-21 DIAGNOSIS — M79676 Pain in unspecified toe(s): Secondary | ICD-10-CM | POA: Diagnosis not present

## 2023-06-23 ENCOUNTER — Telehealth: Payer: Self-pay

## 2023-06-23 ENCOUNTER — Other Ambulatory Visit: Payer: Self-pay | Admitting: Urology

## 2023-06-23 ENCOUNTER — Telehealth: Payer: Self-pay | Admitting: Cardiology

## 2023-06-23 DIAGNOSIS — M25662 Stiffness of left knee, not elsewhere classified: Secondary | ICD-10-CM | POA: Diagnosis not present

## 2023-06-23 DIAGNOSIS — M25562 Pain in left knee: Secondary | ICD-10-CM | POA: Diagnosis not present

## 2023-06-23 NOTE — Telephone Encounter (Signed)
   Pre-operative Risk Assessment    Patient Name: Brandon [name]  DOB: November 04, 1945 MRN: [id number]      Request for Surgical Clearance    Procedure:   Cystoscopy and bilateral ureteroscopy stents  Date of Surgery:  Clearance 07/12/23                                 Surgeon:  Dr. Bjorn Pippin Surgeon's Group or Practice Name:  [facility] Phone number:  [phone] Ext [phone] Fax number:  [phone]   Type of Clearance Requested:   - Medical  - Pharmacy:  Hold Aspirin needs be held 5 days   Type of Anesthesia:  General    Additional requests/questions:      Emmaline Kluver   06/23/2023, 12:13 PM

## 2023-06-23 NOTE — Telephone Encounter (Signed)
Patient scheduled for tele visit on 05/31/23. Med rec and consent done.

## 2023-06-23 NOTE — Telephone Encounter (Signed)
   Name: Brandon [name]  DOB: 11/17/1945  MRN: [id number]  Primary Cardiologist: Nona Dell, MD   Preoperative team, please contact this patient and set up a phone call appointment for further preoperative risk assessment. Please obtain consent and complete medication review. Thank you for your help.  I confirm that guidance regarding antiplatelet and oral anticoagulation therapy has been completed and, if necessary, noted below.  Patient can hold ASA 81 mg 5 to 7 days prior to procedure  I also confirmed the patient resides in the state of [place]. As per [facility] Medical Board telemedicine laws, the patient must reside in the state in which the provider is licensed.   Napoleon Form, Leodis Rains, NP 06/23/2023, 12:24 PM [facility]

## 2023-06-23 NOTE — Telephone Encounter (Signed)
  Patient Consent for Virtual Visit         Brandon [name] has provided verbal consent on 06/23/2023 for a virtual visit (video or telephone).   CONSENT FOR VIRTUAL VISIT FOR:  Brandon [name]  By participating in this virtual visit I agree to the following:  I hereby voluntarily request, consent and authorize [facility] and its employed or contracted physicians, physician assistants, nurse practitioners or other licensed health care professionals (the Practitioner), to provide me with telemedicine health care services (the "Services") as deemed necessary by the treating Practitioner. I acknowledge and consent to receive the Services by the Practitioner via telemedicine. I understand that the telemedicine visit will involve communicating with the Practitioner through live audiovisual communication technology and the disclosure of certain medical information by electronic transmission. I acknowledge that I have been given the opportunity to request an in-person assessment or other available alternative prior to the telemedicine visit and am voluntarily participating in the telemedicine visit.  I understand that I have the right to withhold or withdraw my consent to the use of telemedicine in the course of my care at any time, without affecting my right to future care or treatment, and that the Practitioner or I may terminate the telemedicine visit at any time. I understand that I have the right to inspect all information obtained and/or recorded in the course of the telemedicine visit and may receive copies of available information for a reasonable fee.  I understand that some of the potential risks of receiving the Services via telemedicine include:  Delay or interruption in medical evaluation due to technological equipment failure or disruption; Information transmitted may not be sufficient (e.g. poor resolution of images) to allow for appropriate medical decision making by the  Practitioner; and/or  In rare instances, security protocols could fail, causing a breach of personal health information.  Furthermore, I acknowledge that it is my responsibility to provide information about my medical history, conditions and care that is complete and accurate to the best of my ability. I acknowledge that Practitioner's advice, recommendations, and/or decision may be based on factors not within their control, such as incomplete or inaccurate data provided by me or distortions of diagnostic images or specimens that may result from electronic transmissions. I understand that the practice of medicine is not an exact science and that Practitioner makes no warranties or guarantees regarding treatment outcomes. I acknowledge that a copy of this consent can be made available to me via my patient portal [facility] MyChart), or I can request a printed copy by calling the office of  [facility].    I understand that my insurance will be billed for this visit.   I have read or had this consent read to me. I understand the contents of this consent, which adequately explains the benefits and risks of the Services being provided via telemedicine.  I have been provided ample opportunity to ask questions regarding this consent and the Services and have had my questions answered to my satisfaction. I give my informed consent for the services to be provided through the use of telemedicine in my medical care

## 2023-06-27 DIAGNOSIS — M25662 Stiffness of left knee, not elsewhere classified: Secondary | ICD-10-CM | POA: Diagnosis not present

## 2023-06-27 DIAGNOSIS — M25562 Pain in left knee: Secondary | ICD-10-CM | POA: Diagnosis not present

## 2023-06-30 DIAGNOSIS — M25562 Pain in left knee: Secondary | ICD-10-CM | POA: Diagnosis not present

## 2023-06-30 DIAGNOSIS — M25662 Stiffness of left knee, not elsewhere classified: Secondary | ICD-10-CM | POA: Diagnosis not present

## 2023-07-01 ENCOUNTER — Ambulatory Visit: Payer: Medicare Other | Attending: Internal Medicine | Admitting: Nurse Practitioner

## 2023-07-01 DIAGNOSIS — Z01810 Encounter for preprocedural cardiovascular examination: Secondary | ICD-10-CM

## 2023-07-01 NOTE — Progress Notes (Signed)
Virtual Visit via Telephone Note   Because of Brandon [name]'s co-morbid illnesses, he is at least at moderate risk for complications without adequate follow up.  This format is felt to be most appropriate for this patient at this time.  The patient did not have access to video technology/had technical difficulties with video requiring transitioning to audio format only (telephone).  All issues noted in this document were discussed and addressed.  No physical exam could be performed with this format.  Please refer to the patient's chart for his consent to telehealth for [facility].  Evaluation Performed:  Preoperative cardiovascular risk assessment _____________   Date:  07/01/2023   Patient ID:  Brandon [name], DOB Aug 10, 1946, MRN [id number] Patient Location:  Home Provider location:   Office  Primary Care Provider:  Assunta Found, MD Primary Cardiologist:  Nona Dell, MD  Chief Complaint / Patient Profile   77 y.o. y/o male with a h/o CAD s/p CABG x 4 (LIMA-LAD, SVG-PDA, SVG-ramus intermedius and OM) in 2015, hypertension, hyperlipidemia, myasthenia gravis, and GERD who is pending cystoscopy and bilateral ureteroscopy stents on 07/12/2023 with Dr. Bjorn Pippin of [facility] and presents today for telephonic preoperative cardiovascular risk assessment.  History of Present Illness    Brandon [name] is a 77 y.o. male who presents via audio/video conferencing for a telehealth visit today.  His wife is also present by phone (DPR on file).  Pt was last seen in cardiology clinic on 01/26/2023 by Dr. Diona Browner.  At that time Brandon [name] was doing well.  The patient is now pending procedure as outlined above. Since his last visit, he has done well from a cardiac standpoint.   He denies chest pain, palpitations, dyspnea, pnd, orthopnea, n, v, dizziness, syncope, edema, weight gain, or early satiety. All other systems reviewed and are otherwise negative except as noted  above.   Past Medical History    Past Medical History:  Diagnosis Date   Arthritis    Coronary atherosclerosis of native coronary artery    Multivessel status post CABG March 2015   DDD (degenerative disc disease), lumbar    Essential hypertension    GERD (gastroesophageal reflux disease)    Hard of hearing both ears    Left ear is worst   Hemorrhoids    History of gout    History of kidney stones    history of myasthenia Gravis 2021   History of [facility] spotted fever    Left ureteral  & renal stone    Lumbago    Mixed hyperlipidemia    Personal history of COVID-19 08/10/2021   Paxlovid   Plantar fascial fibromatosis    Postoperative atrial fibrillation (HCC)    Skin Cancer (HCC)    Use of cane as ambulatory aid    Uses roller walker    UTI (urinary tract infection) 09/16/2021   Wears glasses    Past Surgical History:  Procedure Laterality Date   AMPUTATION Right 03/27/2014   Procedure: DEBRIDEMENT AND CLOSURE RIGHT INDEX FINGER;  Surgeon: Sharma Covert, MD;  Location: [facility] OR;  Service: Orthopedics;  Laterality: Right;   BACK SURGERY     lower not sure when per wife on 10-01-2021   BACK SURGERY     BICEPT TENODESIS  11/26/2011   Procedure: BICEPT TENODESIS;  Surgeon: Verlee Rossetti, MD;  Location: [facility] OR;  Service: Orthopedics;  Laterality: Left;   BIOPSY  08/03/2018   Procedure: BIOPSY;  Surgeon:  Malissa Hippo, MD;  Location: [facility] SUITE;  Service: Endoscopy;;  antral   Cataract surgery Bilateral    yrs ago per wife on 10-01-2021   CHOLECYSTECTOMY N/A 09/22/2018   Procedure: LAPAROSCOPIC CHOLECYSTECTOMY;  Surgeon: Lucretia Roers, MD;  Location: [facility] ORS;  Service: General;  Laterality: N/A;   COLONOSCOPY N/A 12/26/2014   Procedure: COLONOSCOPY;  Surgeon: Malissa Hippo, MD;  [facility]: [facility];  Service: Endoscopy;  Laterality: N/A;  730   CORONARY ARTERY BYPASS GRAFT N/A 10/19/2013   Procedure: CORONARY ARTERY BYPASS GRAFTING (CABG);  Surgeon:  Purcell Nails, MD;  Location: [facility] OR;  Service: Open Heart Surgery;  Laterality: N/A;  CABG times four using left internal mammary artery and left leg saphenous vein, incision made on right leg but no vein removed   CYSTOSCOPY W/ URETERAL STENT PLACEMENT Right 02/22/2022   Procedure: CYSTOSCOPY WITH RETROGRADE PYELOGRAM/URETERAL STENT PLACEMENT;  Surgeon: Rene Paci, MD;  Location: [facility] ORS;  Service: Urology;  Laterality: Right;   CYSTOSCOPY WITH RETROGRADE PYELOGRAM, URETEROSCOPY AND STENT PLACEMENT Left 08/24/2016   Procedure: CYSTOSCOPY WITH LEFT RETROGRADE PYELOGRAM, LEFT URETEROSCOPY, BASKET EXTRACTION LEFT URETERAL STONE;  Surgeon: Bjorn Pippin, MD;  Location: [facility] ORS;  Service: Urology;  Laterality: Left;   CYSTOSCOPY/URETEROSCOPY/HOLMIUM LASER/STENT PLACEMENT Left 10/06/2021   Procedure: CYSTOSCOPY LEFT RETROGRADE PYELOGRAM URETEROSCOPY/HOLMIUM LASER/STENT PLACEMENT;  Surgeon: Bjorn Pippin, MD;  Location: [facility];  Service: Urology;  Laterality: Left;   CYSTOSCOPY/URETEROSCOPY/HOLMIUM LASER/STENT PLACEMENT Right 03/01/2022   Procedure: CYSTOSCOPY RIGHT URETEROSCOPY/HOLMIUM LASER/STENT PLACEMENT;  Surgeon: Bjorn Pippin, MD;  Location: [facility];  Service: Urology;  Laterality: Right;   ESOPHAGOGASTRODUODENOSCOPY N/A 08/03/2018   Procedure: ESOPHAGOGASTRODUODENOSCOPY (EGD);  Surgeon: Malissa Hippo, MD;  [facility];  Service: Endoscopy;  Laterality: N/A;  2:55   EXTRACORPOREAL SHOCK WAVE LITHOTRIPSY Right 06/18/2019   Procedure: EXTRACORPOREAL SHOCK WAVE LITHOTRIPSY (ESWL);  Surgeon: Crista Elliot, MD;  Location: [facility]S;  Service: Urology;  Laterality: Right;   INTRAOPERATIVE TRANSESOPHAGEAL ECHOCARDIOGRAM N/A 10/19/2013   Procedure: INTRAOPERATIVE TRANSESOPHAGEAL ECHOCARDIOGRAM;  Surgeon: Purcell Nails, MD;  Location: [facility] OR;  Service: Open Heart Surgery;  Laterality: N/A;   KYPHOPLASTY N/A 03/27/2021   Procedure: Lumbar One and Lumbar Two  Kyphoplasty;  Surgeon: Maeola Harman, MD;  Location: [facility] OR;  Service: Neurosurgery;  Laterality: N/A;   left hand carpal tunnel release  04/04/2015   LEFT HEART CATHETERIZATION WITH CORONARY ANGIOGRAM N/A 10/09/2013   Procedure: LEFT HEART CATHETERIZATION WITH CORONARY ANGIOGRAM;  Surgeon: Kathleene Hazel, MD;  Location: [facility] CATH LAB;  Service: Cardiovascular;  Laterality: N/A;   Left shoulder rotator cuff repair  12/04/2011   MOHS procedure  01/06/2021   dr Modena Jansky on left hand   ORIF PERIPROSTHETIC FRACTURE Left 03/19/2020   Procedure: OPEN REDUCTION INTERNAL FIXATION (ORIF) PERIPROSTHETIC FRACTURE LEFT FEMUR WITH FEMORAL REVISION;  Surgeon: Ollen Gross, MD;  Location: [facility]S;  Service: Orthopedics;  Laterality: Left;   REVERSE SHOULDER ARTHROPLASTY Right 10/08/2022   Procedure: REVERSE SHOULDER ARTHROPLASTY;  Surgeon: Beverely Low, MD;  Location: [facility];  Service: Orthopedics;  Laterality: Right;  general, choice with interscalene block   TOTAL HIP ARTHROPLASTY Left 01/28/2020   Procedure: TOTAL HIP ARTHROPLASTY ANTERIOR APPROACH;  Surgeon: Ollen Gross, MD;  Location: [facility];  Service: Orthopedics;  Laterality: Left;    TOTAL KNEE ARTHROPLASTY Left 04/18/2001   TOTAL KNEE ARTHROPLASTY Right 05/02/2023   Procedure: TOTAL KNEE ARTHROPLASTY;  Surgeon: Ollen Gross, MD;  Location: [facility];  Service: Orthopedics;  Laterality: Right;   traumaticvamputation of finger  2015   VASECTOMY     yrs ago per wife on 10-01-2021    Allergies  Allergies  Allergen Reactions   Gabapentin Other (See Comments)    Hallucinations    Home Medications    Prior to Admission medications   Medication Sig Start Date End Date Taking? Authorizing Provider  allopurinol (ZYLOPRIM) 300 MG tablet Take 150 mg by mouth in the morning.    [provider]  amLODipine (NORVASC) 5 MG tablet Take 1 tablet (5 mg total) by mouth daily. 11/11/19   Burnadette Pop, MD  Ascorbic Acid (VITAMIN  C) 1000 MG tablet Take 1,000 mg by mouth in the morning.    [provider]  Calcium Carb-Cholecalciferol (CALCIUM 600+D3 PO) Take 1 tablet by mouth in the morning and at bedtime.    [provider]  chlorhexidine (HIBICLENS) 4 % external liquid Apply 15 mLs (1 Application total) topically as directed for 30 doses. Use as directed daily for 5 days every other week for 6 weeks. 05/02/23   Eartha Inch, PA  clotrimazole-betamethasone (LOTRISONE) cream Apply 1 Application topically 2 (two) times daily as needed (rash).    [provider]  methocarbamol (ROBAXIN) 500 MG tablet Take 1 tablet (500 mg total) by mouth every 6 (six) hours as needed for muscle spasms. 05/03/23   Edmisten, Lyn Hollingshead, PA  metoprolol tartrate (LOPRESSOR) 25 MG tablet Take 1/2 (one-half) tablet by mouth twice daily 04/15/23   Jonelle Sidle, MD  Misc Natural Products (OSTEO BI-FLEX ADV TRIPLE ST) TABS Take 1 tablet by mouth in the morning.    [provider]  Multiple Vitamin (MULTIVITAMIN) tablet Take 1 tablet by mouth daily.    [provider]  nitroGLYCERIN (NITROSTAT) 0.4 MG SL tablet Place 0.4 mg under the tongue every 5 (five) minutes as needed for chest pain.    [provider]  Omega-3 Fatty Acids (FISH OIL) 500 MG CAPS Take 500 mg by mouth in the morning.    [provider]  ondansetron (ZOFRAN) 4 MG tablet Take 1 tablet (4 mg total) by mouth every 6 (six) hours as needed for nausea. 05/03/23   Edmisten, Kristie L, PA  oxyCODONE (OXY IR/ROXICODONE) 5 MG immediate release tablet Take 1-2 tablets (5-10 mg total) by mouth every 8 (eight) hours as needed for severe pain. 05/03/23   Edmisten, Kristie L, PA  pantoprazole (PROTONIX) 40 MG tablet Take 40 mg by mouth daily. 09/10/22   [provider]  pravastatin (PRAVACHOL) 40 MG tablet Take 40 mg by mouth every evening.     [provider]  pyridostigmine (MESTINON) 60 MG tablet Take 0.5 tablets  (30 mg total) by mouth 3 (three) times daily. Patient taking differently: Take 30 mg by mouth in the morning and at bedtime. 09/16/22 12/10/23  Penumalli, Glenford Bayley, MD  tamsulosin (FLOMAX) 0.4 MG CAPS capsule Take 1 capsule by mouth daily.    [provider]  traMADol (ULTRAM) 50 MG tablet Take 1-2 tablets (50-100 mg total) by mouth every 6 (six) hours as needed for moderate pain. 05/03/23   Derenda Fennel, PA    Physical Exam    Vital Signs:  ANJAY KOKAL does not have vital signs available for review today.  Given telephonic nature of communication, physical exam is limited. AAOx3. NAD. Normal affect.  Speech and respirations are unlabored.  Accessory Clinical Findings  None  Assessment & Plan    1.  Preoperative Cardiovascular Risk Assessment:  According to the Revised Cardiac Risk Index (RCRI), his Perioperative Risk of Major Cardiac Event is (%): 0.9. His Functional Capacity in METs is: 5.62 according to the [facility] Activity Status Index (DASI). Therefore, based on ACC/AHA guidelines, patient would be at acceptable risk for the planned procedure without further cardiovascular testing.   The patient was advised that if he develops new symptoms prior to surgery to contact our office to arrange for a follow-up visit, and he verbalized understanding.  Per office protocol, he may hold Aspirin for 5-7 days prior to procedure. Please resume Aspirin as soon as possible postprocedure, at the discretion of the surgeon.   A copy of this note will be routed to requesting surgeon.  Time:   Today, I have spent 7 minutes with the patient with telehealth technology discussing medical history, symptoms, and management plan.     Joylene Grapes, NP  07/01/2023, 11:16 AM

## 2023-07-04 DIAGNOSIS — M25562 Pain in left knee: Secondary | ICD-10-CM | POA: Diagnosis not present

## 2023-07-04 DIAGNOSIS — M25662 Stiffness of left knee, not elsewhere classified: Secondary | ICD-10-CM | POA: Diagnosis not present

## 2023-07-05 ENCOUNTER — Encounter (HOSPITAL_COMMUNITY): Payer: Self-pay

## 2023-07-05 NOTE — Patient Instructions (Addendum)
SURGICAL WAITING ROOM VISITATION Patients having surgery or a procedure may have no more than 2 support people in the waiting area - these visitors may rotate.    Children under the age of 60 must have an adult with them who is not the patient.  If the patient needs to stay at the hospital during part of their recovery, the visitor guidelines for inpatient rooms apply. Pre-op nurse will coordinate an appropriate time for 1 support person to accompany patient in pre-op.  This support person may not rotate.    Please refer to the [facility] website for the visitor guidelines for Inpatients (after your surgery is over and you are in a regular room).       Your procedure is scheduled on: 07-12-23   Report to [facility] Main Entrance    Report to admitting at 6:45 AM   Call this number if you have problems the morning of surgery [phone]   Do not eat food or drink liquids :After Midnight.           If you have questions, please contact your surgeon's office.   FOLLOW  ANY ADDITIONAL PRE OP INSTRUCTIONS YOU RECEIVED FROM YOUR SURGEON'S OFFICE!!!     Oral Hygiene is also important to reduce your risk of infection.                                    Remember - BRUSH YOUR TEETH THE MORNING OF SURGERY WITH YOUR REGULAR TOOTHPASTE   Do NOT smoke after Midnight   Take these medicines the morning of surgery with A SIP OF WATER:   Allopurinol  Amlodipine  Metoprolol  Pantoprazole  Pyridostigmine  Tamsulosin  Tramadol if needed  Stop all vitamins and herbal supplements 7 days before surgery  Bring CPAP mask and tubing day of surgery.                              You may not have any metal on your body including  jewelry, and body piercing             Do not wear lotions, powders, cologne, or deodorant              Men may shave face and neck.   Do not bring valuables to the hospital. [facility] IS NOT RESPONSIBLE   FOR VALUABLES.   Contacts, dentures or  bridgework may not be worn into surgery.  DO NOT BRING YOUR HOME MEDICATIONS TO THE HOSPITAL. PHARMACY WILL DISPENSE MEDICATIONS LISTED ON YOUR MEDICATION LIST TO YOU DURING YOUR ADMISSION IN THE HOSPITAL!    Patients discharged on the day of surgery will not be allowed to drive home.  Someone NEEDS to stay with you for the first 24 hours after anesthesia.   Special Instructions: Bring a copy of your healthcare power of attorney and living will documents the day of surgery if you haven't scanned them before.              Please read over the following fact sheets you were given: IF YOU HAVE QUESTIONS ABOUT YOUR PRE-OP INSTRUCTIONS PLEASE CALL [phone] [place]  If you received a COVID test during your pre-op visit  it is requested that you wear a mask when out in public, stay away from anyone that may not be feeling well and notify your  surgeon if you develop symptoms. If you test positive for Covid or have been in contact with anyone that has tested positive in the last 10 days please notify you surgeon.  [facility] - Preparing for Surgery Before surgery, you can play an important role.  Because skin is not sterile, your skin needs to be as free of germs as possible.  You can reduce the number of germs on your skin by washing with CHG (chlorahexidine gluconate) soap before surgery.  CHG is an antiseptic cleaner which kills germs and bonds with the skin to continue killing germs even after washing. Please DO NOT use if you have an allergy to CHG or antibacterial soaps.  If your skin becomes reddened/irritated stop using the CHG and inform your nurse when you arrive at Short Stay. Do not shave (including legs and underarms) for at least 48 hours prior to the first CHG shower.  You may shave your face/neck.  Please follow these instructions carefully:  1.  Shower with CHG Soap the night before surgery and the  morning of surgery.  2.  If you choose to wash your hair, wash your hair first as usual  with your normal  shampoo.  3.  After you shampoo, rinse your hair and body thoroughly to remove the shampoo.                             4.  Use CHG as you would any other liquid soap.  You can apply chg directly to the skin and wash.  Gently with a scrungie or clean washcloth.  5.  Apply the CHG Soap to your body ONLY FROM THE NECK DOWN.   Do   not use on face/ open                           Wound or open sores. Avoid contact with eyes, ears mouth and   genitals (private parts).                       Wash face,  Genitals (private parts) with your normal soap.             6.  Wash thoroughly, paying special attention to the area where your    surgery  will be performed.  7.  Thoroughly rinse your body with warm water from the neck down.  8.  DO NOT shower/wash with your normal soap after using and rinsing off the CHG Soap.                9.  Pat yourself dry with a clean towel.            10.  Wear clean pajamas.            11.  Place clean sheets on your bed the night of your first shower and do not  sleep with pets. Day of Surgery : Do not apply any lotions/deodorants the morning of surgery.  Please wear clean clothes to the hospital/surgery center.  FAILURE TO FOLLOW THESE INSTRUCTIONS MAY RESULT IN THE CANCELLATION OF YOUR SURGERY  PATIENT SIGNATURE_________________________________  NURSE SIGNATURE__________________________________  ________________________________________________________________________

## 2023-07-05 NOTE — Progress Notes (Signed)
COVID Vaccine Completed:  Yes  Date of COVID positive in last 90 days:  PCP - Assunta Found, MD Cardiologist - Nona Dell, MD  Cardiac clearance in Epic dated 07-01-23 by Bernadene Person, NP  Chest x-ray -  EKG - 01-26-23 Epic Stress Test - 2-20-+23 Epic ECHO - 03-20-20 Epic Cardiac Cath -  Pacemaker/ICD device last checked: Spinal Cord Stimulator:  Bowel Prep -   Sleep Study -  CPAP -   Fasting Blood Sugar -  Checks Blood Sugar _____ times a day  Last dose of GLP1 agonist-  N/A GLP1 instructions:  Hold 7 days before surgery    Last dose of SGLT-2 inhibitors-  N/A SGLT-2 instructions:  Hold 3 days before surgery    Blood Thinner Instructions:  Time Aspirin Instructions:  ASA 81.  Hold 5-7 days Last Dose:  Activity level:  Can go up a flight of stairs and perform activities of daily living without stopping and without symptoms of chest pain or shortness of breath.  Able to exercise without symptoms  Unable to go up a flight of stairs without symptoms of     Anesthesia review:  Afib, hx of CABG,  HTN, myasthenia gravis  Patient denies shortness of breath, fever, cough and chest pain at PAT appointment  Patient verbalized understanding of instructions that were given to them at the PAT appointment. Patient was also instructed that they will need to review over the PAT instructions again at home before surgery.

## 2023-07-07 ENCOUNTER — Encounter (HOSPITAL_COMMUNITY): Payer: Self-pay

## 2023-07-07 ENCOUNTER — Encounter (HOSPITAL_COMMUNITY)
Admission: RE | Admit: 2023-07-07 | Discharge: 2023-07-07 | Disposition: A | Discharge status: Home / Self Care | Payer: Medicare Other | Source: Ambulatory Visit | Attending: Urology | Admitting: Urology

## 2023-07-07 ENCOUNTER — Other Ambulatory Visit: Payer: Self-pay

## 2023-07-07 VITALS — BP 144/85 | HR 54 | Temp 98.2°F | Resp 16 | Ht 73.0 in | Wt 180.2 lb

## 2023-07-07 DIAGNOSIS — Z87891 Personal history of nicotine dependence: Secondary | ICD-10-CM | POA: Diagnosis not present

## 2023-07-07 DIAGNOSIS — I10 Essential (primary) hypertension: Secondary | ICD-10-CM | POA: Diagnosis not present

## 2023-07-07 DIAGNOSIS — M51369 Other intervertebral disc degeneration, lumbar region without mention of lumbar back pain or lower extremity pain: Secondary | ICD-10-CM | POA: Insufficient documentation

## 2023-07-07 DIAGNOSIS — M25562 Pain in left knee: Secondary | ICD-10-CM | POA: Diagnosis not present

## 2023-07-07 DIAGNOSIS — Z951 Presence of aortocoronary bypass graft: Secondary | ICD-10-CM | POA: Diagnosis not present

## 2023-07-07 DIAGNOSIS — M1990 Unspecified osteoarthritis, unspecified site: Secondary | ICD-10-CM | POA: Diagnosis not present

## 2023-07-07 DIAGNOSIS — R319 Hematuria, unspecified: Secondary | ICD-10-CM | POA: Diagnosis not present

## 2023-07-07 DIAGNOSIS — Z01812 Encounter for preprocedural laboratory examination: Secondary | ICD-10-CM | POA: Insufficient documentation

## 2023-07-07 DIAGNOSIS — K219 Gastro-esophageal reflux disease without esophagitis: Secondary | ICD-10-CM | POA: Diagnosis not present

## 2023-07-07 DIAGNOSIS — M25662 Stiffness of left knee, not elsewhere classified: Secondary | ICD-10-CM | POA: Diagnosis not present

## 2023-07-07 DIAGNOSIS — D649 Anemia, unspecified: Secondary | ICD-10-CM | POA: Insufficient documentation

## 2023-07-07 DIAGNOSIS — G7000 Myasthenia gravis without (acute) exacerbation: Secondary | ICD-10-CM | POA: Diagnosis not present

## 2023-07-07 DIAGNOSIS — Z01818 Encounter for other preprocedural examination: Secondary | ICD-10-CM

## 2023-07-07 HISTORY — DX: Anemia, unspecified: D64.9

## 2023-07-07 LAB — CBC
HCT: 42.6 % (ref 39.0–52.0)
Hemoglobin: 13.2 g/dL (ref 13.0–17.0)
MCH: 28.8 pg (ref 26.0–34.0)
MCHC: 31 g/dL (ref 30.0–36.0)
MCV: 92.8 fL (ref 80.0–100.0)
Platelets: 229 10*3/uL (ref 150–400)
RBC: 4.59 MIL/uL (ref 4.22–5.81)
RDW: 14 % (ref 11.5–15.5)
WBC: 6.2 10*3/uL (ref 4.0–10.5)
nRBC: 0 % (ref 0.0–0.2)

## 2023-07-07 LAB — BASIC METABOLIC PANEL
Anion gap: 6 (ref 5–15)
BUN: 13 mg/dL (ref 8–23)
CO2: 25 mmol/L (ref 22–32)
Calcium: 9.3 mg/dL (ref 8.9–10.3)
Chloride: 105 mmol/L (ref 98–111)
Creatinine, Ser: 0.92 mg/dL (ref 0.61–1.24)
GFR, Estimated: >60 mL/min (ref >60)
Glucose, Bld: 104 mg/dL — ABNORMAL HIGH (ref 70–99)
Potassium: 3.8 mmol/L (ref 3.5–5.1)
Sodium: 136 mmol/L (ref 135–145)

## 2023-07-08 LAB — SURGICAL PCR SCREEN
MRSA, PCR: POSITIVE — AB
Staphylococcus aureus: POSITIVE — AB

## 2023-07-08 NOTE — Progress Notes (Signed)
PCR results sent to Dr. Bjorn Pippin for review.

## 2023-07-11 ENCOUNTER — Encounter (HOSPITAL_COMMUNITY): Payer: Self-pay

## 2023-07-11 NOTE — H&P (Signed)
On 707/05/2022:  -The patient presents today with a 24 hour history of low back pain with radiation into the left inguinal region and is associated with gross hematuria  -He denies N/V/F/C or dysuria  -AFVSS  -UA today shows sign of cystitis   03/15/2022: Patient underwent urgent stenting on 7/10 for an obstructing/symptomatic 6 mm righ proximal ureteral stone. Also received concurrent treatment for E. coli UTI. Taken for definitive ureteroscopy on 7/17 with his primary urologist. Review of the operative note indicates stone was appropriately treated. A ureteral stent was reinserted at the conclusion of the procedure. When the patient arrived in the PACU, it was noted that his stent had become dislodged with approximately 2 to 3 inches exuding from the urethral meatus. Because of this, Dr. Annabell Howells went ahead and remove the stent rest of the way citing previous stent placement resulting in appropriate renal dilation and minimal residual stone material left following ureteroscopy is indication for not taking him back to the operating room for replacement. Patient returns now for follow-up exam.   Here today with his wife. Overall doing well and recovering appropriately. He is no longer having any pain or discomfort suggestive of obstructive uropathy. He is not having any dysuria or gross hematuria. Following the procedure he did have some passage of sand-like stone material which was expected but occurred without any bothersome increase in pain/discomfort or disruption in baseline lower urinary tract symptoms. He denies any interval fevers or chills, nausea/vomiting.   04/15/2022: Patient seen today as an acute visit. Over the past weekend he developed left lower back and flank pain/discomfort. Also associated with increased urinary frequency/urgency and dysuria. He passed some gross hematuria and 2 small stones a couple of nights ago. Symptoms have been gradually improving since then. Still having urgency but  flow has improved, no longer having dysuria or left-sided pain or discomfort. He has not seen any additional hematuria. UA today appears infected with pyuria and bacteria noted. Patient no longer on any type of alpha-blocker therapy.   06/09/22: Dekwan returns today in f/u. He had ureteroscopy for a right UPJ stone in July. He was seen in August with a UTI and a renal US then showed renal stones but no hydro. He had some recent right flank pain but that is better. He is having some dysuria at night and has nocturia x 5. His IPSS is 13. His UA today has a few bacteria. He has had no hematuria. He had some UUI about 2 weeks ago. He UA has some odor. A KUB today shows small renal stones but no obvious ureteral stones.   07/14/2022: Mr. Muffler returns for reassessment after trial of Leslye Peer provided at time of last office visit. Tolerating well. He feels like urgency and dysuria are better with the medication but he is still getting up and using a urinal multiple times per night completely filling a urinal by time of waking up. The nocturia has not improved. He does complain of a dry mouth and usually takes a bottle of water with him to bed and that is finished by morning time as well. Not having any new or worsening daytime symptoms, new or worsening trouble starting his stream. No interval hematuria or stone material passage. He is still complaining of some intermittent right flank and lower back pain but that does not occur every day. There was no hematuria on last UA and KUB was grossly reassuring. PVR today less than 1 ounce, UA without microscopic hematuria, grossly unchanged  from last UA with 1+ leukocyte and few bacteria.   08/30/2022: Mr. Valera returns today with his wife. He remains on [place]. Over the last week he has been having increased bilateral lower back and flank pain/discomfort as well as associated increased dysuria which is historically been a chronic finding for him. Also with some mixed  obstructive voiding symptoms especially at night. He thinks symptoms seem to worsen after he lies down. He is getting ready to have a right shoulder replacement sometime at the end of this month or early February. He has not seen any gross hematuria but thinks he passed some small stone pieces today when he provided a voided urine specimen which is grossly reassuring but he does have a past history of UTIs as well. His DRE today was grossly unremarkable.   11/22/22: Jaren returns today in f/u for his history of stones, renal cysts and OAB. He has some chronic low back pain. He had right shoulder surgery done recently and has to have a knee surgery some time soon. He had an MRI of the abdomen in 2/24 that showed bilateral renal cysts that are B1 and B2. He has BPH with BOO and is on tamsulosin but still has frequency and nocturia 3-4x. He has a reduced stream. He has had no hematuira but he has had some dysuria. He was on Gemtesa but that didn't help but he remains on it. He has 2 cups of coffee in the morning, some tea but no diet drinks. He has a history of UTI's and has 6-10 WBC and a few bacteria today, which is not what his urine generally looks like with an infection.   02/21/2023: Rhys is now receiving PTNS. He has had 7 treatments so far. He remains on tamsulosin from Dr. Annabell Howells. Back today for follow-up exam. IPSS is 11. Subjectively somewhat difficult to tell if he is beginning to see some improvement, his wife thinks he is going less but patient thinks that it remains unchanged. Still getting up 4 more times per night. Both agree his urinary urgency is improved. Patient states he may have passed a stone a couple weeks ago because he had some right flank pain but that resolved. He has not had any interval dysuria or gross hematuria.   04/06/23: Redell returns today in f/u. He has been on PTNS but hasn't had much improvement in the nocturia. He has daytime urgency. At night he has large volume voids. He  remains on tamsulosin. he failed Gemtesa.   05/25/2023: Presents today for an acute visit. He underwent recent urodynamics and has follow-up in a couple of weeks with an ultrasound and to go over results with Dr. Annabell Howells. He has been having worsening back pain and some increased difficulty voiding with subjective less than optimal urine output. He had a recent manage placement has been taking pain medication for that. There has been some associated constipation but his wife has him take MiraLAX daily. He remains on tamsulosin.   Current symptoms began after urodynamics last Monday. He is describing bilateral lower back pain but no radiation to the flank or lower abdomen. Associated with increased urinary urgency, increased hesitancy and straining to void with subjectively decreased urine output. Also describing more severe pain and burning with urination than what he is previously accustomed to. He has not had gross hematuria. He denies fevers or chills. Symptoms have been associated with nausea but no vomiting. He continues to have knee pain from his recent right knee replacement.  PVR today reassuring, no evidence of retention.   06/08/23: Raymund returns today in f/u. He continues to havve back pain. The renal US today shows no hydro but bilateral cysts and possible small stones. The KUB shows no obvious stones but he ahs severe lumbar DDD with lower lumbar hardware and kyphoplasties above that. He has persistent pyuria but his culture was negative. He thinks he may have passed a few particles.   He had urodynamics in September for OAB symptoms.   UDS SUMMARY  Mr. Steadman held a max capacity of approx. 400 mls. His 1st sensation was felt at 215 mls. There was positive instability. A few unstable contractions noted on the study. He felt a strong urgency and leaked a mild amount from this bladder instability.He was able to generate a voluntary contraction and void 198 mls with max flow of 11 ml/s. Max detrusor  pressure while voiding was 44 cmH20.Intermittent increased EMG activity was noted during voiding. PVR was approx. 59 mls. Some elevation of the bladder base was noted. No reflux was seen. .   06/13/23: Mr. Chuang returns today in f/u. He has been having hematuria intermittently for the last several days with the last this morning. He has some right flank pain with some nausea. The UA today ahs 6-10 WBC and a few bacteria. He has some dysuria.   06/15/23: Chord returns today in f/u for cystoscopy. He had a little bit of blood this morning and has 20-40 RBC's on UA. HE continues to have right flank pain.     ALLERGIES: Gabapentin    MEDICATIONS: Metoprolol Tartrate 25 mg tablet  Tamsulosin Hcl 0.4 mg capsule 1 capsule PO Daily  Tamsulosin Hcl 0.4 mg capsule 1 capsule PO BID  Allopurinol 300 mg tablet Oral  Amlodipine Besilate  Aspirin Ec 81 mg tablet, delayed release  Calcium  Fish Oil CAPS Oral  Hydrocodone-Acetaminophen  Multi-Vitamin TABS Oral  Nitroglycerin 0.4 mg tablet, sublingual  Osteo Bi-Flex Regular Strength TABS Oral  Pantoprazole Sodium  Pravachol 40 MG Oral Tablet Oral  Pyridostigmine Bromide Er  Vitamin C 500 mg tablet Oral     GU PSH: Complex cystometrogram, w/ void pressure and urethral pressure profile studies, any technique - 05/16/2023 Complex Uroflow - 05/16/2023 Cysto Bladder Stone <2.5cm - 2023 Cysto Remove Stent FB Sim - 2023 Cystoscopy Insert Stent - 02/22/2022 Cystoscopy Ureteroscopy - 2015 Emg surf Electrd - 05/16/2023 ESWL, Right - 2020, 2010 Inject For cystogram - 05/16/2023 Intrabd voidng Press - 05/16/2023 Ureteroscopic laser litho - 03/01/2022, 2023 Ureteroscopic stone removal, Left - 2018       PSH Notes: Cystoscopy With Ureteroscopy, Hand Surgery, CABG (CABG), Knee Replacement, Lithotripsy   NON-GU PSH: Back Surgery (Unspecified) CABG (coronary artery bypass grafting) - 2015 Carpal Tunnel Surgery.. Hip Replacement, Left - 2021 Neuroeltrd Stim  Post Tibial - 03/24/2023, 03/17/2023, 03/10/2023, 03/03/2023, 02/24/2023, 02/18/2023, 02/08/2023, 02/01/2023, 01/25/2023, 01/18/2023, 01/11/2023, 01/04/2023 Revise Knee Joint - 2015 Visit Complexity (formerly GPC1X) - 06/13/2023, 06/08/2023, 05/25/2023, 02/21/2023, 11/22/2022     GU PMH: Bladder Stone - 06/13/2023 Flank Pain (Stable) - 06/13/2023, - 07/14/2022, - 02/22/2022 Gross hematuria, He has had intermittent hematuria and could have passed a stone since the urine has cleared. There appear to possibly several stones in the bladder but the image quality is compromised by beam hardening. I will have him return for cystoscopy in 2 days. Culture obtained today. - 06/13/2023, - 02/22/2022, - 2023, - 2023, - 2020 Renal calculus (Stable) - 06/13/2023, There a possibly small  renal stones without obstruction on RUS. No treatmnet needed. , - 06/08/2023, He has small renal stones but had no recurrent bladder stones on his last CT. He has extensive prostatic stones. , - 04/06/2023, - 08/30/2022, No ureteral stones seen. , - 06/09/2022, - 04/15/2022, He has bilateral non-obstructing renal stones. I will have him return in 6 months with a KUB. , - 2023, - 2021 (Stable), He has stable small stones. I will have him return in a year with a KUB., - 2019, Bilateral kidney stones, - 2015 BPH w/LUTS, He has BPH with BOO with OAB wet with DI on urodynamics. I discussed options and will see if increasing the tamsulosin to BID will help. He has f/u on 11/13 to reassess. - 06/08/2023, He has not responded well to PTNS or Gemtesa. I will get him set up for urodynamics to further assess his voiding symtpoms., - 04/06/2023, - 02/21/2023, He is not sure the Leslye Peer helped so he will stop that but continue the tamsulosin. , - 11/22/2022, - 07/14/2022, He has worsening urgency and nocturia with some UUI and dysuria. I will get a culture since he has some bacteriuria and prior UTI's and I will give him Gemtesa samples to try. Side effects and instructions  reviewed. f/u in 4-6 weeks for a PVR. , - 06/09/2022, He is doing well after the stone removal. I will have him try to cut back the tamsulosin. , - 2023 (Improving), - 2018 Low back pain (Stable), HE has persistent back pain that doesn't appear to be renal and I have recommended he consider a NSAID if possible and see his PCP if it persists. - 06/08/2023, This is more consistent with musculoskeletal pain. , - 11/22/2022, - 08/30/2022, Left-sided low back pain without sciatica, - 2015 Nocturia (Stable) - 06/08/2023, (Stable), - 04/06/2023, - 02/21/2023, - 11/22/2022, - 06/09/2022, - 2023, - 2019 Renal cyst, He has bilateral simple cysts that don't need treatment. - 06/08/2023, - 02/21/2023, - 11/22/2022, - 04/15/2022, Bilateral simple cysts. , - 2023, - 2021, Renal cyst, acquired, - 2014 Urge incontinence - 06/08/2023, - 05/16/2023, - 04/06/2023, - 03/24/2023, - 03/17/2023, - 03/10/2023, - 03/03/2023, - 02/24/2023, - 02/18/2023, - 02/08/2023, - 02/01/2023, - 01/25/2023, - 01/18/2023, - 01/11/2023, - 01/04/2023, - 11/22/2022, - 07/14/2022, - 06/09/2022 Urinary Urgency - 06/08/2023, - 05/16/2023, - 04/06/2023, - 03/24/2023, - 03/17/2023, - 03/10/2023, - 03/03/2023, - 02/24/2023, - 02/21/2023, - 02/18/2023, - 02/08/2023, - 02/01/2023, - 01/25/2023, - 01/18/2023, - 01/11/2023, - 01/04/2023, - 07/14/2022 (Stable), - 06/09/2022, - 2021 Acute Cystitis/UTI (Stable) - 05/25/2023, - 04/15/2022, - 03/15/2022, - 02/22/2022, UA continues to appear infected despite being on appropriate antimicrobial treatment with cephalexin. Repeat urine culture sent today., - 2023, His UA looks infected but he has no fever or significant symptoms of concern. I will culture the urine and treat as needed but he was warned to contact us should he get a fever or more severe pain. , - 2023 Urinary Frequency - 05/16/2023, - 03/24/2023, - 03/17/2023, - 03/10/2023, - 03/03/2023, - 02/24/2023, - 02/18/2023, - 02/08/2023, - 02/01/2023, - 01/25/2023, - 01/18/2023, - 01/11/2023, - 01/04/2023, - 11/22/2022, - 2021 Personal  Hx Urinary Tract Infections, I will get a culture today. - 04/06/2023, - 02/21/2023, He has some pyuria and bacteria today with dysuria so I will get a culture. , - 11/22/2022 History of urolithiasis - 02/21/2023, Recent MRI showed benign cysts but no obstruction or obvious stones. , - 11/22/2022, Nephrolithiasis, - 2014 Dysuria - 08/30/2022, - 06/09/2022 Ureteral calculus (  Stable) - 03/15/2022, Right, - 2020, Left, - 2017, Calculus of ureter, - 2015 Renal and ureteral calculus - 02/22/2022, - 2023, - 2023, - 2023, He has left distal ureteral stones with obstruction and that is the likely cause of the hematuria and possibly the left hip pain. He is on tamsulosin and has hydrocodone. I will have him return in 2 weeks for a renal US. If he has not passed the stone, I will consider URS. , - 2023 (Improving), - 2018 Ureteral obstruction secondary to calculous - 2023 Bladder-neck stenosis/contracture, Bladder neck contracture - 2014      PMH Notes:  2009-01-16 10:23:44 - Note: Arthritis   NON-GU PMH: Bacteriuria, Urine culture today. - 2023 Encounter for general adult medical examination without abnormal findings, Encounter for preventive health examination - 2015 Personal history of other diseases of the musculoskeletal system and connective tissue, History of gout - 2015 Personal history of other diseases of the circulatory system, History of hypertension - 2014 Personal history of other diseases of the digestive system, History of esophageal reflux - 2014 Personal history of other endocrine, nutritional and metabolic disease, History of hypercholesterolemia - 2014    FAMILY HISTORY: Cardiac Failure - Mother Death In The Family Father - Father Death In The Family Mother - Mother Family Health Status Number - Runs In Family Heart Disease - Runs In Family Hypertension - Runs In Family Lung Cancer - Father nephrolithiasis - Runs In Family   SOCIAL HISTORY: Marital Status: Married Preferred Language:  English; Ethnicity: Not Hispanic Or Latino; Race: White Current Smoking Status: Patient does not smoke anymore.  Social Drinker.  Drinks 2 caffeinated drinks per day. 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     Notes: Occupation:, Caffeine Use, Marital History - Currently Married, Previous History Of Smoking, Alcohol Use   REVIEW OF SYSTEMS:    GU Review Male:   Patient denies hard to postpone urination, stream starts and stops, frequent urination, have to strain to urinate , erection problems, get up at night to urinate, leakage of urine, trouble starting your stream, penile pain, and burning/ pain with urination.  Gastrointestinal (Upper):   Patient denies nausea, vomiting, and indigestion/ heartburn.  Gastrointestinal (Lower):   Patient denies diarrhea and constipation.  Constitutional:   Patient denies fever, night sweats, weight loss, and fatigue.  Skin:   Patient denies skin rash/ lesion and itching.  Eyes:   Patient denies blurred vision and double vision.  Ears/ Nose/ Throat:   Patient denies sore throat and sinus problems.  Hematologic/Lymphatic:   Patient denies swollen glands and easy bruising.  Cardiovascular:   Patient denies leg swelling and chest pains.  Respiratory:   Patient denies cough and shortness of breath.  Endocrine:   Patient denies excessive thirst.  Musculoskeletal:   Patient denies back pain and joint pain.  Neurological:   Patient denies headaches and dizziness.  Psychologic:   Patient denies depression and anxiety.   VITAL SIGNS: None   MULTI-SYSTEM PHYSICAL EXAMINATION:    Constitutional: Well-nourished. No physical deformities. Normally developed. Good grooming.  Respiratory: Normal breath sounds. No labored breathing, no use of accessory muscles.   Cardiovascular: Regular rate and rhythm. No murmur, no gallop.      Complexity of Data:  Records Review:   Previous Patient Records  Urine Test Review:   Urinalysis   11/20/04  PSA  Total  PSA 0.61     PROCEDURES:         Flexible Cystoscopy - 52000  Risks, benefits, and some of the potential complications of the procedure were discussed. He was prepped with betadine and the urethral was instilled with 2% lidocaine jelly. Cipro 500mg  given for antibiotic prophylaxis.     Meatus:  Normal size. Normal location. Normal condition.  Urethra:  No strictures.  External Sphincter:  Normal.  Verumontanum:  Normal.  Prostate:  Borderline obstructing. Mild hyperplasia.  Bladder Neck:  Non-obstructing.  Ureteral Orifices:  Normal location. Normal size. Normal shape. Effluxed clear urine.  Bladder:  Mild trabeculation. No tumors. Normal mucosa. No stones.      The procedure was well tolerated and there were no complications.         Urinalysis w/Scope Dipstick Dipstick Cont'd Micro  Color: Amber Bilirubin: Neg mg/dL WBC/hpf: 6 - 74/QVZ  Appearance: Slightly Cloudy Ketones: Neg mg/dL RBC/hpf: 20 - 56/LOV  Specific Gravity: 1.020 Blood: 2+ ery/uL Bacteria: Few (10-25/hpf)  pH: 5.5 Protein: Trace mg/dL Cystals: Ca Oxalate  Glucose: Neg mg/dL Urobilinogen: 0.2 mg/dL Casts: Hyaline    Nitrites: Neg Trichomonas: Not Present    Leukocyte Esterase: 2+ leu/uL Mucous: Present      Epithelial Cells: 0 - 5/hpf      Yeast: NS (Not Seen)      Sperm: Not Present    ASSESSMENT:      ICD-10 Details  1 GU:   Gross hematuria - R31.0 Chronic, Stable, Improving - The bleeding is better but not resolved. CT shows no clear cause of the bleeding and cystoscopy is negative. No bladder stones are noted so the stone material appears to be prostatic. He continues to have some flank pain and if the bleeding and pain persist, I may need to consider cystoscopy with retrograde, and possible ureteroscopy.   2   Flank Pain - R10.84 Acute, Stable  3   Renal calculus - N20.0 Minor   PLAN:           Orders Labs Urine Culture          Schedule Return Visit/Planned Activity: Next Available Appointment -  Schedule Surgery  Procedure: Unspecified Date - Cystoscopy Ureteroscopy - 56433, bilateral          Document Letter(s):  Created for Patient: Clinical Summary         Notes:   CC: Dr. Assunta Found.         Next Appointment:      Next Appointment: 06/29/2023 10:15 AM    Appointment Type: Office Visit Established Patient    Location: [facility] [phone] [id number]    Provider: Bjorn Pippin, M.D.    Reason for Visit: 3 mo OV

## 2023-07-11 NOTE — Anesthesia Preprocedure Evaluation (Addendum)
Anesthesia Evaluation  Patient identified by MRN, date of birth, ID band Patient awake    Reviewed: Allergy & Precautions, NPO status , Patient's Chart, lab work & pertinent test results  History of Anesthesia Complications Negative for: history of anesthetic complications  Airway Mallampati: III  TM Distance: >3 FB Neck ROM: Full   Comment: Previous grade I view with MAC 3, easy mask Dental  (+) Dental Advisory Given   Pulmonary neg shortness of breath, neg sleep apnea, neg COPD, neg recent URI, former smoker   Pulmonary exam normal breath sounds clear to auscultation       Cardiovascular hypertension (amlodipine, metoprolol), Pt. on medications and Pt. on home beta blockers (-) angina + CAD and + CABG (10/19/2013)  (-) Past MI and (-) Cardiac Stents + dysrhythmias Atrial Fibrillation  Rhythm:Regular Rate:Normal  HLD  Stress Test 10/05/2021: Narrative & Impression     Findings are consistent with small prior inferior/inferoseptal myocardial infarction with very mild peri-infarct ischemia. The study is low risk.   No ST deviation was noted.   There is a small mild intensity inferior/inferoseptal defect with very mild reversibility.   Left ventricular function is normal. Nuclear stress EF: 57 %. The left ventricular ejection fraction is normal (55-65%). End diastolic cavity size is normal.  TTE 03/20/2020: IMPRESSIONS     1. Left ventricular ejection fraction, by estimation, is 50 to 55%. The  left ventricle has low normal function. The left ventricle has no regional  wall motion abnormalities. There is mild left ventricular hypertrophy of  the basal-septal segment. Left  ventricular diastolic function could not be evaluated.   2. Right ventricular systolic function is normal. The right ventricular  size is normal. Tricuspid regurgitation signal is inadequate for assessing  PA pressure.   3. Left atrial size was severely  dilated.   4. The mitral valve is normal in structure. Trivial mitral valve  regurgitation. No evidence of mitral stenosis.   5. The aortic valve is tricuspid. Aortic valve regurgitation is not  visualized. Mild aortic valve sclerosis is present, with no evidence of  aortic valve stenosis.   6. The inferior vena cava is normal in size with greater than 50%  respiratory variability, suggesting right atrial pressure of 3 mmHg.      Neuro/Psych neg Seizures  Neuromuscular disease (lumbago, myasthenia gravis)    GI/Hepatic Neg liver ROS,GERD  Medicated,,  Endo/Other  negative endocrine ROS    Renal/GU negative Renal ROS     Musculoskeletal  (+) Arthritis ,    Abdominal   Peds  Hematology  (+) Blood dyscrasia, anemia Lab Results      Component                Value               Date                      WBC                      6.2                 07/07/2023                HGB                      13.2  07/07/2023                HCT                      42.6                07/07/2023                MCV                      92.8                07/07/2023                PLT                      229                 07/07/2023              Anesthesia Other Findings   Reproductive/Obstetrics                              Anesthesia Physical Anesthesia Plan  ASA: 3  Anesthesia Plan: General   Post-op Pain Management: Tylenol PO (pre-op)*   Induction: Intravenous  PONV Risk Score and Plan: 2 and Ondansetron, Dexamethasone and Treatment may vary due to age or medical condition  Airway Management Planned: LMA  Additional Equipment:   Intra-op Plan:   Post-operative Plan: Extubation in OR  Informed Consent: I have reviewed the patients History and Physical, chart, labs and discussed the procedure including the risks, benefits and alternatives for the proposed anesthesia with the patient or authorized representative who has indicated  his/her understanding and acceptance.     Dental advisory given  Plan Discussed with: CRNA and Anesthesiologist  Anesthesia Plan Comments: (See PAT note from 11/21 by K Gekas PA-C  Risks of general anesthesia discussed including, but not limited to, sore throat, hoarse voice, chipped/damaged teeth, injury to vocal cords, nausea and vomiting, allergic reactions, lung infection, heart attack, stroke, and death. All questions answered.  )         Anesthesia Quick Evaluation

## 2023-07-11 NOTE — Progress Notes (Signed)
Case: [id number] Date/Time: 07/12/23 0845   Procedure: CYSTOSCOPY WITH BILATERAL RETROGRADE RIGH T URETEROSCOPY POSSIBLE LEFT URETEROSCOPY AND STENT PLACEMENT (Bilateral) - 1 HR FOR CASE   Anesthesia type: General   Pre-op diagnosis: HEMATURIA   Location: [facility]   Surgeons: Bjorn Pippin, MD       DISCUSSION: Brandon [name] is a 77 yo male who presents to PAT prior to surgery above. PMH of former smoking, HTN, CAD s/p CABG (2015), GERD, myasthenia gravis, DDD of lumbar spine, arthritis  Patient had a R TKA on 05/02/23 without complications.  Patient follows with Cardiology for CAD s/p CABG. Last seen on 01/26/23. Per Dr. Diona Browner. Noted to be doing well at that visit. He was given cardiac clearance for his R TKA which was on 05/02/23 and was without complications. He received clearance for upcoming surgery via telephone visit:  "Preoperative Cardiovascular Risk Assessment:   According to the Revised Cardiac Risk Index (RCRI), his Perioperative Risk of Major Cardiac Event is (%): 0.9. His Functional Capacity in METs is: 5.62 according to the Duke Activity Status Index (DASI). Therefore, based on ACC/AHA guidelines, patient would be at acceptable risk for the planned procedure without further cardiovascular testing.   Per office protocol, he may hold Aspirin for 5-7 days prior to procedure. Please resume Aspirin as soon as possible postprocedure, at the discretion of the surgeon."   Patient follows with Neurology for myasthenia gravis. Last seen on 09/16/22. Per Dr. Marjory Lies: "Since last visit, doing well. Symptoms are stable. Severity  Tolerating meds.  No MG sxs" Advised f/u in 1 year.    VS: BP (!) 144/85   Pulse (!) 54   Temp 36.8 C (Oral)   Resp 16   Ht 6\' 1"  (1.854 m)   Wt 81.7 kg   SpO2 99%   BMI 23.77 kg/m   PROVIDERS: PCP - Assunta Found, MD Cardiologist - Nona Dell, MD   LABS: Labs reviewed: Acceptable for surgery. (all labs ordered are listed, but  only abnormal results are displayed)  Labs Reviewed  SURGICAL PCR SCREEN - Abnormal; Notable for the following components:      Result Value   MRSA, PCR POSITIVE (*)    Staphylococcus aureus POSITIVE (*)    All other components within normal limits  BASIC METABOLIC PANEL - Abnormal; Notable for the following components:   Glucose, Bld 104 (*)    All other components within normal limits  CBC    IMAGES:     EKG 01/26/23   Sinus bradycardia, rate 52     CV:   Stress test 10/05/2021:   Findings are consistent with small prior inferior/inferoseptal myocardial infarction with very mild peri-infarct ischemia. The study is low risk.   No ST deviation was noted.   There is a small mild intensity inferior/inferoseptal defect with very mild reversibility.   Left ventricular function is normal. Nuclear stress EF: 57 %. The left ventricular ejection fraction is normal (55-65%). End diastolic cavity size is normal.   Echo 03/20/2020: IMPRESSIONS     1. Left ventricular ejection fraction, by estimation, is 50 to 55%. The  left ventricle has low normal function. The left ventricle has no regional  wall motion abnormalities. There is mild left ventricular hypertrophy of  the basal-septal segment. Left  ventricular diastolic function could not be evaluated.   2. Right ventricular systolic function is normal. The right ventricular  size is normal. Tricuspid regurgitation signal is inadequate for assessing  PA  pressure.   3. Left atrial size was severely dilated.   4. The mitral valve is normal in structure. Trivial mitral valve  regurgitation. No evidence of mitral stenosis.   5. The aortic valve is tricuspid. Aortic valve regurgitation is not  visualized. Mild aortic valve sclerosis is present, with no evidence of  aortic valve stenosis.   6. The inferior vena cava is normal in size with greater than 50%  respiratory variability, suggesting right atrial pressure of 3 mmHg.    Past  Medical History:  Diagnosis Date   Anemia    Arthritis    Coronary atherosclerosis of native coronary artery    Multivessel status post CABG March 2015   DDD (degenerative disc disease), lumbar    Essential hypertension    GERD (gastroesophageal reflux disease)    Hard of hearing both ears    Left ear is worst   Hemorrhoids    History of gout    History of kidney stones    history of myasthenia Gravis 2021   History of [facility] spotted fever    Left ureteral  & renal stone    Lumbago    Mixed hyperlipidemia    Personal history of COVID-19 08/10/2021   Paxlovid   Plantar fascial fibromatosis    Postoperative atrial fibrillation (HCC)    Skin Cancer (HCC)    Use of cane as ambulatory aid    Uses roller walker    UTI (urinary tract infection) 09/16/2021   Wears glasses     Past Surgical History:  Procedure Laterality Date   AMPUTATION Right 03/27/2014   Procedure: DEBRIDEMENT AND CLOSURE RIGHT INDEX FINGER;  Surgeon: Sharma Covert, MD;  Location: [facility];  Service: Orthopedics;  Laterality: Right;   BACK SURGERY     lower not sure when per wife on 10-01-2021   BACK SURGERY     BICEPT TENODESIS  11/26/2011   Procedure: BICEPT TENODESIS;  Surgeon: Verlee Rossetti, MD;  Location: [facility] OR;  Service: Orthopedics;  Laterality: Left;   BIOPSY  08/03/2018   Procedure: BIOPSY;  Surgeon: Malissa Hippo, MD;  [facility];  Service: Endoscopy;;  antral   Cataract surgery Bilateral    yrs ago per wife on 10-01-2021   CHOLECYSTECTOMY N/A 09/22/2018   Procedure: LAPAROSCOPIC CHOLECYSTECTOMY;  Surgeon: Lucretia Roers, MD;  Location: [facility];  Service: General;  Laterality: N/A;   COLONOSCOPY N/A 12/26/2014   Procedure: COLONOSCOPY;  Surgeon: Malissa Hippo, MD;  [facility];  Service: Endoscopy;  Laterality: N/A;  730   CORONARY ARTERY BYPASS GRAFT N/A 10/19/2013   Procedure: CORONARY ARTERY BYPASS GRAFTING (CABG);  Surgeon: Purcell Nails, MD;  Location:  [facility] OR;  Service: Open Heart Surgery;  Laterality: N/A;  CABG times four using left internal mammary artery and left leg saphenous vein, incision made on right leg but no vein removed   CYSTOSCOPY W/ URETERAL STENT PLACEMENT Right 02/22/2022   Procedure: CYSTOSCOPY WITH RETROGRADE PYELOGRAM/URETERAL STENT PLACEMENT;  Surgeon: Rene Paci, MD;  [facility];  Service: Urology;  Laterality: Right;   CYSTOSCOPY WITH RETROGRADE PYELOGRAM, URETEROSCOPY AND STENT PLACEMENT Left 08/24/2016   Procedure: CYSTOSCOPY WITH LEFT RETROGRADE PYELOGRAM, LEFT URETEROSCOPY, BASKET EXTRACTION LEFT URETERAL STONE;  Surgeon: Bjorn Pippin, MD;  [facility];  Service: Urology;  Laterality: Left;   CYSTOSCOPY/URETEROSCOPY/HOLMIUM LASER/STENT PLACEMENT Left 10/06/2021   Procedure: CYSTOSCOPY LEFT RETROGRADE PYELOGRAM URETEROSCOPY/HOLMIUM LASER/STENT PLACEMENT;  Surgeon: Bjorn Pippin, MD;  Location:  [facility];  Service: Urology;  Laterality: Left;   CYSTOSCOPY/URETEROSCOPY/HOLMIUM LASER/STENT PLACEMENT Right 03/01/2022   Procedure: CYSTOSCOPY RIGHT URETEROSCOPY/HOLMIUM LASER/STENT PLACEMENT;  Surgeon: Bjorn Pippin, MD;  [facility];  Service: Urology;  Laterality: Right;   ESOPHAGOGASTRODUODENOSCOPY N/A 08/03/2018   Procedure: ESOPHAGOGASTRODUODENOSCOPY (EGD);  Surgeon: Malissa Hippo, MD;  [facility];  Service: Endoscopy;  Laterality: N/A;  2:55   EXTRACORPOREAL SHOCK WAVE LITHOTRIPSY Right 06/18/2019   Procedure: EXTRACORPOREAL SHOCK WAVE LITHOTRIPSY (ESWL);  Surgeon: Crista Elliot, MD;  [facility];  Service: Urology;  Laterality: Right;   INTRAOPERATIVE TRANSESOPHAGEAL ECHOCARDIOGRAM N/A 10/19/2013   Procedure: INTRAOPERATIVE TRANSESOPHAGEAL ECHOCARDIOGRAM;  Surgeon: Purcell Nails, MD;  Location: [facility] OR;  Service: Open Heart Surgery;  Laterality: N/A;   KYPHOPLASTY N/A 03/27/2021   Procedure: Lumbar One and Lumbar Two Kyphoplasty;  Surgeon: Maeola Harman,  MD;  Location: [facility] OR;  Service: Neurosurgery;  Laterality: N/A;   left hand carpal tunnel release  04/04/2015   LEFT HEART CATHETERIZATION WITH CORONARY ANGIOGRAM N/A 10/09/2013   Procedure: LEFT HEART CATHETERIZATION WITH CORONARY ANGIOGRAM;  Surgeon: Kathleene Hazel, MD;  Location: [facility] CATH LAB;  Service: Cardiovascular;  Laterality: N/A;   Left shoulder rotator cuff repair  12/04/2011   MOHS procedure  01/06/2021   dr Modena Jansky on left hand   ORIF PERIPROSTHETIC FRACTURE Left 03/19/2020   Procedure: OPEN REDUCTION INTERNAL FIXATION (ORIF) PERIPROSTHETIC FRACTURE LEFT FEMUR WITH FEMORAL REVISION;  Surgeon: Ollen Gross, MD;  [facility];  Service: Orthopedics;  Laterality: Left;   REVERSE SHOULDER ARTHROPLASTY Right 10/08/2022   Procedure: REVERSE SHOULDER ARTHROPLASTY;  Surgeon: Beverely Low, MD;  [facility];  Service: Orthopedics;  Laterality: Right;  general, choice with interscalene block   TOTAL HIP ARTHROPLASTY Left 01/28/2020   Procedure: TOTAL HIP ARTHROPLASTY ANTERIOR APPROACH;  Surgeon: Ollen Gross, MD;  [facility];  Service: Orthopedics;  Laterality: Left;    TOTAL KNEE ARTHROPLASTY Left 04/18/2001   TOTAL KNEE ARTHROPLASTY Right 05/02/2023   Procedure: TOTAL KNEE ARTHROPLASTY;  Surgeon: Ollen Gross, MD;  [facility];  Service: Orthopedics;  Laterality: Right;   traumaticvamputation of finger  2015   VASECTOMY     yrs ago per wife on 10-01-2021    MEDICATIONS:  allopurinol (ZYLOPRIM) 300 MG tablet   amLODipine (NORVASC) 5 MG tablet   Ascorbic Acid (VITAMIN C) 1000 MG tablet   aspirin EC 81 MG tablet   Calcium Carb-Cholecalciferol (CALCIUM 600+D3 PO)   chlorhexidine (HIBICLENS) 4 % external liquid   methocarbamol (ROBAXIN) 500 MG tablet   metoprolol tartrate (LOPRESSOR) 25 MG tablet   Misc Natural Products (OSTEO BI-FLEX ADV TRIPLE ST) TABS   Multiple Vitamin (MULTIVITAMIN) tablet   nitroGLYCERIN (NITROSTAT) 0.4 MG SL tablet    Omega-3 Fatty Acids (FISH OIL) 500 MG CAPS   ondansetron (ZOFRAN) 4 MG tablet   oxyCODONE (OXY IR/ROXICODONE) 5 MG immediate release tablet   pantoprazole (PROTONIX) 40 MG tablet   pravastatin (PRAVACHOL) 40 MG tablet   pyridostigmine (MESTINON) 60 MG tablet   tamsulosin (FLOMAX) 0.4 MG CAPS capsule   traMADol (ULTRAM) 50 MG tablet   No current facility-administered medications for this encounter.   Marcille Blanco [facility] Short Stay/Anesthesiology [facility] Phone [phone] 07/11/2023 8:54 AM

## 2023-07-12 ENCOUNTER — Encounter (HOSPITAL_COMMUNITY): Payer: Self-pay | Admitting: Urology

## 2023-07-12 ENCOUNTER — Ambulatory Visit (HOSPITAL_COMMUNITY)
Admission: RE | Admit: 2023-07-12 | Discharge: 2023-07-12 | Disposition: A | Discharge status: Home / Self Care | Payer: Medicare Other | Attending: Urology | Admitting: Urology

## 2023-07-12 ENCOUNTER — Ambulatory Visit (HOSPITAL_COMMUNITY): Payer: Self-pay | Admitting: Anesthesiology

## 2023-07-12 ENCOUNTER — Ambulatory Visit (HOSPITAL_COMMUNITY): Payer: Self-pay | Admitting: Medical

## 2023-07-12 ENCOUNTER — Encounter (HOSPITAL_COMMUNITY)
Admission: RE | Disposition: A | Discharge status: Home / Self Care | Payer: Self-pay | Source: Home / Self Care | Attending: Urology

## 2023-07-12 ENCOUNTER — Ambulatory Visit (HOSPITAL_COMMUNITY): Payer: Medicare Other

## 2023-07-12 ENCOUNTER — Other Ambulatory Visit: Payer: Self-pay

## 2023-07-12 DIAGNOSIS — I2510 Atherosclerotic heart disease of native coronary artery without angina pectoris: Secondary | ICD-10-CM | POA: Diagnosis not present

## 2023-07-12 DIAGNOSIS — R319 Hematuria, unspecified: Secondary | ICD-10-CM | POA: Diagnosis not present

## 2023-07-12 DIAGNOSIS — N211 Calculus in urethra: Secondary | ICD-10-CM | POA: Insufficient documentation

## 2023-07-12 DIAGNOSIS — I10 Essential (primary) hypertension: Secondary | ICD-10-CM | POA: Diagnosis not present

## 2023-07-12 DIAGNOSIS — R310 Gross hematuria: Secondary | ICD-10-CM | POA: Insufficient documentation

## 2023-07-12 HISTORY — PX: CYSTOSCOPY WITH RETROGRADE PYELOGRAM, URETEROSCOPY AND STENT PLACEMENT: SHX5789

## 2023-07-12 SURGERY — CYSTOURETEROSCOPY, WITH RETROGRADE PYELOGRAM AND STENT INSERTION
Anesthesia: General | Laterality: Bilateral

## 2023-07-12 MED ORDER — CHLORHEXIDINE GLUCONATE 0.12 % MT SOLN
15.0000 mL | Freq: Once | OROMUCOSAL | Status: AC
  Administered 2023-07-12: 15 mL via OROMUCOSAL

## 2023-07-12 MED ORDER — OXYCODONE HCL 5 MG PO TABS: 5.0000 mg | ORAL_TABLET | Freq: Once | ORAL | Status: DC | PRN

## 2023-07-12 MED ORDER — ONDANSETRON HCL 4 MG/2ML IJ SOLN
INTRAMUSCULAR | Status: DC | PRN
  Administered 2023-07-12: 4 mg via INTRAVENOUS

## 2023-07-12 MED ORDER — ACETAMINOPHEN 500 MG PO TABS
ORAL_TABLET | ORAL | Status: AC
  Filled 2023-07-12: qty 2

## 2023-07-12 MED ORDER — SODIUM CHLORIDE 0.9 % IR SOLN
Status: DC | PRN
  Administered 2023-07-12 (×3): 3000 mL

## 2023-07-12 MED ORDER — DEXAMETHASONE SODIUM PHOSPHATE 10 MG/ML IJ SOLN
INTRAMUSCULAR | Status: AC
  Filled 2023-07-12: qty 1

## 2023-07-12 MED ORDER — EPHEDRINE 5 MG/ML INJ
INTRAVENOUS | Status: AC
  Filled 2023-07-12: qty 5

## 2023-07-12 MED ORDER — EPHEDRINE SULFATE (PRESSORS) 50 MG/ML IJ SOLN
INTRAMUSCULAR | Status: DC | PRN
  Administered 2023-07-12: 7.5 mg via INTRAVENOUS
  Administered 2023-07-12: 5 mg via INTRAVENOUS

## 2023-07-12 MED ORDER — LIDOCAINE HCL (PF) 2 % IJ SOLN
INTRAMUSCULAR | Status: AC
  Filled 2023-07-12: qty 5

## 2023-07-12 MED ORDER — SODIUM CHLORIDE 0.9% FLUSH: 3.0000 mL | Freq: Two times a day (BID) | INTRAVENOUS | Status: DC

## 2023-07-12 MED ORDER — MIDAZOLAM HCL 2 MG/2ML IJ SOLN
INTRAMUSCULAR | Status: AC
  Filled 2023-07-12: qty 2

## 2023-07-12 MED ORDER — ONDANSETRON HCL 4 MG/2ML IJ SOLN
INTRAMUSCULAR | Status: AC
  Filled 2023-07-12: qty 2

## 2023-07-12 MED ORDER — PROPOFOL 10 MG/ML IV BOLUS
INTRAVENOUS | Status: AC
  Filled 2023-07-12: qty 20

## 2023-07-12 MED ORDER — ACETAMINOPHEN 500 MG PO TABS
1000.0000 mg | ORAL_TABLET | Freq: Once | ORAL | Status: AC
  Administered 2023-07-12: 1000 mg via ORAL

## 2023-07-12 MED ORDER — CHLORHEXIDINE GLUCONATE 0.12 % MT SOLN: 15.0000 mL | Freq: Once | OROMUCOSAL | Status: DC

## 2023-07-12 MED ORDER — FENTANYL CITRATE (PF) 100 MCG/2ML IJ SOLN
INTRAMUSCULAR | Status: AC
  Filled 2023-07-12: qty 2

## 2023-07-12 MED ORDER — GLYCOPYRROLATE 0.2 MG/ML IJ SOLN
INTRAMUSCULAR | Status: DC | PRN
  Administered 2023-07-12: .1 mg via INTRAVENOUS

## 2023-07-12 MED ORDER — CEFAZOLIN SODIUM-DEXTROSE 2-4 GM/100ML-% IV SOLN
INTRAVENOUS | Status: AC
  Filled 2023-07-12: qty 100

## 2023-07-12 MED ORDER — ORAL CARE MOUTH RINSE: 15.0000 mL | Freq: Once | OROMUCOSAL | Status: DC

## 2023-07-12 MED ORDER — LACTATED RINGERS IV SOLN
INTRAVENOUS | Status: DC
Start: 2023-07-12 — End: 2023-07-12

## 2023-07-12 MED ORDER — ORAL CARE MOUTH RINSE: 15.0000 mL | Freq: Once | OROMUCOSAL | Status: AC

## 2023-07-12 MED ORDER — IOHEXOL 300 MG/ML  SOLN
INTRAMUSCULAR | Status: DC | PRN
  Administered 2023-07-12: 15 mL

## 2023-07-12 MED ORDER — CEFAZOLIN SODIUM-DEXTROSE 2-4 GM/100ML-% IV SOLN
2.0000 g | INTRAVENOUS | Status: AC
  Administered 2023-07-12: 2 g via INTRAVENOUS

## 2023-07-12 MED ORDER — FENTANYL CITRATE PF 50 MCG/ML IJ SOSY
25.0000 ug | PREFILLED_SYRINGE | INTRAMUSCULAR | Status: DC | PRN
Start: 2023-07-12 — End: 2023-07-12

## 2023-07-12 MED ORDER — AMISULPRIDE (ANTIEMETIC) 5 MG/2ML IV SOLN: 10.0000 mg | Freq: Once | INTRAVENOUS | Status: DC | PRN

## 2023-07-12 MED ORDER — MIDAZOLAM HCL 5 MG/5ML IJ SOLN
INTRAMUSCULAR | Status: DC | PRN
  Administered 2023-07-12: 2 mg via INTRAVENOUS

## 2023-07-12 MED ORDER — OXYCODONE HCL 5 MG/5ML PO SOLN: 5.0000 mg | Freq: Once | ORAL | Status: DC | PRN

## 2023-07-12 MED ORDER — FENTANYL CITRATE (PF) 100 MCG/2ML IJ SOLN
INTRAMUSCULAR | Status: DC | PRN
  Administered 2023-07-12: 75 ug via INTRAVENOUS
  Administered 2023-07-12: 50 ug via INTRAVENOUS
  Administered 2023-07-12: 25 ug via INTRAVENOUS

## 2023-07-12 MED ORDER — PROPOFOL 10 MG/ML IV BOLUS
INTRAVENOUS | Status: DC | PRN
  Administered 2023-07-12: 130 mg via INTRAVENOUS

## 2023-07-12 MED ORDER — LIDOCAINE HCL 1 % IJ SOLN
INTRAMUSCULAR | Status: DC | PRN
  Administered 2023-07-12: 100 mg via INTRADERMAL

## 2023-07-12 MED ORDER — DEXAMETHASONE SODIUM PHOSPHATE 10 MG/ML IJ SOLN
INTRAMUSCULAR | Status: DC | PRN
  Administered 2023-07-12: 4 mg via INTRAVENOUS

## 2023-07-12 MED ORDER — GLYCOPYRROLATE 0.2 MG/ML IJ SOLN
INTRAMUSCULAR | Status: AC
  Filled 2023-07-12: qty 1

## 2023-07-12 SURGICAL SUPPLY — 21 items
BAG URO CATCHER STRL LF (MISCELLANEOUS) ×1 IMPLANT
BASKET STONE NCOMPASS (UROLOGICAL SUPPLIES) IMPLANT
CATH URETERAL DUAL LUMEN 10F (MISCELLANEOUS) IMPLANT
CATH URETL OPEN 5X70 (CATHETERS) IMPLANT
CLOTH BEACON ORANGE TIMEOUT ST (SAFETY) ×1 IMPLANT
EXTRACTOR STONE NITINOL NGAGE (UROLOGICAL SUPPLIES) IMPLANT
GLOVE SURG SS PI 8.0 STRL IVOR (GLOVE) ×1 IMPLANT
GOWN SPEC L4 XLG W/TWL (GOWN DISPOSABLE) ×1 IMPLANT
GUIDEWIRE STR DUAL SENSOR (WIRE) ×1 IMPLANT
IV NS IRRIG 3000ML ARTHROMATIC (IV SOLUTION) ×1 IMPLANT
KIT TURNOVER KIT A (KITS) IMPLANT
LASER FIB FLEXIVA PULSE ID 365 (Laser) IMPLANT
LASER FIB FLEXIVA PULSE ID 550 (Laser) IMPLANT
LASER FIB FLEXIVA PULSE ID 910 (Laser) IMPLANT
LOOP CUT BIPOLAR 24F LRG (ELECTROSURGICAL) IMPLANT
MANIFOLD NEPTUNE II (INSTRUMENTS) ×1 IMPLANT
PACK CYSTO (CUSTOM PROCEDURE TRAY) ×1 IMPLANT
SHEATH NAVIGATOR HD 11/13X36 (SHEATH) IMPLANT
TRACTIP FLEXIVA PULS ID 200XHI (Laser) IMPLANT
TUBING CONNECTING 10 (TUBING) ×1 IMPLANT
TUBING UROLOGY SET (TUBING) ×1 IMPLANT

## 2023-07-12 NOTE — Interval H&P Note (Signed)
History and Physical Interval Note:  07/12/2023 7:27 AM  Brandon [name]  has presented today for surgery, with the diagnosis of HEMATURIA.  The various methods of treatment have been discussed with the patient and family. After consideration of risks, benefits and other options for treatment, the patient has consented to  Procedure(s) with comments: CYSTOSCOPY WITH BILATERAL RETROGRADE RIGH T URETEROSCOPY POSSIBLE LEFT URETEROSCOPY AND STENT PLACEMENT (Bilateral) - 1 HR FOR CASE as a surgical intervention.  The patient's history has been reviewed, patient examined, no change in status, stable for surgery.  I have reviewed the patient's chart and labs.  Questions were answered to the patient's satisfaction.     Bjorn Pippin

## 2023-07-12 NOTE — Op Note (Signed)
Procedure: 1.  Cystoscopy with bilateral retrograde pyelograms and interpretation. 2.  Right diagnostic ureteroscopy. 3.  Removal of multiple prostatic urethral stones, complex. 4.  Fulguration of prostatic urethral bleeders. 5.  Application of fluoroscopy.  Preop diagnosis: Gross hematuria with right flank pain.  Postop diagnosis: Gross hematuria with prostatic urethral calculi.  Surgeon: Dr. Bjorn Pippin.  Anesthesia: General.  Specimen: Prostatic urethral stones.  EBL: 20 mL.  Drains: None.  Complications: None.  Findings: 1.  Normal bilateral retrograde pyelograms with Randall's plaque in multiple calyces in the right kidney. 2.  Multiple prostatic urethral calculi with associated mucosal inflammation that appears to be the source of the bleeding.  Indications: The patient is a 77 year old male whose had recurrent gross hematuria with some right flank pain and a CT scan that showed small bilateral renal stones without obstruction.  The pelvic ureters were not well-visualized because of the presence of bilateral hip implants.  It was felt that cystoscopy with retrograde pyelography and possible ureteroscopy was indicated.  He was also noted to have multiple prostatic stones on a CT scan left, greater than right.  Procedure: He was taken the operating room where a general anesthetic was induced.  He was given 2 g of Ancef.  He was placed in lithotomy position and fitted with PAS hose.  His perineum and genitalia were prepped Betadine solution he was draped in usual sterile fashion.  Cystoscopy was performed using the 21 [place] scope and 30 degree lens.  Examination revealed a normal urethra.  The external sphincter was intact.  The prostatic urethra was proximately 3 cm in length with lateral lobe hyperplasia with coaptation.  Examination of bladder revealed mild trabeculation without tumors, stones or inflammation.  The ureteral orifices were unremarkable.  A right retrograde pyelogram  was performed.  The right ureteral orifice was cannulated with 5 [place] open-ended catheter and Omnipaque was instilled.  The right retrograde pyelogram demonstrated a dense calcification that was linear that was in line with the ureter but then not create a true filling defect but it was in a mildly dilated section of the distal ureter.  The intrarenal collecting system was unremarkable.  The system drained rapidly upon removal of the ureteral catheter.  A left retrograde pyelogram was performed using a 5 [place] open-ended catheter and Omnipaque.  The left retrograde pyelogram demonstrated a normal ureter and intrarenal collecting system that drained promptly.  We did have to oblique the C arm to see the mid ureter because of spinal hardware but it was unremarkable as well.  Because of the possible distal ureteral stone, a 4.5 French single-lumen semirigid ureteroscope was then advanced per urethra up the right ureteral orifice without difficulty and no stones were noted in the ureter and fluoroscopy with the ureteroscope in place demonstrated that the linear calcification was not within the ureter and was consistent with a vascular calcification.  I was able to advance the rigid scope to the UPJ without difficulty.  I then passed the single-lumen digital flexible scope and during passage through the prostatic urethra I encountered a pocket of prostatic urethral stones a couple of which were rather jagged and appeared to be associated with some inflammatory changes of the mucosa that bled easily.  I then passed the scope up the  right ureter with the aid of a sensor wire and inspection once again revealed a normal ureter.  The intrarenal collecting system was remarkable only for Randall's plaques in the otherwise normal calyces.  No free stones  or mucosal lesions were identified.  It was not felt that stenting was indicated because of the atraumatic nature of the scope passage.  The ureteroscope was  removed and the cystoscope was reinserted and a few stones were teased from the left cavity into the bladder and evacuated including the 2 that were more jagged and the likely cause of the bleeding.  But this maneuver started up a bit of bleeding so I had to replace the cystoscope with the 26 French continuous-flow resectoscope sheath with the aid of visual obturator after dilation of the meatus to 32 [place].  With the aid of the loop I was able to remove many more stones and fulgurate the area of bleeding on the prostatic urethral mucosa.  He had a second pocket on the right side that had fewer stones that I was able to tease out several of this as well..  Once I had removed all the stones that I felt were possible to remove and achieved hemostasis, I drained the bladder partially and remove the scope.  He was taken down from lithotomy position, his anesthetic was reversed and he was moved recovery in stable condition.  There were no complications.  I will give the stone to the family to bring to the office for analysis.

## 2023-07-12 NOTE — Anesthesia Postprocedure Evaluation (Signed)
Anesthesia Post Note  [name]  Procedure(s) Performed: CYSTOSCOPY WITH BILATERAL RETROGRADE RIGHT URETEROSCOPY; REMOVAL OF FOREIGN BODIES COMPLEX WITH FULGURATION (Bilateral)     Patient location during evaluation: PACU Anesthesia Type: General Level of consciousness: awake Pain management: pain level controlled Vital Signs Assessment: post-procedure vital signs reviewed and stable Respiratory status: spontaneous breathing, nonlabored ventilation and respiratory function stable Cardiovascular status: blood pressure returned to baseline and stable Postop Assessment: no apparent nausea or vomiting Anesthetic complications: no   No notable events documented.  Last Vitals:  Vitals:   07/12/23 1030 07/12/23 1051  BP: 124/88 135/89  Pulse: 60 (!) 57  Resp: 15 15  Temp:  36.4 C  SpO2: 98% 100%    Last Pain:  Vitals:   07/12/23 1030  TempSrc:   PainSc: 0-No pain                 Linton Rump

## 2023-07-12 NOTE — Transfer of Care (Signed)
Immediate Anesthesia Transfer of Care Note  [name]  Procedure(s) Performed: CYSTOSCOPY WITH BILATERAL RETROGRADE RIGHT URETEROSCOPY; REMOVAL OF FOREIGN BODIES COMPLEX WITH FULGURATION (Bilateral)  Patient Location: PACU  Anesthesia Type:General  Level of Consciousness: drowsy  Airway & Oxygen Therapy: Patient Spontanous Breathing and Patient connected to face mask oxygen  Post-op Assessment: Report given to RN and Post -op Vital signs reviewed and stable  Post vital signs: Reviewed and stable  Last Vitals:  Vitals Value Taken Time  BP 141/73 07/12/23 1010  Temp    Pulse 110 07/12/23 1011  Resp 14 07/12/23 1011  SpO2 99 % 07/12/23 1011  Vitals shown include unfiled device data.  Last Pain:  Vitals:   07/12/23 0706  TempSrc:   PainSc: 0-No pain         Complications: No notable events documented.

## 2023-07-12 NOTE — Discharge Instructions (Addendum)
CYSTOSCOPY HOME CARE INSTRUCTIONS  Activity: Rest for the remainder of the day.  Do not drive or operate equipment today.  You may resume normal activities in one to two days as instructed by your physician.   Meals: Drink plenty of liquids and eat light foods such as gelatin or soup this evening.  You may return to a normal meal plan tomorrow.  Return to Work: You may return to work in one to two days or as instructed by your physician.  Special Instructions / Symptoms: Call your physician if any of these symptoms occur:   -persistent or heavy bleeding  -bleeding which continues after first few urination  -large blood clots that are difficult to pass  -urine stream diminishes or stops completely  -fever equal to or higher than 101 degrees Farenheit.  -cloudy urine with a strong, foul odor  -severe pain   Patient Signature:  ________________________________________________________  Nurse's Signature:  ________________________________________________________

## 2023-07-13 ENCOUNTER — Encounter (HOSPITAL_COMMUNITY): Payer: Self-pay | Admitting: Urology

## 2023-07-21 DIAGNOSIS — N200 Calculus of kidney: Secondary | ICD-10-CM | POA: Diagnosis not present

## 2023-07-21 DIAGNOSIS — N210 Calculus in bladder: Secondary | ICD-10-CM | POA: Diagnosis not present

## 2023-07-21 DIAGNOSIS — R310 Gross hematuria: Secondary | ICD-10-CM | POA: Diagnosis not present

## 2023-07-27 ENCOUNTER — Ambulatory Visit: Payer: Medicare Other | Attending: Cardiology | Admitting: Cardiology

## 2023-07-27 ENCOUNTER — Encounter: Payer: Self-pay | Admitting: Cardiology

## 2023-07-27 VITALS — BP 110/60 | HR 60 | Ht 73.0 in | Wt 184.0 lb

## 2023-07-27 DIAGNOSIS — I25119 Atherosclerotic heart disease of native coronary artery with unspecified angina pectoris: Secondary | ICD-10-CM | POA: Diagnosis not present

## 2023-07-27 DIAGNOSIS — I10 Essential (primary) hypertension: Secondary | ICD-10-CM | POA: Insufficient documentation

## 2023-07-27 DIAGNOSIS — E782 Mixed hyperlipidemia: Secondary | ICD-10-CM | POA: Diagnosis not present

## 2023-07-27 NOTE — Progress Notes (Signed)
    Cardiology Office Note  Date: 07/27/2023   ID: [name], DOB 1946-04-11, MRN [id number]  History of Present Illness: Brandon [name] is a 77 y.o. male last seen in June.  He is here with his wife for a follow-up visit.  Reports no angina or nitroglycerin use, stable NYHA class II dyspnea.  He has had some recent problems with hematuria and following with urology.  Underwent cystoscopy in November.  I reviewed his medications.  Current cardiac regimen includes aspirin, Norvasc, Lopressor and Pravachol.  He continues to follow with Dr. Phillips Odor.  Blood pressure is well-controlled today.  His last LDL was 53.  Physical Exam: VS:  BP 110/60   Pulse 60   Ht 6\' 1"  (1.854 m)   Wt 184 lb (83.5 kg)   SpO2 96%   BMI 24.28 kg/m , BMI Body mass index is 24.28 kg/m.  Wt Readings from Last 3 Encounters:  07/27/23 184 lb (83.5 kg)  07/12/23 180 lb 1.9 oz (81.7 kg)  07/07/23 180 lb 3.2 oz (81.7 kg)    General: Patient appears comfortable at rest. HEENT: Conjunctiva and lids normal. Neck: Supple, no elevated JVP or carotid bruits. Lungs: Clear to auscultation, nonlabored breathing at rest. Cardiac: Regular rate and rhythm, no S3 or significant systolic murmur. Extremities: No pitting edema.  ECG:  An ECG dated 01/26/2023 was personally reviewed today and demonstrated:  Sinus bradycardia.  Labwork: November 2023: Cholesterol 153, triglycerides 116, HDL 70, LDL 63 07/07/2023: BUN 13; Creatinine, Ser 0.92; Hemoglobin 13.2; Platelets 229; Potassium 3.8; Sodium 136   Other Studies Reviewed Today:  No interval cardiac testing for review today.  Assessment and Plan:  1.  Multivessel CAD status post CABG in 2015 with LIMA to LAD, SVG to PDA, and sequential SVG to ramus intermedius and OM.  Lexiscan Myoview in February 2023 showed a small inferior/inferoseptal infarct scar with mild peri-infarct ischemia and LVEF 57%.  He has not had any angina in the interim and continues on medical  therapy.  Continue aspirin, Lopressor, and Pravachol.   2.  Mixed hyperlipidemia.  LDL 63 in November 2023.  Continue to follow lab work with PCP.  No change in Pravachol dosing.   3.  Primary hypertension.  Blood pressure well-controlled today.  He is also on Norvasc.  Disposition:  Follow up  6 months.  Signed, Jonelle Sidle, M.D., F.A.C.C.  [facility] at [facility]

## 2023-07-27 NOTE — Patient Instructions (Addendum)
Medication Instructions:  Your physician recommends that you continue on your current medications as directed. Please refer to the Current Medication list given to you today.  *If you need a refill on your cardiac medications before your next appointment, please call your pharmacy*   Lab Work: None If you have labs (blood work) drawn today and your tests are completely normal, you will receive your results only by: MyChart Message (if you have MyChart) OR A paper copy in the mail If you have any lab test that is abnormal or we need to change your treatment, we will call you to review the results.   Testing/Procedures: None   Follow-Up: At [facility], you and your health needs are our priority.  As part of our continuing mission to provide you with exceptional heart care, we have created designated Provider Care Teams.  These Care Teams include your primary Cardiologist (physician) and Advanced Practice Providers (APPs -  Physician Assistants and Nurse Practitioners) who all work together to provide you with the care you need, when you need it.  We recommend signing up for the patient portal called "MyChart".  Sign up information is provided on this After Visit Summary.  MyChart is used to connect with patients for Virtual Visits (Telemedicine).  Patients are able to view lab/test results, encounter notes, upcoming appointments, etc.  Non-urgent messages can be sent to your provider as well.   To learn more about what you can do with MyChart, go to [email or web address].    Your next appointment:   6 month(s)  Provider:   You may see Nona Dell, MD or one of the following Advanced Practice Providers on your designated Care Team:   Randall An, PA-C  Jacolyn Reedy, [place]     Other Instructions

## 2023-07-31 ENCOUNTER — Other Ambulatory Visit: Payer: Self-pay

## 2023-07-31 ENCOUNTER — Emergency Department (HOSPITAL_COMMUNITY): Payer: Medicare Other

## 2023-07-31 ENCOUNTER — Encounter (HOSPITAL_COMMUNITY): Payer: Self-pay

## 2023-07-31 ENCOUNTER — Emergency Department (HOSPITAL_COMMUNITY)
Admission: EM | Admit: 2023-07-31 | Discharge: 2023-07-31 | Disposition: A | Discharge status: Home / Self Care | Payer: Medicare Other | Attending: Emergency Medicine | Admitting: Emergency Medicine

## 2023-07-31 DIAGNOSIS — I10 Essential (primary) hypertension: Secondary | ICD-10-CM | POA: Diagnosis not present

## 2023-07-31 DIAGNOSIS — Z7982 Long term (current) use of aspirin: Secondary | ICD-10-CM | POA: Insufficient documentation

## 2023-07-31 DIAGNOSIS — M25512 Pain in left shoulder: Secondary | ICD-10-CM | POA: Diagnosis not present

## 2023-07-31 DIAGNOSIS — M19012 Primary osteoarthritis, left shoulder: Secondary | ICD-10-CM | POA: Diagnosis not present

## 2023-07-31 DIAGNOSIS — X500XXA Overexertion from strenuous movement or load, initial encounter: Secondary | ICD-10-CM | POA: Diagnosis not present

## 2023-07-31 DIAGNOSIS — Z85828 Personal history of other malignant neoplasm of skin: Secondary | ICD-10-CM | POA: Insufficient documentation

## 2023-07-31 DIAGNOSIS — S4992XA Unspecified injury of left shoulder and upper arm, initial encounter: Secondary | ICD-10-CM | POA: Diagnosis present

## 2023-07-31 DIAGNOSIS — Z79899 Other long term (current) drug therapy: Secondary | ICD-10-CM | POA: Diagnosis not present

## 2023-07-31 DIAGNOSIS — S46002A Unspecified injury of muscle(s) and tendon(s) of the rotator cuff of left shoulder, initial encounter: Secondary | ICD-10-CM | POA: Diagnosis not present

## 2023-07-31 MED ORDER — OXYCODONE HCL 5 MG PO TABS
5.0000 mg | ORAL_TABLET | Freq: Once | ORAL | Status: AC
  Administered 2023-07-31: 5 mg via ORAL
  Filled 2023-07-31: qty 1

## 2023-07-31 MED ORDER — ACETAMINOPHEN 500 MG PO TABS
1000.0000 mg | ORAL_TABLET | Freq: Once | ORAL | Status: AC
  Administered 2023-07-31: 1000 mg via ORAL
  Filled 2023-07-31: qty 2

## 2023-07-31 NOTE — ED Triage Notes (Signed)
Pt reports he tried to lift something heavy 2 days ago and felt a pop in his left shoulder and it was sore, and then today he lifted his arm to get in the cart and felt it pop again and now the pain is worse.

## 2023-07-31 NOTE — Discharge Instructions (Addendum)
Thank you for coming to [facility] Emergency Department. You were seen for left shoulder pain. We did an exam, and this showed a possible rotator cuff injury or tear. Please wear a shoulder sling and follow up with Dr. Lequita Halt in clinic next week. You can take 1,000 mg tylenol (acetaminophen) every 8 hours for pain. Rest and ice can also help inflammation/pain.   Do not hesitate to return to the ED or call 911 if you experience: -Worsening symptoms -Numbness/tingling in your arm or hand -Lightheadedness, passing out -Fevers/chills -Anything else that concerns you

## 2023-07-31 NOTE — ED Provider Notes (Signed)
[facility] EMERGENCY DEPARTMENT AT [facility] Provider Note   CSN: [id number] Arrival date & time: 07/31/23  1438     History {Add pertinent medical, surgical, social history, OB history to HPI:1} Chief Complaint  Patient presents with  . Shoulder Injury    Brandon [name] is a 77 y.o. male with PMH as listed below who presents with ***.   Pt reports he tried to lift something heavy 2 days ago and felt a pop in his left shoulder and it was sore, and then today he lifted his arm to get in the cart and felt it pop again and now the pain is worse.     Past Medical History:  Diagnosis Date  . Anemia   . Arthritis   . Coronary atherosclerosis of native coronary artery    Multivessel status post CABG March 2015  . DDD (degenerative disc disease), lumbar   . Essential hypertension   . GERD (gastroesophageal reflux disease)   . Hard of hearing both ears    Left ear is worst  . Hemorrhoids   . History of gout   . History of kidney stones   . history of myasthenia Gravis 2021  . History of [facility] spotted fever   . Left ureteral  & renal stone   . Lumbago   . Mixed hyperlipidemia   . Personal history of COVID-19 08/10/2021   Paxlovid  . Plantar fascial fibromatosis   . Postoperative atrial fibrillation (HCC)   . Skin Cancer (HCC)   . Use of cane as ambulatory aid   . Uses roller walker   . UTI (urinary tract infection) 09/16/2021  . Wears glasses        Home Medications Prior to Admission medications   Medication Sig Start Date End Date Taking? Authorizing Provider  allopurinol (ZYLOPRIM) 300 MG tablet Take 150 mg by mouth in the morning.    [provider]  amLODipine (NORVASC) 5 MG tablet Take 1 tablet (5 mg total) by mouth daily. 11/11/19   Burnadette Pop, MD  Ascorbic Acid (VITAMIN C) 1000 MG tablet Take 1,000 mg by mouth in the morning.    [provider]  aspirin EC 81 MG tablet Take 81 mg by mouth daily. Swallow whole.     [provider]  Calcium Carb-Cholecalciferol (CALCIUM 600+D3 PO) Take 1 tablet by mouth in the morning and at bedtime.    [provider]  metoprolol tartrate (LOPRESSOR) 25 MG tablet Take 1/2 (one-half) tablet by mouth twice daily 04/15/23   Jonelle Sidle, MD  Misc Natural Products (OSTEO BI-FLEX ADV TRIPLE ST) TABS Take 1 tablet by mouth in the morning.    [provider]  Multiple Vitamin (MULTIVITAMIN) tablet Take 1 tablet by mouth daily.    [provider]  Omega-3 Fatty Acids (FISH OIL) 500 MG CAPS Take 500 mg by mouth in the morning.    [provider]  pantoprazole (PROTONIX) 40 MG tablet Take 40 mg by mouth daily as needed (acid reflux). 09/10/22   [provider]  pravastatin (PRAVACHOL) 40 MG tablet Take 40 mg by mouth every evening.     [provider]  pyridostigmine (MESTINON) 60 MG tablet Take 0.5 tablets (30 mg total) by mouth 3 (three) times daily. Patient taking differently: Take 30 mg by mouth in the morning and at bedtime. 09/16/22 12/10/23  Penumalli, Glenford Bayley, MD  tamsulosin (FLOMAX) 0.4 MG CAPS capsule Take 1 capsule by mouth daily.  [provider]  traMADol (ULTRAM) 50 MG tablet Take 1-2 tablets (50-100 mg total) by mouth every 6 (six) hours as needed for moderate pain. 05/03/23   Edmisten, Lyn Hollingshead, PA      Allergies    Gabapentin    Review of Systems   Review of Systems A 10 point review of systems was performed and is negative unless otherwise reported in HPI.  Physical Exam Updated Vital Signs BP 132/74 (BP Location: Right Arm)   Pulse (!) 57   Temp 98.4 F (36.9 C) (Oral)   Resp 18   Ht 6\' 1"  (1.854 m)   Wt 82.6 kg   SpO2 97%   BMI 24.01 kg/m  Physical Exam General: Normal appearing {Desc; male/male:11659}, lying in bed.  HEENT: PERRLA, Sclera anicteric, MMM, trachea midline.  Cardiology: RRR, no murmurs/rubs/gallops. BL radial and DP pulses equal bilaterally.  Resp: Normal  respiratory rate and effort. CTAB, no wheezes, rhonchi, crackles.  Abd: Soft, non-tender, non-distended. No rebound tenderness or guarding.  GU: Deferred. MSK: No peripheral edema or signs of trauma. Extremities without deformity or TTP. No cyanosis or clubbing. Skin: warm, dry. No rashes or lesions. Back: No CVA tenderness Neuro: A&Ox4, CNs II-XII grossly intact. MAEs. Sensation grossly intact.  Psych: Normal mood and affect.   ED Results / Procedures / Treatments   Labs (all labs ordered are listed, but only abnormal results are displayed) Labs Reviewed - No data to display  EKG None  Radiology No results found.  Procedures Procedures  {Document cardiac monitor, telemetry assessment procedure when appropriate:1}  Medications Ordered in ED Medications - No data to display  ED Course/ Medical Decision Making/ A&P                          Medical Decision Making Amount and/or Complexity of Data Reviewed Radiology: ordered. Decision-making details documented in ED Course.  Risk OTC drugs. Prescription drug management.    This patient presents to the ED for concern of L shoulder pain, nontraumatic; this involves an extensive number of treatment options, and is a complaint that carries with it a high risk of complications and morbidity.  MDM:    Anterior shoulder dislocation most likely, but given mechanism and known glenohumeral joint instability, will consider posterior and inferior dislocations/subluxations as well. Fracture to the humerus, clavicle possible. Fx of scapula less likely.  Acromioclavicular joint injury possible.  Rotator cuff tear, biceps tendon rupture, and triceps tendon rupture considered but not likely given obvious deformity consistent with shoulder dislocation.***   Clinical Course as of 07/31/23 1745  Sun Jul 31, 2023  1605 DG Shoulder Left FINDINGS: The left shoulder is located. No acute fracture is present. Progressive degenerative changes  are present at the glenohumeral joint. Transverse widening of the left AC joint is stable. Median sternotomy noted.  IMPRESSION: 1. No acute fracture or dislocation. 2. Progressive degenerative changes at the glenohumeral joint. [HN]    Clinical Course User Index [HN] Loetta Rough, MD    Imaging Studies ordered: I ordered imaging studies including L shoulder XR I independently visualized and interpreted imaging. I agree with the radiologist interpretation  Additional history obtained from chart review.    Reevaluation: After the interventions noted above, I reevaluated the patient and found that they have :{resolved/improved/worsened:23923::"improved"}  Social Determinants of Health: .Lives independently  Disposition:  DC w/ discharge instructions/return precautions. All questions answered to patient's satisfaction.    Co morbidities that complicate  the patient evaluation . Past Medical History:  Diagnosis Date  . Anemia   . Arthritis   . Coronary atherosclerosis of native coronary artery    Multivessel status post CABG March 2015  . DDD (degenerative disc disease), lumbar   . Essential hypertension   . GERD (gastroesophageal reflux disease)   . Hard of hearing both ears    Left ear is worst  . Hemorrhoids   . History of gout   . History of kidney stones   . history of myasthenia Gravis 2021  . History of [facility] spotted fever   . Left ureteral  & renal stone   . Lumbago   . Mixed hyperlipidemia   . Personal history of COVID-19 08/10/2021   Paxlovid  . Plantar fascial fibromatosis   . Postoperative atrial fibrillation (HCC)   . Skin Cancer (HCC)   . Use of cane as ambulatory aid   . Uses roller walker   . UTI (urinary tract infection) 09/16/2021  . Wears glasses      Medicines No orders of the defined types were placed in this encounter.   I have reviewed the patients home medicines and have made adjustments as needed  Problem List / ED  Course: Problem List Items Addressed This Visit   None        {Document critical care time when appropriate:1} {Document review of labs and clinical decision tools ie heart score, Chads2Vasc2 etc:1}  {Document your independent review of radiology images, and any outside records:1} {Document your discussion with family members, caretakers, and with consultants:1} {Document social determinants of health affecting pt's care:1} {Document your decision making why or why not admission, treatments were needed:1}  This note was created using dictation software, which may contain spelling or grammatical errors.

## 2023-08-04 DIAGNOSIS — S46012S Strain of muscle(s) and tendon(s) of the rotator cuff of left shoulder, sequela: Secondary | ICD-10-CM | POA: Diagnosis not present

## 2023-08-04 DIAGNOSIS — M25512 Pain in left shoulder: Secondary | ICD-10-CM | POA: Diagnosis not present

## 2023-08-18 DIAGNOSIS — N210 Calculus in bladder: Secondary | ICD-10-CM | POA: Diagnosis not present

## 2023-08-18 DIAGNOSIS — R310 Gross hematuria: Secondary | ICD-10-CM | POA: Diagnosis not present

## 2023-08-18 DIAGNOSIS — R350 Frequency of micturition: Secondary | ICD-10-CM | POA: Diagnosis not present

## 2023-08-31 DIAGNOSIS — M25512 Pain in left shoulder: Secondary | ICD-10-CM | POA: Diagnosis not present

## 2023-09-07 DIAGNOSIS — M25512 Pain in left shoulder: Secondary | ICD-10-CM | POA: Diagnosis not present

## 2023-09-12 ENCOUNTER — Telehealth: Payer: Self-pay | Admitting: Diagnostic Neuroimaging

## 2023-09-12 NOTE — Telephone Encounter (Signed)
Wife called to clarify that this upcoming appointment needs to be in office, they never requested or agreed to a my chart vv appointment

## 2023-09-14 DIAGNOSIS — M25512 Pain in left shoulder: Secondary | ICD-10-CM | POA: Diagnosis not present

## 2023-09-19 ENCOUNTER — Ambulatory Visit (INDEPENDENT_AMBULATORY_CARE_PROVIDER_SITE_OTHER): Payer: Medicare Other | Admitting: Diagnostic Neuroimaging

## 2023-09-19 ENCOUNTER — Encounter: Payer: Self-pay | Admitting: Diagnostic Neuroimaging

## 2023-09-19 VITALS — BP 124/64 | HR 59 | Ht 72.0 in | Wt 184.4 lb

## 2023-09-19 DIAGNOSIS — G7000 Myasthenia gravis without (acute) exacerbation: Secondary | ICD-10-CM

## 2023-09-19 MED ORDER — PYRIDOSTIGMINE BROMIDE 60 MG PO TABS: 30.0000 mg | ORAL_TABLET | Freq: Two times a day (BID) | ORAL | 4 refills | Status: AC

## 2023-09-19 NOTE — Progress Notes (Signed)
[facility]  [name] DOB: 1946-02-20  REFERRING CLINICIAN: Assunta Found, MD HISTORY FROM: patient and wife  REASON FOR VISIT: follow up    HISTORICAL  CHIEF COMPLAINT:  Chief Complaint  Patient presents with   Follow-up    Pt in room 6. Wife in room. Here for 1 year follow up for Myasthenia gravis. Pt reports doing well. No concerns.    HISTORY OF PRESENT ILLNESS:   UPDATE (09/19/23, VRP): Since last visit, doing well. Symptoms are stable. No alleviating or aggravating factors. Tolerating pyridostigmine 30mg  twice a day.    UPDATE (09/16/22, VRP): Since last visit, doing well. Symptoms are stable. Severity  Tolerating meds.  No MG sxs.   UPDATE (09/01/21, VRP): Since last visit, now off prednisone. MG stable. No ptosis or generalized weakness. Still with significant low back pain and hip pain. Had L1-2 kyphoplasty in Aug 2022.   UPDATE (02/18/21, VRP): Since last visit, doing well from MG symptoms. However prednisone side effects (weight gain, irritable) are getting worse. Lumbar spinal stenosis is worsening, and maybe heading towards surgery with Dr. Venetia Maxon.  UPDATE (08/20/20, VRP): Since last visit, doing well with myasthenia gravis --> symptoms are stable. Also seeing Dr. Despina Hick, Dr. Shon Baton, Dr. Ethelene Hal for lumbar spinal stenosis. Had more low back pain (starting Aug 2021), few months of loose stools, frequent urination. No saddle anesthesia.  UPDATE (03/18/20, VRP): Since last visit, doing well from MG. Some side effects from prednisone (jittery, bloating). Symptoms are stable. Severity is mild. No alleviating or aggravating factors. Tolerating meds otherwise.   UPDATE (01/08/20, VRP): Since last visit, doing WELL. Symptoms are stable. No alleviating or aggravating factors. Tolerating meds. New left hip stress fracture since Dec 28, 2019. Using walker.   UPDATE (11/14/19, VRP): Since last visit, doing WELL; s/p IVIG in the hospital. Now on prednisone  60mg  daily. Symptoms are improved. No alleviating or aggravating factors. Tolerating meds.    UPDATE (10/31/19, VRP): Since last visit, doing poorly. Now with fatigue, SOB, hand weakness, leg weakness. Mestinon not helping (even up to 90mg  three times a day).  PRIOR HPI (09/24/19): 78 year old male here for evaluation of myasthenia gravis.  September 10, 2019 patient noticed right eye ptosis, progressing to left eye ptosis.  He was having some stomach pain, diarrhea, gas, and breathing issues around the same time.  [name], had ACH R antibody testing which was positive and diagnosed with myasthenia gravis.  Around the same time patient was with a GI doctor, diagnosed with possible bacterial infection and treated with antibiotics.  He was also noted to be heme positive stool.  Since that time eye symptoms are stable.  Symptoms are worse in the evening compared the morning.  [name].  Denies any shortness of breath.  Denies any speech or swallowing problems.  Eye drooping is severe enough that sometimes it obstructs his vision and he is not able to drive currently.   REVIEW OF SYSTEMS: Full 14 system review of systems performed and negative with exception of: as per HPI.    ALLERGIES: Allergies  Allergen Reactions   Gabapentin Other (See Comments)    Hallucinations    HOME MEDICATIONS: Outpatient Medications Prior to Visit  Medication Sig Dispense Refill   allopurinol (ZYLOPRIM) 300 MG tablet Take 150 mg by mouth in the morning.     amLODipine (NORVASC) 5 MG tablet Take 1 tablet (5 mg total) by mouth daily.  30 tablet 1   Ascorbic Acid (VITAMIN C) 1000 MG tablet Take 1,000 mg by mouth in the morning.     aspirin EC 81 MG tablet Take 81 mg by mouth daily. Swallow whole.     Calcium Carb-Cholecalciferol (CALCIUM 600+D3 PO) Take 1 tablet by mouth in the morning and at bedtime.     finasteride (PROSCAR) 5 MG tablet Take 5 mg by mouth daily.      metoprolol tartrate (LOPRESSOR) 25 MG tablet Take 1/2 (one-half) tablet by mouth twice daily 90 tablet 1   Misc Natural Products (OSTEO BI-FLEX ADV TRIPLE ST) TABS Take 1 tablet by mouth in the morning.     Multiple Vitamin (MULTIVITAMIN) tablet Take 1 tablet by mouth daily.     Omega-3 Fatty Acids (FISH OIL) 500 MG CAPS Take 500 mg by mouth in the morning.     pantoprazole (PROTONIX) 40 MG tablet Take 40 mg by mouth daily as needed (acid reflux).     pravastatin (PRAVACHOL) 40 MG tablet Take 40 mg by mouth every evening.      pyridostigmine (MESTINON) 60 MG tablet Take 0.5 tablets (30 mg total) by mouth 3 (three) times daily. (Patient taking differently: Take 30 mg by mouth in the morning and at bedtime.) 135 tablet 4   tamsulosin (FLOMAX) 0.4 MG CAPS capsule Take 1 capsule by mouth daily. (Patient not taking: Reported on 09/19/2023)     traMADol (ULTRAM) 50 MG tablet Take 1-2 tablets (50-100 mg total) by mouth every 6 (six) hours as needed for moderate pain. (Patient not taking: Reported on 09/19/2023) 40 tablet 0   No facility-administered medications prior to visit.    PAST MEDICAL HISTORY: Past Medical History:  Diagnosis Date   Anemia    Arthritis    Coronary atherosclerosis of native coronary artery    Multivessel status post CABG March 2015   DDD (degenerative disc disease), lumbar    Essential hypertension    GERD (gastroesophageal reflux disease)    Hard of hearing both ears    Left ear is worst   Hemorrhoids    History of gout    History of kidney stones    history of myasthenia Gravis 2021   History of [facility] spotted fever    Left ureteral  & renal stone    Lumbago    Mixed hyperlipidemia    Personal history of COVID-19 08/10/2021   Paxlovid   Plantar fascial fibromatosis    Postoperative atrial fibrillation (HCC)    Skin Cancer (HCC)    Use of cane as ambulatory aid    Uses roller walker    UTI (urinary tract infection) 09/16/2021   Wears glasses     PAST  SURGICAL HISTORY: Past Surgical History:  Procedure Laterality Date   AMPUTATION Right 03/27/2014   Procedure: DEBRIDEMENT AND CLOSURE RIGHT INDEX FINGER;  Surgeon: Sharma Covert, MD;  Location: [facility] OR;  Service: Orthopedics;  Laterality: Right;   BACK SURGERY  12/2021   BICEPT TENODESIS  11/26/2011   Procedure: BICEPT TENODESIS;  Surgeon: Verlee Rossetti, MD;  Location: [facility] OR;  Service: Orthopedics;  Laterality: Left;   BIOPSY  08/03/2018   Procedure: BIOPSY;  Surgeon: Malissa Hippo, MD;  Location: [facility];  Service: Endoscopy;;  antral   Cataract surgery Bilateral    yrs ago per wife on 10-01-2021   CHOLECYSTECTOMY N/A 09/22/2018   Procedure: LAPAROSCOPIC CHOLECYSTECTOMY;  Surgeon: Lucretia Roers, MD;  Location: [facility] ORS;  Service:  General;  Laterality: N/A;   COLONOSCOPY N/A 12/26/2014   Procedure: COLONOSCOPY;  Surgeon: Malissa Hippo, MD;  [facility]: [facility];  Service: Endoscopy;  Laterality: N/A;  730   CORONARY ARTERY BYPASS GRAFT N/A 10/19/2013   Procedure: CORONARY ARTERY BYPASS GRAFTING (CABG);  Surgeon: Purcell Nails, MD;  Location: [facility] OR;  Service: Open Heart Surgery;  Laterality: N/A;  CABG times four using left internal mammary artery and left leg saphenous vein, incision made on right leg but no vein removed   CYSTOSCOPY W/ URETERAL STENT PLACEMENT Right 02/22/2022   Procedure: CYSTOSCOPY WITH RETROGRADE PYELOGRAM/URETERAL STENT PLACEMENT;  Surgeon: Rene Paci, MD;  Location: [facility] ORS;  Service: Urology;  Laterality: Right;   CYSTOSCOPY WITH RETROGRADE PYELOGRAM, URETEROSCOPY AND STENT PLACEMENT Left 08/24/2016   Procedure: CYSTOSCOPY WITH LEFT RETROGRADE PYELOGRAM, LEFT URETEROSCOPY, BASKET EXTRACTION LEFT URETERAL STONE;  Surgeon: Bjorn Pippin, MD;  Location: [facility];  Service: Urology;  Laterality: Left;   CYSTOSCOPY WITH RETROGRADE PYELOGRAM, URETEROSCOPY AND STENT PLACEMENT Bilateral 07/12/2023   Procedure: CYSTOSCOPY WITH BILATERAL RETROGRADE  RIGHT URETEROSCOPY; REMOVAL OF FOREIGN BODIES COMPLEX WITH FULGURATION;  Surgeon: Bjorn Pippin, MD;  Location: [facility];  Service: Urology;  Laterality: Bilateral;  1 HR FOR CASE   CYSTOSCOPY/URETEROSCOPY/HOLMIUM LASER/STENT PLACEMENT Left 10/06/2021   Procedure: CYSTOSCOPY LEFT RETROGRADE PYELOGRAM URETEROSCOPY/HOLMIUM LASER/STENT PLACEMENT;  Surgeon: Bjorn Pippin, MD;  Location: [facility];  Service: Urology;  Laterality: Left;   CYSTOSCOPY/URETEROSCOPY/HOLMIUM LASER/STENT PLACEMENT Right 03/01/2022   Procedure: CYSTOSCOPY RIGHT URETEROSCOPY/HOLMIUM LASER/STENT PLACEMENT;  Surgeon: Bjorn Pippin, MD;  Location: [facility];  Service: Urology;  Laterality: Right;   ESOPHAGOGASTRODUODENOSCOPY N/A 08/03/2018   Procedure: ESOPHAGOGASTRODUODENOSCOPY (EGD);  Surgeon: Malissa Hippo, MD;  [facility];  Service: Endoscopy;  Laterality: N/A;  2:55   EXTRACORPOREAL SHOCK WAVE LITHOTRIPSY Right 06/18/2019   Procedure: EXTRACORPOREAL SHOCK WAVE LITHOTRIPSY (ESWL);  Surgeon: Crista Elliot, MD;  Location: [facility];  Service: Urology;  Laterality: Right;   INTRAOPERATIVE TRANSESOPHAGEAL ECHOCARDIOGRAM N/A 10/19/2013   Procedure: INTRAOPERATIVE TRANSESOPHAGEAL ECHOCARDIOGRAM;  Surgeon: Purcell Nails, MD;  Location: [place] OR;  Service: Open Heart Surgery;  Laterality: N/A;   KYPHOPLASTY N/A 03/27/2021   Procedure: Lumbar One and Lumbar Two Kyphoplasty;  Surgeon: Maeola Harman, MD;  [facility] OR;  Service: Neurosurgery;  Laterality: N/A;   left hand carpal tunnel release  04/04/2015   LEFT HEART CATHETERIZATION WITH CORONARY ANGIOGRAM N/A 10/09/2013   Procedure: LEFT HEART CATHETERIZATION WITH CORONARY ANGIOGRAM;  Surgeon: Kathleene Hazel, MD;  Location: [facility] CATH LAB;  Service: Cardiovascular;  Laterality: N/A;   Left shoulder rotator cuff repair  12/04/2011   MOHS procedure  01/06/2021   dr Modena Jansky on left hand   ORIF PERIPROSTHETIC FRACTURE Left 03/19/2020   Procedure: OPEN  REDUCTION INTERNAL FIXATION (ORIF) PERIPROSTHETIC FRACTURE LEFT FEMUR WITH FEMORAL REVISION;  Surgeon: Ollen Gross, MD;  Location: [facility];  Service: Orthopedics;  Laterality: Left;   REVERSE SHOULDER ARTHROPLASTY Right 10/08/2022   Procedure: REVERSE SHOULDER ARTHROPLASTY;  Surgeon: Beverely Low, MD;  Location: [facility];  Service: Orthopedics;  Laterality: Right;  general, choice with interscalene block   TOTAL HIP ARTHROPLASTY Left 01/28/2020   Procedure: TOTAL HIP ARTHROPLASTY ANTERIOR APPROACH;  Surgeon: Ollen Gross, MD;  Location: [facility];  Service: Orthopedics;  Laterality: Left;    TOTAL KNEE ARTHROPLASTY Left 04/18/2001   TOTAL KNEE ARTHROPLASTY Right 05/02/2023   Procedure: TOTAL KNEE ARTHROPLASTY;  Surgeon: Ollen Gross, MD;  Location: [facility];  Service: Orthopedics;  Laterality: Right;   traumaticvamputation of finger  2015   VASECTOMY     yrs ago per wife on 10-01-2021    FAMILY HISTORY: Family History  Problem Relation Age of Onset   Hypertension Sister    Lung cancer Father    Heart failure Mother     SOCIAL HISTORY: Social History   Socioeconomic History   Marital status: Married    Spouse name: [name]   Number of children: 2   Years of education: Not on file   Highest education level: 10th grade  Occupational History   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 [employer]  Tobacco Use   Smoking status: Former    Current packs/day: 0.00    Average packs/day: 2.0 packs/day for 30.0 years (60.0 ttl pk-yrs)    Types: Cigarettes    Start date: 08/16/1958    Quit date: 08/17/1983    Years since quitting: 40.1   Smokeless tobacco: Former    Types: Chew    Quit date: 1985   Tobacco comments:    quit smoking 66yrs ago  Vaping Use   Vaping status: Never Used  Substance and Sexual Activity   Alcohol use: Not Currently    Comment: none in 4 years   Drug use: No   Sexual activity: Not on file  Other Topics Concern   Not on file  Social History Narrative   Lives with wife    Caffeine- coffee 3- 4 c daily   Social Drivers of Health   Financial Resource Strain: Low Risk  (04/21/2022)   Overall Financial Resource Strain (CARDIA)    Difficulty of Paying Living Expenses: Not hard at all  Food Insecurity: No Food Insecurity (05/02/2023)   Hunger Vital Sign    Worried About Running Out of Food in the Last Year: Never true    Ran Out of Food in the Last Year: Never true  Transportation Needs: No Transportation Needs (05/02/2023)   PRAPARE - Administrator, Civil Service (Medical): No    Lack of Transportation (Non-Medical): No  Physical Activity: Not on file  Stress: Not on file  Social Connections: Not on file  Intimate Partner Violence: Not At Risk (05/02/2023)   Humiliation, Afraid, Rape, and Kick questionnaire    Fear of Current or Ex-Partner: No    Emotionally Abused: No    Physically Abused: No    Sexually Abused: No     PHYSICAL EXAM  GENERAL EXAM/CONSTITUTIONAL: Vitals:  Vitals:   09/19/23 1418  BP: 124/64  Pulse: (!) 59  Weight: 184 lb 6.4 oz (83.6 kg)  Height: 6' (1.829 m)   Body mass index is 25.01 kg/m. Wt Readings from Last 10 Encounters:  09/19/23 184 lb 6.4 oz (83.6 kg)  07/31/23 182 lb (82.6 kg)  07/27/23 184 lb (83.5 kg)  07/12/23 180 lb 1.9 oz (81.7 kg)  07/07/23 180 lb 3.2 oz (81.7 kg)  05/02/23 190 lb 9.6 oz (86.5 kg)  04/21/23 190 lb 9.6 oz (86.5 kg)  01/26/23 185 lb 3.2 oz (84 kg)  10/08/22 180 lb (81.6 kg)  09/24/22 180 lb (81.6 kg)   Patient is in no distress; well developed, nourished and groomed; neck is supple MOON FACIES  CARDIOVASCULAR: Examination of carotid arteries is normal; no carotid bruits Regular rate and rhythm, no murmurs Examination of peripheral vascular system by observation and palpation is normal  EYES: Ophthalmoscopic exam of optic discs and posterior segments is normal; no papilledema or hemorrhages No results  found.  MUSCULOSKELETAL: Gait, strength, tone, movements noted in  Neurologic exam below  NEUROLOGIC: MENTAL STATUS:      No data to display         awake, alert, oriented to person, place and time recent and remote memory intact normal attention and concentration language fluent, comprehension intact, naming intact fund of knowledge appropriate  CRANIAL NERVE:  2nd - no papilledema on fundoscopic exam 2nd, 3rd, 4th, 6th - pupils equal and reactive to light, visual fields full to confrontation, extraocular muscles intact, no nystagmus 5th - facial sensation symmetric 7th - facial strength symmetric 8th - hearing --> REDUCED 9th - palate elevates symmetrically, uvula midline 11th - shoulder shrug symmetric 12th - tongue protrusion midline   MOTOR:  normal bulk and tone BUE 5; BLE --> RIGHT HF 4+, LEFT HF 3, LEFT DF 4  SENSORY:  normal and symmetric to light touch, temperature, vibration  COORDINATION:  finger-nose-finger, fine finger movements normal  REFLEXES:  deep tendon reflexes TRACE and symmetric  GAIT/STATION:  narrow based gait; USING WALKER     DIAGNOSTIC DATA (LABS, IMAGING, TESTING) - I reviewed patient records, labs, notes, testing and imaging myself where available.  Lab Results  Component Value Date   WBC 6.2 07/07/2023   HGB 13.2 07/07/2023   HCT 42.6 07/07/2023   MCV 92.8 07/07/2023   PLT 229 07/07/2023      Component Value Date/Time   NA 136 07/07/2023 1251   NA 138 06/28/2019 0000   K 3.8 07/07/2023 1251   CL 105 07/07/2023 1251   CO2 25 07/07/2023 1251   GLUCOSE 104 (H) 07/07/2023 1251   BUN 13 07/07/2023 1251   BUN 20 06/28/2019 0000   CREATININE 0.92 07/07/2023 1251   CALCIUM 9.3 07/07/2023 1251   PROT 7.1 08/10/2021 2125   ALBUMIN 3.6 08/10/2021 2125   AST 22 08/10/2021 2125   ALT 20 08/10/2021 2125   ALKPHOS 106 08/10/2021 2125   BILITOT 1.2 08/10/2021 2125   GFRNONAA >60 07/07/2023 1251   GFRAA >60 03/19/2020 1218   Lab Results  Component Value Date   CHOL 134 06/28/2019   HDL 49  06/28/2019   LDLCALC 62 06/28/2019   TRIG 157 06/28/2019   CHOLHDL 2.4 12/06/2013   Lab Results  Component Value Date   HGBA1C 6.0 (H) 03/20/2020   No results found for: "VITAMINB12" Lab Results  Component Value Date   TSH 0.981 03/21/2020    09/17/19 AchR ab - 42 HIGH  10/15/19 CT chest 1. No thymic mass or thymoma. 2.  Aortic Atherosclerosis (ICD10-I70.0). 3. 4.5 mm left lower lobe pulmonary nodule. This is unchanged compared with 04/12/2018. No follow-up needed if patient is low-risk. Non-contrast chest CT can be considered in 12 months if patient is high-risk. This recommendation follows the consensus statement: Guidelines for Management of Incidental Pulmonary Nodules Detected on CT Images: From the Fleischner Society 2017; Radiology 2017; 161:096-045.  07/10/09 MRI lumbar spine 1.  Moderately severe spinal stenosis at L4-5 due to severe  bilateral facet joint arthritis and a 6 mm spondylolisthesis of L4  on L5.  2.  Diffuse bulging of the uncovered L4-5 disc with a herniation of  disc material into the right neural foramen compressing the right  L4 nerve root under the right pedicle.  3.  Severe right lateral recess stenosis at L4-5.  The right L5  nerve root appears compressed by the posterior element hypertrophy  in the right lateral recess.   03/20/21 MRI  lumbar spine 1. Acute or subacute L1 and L2 compression fractures. 2. Slight progression of severe spinal and left lateral recess stenosis at L2-3.   ASSESSMENT AND PLAN  78 y.o. year old male here with new onset ptosis, blurred vision, generalized weakness with positive ACH antibody, consistent with generalized myasthenia gravis.    Dx:  1. Myasthenia gravis without (acute) exacerbation (HCC)     PLAN:  GENERALIZED MYASTHENIA GRAVIS (stable) - continue pyridostigmine 30mg  2 times a day - continue vitamin D + calcium supplement  LUMBAR SPINAL STENOSIS (s/p surgery May 2023) - improved; monitor  Meds  ordered this encounter  Medications   pyridostigmine (MESTINON) 60 MG tablet    Sig: Take 0.5 tablets (30 mg total) by mouth in the morning and at bedtime.    Dispense:  90 tablet    Refill:  4   Return in about 1 year (around 09/18/2024) for MyChart visit (15 min).    Suanne Marker, MD 09/19/2023, 2:38 PM Certified in Neurology, Neurophysiology and Neuroimaging  [facility] [address], [address] [place], [place] [address] [phone]

## 2023-09-21 DIAGNOSIS — M25512 Pain in left shoulder: Secondary | ICD-10-CM | POA: Diagnosis not present

## 2023-09-26 DIAGNOSIS — G7000 Myasthenia gravis without (acute) exacerbation: Secondary | ICD-10-CM | POA: Diagnosis not present

## 2023-09-26 DIAGNOSIS — I2510 Atherosclerotic heart disease of native coronary artery without angina pectoris: Secondary | ICD-10-CM | POA: Diagnosis not present

## 2023-09-26 DIAGNOSIS — R7303 Prediabetes: Secondary | ICD-10-CM | POA: Diagnosis not present

## 2023-09-26 DIAGNOSIS — I10 Essential (primary) hypertension: Secondary | ICD-10-CM | POA: Diagnosis not present

## 2023-09-26 DIAGNOSIS — E119 Type 2 diabetes mellitus without complications: Secondary | ICD-10-CM | POA: Diagnosis not present

## 2023-09-26 DIAGNOSIS — Z6826 Body mass index (BMI) 26.0-26.9, adult: Secondary | ICD-10-CM | POA: Diagnosis not present

## 2023-09-26 DIAGNOSIS — E782 Mixed hyperlipidemia: Secondary | ICD-10-CM | POA: Diagnosis not present

## 2023-09-26 DIAGNOSIS — Z0001 Encounter for general adult medical examination with abnormal findings: Secondary | ICD-10-CM | POA: Diagnosis not present

## 2023-09-27 ENCOUNTER — Ambulatory Visit: Payer: Medicare Other | Attending: Cardiology | Admitting: Cardiology

## 2023-09-27 ENCOUNTER — Encounter: Payer: Self-pay | Admitting: Cardiology

## 2023-09-27 VITALS — BP 120/72 | HR 50

## 2023-09-27 DIAGNOSIS — R001 Bradycardia, unspecified: Secondary | ICD-10-CM | POA: Insufficient documentation

## 2023-09-27 DIAGNOSIS — R42 Dizziness and giddiness: Secondary | ICD-10-CM | POA: Insufficient documentation

## 2023-09-27 DIAGNOSIS — E782 Mixed hyperlipidemia: Secondary | ICD-10-CM | POA: Insufficient documentation

## 2023-09-27 DIAGNOSIS — I25119 Atherosclerotic heart disease of native coronary artery with unspecified angina pectoris: Secondary | ICD-10-CM | POA: Diagnosis not present

## 2023-09-27 DIAGNOSIS — I10 Essential (primary) hypertension: Secondary | ICD-10-CM | POA: Insufficient documentation

## 2023-09-27 NOTE — Patient Instructions (Addendum)
Medication Instructions:  Your physician has recommended you make the following change in your medication:  Stop metoprolol tartrate Continue all other medications as prescribed  Labwork: none  Testing/Procedures: none  Follow-Up: Your physician recommends that you schedule a follow-up appointment in: 3 months  Any Other Special Instructions Will Be Listed Below (If Applicable). Please contact our office in 1-2 days with an update on your symptoms.  If you have worsening symptoms such as syncope, please go the ED for an evaluation.   If you need a refill on your cardiac medications before your next appointment, please call your pharmacy.

## 2023-09-27 NOTE — Progress Notes (Signed)
    Cardiology Office Note  Date: 09/27/2023   ID: Brandon [name], DOB September 15, 1945, MRN [id number]  History of Present Illness: Brandon [name] is a 78 y.o. male last seen in December 2024.  He presents back to the office with his wife, saw Dr. Phillips Odor yesterday and it was recommended that he ACS.  States that he has been having episodes of lightheadedness, nausea, dizziness intermittently within the last week.  No frank syncope or chest pain.  This does not occur with any specific pattern, not orthostatic in description.  He has had no recent medication changes.  I reviewed his medications.  Current regimen includes aspirin, Norvasc, Lopressor, Pravachol, and omega-3 supplements.  Orthostatic measurements were negative today, systolics staying in the 140s to 150s, heart rate in the 40s to 50s.  His initial resting blood pressure was 120/72.  I reviewed his ECG today which shows sinus bradycardia at 47 bpm.  Physical Exam: VS:  BP 120/72   Pulse (!) 50   SpO2 96% , BMI There is no height or weight on file to calculate BMI.  Wt Readings from Last 3 Encounters:  09/19/23 184 lb 6.4 oz (83.6 kg)  07/31/23 182 lb (82.6 kg)  07/27/23 184 lb (83.5 kg)    General: Patient appears comfortable at rest. HEENT: Conjunctiva and lids normal. Neck: Supple, no elevated JVP or carotid bruits. Lungs: Clear to auscultation, nonlabored breathing at rest. Cardiac: Regular rate and rhythm, no S3 or significant systolic murmur. Extremities: No pitting edema.  ECG:  An ECG dated 01/26/2023 was personally reviewed today and demonstrated:  Sinus bradycardia.  Labwork: 07/07/2023: BUN 13; Creatinine, Ser 0.92; Hemoglobin 13.2; Platelets 229; Potassium 3.8; Sodium 136  February 2025: Cholesterol 154, triglycerides 93, HDL 71, LDL 66, TSH 1.78  Other Studies Reviewed Today:  No interval cardiac testing for review today.  Assessment and Plan:  1.  Intermittent lightheadedness and nausea, no frank  syncope or chest pain.  He was not orthostatic today, but is significantly bradycardic with heart rate in the 40s to 50s.  Plan to stop Lopressor completely.  ECG reviewed.  If he does not feel better within the next few days, plan 72-hour Zio patch.  2.  Multivessel CAD status post CABG in 2015 with LIMA to LAD, SVG to PDA, and sequential SVG to ramus intermedius and OM.  Lexiscan Myoview in February 2023 showed a small inferior/inferoseptal infarct scar with mild peri-infarct ischemia and LVEF 57%.  No obvious angina in the setting of recent symptoms.  Continue aspirin, Norvasc, and Pravachol otherwise.   3.  Mixed hyperlipidemia.  Recent LDL 66.  Continue Pravachol.   4.  Primary hypertension.  Continue Norvasc, stopping Lopressor as discussed above.  Disposition:  Follow up  3 months.  Signed, Jonelle Sidle, M.D., F.A.C.C. [facility]

## 2023-09-28 DIAGNOSIS — M180 Bilateral primary osteoarthritis of first carpometacarpal joints: Secondary | ICD-10-CM | POA: Diagnosis not present

## 2023-09-28 DIAGNOSIS — M19021 Primary osteoarthritis, right elbow: Secondary | ICD-10-CM | POA: Diagnosis not present

## 2023-09-29 ENCOUNTER — Ambulatory Visit: Payer: Medicare Other | Attending: Student | Admitting: Student

## 2023-09-29 ENCOUNTER — Ambulatory Visit: Payer: Medicare Other

## 2023-09-29 ENCOUNTER — Encounter: Payer: Self-pay | Admitting: Student

## 2023-09-29 ENCOUNTER — Telehealth: Payer: Self-pay | Admitting: Cardiology

## 2023-09-29 ENCOUNTER — Ambulatory Visit: Payer: Medicare Other | Admitting: Student

## 2023-09-29 VITALS — BP 132/78 | HR 76 | Ht 73.0 in | Wt 187.0 lb

## 2023-09-29 DIAGNOSIS — R002 Palpitations: Secondary | ICD-10-CM | POA: Insufficient documentation

## 2023-09-29 DIAGNOSIS — I493 Ventricular premature depolarization: Secondary | ICD-10-CM | POA: Insufficient documentation

## 2023-09-29 DIAGNOSIS — R001 Bradycardia, unspecified: Secondary | ICD-10-CM

## 2023-09-29 DIAGNOSIS — I10 Essential (primary) hypertension: Secondary | ICD-10-CM | POA: Diagnosis present

## 2023-09-29 DIAGNOSIS — I2510 Atherosclerotic heart disease of native coronary artery without angina pectoris: Secondary | ICD-10-CM | POA: Diagnosis present

## 2023-09-29 DIAGNOSIS — E782 Mixed hyperlipidemia: Secondary | ICD-10-CM | POA: Insufficient documentation

## 2023-09-29 DIAGNOSIS — Z79899 Other long term (current) drug therapy: Secondary | ICD-10-CM | POA: Insufficient documentation

## 2023-09-29 NOTE — Patient Instructions (Signed)
Medication Instructions:  Your physician recommends that you continue on your current medications as directed. Please refer to the Current Medication list given to you today.  *If you need a refill on your cardiac medications before your next appointment, please call your pharmacy*   Lab Work: Your physician recommends that you return for lab work today. ( CBC, BMET, Mg)   If you have labs (blood work) drawn today and your tests are completely normal, you will receive your results only by: MyChart Message (if you have MyChart) OR A paper copy in the mail If you have any lab test that is abnormal or we need to change your treatment, we will call you to review the results.   Testing/Procedures: Your physician has requested that you have an echocardiogram. Echocardiography is a painless test that uses sound waves to create images of your heart. It provides your doctor with information about the size and shape of your heart and how well your heart's chambers and valves are working. This procedure takes approximately one hour. There are no restrictions for this procedure. Please do NOT wear cologne, perfume, aftershave, or lotions (deodorant is allowed). Please arrive 15 minutes prior to your appointment time.  Please note: We ask at that you not bring children with you during ultrasound (echo/ vascular) testing. Due to room size and safety concerns, children are not allowed in the ultrasound rooms during exams. Our front office staff cannot provide observation of children in our lobby area while testing is being conducted. An adult accompanying a patient to their appointment will only be allowed in the ultrasound room at the discretion of the ultrasound technician under special circumstances. We apologize for any inconvenience.    Follow-Up: At [facility], you and your health needs are our priority.  As part of our continuing mission to provide you with exceptional heart care, we have  created designated Provider Care Teams.  These Care Teams include your primary Cardiologist (physician) and Advanced Practice Providers (APPs -  Physician Assistants and Nurse Practitioners) who all work together to provide you with the care you need, when you need it.  We recommend signing up for the patient portal called "MyChart".  Sign up information is provided on this After Visit Summary.  MyChart is used to connect with patients for Virtual Visits (Telemedicine).  Patients are able to view lab/test results, encounter notes, upcoming appointments, etc.  Non-urgent messages can be sent to your provider as well.   To learn more about what you can do with MyChart, go to [email or web address].    Your next appointment:    May   Provider:   You may see Nona Dell, MD or one of the following Advanced Practice Providers on your designated Care Team:   Randall An, PA-C  Jacolyn Reedy, PA-C     Other Instructions Thank you for choosing [facility]!

## 2023-09-29 NOTE — Telephone Encounter (Signed)
Pt spouse called in stating she was told to c/b in 1-2 day to report symptoms. She states "he is still weak in his chest, his breathing is not right, his HR is still skipping" She states he is still have the same symptoms from when he was in the office. Please advise.

## 2023-09-29 NOTE — Progress Notes (Signed)
Cardiology Office Note    Date:  09/29/2023  ID:  Brandon [name], DOB 12-22-1945, MRN [id number] Cardiologist: Nona Dell, MD    History of Present Illness:    Brandon [name] is a 78 y.o. male with past medical history of CAD (s/p CABG in 2015 with LIMA-LAD, SVG-PDA and seq SVG-RI-OM, NST in 09/2021 showing very mild ischemia and a low-risk study), postoperative atrial fibrillation (occurring in 03/2020 following hip surgery), HTN, HLD, GERD, myasthenia gravis, BPH and gout who presents to the office today for evaluation persistent weakness and palpitations.  He was examined by Dr. Diona Browner on 09/27/2023 and reported worsening episodes of lightheadedness, dizziness and nausea for the past week. There were no recent medication changes. EKG that day did show sinus bradycardia with heart rate at 47 bpm. He had been taking Lopressor 12.5 mg twice daily and this was discontinued. Was recommended to obtain a Zio patch if symptoms did not improve.  His wife called the office today reporting worsening symptoms, therefore he was added on for an acute visit. By review of [facility], he did have follow-up labs with his PCP on 09/26/2023. TSH was checked and normal at 1.78. FLP showed his LDL at 66. It appears an albumin/creatinine ratio and PSA were obtained but no additional labs.  In talking with the patient and his wife today, he reports that his dizziness did improve with stopping Lopressor but starting yesterday, he noted more frequent palpitations. Reports he can feel his heart skipping beats at times. Notices this mostly when checking his radial pulse. He denies any specific chest pain or progressive dyspnea on exertion.  No recent orthopnea, PND or pitting edema. He does consume a few caffeinated beverages a day but no acute changes in this. Only has an occasional beer.  Studies Reviewed:   EKG: EKG is ordered today and demonstrates:   EKG Interpretation Date/Time:  Thursday September 29 2023  12:54:10 EST Ventricular Rate:  76 PR Interval:  160 QRS Duration:  104 QT Interval:  390 QTC Calculation: 438 R Axis:   78  Text Interpretation: Sinus rhythm with frequent Premature ventricular complexes and Premature atrial complexes Premature ventricular complexes are now Present Premature atrial complexes are now Present T wave inversion now evident in Inferior leads Confirmed by Randall An ([id number]) on 09/29/2023 1:00:41 PM       Echocardiogram: 03/2020 IMPRESSIONS     1. Left ventricular ejection fraction, by estimation, is 50 to 55%. The  left ventricle has low normal function. The left ventricle has no regional  wall motion abnormalities. There is mild left ventricular hypertrophy of  the basal-septal segment. Left  ventricular diastolic function could not be evaluated.   2. Right ventricular systolic function is normal. The right ventricular  size is normal. Tricuspid regurgitation signal is inadequate for assessing  PA pressure.   3. Left atrial size was severely dilated.   4. The mitral valve is normal in structure. Trivial mitral valve  regurgitation. No evidence of mitral stenosis.   5. The aortic valve is tricuspid. Aortic valve regurgitation is not  visualized. Mild aortic valve sclerosis is present, with no evidence of  aortic valve stenosis.   6. The inferior vena cava is normal in size with greater than 50%  respiratory variability, suggesting right atrial pressure of 3 mmHg.    NST: 09/2021   Findings are consistent with small prior inferior/inferoseptal myocardial infarction with very mild peri-infarct ischemia. The study is low risk.  No ST deviation was noted.   There is a small mild intensity inferior/inferoseptal defect with very mild reversibility.   Left ventricular function is normal. Nuclear stress EF: 57 %. The left ventricular ejection fraction is normal (55-65%). End diastolic cavity size is normal.   Physical Exam:   VS:  BP 132/78    Pulse 76   Ht 6\' 1"  (1.854 m)   Wt 187 lb (84.8 kg)   SpO2 95%   BMI 24.67 kg/m    Wt Readings from Last 3 Encounters:  09/29/23 187 lb (84.8 kg)  09/19/23 184 lb 6.4 oz (83.6 kg)  07/31/23 182 lb (82.6 kg)     GEN: Well nourished, well developed male appearing in no acute distress NECK: No JVD; No carotid bruits CARDIAC: RRR with occasional ectopic beats.  No significant murmurs. RESPIRATORY:  Clear to auscultation without rales, wheezing or rhonchi  ABDOMEN: Appears non-distended. No obvious abdominal masses. EXTREMITIES: No clubbing or cyanosis. No pitting edema.  Distal pedal pulses are 2+ bilaterally.   Assessment and Plan:   1. Palpitations/PVC's - He was previously on Lopressor and this was discontinued earlier in the week due to bradycardia as discussed above and while his dizziness did improve, he has noted more frequent palpitations and EKG today shows PAC's and PVC's.  He reports occasional palpitations but was overall asymptomatic during today's visit with these. - Will plan to obtain a 3-day Zio patch as previously recommended and this will allow for further assessment of bradycardia and his PVC burden. If having very frequent PVC's, may need to either consider initiation of Toprol-XL 12.5 mg daily (previously on Lopressor 12.5 mg twice daily) or antiarrhythmic therapy per EP since he did have more notable bradycardia with low-dose AV nodal blocking agents. TSH was recently checked and within normal limits. Will check CBC, BMP and Mg. Will also obtain an echocardiogram to assess for any structural abnormalities given the timeframe since last evaluation.  2. CAD - He is s/p CABG in 2015 with LIMA-LAD, SVG-PDA and seq SVG-RI-OM and NST in 09/2021 showed very mild ischemia and a low-risk study. He denies any recent chest pain which was his prior anginal symptom. Will plan for a follow-up echocardiogram as discussed above. Remains on ASA 81 mg daily and Pravastatin 40 mg daily. He  is no longer on beta-blocker therapy due to bradycardia.  3. HTN - Blood pressure was initially recorded at 142/78, rechecked and improved to 132/78. Continue Amlodipine 5 mg daily.  This can be further titrated in the future if needed.  4. HLD - His LDL was at 66 when checked earlier this month. Remains on Pravastatin 40 mg daily.  Signed, Ellsworth Lennox, PA-C

## 2023-09-29 NOTE — Telephone Encounter (Signed)
I spoke with wife and offered her an apt for Mr.Hutt today at 1 pm with B.Strader,PA-C and they accepted.

## 2023-09-30 LAB — BASIC METABOLIC PANEL
BUN/Creatinine Ratio: 19 (ref 10–24)
BUN: 21 mg/dL (ref 8–27)
CO2: 21 mmol/L (ref 20–29)
Calcium: 9.8 mg/dL (ref 8.6–10.2)
Chloride: 105 mmol/L (ref 96–106)
Creatinine, Ser: 1.1 mg/dL (ref 0.76–1.27)
Glucose: 156 mg/dL — ABNORMAL HIGH (ref 70–99)
Potassium: 4.2 mmol/L (ref 3.5–5.2)
Sodium: 142 mmol/L (ref 134–144)
eGFR: 69 mL/min/{1.73_m2} (ref >59)

## 2023-09-30 LAB — MAGNESIUM: Magnesium: 2.1 mg/dL (ref 1.6–2.3)

## 2023-09-30 LAB — CBC
Hematocrit: 45.4 % (ref 37.5–51.0)
Hemoglobin: 14.8 g/dL (ref 13.0–17.7)
MCH: 29.4 pg (ref 26.6–33.0)
MCHC: 32.6 g/dL (ref 31.5–35.7)
MCV: 90 fL (ref 79–97)
Platelets: 246 10*3/uL (ref 150–450)
RBC: 5.03 x10E6/uL (ref 4.14–5.80)
RDW: 14.1 % (ref 11.6–15.4)
WBC: 11.4 10*3/uL — ABNORMAL HIGH (ref 3.4–10.8)

## 2023-10-04 DIAGNOSIS — M79676 Pain in unspecified toe(s): Secondary | ICD-10-CM | POA: Diagnosis not present

## 2023-10-04 DIAGNOSIS — L84 Corns and callosities: Secondary | ICD-10-CM | POA: Diagnosis not present

## 2023-10-04 DIAGNOSIS — R350 Frequency of micturition: Secondary | ICD-10-CM | POA: Diagnosis not present

## 2023-10-04 DIAGNOSIS — E1142 Type 2 diabetes mellitus with diabetic polyneuropathy: Secondary | ICD-10-CM | POA: Diagnosis not present

## 2023-10-04 DIAGNOSIS — N401 Enlarged prostate with lower urinary tract symptoms: Secondary | ICD-10-CM | POA: Diagnosis not present

## 2023-10-04 DIAGNOSIS — N289 Disorder of kidney and ureter, unspecified: Secondary | ICD-10-CM | POA: Diagnosis not present

## 2023-10-04 DIAGNOSIS — K5730 Diverticulosis of large intestine without perforation or abscess without bleeding: Secondary | ICD-10-CM | POA: Diagnosis not present

## 2023-10-04 DIAGNOSIS — B351 Tinea unguium: Secondary | ICD-10-CM | POA: Diagnosis not present

## 2023-10-04 DIAGNOSIS — N200 Calculus of kidney: Secondary | ICD-10-CM | POA: Diagnosis not present

## 2023-10-04 DIAGNOSIS — N201 Calculus of ureter: Secondary | ICD-10-CM | POA: Diagnosis not present

## 2023-10-04 DIAGNOSIS — N281 Cyst of kidney, acquired: Secondary | ICD-10-CM | POA: Diagnosis not present

## 2023-10-10 DIAGNOSIS — N201 Calculus of ureter: Secondary | ICD-10-CM | POA: Diagnosis not present

## 2023-10-10 DIAGNOSIS — N3000 Acute cystitis without hematuria: Secondary | ICD-10-CM | POA: Diagnosis not present

## 2023-10-10 DIAGNOSIS — N200 Calculus of kidney: Secondary | ICD-10-CM | POA: Diagnosis not present

## 2023-10-12 DIAGNOSIS — R002 Palpitations: Secondary | ICD-10-CM | POA: Diagnosis not present

## 2023-10-12 DIAGNOSIS — R001 Bradycardia, unspecified: Secondary | ICD-10-CM | POA: Diagnosis not present

## 2023-10-14 ENCOUNTER — Ambulatory Visit (HOSPITAL_COMMUNITY)
Admission: RE | Admit: 2023-10-14 | Discharge: 2023-10-14 | Disposition: A | Discharge status: Home / Self Care | Payer: Medicare Other | Source: Ambulatory Visit | Attending: Student | Admitting: Student

## 2023-10-14 DIAGNOSIS — R002 Palpitations: Secondary | ICD-10-CM | POA: Insufficient documentation

## 2023-10-14 DIAGNOSIS — R001 Bradycardia, unspecified: Secondary | ICD-10-CM | POA: Diagnosis not present

## 2023-10-14 DIAGNOSIS — I493 Ventricular premature depolarization: Secondary | ICD-10-CM | POA: Diagnosis not present

## 2023-10-14 LAB — ECHOCARDIOGRAM COMPLETE
AR max vel: 1.82 cm2
AV Area VTI: 1.69 cm2
AV Area mean vel: 1.76 cm2
AV Mean grad: 10 mmHg
AV Peak grad: 18.6 mmHg
Ao pk vel: 2.16 m/s
Area-P 1/2: 3.27 cm2
S' Lateral: 3.1 cm

## 2023-10-14 NOTE — Progress Notes (Signed)
*  PRELIMINARY RESULTS* Echocardiogram 2D Echocardiogram has been performed.  Stacey Drain 10/14/2023, 10:16 AM

## 2023-10-16 ENCOUNTER — Other Ambulatory Visit: Payer: Self-pay

## 2023-10-16 ENCOUNTER — Emergency Department (HOSPITAL_COMMUNITY)

## 2023-10-16 ENCOUNTER — Emergency Department (HOSPITAL_COMMUNITY)
Admission: EM | Admit: 2023-10-16 | Discharge: 2023-10-16 | Disposition: A | Discharge status: Home / Self Care | Attending: Emergency Medicine | Admitting: Emergency Medicine

## 2023-10-16 ENCOUNTER — Encounter (HOSPITAL_COMMUNITY): Payer: Self-pay | Admitting: Pharmacy Technician

## 2023-10-16 DIAGNOSIS — J101 Influenza due to other identified influenza virus with other respiratory manifestations: Secondary | ICD-10-CM | POA: Diagnosis not present

## 2023-10-16 DIAGNOSIS — R0789 Other chest pain: Secondary | ICD-10-CM | POA: Diagnosis not present

## 2023-10-16 DIAGNOSIS — J3489 Other specified disorders of nose and nasal sinuses: Secondary | ICD-10-CM | POA: Diagnosis not present

## 2023-10-16 DIAGNOSIS — J449 Chronic obstructive pulmonary disease, unspecified: Secondary | ICD-10-CM | POA: Diagnosis not present

## 2023-10-16 DIAGNOSIS — R0602 Shortness of breath: Secondary | ICD-10-CM | POA: Diagnosis not present

## 2023-10-16 DIAGNOSIS — R059 Cough, unspecified: Secondary | ICD-10-CM | POA: Diagnosis present

## 2023-10-16 DIAGNOSIS — Z7982 Long term (current) use of aspirin: Secondary | ICD-10-CM | POA: Diagnosis not present

## 2023-10-16 DIAGNOSIS — Z96611 Presence of right artificial shoulder joint: Secondary | ICD-10-CM | POA: Diagnosis not present

## 2023-10-16 DIAGNOSIS — R079 Chest pain, unspecified: Secondary | ICD-10-CM | POA: Diagnosis not present

## 2023-10-16 LAB — CBC WITH DIFFERENTIAL/PLATELET
Abs Immature Granulocytes: 0.02 10*3/uL (ref 0.00–0.07)
Basophils Absolute: 0 10*3/uL (ref 0.0–0.1)
Basophils Relative: 0 %
Eosinophils Absolute: 0.2 10*3/uL (ref 0.0–0.5)
Eosinophils Relative: 2 %
HCT: 43.9 % (ref 39.0–52.0)
Hemoglobin: 14.1 g/dL (ref 13.0–17.0)
Immature Granulocytes: 0 %
Lymphocytes Relative: 4 %
Lymphs Abs: 0.3 10*3/uL — ABNORMAL LOW (ref 0.7–4.0)
MCH: 29.3 pg (ref 26.0–34.0)
MCHC: 32.1 g/dL (ref 30.0–36.0)
MCV: 91.3 fL (ref 80.0–100.0)
Monocytes Absolute: 0.4 10*3/uL (ref 0.1–1.0)
Monocytes Relative: 6 %
Neutro Abs: 6.6 10*3/uL (ref 1.7–7.7)
Neutrophils Relative %: 88 %
Platelets: 202 10*3/uL (ref 150–400)
RBC: 4.81 MIL/uL (ref 4.22–5.81)
RDW: 14.1 % (ref 11.5–15.5)
WBC: 7.5 10*3/uL (ref 4.0–10.5)
nRBC: 0 % (ref 0.0–0.2)

## 2023-10-16 LAB — RESP PANEL BY RT-PCR (RSV, FLU A&B, COVID)  RVPGX2
Influenza A by PCR: POSITIVE — AB
Influenza B by PCR: NEGATIVE
Resp Syncytial Virus by PCR: NEGATIVE
SARS Coronavirus 2 by RT PCR: NEGATIVE

## 2023-10-16 LAB — COMPREHENSIVE METABOLIC PANEL
ALT: 19 U/L (ref 0–44)
AST: 26 U/L (ref 15–41)
Albumin: 3.7 g/dL (ref 3.5–5.0)
Alkaline Phosphatase: 71 U/L (ref 38–126)
Anion gap: 11 (ref 5–15)
BUN: 14 mg/dL (ref 8–23)
CO2: 21 mmol/L — ABNORMAL LOW (ref 22–32)
Calcium: 9.1 mg/dL (ref 8.9–10.3)
Chloride: 103 mmol/L (ref 98–111)
Creatinine, Ser: 0.91 mg/dL (ref 0.61–1.24)
GFR, Estimated: >60 mL/min (ref >60)
Glucose, Bld: 133 mg/dL — ABNORMAL HIGH (ref 70–99)
Potassium: 3.9 mmol/L (ref 3.5–5.1)
Sodium: 135 mmol/L (ref 135–145)
Total Bilirubin: 0.9 mg/dL (ref 0.0–1.2)
Total Protein: 7 g/dL (ref 6.5–8.1)

## 2023-10-16 MED ORDER — OSELTAMIVIR PHOSPHATE 75 MG PO CAPS: 75.0000 mg | ORAL_CAPSULE | Freq: Two times a day (BID) | ORAL | 0 refills | Status: DC

## 2023-10-16 NOTE — Discharge Instructions (Signed)
 Drink plenty of fluids.  Take Tylenol or Motrin for pain.  Follow-up with your doctor this week if not improving

## 2023-10-16 NOTE — ED Triage Notes (Signed)
 Pt here POV with reports of cough, runny nose, body aches since this morning. Endorses chest pain and family reports pt has been shob.

## 2023-10-16 NOTE — ED Provider Notes (Signed)
 [place] EMERGENCY DEPARTMENT AT [facility] Provider Note   CSN: [id number] Arrival date & time: 10/16/23  1105     History  Chief Complaint  Patient presents with   Cough   Chest Pain   Shortness of Breath    Brandon [name] is a 78 y.o. male.  Patient has a history of GERD and anemia.  He presents with cough and some shortness of breath  The history is provided by the patient and medical records. No language interpreter was used.  Cough Cough characteristics:  Non-productive Sputum characteristics:  Nondescript Severity:  Moderate Onset quality:  Sudden Timing:  Constant Progression:  Waxing and waning Chronicity:  New Smoker: no   Context: not animal exposure   Relieved by:  Nothing Worsened by:  Nothing Associated symptoms: chest pain and shortness of breath   Associated symptoms: no eye discharge, no headaches and no rash   Chest Pain Associated symptoms: cough and shortness of breath   Associated symptoms: no abdominal pain, no back pain, no fatigue and no headache   Shortness of Breath Associated symptoms: chest pain and cough   Associated symptoms: no abdominal pain, no headaches and no rash        Home Medications Prior to Admission medications   Medication Sig Start Date End Date Taking? Authorizing Provider  acetaminophen (TYLENOL) 500 MG tablet Take 1,000 mg by mouth 2 (two) times daily as needed for moderate pain (pain score 4-6), fever or headache.   Yes [provider]  allopurinol (ZYLOPRIM) 300 MG tablet Take 150 mg by mouth in the morning.   Yes [provider]  amLODipine (NORVASC) 5 MG tablet Take 1 tablet (5 mg total) by mouth daily. 11/11/19  Yes Burnadette Pop, MD  amoxicillin-clavulanate (AUGMENTIN) 500-125 MG tablet Take 1 tablet by mouth 2 (two) times daily.   Yes [provider]  Ascorbic Acid (VITAMIN C PO) Take 1 tablet by mouth daily.   Yes [provider]  aspirin EC 81 MG tablet Take  81 mg by mouth daily. Swallow whole.   Yes [provider]  Calcium Carb-Cholecalciferol (CALCIUM + VITAMIN D3 PO) Take 1 tablet by mouth daily.   Yes [provider]  dextromethorphan-guaiFENesin (MUCINEX DM) 30-600 MG 12hr tablet Take 1 tablet by mouth 2 (two) times daily as needed for cough.   Yes [provider]  finasteride (PROSCAR) 5 MG tablet Take 5 mg by mouth daily.   Yes [provider]  HYDROcodone-acetaminophen (NORCO/VICODIN) 5-325 MG tablet Take 1 tablet by mouth every 6 (six) hours as needed for moderate pain (pain score 4-6).   Yes [provider]  Misc Natural Products (OSTEO BI-FLEX ADV TRIPLE ST) TABS Take 1 tablet by mouth in the morning.   Yes [provider]  Multiple Vitamins-Minerals (MULTIVITAMIN MEN 50+) TABS Take 1 tablet by mouth daily.   Yes [provider]  Omega-3 Fatty Acids (FISH OIL PO) Take 1 capsule by mouth daily.   Yes [provider]  ondansetron (ZOFRAN) 4 MG tablet Take 4 mg by mouth every 8 (eight) hours as needed for nausea or vomiting.   Yes [provider]  oseltamivir (TAMIFLU) 75 MG capsule Take 1 capsule (75 mg total) by mouth every 12 (twelve) hours. 10/16/23  Yes Bethann Berkshire, MD  pantoprazole (PROTONIX) 40 MG tablet Take 40 mg by mouth daily. 09/10/22  Yes [provider]  pravastatin (PRAVACHOL) 40 MG tablet Take 40 mg by mouth every  evening.    Yes [provider]  pyridostigmine (MESTINON) 60 MG tablet Take 0.5 tablets (30 mg total) by mouth in the morning and at bedtime. 09/19/23 12/12/24 Yes Penumalli, Glenford Bayley, MD  tamsulosin (FLOMAX) 0.4 MG CAPS capsule Take 0.4 mg by mouth 2 (two) times daily.   Yes [provider]  traMADol (ULTRAM) 50 MG tablet Take 1-2 tablets (50-100 mg total) by mouth every 6 (six) hours as needed for moderate pain. 05/03/23  Yes Edmisten, Lyn Hollingshead, PA      Allergies    Lopressor [metoprolol], Neurontin  [gabapentin], and Oxycontin [oxycodone]    Review of Systems   Review of Systems  Constitutional:  Negative for appetite change and fatigue.  HENT:  Negative for congestion, ear discharge and sinus pressure.   Eyes:  Negative for discharge.  Respiratory:  Positive for cough and shortness of breath.   Cardiovascular:  Positive for chest pain.  Gastrointestinal:  Negative for abdominal pain and diarrhea.  Genitourinary:  Negative for frequency and hematuria.  Musculoskeletal:  Negative for back pain.  Skin:  Negative for rash.  Neurological:  Negative for seizures and headaches.  Psychiatric/Behavioral:  Negative for hallucinations.     Physical Exam Updated Vital Signs BP 137/74   Pulse 65   Temp 98.4 F (36.9 C) (Oral)   Resp 19   SpO2 94%  Physical Exam Vitals and nursing note reviewed.  Constitutional:      Appearance: He is well-developed.  HENT:     Head: Normocephalic.     Nose: Nose normal.  Eyes:     General: No scleral icterus.    Conjunctiva/sclera: Conjunctivae normal.  Neck:     Thyroid: No thyromegaly.  Cardiovascular:     Rate and Rhythm: Normal rate and regular rhythm.     Heart sounds: No murmur heard.    No friction rub. No gallop.  Pulmonary:     Breath sounds: No stridor. Wheezing present. No rales.  Chest:     Chest wall: No tenderness.  Abdominal:     General: There is no distension.     Tenderness: There is no abdominal tenderness. There is no rebound.  Musculoskeletal:        General: Normal range of motion.     Cervical back: Neck supple.  Lymphadenopathy:     Cervical: No cervical adenopathy.  Skin:    Findings: No erythema or rash.  Neurological:     Mental Status: He is alert and oriented to person, place, and time.     Motor: No abnormal muscle tone.     Coordination: Coordination normal.  Psychiatric:        Behavior: Behavior normal.     ED Results / Procedures / Treatments   Labs (all labs ordered are listed, but only  abnormal results are displayed) Labs Reviewed  RESP PANEL BY RT-PCR (RSV, FLU A&B, COVID)  RVPGX2 - Abnormal; Notable for the following components:      Result Value   Influenza A by PCR POSITIVE (*)    All other components within normal limits  CBC WITH DIFFERENTIAL/PLATELET - Abnormal; Notable for the following components:   Lymphs Abs 0.3 (*)    All other components within normal limits  COMPREHENSIVE METABOLIC PANEL - Abnormal; Notable for the following components:   CO2 21 (*)    Glucose, Bld 133 (*)    All other components within normal limits    EKG None  Radiology DG Chest 2 View  Result Date: 10/16/2023 CLINICAL DATA:  Shortness of breath.  Rhinorrhea.  Chest pain. EXAM: CHEST - 2 VIEW COMPARISON:  Two-view chest x-ray 5/scratched at two-view chest x-ray 11/06/2019 FINDINGS: Heart size is normal. Atherosclerotic calcifications are present at the aortic arch. Changes of COPD are again noted. No edema or effusion is present. No focal airspace disease is present. CABG is noted. Right shoulder arthroplasty noted. Moderate degenerative changes are present in the left shoulder. The visualized soft tissues and bony thorax are otherwise unremarkable. IMPRESSION: 1. No acute cardiopulmonary disease. 2. COPD. Electronically Signed   By: Marin Roberts M.D.   On: 10/16/2023 12:10    Procedures Procedures    Medications Ordered in ED Medications - No data to display  ED Course/ Medical Decision Making/ A&P                                 Medical Decision Making Amount and/or Complexity of Data Reviewed Labs: ordered. Radiology: ordered.  Risk Prescription drug management.   Patient has influenza.  He will be started on Tamiflu and follow-up with PCP        Final Clinical Impression(s) / ED Diagnoses Final diagnoses:  Influenza A    Rx / DC Orders ED Discharge Orders          Ordered    oseltamivir (TAMIFLU) 75 MG capsule  Every 12 hours        10/16/23  1440              Bethann Berkshire, MD 10/16/23 1752

## 2023-10-26 ENCOUNTER — Telehealth: Payer: Self-pay | Admitting: Cardiology

## 2023-10-26 NOTE — Telephone Encounter (Signed)
 STAT if patient feels like he/she is going to faint   1. Are you feeling dizzy, lightheaded, or faint right now? no    2. Have you passed out?  no (If yes move to .SYNCOPE[facility])   3. Do you have any other symptoms? Weak, nausea   4. Have you checked your HR and BP (record if available)? No  Happened last night and this morning about 9:30. When it happens last like 15 minutes then goes away

## 2023-10-26 NOTE — Telephone Encounter (Signed)
 Also noted by wife, after stopping lopressor and since his echo on 10/14/23 he has felt fine until last night.

## 2023-10-26 NOTE — Telephone Encounter (Signed)
 09/27/23 office note by Dr.McDowell    Assessment and Plan:   1.  Intermittent lightheadedness and nausea, no frank syncope or chest pain.  He was not orthostatic today, but is significantly bradycardic with heart rate in the 40s to 50s.  Plan to stop Lopressor completely.  ECG reviewed.  If he does not feel better within the next few days, plan 72-hour Zio patch.      Result note from B.Strader,PA-C   10/17/23  7:12 AM Per Randall An, PA in regards to your recent monitor report:   Please let the patient know his monitor showed he was predominately in normal sinus rhythm and did have premature atrial contractions and premature ventricular contractions occurring 8.4% and 4.9% of the time respectively. Given that the pumping function of his heart was normal by recent echocardiogram, can remain off medical therapy for now since dizziness did improve with stopping Lopressor. If he develops worsening palpitations, he should make [place] aware as we could consider using short-acting Cardizem but this could cause recurrent bradycardia (low heart rate) as well.    If you have any questions, please let us know.    Thank you      10/26/23  I spoke with patient this am and he reports being awakened at 12 am from his sleep with dizziness and nausea. He stayed in bed and it went away after minutes. He got up this am and had breakfast and noted having diarrhea. Otherwise he feels ok. He said he and his wife both have recently had the flu.He does not check his BP/HR at home.   I will forward to B.Strader,PA-C for review.

## 2023-10-26 NOTE — Telephone Encounter (Signed)
 Pt c/o BP issue: STAT if pt c/o blurred vision, one-sided weakness or slurred speech  1. What are your last 5 BP readings? BP was 135/67 around 11:30am and then 5 mins later 121/64.  2. Are you having any other symptoms (ex. Dizziness, headache, blurred vision, passed out)? No symptoms right now but when the spells happen he feels very lightheaded  3. What is your BP issue? Lacie, pt grand daughter, was returning call as advised to provide bp reading today and HR. The HR was between 54-57 and it was weak.  They'd like a callback at [phone]. Please advise.   STAT if HR is under 50 or over 120  (normal HR is 60-100 beats per minute)  What is your heart rate? 54-57 around 11:30am-11:35am  Do you have a log of your heart rate readings (document readings)? No  Do you have any other symptoms? No symptoms right now but when the spells happen he feels very lightheaded.

## 2023-10-26 NOTE — Telephone Encounter (Signed)
   Did he have any other symptoms besides weakness and nausea? Any chest pain or palpitations? If he has been having diarrhea and recently had the flu, I would be concerned that he could be dehydrated. Try to consume at least 2 L of fluid daily.  Would recommend checking vitals if able to make sure these remain stable.  Signed, Ellsworth Lennox, PA-C 10/26/2023, 10:45 AM Pager: [phone]

## 2023-10-26 NOTE — Telephone Encounter (Signed)
 I failed to mention he had denied CP or palpitations with his dizziness last night. He says during the flu he had been having diarrhea. He will increase his water intake to 2 liters a day.  His granddaughter who is a Theatre stage manager, is coming by this morning to take his VS. I have asked him to call us back with these.

## 2023-10-26 NOTE — Telephone Encounter (Signed)
 VS per granddaughter 135/66, repeated 121/66, hr 15  Wife states patient had another episode of dizziness at 1 pm which lasted 15 minutes.He was sitting in the chair at the time. She also states he had another episode of diarrhea which was "pure water" as she saw it for herself. He denies n/v, abdominal pain, or fever. I advised to to call their pcp.

## 2023-10-26 NOTE — Telephone Encounter (Signed)
 I spoke with wife and she indicated they would call pcp. I recommended he avoid dairy and dried foods for now.

## 2023-10-27 DIAGNOSIS — E663 Overweight: Secondary | ICD-10-CM | POA: Diagnosis not present

## 2023-10-27 DIAGNOSIS — A084 Viral intestinal infection, unspecified: Secondary | ICD-10-CM | POA: Diagnosis not present

## 2023-10-27 DIAGNOSIS — Z6825 Body mass index (BMI) 25.0-25.9, adult: Secondary | ICD-10-CM | POA: Diagnosis not present

## 2023-10-28 DIAGNOSIS — N201 Calculus of ureter: Secondary | ICD-10-CM | POA: Diagnosis not present

## 2023-11-02 ENCOUNTER — Telehealth: Payer: Self-pay | Admitting: Cardiology

## 2023-11-02 ENCOUNTER — Telehealth: Payer: Self-pay | Admitting: *Deleted

## 2023-11-02 NOTE — Telephone Encounter (Signed)
 Pt add on per [facility], NP due to procedure date and med hold. Med rec and consent are done.

## 2023-11-02 NOTE — Telephone Encounter (Signed)
   Pre-operative Risk Assessment    Patient Name: Brandon [name]  DOB: [birth year] MRN: [id number]   Date of last office visit: 09/29/23  Date of next office visit: 12/26/23   Request for Surgical Clearance    Procedure:   Ureteroscopy with holmium laser, ureteral stone   Date of Surgery:  Clearance 11/11/23                                Surgeon:  Dr. Marshia Ly Group or Practice Name:  [facility]  Phone number:  [phone]  Fax number:  [phone]   Type of Clearance Requested:   - Medical  - Pharmacy:  Hold Aspirin     Type of Anesthesia:  General    Additional requests/questions:    Alben Spittle   11/02/2023, 8:26 AM

## 2023-11-02 NOTE — Telephone Encounter (Signed)
   Name: Brandon [name]  DOB: [birth year]  MRN: [id number]  Primary Cardiologist: Nona Dell, MD   Preoperative team, please contact this patient and set up a phone call appointment for further preoperative risk assessment. Please obtain consent and complete medication review. Thank you for your help.  I confirm that guidance regarding antiplatelet and oral anticoagulation therapy has been completed and, if necessary, noted below.  Regarding ASA therapy, we recommend continuation of ASA throughout the perioperative period. However, if the surgeon feels that cessation of ASA is required in the perioperative period, it may be stopped 5-7 days prior to surgery with a plan to resume it as soon as felt to be feasible from a surgical standpoint in the post-operative period.    I also confirmed the patient resides in the state of [place]. As per [facility] Medical Board telemedicine laws, the patient must reside in the state in which the provider is licensed.   Ronney Asters, NP 11/02/2023, 8:41 AM [facility]

## 2023-11-02 NOTE — Telephone Encounter (Signed)
 Pt add on per [facility], NP due to procedure date and med hold. Med rec and consent are done.      Patient Consent for Virtual Visit        Brandon [name] has provided verbal consent on 11/02/2023 for a virtual visit (video or telephone).   CONSENT FOR VIRTUAL VISIT FOR:  Brandon [name]  By participating in this virtual visit I agree to the following:  I hereby voluntarily request, consent and authorize [facility] and its employed or contracted physicians, physician assistants, nurse practitioners or other licensed health care professionals (the Practitioner), to provide me with telemedicine health care services (the "Services") as deemed necessary by the treating Practitioner. I acknowledge and consent to receive the Services by the Practitioner via telemedicine. I understand that the telemedicine visit will involve communicating with the Practitioner through live audiovisual communication technology and the disclosure of certain medical information by electronic transmission. I acknowledge that I have been given the opportunity to request an in-person assessment or other available alternative prior to the telemedicine visit and am voluntarily participating in the telemedicine visit.  I understand that I have the right to withhold or withdraw my consent to the use of telemedicine in the course of my care at any time, without affecting my right to future care or treatment, and that the Practitioner or I may terminate the telemedicine visit at any time. I understand that I have the right to inspect all information obtained and/or recorded in the course of the telemedicine visit and may receive copies of available information for a reasonable fee.  I understand that some of the potential risks of receiving the Services via telemedicine include:  Delay or interruption in medical evaluation due to technological equipment failure or disruption; Information transmitted may not be  sufficient (e.g. poor resolution of images) to allow for appropriate medical decision making by the Practitioner; and/or  In rare instances, security protocols could fail, causing a breach of personal health information.  Furthermore, I acknowledge that it is my responsibility to provide information about my medical history, conditions and care that is complete and accurate to the best of my ability. I acknowledge that Practitioner's advice, recommendations, and/or decision may be based on factors not within their control, such as incomplete or inaccurate data provided by me or distortions of diagnostic images or specimens that may result from electronic transmissions. I understand that the practice of medicine is not an exact science and that Practitioner makes no warranties or guarantees regarding treatment outcomes. I acknowledge that a copy of this consent can be made available to me via my patient portal [facility] My[facility]), or I can request a printed copy by calling the office of [facility].    I understand that my insurance will be billed for this visit.   I have read or had this consent read to me. I understand the contents of this consent, which adequately explains the benefits and risks of the Services being provided via telemedicine.  I have been provided ample opportunity to ask questions regarding this consent and the Services and have had my questions answered to my satisfaction. I give my informed consent for the services to be provided through the use of telemedicine in my medical care

## 2023-11-03 ENCOUNTER — Ambulatory Visit: Attending: Cardiovascular Disease

## 2023-11-03 DIAGNOSIS — Z01810 Encounter for preprocedural cardiovascular examination: Secondary | ICD-10-CM

## 2023-11-03 NOTE — Progress Notes (Signed)
 Virtual Visit via Telephone Note   Because of Brandon [name] co-morbid illnesses, he is at least at moderate risk for complications without adequate follow up.  This format is felt to be most appropriate for this patient at this time.  Due to technical limitations with video connection (technology), today's appointment will be conducted as an audio only telehealth visit, and Brandon [name] verbally agreed to proceed in this manner.   All issues noted in this document were discussed and addressed.  No physical exam could be performed with this format.  Evaluation Performed:  Preoperative cardiovascular risk assessment _____________   Date:  11/03/2023   Patient ID:  Brandon [name], DOB 10-Jun-1946, MRN [id number] Patient Location:  Home Provider location:   Office  Primary Care Provider:  Assunta Found, MD Primary Cardiologist:  Nona Dell, MD  Chief Complaint / Patient Profile   78 y.o. y/o male with a h/o CAD s/p CABG x 4 with LIMA to LAD, SVG to PDA, SVG to ramus intermedius and OM in 2015, hypertension, hyperlipidemia, myasthenia gravis and GERD who is pending Ureteroscopy with holmium laser, ureteral stone with Dr. Annabell Howells on 11/11/23 and presents today for telephonic preoperative cardiovascular risk assessment.  History of Present Illness    Brandon [name] is a 78 y.o. male who presents via audio/video conferencing for a telehealth visit today.  Pt was last seen in cardiology clinic on 09/29/2023 by Randall An, PA.  At that time Brandon [name] was doing well, patient reported some episodes of dizziness that improved with stopping of Lopressor.  He did note some increased palpitations, reporting he did feel his heart skipping beats at times.  He denied any associated symptoms.  Cardiac monitor showed predominantly normal sinus rhythm, he did have PACs and PVCs occurring 8.4 and 4.9% of the time respectively.  No medication changes were recommended.  Echocardiogram on  10/14/2023 indicated LVEF of 65 to 70%, no RWMA, moderate asymmetric LVH, grade 1 diastolic dysfunction.  The patient is now pending procedure as outlined above. Since his last visit, he has remained stable from a cardiac standpoint. Today he denies chest pain, shortness of breath, lower extremity edema, fatigue, palpitations, melena, hematuria, hemoptysis, diaphoresis, weakness, presyncope, syncope, orthopnea, and PND.   Past Medical History    Past Medical History:  Diagnosis Date   Anemia    Arthritis    Coronary atherosclerosis of native coronary artery    Multivessel status post CABG March 2015   DDD (degenerative disc disease), lumbar    Essential hypertension    GERD (gastroesophageal reflux disease)    Hard of hearing both ears    Left ear is worst   Hemorrhoids    History of gout    History of kidney stones    history of myasthenia Gravis 2021   History of [facility] spotted fever    Left ureteral  & renal stone    Lumbago    Mixed hyperlipidemia    Personal history of COVID-19 08/10/2021   Paxlovid   Plantar fascial fibromatosis    Postoperative atrial fibrillation (HCC)    Skin Cancer (HCC)    Use of cane as ambulatory aid    Uses roller walker    UTI (urinary tract infection) 09/16/2021   Wears glasses    Past Surgical History:  Procedure Laterality Date   AMPUTATION Right 03/27/2014   Procedure: DEBRIDEMENT AND CLOSURE RIGHT INDEX FINGER;  Surgeon: Sharma Covert, MD;  Location: [facility] OR;  Service: Orthopedics;  Laterality: Right;   BACK SURGERY  12/2021   BICEPT TENODESIS  11/26/2011   Procedure: BICEPT TENODESIS;  Surgeon: Verlee Rossetti, MD;  Location: [facility] OR;  Service: Orthopedics;  Laterality: Left;   BIOPSY  08/03/2018   Procedure: BIOPSY;  Surgeon: Malissa Hippo, MD;  Location: [facility] SUITE;  Service: Endoscopy;;  antral   Cataract surgery Bilateral    yrs ago per wife on 10-01-2021   CHOLECYSTECTOMY N/A 09/22/2018   Procedure: LAPAROSCOPIC  CHOLECYSTECTOMY;  Surgeon: Lucretia Roers, MD;  Location: [facility] ORS;  Service: General;  Laterality: N/A;   COLONOSCOPY N/A 12/26/2014   Procedure: COLONOSCOPY;  Surgeon: Malissa Hippo, MD;  Location: [facility];  Service: Endoscopy;  Laterality: N/A;  730   CORONARY ARTERY BYPASS GRAFT N/A 10/19/2013   Procedure: CORONARY ARTERY BYPASS GRAFTING (CABG);  Surgeon: Purcell Nails, MD;  Location: [facility] OR;  Service: Open Heart Surgery;  Laterality: N/A;  CABG times four using left internal mammary artery and left leg saphenous vein, incision made on right leg but no vein removed   CYSTOSCOPY W/ URETERAL STENT PLACEMENT Right 02/22/2022   Procedure: CYSTOSCOPY WITH RETROGRADE PYELOGRAM/URETERAL STENT PLACEMENT;  Surgeon: Rene Paci, MD;  Location: [facility] ORS;  Service: Urology;  Laterality: Right;   CYSTOSCOPY WITH RETROGRADE PYELOGRAM, URETEROSCOPY AND STENT PLACEMENT Left 08/24/2016   Procedure: CYSTOSCOPY WITH LEFT RETROGRADE PYELOGRAM, LEFT URETEROSCOPY, BASKET EXTRACTION LEFT URETERAL STONE;  Surgeon: Bjorn Pippin, MD;  Location: [facility] ORS;  Service: Urology;  Laterality: Left;   CYSTOSCOPY WITH RETROGRADE PYELOGRAM, URETEROSCOPY AND STENT PLACEMENT Bilateral 07/12/2023   Procedure: CYSTOSCOPY WITH BILATERAL RETROGRADE RIGHT URETEROSCOPY; REMOVAL OF FOREIGN BODIES COMPLEX WITH FULGURATION;  Surgeon: Bjorn Pippin, MD;  Location: [facility] ORS;  Service: Urology;  Laterality: Bilateral;  1 HR FOR CASE   CYSTOSCOPY/URETEROSCOPY/HOLMIUM LASER/STENT PLACEMENT Left 10/06/2021   Procedure: CYSTOSCOPY LEFT RETROGRADE PYELOGRAM URETEROSCOPY/HOLMIUM LASER/STENT PLACEMENT;  Surgeon: Bjorn Pippin, MD;  Location: [facility];  Service: Urology;  Laterality: Left;   CYSTOSCOPY/URETEROSCOPY/HOLMIUM LASER/STENT PLACEMENT Right 03/01/2022   Procedure: CYSTOSCOPY RIGHT URETEROSCOPY/HOLMIUM LASER/STENT PLACEMENT;  Surgeon: Bjorn Pippin, MD;  Location: [facility]S;  Service: Urology;  Laterality: Right;    ESOPHAGOGASTRODUODENOSCOPY N/A 08/03/2018   Procedure: ESOPHAGOGASTRODUODENOSCOPY (EGD);  Surgeon: Malissa Hippo, MD;  [facility];  Service: Endoscopy;  Laterality: N/A;  2:55   EXTRACORPOREAL SHOCK WAVE LITHOTRIPSY Right 06/18/2019   Procedure: EXTRACORPOREAL SHOCK WAVE LITHOTRIPSY (ESWL);  Surgeon: Crista Elliot, MD;  Location: [facility]S;  Service: Urology;  Laterality: Right;   INTRAOPERATIVE TRANSESOPHAGEAL ECHOCARDIOGRAM N/A 10/19/2013   Procedure: INTRAOPERATIVE TRANSESOPHAGEAL ECHOCARDIOGRAM;  Surgeon: Purcell Nails, MD;  Location: [facility] OR;  Service: Open Heart Surgery;  Laterality: N/A;   KYPHOPLASTY N/A 03/27/2021   Procedure: Lumbar One and Lumbar Two Kyphoplasty;  Surgeon: Maeola Harman, MD;  Location: [facility] OR;  Service: Neurosurgery;  Laterality: N/A;   left hand carpal tunnel release  04/04/2015   LEFT HEART CATHETERIZATION WITH CORONARY ANGIOGRAM N/A 10/09/2013   Procedure: LEFT HEART CATHETERIZATION WITH CORONARY ANGIOGRAM;  Surgeon: Kathleene Hazel, MD;  Location: [facility] CATH LAB;  Service: Cardiovascular;  Laterality: N/A;   Left shoulder rotator cuff repair  12/04/2011   MOHS procedure  01/06/2021   dr Modena Jansky on left hand   ORIF PERIPROSTHETIC FRACTURE Left 03/19/2020   Procedure: OPEN REDUCTION INTERNAL FIXATION (ORIF) PERIPROSTHETIC FRACTURE LEFT FEMUR WITH FEMORAL REVISION;  Surgeon: Ollen Gross, MD;  Location: [facility] ORS;  Service: Orthopedics;  Laterality: Left;   REVERSE SHOULDER ARTHROPLASTY Right 10/08/2022   Procedure: REVERSE SHOULDER ARTHROPLASTY;  Surgeon: Beverely Low, MD;  Location: [facility]S;  Service: Orthopedics;  Laterality: Right;  general, choice with interscalene block   TOTAL HIP ARTHROPLASTY Left 01/28/2020   Procedure: TOTAL HIP ARTHROPLASTY ANTERIOR APPROACH;  Surgeon: Ollen Gross, MD;  Location: [facility] ORS;  Service: Orthopedics;  Laterality: Left;    TOTAL KNEE ARTHROPLASTY Left 04/18/2001   TOTAL KNEE ARTHROPLASTY Right  05/02/2023   Procedure: TOTAL KNEE ARTHROPLASTY;  Surgeon: Ollen Gross, MD;  Location: [facility] ORS;  Service: Orthopedics;  Laterality: Right;   traumaticvamputation of finger  2015   VASECTOMY     yrs ago per wife on 10-01-2021    Allergies  Allergies  Allergen Reactions   Lopressor [Metoprolol] Other (See Comments)    Bradycardia at low doses (12.5mg  BID)   Neurontin [Gabapentin] Other (See Comments)    Hallucinations   Oxycontin [Oxycodone] Other (See Comments)    Confusion     Home Medications    Prior to Admission medications   Medication Sig Start Date End Date Taking? Authorizing Provider  acetaminophen (TYLENOL) 500 MG tablet Take 1,000 mg by mouth 2 (two) times daily as needed for moderate pain (pain score 4-6), fever or headache.    [provider]  allopurinol (ZYLOPRIM) 300 MG tablet Take 150 mg by mouth in the morning.    [provider]  amLODipine (NORVASC) 5 MG tablet Take 1 tablet (5 mg total) by mouth daily. 11/11/19   Burnadette Pop, MD  amoxicillin-clavulanate (AUGMENTIN) 500-125 MG tablet Take 1 tablet by mouth 2 (two) times daily. Patient not taking: Reported on 11/02/2023    [provider]  Ascorbic Acid (VITAMIN C PO) Take 1 tablet by mouth daily.    [provider]  aspirin EC 81 MG tablet Take 81 mg by mouth daily. Swallow whole.    [provider]  Calcium Carb-Cholecalciferol (CALCIUM + VITAMIN D3 PO) Take 1 tablet by mouth daily.    [provider]  dextromethorphan-guaiFENesin (MUCINEX DM) 30-600 MG 12hr tablet Take 1 tablet by mouth 2 (two) times daily as needed for cough. Patient not taking: Reported on 11/02/2023    [provider]  finasteride (PROSCAR) 5 MG tablet Take 5 mg by mouth daily.    [provider]  HYDROcodone-acetaminophen (NORCO/VICODIN) 5-325 MG tablet Take 1 tablet by mouth every 6 (six) hours as needed for moderate pain (pain score 4-6). Patient not taking:  Reported on 11/02/2023    [provider]  Misc Natural Products (OSTEO BI-FLEX ADV TRIPLE ST) TABS Take 1 tablet by mouth in the morning.    [provider]  Multiple Vitamins-Minerals (MULTIVITAMIN MEN 50+) TABS Take 1 tablet by mouth daily.    [provider]  Omega-3 Fatty Acids (FISH OIL PO) Take 1 capsule by mouth daily.    [provider]  ondansetron (ZOFRAN) 4 MG tablet Take 4 mg by mouth every 8 (eight) hours as needed for nausea or vomiting. Patient not taking: Reported on 11/02/2023    [provider]  oseltamivir (TAMIFLU) 75 MG capsule Take 1 capsule (75 mg total) by mouth every 12 (twelve) hours. Patient not taking: Reported on 11/02/2023 10/16/23   Bethann Berkshire, MD  pantoprazole (PROTONIX) 40 MG tablet Take 40 mg by mouth daily. 09/10/22   [provider]  pravastatin (PRAVACHOL) 40 MG tablet Take 40 mg by mouth every evening.  [provider]  pyridostigmine (MESTINON) 60 MG tablet Take 0.5 tablets (30 mg total) by mouth in the morning and at bedtime. 09/19/23 12/12/24  Penumalli, Glenford Bayley, MD  tamsulosin (FLOMAX) 0.4 MG CAPS capsule Take 0.4 mg by mouth 2 (two) times daily.    [provider]  traMADol (ULTRAM) 50 MG tablet Take 1-2 tablets (50-100 mg total) by mouth every 6 (six) hours as needed for moderate pain. 05/03/23   Derenda Fennel, PA    Physical Exam  Vital Signs:  Brandon [name] does not have vital signs available for review today. Given telephonic nature of communication, physical exam is limited. AAOx3. NAD. Normal affect.  Speech and respirations are unlabored. Accessory Clinical Findings   None Assessment & Plan    1.  Preoperative Cardiovascular Risk Assessment:Ureteroscopy with holmium laser, ureteral stone with Dr. Annabell Howells on 11/11/23  Brandon [name]'s perioperative risk of a major cardiac event is 0.9% according to the Revised Cardiac Risk Index (RCRI).  Therefore, he is at low risk for  perioperative complications.   His functional capacity is good at 7.25 METs according to the Duke Activity Status Index (DASI). Recommendations: According to ACC/AHA guidelines, no further cardiovascular testing needed.  The patient may proceed to surgery at acceptable risk.   Antiplatelet and/or Anticoagulation Recommendations: Regarding ASA therapy, we recommend continuation of ASA throughout the perioperative period. However, if the surgeon feels that cessation of ASA is required in the perioperative period, it may be stopped 5-7 days prior to surgery with a plan to resume it as soon as felt to be feasible from a surgical standpoint in the post-operative period.   The patient was advised that if he develops new symptoms prior to surgery to contact our office to arrange for a follow-up visit, and he verbalized understanding.  A copy of this note will be routed to requesting surgeon. Time:   Today, I have spent 10 minutes with the patient with telehealth technology discussing medical history, symptoms, and management plan.    Rip Harbour, NP  11/03/2023, 3:55 PM

## 2023-11-07 ENCOUNTER — Encounter (HOSPITAL_COMMUNITY): Payer: Self-pay | Admitting: Urology

## 2023-11-07 NOTE — Progress Notes (Signed)
 Spoke w/ via phone for pre-op interview--- Hessie Diener and wife [name] per pt request, pt HOH Lab needs dos----    BMP per anesthesia, surgeon orders requested.     Lab results------Current EKG in Epic dated 10/18/23. COVID test -----patient states asymptomatic no test needed Arrive at ------- NPO after MN NO Solid Food.    Med rec completed Medications to take morning of surgery -----Allopurinol, Norvasc, Proscar, Flomax, Pyridiostigmine, and Ultram PRN.  Diabetic medication -----  GLP1 agonist last dose: GLP1 instructions:  Patient instructed no nail polish to be worn day of surgery Patient instructed to bring photo id and insurance card day of surgery Patient aware to have Driver (ride ) / caregiver    for 24 hours after surgery - Wife [name] Montez Morita Patient Special Instructions -----Shower with antibacterial soap. Pre-Op special Instructions -----  Patient verbalized understanding of instructions that were given at this phone interview. Patient denies chest pain, sob, fever, cough at the interview.   Cardiac clearance dated 11/03/23 by K. Chad, NP in [place].

## 2023-11-09 NOTE — H&P (Signed)
 I have bladder cancer.  HPI: Brandon [name] is a 78 year-old male established patient who is here for bladder cancer.    10/03/23: Brandon [name] returns today for cystoscopy. His cytology was negative in November. He has had no hematuria and is voiding well on current therapy.   06/22/23: Brandon [name] returns today for surveillance cystoscopy for his history of bladder cancer as noted below. He completed BCG induction on 04/13/23. He had spasms and was given oxybutynin for the treatments but still could only retain for an hour even with the foley. He had no significant side effects. He has had no hematuria. His IPSS is 7 with nocturia x 2. He would like to try Gemtesa again. UA today has a few bacteria.   02/15/23: Brandon [name] returns today in f/u. 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 He has significant urgency, frequency and nocturia that has increased since the procedure. UA today has primarily blood but some WBC and bacteria. IPSS reviewed.     AUA Symptom Score: He never has the sensation of not emptying his bladder completely after finishing urinating. Less than 50% of the time he has to urinate again fewer than two hours after he has finished urinating. He does not have to stop and start again several times when he urinates. Less than 50% of the time he finds it difficult to postpone urination. Less than 20% of the time he has a weak urinary stream. He never has to push or strain to begin urination. He has to get up to urinate 2 times from the time he goes to bed until the time he gets up in the morning.   Calculated AUA Symptom Score: 7    ALLERGIES: Amoxicillin TABS    MEDICATIONS: Aspirin 81 mg tablet,chewable  Amlodipine Besylate 5 mg tablet  Carvedilol 12.5 mg tablet  Glimepiride 1 mg tablet  Hydralazine Hcl 25 mg tablet  Metformin Er Gastric 500 mg tablet, er gastric retention 24 hr  Rosuvastatin Calcium 40 mg tablet     GU PSH:  Bladder Instill AntiCA Agent - 04/13/2023, 04/06/2023, 03/30/2023, 03/23/2023, 03/16/2023, 03/09/2023 Cystoscopy - 06/22/2023, 01/21/2023 Locm 300-399Mg /Ml Iodine,1Ml - 12/29/2022       PSH Notes: Coronary Artery Bypass Graft (CABG), Prostate Surgerystents, knee surgery   NON-GU PSH: CABG (coronary artery bypass grafting) - 2008 Cholecystectomy (laparoscopic) Visit Complexity (formerly GPC1X) - 02/15/2023, 02/01/2023     GU PMH: History of bladder cancer, His bladder looks inflammed but there are no obvious tumors. I have send a cytology with reflex to FISH and will have him return in 3 months for cystoscopy and possible BCG maintenance. - 06/22/2023 Microscopic hematuria - 06/22/2023 Mixed incontinence, Gemtesa samples given. He will let me know if he needs a script. - 06/22/2023, - 09/03/2022 Bladder Cancer overlapping sites - 04/13/2023, - 04/06/2023, - 03/30/2023, - 03/23/2023, - 03/16/2023, - 03/09/2023, He has T1 HG NMIBC with multifocal CIS. Because there was muscle in the TUR specimen and it was free of tumor, I don't think we need to do a restaging TUR. I discussed options including BCG induction and maintenance, intravesical chemotherapy, cystectomy and possibly Keytruda as options for therapy. I will get him set up for BCG induction. He will need a foley for the duration of each treatment because of his post prostatectomy incontinence. Side effect of BCG reviewed. , - 02/15/2023 CIS of the bladder - 02/15/2023 Gross hematuria - 02/01/2023, He  has bladder wall changes consistent with CIS. I will get him set up for bladder biopsy and fulguration and have him drop off a cytology next week. I have reviewed the risks of bleeding, infection, bladder injury, need for secondary procedures, thrombotic events and anesthetic complications. , - 01/21/2023, - 12/29/2022, - 11/30/2022 History of prostate cancer - 02/01/2023, - 11/30/2022, Prostate Cancer, - 2014 Nocturia, He has nocturia with urgency and UUI but minimal SUI. I  recommended he cut out coffee, tea and sodas. He has a pill rolling tremor in the right hand and may need a neurologic eval at some point. I discussed a PT referral but his wife is about to have cardiac surgery so he is reluctant to get that set up. I have given him samples of Gemtesa to try as well. Side effects and instructions reviewed. - 09/03/2022 Urinary Urgency - 09/03/2022 ED due to arterial insufficiency, Erectile dysfunction due to arterial insufficiency - 2014 Stress Incontinence, Male stress incontinence - 2014      PMH Notes:  1898-08-16 00:00:00 - Note: Normal Routine History And Physical Adult  2007-01-11 13:53:22 - Note: Coronary Artery Disease   NON-GU PMH: Bacteriuria - 01/21/2023 Personal history of other diseases of the circulatory system, History of hypertension - 2014 Personal history of other endocrine, nutritional and metabolic disease, History of hypercholesterolemia - 2014, History of diabetes mellitus, - 2014 Anxiety Arthritis Diabetes Type 2 GERD Heart disease, unspecified Hypercholesterolemia Hypertension Right heart failure, unspecified    FAMILY HISTORY: Blood in the urine - Runs in Family Death In The Family Father - Other Death In The Family Mother - Other Diabetes - Runs in Family Family Health Status Number - Runs In Family heart - Father Prostate Cancer - Father stroke - Mother   SOCIAL HISTORY: Marital Status: Married Preferred Language: English; Ethnicity: Not Hispanic Or Latino; Race: White Current Smoking Status: Patient has never smoked.   Tobacco Use Assessment Completed: Used Tobacco in last 30 days? Does not use smokeless tobacco. Does drink.  Does not use drugs. Drinks 2 caffeinated drinks per day.     Notes: Occupation:, Tobacco Use, Caffeine Use, Alcohol Use, Marital History - Currently Married   REVIEW OF SYSTEMS:    GU Review Male:   Patient reports frequent urination, get up at night to urinate, and leakage of urine. Patient  denies hard to postpone urination, burning/ pain with urination, stream starts and stops, trouble starting your stream, have to strain to urinate , erection problems, and penile pain.  Gastrointestinal (Upper):   Patient denies nausea, vomiting, and indigestion/ heartburn.  Gastrointestinal (Lower):   Patient denies diarrhea and constipation.  Constitutional:   Patient denies fever, night sweats, weight loss, and fatigue.  Skin:   Patient denies skin rash/ lesion and itching.  Eyes:   Patient denies blurred vision and double vision.  Ears/ Nose/ Throat:   Patient denies sore throat and sinus problems.  Hematologic/Lymphatic:   Patient denies swollen glands and easy bruising.  Cardiovascular:   Patient denies leg swelling and chest pains.  Respiratory:   Patient denies cough and shortness of breath.  Endocrine:   Patient denies excessive thirst.  Musculoskeletal:   Patient denies back pain and joint pain.  Neurological:   Patient denies headaches and dizziness.  Psychologic:   Patient denies depression and anxiety.   Notes: Urgency    VITAL SIGNS: None   MULTI-SYSTEM PHYSICAL EXAMINATION:    Constitutional: Well-nourished. No physical deformities. Normally developed. Good grooming.  Respiratory: Normal  breath sounds. No labored breathing, no use of accessory muscles.   Cardiovascular: Regular rate and rhythm. No murmur, no gallop.     Complexity of Data:  Records Review:   Previous Patient Records  Urine Test Review:   Urinalysis, Urine Culture   05/27/10 06/04/09 05/16/08 01/10/07 05/31/06 11/23/05 04/22/05 10/06/04  PSA  Total PSA <0.04  <0.04  <0.04  0.00  0.00  0.00  0.00  0.00     PROCEDURES:         Flexible Cystoscopy - 52000  Risks, benefits, and some of the potential complications of the procedure were discussed. He was prepped with betadine and the urethral was instilled with 2% lidocaine jelly. Cipro 500mg  given for antibiotic prophylaxis.     Meatus:  Normal size.  Normal location. Normal condition.  Urethra:  No strictures.  External Sphincter:  Normal.  Verumontanum:  Normal.  Prostate:  Prostate Surgically Absent.  Bladder Neck:  Non-obstructing.  Ureteral Orifices:  Normal location. Normal size. Normal shape. Effluxed clear urine.  Bladder:  Mild trabeculation. Erythematous mucosa that is patchy but worse at the left trigone and right lateral wall.. No tumors. No stones.      The procedure was well tolerated and there were no complications.         Urinalysis w/Scope Dipstick Dipstick Cont'd Micro  Color: Yellow Bilirubin: Neg mg/dL WBC/hpf: 0 - 5/hpf  Appearance: Clear Ketones: Trace mg/dL RBC/hpf: NS (Not Seen)  Specific Gravity: 1.025 Blood: Neg ery/uL Bacteria: Mod (26-50/hpf)  pH: 5.5 Protein: 2+ mg/dL Cystals: NS (Not Seen)  Glucose: Neg mg/dL Urobilinogen: 0.2 mg/dL Casts: Hyaline    Nitrites: Neg Trichomonas: Not Present    Leukocyte Esterase: Neg leu/uL Mucous: Present      Epithelial Cells: 0 - 5/hpf      Yeast: NS (Not Seen)      Sperm: Not Present    ASSESSMENT:      ICD-10 Details  1 GU:   History of bladder cancer - Z85.51 Chronic, Stable - He has persistent patch erythema 6 months out from BCG therapy. At this time I think we need to repeat a cystoscopy with biopsy and fulguration to r/o persistent/recurrent CIS. I have reviewed the risks in detail including bleeding, infection, bladder and urethral injury, thrombotic events and anesthetic complications. Marland Kitchen    PLAN:            Medications Stop Meds: Oxybutynin Chloride 5 mg tablet 1 tablet PO TID PRN Use as needed for spasm. May cause dry mouth and constipation Start: 03/23/2023  Discontinue: 10/03/2023  - Reason: The medication cycle was completed.            Schedule Return Visit/Planned Activity: Next Available Appointment - Schedule Surgery  Procedure: Unspecified Date - Cystoscopy TURBT 2-5 cm - 16109

## 2023-11-10 NOTE — H&P (Signed)
 3/14/2025[name] returns for follow-up evaluation. At last exam he had passed some gravel with resolution of symptoms on the left side correlating with the previously identified collection of small stones at the left UVJ. He wanted to give himself some more time to see if the previous identified right ureteral calculi would pass. Today, no interval stone material passage. He continues to have intermittent right and left lower back pain but now he feels like he is having a little more discomfort on the right especially over the flank and CVA area. He has had no additional dysuria or gross hematuria. His voiding symptoms are grossly stable without worsening symptomology including absence of any recurrent symptoms suggestive of possible underlying cystitis. KUB today is inconclusive but renal ultrasound shows right hydronephrosis with proximal ureteral dilation.     ALLERGIES: Gabapentin    MEDICATIONS: Finasteride 5 mg tablet 1 tablet PO Daily  Tamsulosin Hcl 0.4 mg capsule 1 capsule PO Daily  Allopurinol 300 mg tablet Oral  Amlodipine Besilate  Aspirin Ec 81 mg tablet, delayed release  Calcium  Fish Oil CAPS Oral  Hydrocodone-Acetaminophen 5 mg-325 mg tablet 1 tablet PO Q 6 H PRN  Hydrocodone-Acetaminophen  Multi-Vitamin TABS Oral  Mupirocin 2 % ointment with prefilled applicator apply a small amount in each nostril twice daily for the next 4 days.  Nitroglycerin 0.4 mg tablet, sublingual  Osteo Bi-Flex Regular Strength TABS Oral  Pantoprazole Sodium  Pravachol 40 MG Oral Tablet Oral  Pyridostigmine Bromide Er  Vitamin C 500 mg tablet Oral     GU PSH: Complex cystometrogram, w/ void pressure and urethral pressure profile studies, any technique - 05/16/2023 Complex Uroflow - 05/16/2023 Cysto Bladder Stone <2.5cm - 2023 Cysto Remove Stent FB Sim - 2023 Cystoscopy - 06/15/2023 Cystoscopy Insert Stent - 02/22/2022 Cystoscopy Ureteroscopy - 2015 Emg surf Electrd - 05/16/2023 ESWL, Right -  2020, 2010 Inject For cystogram - 05/16/2023 Intrabd voidng Press - 05/16/2023 Ureteroscopic laser litho - 03/01/2022, 2023 Ureteroscopic stone removal, Left - 2018       PSH Notes: Cystoscopy With Ureteroscopy, Hand Surgery, CABG (CABG), Knee Replacement, Lithotripsy   NON-GU PSH: Back Surgery (Unspecified) CABG (coronary artery bypass grafting) - 2015 Carpal Tunnel Surgery.. Hip Replacement, Left - 2021 Neuroeltrd Stim Post Tibial - 03/24/2023, 03/17/2023, 03/10/2023, 03/03/2023, 02/24/2023, 02/18/2023, 02/08/2023, 02/01/2023, 01/25/2023, 01/18/2023, 01/11/2023, 01/04/2023 Revise Knee Joint - 2015 Visit Complexity (formerly GPC1X) - 10/10/2023, 10/04/2023, 08/18/2023, 07/21/2023, 06/13/2023, 06/08/2023, 05/25/2023, 02/21/2023, 11/22/2022     GU PMH: Acute Cystitis/UTI - 10/10/2023, (Stable), - 05/25/2023, - 04/15/2022, - 03/15/2022, - 02/22/2022, UA continues to appear infected despite being on appropriate antimicrobial treatment with cephalexin. Repeat urine culture sent today., - 2023, His UA looks infected but he has no fever or significant symptoms of concern. I will culture the urine and treat as needed but he was warned to contact us should he get a fever or more severe pain. , - 2023 Ureteral calculus - 10/10/2023, - 10/04/2023 (Stable), - 03/15/2022, Right, - 2020, Left, - 2017, Calculus of ureter, - 2015 BPH w/LUTS - 10/04/2023, He has BPH with BOO with OAB wet with DI on urodynamics. I discussed options and will see if increasing the tamsulosin to BID will help. He has f/u on 11/13 to reassess. , - 06/08/2023, He has not responded well to PTNS or Gemtesa. I will get him set up for urodynamics to further assess his voiding symtpoms., - 04/06/2023, - 02/21/2023, He is not sure the Leslye Peer helped so he will stop  that but continue the tamsulosin. , - 11/22/2022, - 07/14/2022, He has worsening urgency and nocturia with some UUI and dysuria. I will get a culture since he has some bacteriuria and prior UTI's and I will give him  Gemtesa samples to try. Side effects and instructions reviewed. f/u in 4-6 weeks for a PVR. , - 06/09/2022, He is doing well after the stone removal. I will have him try to cut back the tamsulosin. , - 12/07/2021 (Improving), - 2018 Flank Pain (Stable) - 10/04/2023, - 06/15/2023 (Stable), - 06/13/2023, - 07/14/2022, - 02/22/2022 Bladder Stone - 08/18/2023, - 07/21/2023, - 06/13/2023 Gross hematuria - 08/18/2023, - 07/21/2023, The bleeding is better but not resolved. CT shows no clear cause of the bleeding and cystoscopy is negative. No bladder stones are noted so the stone material appears to be prostatic. He continues to have some flank pain and if the bleeding and pain persist, I may need to consider cystoscopy with retrograde, and possible ureteroscopy. , - 06/15/2023, He has had intermittent hematuria and could have passed a stone since the urine has cleared. There appear to possibly several stones in the bladder but the image quality is compromised by beam hardening. I will have him return for cystoscopy in 2 days. Culture obtained today. , - 06/13/2023, - 02/22/2022, - 2023, - 2023, - 2020 History of urolithiasis - 08/18/2023, - 02/21/2023, Recent MRI showed benign cysts but no obstruction or obvious stones. , - 11/22/2022, Nephrolithiasis, - 2014 Renal calculus - 07/21/2023, - 06/15/2023 (Stable), - 06/13/2023, There a possibly small renal stones without obstruction on RUS. No treatmnet needed. , - 06/08/2023, He has small renal stones but had no recurrent bladder stones on his last CT. He has extensive prostatic stones. , - 04/06/2023, - 08/30/2022, No ureteral stones seen. , - 06/09/2022, - 04/15/2022, He has bilateral non-obstructing renal stones. I will have him return in 6 months with a KUB. , - 12/07/2021, - 2021 (Stable), He has stable small stones. I will have him return in a year with a KUB., - 2019, Bilateral kidney stones, - 2015 Low back pain (Stable), HE has persistent back pain that doesn't appear to be renal  and I have recommended he consider a NSAID if possible and see his PCP if it persists. - 06/08/2023, This is more consistent with musculoskeletal pain. , - 11/22/2022, - 08/30/2022, Left-sided low back pain without sciatica, - 2015 Nocturia (Stable) - 06/08/2023, (Stable), - 04/06/2023, - 02/21/2023, - 11/22/2022, - 06/09/2022, - 12/07/2021, - 2019 Renal cyst, He has bilateral simple cysts that don't need treatment. - 06/08/2023, - 02/21/2023, - 11/22/2022, - 04/15/2022, Bilateral simple cysts. , - 12/07/2021, - 2021, Renal cyst, acquired, - 2014 Urge incontinence - 06/08/2023, - 05/16/2023, - 04/06/2023, - 03/24/2023, - 03/17/2023, - 03/10/2023, - 03/03/2023, - 02/24/2023, - 02/18/2023, - 02/08/2023, - 02/01/2023, - 01/25/2023, - 01/18/2023, - 01/11/2023, - 01/04/2023, - 11/22/2022, - 07/14/2022, - 06/09/2022 Urinary Urgency - 06/08/2023, - 05/16/2023, - 04/06/2023, - 03/24/2023, - 03/17/2023, - 03/10/2023, - 03/03/2023, - 02/24/2023, - 02/21/2023, - 02/18/2023, - 02/08/2023, - 02/01/2023, - 01/25/2023, - 01/18/2023, - 01/11/2023, - 01/04/2023, - 07/14/2022 (Stable), - 06/09/2022, - 2021 Urinary Frequency - 05/16/2023, - 03/24/2023, - 03/17/2023, - 03/10/2023, - 03/03/2023, - 02/24/2023, - 02/18/2023, - 02/08/2023, - 02/01/2023, - 01/25/2023, - 01/18/2023, - 01/11/2023, - 01/04/2023, - 11/22/2022, - 2021 Personal Hx Urinary Tract Infections, I will get a culture today. - 04/06/2023, - 02/21/2023, He has some pyuria and bacteria today with dysuria so I will  get a culture. , - 11/22/2022 Dysuria - 08/30/2022, - 06/09/2022 Renal and ureteral calculus - 02/22/2022, - 2023, - 2023, - 2023, He has left distal ureteral stones with obstruction and that is the likely cause of the hematuria and possibly the left hip pain. He is on tamsulosin and has hydrocodone. I will have him return in 2 weeks for a renal US. If he has not passed the stone, I will consider URS. , - 2023 (Improving), - 2018 Ureteral obstruction secondary to calculous - 2023 Bladder-neck stenosis/contracture, Bladder neck  contracture - 2014      PMH Notes:  2009-01-16 10:23:44 - Note: Arthritis   NON-GU PMH: Bacteriuria, Urine culture today. - 12/07/2021 Encounter for general adult medical examination without abnormal findings, Encounter for preventive health examination - 2015 Personal history of other diseases of the musculoskeletal system and connective tissue, History of gout - 2015 Personal history of other diseases of the circulatory system, History of hypertension - 2014 Personal history of other diseases of the digestive system, History of esophageal reflux - 2014 Personal history of other endocrine, nutritional and metabolic disease, History of hypercholesterolemia - 2014    FAMILY HISTORY: Cardiac Failure - Mother Death In The Family Father - Father Death In The Family Mother - Mother Family Health Status Number - Runs In Family Heart Disease - Runs In Family Hypertension - Runs In Family Lung Cancer - Father nephrolithiasis - Runs In Family   SOCIAL HISTORY: Marital Status: Married Preferred Language: English; Ethnicity: Not Hispanic Or Latino; Race: White Current Smoking Status: Patient does not smoke anymore.  Social Drinker.  Drinks 2 caffeinated drinks per day. 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     Notes: Occupation:, Caffeine Use, Marital History - Currently Married, Previous History Of Smoking, Alcohol Use   REVIEW OF SYSTEMS:    GU Review Male:   Patient denies frequent urination, hard to postpone urination, burning/ pain with urination, get up at night to urinate, leakage of urine, stream starts and stops, trouble starting your stream, have to strain to urinate , erection problems, and penile pain.  Gastrointestinal (Upper):   Patient denies nausea, vomiting, and indigestion/ heartburn.  Gastrointestinal (Lower):   Patient denies diarrhea and constipation.  Constitutional:   Patient denies fever, night sweats, weight loss, and fatigue.  Skin:   Patient denies  skin rash/ lesion and itching.  Eyes:   Patient denies double vision and blurred vision.  Ears/ Nose/ Throat:   Patient denies sore throat and sinus problems.  Hematologic/Lymphatic:   Patient denies swollen glands and easy bruising.  Cardiovascular:   Patient denies leg swelling and chest pains.  Respiratory:   Patient denies cough and shortness of breath.  Endocrine:   Patient denies excessive thirst.  Musculoskeletal:   Patient denies back pain and joint pain.  Neurological:   Patient denies headaches and dizziness.  Psychologic:   Patient denies depression and anxiety.   VITAL SIGNS: None   MULTI-SYSTEM PHYSICAL EXAMINATION:    Constitutional: Well-nourished. No physical deformities. Normally developed. Good grooming.  Neck: Neck symmetrical, not swollen. Normal tracheal position.  Respiratory: No labored breathing, no use of accessory muscles.   Cardiovascular: Normal temperature, normal extremity pulses, no swelling, no varicosities.  Skin: No paleness, no jaundice, no cyanosis. No lesion, no ulcer, no rash.  Neurologic / Psychiatric: Oriented to time, oriented to place, oriented to person. No depression, no anxiety, no agitation.  Gastrointestinal: No mass, no tenderness, no rigidity, non obese abdomen. No CVA  or flank tenderness  Musculoskeletal: Normal gait and station of head and neck.     Complexity of Data:  Source Of History:  Patient, Family/Caregiver, Medical Record Summary  Records Review:   Previous Doctor Records, Previous Hospital Records, Previous Patient Records  Urine Test Review:   Urinalysis, Urine Culture  Urodynamics Review:   Review Bladder Scan  X-Ray Review: KUB: Reviewed Films. Discussed With Patient.  Renal Ultrasound: Reviewed Films. Discussed With Patient.  C.T. Abdomen/Pelvis: Reviewed Films. Reviewed Report.     11/20/04  PSA  Total PSA 0.61     PROCEDURES:         KUB - 74018  A single view of the abdomen is obtained. Compared to prior  exams, I am not able to accurately identify a definitive right ureteral calculi to correlate with today's renal ultrasound findings. Numerous questionable faint opacities overlying the anatomical expected tract of the right ureter continue. He has stable left sided pelvic and other bony opacities as well. Continues with numerous prostate calcifications as well, an obvious bladder stones not seen.      Patient confirmed No Neulasta OnPro Device.            Renal Ultrasound - [id number]  Right Kidney: Length: 12.19 cm Depth: 5.29 cm Cortical Width: 1.82 cm Width: 5.86 cm  Left Kidney: Length: 12.05 cm Depth: 6.05 cm Cortical Width: 1.35 cm Width: 5.04 cm  Left Kidney/Ureter:  1)Several Cystic Areas visualized largest measuring 1.80cm x 1.85cm x 1.73cm Mid Pole-------2)Several Stones visualized largest measuring 1.15cm Upper Pole and 0.92cm Lower Pole-----3)Several Calcifications visualized largest measuring 0.68cm Mid Pole  Right Kidney/Ureter:  1)Hydro with Dilated Renal Pelvis measuring 1.67cm and Dilated Proximal Ureter measuring 1.03cm and Mid Ureter measuring 0.61cm--------2)Multiple Cystic Areas visualized largest measuring a)Upper Pole 2.91cm x 2.68cm x 1.98cm----b)Mid Pole 1.70cm x 1.54cm x 1.79cm----c)Lower Pole 3.86cm x 4.36cm x 3.13cm-----------3)Several Stones visualized largest measuring 0.73cm Upper Pole----4)Several Calcificaitons seen throughout Kidney largest measuring 0.71cm and 0.42cm Mid to Lower Pole  Bladder:  PVR = 7.11ml      Patient confirmed No Neulasta OnPro Device.           Visit Complexity - G2211          Urinalysis w/Scope Dipstick Dipstick Cont'd Micro  Color: Yellow Bilirubin: Neg mg/dL WBC/hpf: 6 - 43/PIR  Appearance: Cloudy Ketones: Neg mg/dL RBC/hpf: 3 - 51/OAC  Specific Gravity: 1.015 Blood: Neg ery/uL Bacteria: Few (10-25/hpf)  pH: 5.5 Protein: Trace mg/dL Cystals: NS (Not Seen)  Glucose: Neg mg/dL Urobilinogen: 0.2 mg/dL Casts: NS (Not Seen)     Nitrites: Neg Trichomonas: Not Present    Leukocyte Esterase: 3+ leu/uL Mucous: Not Present      Epithelial Cells: 0 - 5/hpf      Yeast: NS (Not Seen)      Sperm: Not Present    ASSESSMENT:      ICD-10 Details  1 GU:   Ureteral calculus - N20.1 Right, Acute, Complicated Injury  2   Ureteral obstruction secondary to calculous - N13.2 Right, Undiagnosed New Problem   PLAN:           Orders Labs Urine Culture          Schedule Return Visit/Planned Activity: Next Available Appointment - Schedule Surgery          Document Letter(s):  Created for Patient: Clinical Summary         Notes:   Similar to prior exams today's KUB still remains somewhat inconclusive. Plus  or minus continued presence of a possible mid to right ureteral calculi. An obvious obstructing left ureteral calculi not well-seen. On ultrasound today compared to prior exams there is increased hydroureteronephrosis which is somewhat of a new finding compared to last u/s. Recommend we go ahead and get him set up for ureteroscopy, next available with Dr. Annabell Howells. He can have bilateral retrogrades done at that time to rule out any continued obstructive process in the left ureter as well.   For ureteroscopy I described the risks which include heart attack, stroke, pulmonary embolus, death, bleeding, infection, damage to contiguous structures, positioning injury, ureteral stricture, ureteral avulsion, ureteral injury, need for ureteral stent, inability to perform ureteroscopy, need for an interval procedure, inability to clear stone burden, stent discomfort and pain.   I will send a message to Dr. Annabell Howells to confirm plan of care moving forward. In the meantime he will continue to monitor for any worsening symptoms including uncontrollable pain/discomfort, new or worsening LUTS to suggest possible infection or urinary retention with or without correlating signs/symptoms of systemic infection. He will also monitor for any additional  interval stone material passage.        Next Appointment:      Next Appointment: 11/07/2023 09:15 AM    Appointment Type: KUB    Location: [facility] [phone]    Provider: KUB KUB    Reason for Visit: 4 mnths kub

## 2023-11-11 ENCOUNTER — Ambulatory Visit (HOSPITAL_COMMUNITY): Admitting: Anesthesiology

## 2023-11-11 ENCOUNTER — Ambulatory Visit (HOSPITAL_COMMUNITY)

## 2023-11-11 ENCOUNTER — Ambulatory Visit (HOSPITAL_COMMUNITY)
Admission: RE | Admit: 2023-11-11 | Discharge: 2023-11-11 | Disposition: A | Discharge status: Home / Self Care | Attending: Urology | Admitting: Urology

## 2023-11-11 ENCOUNTER — Encounter (HOSPITAL_COMMUNITY): Payer: Self-pay | Admitting: Urology

## 2023-11-11 ENCOUNTER — Ambulatory Visit (HOSPITAL_BASED_OUTPATIENT_CLINIC_OR_DEPARTMENT_OTHER): Admitting: Anesthesiology

## 2023-11-11 ENCOUNTER — Encounter (HOSPITAL_COMMUNITY)
Admission: RE | Disposition: A | Discharge status: Home / Self Care | Payer: Self-pay | Source: Home / Self Care | Attending: Urology

## 2023-11-11 ENCOUNTER — Other Ambulatory Visit: Payer: Self-pay

## 2023-11-11 DIAGNOSIS — G7000 Myasthenia gravis without (acute) exacerbation: Secondary | ICD-10-CM | POA: Insufficient documentation

## 2023-11-11 DIAGNOSIS — N1330 Unspecified hydronephrosis: Secondary | ICD-10-CM | POA: Diagnosis not present

## 2023-11-11 DIAGNOSIS — K219 Gastro-esophageal reflux disease without esophagitis: Secondary | ICD-10-CM | POA: Insufficient documentation

## 2023-11-11 DIAGNOSIS — N132 Hydronephrosis with renal and ureteral calculous obstruction: Secondary | ICD-10-CM | POA: Diagnosis not present

## 2023-11-11 DIAGNOSIS — I4891 Unspecified atrial fibrillation: Secondary | ICD-10-CM

## 2023-11-11 DIAGNOSIS — I2510 Atherosclerotic heart disease of native coronary artery without angina pectoris: Secondary | ICD-10-CM | POA: Diagnosis not present

## 2023-11-11 DIAGNOSIS — Z951 Presence of aortocoronary bypass graft: Secondary | ICD-10-CM | POA: Insufficient documentation

## 2023-11-11 DIAGNOSIS — I10 Essential (primary) hypertension: Secondary | ICD-10-CM

## 2023-11-11 DIAGNOSIS — Z87891 Personal history of nicotine dependence: Secondary | ICD-10-CM | POA: Insufficient documentation

## 2023-11-11 DIAGNOSIS — M1990 Unspecified osteoarthritis, unspecified site: Secondary | ICD-10-CM | POA: Insufficient documentation

## 2023-11-11 DIAGNOSIS — N202 Calculus of kidney with calculus of ureter: Secondary | ICD-10-CM

## 2023-11-11 DIAGNOSIS — Z87442 Personal history of urinary calculi: Secondary | ICD-10-CM | POA: Insufficient documentation

## 2023-11-11 HISTORY — PX: CYSTOSCOPY/URETEROSCOPY/HOLMIUM LASER/STENT PLACEMENT: SHX6546

## 2023-11-11 LAB — BASIC METABOLIC PANEL WITH GFR
Anion gap: 12 (ref 5–15)
BUN: 20 mg/dL (ref 8–23)
CO2: 22 mmol/L (ref 22–32)
Calcium: 9.1 mg/dL (ref 8.9–10.3)
Chloride: 108 mmol/L (ref 98–111)
Creatinine, Ser: 0.94 mg/dL (ref 0.61–1.24)
GFR, Estimated: >60 mL/min (ref >60)
Glucose, Bld: 95 mg/dL (ref 70–99)
Potassium: 3.6 mmol/L (ref 3.5–5.1)
Sodium: 142 mmol/L (ref 135–145)

## 2023-11-11 SURGERY — CYSTOSCOPY/URETEROSCOPY/HOLMIUM LASER/STENT PLACEMENT
Anesthesia: General | Site: Ureter

## 2023-11-11 MED ORDER — PROPOFOL 10 MG/ML IV BOLUS
INTRAVENOUS | Status: DC | PRN
  Administered 2023-11-11: 200 mg via INTRAVENOUS

## 2023-11-11 MED ORDER — CHLORHEXIDINE GLUCONATE 0.12 % MT SOLN
15.0000 mL | Freq: Once | OROMUCOSAL | Status: AC
  Administered 2023-11-11: 15 mL via OROMUCOSAL

## 2023-11-11 MED ORDER — SODIUM CHLORIDE 0.9 % IV SOLN: 250.0000 mL | INTRAVENOUS | Status: DC | PRN

## 2023-11-11 MED ORDER — CEFAZOLIN SODIUM-DEXTROSE 2-4 GM/100ML-% IV SOLN
2.0000 g | Freq: Three times a day (TID) | INTRAVENOUS | Status: DC
  Administered 2023-11-11: 2 g via INTRAVENOUS
  Filled 2023-11-11: qty 100

## 2023-11-11 MED ORDER — IOHEXOL 300 MG/ML  SOLN
INTRAMUSCULAR | Status: DC | PRN
  Administered 2023-11-11: 20 mL via URETHRAL

## 2023-11-11 MED ORDER — FENTANYL CITRATE (PF) 100 MCG/2ML IJ SOLN: 25.0000 ug | INTRAMUSCULAR | Status: DC | PRN

## 2023-11-11 MED ORDER — SODIUM CHLORIDE 0.9% FLUSH: 3.0000 mL | INTRAVENOUS | Status: DC | PRN

## 2023-11-11 MED ORDER — LACTATED RINGERS IV SOLN: INTRAVENOUS | Status: DC

## 2023-11-11 MED ORDER — LIDOCAINE HCL URETHRAL/MUCOSAL 2 % EX GEL
CUTANEOUS | Status: AC
  Filled 2023-11-11: qty 11

## 2023-11-11 MED ORDER — CHLORHEXIDINE GLUCONATE 0.12 % MT SOLN
OROMUCOSAL | Status: AC
  Filled 2023-11-11: qty 15

## 2023-11-11 MED ORDER — ONDANSETRON HCL 4 MG/2ML IJ SOLN
INTRAMUSCULAR | Status: DC | PRN
  Administered 2023-11-11: 4 mg via INTRAVENOUS

## 2023-11-11 MED ORDER — AMISULPRIDE (ANTIEMETIC) 5 MG/2ML IV SOLN: 10.0000 mg | Freq: Once | INTRAVENOUS | Status: DC | PRN

## 2023-11-11 MED ORDER — LIDOCAINE 2% (20 MG/ML) 5 ML SYRINGE
INTRAMUSCULAR | Status: DC | PRN
  Administered 2023-11-11: 60 mg via INTRAVENOUS

## 2023-11-11 MED ORDER — MORPHINE SULFATE (PF) 2 MG/ML IV SOLN: 2.0000 mg | INTRAVENOUS | Status: DC | PRN

## 2023-11-11 MED ORDER — ONDANSETRON HCL 4 MG/2ML IJ SOLN
INTRAMUSCULAR | Status: AC
  Filled 2023-11-11: qty 2

## 2023-11-11 MED ORDER — ACETAMINOPHEN 325 MG PO TABS: 650.0000 mg | ORAL_TABLET | ORAL | Status: DC | PRN

## 2023-11-11 MED ORDER — DEXAMETHASONE SODIUM PHOSPHATE 10 MG/ML IJ SOLN
INTRAMUSCULAR | Status: AC
  Filled 2023-11-11: qty 1

## 2023-11-11 MED ORDER — DEXAMETHASONE SODIUM PHOSPHATE 10 MG/ML IJ SOLN
INTRAMUSCULAR | Status: DC | PRN
  Administered 2023-11-11: 8 mg via INTRAVENOUS

## 2023-11-11 MED ORDER — LIDOCAINE 2% (20 MG/ML) 5 ML SYRINGE
INTRAMUSCULAR | Status: AC
Start: 2023-11-11 — End: ?
  Filled 2023-11-11: qty 5

## 2023-11-11 MED ORDER — ACETAMINOPHEN 650 MG RE SUPP: 650.0000 mg | RECTAL | Status: DC | PRN

## 2023-11-11 MED ORDER — FENTANYL CITRATE (PF) 250 MCG/5ML IJ SOLN
INTRAMUSCULAR | Status: DC | PRN
Start: 2023-11-11 — End: 2023-11-11
  Administered 2023-11-11: 50 ug via INTRAVENOUS

## 2023-11-11 MED ORDER — CEFAZOLIN SODIUM-DEXTROSE 2-4 GM/100ML-% IV SOLN
INTRAVENOUS | Status: AC
  Filled 2023-11-11: qty 100

## 2023-11-11 MED ORDER — SODIUM CHLORIDE 0.9% FLUSH: 3.0000 mL | Freq: Two times a day (BID) | INTRAVENOUS | Status: DC

## 2023-11-11 MED ORDER — FENTANYL CITRATE (PF) 250 MCG/5ML IJ SOLN
INTRAMUSCULAR | Status: AC
  Filled 2023-11-11: qty 5

## 2023-11-11 MED ORDER — SODIUM CHLORIDE 0.9 % IR SOLN
Status: DC | PRN
  Administered 2023-11-11: 3000 mL

## 2023-11-11 MED ORDER — ACETAMINOPHEN 10 MG/ML IV SOLN: 1000.0000 mg | Freq: Once | INTRAVENOUS | Status: DC | PRN

## 2023-11-11 MED ORDER — PROPOFOL 10 MG/ML IV BOLUS
INTRAVENOUS | Status: AC
  Filled 2023-11-11: qty 20

## 2023-11-11 MED ORDER — ORAL CARE MOUTH RINSE: 15.0000 mL | Freq: Once | OROMUCOSAL | Status: AC

## 2023-11-11 MED ORDER — ONDANSETRON HCL 4 MG/2ML IJ SOLN: 4.0000 mg | Freq: Once | INTRAMUSCULAR | Status: DC | PRN

## 2023-11-11 MED ORDER — MIDAZOLAM HCL 2 MG/2ML IJ SOLN
INTRAMUSCULAR | Status: AC
  Filled 2023-11-11: qty 2

## 2023-11-11 MED ORDER — HYDROCODONE-ACETAMINOPHEN 5-325 MG PO TABS: 1.0000 | ORAL_TABLET | Freq: Four times a day (QID) | ORAL | Status: DC | PRN

## 2023-11-11 SURGICAL SUPPLY — 13 items
BAG DRAIN URO-CYSTO SKYTR STRL (DRAIN) ×1 IMPLANT
CATH URETL OPEN 5X70 (CATHETERS) IMPLANT
CLOTH BEACON ORANGE TIMEOUT ST (SAFETY) ×1 IMPLANT
GLOVE SURG SS PI 8.0 STRL IVOR (GLOVE) ×1 IMPLANT
GOWN STRL REUS W/TWL XL LVL3 (GOWN DISPOSABLE) ×1 IMPLANT
GUIDEWIRE STR DUAL SENSOR (WIRE) ×1 IMPLANT
MANIFOLD NEPTUNE II (INSTRUMENTS) ×1 IMPLANT
NS IRRIG 500ML POUR BTL (IV SOLUTION) ×1 IMPLANT
PACK CYSTO (CUSTOM PROCEDURE TRAY) ×1 IMPLANT
SLEEVE SCD COMPRESS KNEE MED (STOCKING) ×1 IMPLANT
SOL .9 NS 3000ML IRR UROMATIC (IV SOLUTION) ×2 IMPLANT
TUBE CONNECTING 12X1/4 (SUCTIONS) ×1 IMPLANT
TUBING UROLOGY SET (TUBING) IMPLANT

## 2023-11-11 NOTE — Transfer of Care (Signed)
 Immediate Anesthesia Transfer of Care Note  [name]  Procedure(s) Performed: CYSTOSCOPY/BILATERAL RETROGRADE/RIGHT URETEROSCOPY AND STONE MANIPULATION (Ureter)  Patient Location: PACU  Anesthesia Type:General  Level of Consciousness: awake and alert   Airway & Oxygen Therapy: Patient Spontanous Breathing and Patient connected to face mask oxygen  Post-op Assessment: Report given to RN and Post -op Vital signs reviewed and stable  Post vital signs: Reviewed and stable  Last Vitals:  Vitals Value Taken Time  BP 144/81 11/11/23 1048  Temp    Pulse 47 11/11/23 1050  Resp 11 11/11/23 1050  SpO2 100 % 11/11/23 1050  Vitals shown include unfiled device data.  Last Pain:  Vitals:   11/11/23 0757  TempSrc: Oral  PainSc: 0-No pain      Patients Stated Pain Goal: 5 (11/11/23 0757)  Complications: No notable events documented.

## 2023-11-11 NOTE — Op Note (Signed)
 Procedure: 1.  Cystoscopy with bilateral retrograde pyelograms and interpretation. 2.  Cystoscopy with stone manipulation. 3.  Right ureteroscopy. 4.  Application of fluoroscopy.  Preop diagnosis: Right ureteral stone and left renal stones.  Postop diagnosis: Same.  Surgeon: Dr. Bjorn Pippin.  Anesthesia: General.  Specimen: Right ureteral stone.  Drains: None.  EBL: None.  Complications: None.  Indications: The patient is a 78 year old male with a history recurrent urolithiasis who has had right-sided pain and a CT in February that showed a right distal ureteral stone.  He has had persistent symptoms and a recent renal ultrasound showed increased hydronephrosis on the right but a KUB was not clearly indicative of the stone.  It was felt that endoscopic evaluation was indicated.  He also has some small left renal stones it was felt that bilateral retrograde pyelography was appropriate.  Procedure: He was taken operating room where a general anesthetic was induced.  He was placed in the lithotomy position and fitted with PAS hose.  He had been given Ancef.  His perineum and genitalia were prepped with Betadine solution he was draped in usual sterile fashion.  Cystoscopy was performed using a 21 [place] scope and 30 degree lens.  Examination revealed a normal urethra.  There was a moderate bulbar urethral stricture but it did not impede scope passage.  The prostatic urethra was short without significant obstruction.  Examination of bladder revealed moderate trabeculation with some cellules but no tumors, stones or inflammation.  There was a stone present at the right ureteral orifice crowning into the bladder.  The left ureteral orifice was unremarkable.  After a thorough inspection, grasping forceps were used to dislodge the right ureteral stone and remove it from the bladder.  I then performed retrograde pyelography with a 5 [place] open-ended catheter and Omnipaque.  The left retrograde  pyelogram demonstrated a normal ureter and intrarenal collecting system without significant filling defects.  Right retrograde pyelogram demonstrated dilation of the ureter and intrarenal collecting system above the intramural ureter from the recent obstructing stone.  There was suggestion of a filling defect in the lower portion of the dilated ureter so was felt that ureteroscopy was indicated.  A sensor wire was advanced to the kidney through the open-ended catheter and the open-ended catheter was removed.  The 6.5 French semirigid ureteroscope was then inserted alongside the wire and the ureter was inspected.  There was minimal inflammatory change in the intramural ureter from the recent stone but no significant obstruction and no additional stones were identified.  I did not feel that stenting was indicated.  The ureteroscope was removed and the cystoscope was replaced over the wire and then the wire was removed.  The bladder was drained and the scope was removed.  He was taken down from lithotomy position, his anesthetic was reversed and he was moved recovery in stable condition.  There were no complications.

## 2023-11-11 NOTE — Anesthesia Postprocedure Evaluation (Signed)
 Anesthesia Post Note  [name]  Procedure(s) Performed: CYSTOSCOPY/BILATERAL RETROGRADE/RIGHT URETEROSCOPY AND STONE MANIPULATION (Ureter)     Patient location during evaluation: PACU Anesthesia Type: General Level of consciousness: awake Pain management: pain level controlled Vital Signs Assessment: post-procedure vital signs reviewed and stable Respiratory status: spontaneous breathing, nonlabored ventilation and respiratory function stable Cardiovascular status: blood pressure returned to baseline and stable Postop Assessment: no apparent nausea or vomiting Anesthetic complications: no   No notable events documented.  Last Vitals:  Vitals:   11/11/23 1100 11/11/23 1115  BP: 136/80 (!) 153/82  Pulse: (!) 48 (!) 49  Resp: 15 14  Temp:    SpO2: 96% 99%    Last Pain:  Vitals:   11/11/23 1115  TempSrc:   PainSc: 0-No pain                 Brandon [name]

## 2023-11-11 NOTE — Anesthesia Preprocedure Evaluation (Addendum)
 Anesthesia Evaluation  Patient identified by MRN, date of birth, ID band Patient awake    Reviewed: Allergy & Precautions, NPO status , Patient's Chart, lab work & pertinent test results  Airway Mallampati: I  TM Distance: >3 FB Neck ROM: Full    Dental no notable dental hx.    Pulmonary former smoker   Pulmonary exam normal        Cardiovascular hypertension, Pt. on medications + CAD and + CABG  Normal cardiovascular exam+ dysrhythmias Atrial Fibrillation      Neuro/Psych history of myasthenia gravis   Neuromuscular disease  negative psych ROS   GI/Hepatic Neg liver ROS,GERD  Medicated and Controlled,,  Endo/Other  negative endocrine ROS    Renal/GU negative Renal ROS     Musculoskeletal  (+) Arthritis ,    Abdominal   Peds  Hematology negative hematology ROS (+)   Anesthesia Other Findings RIGHT URETERAL STONE  Reproductive/Obstetrics                             Anesthesia Physical Anesthesia Plan  ASA: 3  Anesthesia Plan: General   Post-op Pain Management:    Induction: Intravenous  PONV Risk Score and Plan: 2 and Ondansetron, Dexamethasone and Treatment may vary due to age or medical condition  Airway Management Planned: LMA  Additional Equipment:   Intra-op Plan:   Post-operative Plan: Extubation in OR  Informed Consent: I have reviewed the patients History and Physical, chart, labs and discussed the procedure including the risks, benefits and alternatives for the proposed anesthesia with the patient or authorized representative who has indicated his/her understanding and acceptance.     Dental advisory given  Plan Discussed with: CRNA  Anesthesia Plan Comments:        Anesthesia Quick Evaluation

## 2023-11-11 NOTE — Discharge Instructions (Signed)

## 2023-11-11 NOTE — Interval H&P Note (Signed)
 History and Physical Interval Note:  11/11/2023 9:05 AM  Brandon [name]  has presented today for surgery, with the diagnosis of RIGHT URETERAL STONE.  The various methods of treatment have been discussed with the patient and family. After consideration of risks, benefits and other options for treatment, the patient has consented to  Procedure(s) with comments: CYSTOSCOPY/URETEROSCOPY/HOLMIUM LASER/STENT PLACEMENT (Right) - BILATERAL RETROGRADE PYELOGRAM, POSSIBLE LEFT URETERAL STONE as a surgical intervention.  The patient's history has been reviewed, patient examined, no change in status, stable for surgery.  I have reviewed the patient's chart and labs.  Questions were answered to the patient's satisfaction.     Bjorn Pippin

## 2023-11-11 NOTE — Anesthesia Procedure Notes (Signed)
 Procedure Name: LMA Insertion Date/Time: 11/11/2023 10:08 AM  Performed by: Jacion Dismore C, CRNAPre-anesthesia Checklist: Patient identified, Emergency Drugs available, Suction available and Patient being monitored Patient Re-evaluated:Patient Re-evaluated prior to induction Oxygen Delivery Method: Circle System Utilized Preoxygenation: Pre-oxygenation with 100% oxygen Induction Type: IV induction Ventilation: Mask ventilation without difficulty LMA: LMA inserted LMA Size: 4.0 Number of attempts: 1 Airway Equipment and Method: Bite block Placement Confirmation: positive ETCO2 Tube secured with: Tape Dental Injury: Teeth and Oropharynx as per pre-operative assessment

## 2023-11-12 ENCOUNTER — Encounter (HOSPITAL_COMMUNITY): Payer: Self-pay | Admitting: Urology

## 2023-11-18 DIAGNOSIS — N210 Calculus in bladder: Secondary | ICD-10-CM | POA: Diagnosis not present

## 2023-11-18 DIAGNOSIS — N201 Calculus of ureter: Secondary | ICD-10-CM | POA: Diagnosis not present

## 2023-12-13 DIAGNOSIS — B351 Tinea unguium: Secondary | ICD-10-CM | POA: Diagnosis not present

## 2023-12-13 DIAGNOSIS — E1142 Type 2 diabetes mellitus with diabetic polyneuropathy: Secondary | ICD-10-CM | POA: Diagnosis not present

## 2023-12-13 DIAGNOSIS — L84 Corns and callosities: Secondary | ICD-10-CM | POA: Diagnosis not present

## 2023-12-13 DIAGNOSIS — M79675 Pain in left toe(s): Secondary | ICD-10-CM | POA: Diagnosis not present

## 2023-12-13 DIAGNOSIS — M79674 Pain in right toe(s): Secondary | ICD-10-CM | POA: Diagnosis not present

## 2023-12-26 ENCOUNTER — Ambulatory Visit: Payer: Medicare Other | Attending: Cardiology | Admitting: Cardiology

## 2023-12-26 ENCOUNTER — Encounter: Payer: Self-pay | Admitting: Cardiology

## 2023-12-26 VITALS — BP 132/82 | HR 62 | Ht 73.0 in | Wt 185.6 lb

## 2023-12-26 DIAGNOSIS — I10 Essential (primary) hypertension: Secondary | ICD-10-CM | POA: Diagnosis not present

## 2023-12-26 DIAGNOSIS — E782 Mixed hyperlipidemia: Secondary | ICD-10-CM | POA: Diagnosis not present

## 2023-12-26 DIAGNOSIS — I25119 Atherosclerotic heart disease of native coronary artery with unspecified angina pectoris: Secondary | ICD-10-CM | POA: Diagnosis not present

## 2023-12-26 DIAGNOSIS — I493 Ventricular premature depolarization: Secondary | ICD-10-CM | POA: Diagnosis not present

## 2023-12-26 NOTE — Progress Notes (Signed)
 Cardiology Office Note  Date: 12/26/2023   ID: ABDALLA ADORNETTO, DOB 06/12/1946, MRN [id number]  History of Present Illness: Brandon [name] is a 78 y.o. male last seen in February by Ms. Strader PA-C, I reviewed her note.  He is here today with his wife for a follow-up visit.  He tells me that he has been feeling better overall, no recurring spells, no progressive palpitations or syncope.  Also denies any angina or nitroglycerin  use.  [place]  We went over his medications.  He reports compliance with therapy, no obvious intolerances.  We discussed the results of his echocardiogram and cardiac monitor from earlier in the year.  Do not plan to resume beta-blocker at this time given his current symptomatology.  Physical Exam: VS:  BP 132/82 (BP Location: Left Arm)   Pulse 62   Ht 6\' 1"  (1.854 m)   Wt 185 lb 9.6 oz (84.2 kg)   SpO2 98%   BMI 24.49 kg/m , BMI Body mass index is 24.49 kg/m.  Wt Readings from Last 3 Encounters:  12/26/23 185 lb 9.6 oz (84.2 kg)  11/11/23 181 lb (82.1 kg)  09/29/23 187 lb (84.8 kg)    General: Patient appears comfortable at rest. HEENT: Conjunctiva and lids normal. Neck: Supple, no elevated JVP or carotid bruits. Lungs: Clear to auscultation, nonlabored breathing at rest. Cardiac: Regular rate and rhythm, no S3, 2/6 systolic murmur. Extremities: No pitting edema.  ECG:  An ECG dated 10/16/2023 was personally reviewed today and demonstrated:  Sinus rhythm.  Labwork: February 2025: Cholesterol 154, triglycerides 93, HDL 71, LDL 66, TSH 1.78 09/29/2023: Magnesium  2.1 10/16/2023: ALT 19; AST 26; Hemoglobin 14.1; Platelets 202 11/11/2023: BUN 20; Creatinine, Ser 0.94; Potassium 3.6; Sodium 142   Other Studies Reviewed Today:  Cardiac monitor February 2025: ZIO monitor reviewed.  2 days, 17 hours analyzed.   Predominant rhythm is sinus with heart rate ranging from 44 bpm up to 135 bpm and average heart  rate 68 bpm. There were frequent PACs representing 8.4% total beats with otherwise rare atrial couplets and triplets.  There were 8 brief episodes of PSVT also noted, the longest of which lasted for 9.6 seconds with average heart rate in the 150s. There were occasional PVCs representing 4.9% total beats with otherwise rare ventricular couplets and triplets.  A single episode of NSVT was also noted lasting 5 beats. No pauses or high degree heart block.  Echocardiogram 10/14/2023:  1. Left ventricular ejection fraction, by estimation, is 65 to 70%. The  left ventricle has normal function. The left ventricle has no regional  wall motion abnormalities. There is moderate asymmetric left ventricular  hypertrophy of the basal-septal  segment. Left ventricular diastolic parameters are consistent with Grade I diastolic dysfunction (impaired relaxation).   2. Right ventricular systolic function is normal. The right ventricular  size is normal. Tricuspid regurgitation signal is inadequate for assessing  PA pressure.   3. Left atrial size was severely dilated.   4. The mitral valve is normal in structure. No evidence of mitral valve  regurgitation. No evidence of mitral stenosis.   5. The aortic valve is tricuspid. There is mild calcification of the  aortic valve. There is mild thickening of the aortic valve. Aortic valve  regurgitation is not visualized. Mild aortic valve stenosis. Aortic valve  area, by VTI measures 1.69 cm.  Aortic valve mean gradient measures 10.0 mmHg. Aortic valve Vmax  measures  2.16 m/s.   6. The inferior vena cava is normal in size with greater than 50%  respiratory variability, suggesting right atrial pressure of 3 mmHg.   Assessment and Plan:  1.  Multivessel CAD status post CABG in 2015 with LIMA to LAD, SVG to PDA, and sequential SVG to ramus intermedius and OM.  Lexiscan  Myoview  in February 2023 showed a small inferior/inferoseptal infarct scar with mild peri-infarct  ischemia that has been managed medically.  Follow-up echocardiogram in February revealed LVEF 65 to 70%.  He does not report any angina, plan to continue medical therapy and observation.  Currently on aspirin  81 mg daily and Pravachol  40 mg daily.  2.  Mild calcific aortic stenosis by echocardiogram in February, mean AV gradient 10 mmHg and dimensionless index 0.49.  He is asymptomatic.  Aaron Aas 3.  Mixed hyperlipidemia.  LDL 66 in February.  Continue Pravachol  40 mg daily.   4.  Primary hypertension.  Continue Norvasc  5 mg daily.  5.  Frequent PACs representing approximately 8% total beats and occasional PVCs representing approximately 5% total beats with brief episodes of NSVT by cardiac monitor in February.  LVEF normal at 65 to 70%.  For now we will continue observation, do not plan on resuming beta-blocker at this point.  Disposition:  Follow up 6 months.  Signed, Gerard Knight, M.D., F.A.C.C. [facility] at [facility]

## 2023-12-26 NOTE — Patient Instructions (Addendum)

## 2024-01-05 ENCOUNTER — Telehealth: Payer: Self-pay | Admitting: Cardiology

## 2024-01-05 NOTE — Telephone Encounter (Signed)
 Spoke with patient regarding wife concerns. He stated that he was feeling weak the last couple of days. Happens whether he is walking, sitting down walking or working. Hasn't checked BP jas been out of town traveling currently at [employer] this has started a couple days ago. Feeling nauseous and weak. States that he will be there until about Saturday. Advised him that if symptoms get worse can visit an ER or Urgent care. Routed to provider for further review

## 2024-01-05 NOTE — Telephone Encounter (Signed)
 Pts wife requesting a c/b from the nurse. She states the pt has been having moments where he's getting weak

## 2024-01-06 NOTE — Telephone Encounter (Signed)
 Pt 's wife notified of response. She states that they are coming home today. And if sx persist she will take him to the ER for eval.

## 2024-01-11 DIAGNOSIS — H02834 Dermatochalasis of left upper eyelid: Secondary | ICD-10-CM | POA: Diagnosis not present

## 2024-01-11 DIAGNOSIS — H02831 Dermatochalasis of right upper eyelid: Secondary | ICD-10-CM | POA: Diagnosis not present

## 2024-01-11 DIAGNOSIS — Z961 Presence of intraocular lens: Secondary | ICD-10-CM | POA: Diagnosis not present

## 2024-01-11 DIAGNOSIS — G7000 Myasthenia gravis without (acute) exacerbation: Secondary | ICD-10-CM | POA: Diagnosis not present

## 2024-01-20 DIAGNOSIS — M180 Bilateral primary osteoarthritis of first carpometacarpal joints: Secondary | ICD-10-CM | POA: Diagnosis not present

## 2024-01-20 DIAGNOSIS — M19021 Primary osteoarthritis, right elbow: Secondary | ICD-10-CM | POA: Diagnosis not present

## 2024-01-20 DIAGNOSIS — M79631 Pain in right forearm: Secondary | ICD-10-CM | POA: Diagnosis not present

## 2024-01-31 ENCOUNTER — Ambulatory Visit: Payer: Medicare Other | Attending: Cardiology | Admitting: Cardiology

## 2024-01-31 ENCOUNTER — Encounter: Payer: Self-pay | Admitting: Cardiology

## 2024-01-31 VITALS — BP 110/80 | HR 62 | Ht 73.0 in | Wt 181.8 lb

## 2024-01-31 DIAGNOSIS — I25119 Atherosclerotic heart disease of native coronary artery with unspecified angina pectoris: Secondary | ICD-10-CM | POA: Diagnosis not present

## 2024-01-31 DIAGNOSIS — E782 Mixed hyperlipidemia: Secondary | ICD-10-CM | POA: Diagnosis not present

## 2024-01-31 DIAGNOSIS — I10 Essential (primary) hypertension: Secondary | ICD-10-CM | POA: Insufficient documentation

## 2024-01-31 NOTE — Patient Instructions (Signed)
 Medication Instructions:   Your physician recommends that you continue on your current medications as directed. Please refer to the Current Medication list given to you today.    Labs: None  Testing/Procedures: Your physician has requested that you have a lexiscan  myoview . For further information please visit [email or web address] Please follow instruction sheet, as given.   Follow-Up: To be determined  Any Other Special Instructions Will Be Listed Below (If Applicable).  If you need a refill on your cardiac medications before your next appointment, please call your pharmacy.

## 2024-01-31 NOTE — Progress Notes (Signed)
 Cardiology Office Note  Date: 01/31/2024   ID: Brandon [name], DOB Sep 12, 1945, MRN [id number]  History of Present Illness: Brandon [name] is a 78 y.o. male last seen in May.  He is here with his wife for a follow-up visit.  He went to the [employer] in the interim to go fishing.  While he was there he began experiencing spells of weakness and nausea without obvious provocation.  Initially these occurred fairly frequently and lasted off and on for a few days.  Did not experience any chest pain during this time, no palpitations or syncope.  He states that since then he is having fewer episodes, perhaps once a week and lasting for a few minutes.  He does feel fatigued with exercise at times.  Similar to earlier this year when he wore a cardiac monitor and had follow-up echocardiogram as noted below.  We went over his medications.  He reports compliance with therapy.  Orthostatic vital signs were obtained and negative.  Supine blood pressure 133/87 with heart rate 93, seated blood pressure 142/87 with heart rate 92, standing blood pressure 150/82 with heart rate 92, and standing blood pressure after 12:57 59/90 with heart rate 90.  Physical Exam: VS:  BP 110/80   Pulse 62   Ht 6' 1 (1.854 m)   Wt 181 lb 12.8 oz (82.5 kg)   SpO2 98%   BMI 23.99 kg/m , BMI Body mass index is 23.99 kg/m.  Wt Readings from Last 3 Encounters:  01/31/24 181 lb 12.8 oz (82.5 kg)  12/26/23 185 lb 9.6 oz (84.2 kg)  11/11/23 181 lb (82.1 kg)    General: Patient appears comfortable at rest. HEENT: Conjunctiva and lids normal. Neck: Supple, no elevated JVP or carotid bruits. Lungs: Clear to auscultation, nonlabored breathing at rest. Cardiac: Regular rate and rhythm, no S3, 2/6 systolic murmur. Extremities: No pitting edema.  ECG:  An ECG dated 10/16/2023 was personally reviewed today and demonstrated:  Sinus rhythm.  Labwork: 09/29/2023: Magnesium  2.1 10/16/2023: ALT 19; AST 26; Hemoglobin 14.1; Platelets  202 11/11/2023: BUN 20; Creatinine, Ser 0.94; Potassium 3.6; Sodium 142  February 2025: Cholesterol 154, triglycerides 93, HDL 71, LDL 66  Other Studies Reviewed Today:  No interval cardiac testing for review today.  Assessment and Plan:  1.  Multivessel CAD status post CABG in 2015 with LIMA to LAD, SVG to PDA, and sequential SVG to ramus intermedius and OM.  Lexiscan  Myoview  in February 2023 showed a small inferior/inferoseptal infarct scar with mild peri-infarct ischemia that has been managed medically.  Follow-up echocardiogram in February revealed LVEF 65 to 70%.  He is having intermittent spells as discussed above, also fatigue.  Not orthostatic today.  Continue with present medications including aspirin  81 mg daily and Pravachol  40 mg daily.  We will obtain a follow-up Lexiscan  Myoview  to ensure no progression in ischemic burden.   2.  Mild calcific aortic stenosis by echocardiogram in February, mean AV gradient 10 mmHg and dimensionless index 0.49.  Unlikely to be symptomatic at this point.  . 3.  Mixed hyperlipidemia.  LDL 66 in February.  Continue Pravachol  40 mg daily.   4.  Primary hypertension.  Not found to be orthostatic today.  Continue Norvasc  5 mg daily.   5.  Frequent PACs representing approximately 8% total beats and occasional PVCs representing approximately 5% total beats with brief episodes of NSVT by cardiac monitor in February.  LVEF normal at 65 to 70%.  He is  not on beta-blocker.  Disposition:  Follow up test results.  Signed, Gerard Knight, M.D., F.A.C.C. [facility] at [facility]

## 2024-02-06 ENCOUNTER — Ambulatory Visit (HOSPITAL_COMMUNITY)
Admission: RE | Admit: 2024-02-06 | Discharge: 2024-02-06 | Disposition: A | Discharge status: Home / Self Care | Source: Ambulatory Visit | Attending: Cardiology | Admitting: Cardiology

## 2024-02-06 ENCOUNTER — Encounter (HOSPITAL_BASED_OUTPATIENT_CLINIC_OR_DEPARTMENT_OTHER)
Admission: RE | Admit: 2024-02-06 | Discharge: 2024-02-06 | Disposition: A | Discharge status: Home / Self Care | Source: Ambulatory Visit | Attending: Cardiology | Admitting: Cardiology

## 2024-02-06 ENCOUNTER — Ambulatory Visit: Payer: Self-pay | Admitting: Cardiology

## 2024-02-06 ENCOUNTER — Encounter (HOSPITAL_COMMUNITY): Payer: Self-pay

## 2024-02-06 DIAGNOSIS — I25119 Atherosclerotic heart disease of native coronary artery with unspecified angina pectoris: Secondary | ICD-10-CM | POA: Diagnosis not present

## 2024-02-06 LAB — NM MYOCAR MULTI W/SPECT W/WALL MOTION / EF
Base ST Depression (mm): 0 mm
LV dias vol: 101 mL (ref 62–150)
LV sys vol: 50 mL (ref 4.2–5.8)
MPHR: 142 {beats}/min
Nuc Stress EF: 51 %
Peak HR: 88 {beats}/min
Percent HR: 61 %
RATE: 0.3
Rest HR: 65 {beats}/min
Rest Nuclear Isotope Dose: 10.6 mCi
SDS: 2
SRS: 0
SSS: 2
ST Depression (mm): 0 mm
Stress Nuclear Isotope Dose: 33 mCi
TID: 0.99

## 2024-02-06 MED ORDER — TECHNETIUM TC 99M TETROFOSMIN IV KIT
30.0000 | PACK | Freq: Once | INTRAVENOUS | Status: AC | PRN
  Administered 2024-02-06: 33 via INTRAVENOUS

## 2024-02-06 MED ORDER — SODIUM CHLORIDE FLUSH 0.9 % IV SOLN
INTRAVENOUS | Status: AC
  Administered 2024-02-06: 10 mL via INTRAVENOUS
  Filled 2024-02-06: qty 10

## 2024-02-06 MED ORDER — TECHNETIUM TC 99M TETROFOSMIN IV KIT
10.0000 | PACK | Freq: Once | INTRAVENOUS | Status: AC | PRN
  Administered 2024-02-06: 10.6 via INTRAVENOUS

## 2024-02-06 MED ORDER — REGADENOSON 0.4 MG/5ML IV SOLN
INTRAVENOUS | Status: AC
  Administered 2024-02-06: 0.4 mg via INTRAVENOUS
  Filled 2024-02-06: qty 5

## 2024-02-21 DIAGNOSIS — B351 Tinea unguium: Secondary | ICD-10-CM | POA: Diagnosis not present

## 2024-02-21 DIAGNOSIS — M79675 Pain in left toe(s): Secondary | ICD-10-CM | POA: Diagnosis not present

## 2024-02-21 DIAGNOSIS — M79674 Pain in right toe(s): Secondary | ICD-10-CM | POA: Diagnosis not present

## 2024-02-21 DIAGNOSIS — L84 Corns and callosities: Secondary | ICD-10-CM | POA: Diagnosis not present

## 2024-02-21 DIAGNOSIS — E1142 Type 2 diabetes mellitus with diabetic polyneuropathy: Secondary | ICD-10-CM | POA: Diagnosis not present

## 2024-02-27 DIAGNOSIS — E663 Overweight: Secondary | ICD-10-CM | POA: Diagnosis not present

## 2024-02-27 DIAGNOSIS — M109 Gout, unspecified: Secondary | ICD-10-CM | POA: Diagnosis not present

## 2024-02-27 DIAGNOSIS — Z6826 Body mass index (BMI) 26.0-26.9, adult: Secondary | ICD-10-CM | POA: Diagnosis not present

## 2024-03-05 ENCOUNTER — Telehealth: Payer: Self-pay | Admitting: Cardiology

## 2024-03-05 ENCOUNTER — Other Ambulatory Visit: Payer: Self-pay

## 2024-03-05 MED ORDER — NITROGLYCERIN 0.4 MG SL SUBL: SUBLINGUAL_TABLET | SUBLINGUAL | 1 refills | Status: DC

## 2024-03-05 NOTE — Telephone Encounter (Signed)
 Pt having episodes of weakness, nausea, gait issues, cp that last about then will subside. Pt is concerned he may have another blockage. Requesting cb to further discuss and if he can get sooner appt.

## 2024-03-05 NOTE — Telephone Encounter (Signed)
 Patients wife says since February he has these spells where he sits with his head in his hands and has sweating and nausea and feels pain around his heart. She counted 7 episodes in one day last week. He continues to work outside in the heat saying he is used to it.   He had a recent flair of gout and Dr.Golding stopped his allopurinol  and placed him on indomethacin and colchicine but they have not filled the scripts yet.  Patient tells me he will feel nauseous,weak,sweaty and his heart flutters and he fells out of balance This last a few minutes and resolves. He has never used his NTG as it it is expired. I will send a new script in.  We moved up his appointment to next week with B.Strader,PA-C  Please advise.

## 2024-03-05 NOTE — Telephone Encounter (Signed)
 Wife agrees with plan.

## 2024-03-14 NOTE — Progress Notes (Unsigned)
 Cardiology Office Note    Date:  03/15/2024  ID:  Brandon [name], DOB 1945/12/03, MRN [id number] Cardiologist: Jayson Sierras, MD Cardiology APP:  Johnson Laymon HERO, PA-C { :  History of Present Illness:    Brandon [name] is a 78 y.o. male  with past medical history of CAD (s/p CABG in 2015 with LIMA-LAD, SVG-PDA and seq SVG-RI-OM, NST in 09/2021 showing very mild ischemia and a low-risk study), postoperative atrial fibrillation (occurring in 03/2020 following hip surgery), palpitations (monitor in 09/2023 showing 8.4% PAC burden, 4.9% PVC burden and brief episodes of SVT), HTN, HLD, GERD, myasthenia gravis, BPH and gout who presents to the office today for 1 month follow-up.  He was last examined by Dr. Sierras in 01/2024 and reported having episodes of weakness and nausea while recently on vacation at the [facility]. Denied any specific chest pain or palpitations. Orthostatics were checked and negative at the time of his office visit. A follow-up Lexiscan  Myoview  was recommended to make sure no progression of ischemic burden. He was continued on Amlodipine  5 mg daily, ASA 81 mg daily and Pravastatin  40 mg daily. Was no longer on beta-blocker therapy due to bradycardia earlier in the year. NST was obtained later that month and showed evidence of small basal inferior infarction but no current ischemia and was overall a low-risk study.  His wife called the office earlier this month reporting he was having episodes of nausea and weakness along with chest pain which would last for 15 minutes and then subside. Also reported palpitations at times. This was sent to Dr. Sierras and notes mentioned that he may need further ischemic testing and can review symptoms at his next visit.  In talking with the patient and his wife today, he reports he was having the episodes of weakness, nausea and diaphoresis occurring intermittently since earlier this year but denies any symptoms over the past 3 to 4  days. He is unaware of any significant changes during that timeframe but overall reports feeling much better. Was previously having occasional episodes of chest pressure with this but no recurrence. Does note occasional palpitations which he describes as a fluttering sensation at times but no persistent symptoms. No recent orthopnea, PND or pitting edema.  Studies Reviewed:   EKG: EKG is not ordered today.  Event Monitor: 09/2023 ZIO monitor reviewed.  2 days, 17 hours analyzed.   Predominant rhythm is sinus with heart rate ranging from 44 bpm up to 135 bpm and average heart rate 68 bpm. There were frequent PACs representing 8.4% total beats with otherwise rare atrial couplets and triplets.  There were 8 brief episodes of PSVT also noted, the longest of which lasted for 9.6 seconds with average heart rate in the 150s. There were occasional PVCs representing 4.9% total beats with otherwise rare ventricular couplets and triplets.  A single episode of NSVT was also noted lasting 5 beats. No pauses or high degree heart block.  Echocardiogram: 09/2023 IMPRESSIONS     1. Left ventricular ejection fraction, by estimation, is 65 to 70%. The  left ventricle has normal function. The left ventricle has no regional  wall motion abnormalities. There is moderate asymmetric left ventricular  hypertrophy of the basal-septal  segment. Left ventricular diastolic parameters are consistent with Grade I  diastolic dysfunction (impaired relaxation).   2. Right ventricular systolic function is normal. The right ventricular  size is normal. Tricuspid regurgitation signal is inadequate for assessing  PA pressure.   3. Left  atrial size was severely dilated.   4. The mitral valve is normal in structure. No evidence of mitral valve  regurgitation. No evidence of mitral stenosis.   5. The aortic valve is tricuspid. There is mild calcification of the  aortic valve. There is mild thickening of the aortic valve.  Aortic valve  regurgitation is not visualized. Mild aortic valve stenosis. Aortic valve  area, by VTI measures 1.69 cm.  Aortic valve mean gradient measures 10.0 mmHg. Aortic valve Vmax measures  2.16 m/s.   6. The inferior vena cava is normal in size with greater than 50%  respiratory variability, suggesting right atrial pressure of 3 mmHg.   NST: 01/2024   Findings are consistent with small basal inferior infarction. There is no current ischemia. The study is low risk.   No ST deviation was noted.   LV perfusion is abnormal. Small mild intensity fixed inferobasal defect consistent with small prior infarct.   Left ventricular function is low normal, 51%. End diastolic cavity size is normal.   Physical Exam:   VS:  BP 124/70 (BP Location: Right Arm)   Pulse 78   Ht 6' 1 (1.854 m)   Wt 185 lb (83.9 kg)   SpO2 97%   BMI 24.41 kg/m    Wt Readings from Last 3 Encounters:  03/15/24 185 lb (83.9 kg)  01/31/24 181 lb 12.8 oz (82.5 kg)  12/26/23 185 lb 9.6 oz (84.2 kg)     GEN: Well nourished, well developed male appearing in no acute distress NECK: No JVD; No carotid bruits CARDIAC: RRR, 2/6 SEM along RUSB.  RESPIRATORY:  Clear to auscultation without rales, wheezing or rhonchi  ABDOMEN: Appears non-distended. No obvious abdominal masses. EXTREMITIES: No clubbing or cyanosis. No pitting edema.  Distal pedal pulses are 2+ bilaterally.   Assessment and Plan:   1. Coronary artery disease involving native coronary artery of native heart with angina pectoris (HCC) - He underwent CABG in 2015 with LIMA-LAD, SVG-PDA and seq SVG-RI-OM. Prior NST in 09/2021 showed mild ischemia and recent NST last month showed no evidence of ischemia and was a low-risk study.  - I had previously reviewed with Dr. Debera prior to the patient's visit today and if still having symptoms, he did recommend a cardiac catheterization for definitive evaluation. The patient reports overall feeling better over the  past several days with no recurrent symptoms and does not wish to pursue currently which is reasonable given improvement in symptoms. I encouraged them to notify the office if he has recurrence and we can arrange for a catheterization. Risks and benefits were reviewed today. - For now, continue current medical therapy with ASA 81 mg daily, Amlodipine  5 mg daily and Pravastatin  40 mg daily. Previously intolerant to beta-blocker therapy due to fatigue and bradycardia.   2. Palpitations - Recent monitor in 09/2023 showed predominantly normal sinus rhythm with a 8.4% PAC burden and 4.9% PVC burden with brief episodes of SVT. - He was previously on Toprol -XL but this was discontinued as he reported worsening fatigue with the medication.  3. Primary hypertension - BP is well-controlled at 124/70 during today's visit. Continue current medical therapy Amlodipine  5 mg daily.  4. Hyperlipidemia LDL goal <70 - LDL was at 66 when checked in 09/2023. Continue current medical therapy with Pravastatin  40 mg daily.  5. Aortic Stenosis - This was mild by echocardiogram in 09/2023. Would anticipate repeat imaging in 1 to 2 years for reassessment.    Disposition: He has previously  scheduled follow-up with Dr. Debera on 04/13/2024.  Will keep that for now but we reviewed that if he overall continues to feel well and no recurrent symptoms, can cancel and reschedule for 6 months.  Signed, Laymon CHRISTELLA Qua, PA-C

## 2024-03-15 ENCOUNTER — Ambulatory Visit: Attending: Student | Admitting: Student

## 2024-03-15 ENCOUNTER — Encounter: Payer: Self-pay | Admitting: Student

## 2024-03-15 VITALS — BP 124/70 | HR 78 | Ht 73.0 in | Wt 185.0 lb

## 2024-03-15 DIAGNOSIS — I10 Essential (primary) hypertension: Secondary | ICD-10-CM | POA: Diagnosis not present

## 2024-03-15 DIAGNOSIS — I350 Nonrheumatic aortic (valve) stenosis: Secondary | ICD-10-CM | POA: Diagnosis not present

## 2024-03-15 DIAGNOSIS — E785 Hyperlipidemia, unspecified: Secondary | ICD-10-CM | POA: Insufficient documentation

## 2024-03-15 DIAGNOSIS — I25119 Atherosclerotic heart disease of native coronary artery with unspecified angina pectoris: Secondary | ICD-10-CM | POA: Insufficient documentation

## 2024-03-15 DIAGNOSIS — R002 Palpitations: Secondary | ICD-10-CM | POA: Insufficient documentation

## 2024-03-15 NOTE — Patient Instructions (Signed)
 Medication Instructions:  If you have reoccurrence of symptoms please call and notify the office at [phone].     *If you need a refill on your cardiac medications before your next appointment, please call your pharmacy*  Lab Work: NONE   If you have labs (blood work) drawn today and your tests are completely normal, you will receive your results only by: MyChart Message (if you have MyChart) OR A paper copy in the mail If you have any lab test that is abnormal or we need to change your treatment, we will call you to review the results.  Testing/Procedures: NONE   Follow-Up: At [facility], you and your health needs are our priority.  As part of our continuing mission to provide you with exceptional heart care, our providers are all part of one team.  This team includes your primary Cardiologist (physician) and Advanced Practice Providers or APPs (Physician Assistants and Nurse Practitioners) who all work together to provide you with the care you need, when you need it.  Your next appointment:    August   Provider:   Jayson Sierras, MD    We recommend signing up for the patient portal called MyChart.  Sign up information is provided on this After Visit Summary.  MyChart is used to connect with patients for Virtual Visits (Telemedicine).  Patients are able to view lab/test results, encounter notes, upcoming appointments, etc.  Non-urgent messages can be sent to your provider as well.   To learn more about what you can do with MyChart, go to [email or web address].   Other Instructions Thank you for choosing [facility]!

## 2024-03-26 DIAGNOSIS — Z6826 Body mass index (BMI) 26.0-26.9, adult: Secondary | ICD-10-CM | POA: Diagnosis not present

## 2024-03-26 DIAGNOSIS — Z20828 Contact with and (suspected) exposure to other viral communicable diseases: Secondary | ICD-10-CM | POA: Diagnosis not present

## 2024-03-26 DIAGNOSIS — U071 COVID-19: Secondary | ICD-10-CM | POA: Diagnosis not present

## 2024-03-26 DIAGNOSIS — E663 Overweight: Secondary | ICD-10-CM | POA: Diagnosis not present

## 2024-03-26 DIAGNOSIS — R6889 Other general symptoms and signs: Secondary | ICD-10-CM | POA: Diagnosis not present

## 2024-04-13 ENCOUNTER — Ambulatory Visit: Attending: Cardiology | Admitting: Cardiology

## 2024-04-13 ENCOUNTER — Encounter: Payer: Self-pay | Admitting: Cardiology

## 2024-04-13 ENCOUNTER — Other Ambulatory Visit: Payer: Self-pay | Admitting: Cardiology

## 2024-04-13 VITALS — BP 114/64 | HR 68 | Ht 73.0 in | Wt 187.2 lb

## 2024-04-13 DIAGNOSIS — E782 Mixed hyperlipidemia: Secondary | ICD-10-CM | POA: Insufficient documentation

## 2024-04-13 DIAGNOSIS — I10 Essential (primary) hypertension: Secondary | ICD-10-CM | POA: Diagnosis not present

## 2024-04-13 DIAGNOSIS — I200 Unstable angina: Secondary | ICD-10-CM | POA: Diagnosis not present

## 2024-04-13 DIAGNOSIS — I493 Ventricular premature depolarization: Secondary | ICD-10-CM | POA: Insufficient documentation

## 2024-04-13 DIAGNOSIS — Z01810 Encounter for preprocedural cardiovascular examination: Secondary | ICD-10-CM | POA: Insufficient documentation

## 2024-04-13 DIAGNOSIS — I25119 Atherosclerotic heart disease of native coronary artery with unspecified angina pectoris: Secondary | ICD-10-CM | POA: Diagnosis not present

## 2024-04-13 LAB — BASIC METABOLIC PANEL WITH GFR
BUN: 14 mg/dL (ref 7–25)
CO2: 27 mmol/L (ref 20–32)
Calcium: 8.9 mg/dL (ref 8.6–10.3)
Chloride: 104 mmol/L (ref 98–110)
Creat: 1.05 mg/dL (ref 0.70–1.28)
Glucose, Bld: 97 mg/dL (ref 65–99)
Potassium: 4 mmol/L (ref 3.5–5.3)
Sodium: 139 mmol/L (ref 135–146)
eGFR: 73 mL/min/1.73m2 (ref >=60)

## 2024-04-13 LAB — CBC WITH DIFFERENTIAL/PLATELET
Absolute Lymphocytes: 1537 {cells}/uL (ref 850–3900)
Absolute Monocytes: 529 {cells}/uL (ref 200–950)
Basophils Absolute: 63 {cells}/uL (ref 0–200)
Basophils Relative: 1 %
Eosinophils Absolute: 340 {cells}/uL (ref 15–500)
Eosinophils Relative: 5.4 %
HCT: 40.2 % (ref 38.5–50.0)
Hemoglobin: 13.2 g/dL (ref 13.2–17.1)
MCH: 29.5 pg (ref 27.0–33.0)
MCHC: 32.8 g/dL (ref 32.0–36.0)
MCV: 89.7 fL (ref 80.0–100.0)
MPV: 10.4 fL (ref 7.5–12.5)
Monocytes Relative: 8.4 %
Neutro Abs: 3830 {cells}/uL (ref 1500–7800)
Neutrophils Relative %: 60.8 %
Platelets: 229 Thousand/uL (ref 140–400)
RBC: 4.48 Million/uL (ref 4.20–5.80)
RDW: 13.7 % (ref 11.0–15.0)
Total Lymphocyte: 24.4 %
WBC: 6.3 Thousand/uL (ref 3.8–10.8)

## 2024-04-13 NOTE — H&P (View-Only) (Signed)
 Cardiology Office Note  Date: 04/13/2024   ID: Brandon [name], DOB 1945-10-20, MRN [id number]  History of Present Illness: Brandon [name] is a 78 y.o. male last seen in late July by Ms. Strader PA-C, I reviewed her note.  He is here today with his wife for a follow-up visit.  He continues to have spells including onset of fatigue and nausea, diaphoresis, and sometimes chest discomfort.  Cannot pinpoint a clear precipitant, sometimes with exertion, sometimes at rest.  He does not describe any definite palpitations and has had no syncope.  Noninvasive workup of the symptoms has been largely unrevealing.  Most recently he underwent a Lexiscan  Myoview  showing small inferior basal infarct scar with low normal LVEF and no frank ischemia.  He did wear a cardiac monitor earlier in the year showing frequent PACs and occasional PVCs.  Today we discussed proceeding to a diagnostic cardiac catheterization given recurring symptoms and he is in agreement to proceed.  We discussed the risks and benefits.  We discussed his medications which are unchanged from a cardiac perspective.  His vital signs are stable today.  Physical Exam: VS:  BP 114/64   Pulse 68   Ht 6' 1 (1.854 m)   Wt 187 lb 3.2 oz (84.9 kg)   SpO2 98%   BMI 24.70 kg/m , BMI Body mass index is 24.7 kg/m.  Wt Readings from Last 3 Encounters:  04/13/24 187 lb 3.2 oz (84.9 kg)  03/15/24 185 lb (83.9 kg)  01/31/24 181 lb 12.8 oz (82.5 kg)    General: Patient appears comfortable at rest. HEENT: Conjunctiva and lids normal. Neck: Supple, no elevated JVP or carotid bruits. Lungs: Clear to auscultation, nonlabored breathing at rest. Cardiac: Regular rate and rhythm, no S3, 2/6 systolic murmur. Extremities: No pitting edema.  ECG:  An ECG dated 10/16/2023 was personally reviewed today and demonstrated:  Sinus rhythm.  Labwork: 09/29/2023: Magnesium  2.1 10/16/2023: ALT 19; AST 26; Hemoglobin 14.1; Platelets 202 11/11/2023: BUN 20;  Creatinine, Ser 0.94; Potassium 3.6; Sodium 142     Component Value Date/Time   CHOL 134 06/28/2019 0000   TRIG 157 06/28/2019 0000   HDL 49 06/28/2019 0000   CHOLHDL 2.4 12/06/2013 0757   VLDL 18 12/06/2013 0757   LDLCALC 62 06/28/2019 0000   Other Studies Reviewed Today:  Lexiscan  Myoview  02/06/2024:   Findings are consistent with small basal inferior infarction. There is no current ischemia. The study is low risk.   No ST deviation was noted.   LV perfusion is abnormal. Small mild intensity fixed inferobasal defect consistent with small prior infarct.   Left ventricular function is low normal, 51%. End diastolic cavity size is normal.  Assessment and Plan:  1.  Multivessel CAD status post CABG in 2015 with LIMA to LAD, SVG to PDA, and sequential SVG to ramus intermedius and OM.  Follow-up Lexiscan  Myoview  in June indicated evidence of basal inferior infarct scar but no active ischemia and LVEF 51%.  He continues to have intermittent spells as discussed above concerning for angina.  Vital signs are stable otherwise and his cardiac regimen includes aspirin  81 mg daily, Norvasc  5 mg daily, and Pravachol  40 mg daily.  Given recurring symptoms plan is to proceed with a diagnostic cardiac catheterization to assess for potential revascularization options.  We discussed the risks and benefits and he is in agreement to proceed.   2.  Mild calcific aortic stenosis by echocardiogram in February, mean AV gradient 10 mmHg and  dimensionless index 0.49.  Unlikely to be a component of his symptoms.  . 3.  Mixed hyperlipidemia.  LDL 66 in February.  Continue Pravachol  40 mg daily.   4.  Primary hypertension.  Blood pressure is well-controlled today.   5.  Frequent PACs representing approximately 8% total beats and occasional PVCs representing approximately 5% total beats with brief episodes of NSVT by cardiac monitor in February.  Not on beta-blocker at this time, he has a history of myasthenia  gravis.  Disposition:  Follow up after testing.  Signed, Jayson JUDITHANN Sierras, M.D., F.A.C.C. [facility] at [facility]

## 2024-04-13 NOTE — Patient Instructions (Signed)
 Medication Instructions:  Your physician recommends that you continue on your current medications as directed. Please refer to the Current Medication list given to you today.  Labwork: BMET & CBC today (04/13/2024) at [facility] ([address] [place]. [place]) Non-fasting  Testing/Procedures: Your physician has requested that you have a cardiac catheterization. Cardiac catheterization is used to diagnose and/or treat various heart conditions. Doctors may recommend this procedure for a number of different reasons. The most common reason is to evaluate chest pain. Chest pain can be a symptom of coronary artery disease (CAD), and cardiac catheterization can show whether plaque is narrowing or blocking your heart's arteries. This procedure is also used to evaluate the valves, as well as measure the blood flow and oxygen levels in different parts of your heart. For further information please visit [email or web address] Please follow instruction sheet, as given.  Follow-Up: Your physician recommends that you schedule a follow-up appointment in: 1 month  Any Other Special Instructions Will Be Listed Below (If Applicable).  If you need a refill on your cardiac medications before your next appointment, please call your pharmacy.   [facility] A DEPT OF [facility] [address] [place] [name] [address] Dept: [phone] Loc: [phone]  Brandon [name]  04/13/2024  You are scheduled for a Cardiac Catheterization on Tuesday, August 2 with Dr. Ozell [name].  1. Please arrive at the [facility] (Main Entrance A) at [facility]: [address] [place], [place] [address] at 8:30 AM (This time is 2 hour(s) before your procedure to ensure your preparation).   Free valet parking service is available. You will check in at ADMITTING. The support person will be asked to wait in the waiting room.  It is OK to have someone drop you off  and come back when you are ready to be discharged.    Special note: Every effort is made to have your procedure done on time. Please understand that emergencies sometimes delay scheduled procedures.  2. Diet: Nothing to eat after midnight.   3. Hydration: You need to be well hydrated before your procedure. On September 2, you may drink approved liquids (see below) until 2 hours before the procedure, with 16 oz of water  as your last intake.   List of approved liquids water , clear juice, clear tea, black coffee, fruit juices, non-citric and without pulp, carbonated beverages, Gatorade, Kool -Aid, plain Jello-O and plain ice popsicles.  4. Labs: You will need to have blood drawn on Friday, August 29 at [facility] ([address] [place]). You do not need to be fasting.  5. Medication instructions in preparation for your procedure: You may take your medications on the day of your cath.   Contrast Allergy: No  On the morning of your procedure, take your Aspirin  81 mg and any morning medicines NOT listed above.  You may use sips of water .  6. Plan to go home the same day, you will only stay overnight if medically necessary. 7. Bring a current list of your medications and current insurance cards. 8. You MUST have a responsible person to drive you home. 9. Someone MUST be with you the first 24 hours after you arrive home or your discharge will be delayed. 10. Please wear clothes that are easy to get on and off and wear slip-on shoes.  Thank you for allowing us  to care for you!   -- [facility]

## 2024-04-13 NOTE — Progress Notes (Signed)
 Cardiology Office Note  Date: 04/13/2024   ID: Brandon [name], DOB 1945-10-20, MRN [id number]  History of Present Illness: Brandon [name] is a 78 y.o. male last seen in late July by Ms. Strader PA-C, I reviewed her note.  He is here today with his wife for a follow-up visit.  He continues to have spells including onset of fatigue and nausea, diaphoresis, and sometimes chest discomfort.  Cannot pinpoint a clear precipitant, sometimes with exertion, sometimes at rest.  He does not describe any definite palpitations and has had no syncope.  Noninvasive workup of the symptoms has been largely unrevealing.  Most recently he underwent a Lexiscan  Myoview  showing small inferior basal infarct scar with low normal LVEF and no frank ischemia.  He did wear a cardiac monitor earlier in the year showing frequent PACs and occasional PVCs.  Today we discussed proceeding to a diagnostic cardiac catheterization given recurring symptoms and he is in agreement to proceed.  We discussed the risks and benefits.  We discussed his medications which are unchanged from a cardiac perspective.  His vital signs are stable today.  Physical Exam: VS:  BP 114/64   Pulse 68   Ht 6' 1 (1.854 m)   Wt 187 lb 3.2 oz (84.9 kg)   SpO2 98%   BMI 24.70 kg/m , BMI Body mass index is 24.7 kg/m.  Wt Readings from Last 3 Encounters:  04/13/24 187 lb 3.2 oz (84.9 kg)  03/15/24 185 lb (83.9 kg)  01/31/24 181 lb 12.8 oz (82.5 kg)    General: Patient appears comfortable at rest. HEENT: Conjunctiva and lids normal. Neck: Supple, no elevated JVP or carotid bruits. Lungs: Clear to auscultation, nonlabored breathing at rest. Cardiac: Regular rate and rhythm, no S3, 2/6 systolic murmur. Extremities: No pitting edema.  ECG:  An ECG dated 10/16/2023 was personally reviewed today and demonstrated:  Sinus rhythm.  Labwork: 09/29/2023: Magnesium  2.1 10/16/2023: ALT 19; AST 26; Hemoglobin 14.1; Platelets 202 11/11/2023: BUN 20;  Creatinine, Ser 0.94; Potassium 3.6; Sodium 142     Component Value Date/Time   CHOL 134 06/28/2019 0000   TRIG 157 06/28/2019 0000   HDL 49 06/28/2019 0000   CHOLHDL 2.4 12/06/2013 0757   VLDL 18 12/06/2013 0757   LDLCALC 62 06/28/2019 0000   Other Studies Reviewed Today:  Lexiscan  Myoview  02/06/2024:   Findings are consistent with small basal inferior infarction. There is no current ischemia. The study is low risk.   No ST deviation was noted.   LV perfusion is abnormal. Small mild intensity fixed inferobasal defect consistent with small prior infarct.   Left ventricular function is low normal, 51%. End diastolic cavity size is normal.  Assessment and Plan:  1.  Multivessel CAD status post CABG in 2015 with LIMA to LAD, SVG to PDA, and sequential SVG to ramus intermedius and OM.  Follow-up Lexiscan  Myoview  in June indicated evidence of basal inferior infarct scar but no active ischemia and LVEF 51%.  He continues to have intermittent spells as discussed above concerning for angina.  Vital signs are stable otherwise and his cardiac regimen includes aspirin  81 mg daily, Norvasc  5 mg daily, and Pravachol  40 mg daily.  Given recurring symptoms plan is to proceed with a diagnostic cardiac catheterization to assess for potential revascularization options.  We discussed the risks and benefits and he is in agreement to proceed.   2.  Mild calcific aortic stenosis by echocardiogram in February, mean AV gradient 10 mmHg and  dimensionless index 0.49.  Unlikely to be a component of his symptoms.  . 3.  Mixed hyperlipidemia.  LDL 66 in February.  Continue Pravachol  40 mg daily.   4.  Primary hypertension.  Blood pressure is well-controlled today.   5.  Frequent PACs representing approximately 8% total beats and occasional PVCs representing approximately 5% total beats with brief episodes of NSVT by cardiac monitor in February.  Not on beta-blocker at this time, he has a history of myasthenia  gravis.  Disposition:  Follow up after testing.  Signed, Jayson JUDITHANN Sierras, M.D., F.A.C.C. [facility] at [facility]

## 2024-04-17 ENCOUNTER — Encounter (HOSPITAL_COMMUNITY): Payer: Self-pay | Admitting: Cardiovascular Disease

## 2024-04-17 ENCOUNTER — Other Ambulatory Visit: Payer: Self-pay

## 2024-04-17 ENCOUNTER — Encounter (HOSPITAL_COMMUNITY)
Admission: RE | Disposition: A | Discharge status: Home / Self Care | Payer: Self-pay | Source: Home / Self Care | Attending: Cardiovascular Disease

## 2024-04-17 ENCOUNTER — Ambulatory Visit (HOSPITAL_COMMUNITY)
Admission: RE | Admit: 2024-04-17 | Discharge: 2024-04-17 | Disposition: A | Discharge status: Home / Self Care | Attending: Cardiovascular Disease | Admitting: Cardiovascular Disease

## 2024-04-17 DIAGNOSIS — Z951 Presence of aortocoronary bypass graft: Secondary | ICD-10-CM | POA: Diagnosis not present

## 2024-04-17 DIAGNOSIS — Z01812 Encounter for preprocedural laboratory examination: Secondary | ICD-10-CM

## 2024-04-17 DIAGNOSIS — I350 Nonrheumatic aortic (valve) stenosis: Secondary | ICD-10-CM | POA: Diagnosis not present

## 2024-04-17 DIAGNOSIS — I2582 Chronic total occlusion of coronary artery: Secondary | ICD-10-CM | POA: Diagnosis not present

## 2024-04-17 DIAGNOSIS — G7000 Myasthenia gravis without (acute) exacerbation: Secondary | ICD-10-CM | POA: Diagnosis not present

## 2024-04-17 DIAGNOSIS — I200 Unstable angina: Secondary | ICD-10-CM

## 2024-04-17 DIAGNOSIS — E782 Mixed hyperlipidemia: Secondary | ICD-10-CM | POA: Insufficient documentation

## 2024-04-17 DIAGNOSIS — I25119 Atherosclerotic heart disease of native coronary artery with unspecified angina pectoris: Secondary | ICD-10-CM | POA: Insufficient documentation

## 2024-04-17 DIAGNOSIS — I493 Ventricular premature depolarization: Secondary | ICD-10-CM | POA: Insufficient documentation

## 2024-04-17 DIAGNOSIS — I2510 Atherosclerotic heart disease of native coronary artery without angina pectoris: Secondary | ICD-10-CM

## 2024-04-17 DIAGNOSIS — I10 Essential (primary) hypertension: Secondary | ICD-10-CM | POA: Diagnosis not present

## 2024-04-17 DIAGNOSIS — I472 Ventricular tachycardia, unspecified: Secondary | ICD-10-CM | POA: Insufficient documentation

## 2024-04-17 DIAGNOSIS — Z79899 Other long term (current) drug therapy: Secondary | ICD-10-CM | POA: Diagnosis not present

## 2024-04-17 HISTORY — PX: LEFT HEART CATH AND CORS/GRAFTS ANGIOGRAPHY: CATH118250

## 2024-04-17 SURGERY — LEFT HEART CATH AND CORS/GRAFTS ANGIOGRAPHY
Anesthesia: LOCAL

## 2024-04-17 MED ORDER — MIDAZOLAM HCL 2 MG/2ML IJ SOLN
INTRAMUSCULAR | Status: DC | PRN
  Administered 2024-04-17: 2 mg via INTRAVENOUS

## 2024-04-17 MED ORDER — IOHEXOL 350 MG/ML SOLN
INTRAVENOUS | Status: DC | PRN
  Administered 2024-04-17: 70 mL

## 2024-04-17 MED ORDER — HEPARIN SODIUM (PORCINE) 1000 UNIT/ML IJ SOLN
INTRAMUSCULAR | Status: AC
  Filled 2024-04-17: qty 10

## 2024-04-17 MED ORDER — VERAPAMIL HCL 2.5 MG/ML IV SOLN
INTRAVENOUS | Status: AC
  Filled 2024-04-17: qty 2

## 2024-04-17 MED ORDER — FREE WATER: 500.0000 mL | Freq: Once | Status: DC

## 2024-04-17 MED ORDER — VERAPAMIL HCL 2.5 MG/ML IV SOLN
INTRAVENOUS | Status: DC | PRN
  Administered 2024-04-17: 10 mL via INTRA_ARTERIAL

## 2024-04-17 MED ORDER — MIDAZOLAM HCL 2 MG/2ML IJ SOLN
INTRAMUSCULAR | Status: AC
  Filled 2024-04-17: qty 2

## 2024-04-17 MED ORDER — LIDOCAINE HCL (PF) 1 % IJ SOLN
INTRAMUSCULAR | Status: DC | PRN
Start: 2024-04-17 — End: 2024-04-17
  Administered 2024-04-17: 2 mL via INTRADERMAL

## 2024-04-17 MED ORDER — SODIUM CHLORIDE 0.9% FLUSH
3.0000 mL | INTRAVENOUS | Status: DC | PRN
Start: 2024-04-17 — End: 2024-04-17

## 2024-04-17 MED ORDER — ASPIRIN 81 MG PO CHEW
81.0000 mg | CHEWABLE_TABLET | ORAL | Status: AC
  Administered 2024-04-17: 81 mg via ORAL
  Filled 2024-04-17: qty 1

## 2024-04-17 MED ORDER — SODIUM CHLORIDE 0.9% FLUSH: 3.0000 mL | INTRAVENOUS | Status: DC | PRN

## 2024-04-17 MED ORDER — LIDOCAINE HCL (PF) 1 % IJ SOLN
INTRAMUSCULAR | Status: AC
  Filled 2024-04-17: qty 30

## 2024-04-17 MED ORDER — HYDRALAZINE HCL 20 MG/ML IJ SOLN: 10.0000 mg | INTRAMUSCULAR | Status: DC | PRN

## 2024-04-17 MED ORDER — ONDANSETRON HCL 4 MG/2ML IJ SOLN
4.0000 mg | Freq: Four times a day (QID) | INTRAMUSCULAR | Status: DC | PRN
Start: 2024-04-17 — End: 2024-04-17

## 2024-04-17 MED ORDER — FENTANYL CITRATE (PF) 100 MCG/2ML IJ SOLN
INTRAMUSCULAR | Status: AC
  Filled 2024-04-17: qty 2

## 2024-04-17 MED ORDER — SODIUM CHLORIDE 0.9% FLUSH: 3.0000 mL | Freq: Two times a day (BID) | INTRAVENOUS | Status: DC

## 2024-04-17 MED ORDER — HEPARIN (PORCINE) IN NACL 1000-0.9 UT/500ML-% IV SOLN
INTRAVENOUS | Status: DC | PRN
  Administered 2024-04-17 (×2): 500 mL

## 2024-04-17 MED ORDER — SODIUM CHLORIDE 0.9 % IV SOLN
250.0000 mL | INTRAVENOUS | Status: DC | PRN
Start: 2024-04-17 — End: 2024-04-17

## 2024-04-17 MED ORDER — SODIUM CHLORIDE 0.9 % IV SOLN: 250.0000 mL | INTRAVENOUS | Status: DC | PRN

## 2024-04-17 MED ORDER — FENTANYL CITRATE (PF) 100 MCG/2ML IJ SOLN
INTRAMUSCULAR | Status: DC | PRN
  Administered 2024-04-17: 25 ug via INTRAVENOUS

## 2024-04-17 MED ORDER — HEPARIN SODIUM (PORCINE) 1000 UNIT/ML IJ SOLN
INTRAMUSCULAR | Status: DC | PRN
  Administered 2024-04-17: 4000 [IU] via INTRAVENOUS

## 2024-04-17 MED ORDER — ACETAMINOPHEN 325 MG PO TABS
650.0000 mg | ORAL_TABLET | ORAL | Status: DC | PRN
Start: 2024-04-17 — End: 2024-04-17

## 2024-04-17 SURGICAL SUPPLY — 9 items
CATH EXPO 5F MPA-1 (CATHETERS) IMPLANT
CATH INFINITI 5FR AL1 (CATHETERS) IMPLANT
CATH INFINITI 5FR MULTPACK ANG (CATHETERS) IMPLANT
DEVICE RAD COMP TR BAND LRG (VASCULAR PRODUCTS) IMPLANT
GLIDESHEATH SLEND SS 6F .021 (SHEATH) IMPLANT
GUIDEWIRE INQWIRE 1.5J.035X260 (WIRE) IMPLANT
PACK CARDIAC CATHETERIZATION (CUSTOM PROCEDURE TRAY) ×1 IMPLANT
SET ATX-X65L (MISCELLANEOUS) IMPLANT
SHEATH PROBE COVER 6X72 (BAG) IMPLANT

## 2024-04-17 NOTE — Progress Notes (Signed)
 Patient and wife was given discharge instructions. Both verbalized understanding.

## 2024-04-17 NOTE — Discharge Instructions (Signed)

## 2024-04-17 NOTE — Interval H&P Note (Signed)
 History and Physical Interval Note:  04/17/2024 8:46 AM  Brandon [name]  has presented today for surgery, with the diagnosis of angina.  The various methods of treatment have been discussed with the patient and family. After consideration of risks, benefits and other options for treatment, the patient has consented to  Procedure(s): LEFT HEART CATH AND CORS/GRAFTS ANGIOGRAPHY (N/A) as a surgical intervention.  The patient's history has been reviewed, patient examined, no change in status, stable for surgery.  I have reviewed the patient's chart and labs.  Questions were answered to the patient's satisfaction.     [name]

## 2024-05-01 DIAGNOSIS — L84 Corns and callosities: Secondary | ICD-10-CM | POA: Diagnosis not present

## 2024-05-01 DIAGNOSIS — M79674 Pain in right toe(s): Secondary | ICD-10-CM | POA: Diagnosis not present

## 2024-05-01 DIAGNOSIS — B351 Tinea unguium: Secondary | ICD-10-CM | POA: Diagnosis not present

## 2024-05-01 DIAGNOSIS — M79675 Pain in left toe(s): Secondary | ICD-10-CM | POA: Diagnosis not present

## 2024-05-01 DIAGNOSIS — E1142 Type 2 diabetes mellitus with diabetic polyneuropathy: Secondary | ICD-10-CM | POA: Diagnosis not present

## 2024-05-03 DIAGNOSIS — N200 Calculus of kidney: Secondary | ICD-10-CM | POA: Diagnosis not present

## 2024-05-03 DIAGNOSIS — R1084 Generalized abdominal pain: Secondary | ICD-10-CM | POA: Diagnosis not present

## 2024-05-03 DIAGNOSIS — N201 Calculus of ureter: Secondary | ICD-10-CM | POA: Diagnosis not present

## 2024-05-03 DIAGNOSIS — R300 Dysuria: Secondary | ICD-10-CM | POA: Diagnosis not present

## 2024-05-03 DIAGNOSIS — N3000 Acute cystitis without hematuria: Secondary | ICD-10-CM | POA: Diagnosis not present

## 2024-05-03 DIAGNOSIS — R3915 Urgency of urination: Secondary | ICD-10-CM | POA: Diagnosis not present

## 2024-05-16 ENCOUNTER — Encounter: Payer: Self-pay | Admitting: Cardiology

## 2024-05-16 ENCOUNTER — Ambulatory Visit: Attending: Cardiology | Admitting: Cardiology

## 2024-05-16 VITALS — BP 129/71 | HR 53 | Ht 73.0 in | Wt 181.0 lb

## 2024-05-16 DIAGNOSIS — I25119 Atherosclerotic heart disease of native coronary artery with unspecified angina pectoris: Secondary | ICD-10-CM | POA: Insufficient documentation

## 2024-05-16 DIAGNOSIS — E782 Mixed hyperlipidemia: Secondary | ICD-10-CM | POA: Diagnosis not present

## 2024-05-16 DIAGNOSIS — R42 Dizziness and giddiness: Secondary | ICD-10-CM | POA: Diagnosis not present

## 2024-05-16 DIAGNOSIS — I10 Essential (primary) hypertension: Secondary | ICD-10-CM | POA: Insufficient documentation

## 2024-05-16 NOTE — Progress Notes (Signed)
 Cardiology Office Note  Date: 05/16/2024   ID: Brandon [name], DOB 1945-09-30, MRN [id number]  History of Present Illness: Brandon [name] is a 78 y.o. male last seen in August 2025.  He is here today with his wife for a follow-up visit.  We discussed the results of his cardiac catheterization from September, very reassuring showing patent bypass grafts.  On September 24 he reportedly had 11 episodes of nausea with diaphoresis and weak feeling in his legs, also vague sense of racing heart.  We have been suspicious that his recurring spells represent arrhythmia, however his cardiac monitor from February did not show any distinct correlation.  He has had no frank syncope.  His medications are otherwise unchanged.  Physical Exam: VS:  BP 129/71   Pulse (!) 53   Ht 6' 1 (1.854 m)   Wt 181 lb (82.1 kg)   SpO2 94%   BMI 23.88 kg/m , BMI Body mass index is 23.88 kg/m.  Wt Readings from Last 3 Encounters:  05/16/24 181 lb (82.1 kg)  04/17/24 181 lb (82.1 kg)  04/13/24 187 lb 3.2 oz (84.9 kg)    General: Patient appears comfortable at rest. HEENT: Conjunctiva and lids normal. Lungs: Clear to auscultation, nonlabored breathing at rest. Cardiac: Regular rate and rhythm, no S3, 2/6 systolic murmur.  ECG:  An ECG dated 10/16/2023 was personally reviewed today and demonstrated:  Sinus rhythm.  Labwork: 09/29/2023: Magnesium  2.1 10/16/2023: ALT 19; AST 26 04/13/2024: BUN 14; Creat 1.05; Hemoglobin 13.2; Platelets 229; Potassium 4.0; Sodium 139     Component Value Date/Time   CHOL 134 06/28/2019 0000   TRIG 157 06/28/2019 0000   HDL 49 06/28/2019 0000   CHOLHDL 2.4 12/06/2013 0757   VLDL 18 12/06/2013 0757   LDLCALC 62 06/28/2019 0000   Other Studies Reviewed Today:  Cardiac catheterization 04/17/2024:   Prox RCA lesion is 95% stenosed.   Dist RCA lesion is 90% stenosed.   1st Mrg lesion is 95% stenosed.   Ost Cx to Mid Cx lesion is 50% stenosed.   Mid LAD lesion is 100%  stenosed.   Severe multivessel CAD with total occlusion of the LAD, severe stenoses of the RCA, and severe diffuse stenosis of the LCx S/P CABG with patency of the LIMA-LAD, SVG-PDA, and sequential PDA to the OM1 and OM2 branches of the circumflex Normal LVEDP   Recommend: continue medical therapy.   Assessment and Plan:  1.  Recurring spells that include a sudden feeling of nausea, diaphoresis, weakness in the legs, sometimes palpitations.  This is not positional or necessarily triggered by activity.  Very difficult to pinpoint etiology based on workup so far.  Bypass grafts patent by cardiac catheterization, LVEF 65 to 70% by echocardiogram.  He did wear a 3-day ZIO monitor back in February which showed frequent PACs and occasional PVCs with a few episodes of NSVT, although nothing that would necessarily correlate with his symptoms.  Plan to obtain a 14-day ZIO at this time given continued symptoms.  2.  Multivessel CAD status post CABG in 2015 with LIMA to LAD, SVG to PDA, and sequential SVG to ramus intermedius and OM.  Recent follow-up cardiac catheterization showed patent bypass grafts and normal LVEDP with recommendation to continue medical therapy.  Continue medical therapy including aspirin  81 mg daily, Pravachol  40 mg daily, and as needed nitroglycerin .   3.  Mild calcific aortic stenosis by echocardiogram in February, mean AV gradient 10 mmHg and dimensionless index 0.49.  Unlikely to be a component of his symptoms.  . 4.  Mixed hyperlipidemia.  LDL 66 in February.  Continue Pravachol  40 mg daily.   5.  Primary hypertension.  Disposition:  Follow up test results.  Signed, Jayson JUDITHANN Sierras, M.D., F.A.C.C. [facility] at [facility]

## 2024-05-16 NOTE — Patient Instructions (Signed)
 Medication Instructions:  Your physician recommends that you continue on your current medications as directed. Please refer to the Current Medication list given to you today.  *If you need a refill on your cardiac medications before your next appointment, please call your pharmacy*  Lab Work: NONE   If you have labs (blood work) drawn today and your tests are completely normal, you will receive your results only by: MyChart Message (if you have MyChart) OR A paper copy in the mail If you have any lab test that is abnormal or we need to change your treatment, we will call you to review the results.  Testing/Procedures: ZIO XT- Long Term Monitor Instructions   Your physician has requested you wear your ZIO patch monitor___14____days.   This is a single patch monitor.  [employer] supplies one patch monitor per enrollment.  Additional stickers are not available.   Please do not apply patch if you will be having a Nuclear Stress Test, Echocardiogram, Cardiac CT, MRI, or Chest Xray during the time frame you would be wearing the monitor. The patch cannot be worn during these tests.  You cannot remove and re-apply the ZIO XT patch monitor.   Your ZIO patch monitor will be sent USPS Priority mail from [facility] directly to your home address. The monitor may also be mailed to a PO BOX if home delivery is not available.   It may take 3-5 days to receive your monitor after you have been enrolled.   Once you have received you monitor, please review enclosed instructions.  Your monitor has already been registered assigning a specific monitor serial # to you.   Applying the monitor   Shave hair from upper left chest.   Hold abrader disc by orange tab.  Rub abrader in 40 strokes over left upper chest as indicated in your monitor instructions.   Clean area with 4 enclosed alcohol pads .  Use all pads to assure are is cleaned thoroughly.  Let dry.   Apply patch as indicated in monitor  instructions.  Patch will be place under collarbone on left side of chest with arrow pointing upward.   Rub patch adhesive wings for 2 minutes.Remove white label marked 1.  Remove white label marked 2.  Rub patch adhesive wings for 2 additional minutes.   While looking in a mirror, press and release button in center of patch.  A small green light will flash 3-4 times .  This will be your only indicator the monitor has been turned on.     Do not shower for the first 24 hours.  You may shower after the first 24 hours.   Press button if you feel a symptom. You will hear a small click.  Record Date, Time and Symptom in the Patient Log Book.   When you are ready to remove patch, follow instructions on last 2 pages of Patient Log Book.  Stick patch monitor onto last page of Patient Log Book.   Place Patient Log Book in [place] box.  Use locking tab on box and tape box closed securely.  The [employer] and [employer] has [employer] on it.  Please place in mailbox as soon as possible.  Your physician should have your test results approximately 7 days after the monitor has been mailed back to [facility].   Call [facility] Customer Care at [phone] if you have questions regarding your ZIO XT patch monitor.  Call them immediately if you see an orange light blinking  on your monitor.   If your monitor falls off in less than 4 days contact our Monitor department at [phone].  If your monitor becomes loose or falls off after 4 days call [employer] at [phone] for suggestions on securing your monitor.    Follow-Up: At [facility], you and your health needs are our priority.  As part of our continuing mission to provide you with exceptional heart care, our providers are all part of one team.  This team includes your primary Cardiologist (physician) and Advanced Practice Providers or APPs (Physician Assistants and Nurse Practitioners) who all work together to provide you with the care  you need, when you need it.  Your next appointment:    To Be Determined   Provider:   Jayson Sierras, MD    We recommend signing up for the patient portal called MyChart.  Sign up information is provided on this After Visit Summary.  MyChart is used to connect with patients for Virtual Visits (Telemedicine).  Patients are able to view lab/test results, encounter notes, upcoming appointments, etc.  Non-urgent messages can be sent to your provider as well.   To learn more about what you can do with MyChart, go to [email or web address].   Other Instructions Thank you for choosing [facility]!

## 2024-05-22 ENCOUNTER — Other Ambulatory Visit: Payer: Self-pay | Admitting: Urology

## 2024-05-22 DIAGNOSIS — N390 Urinary tract infection, site not specified: Secondary | ICD-10-CM | POA: Diagnosis not present

## 2024-05-22 DIAGNOSIS — N201 Calculus of ureter: Secondary | ICD-10-CM | POA: Diagnosis not present

## 2024-05-23 ENCOUNTER — Telehealth: Payer: Self-pay | Admitting: Cardiology

## 2024-05-23 ENCOUNTER — Encounter (HOSPITAL_COMMUNITY): Payer: Self-pay | Admitting: Urology

## 2024-05-23 DIAGNOSIS — N201 Calculus of ureter: Secondary | ICD-10-CM | POA: Diagnosis not present

## 2024-05-23 DIAGNOSIS — N390 Urinary tract infection, site not specified: Secondary | ICD-10-CM | POA: Diagnosis not present

## 2024-05-23 NOTE — Progress Notes (Signed)
 Spoke with Zona at [facility] to inform her that per [facility], patient will need cardiac clearance and recent EKG before he can have Lithotripsy. Zona referred me to [place], and I left voicemail for Shona to call [phone] today, until 2:00 pm.

## 2024-05-23 NOTE — Telephone Encounter (Signed)
 Correction: procedure date is 05/25/24.  Will update the Preop APP

## 2024-05-23 NOTE — Telephone Encounter (Signed)
 Surgery date is listed as May 16, 2024. Is this the date?  Cannot clear past surgical procedure.Please clarify.

## 2024-05-23 NOTE — Telephone Encounter (Signed)
 Dr. Debera  You saw this patient on 05/16/2024. Per protocol we request that you comment on his cardiac risk to proceed with Lithotripsy on 05/25/2024 since it has been less than 2 months since evaluated in the office. Please send your comment to P CV Pre-Op Pool.  Thank you, Lamarr Satterfield DNP, ANP, AACC.

## 2024-05-23 NOTE — Progress Notes (Signed)
 Spoke w/ via phone for pre-op interview---patient and his spouse- Brandon [name] needs dos-KUB         COVID test ---Not indicated--patient states asymptomatic no test needed Arrive at 0915 NPO after MN  Pre-Surgery Ensure or G2: Med rec completed Medications to take morning of surgery -Norvasc , pravachol , flomax , protonix , proscar, mestinon , Tylenol , Tramadol , if needed  Patient instructed no nail polish to be worn day of surgery Patient instructed to bring photo id and insurance card day of surgery Patient aware to have Driver (ride ) / caregiver    for 24 hours after surgery - spouse- Brandon [phone] Patient Special Instructions ---On Thursday, take laxative of choice, hydrate well, eat light meal that evening, bring blue folder from [facility] Pre-Op special Instructions --per Norita Dustman / Litho  Patient verbalized understanding of instructions that were given at this phone interview. Patient denies chest pain, sob, fever, cough at the interview. Patient has seen cardiology due to having recent episodes of diaphoresis, nausea, weakness in legs and possibly some arrythmias, and is to wear a monitor for 3-14 days. According to patient and his spouse, his cardiologist does not know he is scheduled for ESWL. [employer], [place], notified and will need cardiac clearance and recent EKG prior to ESWL. [facility] notified of this.

## 2024-05-23 NOTE — Telephone Encounter (Signed)
   Pre-operative Risk Assessment    Patient Name: Brandon [name]  DOB: Aug 22, 1945 MRN: [id number]   Date of last office visit: 05/16/24 Date of next office visit: n/a    Request for Surgical Clearance    Procedure:  Lithotripsy   Date of Surgery:  Clearance 05/16/24                                Surgeon:  Dr. Alvaro Socks Group or Practice Name:  [facility]  Phone number:  [phone]  Fax number:  [phone]    Type of Clearance Requested:   - Medical  - Pharmacy:  Hold Aspirin      Type of Anesthesia:  Local    Additional requests/questions:    SignedSheffield JONELLE Cable   05/23/2024, 1:24 PM

## 2024-05-23 NOTE — Telephone Encounter (Signed)
   Patient Name: Brandon [name]  DOB: 1945/11/18 MRN: [id number]  Primary Cardiologist: Jayson Sierras, MD  Chart reviewed as part of pre-operative protocol coverage. Given past medical history and time since last visit, based on ACC/AHA guidelines, ELISEO WITHERS is at acceptable risk for the planned procedure without further cardiovascular testing.   Per Dr.McDowell 05/23/2024 Lithotripsy under local anesthesia is a low risk endeavor and should not require any additional cardiac risk stratification. He had a recent cardiac catheterization demonstrating patent bypass grafts. He is undergoing workup for recurring spells with plan to wear Zio patch, if he has this patch on while undergoing lithotripsy it may cause interference in our readings, but other than that he should be able to proceed. Okay to hold aspirin  temporarily as requested.   The patient was advised that if he develops new symptoms prior to surgery to contact our office to arrange for a follow-up visit, and he verbalized understanding.  I will route this recommendation to the requesting party via Epic fax function and remove from pre-op pool.  Please call with questions.  Lamarr Satterfield, NP 05/23/2024, 2:17 PM

## 2024-05-25 ENCOUNTER — Encounter (HOSPITAL_COMMUNITY)
Admission: RE | Disposition: A | Discharge status: Home / Self Care | Payer: Self-pay | Source: Home / Self Care | Attending: Urology

## 2024-05-25 ENCOUNTER — Other Ambulatory Visit: Payer: Self-pay

## 2024-05-25 ENCOUNTER — Ambulatory Visit (HOSPITAL_COMMUNITY)

## 2024-05-25 ENCOUNTER — Encounter (HOSPITAL_COMMUNITY): Payer: Self-pay | Admitting: Urology

## 2024-05-25 ENCOUNTER — Ambulatory Visit (HOSPITAL_COMMUNITY)
Admission: RE | Admit: 2024-05-25 | Discharge: 2024-05-25 | Disposition: A | Discharge status: Home / Self Care | Attending: Urology | Admitting: Urology

## 2024-05-25 DIAGNOSIS — N201 Calculus of ureter: Secondary | ICD-10-CM | POA: Diagnosis present

## 2024-05-25 DIAGNOSIS — Z9049 Acquired absence of other specified parts of digestive tract: Secondary | ICD-10-CM | POA: Diagnosis not present

## 2024-05-25 DIAGNOSIS — N202 Calculus of kidney with calculus of ureter: Secondary | ICD-10-CM | POA: Diagnosis not present

## 2024-05-25 HISTORY — PX: EXTRACORPOREAL SHOCK WAVE LITHOTRIPSY: SHX1557

## 2024-05-25 SURGERY — LITHOTRIPSY, ESWL
Anesthesia: LOCAL | Laterality: Left

## 2024-05-25 MED ORDER — DIPHENHYDRAMINE HCL 25 MG PO CAPS
25.0000 mg | ORAL_CAPSULE | ORAL | Status: AC
Start: 2024-05-25 — End: 2024-05-25
  Administered 2024-05-25: 25 mg via ORAL
  Filled 2024-05-25: qty 1

## 2024-05-25 MED ORDER — DIAZEPAM 5 MG PO TABS: 10.0000 mg | ORAL_TABLET | ORAL | Status: DC

## 2024-05-25 MED ORDER — DIAZEPAM 5 MG PO TABS
5.0000 mg | ORAL_TABLET | ORAL | Status: AC
  Administered 2024-05-25: 5 mg via ORAL
  Filled 2024-05-25: qty 1

## 2024-05-25 MED ORDER — SODIUM CHLORIDE 0.9 % IV SOLN
INTRAVENOUS | Status: DC
  Administered 2024-05-25: 1000 mL via INTRAVENOUS

## 2024-05-25 MED ORDER — CIPROFLOXACIN HCL 500 MG PO TABS
500.0000 mg | ORAL_TABLET | ORAL | Status: AC
Start: 2024-05-25 — End: 2024-05-25
  Administered 2024-05-25: 500 mg via ORAL
  Filled 2024-05-25: qty 1

## 2024-05-25 MED ORDER — ONDANSETRON 8 MG PO TBDP: 8.0000 mg | ORAL_TABLET | Freq: Three times a day (TID) | ORAL | 0 refills | Status: DC | PRN

## 2024-05-25 MED ORDER — HYDROCODONE-ACETAMINOPHEN 5-325 MG PO TABS: 1.0000 | ORAL_TABLET | Freq: Four times a day (QID) | ORAL | 0 refills | Status: DC | PRN

## 2024-05-25 NOTE — H&P (Signed)
 Brandon [name] is an 78 y.o. male.    Chief Complaint: Pre-OP LEFT Shockwave Lithotripsy  HPI:   1 - LEFT Ureteral Stone - 6mm left proxiaml stone on KUB / US  x several 2025 at L4-5 interspace. Minimal flank pain. Multiple non-obstructing stones as well. Follows with Dr. Watt  Today Brandon [name] is seen to proceed with LEFT shockwave lithotrispy. Most recnet resulted UCX non-clonal, has been on bactrim pre/peri-procedure from some bacteruria w/o fevers. Cards clearance on file, has held ASA few days, stone out of renal shadow.   Past Medical History:  Diagnosis Date   Anemia    Arthritis    Coronary atherosclerosis of native coronary artery    a. s/p CABG in 2015 with LIMA-LAD, SVG-PDA and seq SVG-RI-OM b. NST in 09/2021 showing very mild ischemia and a low-risk study   DDD (degenerative disc disease), lumbar    Essential hypertension    GERD (gastroesophageal reflux disease)    Hard of hearing both ears    Left ear is worst   Hemorrhoids    History of gout    History of kidney stones    history of myasthenia Gravis 2021   History of [facility] spotted fever    Left ureteral  & renal stone    Lumbago    Mixed hyperlipidemia    Personal history of COVID-19 08/10/2021   Paxlovid    Plantar fascial fibromatosis    Postoperative atrial fibrillation (HCC)    Skin Cancer (HCC)    Use of cane as ambulatory aid    Uses roller walker    UTI (urinary tract infection) 09/16/2021   Wears glasses     Past Surgical History:  Procedure Laterality Date   AMPUTATION Right 03/27/2014   Procedure: DEBRIDEMENT AND CLOSURE RIGHT INDEX FINGER;  Surgeon: Prentice LELON Pagan, MD;  Location: [facility] OR;  Service: Orthopedics;  Laterality: Right;   BACK SURGERY  12/2021   BICEPT TENODESIS  11/26/2011   Procedure: BICEPT TENODESIS;  Surgeon: Elspeth JONELLE Her, MD;  Location: [facility] OR;  Service: Orthopedics;  Laterality: Left;   BIOPSY  08/03/2018   Procedure: BIOPSY;  Surgeon: Golda Claudis PENNER, MD;  Location:  [facility];  Service: Endoscopy;;  antral   Cataract surgery Bilateral    yrs ago per wife on 10-01-2021   CHOLECYSTECTOMY N/A 09/22/2018   Procedure: LAPAROSCOPIC CHOLECYSTECTOMY;  Surgeon: Kallie Manuelita BROCKS, MD;  Location: [facility] ORS;  Service: General;  Laterality: N/A;   COLONOSCOPY N/A 12/26/2014   Procedure: COLONOSCOPY;  Surgeon: Claudis PENNER Golda, MD;  Location: [facility];  Service: Endoscopy;  Laterality: N/A;  730   CORONARY ARTERY BYPASS GRAFT N/A 10/19/2013   Procedure: CORONARY ARTERY BYPASS GRAFTING (CABG);  Surgeon: Sudie VEAR Laine, MD;  Location: [facility] OR;  Service: Open Heart Surgery;  Laterality: N/A;  CABG times four using left internal mammary artery and left leg saphenous vein, incision made on right leg but no vein removed   CYSTOSCOPY W/ URETERAL STENT PLACEMENT Right 02/22/2022   Procedure: CYSTOSCOPY WITH RETROGRADE PYELOGRAM/URETERAL STENT PLACEMENT;  Surgeon: Devere Lonni Righter, MD;  Location: [facility] ORS;  Service: Urology;  Laterality: Right;   CYSTOSCOPY WITH RETROGRADE PYELOGRAM, URETEROSCOPY AND STENT PLACEMENT Left 08/24/2016   Procedure: CYSTOSCOPY WITH LEFT RETROGRADE PYELOGRAM, LEFT URETEROSCOPY, BASKET EXTRACTION LEFT URETERAL STONE;  Surgeon: Norleen Watt, MD;  Location: [facility]S;  Service: Urology;  Laterality: Left;   CYSTOSCOPY WITH RETROGRADE PYELOGRAM, URETEROSCOPY AND STENT PLACEMENT Bilateral 07/12/2023   Procedure: CYSTOSCOPY  WITH BILATERAL RETROGRADE RIGHT URETEROSCOPY; REMOVAL OF FOREIGN BODIES COMPLEX WITH FULGURATION;  Surgeon: Watt Rush, MD;  Location: [facility]S;  Service: Urology;  Laterality: Bilateral;  1 HR FOR CASE   CYSTOSCOPY/URETEROSCOPY/HOLMIUM LASER/STENT PLACEMENT Left 10/06/2021   Procedure: CYSTOSCOPY LEFT RETROGRADE PYELOGRAM URETEROSCOPY/HOLMIUM LASER/STENT PLACEMENT;  Surgeon: Watt Rush, MD;  Location: [facility];  Service: Urology;  Laterality: Left;   CYSTOSCOPY/URETEROSCOPY/HOLMIUM LASER/STENT PLACEMENT Right  03/01/2022   Procedure: CYSTOSCOPY RIGHT URETEROSCOPY/HOLMIUM LASER/STENT PLACEMENT;  Surgeon: Watt Rush, MD;  Location: [facility];  Service: Urology;  Laterality: Right;   CYSTOSCOPY/URETEROSCOPY/HOLMIUM LASER/STENT PLACEMENT  11/11/2023   Procedure: CYSTOSCOPY/BILATERAL RETROGRADE/RIGHT URETEROSCOPY AND STONE MANIPULATION;  Surgeon: Watt Rush, MD;  Location: [facility] OR;  Service: Urology;;   ESOPHAGOGASTRODUODENOSCOPY N/A 08/03/2018   Procedure: ESOPHAGOGASTRODUODENOSCOPY (EGD);  Surgeon: Golda Claudis PENNER, MD;  [facility];  Service: Endoscopy;  Laterality: N/A;  2:55   EXTRACORPOREAL SHOCK WAVE LITHOTRIPSY Right 06/18/2019   Procedure: EXTRACORPOREAL SHOCK WAVE LITHOTRIPSY (ESWL);  Surgeon: Carolee Sherwood JONETTA DOUGLAS, MD;  Location: [facility];  Service: Urology;  Laterality: Right;   INTRAOPERATIVE TRANSESOPHAGEAL ECHOCARDIOGRAM N/A 10/19/2013   Procedure: INTRAOPERATIVE TRANSESOPHAGEAL ECHOCARDIOGRAM;  Surgeon: Sudie VEAR Laine, MD;  Location: [facility] OR;  Service: Open Heart Surgery;  Laterality: N/A;   KYPHOPLASTY N/A 03/27/2021   Procedure: Lumbar One and Lumbar Two Kyphoplasty;  Surgeon: Unice Pac, MD;  Location: [facility] OR;  Service: Neurosurgery;  Laterality: N/A;   left hand carpal tunnel release  04/04/2015   LEFT HEART CATH AND CORS/GRAFTS ANGIOGRAPHY N/A 04/17/2024   Procedure: LEFT HEART CATH AND CORS/GRAFTS ANGIOGRAPHY;  Surgeon: Wonda Sharper, MD;  Location: [facility] INVASIVE CV LAB;  Service: Cardiovascular;  Laterality: N/A;   LEFT HEART CATHETERIZATION WITH CORONARY ANGIOGRAM N/A 10/09/2013   Procedure: LEFT HEART CATHETERIZATION WITH CORONARY ANGIOGRAM;  Surgeon: Lonni JONETTA Cash, MD;  Location: [facility] CATH LAB;  Service: Cardiovascular;  Laterality: N/A;   Left shoulder rotator cuff repair  12/04/2011   MOHS procedure  01/06/2021   dr samule lunger on left hand   ORIF PERIPROSTHETIC FRACTURE Left 03/19/2020   Procedure: OPEN REDUCTION INTERNAL FIXATION (ORIF) PERIPROSTHETIC FRACTURE LEFT  FEMUR WITH FEMORAL REVISION;  Surgeon: Melodi Lerner, MD;  Location: [facility];  Service: Orthopedics;  Laterality: Left;   REVERSE SHOULDER ARTHROPLASTY Right 10/08/2022   Procedure: REVERSE SHOULDER ARTHROPLASTY;  Surgeon: Kay Kemps, MD;  Location: [facility];  Service: Orthopedics;  Laterality: Right;  general, choice with interscalene block   TOTAL HIP ARTHROPLASTY Left 01/28/2020   Procedure: TOTAL HIP ARTHROPLASTY ANTERIOR APPROACH;  Surgeon: Melodi Lerner, MD;  Location: [facility];  Service: Orthopedics;  Laterality: Left;    TOTAL KNEE ARTHROPLASTY Left 04/18/2001   TOTAL KNEE ARTHROPLASTY Right 05/02/2023   Procedure: TOTAL KNEE ARTHROPLASTY;  Surgeon: Melodi Lerner, MD;  Location: [facility];  Service: Orthopedics;  Laterality: Right;   traumaticvamputation of finger  2015   VASECTOMY     yrs ago per wife on 10-01-2021    Family History  Problem Relation Age of Onset   Hypertension Sister    Lung cancer Father    Heart failure Mother    Social History:  reports that he quit smoking about 40 years ago. His smoking use included cigarettes. He started smoking about 65 years ago. He has a 60 pack-year smoking history. He quit smokeless tobacco use about 40 years ago.  His smokeless tobacco use included chew. He reports that he does not currently use alcohol. He reports that he  does not use drugs.  Allergies:  Allergies  Allergen Reactions   Lopressor  [Metoprolol ] Other (See Comments)    Bradycardia at low doses (12.5mg  BID)   Neurontin [Gabapentin] Other (See Comments)    Hallucinations   Oxycontin  [Oxycodone ] Other (See Comments)    Confusion     No medications prior to admission.    No results found for this or any previous visit (from the past 48 hours). No results found.  Review of Systems  Constitutional:  Negative for chills and fever.  Genitourinary:  Positive for flank pain.  All other systems reviewed and are negative.   There were no vitals taken for this  visit. Physical Exam Vitals reviewed.  HENT:     Head: Normocephalic.     Mouth/Throat:     Mouth: Mucous membranes are moist.  Eyes:     Pupils: Pupils are equal, round, and reactive to light.  Cardiovascular:     Rate and Rhythm: Normal rate.  Pulmonary:     Effort: Pulmonary effort is normal.  Abdominal:     General: Abdomen is flat.  Genitourinary:    Comments: Minimal left CVAT Musculoskeletal:        General: Normal range of motion.     Cervical back: Normal range of motion.  Neurological:     General: No focal deficit present.     Mental Status: He is alert.  Psychiatric:        Mood and Affect: Mood normal.      Assessment/Plan  Proceed as planned with LEFT shockwave lithotripsy. Risks, benfits, alternatives, expedcted peri-treatmetn course discussed. He understant that this will not render him stone free.   Ricardo KATHEE Alvaro Mickey., MD 05/25/2024, 7:14 AM

## 2024-05-25 NOTE — Discharge Instructions (Addendum)
1 - You may have urinary urgency (bladder spasms), pass small stone fragments, and bloody urine on / off for up to 2 weeks. This is normal. ° °2 - Call MD or go to ER for fever >102, severe pain / nausea / vomiting not relieved by medications, or acute change in medical status ° °

## 2024-05-25 NOTE — Brief Op Note (Signed)
 05/25/2024  12:24 PM  PATIENT:  Zachary DELENA Pepper  78 y.o. male  PRE-OPERATIVE DIAGNOSIS:  LEFT URETERAL STONE  POST-OPERATIVE DIAGNOSIS:  * No post-op diagnosis entered *  PROCEDURE:  Procedure(s): LITHOTRIPSY, ESWL (Left)  SURGEON:  Surgeons and Role:    * Manny, Ricardo KATHEE Raddle., MD - Primary  PHYSICIAN ASSISTANT:   ASSISTANTS: none   ANESTHESIA:   MAC  EBL:  minimal   BLOOD ADMINISTERED:none  DRAINS: none   LOCAL MEDICATIONS USED:  NONE  SPECIMEN:  No Specimen  DISPOSITION OF SPECIMEN:  N/A  COUNTS:  YES  TOURNIQUET:  * No tourniquets in log *  DICTATION: .Note written in paper chart  PLAN OF CARE: Discharge to home after PACU  PATIENT DISPOSITION:  Short Stay   Delay start of Pharmacological VTE agent (>24hrs) due to surgical blood loss or risk of bleeding: yes

## 2024-05-29 ENCOUNTER — Encounter (HOSPITAL_COMMUNITY): Payer: Self-pay | Admitting: Urology

## 2024-06-12 DIAGNOSIS — N200 Calculus of kidney: Secondary | ICD-10-CM | POA: Diagnosis not present

## 2024-06-12 DIAGNOSIS — N390 Urinary tract infection, site not specified: Secondary | ICD-10-CM | POA: Diagnosis not present

## 2024-06-18 ENCOUNTER — Telehealth: Payer: Self-pay | Admitting: Cardiology

## 2024-06-18 ENCOUNTER — Ambulatory Visit: Attending: Cardiology

## 2024-06-18 DIAGNOSIS — R42 Dizziness and giddiness: Secondary | ICD-10-CM

## 2024-06-18 NOTE — Telephone Encounter (Signed)
 Patients wife came in and said her husband never received the zio monitor. Please advise

## 2024-06-18 NOTE — Telephone Encounter (Signed)
 Monitor re-enrolled in [employer]. Message left for patient to call office if monitor is not received by Friday 11/7.

## 2024-07-02 ENCOUNTER — Encounter (HOSPITAL_COMMUNITY): Payer: Self-pay | Admitting: Urology

## 2024-07-02 ENCOUNTER — Encounter (HOSPITAL_COMMUNITY)
Admission: RE | Disposition: A | Discharge status: Home / Self Care | Payer: Self-pay | Source: Ambulatory Visit | Attending: Urology

## 2024-07-02 ENCOUNTER — Ambulatory Visit (HOSPITAL_COMMUNITY)
Admission: RE | Admit: 2024-07-02 | Discharge: 2024-07-02 | Disposition: A | Discharge status: Home / Self Care | Source: Ambulatory Visit | Attending: Urology | Admitting: Urology

## 2024-07-02 ENCOUNTER — Ambulatory Visit (HOSPITAL_COMMUNITY): Admitting: Certified Registered Nurse Anesthetist

## 2024-07-02 ENCOUNTER — Other Ambulatory Visit: Payer: Self-pay | Admitting: Urology

## 2024-07-02 ENCOUNTER — Ambulatory Visit (HOSPITAL_COMMUNITY)

## 2024-07-02 ENCOUNTER — Other Ambulatory Visit: Payer: Self-pay

## 2024-07-02 DIAGNOSIS — N390 Urinary tract infection, site not specified: Secondary | ICD-10-CM | POA: Diagnosis not present

## 2024-07-02 DIAGNOSIS — Z01818 Encounter for other preprocedural examination: Secondary | ICD-10-CM

## 2024-07-02 DIAGNOSIS — I10 Essential (primary) hypertension: Secondary | ICD-10-CM | POA: Diagnosis not present

## 2024-07-02 DIAGNOSIS — Z951 Presence of aortocoronary bypass graft: Secondary | ICD-10-CM | POA: Insufficient documentation

## 2024-07-02 DIAGNOSIS — N201 Calculus of ureter: Secondary | ICD-10-CM

## 2024-07-02 DIAGNOSIS — I252 Old myocardial infarction: Secondary | ICD-10-CM | POA: Diagnosis not present

## 2024-07-02 DIAGNOSIS — I2510 Atherosclerotic heart disease of native coronary artery without angina pectoris: Secondary | ICD-10-CM | POA: Insufficient documentation

## 2024-07-02 DIAGNOSIS — E782 Mixed hyperlipidemia: Secondary | ICD-10-CM | POA: Diagnosis not present

## 2024-07-02 DIAGNOSIS — Z87891 Personal history of nicotine dependence: Secondary | ICD-10-CM

## 2024-07-02 DIAGNOSIS — N202 Calculus of kidney with calculus of ureter: Secondary | ICD-10-CM | POA: Diagnosis not present

## 2024-07-02 DIAGNOSIS — G7000 Myasthenia gravis without (acute) exacerbation: Secondary | ICD-10-CM | POA: Diagnosis not present

## 2024-07-02 HISTORY — PX: CYSTOSCOPY W/ URETERAL STENT PLACEMENT: SHX1429

## 2024-07-02 LAB — CBC
HCT: 43.4 % (ref 39.0–52.0)
Hemoglobin: 14.1 g/dL (ref 13.0–17.0)
MCH: 29.7 pg (ref 26.0–34.0)
MCHC: 32.5 g/dL (ref 30.0–36.0)
MCV: 91.6 fL (ref 80.0–100.0)
Platelets: 211 K/uL (ref 150–400)
RBC: 4.74 MIL/uL (ref 4.22–5.81)
RDW: 13.5 % (ref 11.5–15.5)
WBC: 6.7 K/uL (ref 4.0–10.5)
nRBC: 0 % (ref 0.0–0.2)

## 2024-07-02 LAB — BASIC METABOLIC PANEL WITH GFR
Anion gap: 11 (ref 5–15)
BUN: 14 mg/dL (ref 8–23)
CO2: 25 mmol/L (ref 22–32)
Calcium: 9.4 mg/dL (ref 8.9–10.3)
Chloride: 104 mmol/L (ref 98–111)
Creatinine, Ser: 0.99 mg/dL (ref 0.61–1.24)
GFR, Estimated: >60 mL/min (ref >60)
Glucose, Bld: 92 mg/dL (ref 70–99)
Potassium: 3.9 mmol/L (ref 3.5–5.1)
Sodium: 140 mmol/L (ref 135–145)

## 2024-07-02 SURGERY — CYSTOSCOPY, FLEXIBLE, WITH STENT REPLACEMENT
Anesthesia: General | Laterality: Left

## 2024-07-02 MED ORDER — HYDROCODONE-ACETAMINOPHEN 5-325 MG PO TABS: 1.0000 | ORAL_TABLET | ORAL | Status: DC | PRN

## 2024-07-02 MED ORDER — ONDANSETRON HCL 4 MG/2ML IJ SOLN: 4.0000 mg | Freq: Once | INTRAMUSCULAR | Status: DC | PRN

## 2024-07-02 MED ORDER — HYDROCODONE-ACETAMINOPHEN 5-325 MG PO TABS: 1.0000 | ORAL_TABLET | Freq: Four times a day (QID) | ORAL | 0 refills | Status: AC | PRN

## 2024-07-02 MED ORDER — CEFAZOLIN SODIUM-DEXTROSE 2-4 GM/100ML-% IV SOLN
INTRAVENOUS | Status: AC
  Filled 2024-07-02: qty 100

## 2024-07-02 MED ORDER — DEXAMETHASONE SOD PHOSPHATE PF 10 MG/ML IJ SOLN
INTRAMUSCULAR | Status: DC | PRN
  Administered 2024-07-02: 5 mg via INTRAVENOUS

## 2024-07-02 MED ORDER — DROPERIDOL 2.5 MG/ML IJ SOLN: 0.6250 mg | Freq: Once | INTRAMUSCULAR | Status: DC | PRN

## 2024-07-02 MED ORDER — HYDROMORPHONE HCL 1 MG/ML IJ SOLN: 0.2500 mg | INTRAMUSCULAR | Status: DC | PRN

## 2024-07-02 MED ORDER — FENTANYL CITRATE (PF) 100 MCG/2ML IJ SOLN
INTRAMUSCULAR | Status: AC
  Filled 2024-07-02: qty 2

## 2024-07-02 MED ORDER — LIDOCAINE 2% (20 MG/ML) 5 ML SYRINGE
INTRAMUSCULAR | Status: DC | PRN
  Administered 2024-07-02: 100 mg via INTRAVENOUS

## 2024-07-02 MED ORDER — CEFAZOLIN SODIUM-DEXTROSE 2-4 GM/100ML-% IV SOLN
2.0000 g | INTRAVENOUS | Status: AC
  Administered 2024-07-02: 2 g via INTRAVENOUS

## 2024-07-02 MED ORDER — EPHEDRINE SULFATE-NACL 50-0.9 MG/10ML-% IV SOSY
PREFILLED_SYRINGE | INTRAVENOUS | Status: DC | PRN
Start: 2024-07-02 — End: 2024-07-02
  Administered 2024-07-02 (×2): 5 mg via INTRAVENOUS

## 2024-07-02 MED ORDER — SODIUM CHLORIDE 0.9 % IR SOLN
Status: DC | PRN
  Administered 2024-07-02: 3000 mL via INTRAVESICAL

## 2024-07-02 MED ORDER — LACTATED RINGERS IV SOLN: INTRAVENOUS | Status: DC

## 2024-07-02 MED ORDER — ORAL CARE MOUTH RINSE: 15.0000 mL | Freq: Once | OROMUCOSAL | Status: AC

## 2024-07-02 MED ORDER — SODIUM CHLORIDE 0.9% FLUSH: 3.0000 mL | Freq: Two times a day (BID) | INTRAVENOUS | Status: DC

## 2024-07-02 MED ORDER — CHLORHEXIDINE GLUCONATE 0.12 % MT SOLN
15.0000 mL | Freq: Once | OROMUCOSAL | Status: AC
  Administered 2024-07-02: 15 mL via OROMUCOSAL

## 2024-07-02 MED ORDER — ONDANSETRON HCL 4 MG/2ML IJ SOLN
INTRAMUSCULAR | Status: DC | PRN
  Administered 2024-07-02: 4 mg via INTRAVENOUS

## 2024-07-02 MED ORDER — PROPOFOL 10 MG/ML IV BOLUS
INTRAVENOUS | Status: DC | PRN
Start: 2024-07-02 — End: 2024-07-02
  Administered 2024-07-02: 120 mg via INTRAVENOUS

## 2024-07-02 MED ORDER — FENTANYL CITRATE (PF) 250 MCG/5ML IJ SOLN
INTRAMUSCULAR | Status: DC | PRN
  Administered 2024-07-02: 50 ug via INTRAVENOUS
  Administered 2024-07-02: 25 ug via INTRAVENOUS

## 2024-07-02 SURGICAL SUPPLY — 11 items
BAG URO CATCHER STRL LF (MISCELLANEOUS) ×1 IMPLANT
CATH URETL OPEN 5X70 (CATHETERS) IMPLANT
CLOTH BEACON ORANGE TIMEOUT ST (SAFETY) ×1 IMPLANT
GLOVE SURG SS PI 8.0 STRL IVOR (GLOVE) IMPLANT
GOWN STRL SURGICAL XL XLNG (GOWN DISPOSABLE) ×1 IMPLANT
GUIDEWIRE STR DUAL SENSOR (WIRE) ×1 IMPLANT
KIT TURNOVER KIT A (KITS) ×1 IMPLANT
MANIFOLD NEPTUNE II (INSTRUMENTS) ×1 IMPLANT
PACK CYSTO (CUSTOM PROCEDURE TRAY) ×1 IMPLANT
STENT URET 6FRX26 CONTOUR (STENTS) IMPLANT
TUBING CONNECTING 10 (TUBING) ×1 IMPLANT

## 2024-07-02 NOTE — H&P (Signed)
 Subjective:    07/02/2024: He presents today with concerns of severe flank pain. The pain is worse on the right side but radiates to the left. He recently underwent ESWL for a left-sided ureteral stone. He did pass some pieces of stone material and had a recent KUB and renal ultrasound which showed multiple nonobstructive calcifications bilaterally but no hydronephrosis or ureteral calculi. He states over the last 2 weeks his pain has intensified and is severe. It is not controlled with Tylenol  at home. He does have tramadol  that does help with some of the pain. He endorses intense urgency of urination. Denies fevers and chills  He was found to have a 5 mm left distal stone fragment with obstruction and a UA that is concerning for infection  He is to have placement of a ureteral stent today.  ROS:  Review of Systems  Constitutional:  Negative for chills and fever.  Gastrointestinal:  Negative for nausea and vomiting.  Genitourinary:  Positive for flank pain and urgency.    Allergies  Allergen Reactions   Lopressor  [Metoprolol ] Other (See Comments)    Bradycardia at low doses (12.5mg  BID)   Neurontin [Gabapentin] Other (See Comments)    Hallucinations   Oxycontin  [Oxycodone ] Other (See Comments)    Confusion     Past Medical History:  Diagnosis Date   Anemia    Arthritis    Coronary atherosclerosis of native coronary artery    a. s/p CABG in 2015 with LIMA-LAD, SVG-PDA and seq SVG-RI-OM b. NST in 09/2021 showing very mild ischemia and a low-risk study   DDD (degenerative disc disease), lumbar    Essential hypertension    GERD (gastroesophageal reflux disease)    Hard of hearing both ears    Left ear is worst   Hemorrhoids    History of gout    History of kidney stones    history of myasthenia Gravis 2021   History of [facility] spotted fever    Left ureteral  & renal stone    Lumbago    Mixed hyperlipidemia    Personal history of COVID-19 08/10/2021   Paxlovid     Plantar fascial fibromatosis    Postoperative atrial fibrillation (HCC)    Skin Cancer (HCC)    Use of cane as ambulatory aid    Uses roller walker    UTI (urinary tract infection) 09/16/2021   Wears glasses     Past Surgical History:  Procedure Laterality Date   AMPUTATION Right 03/27/2014   Procedure: DEBRIDEMENT AND CLOSURE RIGHT INDEX FINGER;  Surgeon: Prentice LELON Pagan, MD;  Location: [facility] OR;  Service: Orthopedics;  Laterality: Right;   BACK SURGERY  12/2021   BICEPT TENODESIS  11/26/2011   Procedure: BICEPT TENODESIS;  Surgeon: Elspeth JONELLE Her, MD;  Location: [facility] OR;  Service: Orthopedics;  Laterality: Left;   BIOPSY  08/03/2018   Procedure: BIOPSY;  Surgeon: Golda Claudis PENNER, MD;  Location: [facility] SUITE;  Service: Endoscopy;;  antral   Cataract surgery Bilateral    yrs ago per wife on 10-01-2021   CHOLECYSTECTOMY N/A 09/22/2018   Procedure: LAPAROSCOPIC CHOLECYSTECTOMY;  Surgeon: Kallie Manuelita BROCKS, MD;  Location: [facility] ORS;  Service: General;  Laterality: N/A;   COLONOSCOPY N/A 12/26/2014   Procedure: COLONOSCOPY;  Surgeon: Claudis PENNER Golda, MD;  Location: [facility];  Service: Endoscopy;  Laterality: N/A;  730   CORONARY ARTERY BYPASS GRAFT N/A 10/19/2013   Procedure: CORONARY ARTERY BYPASS GRAFTING (CABG);  Surgeon: Sudie VEAR Laine, MD;  Location: [facility] OR;  Service: Open Heart Surgery;  Laterality: N/A;  CABG times four using left internal mammary artery and left leg saphenous vein, incision made on right leg but no vein removed   CYSTOSCOPY W/ URETERAL STENT PLACEMENT Right 02/22/2022   Procedure: CYSTOSCOPY WITH RETROGRADE PYELOGRAM/URETERAL STENT PLACEMENT;  Surgeon: Devere Lonni Righter, MD;  Location: [facility] ORS;  Service: Urology;  Laterality: Right;   CYSTOSCOPY WITH RETROGRADE PYELOGRAM, URETEROSCOPY AND STENT PLACEMENT Left 08/24/2016   Procedure: CYSTOSCOPY WITH LEFT RETROGRADE PYELOGRAM, LEFT URETEROSCOPY, BASKET EXTRACTION LEFT URETERAL STONE;  Surgeon: Norleen Seltzer, MD;   Location: [facility] ORS;  Service: Urology;  Laterality: Left;   CYSTOSCOPY WITH RETROGRADE PYELOGRAM, URETEROSCOPY AND STENT PLACEMENT Bilateral 07/12/2023   Procedure: CYSTOSCOPY WITH BILATERAL RETROGRADE RIGHT URETEROSCOPY; REMOVAL OF FOREIGN BODIES COMPLEX WITH FULGURATION;  Surgeon: Seltzer Norleen, MD;  Location: [facility] ORS;  Service: Urology;  Laterality: Bilateral;  1 HR FOR CASE   CYSTOSCOPY/URETEROSCOPY/HOLMIUM LASER/STENT PLACEMENT Left 10/06/2021   Procedure: CYSTOSCOPY LEFT RETROGRADE PYELOGRAM URETEROSCOPY/HOLMIUM LASER/STENT PLACEMENT;  Surgeon: Seltzer Norleen, MD;  Location: [facility];  Service: Urology;  Laterality: Left;   CYSTOSCOPY/URETEROSCOPY/HOLMIUM LASER/STENT PLACEMENT Right 03/01/2022   Procedure: CYSTOSCOPY RIGHT URETEROSCOPY/HOLMIUM LASER/STENT PLACEMENT;  Surgeon: Seltzer Norleen, MD;  Location: [facility];  Service: Urology;  Laterality: Right;   CYSTOSCOPY/URETEROSCOPY/HOLMIUM LASER/STENT PLACEMENT  11/11/2023   Procedure: CYSTOSCOPY/BILATERAL RETROGRADE/RIGHT URETEROSCOPY AND STONE MANIPULATION;  Surgeon: Seltzer Norleen, MD;  Location: [facility] OR;  Service: Urology;;   ESOPHAGOGASTRODUODENOSCOPY N/A 08/03/2018   Procedure: ESOPHAGOGASTRODUODENOSCOPY (EGD);  Surgeon: Golda Claudis PENNER, MD;  [facility];  Service: Endoscopy;  Laterality: N/A;  2:55   EXTRACORPOREAL SHOCK WAVE LITHOTRIPSY Right 06/18/2019   Procedure: EXTRACORPOREAL SHOCK WAVE LITHOTRIPSY (ESWL);  Surgeon: Carolee Sherwood JONETTA DOUGLAS, MD;  Location: [facility];  Service: Urology;  Laterality: Right;   EXTRACORPOREAL SHOCK WAVE LITHOTRIPSY Left 05/25/2024   Procedure: LITHOTRIPSY, ESWL;  Surgeon: Alvaro Ricardo KATHEE Mickey., MD;  Location: [facility] ORS;  Service: Urology;  Laterality: Left;   INTRAOPERATIVE TRANSESOPHAGEAL ECHOCARDIOGRAM N/A 10/19/2013   Procedure: INTRAOPERATIVE TRANSESOPHAGEAL ECHOCARDIOGRAM;  Surgeon: Sudie VEAR Laine, MD;  Location: [facility] OR;  Service: Open Heart Surgery;  Laterality: N/A;   KYPHOPLASTY N/A 03/27/2021    Procedure: Lumbar One and Lumbar Two Kyphoplasty;  Surgeon: Unice Pac, MD;  Location: [facility] OR;  Service: Neurosurgery;  Laterality: N/A;   left hand carpal tunnel release  04/04/2015   LEFT HEART CATH AND CORS/GRAFTS ANGIOGRAPHY N/A 04/17/2024   Procedure: LEFT HEART CATH AND CORS/GRAFTS ANGIOGRAPHY;  Surgeon: Wonda Sharper, MD;  Location: [facility] INVASIVE CV LAB;  Service: Cardiovascular;  Laterality: N/A;   LEFT HEART CATHETERIZATION WITH CORONARY ANGIOGRAM N/A 10/09/2013   Procedure: LEFT HEART CATHETERIZATION WITH CORONARY ANGIOGRAM;  Surgeon: Lonni JONETTA Cash, MD;  Location: [facility] CATH LAB;  Service: Cardiovascular;  Laterality: N/A;   Left shoulder rotator cuff repair  12/04/2011   MOHS procedure  01/06/2021   dr samule lunger on left hand   ORIF PERIPROSTHETIC FRACTURE Left 03/19/2020   Procedure: OPEN REDUCTION INTERNAL FIXATION (ORIF) PERIPROSTHETIC FRACTURE LEFT FEMUR WITH FEMORAL REVISION;  Surgeon: Melodi Lerner, MD;  Location: [facility] ORS;  Service: Orthopedics;  Laterality: Left;   REVERSE SHOULDER ARTHROPLASTY Right 10/08/2022   Procedure: REVERSE SHOULDER ARTHROPLASTY;  Surgeon: Kay Kemps, MD;  Location: [facility] ORS;  Service: Orthopedics;  Laterality: Right;  general, choice with interscalene block   TOTAL HIP ARTHROPLASTY Left 01/28/2020   Procedure: TOTAL HIP ARTHROPLASTY ANTERIOR APPROACH;  Surgeon: Melodi Lerner, MD;  Location: [facility] ORS;  Service: Orthopedics;  Laterality: Left;    TOTAL KNEE ARTHROPLASTY Left 04/18/2001   TOTAL KNEE ARTHROPLASTY Right 05/02/2023   Procedure: TOTAL KNEE ARTHROPLASTY;  Surgeon: Melodi Lerner, MD;  Location: [facility] ORS;  Service: Orthopedics;  Laterality: Right;   traumaticvamputation of finger  2015   VASECTOMY     yrs ago per wife on 10-01-2021    Social History   Socioeconomic History   Marital status: Married    Spouse name: [name]   Number of children: 2   Years of education: Not on file   Highest education level: 10th grade   Occupational History   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 [employer]  Tobacco Use   Smoking status: Former    Current packs/day: 0.00    Average packs/day: 2.0 packs/day for 30.0 years (60.0 ttl pk-yrs)    Types: Cigarettes    Start date: 08/16/1958    Quit date: 08/17/1983    Years since quitting: 40.9   Smokeless tobacco: Former    Types: Chew    Quit date: 1985   Tobacco comments:    quit smoking 34yrs ago  Vaping Use   Vaping status: Never Used  Substance and Sexual Activity   Alcohol use: Not Currently    Comment: none in 4 years   Drug use: No   Sexual activity: Not on file  Other Topics Concern   Not on file  Social History Narrative   Lives with wife   Caffeine- coffee 3- 4 c daily   Social Drivers of Health   Financial Resource Strain: Low Risk  (04/21/2022)   Overall Financial Resource Strain (CARDIA)    Difficulty of Paying Living Expenses: Not hard at all  Food Insecurity: No Food Insecurity (05/02/2023)   Hunger Vital Sign    Worried About Running Out of Food in the Last Year: Never true    Ran Out of Food in the Last Year: Never true  Transportation Needs: No Transportation Needs (05/02/2023)   PRAPARE - Administrator, Civil Service (Medical): No    Lack of Transportation (Non-Medical): No  Physical Activity: Not on file  Stress: Not on file  Social Connections: Not on file  Intimate Partner Violence: Not At Risk (05/02/2023)   Humiliation, Afraid, Rape, and Kick questionnaire    Fear of Current or Ex-Partner: No    Emotionally Abused: No    Physically Abused: No    Sexually Abused: No    Family History  Problem Relation Age of Onset   Hypertension Sister    Lung cancer Father    Heart failure Mother     Anti-infectives: Anti-infectives (From admission, onward)    Start     Dose/Rate Route Frequency Ordered Stop   07/02/24 1323  ceFAZolin  (ANCEF ) 2-4 GM/100ML-% IVPB       Note to Pharmacy: Sonny Mays B: cabinet override      07/02/24 1323 07/03/24  0129   07/02/24 1322  ceFAZolin  (ANCEF ) IVPB 2g/100 mL premix        2 g 200 mL/hr over 30 Minutes Intravenous 30 min pre-op 07/02/24 1322         Current Facility-Administered Medications  Medication Dose Route Frequency Provider Last Rate Last Admin   ceFAZolin  (ANCEF ) 2-4 GM/100ML-% IVPB            ceFAZolin  (ANCEF ) IVPB 2g/100 mL premix  2 g Intravenous 30 min Pre-Op Watt Rush, MD       lactated ringers  infusion  Intravenous Continuous Jerrye Sharper, MD 10 mL/hr at 07/02/24 1329 New Bag at 07/02/24 1329     Objective: Vital signs in last 24 hours: BP (!) 148/85   Pulse (!) 51   Temp 98.3 F (36.8 C) (Oral)   Resp 17   Ht 6' 1 (1.854 m)   Wt 82.1 kg   SpO2 98%   BMI 23.88 kg/m   Intake/Output from previous day: No intake/output data recorded. Intake/Output this shift: No intake/output data recorded.   Physical Exam Vitals reviewed.  Constitutional:      Appearance: Normal appearance.  Cardiovascular:     Rate and Rhythm: Regular rhythm. Bradycardia present.  Pulmonary:     Effort: Pulmonary effort is normal. No respiratory distress.  Neurological:     Mental Status: He is alert.     Lab Results:  Results for orders placed or performed during the hospital encounter of 07/02/24 (from the past 24 hours)  CBC     Status: None   Collection Time: 07/02/24  1:33 PM  Result Value Ref Range   WBC 6.7 4.0 - 10.5 K/uL   RBC 4.74 4.22 - 5.81 MIL/uL   Hemoglobin 14.1 13.0 - 17.0 g/dL   HCT 56.5 60.9 - 47.9 %   MCV 91.6 80.0 - 100.0 fL   MCH 29.7 26.0 - 34.0 pg   MCHC 32.5 30.0 - 36.0 g/dL   RDW 86.4 88.4 - 84.4 %   Platelets 211 150 - 400 K/uL   nRBC 0.0 0.0 - 0.2 %  Basic metabolic panel     Status: None   Collection Time: 07/02/24  1:33 PM  Result Value Ref Range   Sodium 140 135 - 145 mmol/L   Potassium 3.9 3.5 - 5.1 mmol/L   Chloride 104 98 - 111 mmol/L   CO2 25 22 - 32 mmol/L   Glucose, Bld 92 70 - 99 mg/dL   BUN 14 8 - 23 mg/dL   Creatinine,  Ser 9.00 0.61 - 1.24 mg/dL   Calcium 9.4 8.9 - 89.6 mg/dL   GFR, Estimated >39 >39 mL/min   Anion gap 11 5 - 15    BMET Recent Labs    07/02/24 1333  NA 140  K 3.9  CL 104  CO2 25  GLUCOSE 92  BUN 14  CREATININE 0.99  CALCIUM 9.4   PT/INR No results for input(s): LABPROT, INR in the last 72 hours. ABG No results for input(s): PHART, HCO3 in the last 72 hours.  Invalid input(s): PCO2, PO2  Studies/Results: No results found.   Assessment/Plan: 5mm left distal stone with obstruction and possible UTI.  I will take him for cystoscopy with left ureteral stent insertion.  Risks reviewed in detail.    Meds ordered this encounter  Medications   lactated ringers  infusion   OR Linked Order Group    chlorhexidine  (PERIDEX ) 0.12 % solution 15 mL    Oral care mouth rinse   ceFAZolin  (ANCEF ) IVPB 2g/100 mL premix    Indication::   Surgical Prophylaxis   ceFAZolin  (ANCEF ) 2-4 GM/100ML-% IVPB    Sonny Mays B: cabinet override     Orders Placed This Encounter  Procedures   CBC    Standing Status:   Standing    Number of Occurrences:   1   Basic metabolic panel    Standing Status:   Standing    Number of Occurrences:   1   Diet NPO time specified    Standing Status:  Standing    Number of Occurrences:   1   Vital signs Per Protocol    Standing Status:   Standing    Number of Occurrences:   1   Notify physician (specify)    Standing Status:   Standing    Number of Occurrences:   [phone]   Diet Per Protocol    Standing Status:   Standing    Number of Occurrences:   1   Pre-admission testing diagnosis    Standing Status:   Standing    Number of Occurrences:   1    Diagnosis:   Encounter for other preprocedural examination [Z01.818]   Medication Instructions Per Protocol    Standing Status:   Standing    Number of Occurrences:   1   Discharge instructions    Give patient/families discharge instruction brochure or individual surgeons' instructions to  read PREOP.    Standing Status:   Standing    Number of Occurrences:   1   Void    On call to O.R.    Standing Status:   Standing    Number of Occurrences:   1   IV Fluids Per Protocol    Standing Status:   Standing    Number of Occurrences:   1   May use Lidocaine  1% - 2% subcutaneously prior to IV start if patient not allergic to Lidocaine     1% - 2% subcutaneously prior to IV start if patient not allergic to Lidocaine     Standing Status:   Standing    Number of Occurrences:   20   Follow Diabetes Medication Adjustment Guidelines Prior to Procedure and Surgery    Standing Status:   Standing    Number of Occurrences:   1   Anesthesia Preoperative Order    Standing Status:   Standing    Number of Occurrences:   1   Informed Consent Details: Physician/Practitioner Attestation; Transcribe to consent form and obtain patient signature    Standing Status:   Standing    Number of Occurrences:   1    Physician/Practitioner attestation of informed consent for procedure/surgical case:   I, the physician/practitioner, attest that I have discussed with the patient the benefits, risks, side effects, alternatives, likelihood of achieving goals and potential problems during recovery for the procedure that I have provided informed consent.    Procedure:   cystoscopy, left ureteral stent placement    Physician/Practitioner performing the procedure:   Dr Norleen Seltzer    Indication/Reason:   Left kidney stone and ureteral stone   Initiate Pre-op Protocol    Standing Status:   Standing    Number of Occurrences:   1   Pre-admission testing diagnosis    Standing Status:   Standing    Number of Occurrences:   1    Diagnosis:   Encounter for other preprocedural examination [Z01.818]   Labs per anesthesia    Standing Status:   Standing    Number of Occurrences:   1   Chest x-ray per Anesthesia    Standing Status:   Standing    Number of Occurrences:   1   EKG per anesthesia    Standing Status:    Standing    Number of Occurrences:   1   SCD's    Standing Status:   Standing    Number of Occurrences:   1    Laterality:   Bilateral   Continuous pulse oximetry    For  all sedated patients    Standing Status:   Standing    Number of Occurrences:   1   Oxygen therapy Mode or (Route): Nasal cannula; Keep O2 saturation between: >92%    2-4 Liters/min. Titrate as needed.  For all sedated patients    Standing Status:   Standing    Number of Occurrences:   20    Mode or (Route):   Nasal cannula    Keep O2 saturation between:   >92%   Insert and maintain IV line    Nurse to order flush per facility specific protocol.    Standing Status:   Standing    Number of Occurrences:   1     No follow-ups on file.        Maccoy Haubner 07/02/2024

## 2024-07-02 NOTE — Discharge Instructions (Addendum)
 We will arrange f/u for ureteroscopy in the near future.

## 2024-07-02 NOTE — Op Note (Addendum)
 Preoperative diagnosis:  Left ureteral stone and UTI   Postoperative diagnosis:  Left ureteral stone and UTI   Procedure:  Cystoscopy Left ureteral stent placement  Application of fluoroscopy.   Surgeon: Norleen Seltzer, MD  Assistant: Maurilio Agar, MD   Anesthesia: General  Complications: None  Intraoperative findings: erythema of the posterior bladder wall c/w cystitis. Orthotopic ureteral orifices. Some purulent efflux noted after insertion of sensor wire into renal pelvis. Straightforward insertion of left JJ ureteral stent.   EBL: None  Specimens: None  Indication: Brandon [name] is a 78 y.o. patient with recent history of ESWL for a left-sided ureteral stone who presented with severe left flank pain and was found to have a 5 mm left distal stone fragment with obstruction and a UA that is concerning for infection. After reviewing the management options for treatment, he elected to proceed with the above surgical procedure(s). We have discussed the potential benefits and risks of the procedure, side effects of the proposed treatment, the likelihood of the patient achieving the goals of the procedure, and any potential problems that might occur during the procedure or recuperation. Informed consent has been obtained.  Description of procedure:  The patient was taken to the operating room and general anesthesia was induced.  The patient was placed in the dorsal lithotomy position, prepped and draped in the usual sterile fashion, and preoperative antibiotics were administered. A preoperative time-out was performed.   Cystourethroscopy was performed.  The patient's urethra was examined and was normal  demonstrated bilobar prostatic hypertrophy with a median lobe. The bladder was then systematically examined in its entirety. There was no evidence for any bladder tumors, stones, or other mucosal pathology beyond erythema of the posterior bladder wall c/w cystitis.    Attention then turned  to the leftureteral orifice and a ureteral catheter was used to intubate the ureteral orifice.    A 0.38 sensor guidewire was then advanced through the open-ended catheter and up the left ureter into the renal pelvis under fluoroscopic guidance.  The open-ended catheter was removed. A 6 Fr x 26 cm JJ ureteral stent was advance over the wire using Seldinger technique.  The stent was positioned appropriately under fluoroscopic and cystoscopic guidance.  The wire was then removed with an adequate stent curl noted in the renal pelvis as well as in the bladder.  The bladder was then emptied and the procedure ended.  The patient appeared to tolerate the procedure well and without complications.  The patient was able to be awakened and transferred to the recovery unit in satisfactory condition.

## 2024-07-02 NOTE — H&P (View-Only) (Signed)
 Subjective:    07/02/2024: He presents today with concerns of severe flank pain. The pain is worse on the right side but radiates to the left. He recently underwent ESWL for a left-sided ureteral stone. He did pass some pieces of stone material and had a recent KUB and renal ultrasound which showed multiple nonobstructive calcifications bilaterally but no hydronephrosis or ureteral calculi. He states over the last 2 weeks his pain has intensified and is severe. It is not controlled with Tylenol  at home. He does have tramadol  that does help with some of the pain. He endorses intense urgency of urination. Denies fevers and chills  He was found to have a 5 mm left distal stone fragment with obstruction and a UA that is concerning for infection  He is to have placement of a ureteral stent today.  ROS:  Review of Systems  Constitutional:  Negative for chills and fever.  Gastrointestinal:  Negative for nausea and vomiting.  Genitourinary:  Positive for flank pain and urgency.    Allergies  Allergen Reactions   Lopressor  [Metoprolol ] Other (See Comments)    Bradycardia at low doses (12.5mg  BID)   Neurontin [Gabapentin] Other (See Comments)    Hallucinations   Oxycontin  [Oxycodone ] Other (See Comments)    Confusion     Past Medical History:  Diagnosis Date   Anemia    Arthritis    Coronary atherosclerosis of native coronary artery    a. s/p CABG in 2015 with LIMA-LAD, SVG-PDA and seq SVG-RI-OM b. NST in 09/2021 showing very mild ischemia and a low-risk study   DDD (degenerative disc disease), lumbar    Essential hypertension    GERD (gastroesophageal reflux disease)    Hard of hearing both ears    Left ear is worst   Hemorrhoids    History of gout    History of kidney stones    history of myasthenia Gravis 2021   History of [facility] spotted fever    Left ureteral  & renal stone    Lumbago    Mixed hyperlipidemia    Personal history of COVID-19 08/10/2021   Paxlovid     Plantar fascial fibromatosis    Postoperative atrial fibrillation (HCC)    Skin Cancer (HCC)    Use of cane as ambulatory aid    Uses roller walker    UTI (urinary tract infection) 09/16/2021   Wears glasses     Past Surgical History:  Procedure Laterality Date   AMPUTATION Right 03/27/2014   Procedure: DEBRIDEMENT AND CLOSURE RIGHT INDEX FINGER;  Surgeon: Prentice LELON Pagan, MD;  Location: [facility] OR;  Service: Orthopedics;  Laterality: Right;   BACK SURGERY  12/2021   BICEPT TENODESIS  11/26/2011   Procedure: BICEPT TENODESIS;  Surgeon: Elspeth JONELLE Her, MD;  Location: [facility] OR;  Service: Orthopedics;  Laterality: Left;   BIOPSY  08/03/2018   Procedure: BIOPSY;  Surgeon: Golda Claudis PENNER, MD;  Location: [facility] END[facility] SUITE;  Service: Endoscopy;;  antral   Cataract surgery Bilateral    yrs ago per wife on 10-01-2021   CHOLECYSTECTOMY N/A 09/22/2018   Procedure: LAPAROSCOPIC CHOLECYSTECTOMY;  Surgeon: Kallie Manuelita BROCKS, MD;  Location: [facility] ORS;  Service: General;  Laterality: N/A;   COLONOSCOPY N/A 12/26/2014   Procedure: COLONOSCOPY;  Surgeon: Claudis PENNER Golda, MD;  Location: [facility];  Service: Endoscopy;  Laterality: N/A;  730   CORONARY ARTERY BYPASS GRAFT N/A 10/19/2013   Procedure: CORONARY ARTERY BYPASS GRAFTING (CABG);  Surgeon: Sudie VEAR Laine, MD;  Location: [facility] OR;  Service: Open Heart Surgery;  Laterality: N/A;  CABG times four using left internal mammary artery and left leg saphenous vein, incision made on right leg but no vein removed   CYSTOSCOPY W/ URETERAL STENT PLACEMENT Right 02/22/2022   Procedure: CYSTOSCOPY WITH RETROGRADE PYELOGRAM/URETERAL STENT PLACEMENT;  Surgeon: Devere Lonni Righter, MD;  Location: [facility] ORS;  Service: Urology;  Laterality: Right;   CYSTOSCOPY WITH RETROGRADE PYELOGRAM, URETEROSCOPY AND STENT PLACEMENT Left 08/24/2016   Procedure: CYSTOSCOPY WITH LEFT RETROGRADE PYELOGRAM, LEFT URETEROSCOPY, BASKET EXTRACTION LEFT URETERAL STONE;  Surgeon: Norleen Seltzer, MD;   Location: [facility] ORS;  Service: Urology;  Laterality: Left;   CYSTOSCOPY WITH RETROGRADE PYELOGRAM, URETEROSCOPY AND STENT PLACEMENT Bilateral 07/12/2023   Procedure: CYSTOSCOPY WITH BILATERAL RETROGRADE RIGHT URETEROSCOPY; REMOVAL OF FOREIGN BODIES COMPLEX WITH FULGURATION;  Surgeon: Seltzer Norleen, MD;  Location: [facility] ORS;  Service: Urology;  Laterality: Bilateral;  1 HR FOR CASE   CYSTOSCOPY/URETEROSCOPY/HOLMIUM LASER/STENT PLACEMENT Left 10/06/2021   Procedure: CYSTOSCOPY LEFT RETROGRADE PYELOGRAM URETEROSCOPY/HOLMIUM LASER/STENT PLACEMENT;  Surgeon: Seltzer Norleen, MD;  Location: [facility];  Service: Urology;  Laterality: Left;   CYSTOSCOPY/URETEROSCOPY/HOLMIUM LASER/STENT PLACEMENT Right 03/01/2022   Procedure: CYSTOSCOPY RIGHT URETEROSCOPY/HOLMIUM LASER/STENT PLACEMENT;  Surgeon: Seltzer Norleen, MD;  Location: [facility];  Service: Urology;  Laterality: Right;   CYSTOSCOPY/URETEROSCOPY/HOLMIUM LASER/STENT PLACEMENT  11/11/2023   Procedure: CYSTOSCOPY/BILATERAL RETROGRADE/RIGHT URETEROSCOPY AND STONE MANIPULATION;  Surgeon: Seltzer Norleen, MD;  Location: [facility] OR;  Service: Urology;;   ESOPHAGOGASTRODUODENOSCOPY N/A 08/03/2018   Procedure: ESOPHAGOGASTRODUODENOSCOPY (EGD);  Surgeon: Golda Claudis PENNER, MD;  [facility];  Service: Endoscopy;  Laterality: N/A;  2:55   EXTRACORPOREAL SHOCK WAVE LITHOTRIPSY Right 06/18/2019   Procedure: EXTRACORPOREAL SHOCK WAVE LITHOTRIPSY (ESWL);  Surgeon: Carolee Sherwood JONETTA DOUGLAS, MD;  Location: [facility];  Service: Urology;  Laterality: Right;   EXTRACORPOREAL SHOCK WAVE LITHOTRIPSY Left 05/25/2024   Procedure: LITHOTRIPSY, ESWL;  Surgeon: Alvaro Ricardo KATHEE Mickey., MD;  Location: [facility] ORS;  Service: Urology;  Laterality: Left;   INTRAOPERATIVE TRANSESOPHAGEAL ECHOCARDIOGRAM N/A 10/19/2013   Procedure: INTRAOPERATIVE TRANSESOPHAGEAL ECHOCARDIOGRAM;  Surgeon: Sudie VEAR Laine, MD;  Location: [facility] OR;  Service: Open Heart Surgery;  Laterality: N/A;   KYPHOPLASTY N/A 03/27/2021    Procedure: Lumbar One and Lumbar Two Kyphoplasty;  Surgeon: Unice Pac, MD;  Location: [facility] OR;  Service: Neurosurgery;  Laterality: N/A;   left hand carpal tunnel release  04/04/2015   LEFT HEART CATH AND CORS/GRAFTS ANGIOGRAPHY N/A 04/17/2024   Procedure: LEFT HEART CATH AND CORS/GRAFTS ANGIOGRAPHY;  Surgeon: Wonda Sharper, MD;  Location: [facility] INVASIVE CV LAB;  Service: Cardiovascular;  Laterality: N/A;   LEFT HEART CATHETERIZATION WITH CORONARY ANGIOGRAM N/A 10/09/2013   Procedure: LEFT HEART CATHETERIZATION WITH CORONARY ANGIOGRAM;  Surgeon: Lonni JONETTA Cash, MD;  Location: [facility] CATH LAB;  Service: Cardiovascular;  Laterality: N/A;   Left shoulder rotator cuff repair  12/04/2011   MOHS procedure  01/06/2021   dr samule lunger on left hand   ORIF PERIPROSTHETIC FRACTURE Left 03/19/2020   Procedure: OPEN REDUCTION INTERNAL FIXATION (ORIF) PERIPROSTHETIC FRACTURE LEFT FEMUR WITH FEMORAL REVISION;  Surgeon: Melodi Lerner, MD;  Location: [facility] ORS;  Service: Orthopedics;  Laterality: Left;   REVERSE SHOULDER ARTHROPLASTY Right 10/08/2022   Procedure: REVERSE SHOULDER ARTHROPLASTY;  Surgeon: Kay Kemps, MD;  Location: [facility] ORS;  Service: Orthopedics;  Laterality: Right;  general, choice with interscalene block   TOTAL HIP ARTHROPLASTY Left 01/28/2020   Procedure: TOTAL HIP ARTHROPLASTY ANTERIOR APPROACH;  Surgeon: Melodi Lerner, MD;  Location: [facility] ORS;  Service: Orthopedics;  Laterality: Left;    TOTAL KNEE ARTHROPLASTY Left 04/18/2001   TOTAL KNEE ARTHROPLASTY Right 05/02/2023   Procedure: TOTAL KNEE ARTHROPLASTY;  Surgeon: Melodi Lerner, MD;  Location: [facility] ORS;  Service: Orthopedics;  Laterality: Right;   traumaticvamputation of finger  2015   VASECTOMY     yrs ago per wife on 10-01-2021    Social History   Socioeconomic History   Marital status: Married    Spouse name: [name]   Number of children: 2   Years of education: Not on file   Highest education level: 10th grade   Occupational History   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 [employer]  Tobacco Use   Smoking status: Former    Current packs/day: 0.00    Average packs/day: 2.0 packs/day for 30.0 years (60.0 ttl pk-yrs)    Types: Cigarettes    Start date: 08/16/1958    Quit date: 08/17/1983    Years since quitting: 40.9   Smokeless tobacco: Former    Types: Chew    Quit date: 1985   Tobacco comments:    quit smoking 34yrs ago  Vaping Use   Vaping status: Never Used  Substance and Sexual Activity   Alcohol use: Not Currently    Comment: none in 4 years   Drug use: No   Sexual activity: Not on file  Other Topics Concern   Not on file  Social History Narrative   Lives with wife   Caffeine- coffee 3- 4 c daily   Social Drivers of Health   Financial Resource Strain: Low Risk  (04/21/2022)   Overall Financial Resource Strain (CARDIA)    Difficulty of Paying Living Expenses: Not hard at all  Food Insecurity: No Food Insecurity (05/02/2023)   Hunger Vital Sign    Worried About Running Out of Food in the Last Year: Never true    Ran Out of Food in the Last Year: Never true  Transportation Needs: No Transportation Needs (05/02/2023)   PRAPARE - Administrator, Civil Service (Medical): No    Lack of Transportation (Non-Medical): No  Physical Activity: Not on file  Stress: Not on file  Social Connections: Not on file  Intimate Partner Violence: Not At Risk (05/02/2023)   Humiliation, Afraid, Rape, and Kick questionnaire    Fear of Current or Ex-Partner: No    Emotionally Abused: No    Physically Abused: No    Sexually Abused: No    Family History  Problem Relation Age of Onset   Hypertension Sister    Lung cancer Father    Heart failure Mother     Anti-infectives: Anti-infectives (From admission, onward)    Start     Dose/Rate Route Frequency Ordered Stop   07/02/24 1323  ceFAZolin  (ANCEF ) 2-4 GM/100ML-% IVPB       Note to Pharmacy: Sonny Mays B: cabinet override      07/02/24 1323 07/03/24  0129   07/02/24 1322  ceFAZolin  (ANCEF ) IVPB 2g/100 mL premix        2 g 200 mL/hr over 30 Minutes Intravenous 30 min pre-op 07/02/24 1322         Current Facility-Administered Medications  Medication Dose Route Frequency Provider Last Rate Last Admin   ceFAZolin  (ANCEF ) 2-4 GM/100ML-% IVPB            ceFAZolin  (ANCEF ) IVPB 2g/100 mL premix  2 g Intravenous 30 min Pre-Op Watt Rush, MD       lactated ringers  infusion  Intravenous Continuous Jerrye Sharper, MD 10 mL/hr at 07/02/24 1329 New Bag at 07/02/24 1329     Objective: Vital signs in last 24 hours: BP (!) 148/85   Pulse (!) 51   Temp 98.3 F (36.8 C) (Oral)   Resp 17   Ht 6' 1 (1.854 m)   Wt 82.1 kg   SpO2 98%   BMI 23.88 kg/m   Intake/Output from previous day: No intake/output data recorded. Intake/Output this shift: No intake/output data recorded.   Physical Exam Vitals reviewed.  Constitutional:      Appearance: Normal appearance.  Cardiovascular:     Rate and Rhythm: Regular rhythm. Bradycardia present.  Pulmonary:     Effort: Pulmonary effort is normal. No respiratory distress.  Neurological:     Mental Status: He is alert.     Lab Results:  Results for orders placed or performed during the hospital encounter of 07/02/24 (from the past 24 hours)  CBC     Status: None   Collection Time: 07/02/24  1:33 PM  Result Value Ref Range   WBC 6.7 4.0 - 10.5 K/uL   RBC 4.74 4.22 - 5.81 MIL/uL   Hemoglobin 14.1 13.0 - 17.0 g/dL   HCT 56.5 60.9 - 47.9 %   MCV 91.6 80.0 - 100.0 fL   MCH 29.7 26.0 - 34.0 pg   MCHC 32.5 30.0 - 36.0 g/dL   RDW 86.4 88.4 - 84.4 %   Platelets 211 150 - 400 K/uL   nRBC 0.0 0.0 - 0.2 %  Basic metabolic panel     Status: None   Collection Time: 07/02/24  1:33 PM  Result Value Ref Range   Sodium 140 135 - 145 mmol/L   Potassium 3.9 3.5 - 5.1 mmol/L   Chloride 104 98 - 111 mmol/L   CO2 25 22 - 32 mmol/L   Glucose, Bld 92 70 - 99 mg/dL   BUN 14 8 - 23 mg/dL   Creatinine,  Ser 9.00 0.61 - 1.24 mg/dL   Calcium 9.4 8.9 - 89.6 mg/dL   GFR, Estimated >39 >39 mL/min   Anion gap 11 5 - 15    BMET Recent Labs    07/02/24 1333  NA 140  K 3.9  CL 104  CO2 25  GLUCOSE 92  BUN 14  CREATININE 0.99  CALCIUM 9.4   PT/INR No results for input(s): LABPROT, INR in the last 72 hours. ABG No results for input(s): PHART, HCO3 in the last 72 hours.  Invalid input(s): PCO2, PO2  Studies/Results: No results found.   Assessment/Plan: 5mm left distal stone with obstruction and possible UTI.  I will take him for cystoscopy with left ureteral stent insertion.  Risks reviewed in detail.    Meds ordered this encounter  Medications   lactated ringers  infusion   OR Linked Order Group    chlorhexidine  (PERIDEX ) 0.12 % solution 15 mL    Oral care mouth rinse   ceFAZolin  (ANCEF ) IVPB 2g/100 mL premix    Indication::   Surgical Prophylaxis   ceFAZolin  (ANCEF ) 2-4 GM/100ML-% IVPB    Sonny Mays B: cabinet override     Orders Placed This Encounter  Procedures   CBC    Standing Status:   Standing    Number of Occurrences:   1   Basic metabolic panel    Standing Status:   Standing    Number of Occurrences:   1   Diet NPO time specified    Standing Status:  Standing    Number of Occurrences:   1   Vital signs Per Protocol    Standing Status:   Standing    Number of Occurrences:   1   Notify physician (specify)    Standing Status:   Standing    Number of Occurrences:   [phone]   Diet Per Protocol    Standing Status:   Standing    Number of Occurrences:   1   Pre-admission testing diagnosis    Standing Status:   Standing    Number of Occurrences:   1    Diagnosis:   Encounter for other preprocedural examination [Z01.818]   Medication Instructions Per Protocol    Standing Status:   Standing    Number of Occurrences:   1   Discharge instructions    Give patient/families discharge instruction brochure or individual surgeons' instructions to  read PREOP.    Standing Status:   Standing    Number of Occurrences:   1   Void    On call to O.R.    Standing Status:   Standing    Number of Occurrences:   1   IV Fluids Per Protocol    Standing Status:   Standing    Number of Occurrences:   1   May use Lidocaine  1% - 2% subcutaneously prior to IV start if patient not allergic to Lidocaine     1% - 2% subcutaneously prior to IV start if patient not allergic to Lidocaine     Standing Status:   Standing    Number of Occurrences:   20   Follow Diabetes Medication Adjustment Guidelines Prior to Procedure and Surgery    Standing Status:   Standing    Number of Occurrences:   1   Anesthesia Preoperative Order    Standing Status:   Standing    Number of Occurrences:   1   Informed Consent Details: Physician/Practitioner Attestation; Transcribe to consent form and obtain patient signature    Standing Status:   Standing    Number of Occurrences:   1    Physician/Practitioner attestation of informed consent for procedure/surgical case:   I, the physician/practitioner, attest that I have discussed with the patient the benefits, risks, side effects, alternatives, likelihood of achieving goals and potential problems during recovery for the procedure that I have provided informed consent.    Procedure:   cystoscopy, left ureteral stent placement    Physician/Practitioner performing the procedure:   Dr Norleen Seltzer    Indication/Reason:   Left kidney stone and ureteral stone   Initiate Pre-op Protocol    Standing Status:   Standing    Number of Occurrences:   1   Pre-admission testing diagnosis    Standing Status:   Standing    Number of Occurrences:   1    Diagnosis:   Encounter for other preprocedural examination [Z01.818]   Labs per anesthesia    Standing Status:   Standing    Number of Occurrences:   1   Chest x-ray per Anesthesia    Standing Status:   Standing    Number of Occurrences:   1   EKG per anesthesia    Standing Status:    Standing    Number of Occurrences:   1   SCD's    Standing Status:   Standing    Number of Occurrences:   1    Laterality:   Bilateral   Continuous pulse oximetry    For  all sedated patients    Standing Status:   Standing    Number of Occurrences:   1   Oxygen therapy Mode or (Route): Nasal cannula; Keep O2 saturation between: >92%    2-4 Liters/min. Titrate as needed.  For all sedated patients    Standing Status:   Standing    Number of Occurrences:   20    Mode or (Route):   Nasal cannula    Keep O2 saturation between:   >92%   Insert and maintain IV line    Nurse to order flush per facility specific protocol.    Standing Status:   Standing    Number of Occurrences:   1     No follow-ups on file.        Brandon [name] 07/02/2024

## 2024-07-02 NOTE — Anesthesia Preprocedure Evaluation (Signed)
 Anesthesia Evaluation  Patient identified by MRN, date of birth, ID band Patient awake    Reviewed: Allergy & Precautions, NPO status , Patient's Chart, lab work & pertinent test results, reviewed documented beta blocker date and time   Airway Mallampati: II  TM Distance: >3 FB     Dental no notable dental hx. (+) Teeth Intact, Dental Advisory Given, Chipped, Caps,    Pulmonary former smoker   Pulmonary exam normal breath sounds clear to auscultation       Cardiovascular hypertension, Pt. on medications + CAD and + CABG  Normal cardiovascular exam Rhythm:Regular Rate:Normal   s/p CABG in 2015 with LIMA-LAD, SVG-PDA and seq SVG-RI-OM b. NST in 09/2021 showing very mild ischemia and a low-risk study   Neuro/Psych Hx/o ocular myasthenia gravis- on mestinon  last dose this am  Neuromuscular disease  negative psych ROS   GI/Hepatic Neg liver ROS,GERD  Medicated,,  Endo/Other  HLD  Renal/GU negative Renal ROS  negative genitourinary   Musculoskeletal  (+) Arthritis , Osteoarthritis,    Abdominal   Peds  Hematology  (+) Blood dyscrasia, anemia   Anesthesia Other Findings   Reproductive/Obstetrics                              Anesthesia Physical Anesthesia Plan  ASA: 3  Anesthesia Plan:    Post-op Pain Management: Minimal or no pain anticipated   Induction: Intravenous  PONV Risk Score and Plan: 3 and Treatment may vary due to age or medical condition, Ondansetron , Propofol  infusion and TIVA  Airway Management Planned: LMA  Additional Equipment: None  Intra-op Plan:   Post-operative Plan: Extubation in OR  Informed Consent: I have reviewed the patients History and Physical, chart, labs and discussed the procedure including the risks, benefits and alternatives for the proposed anesthesia with the patient or authorized representative who has indicated his/her understanding and  acceptance.     Dental advisory given  Plan Discussed with: CRNA and Anesthesiologist  Anesthesia Plan Comments:          Anesthesia Quick Evaluation

## 2024-07-02 NOTE — Anesthesia Postprocedure Evaluation (Signed)
 Anesthesia Post Note  Patient: [name]  Procedure(s) Performed: CYSTOSCOPY, FLEXIBLE, WITH STENT REPLACEMENT (Left)     Patient location during evaluation: PACU Anesthesia Type: General Level of consciousness: awake and alert and oriented Pain management: pain level controlled Vital Signs Assessment: post-procedure vital signs reviewed and stable Respiratory status: spontaneous breathing, nonlabored ventilation and respiratory function stable Cardiovascular status: blood pressure returned to baseline and stable Postop Assessment: no apparent nausea or vomiting Anesthetic complications: no   No notable events documented.  Last Vitals:  Vitals:   07/02/24 1900 07/02/24 1915  BP: (!) 157/72 (!) 160/85  Pulse: (!) 54 (!) 50  Resp: 17 13  Temp:    SpO2: 96% 96%    Last Pain:  Vitals:   07/02/24 1915  TempSrc:   PainSc: 0-No pain                 Lankford Gutzmer A.

## 2024-07-02 NOTE — Transfer of Care (Signed)
 Immediate Anesthesia Transfer of Care Note  [name]  Procedure(s) Performed: PHYLLIS SIDE, WITH STENT REPLACEMENT (Left)  Patient Location: PACU  Anesthesia Type:General  Level of Consciousness: drowsy  Airway & Oxygen Therapy: Patient Spontanous Breathing and Patient connected to face mask oxygen  Post-op Assessment: Report given to RN and Post -op Vital signs reviewed and stable  Post vital signs: Reviewed and stable  Last Vitals:  Vitals Value Taken Time  BP 129/77 07/02/24 18:45  Temp    Pulse 51 07/02/24 18:46  Resp 13 07/02/24 18:46  SpO2 97 % 07/02/24 18:46  Vitals shown include unfiled device data.  Last Pain:  Vitals:   07/02/24 1330  TempSrc: Oral  PainSc:          Complications: No notable events documented.

## 2024-07-02 NOTE — Anesthesia Procedure Notes (Signed)
 Procedure Name: LMA Insertion Date/Time: 07/02/2024 6:10 PM  Performed by: Cena Epps, CRNAPre-anesthesia Checklist: Patient identified, Emergency Drugs available, Suction available and Patient being monitored Patient Re-evaluated:Patient Re-evaluated prior to induction Oxygen Delivery Method: Circle System Utilized Preoxygenation: Pre-oxygenation with 100% oxygen Induction Type: IV induction Ventilation: Mask ventilation without difficulty LMA: LMA inserted LMA Size: 5.0 Number of attempts: 1 Airway Equipment and Method: Bite block Placement Confirmation: positive ETCO2 Tube secured with: Tape Dental Injury: Teeth and Oropharynx as per pre-operative assessment

## 2024-07-03 ENCOUNTER — Encounter (HOSPITAL_COMMUNITY): Payer: Self-pay | Admitting: Urology

## 2024-07-06 ENCOUNTER — Encounter (HOSPITAL_COMMUNITY): Payer: Self-pay | Admitting: Urology

## 2024-07-06 ENCOUNTER — Other Ambulatory Visit: Payer: Self-pay

## 2024-07-06 ENCOUNTER — Other Ambulatory Visit: Payer: Self-pay | Admitting: Urology

## 2024-07-06 NOTE — Progress Notes (Addendum)
 For Anesthesia: PCP - Norleen General, MD  Cardiologist - Debera Jayson MATSU, MD last office visit 05/16/24 in [facility] Pulmonologist-Penumalli, Eduard SAUNDERS, MD last office visit note 09/19/23 in [facility]  Bowel Prep reminder:N/A  Chest x-ray - 10/16/23 in [facility] EKG - 04/13/24 in [facility] Stress Test - 02/06/24 in [facility] ECHO - 10/14/23 in [facility] Cardiac Cath - 04/17/24 in [facility] Pacemaker/ICD device last checked: N/A Pacemaker orders received: N/A Device Rep notified: N/A  Spinal Cord Stimulator: N/A  Sleep Study - N/A CPAP - N/A  Fasting Blood Sugar - N/A Checks Blood Sugar ___N/A__ times a day Date and result of last Hgb A1c-N/A  Last dose of GLP1 agonist- N/A GLP1 instructions: Hold 7 days prior to schedule (Hold 24 hours-daily)   Last dose of SGLT-2 inhibitors- N/A SGLT-2 instructions: Hold 72 hours prior to surgery  Blood Thinner Instructions:N/A Last Dose:N/A Time last taken:N/A  Aspirin  Instructions: Yes Last Dose: 07/06/24 Time last taken: 830 AM  Activity level: Can go up a flight of stairs and activities of daily living without stopping and without chest pain and/or shortness of breath    Anesthesia review: Severe multivessel CAD, History of CABG, Myasthenia gravis, Atrial fib post op, HTN  Patient denies shortness of breath, fever, cough and chest pain at PAT appointment   Patient verbalized understanding of instructions that were reviewed over the telephone.

## 2024-07-07 NOTE — Progress Notes (Signed)
 Case: [id number] Date/Time: 07/09/24 1130   Procedure: CYSTOSCOPY/URETEROSCOPY/HOLMIUM LASER/STENT PLACEMENT (Left)   Anesthesia type: General   Diagnosis: Calculus of ureter [N20.1]   Pre-op diagnosis: LEFT URETERAL STONE   Location: [facility]   Surgeons: Watt Rush, MD       DISCUSSION: Brandon [name] is a 78 yo male with PMH of former smoking, HTN, CAD s/p CABG x 3(2015), GERD, myasthenia gravis, DDD of lumbar spine, arthritis, kidney stones  Case was late afternoon add-on on 11/21  Patient follows with Cardiology for CAD s/p CABG x 3. Patient was having issues with weakness, diaphoresis, palpitations, syncope in February 2025. He had updated Echo in 09/2023 which showed EF 65-70%, grade I DD, severe LA dilation, mild aortic stenosis with mean gradient .  Patient had ongoing symptoms and stress testing was done on 02/06/24 which showed prior infarction without ischemia. He underwent repeat cardiac cath in 04/2024 due to ongoing symptoms which showed patent grafts. He was last seen by Dr Debera on 05/16/24. 14 day Zio patch was ordered which is currently in process.  A cardiac clearance was obtained for lithotripsy under local on 07/02/24.   Per Dr.McDowell 05/23/2024: Lithotripsy under local anesthesia is a low risk endeavor and should not require any additional cardiac risk stratification. He had a recent cardiac catheterization demonstrating patent bypass grafts. He is undergoing workup for recurring spells with plan to wear Zio patch, if he has this patch on while undergoing lithotripsy it may cause interference in our readings, but other than that he should be able to proceed. Okay to hold aspirin  temporarily as requested.   Patient follows with Neurology for myasthenia gravis. Last seen on 09/19/23. Per Dr. Margaret: Since last visit, doing well. Symptoms are stable. No alleviating or aggravating factors. Tolerating pyridostigmine  30mg  twice a day.  Advised f/u  in 1 year.    PROVIDERS: Marvine Rush, MD Cardiologist - Jayson Debera, MD  LABS: Obtain DOS   IMAGES:   EKG:   Cardiac catheterization 04/17/2024:    Prox RCA lesion is 95% stenosed.   Dist RCA lesion is 90% stenosed.   1st Mrg lesion is 95% stenosed.   Ost Cx to Mid Cx lesion is 50% stenosed.   Mid LAD lesion is 100% stenosed.   Severe multivessel CAD with total occlusion of the LAD, severe stenoses of the RCA, and severe diffuse stenosis of the LCx S/P CABG with patency of the LIMA-LAD, SVG-PDA, and sequential PDA to the OM1 and OM2 branches of the circumflex Normal LVEDP   Recommend: continue medical therapy.  Stress test 02/06/24:  Narrative & Impression     Findings are consistent with small basal inferior infarction. There is no current ischemia. The study is low risk.   No ST deviation was noted.   LV perfusion is abnormal. Small mild intensity fixed inferobasal defect consistent with small prior infarct.   Left ventricular function is low normal, 51%. End diastolic cavity size is normal.    Echo 10/14/2019:  IMPRESSIONS     1. Left ventricular ejection fraction, by estimation, is 65 to 70%. The  left ventricle has normal function. The left ventricle has no regional  wall motion abnormalities. There is moderate asymmetric left ventricular  hypertrophy of the basal-septal  segment. Left ventricular diastolic parameters are consistent with Grade I  diastolic dysfunction (impaired relaxation).   2. Right ventricular systolic function is normal. The right ventricular  size is normal. Tricuspid regurgitation signal is inadequate for  assessing  PA pressure.   3. Left atrial size was severely dilated.   4. The mitral valve is normal in structure. No evidence of mitral valve  regurgitation. No evidence of mitral stenosis.   5. The aortic valve is tricuspid. There is mild calcification of the  aortic valve. There is mild thickening of the aortic valve. Aortic  valve  regurgitation is not visualized. Mild aortic valve stenosis. Aortic valve  area, by VTI measures 1.69 cm.  Aortic valve mean gradient measures 10.0 mmHg. Aortic valve Vmax measures  2.16 m/s.   6. The inferior vena cava is normal in size with greater than 50%  respiratory variability, suggesting right atrial pressure of 3 mmHg.   Past Medical History:  Diagnosis Date   Anemia    Arthritis    Coronary atherosclerosis of native coronary artery    a. s/p CABG in 2015 with LIMA-LAD, SVG-PDA and seq SVG-RI-OM b. NST in 09/2021 showing very mild ischemia and a low-risk study   DDD (degenerative disc disease), lumbar    Essential hypertension    GERD (gastroesophageal reflux disease)    Hard of hearing both ears    Left ear is worst   Hemorrhoids    History of gout    History of kidney stones    history of myasthenia Gravis 2021   History of [facility] spotted fever    Left ureteral  & renal stone    Lumbago    Mixed hyperlipidemia    Personal history of COVID-19 08/10/2021   Paxlovid    Plantar fascial fibromatosis    Postoperative atrial fibrillation (HCC)    Skin Cancer (HCC)    Use of cane as ambulatory aid    Uses roller walker    UTI (urinary tract infection) 09/16/2021   Wears glasses     Past Surgical History:  Procedure Laterality Date   AMPUTATION Right 03/27/2014   Procedure: DEBRIDEMENT AND CLOSURE RIGHT INDEX FINGER;  Surgeon: Prentice LELON Pagan, MD;  Location: [facility];  Service: Orthopedics;  Laterality: Right;   BACK SURGERY  12/2021   BICEPT TENODESIS  11/26/2011   Procedure: BICEPT TENODESIS;  Surgeon: Elspeth JONELLE Her, MD;  Location: [facility] OR;  Service: Orthopedics;  Laterality: Left;   BIOPSY  08/03/2018   Procedure: BIOPSY;  Surgeon: Golda Claudis PENNER, MD;  [facility];  Service: Endoscopy;;  antral   Cataract surgery Bilateral    yrs ago per wife on 10-01-2021   CHOLECYSTECTOMY N/A 09/22/2018   Procedure: LAPAROSCOPIC CHOLECYSTECTOMY;   Surgeon: Kallie Manuelita BROCKS, MD;  Location: [facility];  Service: General;  Laterality: N/A;   COLONOSCOPY N/A 12/26/2014   Procedure: COLONOSCOPY;  Surgeon: Claudis PENNER Golda, MD;  [facility];  Service: Endoscopy;  Laterality: N/A;  730   CORONARY ARTERY BYPASS GRAFT N/A 10/19/2013   Procedure: CORONARY ARTERY BYPASS GRAFTING (CABG);  Surgeon: Sudie VEAR Laine, MD;  Location: [facility] OR;  Service: Open Heart Surgery;  Laterality: N/A;  CABG times four using left internal mammary artery and left leg saphenous vein, incision made on right leg but no vein removed   CYSTOSCOPY W/ URETERAL STENT PLACEMENT Right 02/22/2022   Procedure: CYSTOSCOPY WITH RETROGRADE PYELOGRAM/URETERAL STENT PLACEMENT;  Surgeon: Devere Lonni Righter, MD;  [facility];  Service: Urology;  Laterality: Right;   CYSTOSCOPY W/ URETERAL STENT PLACEMENT Left 07/02/2024   Procedure: CYSTOSCOPY, FLEXIBLE, WITH STENT REPLACEMENT;  Surgeon: Watt Rush, MD;  [facility];  Service: Urology;  Laterality: Left;   CYSTOSCOPY WITH RETROGRADE PYELOGRAM, URETEROSCOPY AND STENT PLACEMENT Left 08/24/2016   Procedure: CYSTOSCOPY WITH LEFT RETROGRADE PYELOGRAM, LEFT URETEROSCOPY, BASKET EXTRACTION LEFT URETERAL STONE;  Surgeon: Norleen Seltzer, MD;  [facility];  Service: Urology;  Laterality: Left;   CYSTOSCOPY WITH RETROGRADE PYELOGRAM, URETEROSCOPY AND STENT PLACEMENT Bilateral 07/12/2023   Procedure: CYSTOSCOPY WITH BILATERAL RETROGRADE RIGHT URETEROSCOPY; REMOVAL OF FOREIGN BODIES COMPLEX WITH FULGURATION;  Surgeon: Seltzer Norleen, MD;  [facility];  Service: Urology;  Laterality: Bilateral;  1 HR FOR CASE   CYSTOSCOPY/URETEROSCOPY/HOLMIUM LASER/STENT PLACEMENT Left 10/06/2021   Procedure: CYSTOSCOPY LEFT RETROGRADE PYELOGRAM URETEROSCOPY/HOLMIUM LASER/STENT PLACEMENT;  Surgeon: Seltzer Norleen, MD;  Location[facility];  Service: Urology;  Laterality: Left;   CYSTOSCOPY/URETEROSCOPY/HOLMIUM LASER/STENT PLACEMENT  Right 03/01/2022   Procedure: CYSTOSCOPY RIGHT URETEROSCOPY/HOLMIUM LASER/STENT PLACEMENT;  Surgeon: Seltzer Norleen, MD;  [facility];  Service: Urology;  Laterality: Right;   CYSTOSCOPY/URETEROSCOPY/HOLMIUM LASER/STENT PLACEMENT  11/11/2023   Procedure: CYSTOSCOPY/BILATERAL RETROGRADE/RIGHT URETEROSCOPY AND STONE MANIPULATION;  Surgeon: Seltzer Norleen, MD;  Location: [facility] OR;  Service: Urology;;   ESOPHAGOGASTRODUODENOSCOPY N/A 08/03/2018   Procedure: ESOPHAGOGASTRODUODENOSCOPY (EGD);  Surgeon: Golda Claudis PENNER, MD;  [facility];  Service: Endoscopy;  Laterality: N/A;  2:55   EXTRACORPOREAL SHOCK WAVE LITHOTRIPSY Right 06/18/2019   Procedure: EXTRACORPOREAL SHOCK WAVE LITHOTRIPSY (ESWL);  Surgeon: Carolee Sherwood JONETTA DOUGLAS, MD;  [facility];  Service: Urology;  Laterality: Right;   EXTRACORPOREAL SHOCK WAVE LITHOTRIPSY Left 05/25/2024   Procedure: LITHOTRIPSY, ESWL;  Surgeon: Alvaro Ricardo KATHEE Mickey., MD;  [facility];  Service: Urology;  Laterality: Left;   INTRAOPERATIVE TRANSESOPHAGEAL ECHOCARDIOGRAM N/A 10/19/2013   Procedure: INTRAOPERATIVE TRANSESOPHAGEAL ECHOCARDIOGRAM;  Surgeon: Sudie VEAR Laine, MD;  Location: [facility] OR;  Service: Open Heart Surgery;  Laterality: N/A;   KYPHOPLASTY N/A 03/27/2021   Procedure: Lumbar One and Lumbar Two Kyphoplasty;  Surgeon: Unice Pac, MD;  Location: [facility] OR;  Service: Neurosurgery;  Laterality: N/A;   left hand carpal tunnel release  04/04/2015   LEFT HEART CATH AND CORS/GRAFTS ANGIOGRAPHY N/A 04/17/2024   Procedure: LEFT HEART CATH AND CORS/GRAFTS ANGIOGRAPHY;  Surgeon: Wonda Sharper, MD;  Location: [facility] INVASIVE CV LAB;  Service: Cardiovascular;  Laterality: N/A;   LEFT HEART CATHETERIZATION WITH CORONARY ANGIOGRAM N/A 10/09/2013   Procedure: LEFT HEART CATHETERIZATION WITH CORONARY ANGIOGRAM;  Surgeon: Lonni JONETTA Cash, MD;  Location: [facility] CATH LAB;  Service: Cardiovascular;  Laterality: N/A;   Left shoulder rotator cuff repair  12/04/2011    MOHS procedure  01/06/2021   dr samule lunger on left hand   ORIF PERIPROSTHETIC FRACTURE Left 03/19/2020   Procedure: OPEN REDUCTION INTERNAL FIXATION (ORIF) PERIPROSTHETIC FRACTURE LEFT FEMUR WITH FEMORAL REVISION;  Surgeon: Melodi Lerner, MD;  Location[facility];  Service: Orthopedics;  Laterality: Left;   REVERSE SHOULDER ARTHROPLASTY Right 10/08/2022   Procedure: REVERSE SHOULDER ARTHROPLASTY;  Surgeon: Kay Kemps, MD;  Location[facility];  Service: Orthopedics;  Laterality: Right;  general, choice with interscalene block   TOTAL HIP ARTHROPLASTY Left 01/28/2020   Procedure: TOTAL HIP ARTHROPLASTY ANTERIOR APPROACH;  Surgeon: Melodi Lerner, MD;  Location[facility];  Service: Orthopedics;  Laterality: Left;    TOTAL KNEE ARTHROPLASTY Left 04/18/2001   TOTAL KNEE ARTHROPLASTY Right 05/02/2023   Procedure: TOTAL KNEE ARTHROPLASTY;  Surgeon: Melodi Lerner, MD;  Location: [facility];  Service: Orthopedics;  Laterality: Right;   traumaticvamputation of finger  2015   VASECTOMY     yrs ago per wife on 10-01-2021  MEDICATIONS: No current facility-administered medications for this encounter.    acetaminophen  (TYLENOL ) 500 MG tablet   amLODipine  (NORVASC ) 5 MG tablet   amoxicillin-clavulanate (AUGMENTIN) 500-125 MG tablet   Ascorbic Acid  (VITAMIN C  PO)   aspirin  EC 81 MG tablet   Calcium Carb-Cholecalciferol (CALCIUM + VITAMIN D3 PO)   Diclofenac Sodium (VOLTAREN EX)   finasteride (PROSCAR) 5 MG tablet   Misc Natural Products (OSTEO BI-FLEX ADV TRIPLE ST) TABS   Multiple Vitamins-Minerals (MULTIVITAMIN MEN 50+) TABS   Omega-3 Fatty Acids (FISH OIL) 1000 MG CPDR   pantoprazole  (PROTONIX ) 40 MG tablet   pravastatin  (PRAVACHOL ) 40 MG tablet   pyridostigmine  (MESTINON ) 60 MG tablet   tamsulosin  (FLOMAX ) 0.4 MG CAPS capsule   [Paused] traMADol  (ULTRAM ) 50 MG tablet   HYDROcodone -acetaminophen  (NORCO/VICODIN) 5-325 MG tablet   nitroGLYCERIN  (NITROSTAT ) 0.4 MG SL tablet   ondansetron   (ZOFRAN -ODT) 8 MG disintegrating tablet   Burnard CHRISTELLA Senna, PA-C [facility] Short Stay/Anesthesiology [facility] Phone [phone] 07/07/2024 11:26 AM

## 2024-07-07 NOTE — Anesthesia Preprocedure Evaluation (Signed)
 Anesthesia Evaluation  Patient identified by MRN, date of birth, ID band Patient awake    Reviewed: Allergy & Precautions, NPO status , Patient's Chart, lab work & pertinent test results  History of Anesthesia Complications Negative for: history of anesthetic complications  Airway Mallampati: II  TM Distance: >3 FB Neck ROM: Full    Dental  (+) Teeth Intact, Dental Advisory Given   Pulmonary former smoker   breath sounds clear to auscultation       Cardiovascular hypertension, Pt. on medications + CAD and + CABG (2015)  + dysrhythmias Atrial Fibrillation + Valvular Problems/Murmurs (Mild AS via TTE (09/2023)) AS  Rhythm:Regular Rate:Normal  TTE (09/2023):  1. Left ventricular ejection fraction, by estimation, is 65 to 70%. The  left ventricle has normal function. The left ventricle has no regional  wall motion abnormalities. There is moderate asymmetric left ventricular  hypertrophy of the basal-septal  segment. Left ventricular diastolic parameters are consistent with Grade I  diastolic dysfunction (impaired relaxation).   2. Right ventricular systolic function is normal. The right ventricular  size is normal. Tricuspid regurgitation signal is inadequate for assessing  PA pressure.   3. Left atrial size was severely dilated.   4. The mitral valve is normal in structure. No evidence of mitral valve  regurgitation. No evidence of mitral stenosis.   5. The aortic valve is tricuspid. There is mild calcification of the  aortic valve. There is mild thickening of the aortic valve. Aortic valve  regurgitation is not visualized. Mild aortic valve stenosis. Aortic valve  area, by VTI measures 1.69 cm.  Aortic valve mean gradient measures 10.0 mmHg. Aortic valve Vmax measures  2.16 m/s.   6. The inferior vena cava is normal in size with greater than 50%  respiratory variability, suggesting right atrial pressure of 3 mmHg.    EKG  (03/2024): Sinus bradycardia with PVCs  LHC (04/2024):  1. Severe multivessel CAD with total occlusion of the LAD, severe stenoses of the RCA, and severe diffuse stenosis of the LCx 2. S/P CABG with patency of the LIMA-LAD, SVG-PDA, and sequential PDA to the OM1 and OM2 branches of the circumflex 3. Normal LVEDP    Neuro/Psych  Neuromuscular disease (Hx of Myasthenia Gravis)    GI/Hepatic ,GERD  Medicated and Controlled,,  Endo/Other    Renal/GU      Musculoskeletal   Abdominal   Peds  Hematology  (+) Blood dyscrasia, anemia   Anesthesia Other Findings   Reproductive/Obstetrics                              Anesthesia Physical Anesthesia Plan  ASA: 3  Anesthesia Plan: General   Post-op Pain Management:    Induction: Intravenous  PONV Risk Score and Plan: 2 and Treatment may vary due to age or medical condition  Airway Management Planned: LMA  Additional Equipment:   Intra-op Plan:   Post-operative Plan:   Informed Consent:      Dental advisory given  Plan Discussed with: CRNA  Anesthesia Plan Comments: (See PAT note from 11/22)         Anesthesia Quick Evaluation

## 2024-07-08 ENCOUNTER — Encounter (HOSPITAL_COMMUNITY): Payer: Self-pay | Admitting: Urology

## 2024-07-09 ENCOUNTER — Ambulatory Visit (HOSPITAL_COMMUNITY)
Admission: RE | Admit: 2024-07-09 | Discharge: 2024-07-09 | Disposition: A | Discharge status: Home / Self Care | Attending: Urology | Admitting: Urology

## 2024-07-09 ENCOUNTER — Ambulatory Visit (HOSPITAL_COMMUNITY)

## 2024-07-09 ENCOUNTER — Ambulatory Visit (HOSPITAL_COMMUNITY): Payer: Self-pay | Admitting: Physician Assistant

## 2024-07-09 ENCOUNTER — Encounter (HOSPITAL_COMMUNITY): Payer: Self-pay | Admitting: Urology

## 2024-07-09 ENCOUNTER — Encounter (HOSPITAL_COMMUNITY)
Admission: RE | Disposition: A | Discharge status: Home / Self Care | Payer: Self-pay | Source: Home / Self Care | Attending: Urology

## 2024-07-09 DIAGNOSIS — Z951 Presence of aortocoronary bypass graft: Secondary | ICD-10-CM | POA: Insufficient documentation

## 2024-07-09 DIAGNOSIS — Z8249 Family history of ischemic heart disease and other diseases of the circulatory system: Secondary | ICD-10-CM | POA: Insufficient documentation

## 2024-07-09 DIAGNOSIS — I4891 Unspecified atrial fibrillation: Secondary | ICD-10-CM | POA: Insufficient documentation

## 2024-07-09 DIAGNOSIS — I082 Rheumatic disorders of both aortic and tricuspid valves: Secondary | ICD-10-CM | POA: Insufficient documentation

## 2024-07-09 DIAGNOSIS — N202 Calculus of kidney with calculus of ureter: Secondary | ICD-10-CM | POA: Diagnosis present

## 2024-07-09 DIAGNOSIS — M1990 Unspecified osteoarthritis, unspecified site: Secondary | ICD-10-CM | POA: Diagnosis not present

## 2024-07-09 DIAGNOSIS — Z87891 Personal history of nicotine dependence: Secondary | ICD-10-CM | POA: Diagnosis not present

## 2024-07-09 DIAGNOSIS — K219 Gastro-esophageal reflux disease without esophagitis: Secondary | ICD-10-CM | POA: Diagnosis not present

## 2024-07-09 DIAGNOSIS — I2510 Atherosclerotic heart disease of native coronary artery without angina pectoris: Secondary | ICD-10-CM | POA: Diagnosis not present

## 2024-07-09 DIAGNOSIS — I10 Essential (primary) hypertension: Secondary | ICD-10-CM | POA: Insufficient documentation

## 2024-07-09 DIAGNOSIS — G7000 Myasthenia gravis without (acute) exacerbation: Secondary | ICD-10-CM | POA: Insufficient documentation

## 2024-07-09 DIAGNOSIS — R195 Other fecal abnormalities: Secondary | ICD-10-CM

## 2024-07-09 DIAGNOSIS — M51369 Other intervertebral disc degeneration, lumbar region without mention of lumbar back pain or lower extremity pain: Secondary | ICD-10-CM | POA: Insufficient documentation

## 2024-07-09 DIAGNOSIS — Z87440 Personal history of urinary (tract) infections: Secondary | ICD-10-CM | POA: Diagnosis not present

## 2024-07-09 HISTORY — PX: CYSTOSCOPY/URETEROSCOPY/HOLMIUM LASER/STENT PLACEMENT: SHX6546

## 2024-07-09 LAB — CBC
HCT: 45.7 % (ref 39.0–52.0)
Hemoglobin: 14.8 g/dL (ref 13.0–17.0)
MCH: 29.8 pg (ref 26.0–34.0)
MCHC: 32.4 g/dL (ref 30.0–36.0)
MCV: 92.1 fL (ref 80.0–100.0)
Platelets: 251 K/uL (ref 150–400)
RBC: 4.96 MIL/uL (ref 4.22–5.81)
RDW: 13.4 % (ref 11.5–15.5)
WBC: 7.3 K/uL (ref 4.0–10.5)
nRBC: 0 % (ref 0.0–0.2)

## 2024-07-09 LAB — BASIC METABOLIC PANEL WITH GFR
Anion gap: 9 (ref 5–15)
BUN: 15 mg/dL (ref 8–23)
CO2: 28 mmol/L (ref 22–32)
Calcium: 9.8 mg/dL (ref 8.9–10.3)
Chloride: 103 mmol/L (ref 98–111)
Creatinine, Ser: 1.06 mg/dL (ref 0.61–1.24)
GFR, Estimated: >60 mL/min (ref >60)
Glucose, Bld: 99 mg/dL (ref 70–99)
Potassium: 4.1 mmol/L (ref 3.5–5.1)
Sodium: 141 mmol/L (ref 135–145)

## 2024-07-09 SURGERY — CYSTOSCOPY/URETEROSCOPY/HOLMIUM LASER/STENT PLACEMENT
Anesthesia: General | Laterality: Left

## 2024-07-09 MED ORDER — SODIUM CHLORIDE 0.9% FLUSH: 3.0000 mL | Freq: Two times a day (BID) | INTRAVENOUS | Status: DC

## 2024-07-09 MED ORDER — PROPOFOL 10 MG/ML IV BOLUS
INTRAVENOUS | Status: DC | PRN
  Administered 2024-07-09: 180 mg via INTRAVENOUS

## 2024-07-09 MED ORDER — ORAL CARE MOUTH RINSE: 15.0000 mL | Freq: Once | OROMUCOSAL | Status: AC

## 2024-07-09 MED ORDER — SODIUM CHLORIDE 0.9 % IR SOLN
Status: DC | PRN
  Administered 2024-07-09: 3000 mL via INTRAVESICAL

## 2024-07-09 MED ORDER — FENTANYL CITRATE (PF) 100 MCG/2ML IJ SOLN
INTRAMUSCULAR | Status: AC
  Filled 2024-07-09: qty 2

## 2024-07-09 MED ORDER — LACTATED RINGERS IV SOLN: INTRAVENOUS | Status: DC

## 2024-07-09 MED ORDER — CHLORHEXIDINE GLUCONATE 0.12 % MT SOLN
15.0000 mL | Freq: Once | OROMUCOSAL | Status: AC
  Administered 2024-07-09: 15 mL via OROMUCOSAL

## 2024-07-09 MED ORDER — EPHEDRINE SULFATE (PRESSORS) 25 MG/5ML IV SOSY
PREFILLED_SYRINGE | INTRAVENOUS | Status: DC | PRN
  Administered 2024-07-09 (×4): 5 mg via INTRAVENOUS

## 2024-07-09 MED ORDER — ONDANSETRON HCL 4 MG/2ML IJ SOLN
INTRAMUSCULAR | Status: DC | PRN
  Administered 2024-07-09: 4 mg via INTRAVENOUS

## 2024-07-09 MED ORDER — HYDROCODONE-ACETAMINOPHEN 5-325 MG PO TABS
1.0000 | ORAL_TABLET | ORAL | Status: DC | PRN
Start: 2024-07-09 — End: 2024-07-09

## 2024-07-09 MED ORDER — CEFAZOLIN SODIUM-DEXTROSE 2-4 GM/100ML-% IV SOLN
2.0000 g | INTRAVENOUS | Status: AC
  Administered 2024-07-09: 2 g via INTRAVENOUS
  Filled 2024-07-09: qty 100

## 2024-07-09 MED ORDER — FENTANYL CITRATE (PF) 100 MCG/2ML IJ SOLN
INTRAMUSCULAR | Status: DC | PRN
  Administered 2024-07-09 (×4): 25 ug via INTRAVENOUS

## 2024-07-09 SURGICAL SUPPLY — 18 items
BAG URO CATCHER STRL LF (MISCELLANEOUS) ×1 IMPLANT
BASKET STONE NCOMPASS (UROLOGICAL SUPPLIES) IMPLANT
CATH URETERAL DUAL LUMEN 10F (MISCELLANEOUS) IMPLANT
CATH URETL OPEN 5X70 (CATHETERS) IMPLANT
CLOTH BEACON ORANGE TIMEOUT ST (SAFETY) ×1 IMPLANT
EXTRACTOR STONE NITINOL NGAGE (UROLOGICAL SUPPLIES) IMPLANT
GLOVE SURG SS PI 8.0 STRL IVOR (GLOVE) ×1 IMPLANT
GOWN STRL SURGICAL XL XLNG (GOWN DISPOSABLE) ×1 IMPLANT
GUIDEWIRE STR DUAL SENSOR (WIRE) ×1 IMPLANT
KIT TURNOVER KIT A (KITS) ×1 IMPLANT
LASER FIB FLEXIVA PULSE ID 365 (Laser) IMPLANT
MANIFOLD NEPTUNE II (INSTRUMENTS) ×1 IMPLANT
PACK CYSTO (CUSTOM PROCEDURE TRAY) ×1 IMPLANT
SHEATH NAVIGATOR HD 11/13X36 (SHEATH) IMPLANT
STENT URET 6FRX26 CONTOUR (STENTS) IMPLANT
TRACTIP FLEXIVA PULS ID 200XHI (Laser) IMPLANT
TUBING CONNECTING 10 (TUBING) ×1 IMPLANT
TUBING UROLOGY SET (TUBING) ×1 IMPLANT

## 2024-07-09 NOTE — Transfer of Care (Signed)
 Immediate Anesthesia Transfer of Care Note  [name]  Procedure(s) Performed: Procedure(s): CYSTOSCOPY/URETEROSCOPY/HOLMIUM LASER/STENT EXCHANGE (Left)  Patient Location: PACU  Anesthesia Type:General  Level of Consciousness:  sedated, patient cooperative and responds to stimulation  Airway & Oxygen Therapy:Patient Spontanous Breathing and Patient connected to face mask oxgen  Post-op Assessment:  Report given to PACU RN and Post -op Vital signs reviewed and stable  Post vital signs:  Reviewed and stable  Last Vitals:  Vitals:   07/09/24 1044  BP: (!) 141/82  Pulse: (!) 50  Resp: 16  Temp: 36.5 C  SpO2: 99%    Complications: No apparent anesthesia complications

## 2024-07-09 NOTE — Anesthesia Postprocedure Evaluation (Signed)
 Anesthesia Post Note  [name]  Procedure(s) Performed: CYSTOSCOPY/URETEROSCOPY/HOLMIUM LASER/STENT EXCHANGE (Left)     Patient location during evaluation: PACU Anesthesia Type: General Level of consciousness: awake Pain management: pain level controlled Vital Signs Assessment: post-procedure vital signs reviewed and stable Respiratory status: spontaneous breathing Cardiovascular status: blood pressure returned to baseline Postop Assessment: no apparent nausea or vomiting Anesthetic complications: no   No notable events documented.  Last Vitals:  Vitals:   07/09/24 1300 07/09/24 1315  BP: 137/76 (!) 163/86  Pulse: (!) 50 (!) 46  Resp: 12   Temp:  (!) 36.3 C  SpO2: 98% 100%    Last Pain:  Vitals:   07/09/24 1315  TempSrc:   PainSc: 0-No pain                 Lauraine KATHEE [place]

## 2024-07-09 NOTE — Anesthesia Procedure Notes (Signed)
 Procedure Name: LMA Insertion Date/Time: 07/09/2024 11:57 AM  Performed by: Vincenzo Show, CRNAPre-anesthesia Checklist: Patient identified, Emergency Drugs available, Suction available, Patient being monitored and Timeout performed Patient Re-evaluated:Patient Re-evaluated prior to induction Oxygen Delivery Method: Circle system utilized Preoxygenation: Pre-oxygenation with 100% oxygen Induction Type: IV induction Ventilation: Mask ventilation without difficulty LMA: LMA inserted LMA Size: 4.0 Tube size: 4.0 mm Number of attempts: 1 Placement Confirmation: positive ETCO2 and breath sounds checked- equal and bilateral Tube secured with: Tape Dental Injury: Teeth and Oropharynx as per pre-operative assessment

## 2024-07-09 NOTE — Interval H&P Note (Signed)
 History and Physical Interval Note:  He remains on antibiotics for his positive culture.   07/09/2024 11:18 AM  Brandon [name]  has presented today for surgery, with the diagnosis of LEFT URETERAL STONE.  The various methods of treatment have been discussed with the patient and family. After consideration of risks, benefits and other options for treatment, the patient has consented to  Procedure(s): CYSTOSCOPY/URETEROSCOPY/HOLMIUM LASER/STENT PLACEMENT (Left) as a surgical intervention.  The patient's history has been reviewed, patient examined, no change in status, stable for surgery.  I have reviewed the patient's chart and labs.  Questions were answered to the patient's satisfaction.     [name]

## 2024-07-09 NOTE — Discharge Instructions (Addendum)
 You may remove the stent by pulling the attached string.  If you don't feel you can do that, we will get it out at your follow up on 12/1.

## 2024-07-09 NOTE — Op Note (Signed)
 Procedure: 1.  Cystoscopy with removal and replacement of left double-J stent. 2.  Ureteroscopy with extraction of left distal ureteral and right renal stones. 3.  Application of fluoroscopy.  Pre-op diagnosis: Left distal ureteral stone and renal stones.  Postop diagnosis: Same.  Surgeon: Dr. Norleen Seltzer.  Anesthesia: General.  Specimen: Stones.  Drains: 6 French by 26 cm left Contour double-J stent with tether.  EBL: None.  Complications: None.  Indications: The patient is a 78 year old male with a history recurrent urolithiasis who recently underwent left ureteral stenting for a 5 mm left distal ureteral stone with mild obstruction and a urinary tract infection.  He has subsequently undergone treatment of a urinary tract infection and returns today for definitive stone management.  Procedure: He had been on antibiotics preoperatively and was given Ancef  in the operating room.  He was taken operating room where general anesthetic was induced.  He was placed in lithotomy position and fitted with PAS hose.  He was prepped with Betadine  solution and draped in usual sterile fashion.  Cystoscopy was performed using a 21 French scope and 30 degree lens.  Examination revealed a normal urethra.  The external sphincter was intact the prostatic urethra had bilobar hyperplasia with coaptation obstruction.  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  The stent loop was grasped and pulled to the urethral meatus where a wire was passed to the kidney under fluoroscopic guidance and the stent was removed.  A 6.5 French semirigid ureteroscope was then advanced alongside the wire up the left ureter and the stone was identified in the distal ureter somewhat impacted against the lateral wall.  The stone was grasped with an engage basket and I was able to  remove it intact without difficulty.  Repeat inspection after stone removal revealed some mucosal irritation but no other significant ureteral injury and the ureter proximal to the stone was quite well dilated.  The 11/13 French 36 cm digital access sheath was then passed over the wire into the proximal ureter and then a core and wire were then removed.  A dual-lumen digital flexible scope was then advanced through the sheath into the kidney and the calyceal system was inspected.  There was 1 stone approximately 3 mm in size adherent to a papilla in the upper pole but the remaining calcifications that had been seen on CT scan were all submucosal and Randall's plaques.  The 1 stone within the collecting system was then removed with the engage basket.  A sensor wire was then advanced back to the kidney through the sheath and the sheath was removed.  The cystoscope was reinserted over the wire and a 6 French by 26 cm contour double-J stent was advanced over the wire and fluoroscopic guidance.  The wire was removed, leaving a good coil in the kidney and a good coil in the bladder.  The bladder was drained the cystoscope was removed.  The stent string was left exiting the urethra and secured to the patient's penis.  He was taken down to lithotomy position, his anesthetic was reversed and he was moved to recovery in stable condition.  There were no complications.  The stones were given to the patient's family to bring to the office for analysis.

## 2024-07-10 ENCOUNTER — Encounter (HOSPITAL_COMMUNITY): Payer: Self-pay | Admitting: Urology

## 2024-07-17 ENCOUNTER — Ambulatory Visit: Payer: Self-pay | Admitting: Cardiology

## 2024-07-17 DIAGNOSIS — R42 Dizziness and giddiness: Secondary | ICD-10-CM | POA: Diagnosis not present

## 2024-07-23 DIAGNOSIS — N201 Calculus of ureter: Secondary | ICD-10-CM | POA: Diagnosis not present

## 2024-09-17 ENCOUNTER — Ambulatory Visit: Payer: Medicare Other | Admitting: Diagnostic Neuroimaging

## 2024-09-17 ENCOUNTER — Telehealth: Payer: Self-pay | Admitting: Diagnostic Neuroimaging

## 2024-09-17 NOTE — Telephone Encounter (Signed)
Office closed due to weather 

## 2024-10-30 ENCOUNTER — Ambulatory Visit: Admitting: Diagnostic Neuroimaging
